# Patient Record
Sex: Male | Born: 1955 | Race: White | Hispanic: No | Marital: Single | Smoking: Former smoker
Health system: Southern US, Community
[De-identification: ages and names within clinical notes are randomized; demographics above are authoritative.]

## PROBLEM LIST (undated history)

## (undated) DIAGNOSIS — E785 Hyperlipidemia, unspecified: Secondary | ICD-10-CM

## (undated) DIAGNOSIS — I2510 Atherosclerotic heart disease of native coronary artery without angina pectoris: Secondary | ICD-10-CM

## (undated) DIAGNOSIS — I471 Supraventricular tachycardia, unspecified: Secondary | ICD-10-CM

## (undated) DIAGNOSIS — I10 Essential (primary) hypertension: Secondary | ICD-10-CM

## (undated) DIAGNOSIS — I456 Pre-excitation syndrome: Secondary | ICD-10-CM

## (undated) DIAGNOSIS — I4719 Other supraventricular tachycardia: Secondary | ICD-10-CM

## (undated) DIAGNOSIS — J9611 Chronic respiratory failure with hypoxia: Secondary | ICD-10-CM

## (undated) DIAGNOSIS — Z9981 Dependence on supplemental oxygen: Secondary | ICD-10-CM

## (undated) DIAGNOSIS — J84112 Idiopathic pulmonary fibrosis: Secondary | ICD-10-CM

## (undated) DIAGNOSIS — E039 Hypothyroidism, unspecified: Secondary | ICD-10-CM

## (undated) DIAGNOSIS — I4891 Unspecified atrial fibrillation: Secondary | ICD-10-CM

## (undated) DIAGNOSIS — G4730 Sleep apnea, unspecified: Secondary | ICD-10-CM

## (undated) DIAGNOSIS — I4892 Unspecified atrial flutter: Secondary | ICD-10-CM

## (undated) DIAGNOSIS — J439 Emphysema, unspecified: Secondary | ICD-10-CM

## (undated) DIAGNOSIS — N183 Chronic kidney disease, stage 3 unspecified: Secondary | ICD-10-CM

## (undated) DIAGNOSIS — J939 Pneumothorax, unspecified: Secondary | ICD-10-CM

## (undated) DIAGNOSIS — J449 Chronic obstructive pulmonary disease, unspecified: Secondary | ICD-10-CM

## (undated) DIAGNOSIS — E119 Type 2 diabetes mellitus without complications: Secondary | ICD-10-CM

## (undated) DIAGNOSIS — I5031 Acute diastolic (congestive) heart failure: Secondary | ICD-10-CM

## (undated) DIAGNOSIS — J8410 Pulmonary fibrosis, unspecified: Secondary | ICD-10-CM

## (undated) DIAGNOSIS — I429 Cardiomyopathy, unspecified: Secondary | ICD-10-CM

## (undated) HISTORY — DX: Hyperlipidemia, unspecified: E78.5

## (undated) HISTORY — DX: Essential (primary) hypertension: I10

## (undated) HISTORY — PX: SHOULDER ARTHROSCOPY W/ ROTATOR CUFF REPAIR: SHX2400

## (undated) HISTORY — DX: Chronic obstructive pulmonary disease, unspecified: J44.9

## (undated) HISTORY — PX: BILATERAL VATS ABLATION: SHX1224

## (undated) HISTORY — PX: CORONARY ANGIOPLASTY WITH STENT PLACEMENT: SHX49

## (undated) HISTORY — DX: Pulmonary fibrosis, unspecified: J84.10

## (undated) HISTORY — PX: LUNG LOBECTOMY: SHX167

## (undated) HISTORY — DX: Pneumothorax, unspecified: J93.9

## (undated) HISTORY — DX: Atherosclerotic heart disease of native coronary artery without angina pectoris: I25.10

---

## 2000-02-21 ENCOUNTER — Encounter: Admission: RE | Admit: 2000-02-21 | Discharge: 2000-05-21 | Payer: Self-pay | Admitting: Internal Medicine

## 2000-07-19 ENCOUNTER — Encounter: Admission: RE | Admit: 2000-07-19 | Discharge: 2000-07-19 | Payer: Self-pay | Admitting: Oral Surgery

## 2000-07-19 ENCOUNTER — Encounter: Payer: Self-pay | Admitting: Oral Surgery

## 2000-07-23 ENCOUNTER — Ambulatory Visit (HOSPITAL_BASED_OUTPATIENT_CLINIC_OR_DEPARTMENT_OTHER): Admission: RE | Admit: 2000-07-23 | Discharge: 2000-07-23 | Payer: Self-pay | Admitting: Oral Surgery

## 2000-09-03 ENCOUNTER — Encounter (INDEPENDENT_AMBULATORY_CARE_PROVIDER_SITE_OTHER): Payer: Self-pay | Admitting: *Deleted

## 2000-09-03 ENCOUNTER — Ambulatory Visit (HOSPITAL_BASED_OUTPATIENT_CLINIC_OR_DEPARTMENT_OTHER): Admission: RE | Admit: 2000-09-03 | Discharge: 2000-09-03 | Payer: Self-pay | Admitting: Oral Surgery

## 2000-11-02 ENCOUNTER — Emergency Department (HOSPITAL_COMMUNITY): Admission: EM | Admit: 2000-11-02 | Discharge: 2000-11-02 | Payer: Self-pay | Admitting: Emergency Medicine

## 2000-11-02 ENCOUNTER — Encounter: Payer: Self-pay | Admitting: Emergency Medicine

## 2003-03-04 ENCOUNTER — Inpatient Hospital Stay (HOSPITAL_COMMUNITY): Admission: EM | Admit: 2003-03-04 | Discharge: 2003-03-06 | Payer: Self-pay | Admitting: Emergency Medicine

## 2003-03-04 ENCOUNTER — Encounter: Payer: Self-pay | Admitting: Cardiology

## 2003-03-04 ENCOUNTER — Encounter: Payer: Self-pay | Admitting: Cardiovascular Disease

## 2004-06-03 ENCOUNTER — Ambulatory Visit (HOSPITAL_COMMUNITY): Admission: RE | Admit: 2004-06-03 | Discharge: 2004-06-03 | Payer: Self-pay | Admitting: Orthopedic Surgery

## 2007-05-03 HISTORY — PX: CARDIAC CATHETERIZATION: SHX172

## 2007-05-08 ENCOUNTER — Inpatient Hospital Stay (HOSPITAL_COMMUNITY): Admission: RE | Admit: 2007-05-08 | Discharge: 2007-05-09 | Payer: Self-pay | Admitting: Cardiovascular Disease

## 2007-05-27 ENCOUNTER — Ambulatory Visit: Payer: Self-pay | Admitting: Critical Care Medicine

## 2007-05-28 ENCOUNTER — Encounter: Payer: Self-pay | Admitting: Critical Care Medicine

## 2007-05-28 LAB — CONVERTED CEMR LAB: Anti Nuclear Antibody(ANA): NEGATIVE

## 2007-06-14 ENCOUNTER — Ambulatory Visit: Payer: Self-pay | Admitting: Critical Care Medicine

## 2007-11-01 ENCOUNTER — Encounter: Payer: Self-pay | Admitting: Critical Care Medicine

## 2008-08-11 ENCOUNTER — Ambulatory Visit (HOSPITAL_BASED_OUTPATIENT_CLINIC_OR_DEPARTMENT_OTHER): Admission: RE | Admit: 2008-08-11 | Discharge: 2008-08-11 | Payer: Self-pay | Admitting: Orthopedic Surgery

## 2009-01-22 ENCOUNTER — Emergency Department (HOSPITAL_COMMUNITY): Admission: EM | Admit: 2009-01-22 | Discharge: 2009-01-22 | Payer: Self-pay | Admitting: Family Medicine

## 2010-01-07 ENCOUNTER — Encounter: Admission: RE | Admit: 2010-01-07 | Discharge: 2010-01-07 | Payer: Self-pay | Admitting: Cardiovascular Disease

## 2010-01-13 ENCOUNTER — Observation Stay (HOSPITAL_COMMUNITY): Admission: RE | Admit: 2010-01-13 | Discharge: 2010-01-14 | Payer: Self-pay | Admitting: Cardiovascular Disease

## 2010-01-21 DIAGNOSIS — J939 Pneumothorax, unspecified: Secondary | ICD-10-CM

## 2010-01-21 HISTORY — DX: Pneumothorax, unspecified: J93.9

## 2010-02-11 ENCOUNTER — Inpatient Hospital Stay (HOSPITAL_COMMUNITY): Admission: EM | Admit: 2010-02-11 | Discharge: 2010-02-14 | Payer: Self-pay | Admitting: Emergency Medicine

## 2010-02-11 ENCOUNTER — Ambulatory Visit: Payer: Self-pay | Admitting: Critical Care Medicine

## 2010-02-11 ENCOUNTER — Ambulatory Visit: Payer: Self-pay | Admitting: Thoracic Surgery

## 2010-02-22 ENCOUNTER — Ambulatory Visit: Payer: Self-pay | Admitting: Thoracic Surgery

## 2010-02-22 ENCOUNTER — Encounter: Admission: RE | Admit: 2010-02-22 | Discharge: 2010-02-22 | Payer: Self-pay | Admitting: Thoracic Surgery

## 2010-02-22 DIAGNOSIS — J479 Bronchiectasis, uncomplicated: Secondary | ICD-10-CM

## 2010-02-22 DIAGNOSIS — I10 Essential (primary) hypertension: Secondary | ICD-10-CM

## 2010-02-22 DIAGNOSIS — J8410 Pulmonary fibrosis, unspecified: Secondary | ICD-10-CM | POA: Insufficient documentation

## 2010-02-22 DIAGNOSIS — E785 Hyperlipidemia, unspecified: Secondary | ICD-10-CM

## 2010-02-23 ENCOUNTER — Ambulatory Visit: Payer: Self-pay | Admitting: Internal Medicine

## 2010-02-23 DIAGNOSIS — E119 Type 2 diabetes mellitus without complications: Secondary | ICD-10-CM

## 2010-02-23 DIAGNOSIS — J449 Chronic obstructive pulmonary disease, unspecified: Secondary | ICD-10-CM

## 2010-03-06 ENCOUNTER — Inpatient Hospital Stay (HOSPITAL_COMMUNITY): Admission: EM | Admit: 2010-03-06 | Discharge: 2010-03-08 | Payer: Self-pay | Admitting: Emergency Medicine

## 2010-03-07 ENCOUNTER — Encounter (INDEPENDENT_AMBULATORY_CARE_PROVIDER_SITE_OTHER): Payer: Self-pay | Admitting: Internal Medicine

## 2010-03-13 ENCOUNTER — Ambulatory Visit: Payer: Self-pay | Admitting: Thoracic Surgery (Cardiothoracic Vascular Surgery)

## 2010-03-13 ENCOUNTER — Inpatient Hospital Stay (HOSPITAL_COMMUNITY): Admission: EM | Admit: 2010-03-13 | Discharge: 2010-03-22 | Payer: Self-pay | Admitting: Emergency Medicine

## 2010-03-15 ENCOUNTER — Encounter: Payer: Self-pay | Admitting: Thoracic Surgery

## 2010-03-25 ENCOUNTER — Ambulatory Visit: Payer: Self-pay | Admitting: Thoracic Surgery

## 2010-03-25 ENCOUNTER — Encounter: Payer: Self-pay | Admitting: Internal Medicine

## 2010-03-25 ENCOUNTER — Encounter: Admission: RE | Admit: 2010-03-25 | Discharge: 2010-03-25 | Payer: Self-pay | Admitting: Thoracic Surgery

## 2010-04-07 ENCOUNTER — Ambulatory Visit: Payer: Self-pay | Admitting: Internal Medicine

## 2010-04-15 ENCOUNTER — Encounter: Admission: RE | Admit: 2010-04-15 | Discharge: 2010-04-15 | Payer: Self-pay | Admitting: Thoracic Surgery

## 2010-04-15 ENCOUNTER — Ambulatory Visit: Payer: Self-pay | Admitting: Thoracic Surgery

## 2010-06-01 ENCOUNTER — Ambulatory Visit: Payer: Self-pay | Admitting: Thoracic Surgery

## 2010-06-01 ENCOUNTER — Encounter: Admission: RE | Admit: 2010-06-01 | Discharge: 2010-06-01 | Payer: Self-pay | Admitting: Thoracic Surgery

## 2010-06-01 ENCOUNTER — Encounter: Payer: Self-pay | Admitting: Internal Medicine

## 2010-06-08 ENCOUNTER — Ambulatory Visit: Payer: Self-pay | Admitting: Internal Medicine

## 2010-10-23 HISTORY — PX: KNEE SURGERY: SHX244

## 2010-11-13 ENCOUNTER — Encounter: Payer: Self-pay | Admitting: Thoracic Surgery

## 2010-11-24 NOTE — Letter (Signed)
Summary: [facility]  [facility]   Imported By: Lester Sandwich 06/15/2010 10:15:16  _____________________________________________________________________  External Attachment:    Type:   Image     Comment:   External Document

## 2010-11-24 NOTE — Letter (Signed)
Summary: [facility]  [facility]   Imported By: Lester Boyd 04/18/2010 10:57:31  _____________________________________________________________________  External Attachment:    Type:   Image     Comment:   External Document

## 2010-11-24 NOTE — Assessment & Plan Note (Signed)
Summary: Pulmonary/ f/u ov with walking sats = 86% p 3 laps   Visit Type:  Follow-up  CC:  Patient here for follow-up...no complaints today.Jerry Montgomery  History of Present Illness: 55 yowm quit smoking Nov 2009 with some am cough resolved and new R PTX 01/2010   April 07, 2010 Followup with PFT's. s/p admit  [facility] with PNA and recurrent  pneumothorax.  He states that his breathing has been doing fine since then but not back to baseline assoc with coughing fits and frequent need for albuterol assoc with mild hoarseness and sensation of congested throat with noisy wheezing while sleeping per wife on chronic ACE per [name].   rec  stop ramapril start losartan 100mg  one daily in place of ramipril use mucinex if needed for cough use albuterol if needed for short of breath  June 08, 2010 main issue now is energy, not using any albuterol.  cough resolved off ace.  Pt denies any significant sore throat, dysphagia, itching, sneezing,  nasal congestion or excess secretions,  fever, chills, sweats, unintended wt loss, pleuritic or exertional cp, hempoptysis, change in activity tolerance  orthopnea pnd or leg swelling.  Pt also denies any obvious fluctuation in symptoms with weather or environmental change or other alleviating or aggravating factors.        Current Medications (verified): 1)  Lantus 100 Unit/ml Soln (Insulin Glargine) .... 40 Units Subcutaneously At Bedtime 2)  Spiriva Handihaler 18 Mcg Caps (Tiotropium Bromide Monohydrate) .... Once Daily 3)  Aspir-Low 81 Mg Tbec (Aspirin) .... Once Daily 4)  Glyburide 5 Mg Tabs (Glyburide) .... Two Times A Day 5)  Isosorbide Mononitrate Cr 60 Mg Xr24h-Tab (Isosorbide Mononitrate) .... Once Daily 6)  Lipitor 40 Mg Tabs (Atorvastatin Calcium) .... Once Daily 7)  Metoprolol Succinate 200 Mg Xr24h-Tab (Metoprolol Succinate) .... Once Daily 8)  Niaspan 1000 Mg Cr-Tabs (Niacin (Antihyperlipidemic)) .... Once Daily 9)  Plavix 75 Mg Tabs (Clopidogrel Bisulfate) ....  Once Daily 10)  Metformin Hcl 500 Mg Tabs (Metformin Hcl) .... Two Times A Day 11)  Nitrostat 0.4 Mg Subl (Nitroglycerin) .... As Needed 12)  Proventil Hfa 108 (90 Base) Mcg/act Aers (Albuterol Sulfate) .... 2 Puffs Every 4 Hours 13)  Cozaar 100 Mg  Tabs (Losartan Potassium) .... One Tablet By Mouth Daily  Allergies (verified): 1)  ! Lortab  Past History:  Past Medical History: DIABETES MELLITUS, TYPE II (ICD-250.00) COPD (ICD-496)     - PFT's April 07, 2010 FEV1 2.56 (68%) ratio 72 no better B2, DLC0 45 corrects to 80%, flat insp loop     - desat p 3 laps June 08, 2010   HYPERTENSION (ICD-401.9)      - Try off ace April 07, 2010 due to pseudowheeze >  HYPERLIPIDEMIA (ICD-272.4) PULMONARY FIBROSIS (ICD-515) CAD (ICD-414.00)......................................Jerry KitchenNicholaus Bloom    - LHC 02/14/10 mild lv dysfunction > mutilvessel coronary artery disease with new stent cx R PTX  02/11/10    - CT chest 423/11 Upper lobe predominant subpleural honeycombing  with patch bilateral gg densities and rul bulla    - Mar 16 2010   Right video-assisted thoracoscopic surgery,   minithoracotomy, resection of apical bullae, pleurectomy, and   pleurodesis  >  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      Vital Signs:  Patient profile:   55 year old male Height:      74 inches (187.96 cm) Weight:      252 pounds (114.55 kg) BMI:     32.47 O2 Sat:  92 % on Room air Temp:     98.1 degrees F (36.72 degrees C) oral Pulse rate:   72 / minute BP sitting:   100 / 62  (left arm) Cuff size:   large  Vitals Entered By: Michel Bickers CMA (June 08, 2010 9:32 AM)  O2 Sat at Rest %:  92 O2 Flow:  Room air  Serial Vital Signs/Assessments:  Comments: 9:57 AM Ambulatory Pulse Oximetry  Resting; HR_47___    02 Sat___90% on room air__  Lap1 (185 feet)   HR_97____   02 Sat__90% on room air___ Lap2 (185 feet)   HR__85___   02 Sat__89% on room air___    Lap3 (185 feet)   HR__103___   02 Sat__86%  on room air___  _x__Test Completed without Difficulty  patient did not feel short of breath, but sats dropped to 86% on room sir after walking 3 laps.[name] placed on 2 liters of oxygen and his sats recovered to 93% and pulse was 55. ___Test Stopped due to:  By: Michel Bickers CMA   CC: Patient here for follow-up...no complaints today. Comments Medications reviewed. Daytime phone verified. Michel Bickers [facility]  June 08, 2010 9:33 AM   Physical Exam  Additional Exam:  wt 259 Feb 23, 2010 > 248 April 07, 2010 > 252 June 08, 2010   amb wm no longer  pseudowheeze  HEENT mild turbinate edema.  Oropharynx no thrush or excess pnd or cobblestoning.  No JVD or cervical adenopathy. Mild accessory muscle hypertrophy. Trachea midline, nl thryroid. Chest was hyperinflated by percussion with diminished breath sounds and moderate increased exp time without wheeze. Hoover sign positive at mid inspiration. Regular rate and rhythm without murmur gallop or rub or increase P2 or edema.  Abd: no hsm, nl excursion. Ext warm without cyanosis or clubbing.     CXR  Procedure date:  06/01/2010  Findings:      Stable chronic interstitial lung disease. No active process   Impression & Recommendations:  Problem # 1:  COPD (ICD-496) Barely detectable copd changes on pfts doubt needs any maint pulmonary rx - try off spiriva, the reverse of a therapeutic trial  From this point onward his prognosis will likely be determined by whether he smokes or not,  not what meds he takes or doctors he sees, so pulmonary f/u can be as needed increase symptoms  Problem # 2:  HYPERTENSION (ICD-401.9)  over treated on 100 of cozaar, try one half  His updated medication list for this problem includes:    Metoprolol Succinate 200 Mg Xr24h-tab (Metoprolol succinate) ..... Once daily    Cozaar 100 Mg Tabs (Losartan potassium) ..... One tablet by mouth daily  Many of his symptoms improved off ACE.   ACE inhibitors are  problematic in  pts with airway complaints because  even experienced pulmonologists can't always distinguish ace effects from copd/asthma.  By themselves they don't actually cause a problem, much like oxygen can't by itself start a fire, but they certainly serve as a powerful catalyst or enhancer for any "fire"  or inflammatory process in the upper airway, be it caused by an ET  tube or more commonly reflux (especially in the obese or pts with known GERD or who are on biphoshonates).  In his case I would avoid ACE indefinitely based on how much better he's doing off them and how much his copd symptoms overlap with ace effects  Orders: Est. Patient Level IV (95621)  Problem # 3:  PULMONARY FIBROSIS (ICD-515) Desats p 3 laps but not symptoms, no need for rx unless becomes symptomatic- can't rule out early form of IPF here but more than likely this was smoking related PF which should not progress and for which there is no early opportunity for management other than to reinforce smoking cessation  Medications Added to Medication List This Visit: 1)  Proventil Hfa 108 (90 Base) Mcg/act Aers (Albuterol sulfate) .... 2 puffs every 4 hours as needed  Complete Medication List: 1)  Lantus 100 Unit/ml Soln (Insulin glargine) .... 40 units subcutaneously at bedtime 2)  Spiriva Handihaler 18 Mcg Caps (Tiotropium bromide monohydrate) .... Once daily 3)  Aspir-low 81 Mg Tbec (Aspirin) .... Once daily 4)  Glyburide 5 Mg Tabs (Glyburide) .... Two times a day 5)  Isosorbide Mononitrate Cr 60 Mg Xr24h-tab (Isosorbide mononitrate) .... Once daily 6)  Lipitor 40 Mg Tabs (Atorvastatin calcium) .... Once daily 7)  Metoprolol Succinate 200 Mg Xr24h-tab (Metoprolol succinate) .... Once daily 8)  Niaspan 1000 Mg Cr-tabs (Niacin (antihyperlipidemic)) .... Once daily 9)  Plavix 75 Mg Tabs (Clopidogrel bisulfate) .... Once daily 10)  Metformin Hcl 500 Mg Tabs (Metformin hcl) .... Two times a day 11)  Cozaar 100 Mg Tabs  (Losartan potassium) .... One tablet by mouth daily 12)  Nitrostat 0.4 Mg Subl (Nitroglycerin) .... As needed 13)  Proventil Hfa 108 (90 Base) Mcg/act Aers (Albuterol sulfate) .... 2 puffs every 4 hours as needed  Other Orders: Est. Patient Level III (04540) Pulse Oximetry, Ambulatory (98119)  Patient Instructions: 1)  I think of spiriva in this setting like purchasing high octane fuel for an older car with lots of miles on it. It may help the perfomance enough to warrant the purchase, but it won't change the longevity of the car or make it any easier parking it. It should improve peak performance - try off spiriva to see if it makes a difference  2)   losartan 100mg  one half daily 3)  use  mucinex if needed for cough 4)  use albuterol if needed for short of breath 5)  Return here if needed for any worsening short of breath

## 2010-11-24 NOTE — Miscellaneous (Signed)
Summary: Orders Update pft charges  Clinical Lists Changes  Orders: Added new Service order of Carbon Monoxide diffusing w/capacity (94720) - Signed Added new Service order of Lung Volumes (94240) - Signed Added new Service order of Spirometry (Pre & Post) (94060) - Signed 

## 2010-11-24 NOTE — Assessment & Plan Note (Signed)
Summary: Pulmonary/ ext post op f/u ov - try off ace    CC:  Followup with PFT's.  Pt states that since last seen he was admitted to [facility] with PNA and pneumothorax.  He states that his breathing has been doing fine since then.  No complaints today.Marland Kitchen  History of Present Illness: 59 yowm quit smoking Nov 2009 with some am cough resolved and new R PTX 01/2010   April 07, 2010 Followup with PFT's. s/p admit  [facility] with PNA and recurrent  pneumothorax.  He states that his breathing has been doing fine since then but not back to baseline assoc with coughing fits and frequent need for albuterol assoc with mild hoarseness and sensation of congested throat with noisy wheezing while sleeping per wife on chronic ACE per [name].  Pt denies any significant sore throat, dysphagia, itching, sneezing,  nasal congestion or excess secretions,  fever, chills, sweats, unintended wt loss, pleuritic or exertional cp, hempoptysis, change in poor activity tolerance  orthopnea pnd or leg swelling. Pt also denies any obvious fluctuation in symptoms with weather or environmental change or other alleviating or aggravating factors.       Current Medications (verified): 1)  Lantus 100 Unit/ml Soln (Insulin Glargine) .... 40 Units Subcutaneously At Bedtime 2)  Spiriva Handihaler 18 Mcg Caps (Tiotropium Bromide Monohydrate) .... Once Daily 3)  Aspir-Low 81 Mg Tbec (Aspirin) .... Once Daily 4)  Fish Oil 1000 Mg Caps (Omega-3 Fatty Acids) .... Once Daily 5)  Glyburide 5 Mg Tabs (Glyburide) .... Two Times A Day 6)  Isosorbide Mononitrate Cr 60 Mg Xr24h-Tab (Isosorbide Mononitrate) .... Once Daily 7)  Lipitor 40 Mg Tabs (Atorvastatin Calcium) .... Once Daily 8)  Metoprolol Succinate 200 Mg Xr24h-Tab (Metoprolol Succinate) .... Once Daily 9)  Niaspan 1000 Mg Cr-Tabs (Niacin (Antihyperlipidemic)) .... Once Daily 10)  Plavix 75 Mg Tabs (Clopidogrel Bisulfate) .... Once Daily 11)  Ramipril 10 Mg Caps (Ramipril) .... Once Daily 12)   Metformin Hcl 500 Mg Tabs (Metformin Hcl) .... Two Times A Day 13)  Nitrostat 0.4 Mg Subl (Nitroglycerin) .... As Needed 14)  Proventil Hfa 108 (90 Base) Mcg/act Aers (Albuterol Sulfate) .... 2 Puffs Every 4 Hours  Allergies (verified): 1)  ! Lortab  Past History:  Past Medical History: DIABETES MELLITUS, TYPE II (ICD-250.00) COPD (ICD-496)     - PFT's April 07, 2010 FEV1 2.56 (68%) ratio 72 no better B2, DLC0 45 corrects to 80%, flat insp loop  HYPERTENSION (ICD-401.9)      - Try off ace April 07, 2010 due to pseudowheeze  HYPERLIPIDEMIA (ICD-272.4) PULMONARY FIBROSIS (ICD-515) CAD (ICD-414.00)......................................Marland KitchenNicholaus Bloom    - LHC 02/14/10 mild lv dysfunction > mutilvessel coronary artery disease with new stent cx R PTX  02/11/10    - CT chest 423/11 Upper lobe predominant subpleural honeycombing  with patch bilateral gg densities and rul bulla    - Mar 16 2010   Right video-assisted thoracoscopic surgery,   minithoracotomy, resection of apical bullae, pleurectomy, and   pleurodesis.      Past Surgical History: 4 stents right elbow surgery 2 rotator cuff repair Mar 16 2010   Right video-assisted thoracoscopic surgery,   minithoracotomy, resection of apical bullae, pleurectomy, and   pleurodesis.      Vital Signs:  Patient profile:   55 year old male Weight:      248 pounds O2 Sat:      94 % on Room air Temp:     97.4 degrees F oral Pulse  rate:   46 / minute BP sitting:   110 / 68  (left arm)  Vitals Entered By: Vernie Murders (April 07, 2010 11:45 AM)  O2 Flow:  Room air  Physical Exam  Additional Exam:  wt 259 Feb 23, 2010 > 248 April 07, 2010 hoarse amb wm with classic pseudowheeze resolves with purse lip maneuver  HEENT mild turbinate edema.  Oropharynx no thrush or excess pnd or cobblestoning.  No JVD or cervical adenopathy. Mild accessory muscle hypertrophy. Trachea midline, nl thryroid. Chest was hyperinflated by percussion with diminished breath  sounds and moderate increased exp time without wheeze. Hoover sign positive at mid inspiration. Regular rate and rhythm without murmur gallop or rub or increase P2 or edema.  Abd: no hsm, nl excursion. Ext warm without cyanosis or clubbing.     Impression & Recommendations:  Problem # 1:  COPD (ICD-496) Barely detectable copd changes on pfts are markedly disproportionate to symptoms and need for multiple pulmonary meds    DDX of  difficult airways managment all start with A and  include Adherence, Ace Inhibitors, Acid Reflux, Active Sinus Disease, Alpha 1 Antitripsin deficiency, Anxiety masquerading as Airways dz,  ABPA,  allergy(esp in young), Aspiration (esp in elderly), Adverse effects of DPI,  Active smokers, plus one B  = Beta blocker use..   Ace inhibitors or Acid reflux leading suspects here based on insp portion of FV loop and pseudowheeze on exam  First step is try off ace, then regroup ( may not even need spiriva)  Problem # 2:  HYPERTENSION (ICD-401.9)  The following medications were removed from the medication list:    Furosemide 20 Mg Tabs (Furosemide) ..... Once daily    Ramipril 10 Mg Caps (Ramipril) ..... Once daily His updated medication list for this problem includes:    Metoprolol Succinate 200 Mg Xr24h-tab (Metoprolol succinate) ..... Once daily    Cozaar 100 Mg Tabs (Losartan potassium) ..... One tablet by mouth daily   ACE inhibitors are problematic in  pts with airway complaints because  even experienced pulmonologists can't always distinguish ace effects from copd/asthma.  By themselves they don't actually cause a problem, much like oxygen can't by itself start a fire, but they certainly serve as a powerful catalyst or enhancer for any "fire"  or inflammatory process in the upper airway, be it caused by an ET  tube or more commonly reflux (especially in the obese or pts with known GERD or who are on biphoshonates).  In the era of ARB near equivalency until we have a  better handle on the reversibility of the airway problem, it just makes sense to avoid ace entirely in the short run and then decide later, having established a level of airway control using a reasonable limited regimen, whether to add back ace but even then being very careful to observe the pt for worsening airway control and number of meds used/ needed to control symptoms.    Orders: Est. Patient Level IV (16109)  Medications Added to Medication List This Visit: 1)  Proventil Hfa 108 (90 Base) Mcg/act Aers (Albuterol sulfate) .... 2 puffs every 4 hours 2)  Cozaar 100 Mg Tabs (Losartan potassium) .... One tablet by mouth daily  Patient Instructions: 1)  I think of spiriva in this setting like purchasing high octane fuel for an older car with lots of miles on it. It may help the perfomance enough to warrant the purchase, but it won't change the longevity of the car  or make it any easier parking it. It should improve peak performance 2)  stop ramapril 3)  start losartan 100mg  one daily in place of ramipril 4)  use mucinex if needed for cough 5)  use albuterol if needed for short of breath Prescriptions: COZAAR 100 MG  TABS (LOSARTAN POTASSIUM) One tablet by mouth daily  #34 x 11   Entered and Authorized by:   Nyoka Cowden MD   Signed by:   Nyoka Cowden MD on 04/07/2010   Method used:   Electronically to        [facility] [phone]* (retail)       [address]       [place], [place]  [address]       Ph: [phone]       Fax: [phone]   RxID:   [id number]

## 2010-11-24 NOTE — Assessment & Plan Note (Signed)
Summary: Pulmonary/ ext post hosp f/u     CC:  HFU.  Pt states breathing is doing "fine" since discharge.  Denies wheezing and chest tightness and SOB.  States he does have a dry cough..  History of Present Illness: 57 yowm quit smoking Nov 2009 with some am cough resolved and new R PTX 01/2010  Feb 23, 2010 cc  "fine" since discharge.  Denies wheezing, chest tightness and SOB.  States he does have a dry cough. R PTX treated and released by [facility] after May 31st f/u  no limitations, no cough. Pt denies any significant sore throat, dysphagia, itching, sneezing,  nasal congestion or excess secretions,  fever, chills, sweats, unintended wt loss, pleuritic or exertional cp, hempoptysis, change in activity tolerance  orthopnea pnd or leg swelling Pt also denies any obvious fluctuation in symptoms with weather or environmental change or other alleviating or aggravating factors.        Preventive Screening-Counseling & Management  Alcohol-Tobacco     Smoking Status: quit  Current Medications (verified): 1)  Lantus 100 Unit/ml Soln (Insulin Glargine) .... 40 Units Subcutaneously At Bedtime 2)  Spiriva Handihaler 18 Mcg Caps (Tiotropium Bromide Monohydrate) .... Once Daily 3)  Actos 30 Mg Tabs (Pioglitazone Hcl) .... Once Daily 4)  Aspir-Low 81 Mg Tbec (Aspirin) .... Once Daily 5)  Fish Oil 1000 Mg Caps (Omega-3 Fatty Acids) .... Once Daily 6)  Furosemide 20 Mg Tabs (Furosemide) .... Once Daily 7)  Glyburide 5 Mg Tabs (Glyburide) .... Two Times A Day 8)  Isosorbide Mononitrate Cr 60 Mg Xr24h-Tab (Isosorbide Mononitrate) .... Once Daily 9)  Lipitor 40 Mg Tabs (Atorvastatin Calcium) .... Once Daily 10)  Metoprolol Succinate 200 Mg Xr24h-Tab (Metoprolol Succinate) .... Once Daily 11)  Niaspan 1000 Mg Cr-Tabs (Niacin (Antihyperlipidemic)) .... Once Daily 12)  Nitrostat 0.4 Mg Subl (Nitroglycerin) .... As Needed 13)  Plavix 75 Mg Tabs (Clopidogrel Bisulfate) .... Once Daily 14)  Ramipril 10 Mg Caps  (Ramipril) .... Once Daily 15)  Tramadol Hcl 50 Mg Tabs (Tramadol Hcl) .... Every 6 Hours As Needed 16)  Metformin Hcl 500 Mg Tabs (Metformin Hcl) .... Two Times A Day  Allergies (verified): No Known Drug Allergies  Past History:  Past Medical History: DIABETES MELLITUS, TYPE II (ICD-250.00) COPD (ICD-496)  HYPERTENSION (ICD-401.9) HYPERLIPIDEMIA (ICD-272.4) PULMONARY FIBROSIS (ICD-515) CAD (ICD-414.00)......................................Marland KitchenNicholaus Bloom    - LHC 02/14/10 mild lv dysfunction > mutilvessel coronary artery disease with new stent cx R PTX  02/11/10    - CT chest 423/11 Upper lobe predominant subpleural honeycombing  with patch bilateral gg densities and rul bulla  Past Surgical History: 4 stents right elbow surgery 2 rotator cuff repair  Family History: breast cancer - mother father deceased from lung ca DM - mother  Social History: Patient states former smoker.  Quit 2009.  1 1/2 ppd x 35 yrs alcohol socially [employer] Married 1 daughter living, 1 son deceasedSmoking Status:  quit  Vital Signs:  Patient profile:   55 year old male Height:      74 inches Weight:      259 pounds BMI:     33.37 O2 Sat:      93 % on Room air Temp:     97.5 degrees F oral Pulse rate:   56 / minute BP sitting:   94 / 60  (left arm) Cuff size:   large  Vitals Entered By: Gweneth Dimitri RN (Feb 23, 2010 11:10 AM)  O2 Flow:  Room air  CC: HFU.  Pt states breathing is doing "fine" since discharge.  Denies wheezing, chest tightness and SOB.  States he does have a dry cough. Comments Medications reviewed with patient Daytime contact number verified with patient. Gweneth Dimitri RN  Feb 23, 2010 11:11 AM    Physical Exam  Additional Exam:  wt 259 Feb 23, 2010  HEENT mild turbinate edema.  Oropharynx no thrush or excess pnd or cobblestoning.  No JVD or cervical adenopathy. Mild accessory muscle hypertrophy. Trachea midline, nl thryroid. Chest was hyperinflated by percussion with  diminished breath sounds and moderate increased exp time without wheeze. Hoover sign positive at mid inspiration. Regular rate and rhythm without murmur gallop or rub or increase P2 or edema.  Abd: no hsm, nl excursion. Ext warm without cyanosis or clubbing.     Impression & Recommendations:  Problem # 1:  COPD (ICD-496) Rx is based on symptoms and possible need to control exac and tendency to recurrent ptx; spiriva adequate for this  I spent extra time with the patient today explaining optimal dpi  technique.  This improved from 75 -90%   Problem # 2:  PULMONARY FIBROSIS (ICD-515) No evidence bronchiectasis by the only ct scan he has on record in e chart  DDx for pulmonary fibrosis with honeycombing includes idiopathic pulmonary fibrosis, pulmonary fibrosis associated with rheumatologic disease, adverse effect from  drugs such as chemotherapy or amiodarone exposure, nonspecific interstitial pneumonia which is typically steroid responsive, and chronic hypersensitivity pneumonitis.   Needs f/u pft's and careful serial f/u  Discussed in detail all the  indications, usual  risks and alternatives  relative to the benefits with patient who agrees to proceed with serial f/u  Medications Added to Medication List This Visit: 1)  Lantus 100 Unit/ml Soln (Insulin glargine) .... 40 units subcutaneously at bedtime 2)  Spiriva Handihaler 18 Mcg Caps (Tiotropium bromide monohydrate) .... Once daily 3)  Actos 30 Mg Tabs (Pioglitazone hcl) .... Once daily 4)  Aspir-low 81 Mg Tbec (Aspirin) .... Once daily 5)  Fish Oil 1000 Mg Caps (Omega-3 fatty acids) .... Once daily 6)  Furosemide 20 Mg Tabs (Furosemide) .... Once daily 7)  Glyburide 5 Mg Tabs (Glyburide) .... Two times a day 8)  Isosorbide Mononitrate Cr 60 Mg Xr24h-tab (Isosorbide mononitrate) .... Once daily 9)  Lipitor 40 Mg Tabs (Atorvastatin calcium) .... Once daily 10)  Metoprolol Succinate 200 Mg Xr24h-tab (Metoprolol succinate) .... Once  daily 11)  Niaspan 1000 Mg Cr-tabs (Niacin (antihyperlipidemic)) .... Once daily 12)  Plavix 75 Mg Tabs (Clopidogrel bisulfate) .... Once daily 13)  Ramipril 10 Mg Caps (Ramipril) .... Once daily 14)  Metformin Hcl 500 Mg Tabs (Metformin hcl) .... Two times a day 15)  Tramadol Hcl 50 Mg Tabs (Tramadol hcl) .... Every 6 hours as needed 16)  Nitrostat 0.4 Mg Subl (Nitroglycerin) .... As needed  Other Orders: Est. Patient Level IV (16109) HFA Instruction [phone])  Patient Instructions: 1)  Please schedule a follow-up appointment in 6 weeks, sooner if needed with PFT's  Prescriptions: SPIRIVA HANDIHALER 18 MCG CAPS (TIOTROPIUM BROMIDE MONOHYDRATE) once daily  #90 x 3   Entered and Authorized by:   Nyoka Cowden MD   Signed by:   Nyoka Cowden MD on 02/23/2010   Method used:   Print then Give to Patient   RxID:   [id number]

## 2010-11-28 ENCOUNTER — Encounter: Payer: Self-pay | Admitting: Critical Care Medicine

## 2011-01-09 LAB — PROTIME-INR
INR: 1.08 (ref 0.00–1.49)
Prothrombin Time: 13.9 seconds (ref 11.6–15.2)

## 2011-01-09 LAB — BASIC METABOLIC PANEL WITH GFR
BUN: 10 mg/dL (ref 6–23)
BUN: 15 mg/dL (ref 6–23)
CO2: 25 meq/L (ref 19–32)
CO2: 25 meq/L (ref 19–32)
Calcium: 7.9 mg/dL — ABNORMAL LOW (ref 8.4–10.5)
Calcium: 8.6 mg/dL (ref 8.4–10.5)
Chloride: 103 meq/L (ref 96–112)
Chloride: 105 meq/L (ref 96–112)
Creatinine, Ser: 1.04 mg/dL (ref 0.4–1.5)
Creatinine, Ser: 1.12 mg/dL (ref 0.4–1.5)
GFR calc non Af Amer: >60 mL/min
GFR calc non Af Amer: >60 mL/min
Glucose, Bld: 107 mg/dL — ABNORMAL HIGH (ref 70–99)
Glucose, Bld: 161 mg/dL — ABNORMAL HIGH (ref 70–99)
Potassium: 3.3 meq/L — ABNORMAL LOW (ref 3.5–5.1)
Potassium: 4.3 meq/L (ref 3.5–5.1)
Sodium: 133 meq/L — ABNORMAL LOW (ref 135–145)
Sodium: 140 meq/L (ref 135–145)

## 2011-01-09 LAB — GLUCOSE, CAPILLARY
Glucose-Capillary: 106 mg/dL — ABNORMAL HIGH (ref 70–99)
Glucose-Capillary: 107 mg/dL — ABNORMAL HIGH (ref 70–99)
Glucose-Capillary: 112 mg/dL — ABNORMAL HIGH (ref 70–99)
Glucose-Capillary: 113 mg/dL — ABNORMAL HIGH (ref 70–99)
Glucose-Capillary: 148 mg/dL — ABNORMAL HIGH (ref 70–99)
Glucose-Capillary: 148 mg/dL — ABNORMAL HIGH (ref 70–99)
Glucose-Capillary: 150 mg/dL — ABNORMAL HIGH (ref 70–99)
Glucose-Capillary: 158 mg/dL — ABNORMAL HIGH (ref 70–99)
Glucose-Capillary: 168 mg/dL — ABNORMAL HIGH (ref 70–99)
Glucose-Capillary: 170 mg/dL — ABNORMAL HIGH (ref 70–99)
Glucose-Capillary: 175 mg/dL — ABNORMAL HIGH (ref 70–99)
Glucose-Capillary: 184 mg/dL — ABNORMAL HIGH (ref 70–99)
Glucose-Capillary: 186 mg/dL — ABNORMAL HIGH (ref 70–99)
Glucose-Capillary: 187 mg/dL — ABNORMAL HIGH (ref 70–99)
Glucose-Capillary: 190 mg/dL — ABNORMAL HIGH (ref 70–99)
Glucose-Capillary: 192 mg/dL — ABNORMAL HIGH (ref 70–99)
Glucose-Capillary: 196 mg/dL — ABNORMAL HIGH (ref 70–99)
Glucose-Capillary: 197 mg/dL — ABNORMAL HIGH (ref 70–99)
Glucose-Capillary: 198 mg/dL — ABNORMAL HIGH (ref 70–99)
Glucose-Capillary: 199 mg/dL — ABNORMAL HIGH (ref 70–99)
Glucose-Capillary: 200 mg/dL — ABNORMAL HIGH (ref 70–99)
Glucose-Capillary: 204 mg/dL — ABNORMAL HIGH (ref 70–99)
Glucose-Capillary: 221 mg/dL — ABNORMAL HIGH (ref 70–99)
Glucose-Capillary: 229 mg/dL — ABNORMAL HIGH (ref 70–99)
Glucose-Capillary: 244 mg/dL — ABNORMAL HIGH (ref 70–99)
Glucose-Capillary: 258 mg/dL — ABNORMAL HIGH (ref 70–99)
Glucose-Capillary: 307 mg/dL — ABNORMAL HIGH (ref 70–99)

## 2011-01-09 LAB — CBC
HCT: 30.4 % — ABNORMAL LOW (ref 39.0–52.0)
HCT: 36.8 % — ABNORMAL LOW (ref 39.0–52.0)
HCT: 40 % (ref 39.0–52.0)
HCT: 40 % (ref 39.0–52.0)
HCT: 40.3 % (ref 39.0–52.0)
Hemoglobin: 10.4 g/dL — ABNORMAL LOW (ref 13.0–17.0)
Hemoglobin: 10.8 g/dL — ABNORMAL LOW (ref 13.0–17.0)
Hemoglobin: 11.7 g/dL — ABNORMAL LOW (ref 13.0–17.0)
Hemoglobin: 12.7 g/dL — ABNORMAL LOW (ref 13.0–17.0)
Hemoglobin: 13.9 g/dL (ref 13.0–17.0)
Hemoglobin: 14.1 g/dL (ref 13.0–17.0)
MCHC: 34.3 g/dL (ref 30.0–36.0)
MCHC: 34.5 g/dL (ref 30.0–36.0)
MCHC: 34.5 g/dL (ref 30.0–36.0)
MCHC: 34.6 g/dL (ref 30.0–36.0)
MCHC: 34.8 g/dL (ref 30.0–36.0)
MCHC: 35 g/dL (ref 30.0–36.0)
MCV: 92.7 fL (ref 78.0–100.0)
MCV: 92.8 fL (ref 78.0–100.0)
MCV: 93.1 fL (ref 78.0–100.0)
MCV: 93.3 fL (ref 78.0–100.0)
MCV: 93.8 fL (ref 78.0–100.0)
MCV: 94 fL (ref 78.0–100.0)
Platelets: 138 K/uL — ABNORMAL LOW (ref 150–400)
Platelets: 139 10*3/uL — ABNORMAL LOW (ref 150–400)
Platelets: 139 10*3/uL — ABNORMAL LOW (ref 150–400)
Platelets: 139 K/uL — ABNORMAL LOW (ref 150–400)
Platelets: 146 10*3/uL — ABNORMAL LOW (ref 150–400)
Platelets: 210 10*3/uL (ref 150–400)
RBC: 3.26 MIL/uL — ABNORMAL LOW (ref 4.22–5.81)
RBC: 3.36 MIL/uL — ABNORMAL LOW (ref 4.22–5.81)
RBC: 3.97 MIL/uL — ABNORMAL LOW (ref 4.22–5.81)
RBC: 4.26 MIL/uL (ref 4.22–5.81)
RBC: 4.3 MIL/uL (ref 4.22–5.81)
RBC: 4.35 MIL/uL (ref 4.22–5.81)
RBC: 4.37 MIL/uL (ref 4.22–5.81)
RDW: 12.6 % (ref 11.5–15.5)
RDW: 12.7 % (ref 11.5–15.5)
RDW: 12.9 % (ref 11.5–15.5)
RDW: 13.2 % (ref 11.5–15.5)
WBC: 4 10*3/uL (ref 4.0–10.5)
WBC: 5.7 10*3/uL (ref 4.0–10.5)
WBC: 5.9 10*3/uL (ref 4.0–10.5)
WBC: 5.9 10*3/uL (ref 4.0–10.5)
WBC: 6.2 K/uL (ref 4.0–10.5)
WBC: 7.4 10*3/uL (ref 4.0–10.5)
WBC: 8.3 10*3/uL (ref 4.0–10.5)
WBC: 9.4 K/uL (ref 4.0–10.5)

## 2011-01-09 LAB — CK TOTAL AND CKMB (NOT AT ARMC)
CK, MB: 5.7 ng/mL — ABNORMAL HIGH (ref 0.3–4.0)
CK, MB: 6.7 ng/mL (ref 0.3–4.0)
Total CK: 569 U/L — ABNORMAL HIGH (ref 7–232)

## 2011-01-09 LAB — TISSUE CULTURE

## 2011-01-09 LAB — COMPREHENSIVE METABOLIC PANEL
ALT: 22 U/L (ref 0–53)
ALT: 30 U/L (ref 0–53)
AST: 25 U/L (ref 0–37)
Albumin: 3.4 g/dL — ABNORMAL LOW (ref 3.5–5.2)
Alkaline Phosphatase: 37 U/L — ABNORMAL LOW (ref 39–117)
Alkaline Phosphatase: 42 U/L (ref 39–117)
CO2: 26 mEq/L (ref 19–32)
CO2: 27 mEq/L (ref 19–32)
Chloride: 104 mEq/L (ref 96–112)
Chloride: 108 mEq/L (ref 96–112)
Creatinine, Ser: 1.25 mg/dL (ref 0.4–1.5)
GFR calc Af Amer: >60 mL/min (ref >60)
GFR calc non Af Amer: 52 mL/min — ABNORMAL LOW (ref >60)
GFR calc non Af Amer: 60 mL/min — ABNORMAL LOW (ref >60)
Glucose, Bld: 178 mg/dL — ABNORMAL HIGH (ref 70–99)
Potassium: 3.8 mEq/L (ref 3.5–5.1)
Potassium: 4.1 mEq/L (ref 3.5–5.1)
Sodium: 133 mEq/L — ABNORMAL LOW (ref 135–145)
Sodium: 143 mEq/L (ref 135–145)
Total Bilirubin: 0.9 mg/dL (ref 0.3–1.2)
Total Bilirubin: 1.2 mg/dL (ref 0.3–1.2)

## 2011-01-09 LAB — CULTURE, BLOOD (ROUTINE X 2): Culture: NO GROWTH

## 2011-01-09 LAB — DIFFERENTIAL
Basophils Absolute: 0 10*3/uL (ref 0.0–0.1)
Basophils Absolute: 0 10*3/uL (ref 0.0–0.1)
Basophils Relative: 0 % (ref 0–1)
Eosinophils Absolute: 0.2 10*3/uL (ref 0.0–0.7)
Eosinophils Relative: 2 % (ref 0–5)
Eosinophils Relative: 3 % (ref 0–5)
Lymphocytes Relative: 24 % (ref 12–46)
Lymphocytes Relative: 25 % (ref 12–46)
Lymphs Abs: 1.4 10*3/uL (ref 0.7–4.0)
Lymphs Abs: 1.4 10*3/uL (ref 0.7–4.0)
Monocytes Absolute: 0.8 10*3/uL (ref 0.1–1.0)
Monocytes Relative: 13 % — ABNORMAL HIGH (ref 3–12)
Monocytes Relative: 14 % — ABNORMAL HIGH (ref 3–12)
Neutro Abs: 3.4 10*3/uL (ref 1.7–7.7)
Neutrophils Relative %: 59 % (ref 43–77)
Neutrophils Relative %: 69 % (ref 43–77)

## 2011-01-09 LAB — CULTURE, RESPIRATORY W GRAM STAIN

## 2011-01-09 LAB — BASIC METABOLIC PANEL
BUN: 14 mg/dL (ref 6–23)
BUN: 14 mg/dL (ref 6–23)
BUN: 16 mg/dL (ref 6–23)
BUN: 20 mg/dL (ref 6–23)
BUN: 26 mg/dL — ABNORMAL HIGH (ref 6–23)
BUN: 9 mg/dL (ref 6–23)
CO2: 25 mEq/L (ref 19–32)
CO2: 25 mEq/L (ref 19–32)
Calcium: 7.8 mg/dL — ABNORMAL LOW (ref 8.4–10.5)
Calcium: 8.2 mg/dL — ABNORMAL LOW (ref 8.4–10.5)
Calcium: 8.9 mg/dL (ref 8.4–10.5)
Calcium: 9.3 mg/dL (ref 8.4–10.5)
Chloride: 103 mEq/L (ref 96–112)
Chloride: 107 mEq/L (ref 96–112)
Chloride: 108 mEq/L (ref 96–112)
Creatinine, Ser: 1.04 mg/dL (ref 0.4–1.5)
Creatinine, Ser: 1.26 mg/dL (ref 0.4–1.5)
Creatinine, Ser: 1.47 mg/dL (ref 0.4–1.5)
GFR calc Af Amer: >60 mL/min (ref >60)
GFR calc Af Amer: >60 mL/min (ref >60)
GFR calc non Af Amer: 44 mL/min — ABNORMAL LOW (ref >60)
GFR calc non Af Amer: 59 mL/min — ABNORMAL LOW (ref >60)
GFR calc non Af Amer: >60 mL/min (ref >60)
GFR calc non Af Amer: >60 mL/min (ref >60)
GFR calc non Af Amer: >60 mL/min (ref >60)
Glucose, Bld: 118 mg/dL — ABNORMAL HIGH (ref 70–99)
Glucose, Bld: 193 mg/dL — ABNORMAL HIGH (ref 70–99)
Glucose, Bld: 218 mg/dL — ABNORMAL HIGH (ref 70–99)
Potassium: 3.9 mEq/L (ref 3.5–5.1)
Potassium: 4.1 mEq/L (ref 3.5–5.1)
Potassium: 4.2 mEq/L (ref 3.5–5.1)
Sodium: 137 mEq/L (ref 135–145)
Sodium: 137 mEq/L (ref 135–145)

## 2011-01-09 LAB — TSH: TSH: 4.964 u[IU]/mL — ABNORMAL HIGH (ref 0.350–4.500)

## 2011-01-09 LAB — POCT I-STAT 3, ART BLOOD GAS (G3+)
Acid-base deficit: 1 mmol/L (ref 0.0–2.0)
Bicarbonate: 23.6 meq/L (ref 20.0–24.0)
O2 Saturation: 87 %
Patient temperature: 100.2
TCO2: 25 mmol/L (ref 0–100)
pCO2 arterial: 41.8 mmHg (ref 35.0–45.0)
pH, Arterial: 7.364 (ref 7.350–7.450)
pO2, Arterial: 58 mmHg — ABNORMAL LOW (ref 80.0–100.0)

## 2011-01-09 LAB — CARDIAC PANEL(CRET KIN+CKTOT+MB+TROPI)
Relative Index: 0.9 (ref 0.0–2.5)
Total CK: 634 U/L — ABNORMAL HIGH (ref 7–232)
Total CK: 675 U/L — ABNORMAL HIGH (ref 7–232)
Troponin I: 0.03 ng/mL (ref 0.00–0.06)

## 2011-01-09 LAB — POCT CARDIAC MARKERS
CKMB, poc: 3.4 ng/mL (ref 1.0–8.0)
Troponin i, poc: <0.05 ng/mL (ref 0.00–0.09)

## 2011-01-09 LAB — D-DIMER, QUANTITATIVE: D-Dimer, Quant: 0.26 ug/mL-FEU (ref 0.00–0.48)

## 2011-01-09 LAB — LIPID PANEL
Total CHOL/HDL Ratio: 2.4 RATIO
VLDL: 15 mg/dL (ref 0–40)

## 2011-01-09 LAB — TYPE AND SCREEN
ABO/RH(D): O POS
Antibody Screen: NEGATIVE

## 2011-01-09 LAB — MRSA PCR SCREENING: MRSA by PCR: NEGATIVE

## 2011-01-09 LAB — MAGNESIUM
Magnesium: 1.7 mg/dL (ref 1.5–2.5)
Magnesium: 1.8 mg/dL (ref 1.5–2.5)

## 2011-01-09 LAB — TROPONIN I
Troponin I: 0.04 ng/mL (ref 0.00–0.06)
Troponin I: 0.04 ng/mL (ref 0.00–0.06)

## 2011-01-09 LAB — BRAIN NATRIURETIC PEPTIDE: Pro B Natriuretic peptide (BNP): 354 pg/mL — ABNORMAL HIGH (ref 0.0–100.0)

## 2011-01-09 LAB — ABO/RH: ABO/RH(D): O POS

## 2011-01-10 LAB — URINALYSIS, ROUTINE W REFLEX MICROSCOPIC
Glucose, UA: NEGATIVE mg/dL
Hgb urine dipstick: NEGATIVE
Specific Gravity, Urine: 1.011 (ref 1.005–1.030)
Urobilinogen, UA: 0.2 mg/dL (ref 0.0–1.0)
pH: 5.5 (ref 5.0–8.0)

## 2011-01-10 LAB — RAPID URINE DRUG SCREEN, HOSP PERFORMED
Amphetamines: NOT DETECTED
Barbiturates: NOT DETECTED
Cocaine: NOT DETECTED
Opiates: NOT DETECTED

## 2011-01-10 LAB — GLUCOSE, CAPILLARY
Glucose-Capillary: 123 mg/dL — ABNORMAL HIGH (ref 70–99)
Glucose-Capillary: 123 mg/dL — ABNORMAL HIGH (ref 70–99)
Glucose-Capillary: 126 mg/dL — ABNORMAL HIGH (ref 70–99)
Glucose-Capillary: 159 mg/dL — ABNORMAL HIGH (ref 70–99)
Glucose-Capillary: 57 mg/dL — ABNORMAL LOW (ref 70–99)
Glucose-Capillary: 71 mg/dL (ref 70–99)

## 2011-01-10 LAB — CBC
HCT: 38.7 % — ABNORMAL LOW (ref 39.0–52.0)
MCHC: 35 g/dL (ref 30.0–36.0)
MCHC: 35.6 g/dL (ref 30.0–36.0)
MCV: 92.4 fL (ref 78.0–100.0)
MCV: 93.4 fL (ref 78.0–100.0)
MCV: 93.6 fL (ref 78.0–100.0)
Platelets: 136 10*3/uL — ABNORMAL LOW (ref 150–400)
Platelets: 142 10*3/uL — ABNORMAL LOW (ref 150–400)
RBC: 4.08 MIL/uL — ABNORMAL LOW (ref 4.22–5.81)
RDW: 13.1 % (ref 11.5–15.5)
WBC: 6.4 10*3/uL (ref 4.0–10.5)

## 2011-01-10 LAB — BASIC METABOLIC PANEL
BUN: 20 mg/dL (ref 6–23)
BUN: 28 mg/dL — ABNORMAL HIGH (ref 6–23)
CO2: 24 mEq/L (ref 19–32)
Calcium: 8.9 mg/dL (ref 8.4–10.5)
Calcium: 9 mg/dL (ref 8.4–10.5)
Chloride: 104 mEq/L (ref 96–112)
Chloride: 110 mEq/L (ref 96–112)
Chloride: 110 mEq/L (ref 96–112)
Creatinine, Ser: 1.13 mg/dL (ref 0.4–1.5)
Creatinine, Ser: 1.13 mg/dL (ref 0.4–1.5)
GFR calc Af Amer: >60 mL/min (ref >60)
Glucose, Bld: 96 mg/dL (ref 70–99)
Potassium: 4.4 mEq/L (ref 3.5–5.1)
Sodium: 139 mEq/L (ref 135–145)

## 2011-01-10 LAB — COMPREHENSIVE METABOLIC PANEL
Albumin: 3.4 g/dL — ABNORMAL LOW (ref 3.5–5.2)
BUN: 17 mg/dL (ref 6–23)
Calcium: 9 mg/dL (ref 8.4–10.5)
Creatinine, Ser: 1.27 mg/dL (ref 0.4–1.5)
Potassium: 3.9 mEq/L (ref 3.5–5.1)
Total Protein: 6.3 g/dL (ref 6.0–8.3)

## 2011-01-10 LAB — DIFFERENTIAL
Basophils Absolute: 0 10*3/uL (ref 0.0–0.1)
Basophils Relative: 0 % (ref 0–1)
Eosinophils Absolute: 0.1 10*3/uL (ref 0.0–0.7)
Neutro Abs: 3.6 10*3/uL (ref 1.7–7.7)
Neutrophils Relative %: 65 % (ref 43–77)

## 2011-01-10 LAB — PROTIME-INR
INR: 0.97 (ref 0.00–1.49)
Prothrombin Time: 12.8 seconds (ref 11.6–15.2)

## 2011-01-13 LAB — GLUCOSE, CAPILLARY
Glucose-Capillary: 100 mg/dL — ABNORMAL HIGH (ref 70–99)
Glucose-Capillary: 169 mg/dL — ABNORMAL HIGH (ref 70–99)
Glucose-Capillary: 219 mg/dL — ABNORMAL HIGH (ref 70–99)
Glucose-Capillary: 87 mg/dL (ref 70–99)

## 2011-01-13 LAB — BASIC METABOLIC PANEL
CO2: 26 mEq/L (ref 19–32)
Calcium: 8.8 mg/dL (ref 8.4–10.5)
Chloride: 109 mEq/L (ref 96–112)
Creatinine, Ser: 1.15 mg/dL (ref 0.4–1.5)
GFR calc Af Amer: >60 mL/min (ref >60)
Sodium: 139 mEq/L (ref 135–145)

## 2011-01-13 LAB — CBC
Hemoglobin: 14.1 g/dL (ref 13.0–17.0)
MCHC: 34.3 g/dL (ref 30.0–36.0)
RBC: 4.38 MIL/uL (ref 4.22–5.81)
WBC: 7.5 10*3/uL (ref 4.0–10.5)

## 2011-03-07 NOTE — Op Note (Signed)
NAMETYREE, FLUHARTY               ACCOUNT NO.:  [id number]   MEDICAL RECORD NO.:  [id number]          PATIENT TYPE:  AMB   LOCATION:  DSC                          FACILITY:  [facility]   PHYSICIAN:  Leonides Grills, M.D.     DATE OF BIRTH:  [birth year]   DATE OF PROCEDURE:  08/11/2008  DATE OF DISCHARGE:                               OPERATIVE REPORT   PREOPERATIVE DIAGNOSES:  1. Right anterior ankle impingement.  2. Right sinus tarsi impingement.  3. Sinus syndrome.   POSTOPERATIVE DIAGNOSES:  1. Right anterior ankle impingement.  2. Right sinus tarsi impingement.  3. Sinus syndrome.   OPERATION:  1. Right ankle arthroscopy with extensive debridement.  2. Right subtalar arthroscopy with debridement.   ANESTHESIA:  General.   SURGEON:  Leonides Grills, MD   ASSISTANT:  Richardean Canal, PA-C   ESTIMATED BLOOD LOSS:  Minimal.   TOURNIQUET:  None.   COMPLICATION:  None.   DISPOSITION:  Stable to PR.   INDICATIONS:  This is a 55 year old male who has had longstanding  anterolateral ankle pain as well as sinus tarsi pain and impingement  that was interfering with his life, he cannot if he wants to do.  He was  consented for the above procedure.  All risks with infection never  vessel injury, sinus formation, synovial cyst formation, persistent  pain, worsening pain, prolonged recovery, stiffness, and arthritis were  all explained.  Questions were encouraged and answered.   OPERATION:  The patient was brought to the operating room, placed in  supine position.  After adequate general anesthesia was administered as  well as Ancef 1 g IV piggyback.  A bump was placed in the right  ipsilateral hip, which was made out of silicone material.  All bony  prominence were well padded.  The right lower extremity was then prepped  and draped in a sterile manner.  No tourniquet was used.  Anatomical  landmarks include anterior tibialis tendon, peroneus tertius tendon,  superficial peroneal nerve,  lateral malleolus, and anterior process  calcaneus were mapped out.  Spinal needle was then placed into the  subtalar joint, 20 mL normal saline was instilled into subtalar joint.  Nick-and-spread technique was then utilized to create the anterior  portal of the subtalar arthroscopy.  Blunt tip trocar with cannula  followed by camera was then placed in the subtalar joint and under  direct visualization the medial portal was created just anterior to the  tip of the lateral malleolus.  Nick-and-spread technique was done to  perform this portal.  There was a large amount of synovitis in the  anterolateral aspect of the subtalar joint, extended into lateral  gutter.  Once this was debrided back, we found the loose body actually  within the joint, the anterolateral corner.  This was also carefully  dissected out, freed up, and then removed with a grabber.  Remaining  part of the subtalar joint appeared to be without any obvious pathology  and range of motion of the subtalar joint did not show any impingement.  Pictures were obtained throughout the procedure.  We  removed the camera.  We then placed a spinal needle just medial to the anterior tibialis and  into the ankle joint, 20 mL normal saline was instilled in the ankle  joint.  Nick-and-spread technique was then utilized to create the  anteromedial portal.  Blunt tip trocar with cannula followed by camera  was then placed into the ankle under direct visualization.  The  anterolateral portal was created lateral to peroneus tertius tendon and  the superficial peroneal nerve.  There was a tremendous amount of  synovitis anterolaterally and there was a prominence tib-fib ligament,  was actually causing a ridge to form in the anterolateral portion of the  talus, then with a shaver as well as radiofrequency bevel, the synovitis  as well as accessory tib-fib ligament was removed.  Ankle was ranged.  There was no impinging areas.  This was not a  full-thickness cartilage  lesion and we did not perform a debridement drilling of this cartilage  ridge.  Pictures were obtained throughout the procedure.  We then placed  camera anterolaterally, visualizing anteromedially.  Again, there was a  large amount of synovitis anteromedially and this was also debrided with  a shaver as well as radiofrequency bevel.  Ankle was ranged, and there  was no impingement especially in the anteromedial gutter as well.  Pictures obtained throughout the procedure.  Camera was removed.  Wounds  were closed with 4-0 nylon stitch.  Sterile dressing was applied.  Cam  Walker boot was applied.  The patient was stable to PR.      Leonides Grills, M.D.  Electronically Signed     PB/MEDQ  D:  08/11/2008  T:  08/12/2008  Job:  [id number]

## 2011-03-07 NOTE — Cardiovascular Report (Signed)
NAMEADVAY, Montgomery NO.:  [id number]   MEDICAL RECORD NO.:  [id number]          PATIENT TYPE:  OIB   LOCATION:  2807                         FACILITY:  [facility]   PHYSICIAN:  Nicki Guadalajara, M.D.     DATE OF BIRTH:  04-08-1956   DATE OF PROCEDURE:  05/08/2007  DATE OF DISCHARGE:                            CARDIAC CATHETERIZATION   PERCUTANEOUS CORONARY INTERVENTION NOTE:   INDICATIONS:  Jerry Montgomery is a 55 year old gentleman with known  coronary artery disease.  He is status post prior coronary intervention  with stenting of his LAD and circumflex May 2004.  He had recently  undergone a stress test, which showed progressive ischemia in the  inferolateral territory suggestive of circumflex disease.  Cardiac  catheterization on May 03, 2007, had shown a patent stent in the LAD  with a 40% proximal stenosis, diffuse 60% LAD stenosis before and after  a diagonal vessel.  He had progressive stenosis of his mid circumflex  with 80-90% narrowing eccentrically in a very dominant left circumflex  system.  There also were 80% and 60% stenoses and a very diminutive,  small, nondominant right coronary artery.  The patient's medications  were adjusted, he has been on Plavix.  He is now brought to [facility] for elective percutaneous coronary intervention of his mid  circumflex vessel.   PROCEDURE:  After premedication with Versed 2 mg intravenously, the  patient was prepped and draped in the usual fashion.  His right femoral  artery was punctured anteriorly and a 6-French sheath was inserted.  An  FL4 Voda was used for the coronary intervention.  Angiomas bolus and  infusion was administered.  ACT was documented to be therapeutic.  The  patient received an additional 150 mg of Plavix.  He also received  several doses of 200 mcg intracoronary nitroglycerin.  A marker wire was  advanced down the circumflex vessel.  Predilatation was done with a 3.0  x 12-mm  Maverick balloon.  A 3.5 x 13-mm drug-eluting Cypher stent was  then successfully deployed and dilated x2 up to 14 atmospheres.  Poststent dilatation was done utilizing a 3.75 x 12-mm Quantum balloon.  Scout angiography confirmed an excellent angiographic result.  There was  brisk TIMI-3 flow.  There was no evidence for dissection.   HEMODYNAMIC DATA:  Central aortic pressure was 148/84.   ANGIOGRAPHIC DATA:  Please refer to the patient's diagnostic cardiac  catheterization report for his diagnostic procedure.  At the start of  the percutaneous coronary intervention, the left circumflex was a  dominant vessel which arose from the left main.  It gave rise to an  immediate bifurcating vessel, which had a ramus intermediate-like  distribution.  I believe the patient has a stent in the proximal  circumflex, which was very hard to visualize, but this was widely  patent.  There was an 80-90% eccentric stenosis in the mid AV groove  circumflex after a sharp bend in the vessel.  There was diffuse 20-30%  distal circumflex narrowing.  Following successful PTCA/stenting with  ultimate insertion of a 3.5 x  13-mm Cypher stent postdilated to 3.75 mm,  the 85-90% mid circumflex stenosis was reduced to 0%.  There was brisk  TIMI-3 flow.  There was no evidence for dissection.   IMPRESSION:  Successful percutaneous coronary intervention of the mid  left circumflex coronary artery with 85% eccentric stenosis being  reduced to 0% with insertion of a 3.5 x 13-mm Cypher stent postdilated  to 3.75 mm, done with bivalirudin/Plavix/IC nitroglycerin.           ______________________________  Nicki Guadalajara, M.D.     TK/MEDQ  D:  05/08/2007  T:  05/09/2007  Job:  [id number]   cc:   Cardiac Catheterization Lab

## 2011-03-07 NOTE — Discharge Summary (Signed)
Jerry Montgomery, MCCURDY NO.:  [id number]   MEDICAL RECORD NO.:  [id number]          PATIENT TYPE:  OIB   LOCATION:  6525                         FACILITY:  [facility]   PHYSICIAN:  Nicki Guadalajara, M.D.     DATE OF BIRTH:  [birth year]   DATE OF ADMISSION:  05/08/2007  DATE OF DISCHARGE:  05/09/2007                               DISCHARGE SUMMARY   [name] is a 55 year old male patient of Dr. [name] who came into the  hospital for percutaneous intervention.  He had undergone a diagnostic  cardiac catheterization at [facility] and found to have  progressive disease.  Thus, he electively came in for a PCI.  This was  performed by Dr. [name] on May 08, 2007.  He had a lesion of 85% in his  left circumflex.  He underwent 3.5 x 13.0 CYPHER stenting.  The  following day, he was seen by Dr. [name].  He was without any complaints.  His groin was stable.  His blood pressure is 140-150/70.  His hemoglobin  was 15.8, hematocrit 45.8.  His platelets were 175.  His WBCs were 8.9.  His TSH was 7.214.  His T3 was 128, his FT4 was 111.  He did have a  chest x-ray that showed some interstitial lung disease.  He had an  opacity in his right mid lung zone, may represent a superimposition of  shadows, but was not seen in the prior study.  CT of chest would be  helpful in further evaluation as nodule or mass cannot be excluded.  He  is also a smoker, thus, he will be set up for an outpatient CT scan of  his lungs.   DISCHARGE MEDICATIONS:  Are:  1. Aspirin 325 mg a day.  2. Plavix 75 mg a day.  3. Protonix 40 mg a day.  4. Lipitor 40 mg a day.  5. Toprol-XL 200 mg a day.  6. Ramipril 10 mg a day.  7. Niaspan 500 mg at bedtime.  8. Glucovance 2.5/500 two tabs twice daily.  9. Imdur 30 mg a day.   Our office will call for an appointment with a CT scan.  He will also  need a monitor because he had some wide complex tachycardia; however, at  this time his potassium was low.  He  should do no strenuous activity.  No pushing or pulling for a week.  He states he is going to quit  smoking.   DISCHARGE DIAGNOSES:  1. Coronary artery disease with prior history and with progressive      disease, undergoing stenting of his circumflex with a CYPHER stent      this admission.  2. Hypertension.  3. Non-insulin-dependent diabetes mellitus.  4. Gastroesophageal reflux disease.  5. Wide complex tachycardia.  6. Elevated TSH, will need to be followed.      Lezlie Octave, N.P.    ______________________________  Nicki Guadalajara, M.D.    BB/MEDQ  D:  05/09/2007  T:  05/09/2007  Job:  [id number]

## 2011-03-07 NOTE — Assessment & Plan Note (Signed)
OFFICE VISIT   Jerry Montgomery, Jerry Montgomery  DOB:  1956/03/20                                        April 15, 2010  CHART #:  [id number]   The patient returned today and his blood pressure was 117/71, pulse 57,  respirations 18, and sats were 94%.  He is off oxygen.  His incisions  are well healed.  He is still having a moderate amount of chest pain,  but it is gradually improving.  We will release him to return to work in  3 weeks, but he will let us know if he has a lot of pain on returning to  work.  I told him he could do whatever he wanted to do.  I did give him  a refill for #50 Percocets.  I will see him back again in 6 weeks.   Ines Bloomer, M.D.  Electronically Signed   DPB/MEDQ  D:  04/15/2010  T:  04/16/2010  Job:  [id number]

## 2011-03-07 NOTE — Assessment & Plan Note (Signed)
OFFICE VISIT   SEYMOUR, PAVLAK  DOB:  May 07, 1956                                        Feb 22, 2010  CHART #:  [id number]   HISTORY:  The patient is a 55 year old male who was recently  hospitalized for a spontaneous right-sided pneumothorax.  The patient  has significant pulmonary disease including COPD, emphysema with chronic  interstitial changes, and right upper lobe bullae.  He additionally has  significant coronary artery disease, having previously undergone  multiple percutaneous interventions, with the most recent in March 2011.  He also has insulin-dependent diabetes, hyperlipidemia, hypertension,  and the pulmonary disease as described above.  He is seen in the office  today in routine follow up.  Currently, he feels well.  He denies  shortness of breath.  He is having no fevers, chills, or other  constitutional symptoms.  He is having only minor discomfort.   DIAGNOSTIC TESTS:  Chest x-ray was obtained on today's date.  It reveals  no recurrence of pneumothorax.  There are some chronic changes that  persist.   PHYSICAL EXAMINATION:  Vital Signs:  Blood pressure 107/69, pulse 66 and  regular, respirations 18 and unlabored, oxygen saturation is 92% on room  air.  General Appearance:  A well-developed white male in no acute  distress.  Pulmonary:  Fair air exchange throughout.  Cardiac:  Regular  rate and rhythm.  The chest tube incision site is healing well without  evidence of infection.  The chest tube suture was removed.  It appears  well approximated.   ASSESSMENT:  The patient is doing well with no early recurrence of his  pneumothorax.  He is interested in resuming working and Dr. Edwyna Shell feels  as though he should not fly until at least Mar 08, 2010.  He is  scheduled to see the pulmonologist tomorrow and he will need chronic  long-term medical management of his significant pulmonary disease.  We  will see him again in  approximately 3-4  weeks with a repeat chest x-ray at that time and of  course p.r.n. should he have any new surgical issues.   Rowe Clack, P.A.-C.   Sherryll Burger  D:  02/22/2010  T:  02/23/2010  Job:  [id number]   cc:   Casimiro Needle B. Sherene Sires, MD, [facility]  Nicki Guadalajara, M.D.

## 2011-03-07 NOTE — Assessment & Plan Note (Signed)
HEALTHCARE                             PULMONARY OFFICE NOTE   NAME:PHIPPSKwane, Rohl                      MRN:          [id number]  DATE:05/27/2007                            DOB:          21-Jun-1956    CHIEF COMPLAINT:  Evaluate abnormal CT scan.   HISTORY OF PRESENT ILLNESS:  A 55 year old male who was being evaluated  for cardiac stent placement.  A preoperative chest x-ray showed  abnormalities and a CT scan was obtained again demonstrating pulmonary  fibrotic changes and bronchiectasis.  He is referred for further  evaluation.  Patient has smoked for 30 years two and a half packs a day,  recently trying to cut back.  He is down to four cigarettes a day  currently.  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  He has had  some dust exposure in this regard but no sandblasting.  He is short of  breath with activity if he goes on an extreme run, but he is not short  of breath going up an incline or walking on level ground.  He is not  short of breath at rest.  He has no cough or mucous production at this  time.  He was coughing tan material, but he cut back on his smoking down  to four cigarettes a day, the cough was reduced.  He has history of  coronary artery disease with two stents placed in 2004 and another stent  placed in the same artery here recently.  He has history of irregular  heartbeats, denies any acid reflux or indigestion.  Denies any  difficulty swallowing, sore throat, tooth or dental problems.  He has  some slight headaches.  There has been no nasal congestion, sneezing,  itching or ear aches.   PAST MEDICAL HISTORY:  1. History of hypertension.  2. Heart dysrhythmias.  3. History of diabetes.  4. History of angioplasties as noted.  5. He had pneumonia twice, once in elementary school, another time      eight years ago.  6. History of type 2  diabetes.   PAST SURGICAL HISTORY:  No other operative history.   ALLERGIES:  NONE.   CURRENT MEDICATIONS:  1. Ramipril 10 mg daily.  2. Metoprolol 200 mg daily.  3. Glyburide 2.5/500 two b.i.d.  4. Lipitor 40 mg daily.  5. Protonix 40 mg daily.  6. Plavix 75 mg daily.  7. Isosorbide 60 mg daily.  8. Niaspan 500 mg daily.   SOCIAL HISTORY:  As noted above.   FAMILY HISTORY:  Mother had breast cancer, father had lung cancer,  otherwise noncontributory.   REVIEW OF SYSTEMS:  Otherwise noncontributory.   PHYSICAL EXAMINATION:  VITAL SIGNS:  Temperature 97.9, blood pressure  100/68, pulse 63, saturation 94% on room air.  GENERAL APPEARANCE:  This is a middle-aged male in no acute distress.  HEENT:  No jugular venous distension, no lymphadenopathy.  Oropharynx  clear.  NECK:  Supple.  CHEST:  Distant breath sounds, no elements of  wheezing, rhonchi or  rales.  CARDIOVASCULAR:  Regular rate and rhythm without S3, normal S1 and S2.  ABDOMEN:  Soft, nontender.  EXTREMITIES:  No edema, clubbing or venous disease.  SKIN:  Clear.  NEUROLOGIC:  Intact.   LABORATORY DATA:  CT scan of the chest was obtained and reviewed and  showed bilateral peripheral interstitial fibrotic changes, scattered  ground glass opacities, partial calcified mediastinal lymphadenopathy,  bi-apical emphysematous change.  These findings are more compatible with  environmental exposure.   IMPRESSION:  Environmental exposure with pulmonary fibrotic changes  likely due to inhalational lung disease with associated smoking induced  damage with bronchiectasis.   RECOMMENDATIONS:  Obtain full set of pulmonary function studies, wear a  mask for further and all dust exposure.  Discontinue further cigarettes.  Obtain Chantix to help in this regard.  Obtain a RAST assay, angiotensin  converting enzyme assay, Alpha I antitrypsin, ANA, rheumatoid factor,  sed rate and IgE levels and will return this patient for  follow-up in  two months.     Charlcie Cradle Delford Field, MD, [facility]  Electronically Signed    PEW/MedQ  DD: 05/27/2007  DT: 05/27/2007  Job #: [id number]

## 2011-03-07 NOTE — Assessment & Plan Note (Signed)
OFFICE VISIT   Jerry Montgomery, Jerry Montgomery  DOB:  Oct 23, 1956                                        June 01, 2010  CHART #:  [id number]   HISTORY OF PRESENT ILLNESS:  The patient is status post right VATS for  resection of apical bullae, pleurectomy, and pleurodesis done by Dr.  Edwyna Shell, Mar 15, 2010.  He was seen in the office, April 15, 2010, was  progressing well at that time.  He presents back today for a 6-week  followup visit.  The patient feels he is progressing well.  He does  still have some shortness of breath with ambulating inclines.  Otherwise, he is stable and back at work.  His pain has resolved.  He  denies any hemoptysis or wheezing.   PHYSICAL EXAMINATION:  Vital Signs:  Blood pressure 121/74, pulse 78,  respirations 16, and O2 sats 93% on room air.  Respiratory:  Clear to  auscultation bilaterally.  Cardiac:  Regular rate and rhythm.  Incisions:  All incisions are healing well.   STUDIES:  The patient had a PA and lateral chest x-ray obtained today  showing stable chronic interstitial lung disease with no active process.  There is no pneumothorax noted.  Overall, stable chest x-ray.   IMPRESSION AND PLAN:  The patient was seen and evaluated by Dr. Edwyna Shell.  The patient is progressing well following surgery.  He is to continue  ambulating 3-4 times per day and continue using his incentive  spirometer.  At this point, the patient is noted to be stable  postoperatively and can be released from the office and plan to continue  following up with Dr. Sherene Sires.  The patient is instructed if he has any  surgical issues, he is to contact us.  The patient is in agreement.   Ines Bloomer, M.D.  Electronically Signed   KMD/MEDQ  D:  06/01/2010  T:  06/02/2010  Job:  [id number]   cc:   Charlaine Dalton. Sherene Sires, MD, FCCP

## 2011-03-07 NOTE — Assessment & Plan Note (Signed)
[facility]                             PULMONARY OFFICE NOTE   NAME:Jerry Montgomery, Jerry Montgomery                      MRN:          [id number]  DATE:06/14/2007                            DOB:          September 07, 1956    Mr. Fant returns in follow up. This is a 55 year old with minimal  pulmonary fibrosis, bronchiectatic changes. He has now successfully quit  smoking on Chantix. He is not having active dyspnea, cough, or chest  discomfort. He is on no inhaled medications. His maintenance medicines  are the same as the last visit and are corrected as reviewed.   PHYSICAL EXAMINATION:  VITAL SIGNS:  Temperature 97, blood pressure  130/90, pulse 59, saturation 94% on room air.  CHEST:  Diminished breath sounds with prolonged expiratory phase. No  wheeze or rhonchi noted.  CARDIAC:  Regular rate and rhythm without S3, normal S1, S2.  ABDOMEN: Soft and nontender.  EXTREMITIES:  No edema or clubbing.   Pulmonary function test showed peripheral airflow obstruction with an  [id number], 47% predicted, improves by 22% following bronchodilator  therapy. Total lung capacity is at 75% predicted. Diffusion capacity is  at 56% predicted.   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   PLAN:  Pursue smoking cessation only and no systemic steroids or biopsy  are indicated. The patient will return for close follow up in three  months.     Charlcie Cradle Delford Field, MD, [facility]  Electronically Signed    PEW/MedQ  DD: 06/14/2007  DT: 06/16/2007  Job #: [id number]   cc:   Nicki Guadalajara, M.D.

## 2011-03-07 NOTE — Letter (Signed)
March 25, 2010   Charlaine Dalton. Sherene Sires, MD, FCCP  [address]  [place], [place] [address]   Re:  Jerry, Montgomery               DOB:  1956/09/09   Dear Kathlene November,   I saw the patient back today after his right VATS and resection of  apical blebs.  He had a lot of scarring in his apex and did well  overall.  We sent him home on some oxygen and he is using that at night.  His sats were 93% today.  His blood pressure was 122/44, pulse 70,  respirations 20, sats were 93%.  I will plan to see him back again in 3  weeks with a chest x-ray.  I told him to use his oxygen at night and  with exertion, and he will be seeing you on April 06, 2010.  I appreciate  the opportunity of seeing the patient.   Sincerely,   Ines Bloomer, M.D.  Electronically Signed   DPB/MEDQ  D:  03/25/2010  T:  03/26/2010  Job:  [id number]

## 2011-03-10 NOTE — Discharge Summary (Signed)
NAME:  Jerry Montgomery, Jerry Montgomery                         ACCOUNT NO.:  [id number]   MEDICAL RECORD NO.:  [id number]                   PATIENT TYPE:  INP   LOCATION:  6527                                 FACILITY:  [facility]   PHYSICIAN:  Nicki Guadalajara, M.D.                  DATE OF BIRTH:   DATE OF ADMISSION:  03/04/2003  DATE OF DISCHARGE:  03/06/2003                                 DISCHARGE SUMMARY   DISCHARGE DIAGNOSES:  1. Unstable angina pectoris, resolved.  2. Coronary artery disease status post cath with intervention.  3. Hypertension.  4. Non-insulin-dependent diabetes mellitus.  5. Long tobacco use.   HISTORY OF PRESENT ILLNESS:  This is a 55 year old Caucasian gentleman who  developed sudden onset of midsternal chest pain radiating to the left  anterior chest with mild shortness of breath while he was walking around at  [employer] the morning of admission.  He became somewhat diaphoretic, had  nausea without vomiting and got into his car, went back to work, but the  pain started radiating to the left arm and he was forced to go and see his  primary physician.  EKG was done and it showed inferolateral changes and EMS  was called to transfer the patient to the [facility].  He was seen  in the emergency department where he was given a few baby aspirin upon  arrival to [facility] and at that time he was pain free.  But the  pain lasted approximately one hour prior to taking the patient to the  hospital.  Patient reported that he had a couple of previous episodes  reminding him of the same, last one was approximately a year ago that  occurred at rest with same symptoms and signs as above and also lasted  approximately an hour.   PAST MEDICAL HISTORY:  1. Significant  for non-insulin-dependent diabetes mellitus with which he     was diagnosed approximately four years ago.  2. Hyperlipidemia.  3. He is status post removal of the right mandible interbody cyst in 2001.   ALLERGIES:  No known drug allergies.   FAMILY HISTORY:  Significant  for lung cancer, breast cancer.   SOCIAL HISTORY:  He is single.  He has a daughter.  Smokes one pack of  cigarettes a day, occasionally uses alcohol.   HOSPITAL COURSE:  He was admitted to the telemetry unit, started on IV  nitroglycerin and heparin and scheduled for a cardiac cath next morning.   HOSPITAL PROCEDURES:  Cardiac cath performed by Dr. Alanda Amass on Mar 05, 2003.  It showed multi-vessel coronary artery disease with 80% RCA stenosis,  circumflex proximal lesion high grade and 60-70% lesion in the mid portion,  obtuse marginal has proximal stenotic lesion, LAD had a mid-portion lesion  in the mid to distal portion stenosis.  The patient underwent PTCA and  stenting with Cypher  stent of proximal circumflex lesion with reduction of  the stenosis from 85-0% and also LAD PTCA and Cypher stenting with a  reduction of the lesion from 95% to less than 10%.  He tolerated this  procedure well.   Was given bolus and __________ drip, given Plavix and heparin.   The next morning the patient was stable.  Vital signs revealed a blood  pressure of 100/58, pulse was bradycardic 48.  The patient was asymptomatic.   He was deemed to be stable for discharge home.  Groin did not have any signs  of hematoma or ecchymosis with intact distal pulses.   DISCHARGE MEDICATIONS:  1. Aspirin coated 81 mg daily.  2. Lopressor 50 mg b.i.d.  3. Altace 2.5 mg daily.  4. Protonix 40 mg daily.  5. Glucovance resume as previous with administer dose.  6. Plavix 75 mg daily.  7. Lipitor 80 mg daily.  8. Wellbutrin 150 mg one pill for seven days and then b.i.d.   DISCHARGE INSTRUCTIONS:  1. No driving.  2. No lifting greater than 5 pounds.  3. No strenuous activity for three days.  4. Stay on a low-fat, low-cholesterol diet.  5. Patient is allowed to shower.  6. Patient was instructed to call with problems to our office, phone  number     and both the address provided.  He will have a followup appointment with     Dr. [name] on June 3 at 12:15.   HOSPITAL LABORATORY:  BUN post cath was 10, creatinine 1.0, potassium 4.1,  sodium 141, glucose 81.  Hemoglobin 15.6, hematocrit 45.3, white blood cell  count 10.2 and platelets are 183.   The patient was referred to [facility] by his primary physician,  Dr. Karilyn Cota, who requested Dr. Clarene Duke cardiology consult.  Dr. Clarene Duke was  out of town at that time and the patient was seen by Dr. [name].  At the time  of his discharge it was thought that he would follow up with Dr. Clarene Duke but  patient requested [facility] physician to follow him for  his cardiology needs.      Raymon Mutton, P.A.                    Nicki Guadalajara, M.D.    MK/MEDQ  D:  03/06/2003  T:  03/07/2003  Job:  [id number]   cc:   Nicki Guadalajara, M.D.  [phone]  [place], [place] [address]  Fax: [phone]   Wilson Singer, M.D.  [address]  [place]  [place] [address]  Fax: [phone]

## 2011-03-10 NOTE — Op Note (Signed)
[place]  Patient:    Jerry Montgomery, Jerry Montgomery                      MRN: [id number] Proc. Date: 09/03/00 Adm. Date:  [id number] Attending:  Retia Passe                           Operative Report  PREOPERATIVE DIAGNOSES:  A 4.0 cm intrabony cyst right posterior mandible, right mandibular torus, impacted teeth #16, 17, 32, and decayed tooth #31.  POSTOPERATIVE DIAGNOSES:  A 4.0 cm intrabony cyst right posterior mandible, right mandibular torus, impacted teeth #16, 17, 32, and decayed tooth #31.  SURGERY PERFORMED:  Removal of 4.0 cm intrabony cyst right mandible, removal of the above teeth, reduction of the right mandibular lingual torus.  SURGEON:  Vania Rea. Warren Danes, D.D.S.  FIRST ASSISTANT:  Gaynell Face.  ANESTHESIA:  General via nasoendotracheal intubation.  ESTIMATED BLOOD LOSS:  Less than 50 cc.  FLUID REPLACEMENT:  Approximately 1000 cc Crystalloid solutions.  COMPLICATIONS:  None apparent.  INDICATIONS:  Jerry Montgomery is a 55 year old gentleman, who was referred to my office by his family dentist for evaluation and removal of the large intrabony cyst of the right mandible.  The cyst was associated with deeply embedded third molar tooth #32 and decayed tooth #31.  The cyst was multilocular appearing in nature and extended approximately 1 cm below the right inferior alveolar neurovascular bundle.  Due to the position of the cyst, it was felt that the ______ could most safely be removed with preservation of the neurovascular bundle under general anesthesia in an operating room setting.  DETAILS OF PROCEDURE:  On September 03, 2000, Jerry Montgomery was taken to [facility], where he was placed on the operating room table in a supine position.  Following successful nasoendotracheal intubation and general anesthesia, the patients face, neck and oral cavity were prepped and draped in the usual sterile operating room fashion.   The hypopharynx was suctioned free of fluids and secretions and a moistened 2 inch vaginal pack was placed as a throat pack.  Attention was then directed intraorally, where approximately 12 cc of 0.5% Xylocaine containing 1:200,000 epinephrine were infiltrated in the right and left inferior alveolar neurovascular regions and the left posterior/superior alveolar neurovascular region.  Attention was then directed towards the left maxillary arch, where a 1.5 curvilinear incision was made overlying tooth #16.  A full-thickness mucoperiosteal flap was elevated laterally exposing cortical bone, which was reduced using rongeurs and cutting forceps.  The embedded tooth was then subluxated from the alveolus using a 301 elevator and a 191 dental elevator.  The tooth was removed from the alveolus using rongeur forceps.  The bony margins were then smoothed and contoured with a small osseous file and the dental follicular tissue was curetted using a double-ended Molt curet.  The area was then thoroughly irrigated with sterile saline irrigating solutions and suctioned.  The mucoperiosteal margins were then approximated and sutured in an interrupted fashion using 4-0 chromic suture material.  In a similar fashion, third molar tooth #17 was removed as well.  Attention was then directed towards the right mandibular arch, where a 4.5 linear incision was then made along the attached gingival tissues of the alveolar ridge around tooth #31, distally approximately 1.5 cm and anteriorly approximately 3.0 cm.  A full-thickness mucoperiosteal flap was elevated laterally and lingually using a #9 Molt  periosteal elevator.  The right mandibular torus was identified and reduced using a Stryker rotary osteotome and a #8 round bur.  The cortical bone was then reduced around tooth #31 and a large cystic cavity was entered, which contained dark brown colored fluid. Tooth #31 was then subluxated from the alveolus and  removed using a dental 23 forcep.   Embedded tooth #32 was then identified and suctioned in its long and vertical axes using a 107 fissure bur and the Stryker rotary osteotome.  The tooth was subluxated and removed from the alveolus using 11A elevator and mosquito hemostats.  The cystic cavity was then encountered and found to be filled with a cheesy, brownish colored fluid.  The cyst was gently curetted and teased from the lingual and lateral margins of the mandible and the inferior alveolar neurovascular bundle was encountered.  The cyst was gently dissected away from the neurovascular bundle and the neurovascular bundle remained intact.  The cyst was submitted for histologic examination.  Bony margins were then smoothed and contoured with a Stryker rotary osteotome and a #8 round bur.  The area was then thoroughly irrigated with sterile saline irrigating solutions and suctioned.  Mucoperiosteal margins were approximated and sutured in an interrupted fashion using 4-0 chromic suture material on an RB-1 needle.  The oral cavity was then thoroughly irrigated and suctioned. The throat pack was then removed and the hypopharynx suctioned free of fluids and secretions.  Jerry Montgomery was then allowed to awaken from anesthesia and taken to the recovery room, where he tolerated the procedure well without apparent complication. DD:  09/03/00 TD:  09/03/00 Job: [id number]

## 2011-03-10 NOTE — Op Note (Signed)
Jerry Montgomery. [facility]  Patient:    Jerry Montgomery, Jerry Montgomery                      MRN: [id number] Proc. Date: 11/02/00 Adm. Date:  [id number] Attending:  Sandi Raveling                           Operative Report  HISTORY:  I had the pleasure to see Jerry Montgomery today in the [facility] Emergency Room ______ .  Jerry Montgomery is a very pleasant 55 year old male, who sustained a laceration to the left small finger about the P1 region dorsally. He was noted to have an extensor tendon laceration by Tomi Bamberger, P.A. and I was actually consulted in regards to this.  The patient denies any significant injury besides this.  He was previously given a lidocaine block by Tomi Bamberger.  I reviewed his past medical history, etc.  ALLERGIES:  None.  MEDICINES:  Oral hypoglycemics.  PAST MEDICAL HISTORY:  Non-insulin-dependent diabetes.  PAST SURGICAL HISTORY:  Rotator cuff surgery by Dr. Despina Hick, as well as skin grafting to the left elbow as a child secondary to an injury.  SOCIAL HISTORY:  Patient is employed at [employer].  This was an on-the-job injury.  PHYSICAL EXAMINATION:  GENERAL:  A very pleasant white male, oriented, in no acute distress.  VITAL SIGNS:  Stable.  Patient has a transverse laceration over the proximal aspect of the P1 region dorsally left small finger.  He has slight extension lag and gives away somewhat with resisted extension.  The remaining fingers are normal.  There is no vascular abnormality noted.  There is no signs and symptoms of subacute compartment syndrome, etc.  I have discussed this with the patient at length. X-rays were reviewed today, which show no fracture, dislocation, or obvious ______  lesion in the soft tissues.  IMPRESSION:  Extensor tendon laceration left small finger with open wound.  PLAN:  I verbally consented him for I&D and repair of structures as necessary.  PROCEDURE NOTE:  The patient was taken to the procedure  area.  He was then given a Betadine scrub, followed by Betadine painting and draping by myself. This was a 10 minute scrub.  Following this, patient had copious irrigation applied and I&D accomplished.  This was a debridement of particulate matter, etc.  The laceration went down to the bone.  It did knick the periosteum. There was dirt particle removed.  Copious irrigation was applied to the wound. Once a clean field was noted, the patient then had retractors placed, mobilization of soft tissues accomplished and the patient then underwent cautery about bleeding points (dorsal veins).  Following this, patient underwent a repair of the extensor tendon with 4-0 Mersilene.  This was done in modified Kessler-Tajima and figure-of-eight fashion.  This was a six strand repair and the patient tolerated this well without difficulty.  There was no gapping and he had excellent range of motion and stability following this.  Wound was then copiously lavaged with saline.  Hemostasis was adequate and the wound was then closed with interrupted Prolene horizontal mattress in nature to slightly evert the skin.  He tolerated this well without difficulty.  All sponge, needle and instrument counts were reported as correct.  There were no immediate complications.  He was discharged home on Keflex, as well as Vicodin to be taken as directed and will return to see me  in 10 days in the office.  I have discussed with him to notify me should any fevers, chills or other problems develop.  If there are any red streaks, etc.  he will notify me.  All questions were encouraged and answered today.  His tetanus is up to date.  It has been a pleasure to participate in his medical care and I look forward to participating in his recovery. DD:  11/02/00 TD:  11/03/00 Job: [id number]

## 2011-03-10 NOTE — Cardiovascular Report (Signed)
NAME:  Jerry Montgomery, Jerry Montgomery                         ACCOUNT NO.:  [id number]   MEDICAL RECORD NO.:  [id number]                   PATIENT TYPE:  INP   LOCATION:  6527                                 FACILITY:  [facility]   PHYSICIAN:  Richard A. Alanda Amass, M.D.          DATE OF BIRTH:  07-Dec-1955   DATE OF PROCEDURE:  03/05/2003  DATE OF DISCHARGE:  03/06/2003                              CARDIAC CATHETERIZATION   PROCEDURE:  Retrograde central aortic catheterization, selective coronary  angiography, __________  intracoronary nitroglycerin, LV angiogram RAO/LAO  projection, subselective LIMA, __________  mid stream PA projection, weight-  adjusted heparin, Plavix 600 mg p.o., continued aspirin, Aggrastat bolus  plus infusion, percutaneous transluminal coronary angioplasty and subsequent  stent with drug-eluting stent, Cypher, proximal circumflex stenosis,  subsequent IVUS interrogation, percutaneous transluminal coronary  angioplasty and subsequent stent high-grade proximal left anterior  descending stenosis in the setting of unstable angina with small non-Q  myocardial infarction, minimal enzyme elevation, inferior T-wave changes,  new-onset angina with adult-onset diabetes mellitus, cigarette abuse.   INDICATIONS FOR PROCEDURE:  The patient is a 55 year old divorced father of  1 with 1 grandchild (1 child drowned at age 72).  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  Cholesterol  status is unknown.  Remote cyst removal from the right mandible in 2001,  uncomplicated, no tumor.  He was admitted by Dr. [name] on Mar 04, 2003, with  new-onset exertional dyspnea and chest discomfort with radiation to the left  arm compatible with ischemia and inferolateral T-wave changes, and minimal  enzyme elevation with troponin 0.13, CPK 207, MB 5.6.  He was started on  heparin, nitroglycerin,  beta-blockers, aspirin.  Fasting lipids pending.   DESCRIPTION OF PROCEDURE:  He was brought to the second floor CP lab in the  postabsorptive state after premedication with 5 mg of Valium p.o.  The right  groin was prepped, draped in the usual manner.  Then 1% Xylocaine was used  for local anesthesia and a CRFA was injected with a single anterior puncture  using an 18 thin-wall needle.  A short 6-French __________  side-arm sheath  was inserted.  Catheterization was begun in room #6.  The left coronary  angiography was done with a 6-French 4-cm taper, preformed Cordis left  coronary catheter.  After several initial injections, there was equipment  failure in this room.  The patient was given 2 mg of Versed for sedation.  The catheters were removed and the patient was transferred under sterile  conditions to room #2 for completion of the procedure.  The right coronary  angiography was done with a 6-French 4-cm tapered preformed Cordis coronary  catheter, and subselective LIMA and RIMA were done with the right coronary  catheter.  LV angiogram was done in the RAO and LAO  projections with 25 mL,  14 mL per second, 20 mL, 12 mL per second, respectively, with pullback  pressure showing no gradient across the aortic valve.  Abdominal aortic  angiogram was done in the mid stream PA projection with 25 mL, 20 mL per  second.  This revealed normal single renal arteries bilaterally.  No  significant infrarenal atherosclerotic disease, aneurysm, or stenosis, and  patent proximal, and common and external iliacs, with patent hypogastric  bilaterally and patent SFA profunda bilaterally.  The patient tolerated the  diagnostic procedure well.   PRESSURES:  LV:  110/zero; LVEDP:  18/20 mmHg.   CA:  110/70 mmHg.   No gradient across the aortic valve on catheter pullback.   LV ANGIOGRAM:  Demonstrated mild hypokinesis of the mid anterolateral wall  and mild hypokinesis of the distal inferior and  posterior apical segment.  EF of approximately 45% and no mitral regurgitation present.   FLUOROSCOPY:  Showed 2+ LAD, circumflex calcification.   CORONARIES:  The main left coronary artery was short but normal.   The LAD had segmental 50% irregularity and narrowing in the proximal third.  At the junction of the proximal mid third, there was tandem 95% and 70%  eccentric high-grade LAD lesions covering both 2 moderate-sized septal  perforator branches.   There was a moderately large diagonal with diffuse irregularities arising  from the mid LAD but no high-grade focal stenosis.  The LAD beyond the  diagonal in the mid portion had 50% to 60% irregularity and narrowing.  There was diffuse irregularity compatible with atherosclerotic disease of  the remainder of the LAD but no high-grade focal stenosis.   The circumflex artery gave off a first marginal branch with approximately  50% narrowing proximally but good flow of moderate size.   There was a small second marginal branch followed by an eccentric 85%  stenosis, moderately segmental.  There was another 60% to 70% concentric  stenosis in the mid circumflex.  This was a dominant vessel and beyond this  were PDA and PLA branches that had irregularity but no high-grade stenosis,  and were large.   The right coronary was nondominant.  It gave off a moderate-sized  bifurcating RV branch and then a second small branch with an 80% mid lesion.   IMPRESSION:  This 55 year old gentleman has history as outlined above.  He  has diffuse coronary disease angiographically, and mild LV dysfunction with  segmental wall motion abnormalities.  I believe his culprit lesion is  probably the circumflex, but he has high-grade LAD disease as well.   In view of his young age of 77, the fact that he has not been on aggressive  medical therapy, and is a cigarette smoker, I think he is a potential candidate for culprit lesion PCI and aggressive medical  therapy at this  time.  If this is not effective, he may eventually require a CABG.   After full discussion with the patient, he was agreeable to stent PCI in  this setting.   The patient was given weight-adjusted heparin of 5500 units.  ACTs were  monitored.  He was started on Aggrastat bolus plus infusion.  He was given  300 mg of Plavix p.o. x2.  The left coronary was intubated with a JL4 6-  [place] Guidant catheter and Asahi right 0.014-inch guide wire was used to  traverse the circumflex system.  The high-grade proximal circumflex lesion  was dilated at low pressures with a 2.5/15 ACS CrossSail balloon  at 4-42.  There was elastic recoil and persistent stenosis, so this was subsequently  stented with a DES 3.0/18-mm Cypher Cordis stent which was deployed at 12-  43, post dilated at 14-40.   Following this, the balloon was exchanged for an Atlantis SR Pro IVUS  catheter.  IVUS interrogation was then performed.  This demonstrated  excellent expansion and apposition of the circumflex stent.  There was  approximately 60% to 70% narrowing of the mid circumflex beyond this  compatible with the angiographic lesion, which had predominantly eccentric  soft plaque.  It was near the area of vessel dilatation.  I felt that we  should not stent this area since there was good flow, and there was soft  plaque, and there was a possibility of stabilization and/or regression with  vigorous medical therapy.  If the patient had further problems, this could  be approached as necessary.   The guide wire was then removed.  The stented area had a cross-sectional  area of 6 mm sq and was 3.0 x 2.8 mm.  The mid circumflex area that was no  stented had a cross-sectional area of 2.9-3.0 mm sq.   The guide wire was then advanced across the LAD lesion, was free in the  distal LAD.  The proximal tandem LAD lesions across the SP branches were  dilated with the same 2.5/15 CrossSail balloon at 6-44.  There was   suboptimal result and significant residual narrowing, but no dissection.  So, using exchange technique, this area was also stented.  This was done  with a 2.5/18 Cypher stent that was deployed at 16-55 and post dilated at 18-  46.   There was excellent angiographic expansion with stenosis reduction of 95%  plus 70% to zero percent or less than 10%, and good TIMI-3 flow.  There was  residual narrowing in the proximal and mid LAD but this was not approached.  The first septal perforator branch was subtotaled after stent deployment.  The second septal perforator branch was intact.  The patient had no chest  pain or EKG changes.  I did not want to IVUS this because there was good  angiographic result, and the patient did have significant irregularities of  the proximal LAD, and I did not want to disrupt any plaque.  The dilatation  system was removed.  Final ACT was 236 seconds.  The patient was brought to the holding area for observation, eventual sheath removal, and pressure  hemostasis.  We plan to continue him on Aggrastat (2b3a small molecule  inhibitor) for 18 hours.  Continue aspirin and Plavix.  Institute aggressive  therapy with beta-blockers, statin, and ACE inhibitor, close medical  followup, discontinuation of smoking, and exercise program.   Continue with medical therapy of his diabetes.   CATHETERIZATION DIAGNOSES:  1. Arteriosclerotic heart disease, unstable angina.  Small non-ST-elevation     myocardial infarction.  2. Successful culprit lesion percutaneous transluminal coronary angioplasty     and stent, mid circumflex stenosis with intravascular ultrasound     interrogation, Cypher drug-eluting stent, 3.0/18.  3. Successful percutaneous transluminal coronary angioplasty and subsequent     stent, tandem high-grade proximal left anterior descending lesions     (Cypher 2.5/18 drug-eluting stent).  Mild left ventricular dysfunction,     ejection fraction of 45% with segmental  wall motion abnormalities.  4. History of hypertension.  Normal renal arteries.  5. Adult-onset diabetes mellitus.  6.     Cigarette abuse, 2 packs a  day.  7. Probable hyperlipidemia.  8. Prior mandibular cyst removal.  No recurrence.                                               Richard A. Alanda Amass, M.D.    RAW/MEDQ  D:  03/05/2003  T:  03/06/2003  Job:  [id number]   cc:   CP Lab   Wilson Singer, M.D.  [address], [address]  [place]  [place] [address]  Fax: [phone]   Nicki Guadalajara, M.D.  [phone] [address], [address]  [place], [place] [address]  Fax: [phone]

## 2011-06-09 ENCOUNTER — Emergency Department (HOSPITAL_COMMUNITY)
Admission: EM | Admit: 2011-06-09 | Discharge: 2011-06-09 | Disposition: A | Discharge status: Home / Self Care | Payer: Worker's Compensation | Attending: Emergency Medicine | Admitting: Emergency Medicine

## 2011-06-09 ENCOUNTER — Emergency Department (HOSPITAL_COMMUNITY): Payer: Worker's Compensation

## 2011-06-09 DIAGNOSIS — Z79899 Other long term (current) drug therapy: Secondary | ICD-10-CM | POA: Insufficient documentation

## 2011-06-09 DIAGNOSIS — E119 Type 2 diabetes mellitus without complications: Secondary | ICD-10-CM | POA: Insufficient documentation

## 2011-06-09 DIAGNOSIS — I2510 Atherosclerotic heart disease of native coronary artery without angina pectoris: Secondary | ICD-10-CM | POA: Insufficient documentation

## 2011-06-09 DIAGNOSIS — M25569 Pain in unspecified knee: Secondary | ICD-10-CM | POA: Insufficient documentation

## 2011-06-09 DIAGNOSIS — M25469 Effusion, unspecified knee: Secondary | ICD-10-CM | POA: Insufficient documentation

## 2011-06-09 DIAGNOSIS — W11XXXA Fall on and from ladder, initial encounter: Secondary | ICD-10-CM | POA: Insufficient documentation

## 2011-06-09 DIAGNOSIS — IMO0002 Reserved for concepts with insufficient information to code with codable children: Secondary | ICD-10-CM | POA: Insufficient documentation

## 2011-06-16 ENCOUNTER — Ambulatory Visit (HOSPITAL_COMMUNITY): Payer: Worker's Compensation

## 2011-06-16 ENCOUNTER — Inpatient Hospital Stay (HOSPITAL_COMMUNITY)
Admission: RE | Admit: 2011-06-16 | Discharge: 2011-06-18 | DRG: 502 | Disposition: A | Discharge status: Home Health | Payer: Worker's Compensation | Source: Ambulatory Visit | Attending: Orthopedic Surgery | Admitting: Orthopedic Surgery

## 2011-06-16 DIAGNOSIS — E119 Type 2 diabetes mellitus without complications: Secondary | ICD-10-CM | POA: Diagnosis present

## 2011-06-16 DIAGNOSIS — Y9289 Other specified places as the place of occurrence of the external cause: Secondary | ICD-10-CM

## 2011-06-16 DIAGNOSIS — Z9861 Coronary angioplasty status: Secondary | ICD-10-CM

## 2011-06-16 DIAGNOSIS — I2510 Atherosclerotic heart disease of native coronary artery without angina pectoris: Secondary | ICD-10-CM | POA: Diagnosis present

## 2011-06-16 DIAGNOSIS — W11XXXA Fall on and from ladder, initial encounter: Secondary | ICD-10-CM | POA: Diagnosis present

## 2011-06-16 DIAGNOSIS — J438 Other emphysema: Secondary | ICD-10-CM | POA: Diagnosis present

## 2011-06-16 DIAGNOSIS — G4733 Obstructive sleep apnea (adult) (pediatric): Secondary | ICD-10-CM | POA: Diagnosis present

## 2011-06-16 DIAGNOSIS — G43909 Migraine, unspecified, not intractable, without status migrainosus: Secondary | ICD-10-CM | POA: Diagnosis present

## 2011-06-16 DIAGNOSIS — S838X9A Sprain of other specified parts of unspecified knee, initial encounter: Principal | ICD-10-CM | POA: Diagnosis present

## 2011-06-16 DIAGNOSIS — I10 Essential (primary) hypertension: Secondary | ICD-10-CM | POA: Diagnosis present

## 2011-06-16 LAB — URINALYSIS, ROUTINE W REFLEX MICROSCOPIC
Nitrite: NEGATIVE
Specific Gravity, Urine: 1.024 (ref 1.005–1.030)
pH: 5.5 (ref 5.0–8.0)

## 2011-06-16 LAB — CBC
Hemoglobin: 12.2 g/dL — ABNORMAL LOW (ref 13.0–17.0)
MCH: 31.6 pg (ref 26.0–34.0)
Platelets: 181 10*3/uL (ref 150–400)
RBC: 3.86 MIL/uL — ABNORMAL LOW (ref 4.22–5.81)
WBC: 8.3 10*3/uL (ref 4.0–10.5)

## 2011-06-16 LAB — DIFFERENTIAL
Basophils Absolute: 0 10*3/uL (ref 0.0–0.1)
Basophils Relative: 0 % (ref 0–1)
Eosinophils Absolute: 0.2 10*3/uL (ref 0.0–0.7)
Monocytes Relative: 14 % — ABNORMAL HIGH (ref 3–12)
Neutro Abs: 5.7 10*3/uL (ref 1.7–7.7)
Neutrophils Relative %: 68 % (ref 43–77)

## 2011-06-16 LAB — URINE MICROSCOPIC-ADD ON

## 2011-06-16 LAB — COMPREHENSIVE METABOLIC PANEL
AST: 21 U/L (ref 0–37)
Albumin: 3.2 g/dL — ABNORMAL LOW (ref 3.5–5.2)
CO2: 26 mEq/L (ref 19–32)
Calcium: 9.7 mg/dL (ref 8.4–10.5)
Creatinine, Ser: 1.48 mg/dL — ABNORMAL HIGH (ref 0.50–1.35)
GFR calc non Af Amer: 49 mL/min — ABNORMAL LOW (ref >60)
Potassium: 4.3 mEq/L (ref 3.5–5.1)
Total Protein: 7.3 g/dL (ref 6.0–8.3)

## 2011-06-16 LAB — GLUCOSE, CAPILLARY
Glucose-Capillary: 96 mg/dL (ref 70–99)
Glucose-Capillary: 101 mg/dL — ABNORMAL HIGH (ref 70–99)

## 2011-06-16 LAB — SURGICAL PCR SCREEN: Staphylococcus aureus: NEGATIVE

## 2011-06-17 LAB — GLUCOSE, CAPILLARY
Glucose-Capillary: 156 mg/dL — ABNORMAL HIGH (ref 70–99)
Glucose-Capillary: 200 mg/dL — ABNORMAL HIGH (ref 70–99)
Glucose-Capillary: 211 mg/dL — ABNORMAL HIGH (ref 70–99)

## 2011-06-17 LAB — PROTIME-INR
INR: 1.19 (ref 0.00–1.49)
Prothrombin Time: 15.4 seconds — ABNORMAL HIGH (ref 11.6–15.2)

## 2011-06-17 NOTE — Op Note (Signed)
  Jerry Montgomery, Jerry Montgomery               ACCOUNT NO.:  [id number]  MEDICAL RECORD NO.:  [id number]  LOCATION:  1537                         FACILITY:  [facility]  PHYSICIAN:  Georges Lynch. Phuong Moffatt, M.D.DATE OF BIRTH:  [birth year]  DATE OF PROCEDURE:  06/16/2011 DATE OF DISCHARGE:                              OPERATIVE REPORT   PREOPERATIVE DIAGNOSIS:  Complete rupture of the quadriceps tendon of the right knee secondary to a fall at work.  POSTOPERATIVE DIAGNOSIS:  Complete rupture of the quadriceps tendon of the right knee secondary to a fall at work.  OPERATION: 1. Open arthrotomy, right knee. 2. Open repair of the quadriceps tendon rupture, right knee. 3. Application of a knee immobilizer, right leg.  SURGEON:  Georges Lynch. Darrelyn Hillock, MD  ASSISTANT:  Nurse.  PROCEDURE IN DETAIL:  Under general anesthesia, routine orthopedic prepping and draping of the right lower extremity carried out.  He had 2 g of IV Ancef.  The appropriate time-out was carried out in the operating room prior to surgery, also I marked the appropriate leg in the holding area.  Following that, the leg was exsanguinated with an Esmarch and tourniquet was elevated to 325 mmHg.  The patient was in the supine position.  Incision was made over the anterior aspect of the right knee, bleeders identified, and cauterized.  Note, he had marked irregularity of the skin which is a chronic situation over the distal tibial tuberosity region.  Once I opened the knee, I did an open arthrotomy, quadriceps was completely ruptured except for small portions attached to it by the vastus lateralis.  At this time, I inspected the knee joint, thoroughly irrigated out the knee.  There were no major issues in the joint per se.  Following that, 2 drill holes were made in the patella from inferior to superior, suture passer was passed through, and a #5 Ethibond sutures used to suture the proximal quadriceps tendon to the patella.  Once this was done,  I then utilized [employer] and #1 Ethibond sutures to repair tendon to tendon that was still attached to the superior part of the patella.  We then kept the knee in extension and tied the suture down over the distal patellar pole region.  We had a good repair medial and laterally and made sure we repaired the defects medially and laterally.  Note, I created a nice soft tissue all below with the fascia going into the joint and I used that to close over the repair site and over the distal knot that we tied over the infrapatellar pole region.  The wound was thoroughly irrigated and closed.  Remaining part of the wound was closed in layers in usual fashion.  Skin with metal staples.  Sterile Neosporin dressing was applied.  Note, I placed him in a PAS hose and a knee immobilizer with his knee in extension. Postop, he will be maintained on the Coumadin protocol.          ______________________________ Georges Lynch. Darrelyn Hillock, M.D.     RAG/MEDQ  D:  06/16/2011  T:  06/16/2011  Job:  [id number]  Electronically Signed by Ranee Gosselin M.D. on 06/17/2011 08:33:20 AM

## 2011-06-17 NOTE — H&P (Signed)
NAMEDONNIS, PHANEUF               ACCOUNT NO.:  [id number]  MEDICAL RECORD NO.:  [id number]  LOCATION:  1537                         FACILITY:  [facility]  PHYSICIAN:  Georges Lynch. Seven Marengo, M.D.DATE OF BIRTH:  [birth year]  DATE OF ADMISSION:  06/16/2011 DATE OF DISCHARGE:                             HISTORY & PHYSICAL   Note, I have a written one in the chart that I did, but this is a dictated one.  Mr. Loman presented to the office with a history that he fell at work when going down a ladder and injured his right knee.  Basically, he came in with that history.  He has no other injuries noted.  The injury occurred as I mentioned at work.  Prior to me seeing him, he was evaluated in the emergency room at [facility], given a splint, referred to our office.  He had previous x-rays of his knee as well.  PAST HISTORY: 1. History of chronic obstructive lung disease. 2. Coronary artery disease. 3. Diabetes mellitus. 4. Emphysema. 5. High blood pressure. 6. Hypercholesterolemia. 7. Migraine headaches.  SOCIAL HISTORY:  Alcohol, occasional.  Tobacco, he did smoke, but he does not now.  MEDICATIONS: 1. Metformin. 2. Furosemide. 3. Isosorbide. 4. Losartan. 5. Lipitor. 6. Niaspan. 7. Plavix 75 mg a day. 8. Glyburide 5 mg a day. 9. Metoprolol 200 mg a day. 10.Aspirin.  Note, he will bring the appropriate doses and medicines to the hospital.  FAMILY HISTORY:  Mother and father had cancer.  There is a history of heart disease in the family and hypertension.  PAST SURGERIES:  Heart stents, lung surgery on the right, rotator cuff tendon repair on left.  ALLERGIES TO MEDICINES:  None.  PHYSICAL EXAMINATION:  MENTAL STATUS:  Alert and oriented. GAIT:  He came in a wheelchair with a knee immobilizer. VITAL SIGNS:  His blood pressure is 90/50.  His pulse was basically 82 and regular.  Respirations were normal. HEAD, EYES, EARS, NOSE, AND THROAT:  There are no lesions noted in  the mouth. NECK:  Negative. LUNGS:  Clear. HEART:  Normal sinus rhythm with no murmur. ABDOMEN:  There are no masses, tumors, or tenderness. BACK:  Negative. EXTREMITIES:  Upper extremities normal.  Hips normal.  Left knee and left lower extremity normal.  Right lower extremity had obvious pain, swelling of the right knee consistent with a quadriceps tendon injury. His calves were fine.  No phlebitis.  Circulation was intact. NEUROLOGIC:  Intact.  X-RAYS:  Plain x-rays were negative of his knee.  I did an MRI of his knee, had a complete rupture of the right quadriceps tendon.  IMPRESSION: 1. Complete rupture right quadriceps tendon. 2. Diabetes mellitus. 3. Hypertension. 4. Coronary artery disease. 5. Emphysema. 6. Migraine headaches.  PLAN:  I plan to admit him to the hospital, do an open repair of the right quadriceps tendon.  I did discuss all the possible complications of surgery with him and I also explained that we would put him on Coumadin, heparin protocol postop.          ______________________________ Georges Lynch. Darrelyn Hillock, M.D.     RAG/MEDQ  D:  06/16/2011  T:  06/17/2011  Job:  [id number]  Electronically Signed by Ranee Gosselin M.D. on 06/17/2011 08:33:24 AM

## 2011-06-18 LAB — PROTIME-INR: INR: 1.52 — ABNORMAL HIGH (ref 0.00–1.49)

## 2011-06-20 LAB — GLUCOSE, CAPILLARY: Glucose-Capillary: 249 mg/dL — ABNORMAL HIGH (ref 70–99)

## 2011-06-20 NOTE — Discharge Summary (Signed)
Jerry Montgomery, Jerry Montgomery               ACCOUNT NO.:  [id number]  MEDICAL RECORD NO.:  [id number]  LOCATION:  1537                         FACILITY:  [facility]  PHYSICIAN:  Georges Lynch. Mickeal Daws, M.D.DATE OF BIRTH:  1956/04/19  DATE OF ADMISSION:  06/16/2011 DATE OF DISCHARGE:                              DISCHARGE SUMMARY   He was taken to surgery by me on June 16, 2011.  He had a complete rupture of his right quadriceps tendon.  I did an open repair and open arthrotomy.  Postop day #1, he was doing well.  No specific problems. Preoperative pain which we were expecting, he was maintained on a Coumadin heparin protocol postop.  On August 25, he was seen by my associate and was doing fine.  He was up ambulating.  I saw him on June 18, 2011, to change his dressing, the wound looked good.  Vital signs are stable.  He is afebrile and he is ready to go home, he said. He has had no particular problems postop.  His INR on June 18, 2011, was 1.52; PT was 18.6.  LABS:  The pertinent laboratory studies in the hospital; his creatinine was slightly elevated at 1.48, his BUN was 30, and the upper limits were normal at 23.  He will follow up with his private medical doctor on that.  His sodium 136, potassium 4.3, his chloride 99, glucose 95.  BUN as I said was 30, creatinine 1.48, calcium 9.7, SGOT 21, SGPT 19, alkaline phosphatase 56, bilirubin is 1.2.  The PTT was 38.  The INR was 1.17.  The PT was 15.1.  His urinalysis showed trace leukocyte esterase, otherwise was normal.  White count was normal at 8,300, the hemoglobin 12.2, hematocrit 35.2, platelet count 181,000 with a normal differential, except for slightly elevated monocytes at 14, upper limits were normal with 12.  The remaining studies were fine.  DISCHARGE CONDITION:  Improved.  DISCHARGE DIET:  He will remain on his preop diabetic diet.  DISCHARGE MEDICATIONS:  Discharge medications were all on the discharge manager.  I also wrote  medication for muscle spasms, 500 mg of Robaxin t.i.d. for spasms.  He will be on Coumadin 5 mg a day.  He will also be on Percocet 10/650 one every 4 hours p.r.n. for pain.  DISCHARGE INSTRUCTIONS: 1. Will ambulate, partial weightbearing with his crutches. 2. See me in the office on Thursday or Friday, or prior to that if     there is any problems. 3. We will have a home health agency to manage his therapy and his     INRs at home. 4. He will not take his Plavix and aspirin, since he is on Coumadin. 5. He will monitor his blood sugars at home like he did preop.  DISCHARGE DIAGNOSES: 1. Complete rupture of his quadriceps tendon, right knee. 2. Emphysema. 3. Hypertension. 4. Migraine headaches. 5. Diabetes mellitus. 6. Coronary artery disease. 7. Chronic obstructive lung disease.          ______________________________ Georges Lynch. Darrelyn Hillock, M.D.     RAG/MEDQ  D:  06/18/2011  T:  06/18/2011  Job:  [id number]  Electronically Signed by Ranee Gosselin M.D. on  06/20/2011 08:47:17 AM

## 2011-07-24 LAB — POCT I-STAT, CHEM 8
Calcium, Ion: 1.16
HCT: 42
TCO2: 26

## 2011-07-24 LAB — GLUCOSE, CAPILLARY
Glucose-Capillary: 238 — ABNORMAL HIGH
Glucose-Capillary: 38 — CL

## 2011-08-07 LAB — CBC
HCT: 45.8
Hemoglobin: 15.8
RDW: 12.7

## 2011-08-07 LAB — TSH: TSH: 7.214 — ABNORMAL HIGH

## 2011-08-07 LAB — CARDIAC PANEL(CRET KIN+CKTOT+MB+TROPI)
CK, MB: 3
CK, MB: 3.6
Relative Index: INVALID
Total CK: 110
Total CK: 70
Troponin I: 0.04

## 2011-08-07 LAB — BASIC METABOLIC PANEL
BUN: 8
GFR calc non Af Amer: >60
Glucose, Bld: 125 — ABNORMAL HIGH
Potassium: 3.4 — ABNORMAL LOW

## 2011-08-07 LAB — T4, FREE: Free T4: 1.11

## 2011-08-07 LAB — T3: T3, Total: 128.8

## 2012-03-04 ENCOUNTER — Encounter: Payer: Self-pay | Admitting: Internal Medicine

## 2012-03-05 ENCOUNTER — Encounter: Payer: Self-pay | Admitting: Internal Medicine

## 2012-03-05 ENCOUNTER — Ambulatory Visit (INDEPENDENT_AMBULATORY_CARE_PROVIDER_SITE_OTHER)
Admission: RE | Admit: 2012-03-05 | Discharge: 2012-03-05 | Disposition: A | Discharge status: Home / Self Care | Payer: 59 | Source: Ambulatory Visit | Attending: Internal Medicine | Admitting: Internal Medicine

## 2012-03-05 ENCOUNTER — Ambulatory Visit (INDEPENDENT_AMBULATORY_CARE_PROVIDER_SITE_OTHER): Payer: 59 | Admitting: Internal Medicine

## 2012-03-05 VITALS — BP 120/92 | HR 64 | Temp 97.9°F | Ht 73.0 in | Wt 257.8 lb

## 2012-03-05 DIAGNOSIS — J449 Chronic obstructive pulmonary disease, unspecified: Secondary | ICD-10-CM

## 2012-03-05 DIAGNOSIS — J189 Pneumonia, unspecified organism: Secondary | ICD-10-CM

## 2012-03-05 DIAGNOSIS — J8410 Pulmonary fibrosis, unspecified: Secondary | ICD-10-CM

## 2012-03-05 MED ORDER — AMOXICILLIN-POT CLAVULANATE 875-125 MG PO TABS: 1.0000 | ORAL_TABLET | Freq: Two times a day (BID) | ORAL | Status: DC

## 2012-03-05 MED ORDER — TRAMADOL HCL 50 MG PO TABS: ORAL_TABLET | ORAL | Status: DC

## 2012-03-05 NOTE — Progress Notes (Signed)
Subjective:     Patient ID: Jerry Montgomery, male   DOB: 1956/09/27   MRN: [id number]  HPI  Primary = Jerry Montgomery   56 yowm quit smoking Nov 2009 with some am cough resolved and new R PTX 01/2010   April 07, 2010 Followup with PFT's. s/p admit [facility] with PNA and recurrent pneumothorax. He states that his breathing has been doing fine since then but not back to baseline assoc with coughing fits and frequent need for albuterol assoc with mild hoarseness and sensation of congested throat with noisy wheezing while sleeping per wife on chronic ACE per [name].  rec stop ramapril  start losartan 100mg  one daily in place of ramipril  use mucinex if needed for cough  use albuterol if needed for short of breath   June 08, 2010 main issue now is energy, not using any albuterol. cough resolved off ace1 rec stay off cig   July 2012 no limitations//no cough  Aug 2012 fell off ladder, tore R quad and very debilitated since   03/05/2012 Jerry Montgomery/ acute ov with acute  new sob x 2 weeks assoc with new cough > yellow / brown and gen ant chest discomfort worse with coughing.  No unusual exposures, no dental work or active sinus complains or epistaxis.  Some better on spiriva, using min otc's  Sleeping ok without nocturnal  or early am exacerbation  of respiratory  c/o's or need for noct saba. Also denies any obvious fluctuation of symptoms with weather or environmental changes or other aggravating or alleviating factors except as outlined above   ROS  At present neg for  any significant sore throat, dysphagia, dental problems, itching, sneezing,  nasal congestion or excess/ purulent secretions, ear ache,   fever, chills, sweats, unintended wt loss, pleuritic or exertional cp, hemoptysis, palpitations, orthopnea pnd or leg swelling.  Also denies presyncope, palpitations, heartburn, abdominal pain, anorexia, nausea, vomiting, diarrhea  or change in bowel or urinary habits, change in stools or urine, dysuria,hematuria,   rash, arthralgias, visual complaints, headache, numbness weakness or ataxia or problems with walking or coordination. No noted change in mood/affect or memory.                       Past Medical History:  DIABETES MELLITUS, TYPE II (ICD-250.00)  COPD (ICD-496)  - PFT's April 07, 2010 FEV1 2.56 (68%) ratio 72 no better B2, DLC0 45 corrects to 80%, flat insp loop  - desat p 3 laps June 08, 2010  HYPERTENSION (ICD-401.9)  - Try off ace April 07, 2010 due to pseudowheeze >  HYPERLIPIDEMIA (ICD-272.4)  PULMONARY FIBROSIS (ICD-515)  CAD (ICD-414.00)......................................Marland KitchenNicholaus Montgomery  - LHC 02/14/10 mild lv dysfunction > mutilvessel coronary artery disease with new stent cx  R PTX 02/11/10  - CT chest 423/11 Upper lobe predominant subpleural honeycombing with patch bilateral gg densities and rul bulla  - Mar 16 2010 Right video-assisted thoracoscopic surgery,  minithoracotomy, resection of apical bullae, pleurectomy, and  pleurodesis > 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   room sir after walking 3 laps.Jerry Montgomery placed on 2 liters of oxygen and his sats recovered to 93% and pulse was 55.  .   Review of Systems     Objective:   Physical Exam wt 259 Feb 23, 2010 > 248 April 07, 2010 > 252 June 08, 2010 > 03/05/2012  257  amb wm no longer pseudowheeze at all HEENT mild turbinate edema. Oropharynx no thrush or excess pnd  or cobblestoning. No JVD or cervical adenopathy. Mild accessory muscle hypertrophy. Trachea midline, nl thryroid. Chest was hyperinflated by percussion with diminished breath sounds and moderate increased exp time without wheeze. Hoover sign positive at mid inspiration. Regular rate and rhythm without murmur gallop or rub or increase P2 or edema. Abd: no hsm, nl excursion. Ext warm without cyanosis or clubbing  CXR  03/05/2012 : Patchy right upper lobe opacity, possibly reflecting pneumonia.  Additional patchy bilateral lower lobe  opacities, possibly chronic.      Assessment:          Plan:

## 2012-03-05 NOTE — Patient Instructions (Signed)
Augmentin 875 one twice daily with large glass of water and eat yogurt for lunch   For pain or cough take Tramadol 50 mg 1-2 every 4 hours  Please remember to go to the x-ray department downstairs for your tests - we will call you with the results when they are available.     Please schedule a follow up office visit in 6 weeks, call sooner if needed pfts

## 2012-03-06 ENCOUNTER — Telehealth: Payer: Self-pay | Admitting: Internal Medicine

## 2012-03-06 MED ORDER — AMOXICILLIN-POT CLAVULANATE 875-125 MG PO TABS: 1.0000 | ORAL_TABLET | Freq: Two times a day (BID) | ORAL | Status: AC

## 2012-03-06 MED ORDER — TRAMADOL HCL 50 MG PO TABS: ORAL_TABLET | ORAL | Status: AC

## 2012-03-06 NOTE — Progress Notes (Signed)
Quick Note:  Spoke with pt and notified of results per Dr. Wert. Pt verbalized understanding and denied any questions.  ______ 

## 2012-03-06 NOTE — Telephone Encounter (Signed)
Called [facility], cancelled the augmentin and tramadol.  Called spoke with pt's wife, apologized for the rx's being sent to the wrong pharmacy but informed her that they were cancelled and sent to the correct pharmacy.  Pt's wife was very understanding.  Nothing further needed.

## 2012-03-07 DIAGNOSIS — J189 Pneumonia, unspecified organism: Secondary | ICD-10-CM | POA: Insufficient documentation

## 2012-03-07 NOTE — Assessment & Plan Note (Signed)
Pt with underlying copd or mild ILD now with RUL as dz c/w pneumonia rx with augmentin x10 days then f/u with cxr by the 6th post hosp f/u ov

## 2012-03-07 NOTE — Assessment & Plan Note (Signed)
-   CT chest 02/12/10 1.Examination of the fourth chronic interstitial lung disease.  This consist mainly of upper lobe predominant subpleural  honeycombing  Patchy bilateral ground-glass densities and peribronchovascular  consolidation and nodularity. There are also calcified mediastinal  and hilar lymph nodes. 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 Differential considerations and occluded silicosis prior  granulomatous infection or hypersensitivity pneumonitis.  2. 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

## 2012-03-20 ENCOUNTER — Ambulatory Visit (INDEPENDENT_AMBULATORY_CARE_PROVIDER_SITE_OTHER)
Admission: RE | Admit: 2012-03-20 | Discharge: 2012-03-20 | Disposition: A | Discharge status: Home / Self Care | Payer: 59 | Source: Ambulatory Visit | Attending: Internal Medicine | Admitting: Internal Medicine

## 2012-03-20 ENCOUNTER — Ambulatory Visit (INDEPENDENT_AMBULATORY_CARE_PROVIDER_SITE_OTHER): Payer: 59 | Admitting: Internal Medicine

## 2012-03-20 ENCOUNTER — Other Ambulatory Visit (INDEPENDENT_AMBULATORY_CARE_PROVIDER_SITE_OTHER): Payer: 59

## 2012-03-20 ENCOUNTER — Encounter: Payer: Self-pay | Admitting: Internal Medicine

## 2012-03-20 VITALS — BP 108/74 | HR 74 | Temp 98.2°F | Ht 74.0 in | Wt 258.0 lb

## 2012-03-20 DIAGNOSIS — R05 Cough: Secondary | ICD-10-CM

## 2012-03-20 DIAGNOSIS — J189 Pneumonia, unspecified organism: Secondary | ICD-10-CM

## 2012-03-20 DIAGNOSIS — J8410 Pulmonary fibrosis, unspecified: Secondary | ICD-10-CM

## 2012-03-20 LAB — BASIC METABOLIC PANEL
CO2: 28 mEq/L (ref 19–32)
Glucose, Bld: 155 mg/dL — ABNORMAL HIGH (ref 70–99)
Potassium: 4.5 mEq/L (ref 3.5–5.1)
Sodium: 140 mEq/L (ref 135–145)

## 2012-03-20 LAB — CBC WITH DIFFERENTIAL/PLATELET
Eosinophils Relative: 3.2 % (ref 0.0–5.0)
Lymphocytes Relative: 25.2 % (ref 12.0–46.0)
Monocytes Relative: 11.8 % (ref 3.0–12.0)
Neutrophils Relative %: 59.5 % (ref 43.0–77.0)
Platelets: 164 10*3/uL (ref 150.0–400.0)
WBC: 6.1 10*3/uL (ref 4.5–10.5)

## 2012-03-20 LAB — SEDIMENTATION RATE: Sed Rate: 7 mm/hr (ref 0–22)

## 2012-03-20 MED ORDER — PREDNISONE (PAK) 10 MG PO TABS: ORAL_TABLET | ORAL | Status: AC

## 2012-03-20 NOTE — Patient Instructions (Signed)
For mucinex dm 1-2 every 12 hours and supplement with enough tramadol to knock out the cough and pain  Prednisone 10 mg take  4 each am x 2 days,   2 each am x 2 days,  1 each am x2days and stop   Try prilosec 20mg   Take 30-60 min before first meal of the day and Pepcid 20 mg one bedtime until cough is completely gone for at least a week without the need for cough suppression  I think of reflux for chronic cough like I do oxygen for fire (doesn't cause the fire but once you get the oxygen suppressed it usually goes away regardless of the exact cause).  GERD (REFLUX)  is an extremely common cause of respiratory symptoms, many times with no significant heartburn at all.    It can be treated with medication, but also with lifestyle changes including avoidance of late meals, excessive alcohol, smoking cessation, and avoid fatty foods, chocolate, peppermint, colas, red wine, and acidic juices such as orange juice.  NO MINT OR MENTHOL PRODUCTS SO NO COUGH DROPS  USE SUGARLESS CANDY INSTEAD (jolley ranchers or Stover's)  NO OIL BASED VITAMINS - use powdered substitutes.    Please remember to go to the lab  downstairs for your tests - we will call you with the results when they are available.

## 2012-03-20 NOTE — Progress Notes (Signed)
Subjective:     Patient ID: Jerry Montgomery, male   DOB: 12/29/1955   MRN: [id number]   Primary = Jerry Montgomery   Brief patient profile:  28 yowm quit smoking Nov 2009 with some am cough resolved p quit and new R PTX 01/2010   April 07, 2010 Followup with PFT's. s/p admit [facility] with PNA and recurrent pneumothorax. cc   breathing has been doing fine since then but not back to baseline assoc with coughing fits and frequent need for albuterol assoc with mild hoarseness and sensation of congested throat with noisy wheezing while sleeping per wife on chronic ACE per Jerry Montgomery.  rec stop ramapril  start losartan 100mg  one daily in place of ramipril  use mucinex if needed for cough  use albuterol if needed for short of breath   June 08, 2010 main issue now is energy, not using any albuterol. cough resolved off ace1 rec stay off cig   July 2012 no limitations//no cough  Aug 2012 fell off ladder, tore R quad and very debilitated since   03/05/2012 Jerry Montgomery/ acute ov with acute  new sob x 2 weeks assoc with new cough > yellow / brown and gen ant chest discomfort worse with coughing.  No unusual exposures, no dental work or active sinus complains or epistaxis.  Some better on spiriva, using min otc's, no fever, non soaking  rec Augmentin 875 one twice daily with large glass of water and eat yogurt for lunch  For pain or cough take Tramadol 50 mg 1-2 every 4 hours Please schedule a follow up office visit in 6 weeks>  pfts   03/20/2012 f/u ov/Jerry Montgomery cc no better, not using more than 2-4 tramadol per day, mucus less brown, slt yellow now, generalized chest discomfort when coughing only.  No overt sinus or reflux symptoms, no daytime saba or variability   Sleeping ok without nocturnal  or early am exacerbation  of respiratory  c/o's or need for noct saba. Also denies any obvious fluctuation of symptoms with weather or environmental changes or other aggravating or alleviating factors except as outlined above   ROS   At present neg for  any significant sore throat, dysphagia, dental problems, itching, sneezing,  nasal congestion or excess/ purulent secretions, ear ache,   fever, chills, sweats, unintended wt loss, pleuritic or exertional cp, hemoptysis, palpitations, orthopnea pnd or leg swelling.  Also denies presyncope, palpitations, heartburn, abdominal pain, anorexia, nausea, vomiting, diarrhea  or change in bowel or urinary habits, change in stools or urine, dysuria,hematuria,  rash, arthralgias, visual complaints, headache, numbness weakness or ataxia or problems with walking or coordination. No noted change in mood/affect or memory.      Past Medical History:  DIABETES MELLITUS, TYPE II (ICD-250.00)  COPD (ICD-496)  - PFT's April 07, 2010 FEV1 2.56 (68%) ratio 72 no better B2, DLC0 45 corrects to 80%, flat insp loop  - desat p 3 laps June 08, 2010  HYPERTENSION (ICD-401.9)  - Try off ace April 07, 2010 due to pseudowheeze >  HYPERLIPIDEMIA (ICD-272.4)  PULMONARY FIBROSIS (ICD-515)  CAD (ICD-414.00)......................................Marland KitchenNicholaus Montgomery  - LHC 02/14/10 mild lv dysfunction > mutilvessel coronary artery disease with new stent cx  R PTX 02/11/10  - CT chest 423/11 Upper lobe predominant subpleural honeycombing with patch bilateral gg densities and rul bulla  - Mar 16 2010 Right video-assisted thoracoscopic surgery,  minithoracotomy, resection of apical bullae, pleurectomy, and  pleurodesis > 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   room sir  after walking 3 laps.  placed on 2 liters of oxygen and his sats recovered to 93% and pulse was 55.          Objective:   Physical Exam wt 259 Feb 23, 2010 > 248 April 07, 2010 > 252 June 08, 2010 > 03/05/2012  257 > 258 03/20/2012  amb wm nad  HEENT mild turbinate edema. Oropharynx no thrush or excess pnd or cobblestoning. No JVD or cervical adenopathy. Mild accessory muscle hypertrophy. Trachea midline, nl thryroid. Chest was hyperinflated by  percussion with diminished breath sounds and moderate increased exp time without wheeze. Hoover sign positive at mid inspiration. Regular rate and rhythm without murmur gallop or rub or increase P2 or edema. Abd: no hsm, nl excursion. Ext warm without cyanosis or clubbing  CXR  03/20/2012 :   Right perihilar and bibasilar air space opacities, some of which appear chronic. Overall appearance is unchanged from 03/05/2012. CT chest without contrast may be helpful in further evaluation, as clinically indicated.   Lab 03/20/12 nl cbc, nl esr     Assessment:          Plan:

## 2012-03-21 NOTE — Progress Notes (Signed)
Quick Note:  Spoke with pt and notified of results per Dr. Wert. Pt verbalized understanding and denied any questions.,  ______ 

## 2012-03-22 ENCOUNTER — Telehealth: Payer: Self-pay | Admitting: *Deleted

## 2012-03-22 DIAGNOSIS — R05 Cough: Secondary | ICD-10-CM | POA: Insufficient documentation

## 2012-03-22 NOTE — Assessment & Plan Note (Signed)
Of the three most common causes of chronic cough, only one (GERD)  can actually cause the other two (asthma and post nasal drip syndrome)  and perpetuate the cylce of cough inducing airway trauma, inflammation, heightened sensitivity to reflux which is prompted by the cough itself via a cyclical mechanism.    This may partially respond to steroids and look like asthma and post nasal drainage but never erradicated completely unless the cough and the secondary reflux are eliminated, preferably both at the same time.  While not intuitively obvious, many patients with chronic low grade reflux do not cough until there is a secondary insult that disturbs the protective epithelial barrier and exposes sensitive nerve endings.  This can be viral or direct physical injury such as with an endotracheal tube.   The point is that once this occurs, it is difficult to eliminate using anything but a maximally effective acid suppression regimen at least in the short run, accompanied by an appropriate diet to address non acid GERD.  

## 2012-03-22 NOTE — Telephone Encounter (Signed)
Spoke with pt's spouse and she states that the pt is already scheduled for PFT and rov with MW for July which was 1st available. Nothing further needed.

## 2012-03-22 NOTE — Assessment & Plan Note (Addendum)
DDx for pulmonary fibrosis  includes idiopathic pulmonary fibrosis, pulmonary fibrosis associated with rheumatologic diseases (which have a relatively benign course in most cases) , adverse effect from  drugs such as chemotherapy or amiodarone exposure, nonspecific interstitial pneumonia which is typically steroid responsive, and chronic hypersensitivity pneumonitis. Sarcoidosis suggested by radiology very unlikely especially in view of wedge bx in 2011 neg for granulomas.   In active  smokers Langerhan's Cell  Histiocyctosis (eosinophilic granuomatosis),  DIP,  and Respiratory Bronchiolitis ILD also need to be considered,    Not clear what fits best here,  Will need pft's and HRCT next.

## 2012-03-22 NOTE — Telephone Encounter (Signed)
Message copied by Christen Butter on Fri Mar 22, 2012 11:14 AM ------      Message from: Nyoka Cowden      Created: Fri Mar 22, 2012  7:22 AM       Needs ov with pft's next available

## 2012-04-24 ENCOUNTER — Encounter: Payer: Self-pay | Admitting: Internal Medicine

## 2012-04-24 ENCOUNTER — Ambulatory Visit (INDEPENDENT_AMBULATORY_CARE_PROVIDER_SITE_OTHER)
Admission: RE | Admit: 2012-04-24 | Discharge: 2012-04-24 | Disposition: A | Discharge status: Home / Self Care | Payer: 59 | Source: Ambulatory Visit | Attending: Internal Medicine | Admitting: Internal Medicine

## 2012-04-24 ENCOUNTER — Ambulatory Visit (INDEPENDENT_AMBULATORY_CARE_PROVIDER_SITE_OTHER): Payer: 59 | Admitting: Internal Medicine

## 2012-04-24 VITALS — BP 90/60 | HR 51 | Temp 97.8°F | Ht 74.0 in | Wt 254.0 lb

## 2012-04-24 DIAGNOSIS — J449 Chronic obstructive pulmonary disease, unspecified: Secondary | ICD-10-CM

## 2012-04-24 DIAGNOSIS — J8410 Pulmonary fibrosis, unspecified: Secondary | ICD-10-CM

## 2012-04-24 DIAGNOSIS — J4489 Other specified chronic obstructive pulmonary disease: Secondary | ICD-10-CM

## 2012-04-24 DIAGNOSIS — R059 Cough, unspecified: Secondary | ICD-10-CM

## 2012-04-24 DIAGNOSIS — R05 Cough: Secondary | ICD-10-CM

## 2012-04-24 DIAGNOSIS — J189 Pneumonia, unspecified organism: Secondary | ICD-10-CM

## 2012-04-24 LAB — PULMONARY FUNCTION TEST

## 2012-04-24 NOTE — Assessment & Plan Note (Signed)
Clinically and radiographically resolved with minimal residual scarring in bases and minimal pleuritis likely from pleural scarring as well s active effusion or other inflammatory process based on nl esr last ov

## 2012-04-24 NOTE — Progress Notes (Signed)
PFT done today. 

## 2012-04-24 NOTE — Progress Notes (Signed)
Quick Note:  Spoke with pt and notified of results per Dr. Wert. Pt verbalized understanding and denied any questions.  ______ 

## 2012-04-24 NOTE — Assessment & Plan Note (Signed)
No evidence of any progressive fibrosing ILD at this point

## 2012-04-24 NOTE — Progress Notes (Signed)
Subjective:     Patient ID: Jerry Montgomery, male   DOB: 13-Feb-1956   MRN: [id number]   Primary = [name]   Brief patient profile:  77 yowm quit smoking Nov 2009 with some am cough resolved p quit and new R PTX 01/2010   April 07, 2010 Followup with PFT's. s/p admit [facility] with PNA and recurrent pneumothorax. cc   breathing has been doing fine since then but not back to baseline assoc with coughing fits and frequent need for albuterol assoc with mild hoarseness and sensation of congested throat with noisy wheezing while sleeping per wife on chronic ACE per [name].  rec stop ramapril  start losartan 100mg  one daily in place of ramipril  use mucinex if needed for cough  use albuterol if needed for short of breath   June 08, 2010 main issue now is energy, not using any albuterol. cough resolved off ace1 rec stay off cig   July 2012 no limitations//no cough  Aug 2012 fell off ladder, tore R quad and very debilitated since   03/05/2012 Jerry Montgomery/ acute ov with acute  new sob x 2 weeks assoc with new cough > yellow / brown and gen ant chest discomfort worse with coughing.  No unusual exposures, no dental work or active sinus complains or epistaxis.  Some better on spiriva, using min otc's, no fever, non soaking  rec Augmentin 875 one twice daily with large glass of water and eat yogurt for lunch  For pain or cough take Tramadol 50 mg 1-2 every 4 hours Please schedule a follow up office visit in 6 weeks>  pfts   03/20/2012 f/u ov/Jerry Montgomery cc no better, not using more than 2-4 tramadol per day, mucus less brown, slt yellow now, generalized chest discomfort when coughing only.  No overt sinus or reflux symptoms, no daytime saba or variability rec For mucinex dm 1-2 every 12 hours and supplement with enough tramadol to knock out the cough and pain Prednisone 10 mg take  4 each am x 2 days,   2 each am x 2 days,  1 each am x2days and stop  Try prilosec 20mg   Take 30-60 min before first meal of the day and  Pepcid 20 mg one bedtime until cough is completely gone for at least a week without the need for cough suppression GERD diet.    04/24/2012 f/u ov/Jerry Montgomery cc pain much better, not limited by breathing.  No unusual cough, purulent sputum or sinus/hb symptoms on present rx. No longer needing more than 2 tramadol in 24 h for cp.   Sleeping ok without nocturnal  or early am exacerbation  of respiratory  c/o's or need for noct saba. Also denies any obvious fluctuation of symptoms with weather or environmental changes or other aggravating or alleviating factors except as outlined above   ROS  At present neg for  any significant sore throat, dysphagia, dental problems, itching, sneezing,  nasal congestion or excess/ purulent secretions, ear ache,   fever, chills, sweats, unintended wt loss, pleuritic or exertional cp, hemoptysis, palpitations, orthopnea pnd or leg swelling.  Also denies presyncope, palpitations, heartburn, abdominal pain, anorexia, nausea, vomiting, diarrhea  or change in bowel or urinary habits, change in stools or urine, dysuria,hematuria,  rash, arthralgias, visual complaints, headache, numbness weakness or ataxia or problems with walking or coordination. No noted change in mood/affect or memory.      Past Medical History:  DIABETES MELLITUS, TYPE II (ICD-250.00)  COPD (ICD-496)  - PFT's  April 07, 2010 FEV1 2.56 (68%) ratio 72 no better B2, DLC0 45 corrects to 80%, flat insp loop  - desat p 3 laps June 08, 2010  HYPERTENSION (ICD-401.9)  - Try off ace April 07, 2010 due to pseudowheeze >  HYPERLIPIDEMIA (ICD-272.4)  PULMONARY FIBROSIS (ICD-515)  CAD (ICD-414.00)......................................Marland KitchenNicholaus Bloom  - LHC 02/14/10 mild lv dysfunction > mutilvessel coronary artery disease with new stent cx  R PTX 02/11/10  - CT chest 423/11 Upper lobe predominant subpleural honeycombing with patch bilateral gg densities and rul bulla  - Mar 16 2010 Right video-assisted thoracoscopic surgery,    minithoracotomy, resection of apical bullae, pleurectomy, and  pleurodesis > 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   room sir after walking 3 laps.  placed on 2 liters of oxygen and his sats recovered to 93% and pulse was 55.          Objective:   Physical Exam wt 259 Feb 23, 2010 > 248 April 07, 2010 >   258 03/20/2012 > 04/24/2012 254 amb wm nad  HEENT mild turbinate edema. Oropharynx no thrush or excess pnd or cobblestoning. No JVD or cervical adenopathy. Mild accessory muscle hypertrophy. Trachea midline, nl thryroid. Chest was hyperinflated by percussion with diminished breath sounds and moderate increased exp time without wheeze. Hoover sign positive at mid inspiration. Regular rate and rhythm without murmur gallop or rub or increase P2 or edema. Abd: no hsm, nl excursion. Ext warm without cyanosis or clubbing     Lab 03/20/12 nl cbc, nl esr  CXR  04/24/2012 :  No evidence of acute cardiopulmonary disease.  Chronic perihilar/lower lobe opacities.       Assessment:          Plan:

## 2012-04-24 NOTE — Assessment & Plan Note (Signed)
-   PFT's 04/24/2012  2.79 (76%)  Ratio 68  And DLC0 54% and corrects 80%  GOLD II (barely) but no limiting symptoms and no tendency to aecopd so no rx needed at this point  I reviewed the Flethcher curve with patient that basically indicates  if you quit smoking when your best day FEV1 is still well preserved (whihc his certainly is)  it is highly unlikely you will progress to severe disease and informed the patient there was no medication on the market that has proven to change the curve or the likelihood of progression.  Therefore stopping smoking and maintaining abstinence is the most important aspect of care, not choice of inhalers or for that matter, doctors.

## 2012-04-24 NOTE — Patient Instructions (Addendum)
Please remember to go to the  x-ray department downstairs for your tests - we will call you with the results when they are available.   If you are satisfied with your treatment plan let your doctor know     If in any way you are not 100% satisfied,  please tell us.  If 100% better, tell your friends!

## 2012-05-08 ENCOUNTER — Encounter: Payer: Self-pay | Admitting: Internal Medicine

## 2012-11-21 ENCOUNTER — Other Ambulatory Visit (HOSPITAL_COMMUNITY): Payer: Self-pay | Admitting: Cardiovascular Disease

## 2012-11-21 DIAGNOSIS — I2510 Atherosclerotic heart disease of native coronary artery without angina pectoris: Secondary | ICD-10-CM

## 2012-11-29 ENCOUNTER — Ambulatory Visit (HOSPITAL_COMMUNITY)
Admission: RE | Admit: 2012-11-29 | Discharge: 2012-11-29 | Disposition: A | Discharge status: Home / Self Care | Payer: 59 | Source: Ambulatory Visit | Attending: Cardiovascular Disease | Admitting: Cardiovascular Disease

## 2012-11-29 ENCOUNTER — Other Ambulatory Visit (HOSPITAL_COMMUNITY): Payer: Self-pay | Admitting: Cardiovascular Disease

## 2012-11-29 DIAGNOSIS — I10 Essential (primary) hypertension: Secondary | ICD-10-CM | POA: Insufficient documentation

## 2012-11-29 DIAGNOSIS — E119 Type 2 diabetes mellitus without complications: Secondary | ICD-10-CM | POA: Insufficient documentation

## 2012-11-29 DIAGNOSIS — R0989 Other specified symptoms and signs involving the circulatory and respiratory systems: Secondary | ICD-10-CM | POA: Insufficient documentation

## 2012-11-29 DIAGNOSIS — I2510 Atherosclerotic heart disease of native coronary artery without angina pectoris: Secondary | ICD-10-CM

## 2012-11-29 DIAGNOSIS — R0609 Other forms of dyspnea: Secondary | ICD-10-CM | POA: Insufficient documentation

## 2012-11-29 MED ORDER — TECHNETIUM TC 99M SESTAMIBI GENERIC - CARDIOLITE
30.3000 | Freq: Once | INTRAVENOUS | Status: AC | PRN
  Administered 2012-11-29: 30.3 via INTRAVENOUS

## 2012-11-29 MED ORDER — TECHNETIUM TC 99M SESTAMIBI GENERIC - CARDIOLITE
11.0000 | Freq: Once | INTRAVENOUS | Status: AC | PRN
  Administered 2012-11-29: 11 via INTRAVENOUS

## 2012-11-29 MED ORDER — REGADENOSON 0.4 MG/5ML IV SOLN
0.4000 mg | Freq: Once | INTRAVENOUS | Status: AC
  Administered 2012-11-29: 0.4 mg via INTRAVENOUS

## 2012-11-29 NOTE — Procedures (Addendum)
[facility] [address] [place] [address] [place] [address] [phone]  Cardiology Nuclear Med Study  AISON MALVEAUX is a 56 y.o. male     MRN : [id number]     DOB: 11/22/1955  Procedure Date: 11/29/2012  Nuclear Med Background Indication for Stress Test:  Stent Patency History:  Stent Placement 3/11 Cardiac Risk Factors: History of Smoking, Hypertension, IDDM Type 2, Lipids and Overweight  Symptoms:  DOE   Nuclear Pre-Procedure Caffeine/Decaff Intake:  7:00pm NPO After: 5:00am   IV Site: R Antecubital  IV 0.9% NS with Angio Cath:  22g  Chest Size (in):  46 IV Started by: Koren Shiver, CNMT  Height: 6\' 2"  (1.88 m)  Cup Size: n/a  BMI:  Body mass index is 33.38 kg/(m^2). Weight:  260 lb (117.935 kg)   Tech Comments:  n/a    Nuclear Med Study 1 or 2 day study: 1 day  Stress Test Type:  Lexiscan  Order Authorizing Provider:  Nicki Guadalajara, MD   Resting Radionuclide: Technetium 39m Sestamibi  Resting Radionuclide Dose: 11.0 mCi   Stress Radionuclide:  Technetium 29m Sestamibi  Stress Radionuclide Dose: 30.3 mCi           Stress Protocol Rest HR: 74 Stress HR:96  Rest BP: 131/93 Stress BP: 156/80  Exercise Time (min): n/a METS: n/a          Dose of Adenosine (mg):  n/a Dose of Lexiscan: 0.4 mg  Dose of Atropine (mg): n/a Dose of Dobutamine: n/a mcg/kg/min (at max HR)  Stress Test Technologist: Ernestene Mention, CCT Nuclear Technologist: Gonzella Lex, CNMT   Rest Procedure:  Myocardial perfusion imaging was performed at rest 45 minutes following the intravenous administration of Technetium 72m Sestamibi. Stress Procedure:  The patient received IV Lexiscan 0.4 mg over 15-seconds.  Technetium 61m Sestamibi injected at 30-seconds.  There were no significant changes with Lexiscan.  Quantitative spect images were obtained after a 45 minute delay.  Transient Ischemic Dilatation (Normal <1.22):  0.98 Lung/Heart Ratio (Normal <0.45):   0.33 QGS EDV:  n/a ml QGS ESV:  n/a ml LV Ejection Fraction: Study not gated  Signed by      Rest ECG: NSR with incomplete RBBB and nondiagnoatic T changes.  Stress ECG: No significant ST segment change suggestive of ischemia; occasional PVC.  QPS Raw Data Images:  Normal; no motion artifact; normal heart/lung ratio. Stress Images:  Normal homogeneous uptake in all areas of the myocardium. Rest Images:  Normal homogeneous uptake in all areas of the myocardium. Subtraction (SDS):  Normal  Impression Exercise Capacity:  Lexiscan with no exercise. BP Response:  Normal blood pressure response. Clinical Symptoms:  No significant symptoms noted. ECG Impression:  No significant ST segment change suggestive of ischemia; occasional PVC. Comparison with Prior Nuclear Study: No significant change from previous study  Overall Impression:  Normal stress nuclear study. Low risk stress nuclear study.  LV Wall Motion:  Not gated secondary to ectopy.   Lennette Bihari, MD  11/29/2012 12:27 PM

## 2013-01-20 ENCOUNTER — Encounter (HOSPITAL_COMMUNITY): Payer: Self-pay | Admitting: Pharmacy Technician

## 2013-01-20 ENCOUNTER — Encounter (HOSPITAL_COMMUNITY)
Admission: RE | Admit: 2013-01-20 | Discharge: 2013-01-20 | Disposition: A | Discharge status: Home / Self Care | Payer: Worker's Compensation | Source: Ambulatory Visit | Attending: Orthopedic Surgery | Admitting: Orthopedic Surgery

## 2013-01-20 ENCOUNTER — Encounter (HOSPITAL_COMMUNITY): Payer: Self-pay

## 2013-01-20 HISTORY — DX: Sleep apnea, unspecified: G47.30

## 2013-01-20 LAB — CBC
HCT: 42.3 % (ref 39.0–52.0)
MCHC: 35.9 g/dL (ref 30.0–36.0)
Platelets: 151 10*3/uL (ref 150–400)
RDW: 12.6 % (ref 11.5–15.5)

## 2013-01-20 LAB — BASIC METABOLIC PANEL
BUN: 20 mg/dL (ref 6–23)
GFR calc Af Amer: 84 mL/min — ABNORMAL LOW (ref >90)
GFR calc non Af Amer: 72 mL/min — ABNORMAL LOW (ref >90)
Potassium: 4.5 mEq/L (ref 3.5–5.1)

## 2013-01-20 LAB — SURGICAL PCR SCREEN
MRSA, PCR: NEGATIVE
Staphylococcus aureus: NEGATIVE

## 2013-01-20 NOTE — Progress Notes (Signed)
Stress Test 11/29/12  EPIC  Office visit 05/28/12 on chart with cardiologist- Dr Bishop Limbo  EKG 05/28/12 on chart  CXR 7/13 EPIC

## 2013-01-20 NOTE — Progress Notes (Signed)
Baseline EKG of Stress Test done 11/29/12 on chart.   

## 2013-01-20 NOTE — Patient Instructions (Signed)
Jerry Montgomery  01/20/2013   Your procedure is scheduled on:  01/22/13   Report to [facility] at    0730  AM.  Call this number if you have problems the morning of surgery: [phone]   Remember:   Do not eat food or drink liquids after midnight.   Take these medicines the morning of surgery with A SIP OF WATER:    Do not wear jewelry,   Do not wear lotions, powders, or perfumes.   . Men may shave face and neck.  Do not bring valuables to the hospital.  Contacts, dentures or bridgework may not be worn into surgery.      Patients discharged the day of surgery will not be allowed to drive  home.  Name and phone number of your driver:   SEE [facility]G INSTRUCTION SHEET    Please read over the following fact sheets that you were given: MRSA Information, coughing and deep breathing exercises, leg exercises               Failure to comply with these instructions may result in cancellation of your surgery.                Patient Signature ____________________________              Nurse Signature _____________________________

## 2013-01-22 ENCOUNTER — Ambulatory Visit (HOSPITAL_COMMUNITY): Payer: Worker's Compensation | Admitting: Anesthesiology

## 2013-01-22 ENCOUNTER — Encounter (HOSPITAL_COMMUNITY)
Admission: RE | Disposition: A | Discharge status: Home / Self Care | Payer: Self-pay | Source: Ambulatory Visit | Attending: Orthopedic Surgery

## 2013-01-22 ENCOUNTER — Encounter (HOSPITAL_COMMUNITY): Payer: Self-pay | Admitting: *Deleted

## 2013-01-22 ENCOUNTER — Ambulatory Visit (HOSPITAL_COMMUNITY)
Admission: RE | Admit: 2013-01-22 | Discharge: 2013-01-22 | Disposition: A | Discharge status: Home / Self Care | Payer: Worker's Compensation | Source: Ambulatory Visit | Attending: Orthopedic Surgery | Admitting: Orthopedic Surgery

## 2013-01-22 ENCOUNTER — Encounter (HOSPITAL_COMMUNITY): Payer: Self-pay | Admitting: Anesthesiology

## 2013-01-22 DIAGNOSIS — I499 Cardiac arrhythmia, unspecified: Secondary | ICD-10-CM | POA: Insufficient documentation

## 2013-01-22 DIAGNOSIS — M949 Disorder of cartilage, unspecified: Secondary | ICD-10-CM | POA: Diagnosis present

## 2013-01-22 DIAGNOSIS — I2510 Atherosclerotic heart disease of native coronary artery without angina pectoris: Secondary | ICD-10-CM | POA: Insufficient documentation

## 2013-01-22 DIAGNOSIS — J449 Chronic obstructive pulmonary disease, unspecified: Secondary | ICD-10-CM | POA: Insufficient documentation

## 2013-01-22 DIAGNOSIS — M238X9 Other internal derangements of unspecified knee: Secondary | ICD-10-CM | POA: Insufficient documentation

## 2013-01-22 DIAGNOSIS — Z87828 Personal history of other (healed) physical injury and trauma: Secondary | ICD-10-CM | POA: Insufficient documentation

## 2013-01-22 DIAGNOSIS — I10 Essential (primary) hypertension: Secondary | ICD-10-CM | POA: Insufficient documentation

## 2013-01-22 DIAGNOSIS — Z79899 Other long term (current) drug therapy: Secondary | ICD-10-CM | POA: Insufficient documentation

## 2013-01-22 DIAGNOSIS — J4489 Other specified chronic obstructive pulmonary disease: Secondary | ICD-10-CM | POA: Insufficient documentation

## 2013-01-22 DIAGNOSIS — Z794 Long term (current) use of insulin: Secondary | ICD-10-CM | POA: Insufficient documentation

## 2013-01-22 DIAGNOSIS — M2350 Chronic instability of knee, unspecified knee: Secondary | ICD-10-CM | POA: Insufficient documentation

## 2013-01-22 DIAGNOSIS — E119 Type 2 diabetes mellitus without complications: Secondary | ICD-10-CM | POA: Insufficient documentation

## 2013-01-22 DIAGNOSIS — J8410 Pulmonary fibrosis, unspecified: Secondary | ICD-10-CM | POA: Insufficient documentation

## 2013-01-22 DIAGNOSIS — Z87891 Personal history of nicotine dependence: Secondary | ICD-10-CM | POA: Insufficient documentation

## 2013-01-22 DIAGNOSIS — M6580 Other synovitis and tenosynovitis, unspecified site: Secondary | ICD-10-CM | POA: Insufficient documentation

## 2013-01-22 HISTORY — PX: KNEE ARTHROSCOPY: SHX127

## 2013-01-22 LAB — GLUCOSE, CAPILLARY: Glucose-Capillary: 157 mg/dL — ABNORMAL HIGH (ref 70–99)

## 2013-01-22 SURGERY — ARTHROSCOPY, KNEE
Anesthesia: General | Site: Knee | Laterality: Right | Wound class: Clean

## 2013-01-22 MED ORDER — OXYCODONE HCL 5 MG PO TABS
5.0000 mg | ORAL_TABLET | ORAL | Status: DC | PRN
  Administered 2013-01-22: 5 mg via ORAL
  Filled 2013-01-22: qty 1

## 2013-01-22 MED ORDER — CHLORHEXIDINE GLUCONATE 4 % EX LIQD: 60.0000 mL | Freq: Once | CUTANEOUS | Status: DC

## 2013-01-22 MED ORDER — CEFAZOLIN SODIUM-DEXTROSE 2-3 GM-% IV SOLR
INTRAVENOUS | Status: AC
  Filled 2013-01-22: qty 50

## 2013-01-22 MED ORDER — CEFAZOLIN SODIUM 1-5 GM-% IV SOLN
INTRAVENOUS | Status: AC
  Filled 2013-01-22: qty 50

## 2013-01-22 MED ORDER — LACTATED RINGERS IV SOLN
INTRAVENOUS | Status: DC
  Administered 2013-01-22: 09:00:00 via INTRAVENOUS

## 2013-01-22 MED ORDER — ONDANSETRON HCL 4 MG/2ML IJ SOLN
INTRAMUSCULAR | Status: DC | PRN
  Administered 2013-01-22: 4 mg via INTRAVENOUS

## 2013-01-22 MED ORDER — ACETAMINOPHEN 10 MG/ML IV SOLN
INTRAVENOUS | Status: DC | PRN
  Administered 2013-01-22: 1000 mg via INTRAVENOUS

## 2013-01-22 MED ORDER — OXYCODONE-ACETAMINOPHEN 5-325 MG PO TABS
1.0000 | ORAL_TABLET | ORAL | Status: DC | PRN
  Administered 2013-01-22: 1 via ORAL
  Filled 2013-01-22: qty 1

## 2013-01-22 MED ORDER — BACITRACIN ZINC 500 UNIT/GM EX OINT
TOPICAL_OINTMENT | CUTANEOUS | Status: AC
  Filled 2013-01-22: qty 15

## 2013-01-22 MED ORDER — ACETAMINOPHEN 10 MG/ML IV SOLN
INTRAVENOUS | Status: AC
  Filled 2013-01-22: qty 100

## 2013-01-22 MED ORDER — EPHEDRINE SULFATE 50 MG/ML IJ SOLN
INTRAMUSCULAR | Status: DC | PRN
  Administered 2013-01-22 (×3): 10 mg via INTRAVENOUS

## 2013-01-22 MED ORDER — FENTANYL CITRATE 0.05 MG/ML IJ SOLN: 25.0000 ug | INTRAMUSCULAR | Status: DC | PRN

## 2013-01-22 MED ORDER — LACTATED RINGERS IV SOLN
INTRAVENOUS | Status: DC
  Administered 2013-01-22: 11:00:00 via INTRAVENOUS

## 2013-01-22 MED ORDER — BACITRACIN ZINC 500 UNIT/GM EX OINT
TOPICAL_OINTMENT | CUTANEOUS | Status: DC | PRN
  Administered 2013-01-22: 1 via TOPICAL

## 2013-01-22 MED ORDER — OXYCODONE-ACETAMINOPHEN 10-325 MG PO TABS: 1.0000 | ORAL_TABLET | ORAL | Status: DC | PRN

## 2013-01-22 MED ORDER — PROPOFOL 10 MG/ML IV BOLUS
INTRAVENOUS | Status: DC | PRN
  Administered 2013-01-22: 120 mg via INTRAVENOUS

## 2013-01-22 MED ORDER — LACTATED RINGERS IV SOLN: INTRAVENOUS | Status: DC

## 2013-01-22 MED ORDER — FENTANYL CITRATE 0.05 MG/ML IJ SOLN
INTRAMUSCULAR | Status: DC | PRN
  Administered 2013-01-22: 100 ug via INTRAVENOUS
  Administered 2013-01-22: 25 ug via INTRAVENOUS

## 2013-01-22 MED ORDER — CEFAZOLIN SODIUM 10 G IJ SOLR
3.0000 g | INTRAMUSCULAR | Status: AC
  Administered 2013-01-22: 2 g via INTRAVENOUS

## 2013-01-22 MED ORDER — LACTATED RINGERS IR SOLN
Status: DC | PRN
  Administered 2013-01-22: 6000 mL

## 2013-01-22 MED ORDER — BUPIVACAINE-EPINEPHRINE PF 0.25-1:200000 % IJ SOLN
INTRAMUSCULAR | Status: AC
  Filled 2013-01-22: qty 30

## 2013-01-22 MED ORDER — BUPIVACAINE-EPINEPHRINE 0.25% -1:200000 IJ SOLN
INTRAMUSCULAR | Status: DC | PRN
  Administered 2013-01-22: 30 mL

## 2013-01-22 SURGICAL SUPPLY — 22 items
BANDAGE ELASTIC 4 VELCRO ST LF (GAUZE/BANDAGES/DRESSINGS) ×2 IMPLANT
BLADE GREAT WHITE 4.2 (BLADE) ×2 IMPLANT
BNDG COHESIVE 6X5 TAN STRL LF (GAUZE/BANDAGES/DRESSINGS) ×2 IMPLANT
CLOTH BEACON ORANGE TIMEOUT ST (SAFETY) ×2 IMPLANT
DRAPE LG THREE QUARTER DISP (DRAPES) ×2 IMPLANT
DRSG PAD ABDOMINAL 8X10 ST (GAUZE/BANDAGES/DRESSINGS) ×8 IMPLANT
DURAPREP 26ML APPLICATOR (WOUND CARE) ×2 IMPLANT
GLOVE BIOGEL PI IND STRL 8 (GLOVE) ×1 IMPLANT
GLOVE BIOGEL PI INDICATOR 8 (GLOVE) ×1
GLOVE ECLIPSE 8.0 STRL XLNG CF (GLOVE) ×2 IMPLANT
GOWN STRL REIN XL XLG (GOWN DISPOSABLE) ×4 IMPLANT
MANIFOLD NEPTUNE II (INSTRUMENTS) ×2 IMPLANT
PACK ARTHROSCOPY WL (CUSTOM PROCEDURE TRAY) ×2 IMPLANT
PACK ICE MAXI GEL EZY WRAP (MISCELLANEOUS) ×2 IMPLANT
PAD MASON LEG HOLDER (PIN) ×2 IMPLANT
SET ARTHROSCOPY TUBING (MISCELLANEOUS) ×1
SET ARTHROSCOPY TUBING LN (MISCELLANEOUS) ×1 IMPLANT
SUT ETHILON 3 0 PS 1 (SUTURE) ×2 IMPLANT
TOWEL OR 17X26 10 PK STRL BLUE (TOWEL DISPOSABLE) ×2 IMPLANT
TUBING CONNECTING 10 (TUBING) ×2 IMPLANT
WAND 90 DEG TURBOVAC W/CORD (SURGICAL WAND) ×2 IMPLANT
WRAP KNEE MAXI GEL POST OP (GAUZE/BANDAGES/DRESSINGS) ×6 IMPLANT

## 2013-01-22 NOTE — Transfer of Care (Signed)
Immediate Anesthesia Transfer of Care Note  Patient: Jerry Montgomery  Procedure(s) Performed: Procedure(s): RIGHT ARTHROSCOPY KNEE WITH DEBRIDEMENT, abrasion chondroplasty of lateral tibial plateau, debridement of partial tear of ACL, menisectomy (Right)  Patient Location: PACU  Anesthesia Type:General  Level of Consciousness: awake, alert , oriented and patient cooperative  Airway & Oxygen Therapy: Patient Spontanous Breathing and Patient connected to face mask oxygen  Post-op Assessment: Report given to PACU RN and Post -op Vital signs reviewed and stable  Post vital signs: Reviewed and stable  Complications: No apparent anesthesia complications

## 2013-01-22 NOTE — H&P (Signed)
Jerry Montgomery is an 57 y.o. male.   Chief Complaint: right knee pain HPI: Ruffus is a 57 year old male who is a year and a half out from his quad repair. He had that quadriceps repair June 16, 2011, he is about a year and a half out. He saw Dr. Dannielle Huh for a second opinion. Dr. Darrelyn Hillock and Dr. Sherlean Foot are in agreement that despite a normal MRI in regards to meniscal tears, the only thing to do would be to do a diagnostic arthroscopy of his knee to see what else is causing the pain and popping in his knee.   Past Medical History  Diagnosis Date  . Diabetes mellitus   . Hypertension   . Hyperlipidemia   . Postinflammatory pulmonary fibrosis   . CAD (coronary artery disease)   . Pneumothorax on right 4/11  . Shortness of breath     with exertion   . Sleep apnea     does not use CPAP     Past Surgical History  Procedure Laterality Date  . Coronary stent placement    . Elbow surgery    . Rotator cuff repair    . Bilateral vats ablation      rt  . Cardiac catheterization    . Right knee surgery       2012     Family History  Problem Relation Age of Onset  . Breast cancer Mother   . Lung cancer Father   . Diabetes type II Mother    Social History:  reports that he quit smoking about 5 years ago. His smoking use included Cigarettes. He has a 52.5 pack-year smoking history. He has never used smokeless tobacco. He reports that  drinks alcohol. He reports that he does not use illicit drugs.  Allergies: No Active Allergies  Medications Prior to Admission  Medication Sig Dispense Refill  . aspirin 81 MG tablet Take 81 mg by mouth daily.      Marland Kitchen atorvastatin (LIPITOR) 40 MG tablet Take 40 mg by mouth every morning.       . clopidogrel (PLAVIX) 75 MG tablet Take 75 mg by mouth every morning.       . furosemide (LASIX) 20 MG tablet Take 20 mg by mouth daily as needed (fluid).       Marland Kitchen glyBURIDE (DIABETA) 5 MG tablet Take 5 mg by mouth 2 (two) times daily with a meal.      .  insulin aspart (NOVOLOG) 100 UNIT/ML injection Inject 20 Units into the skin 2 (two) times daily before a meal. Patient takes Novolog at 11am and  1900pm      . insulin glargine (LANTUS) 100 UNIT/ML injection Inject 60 Units into the skin daily. Patient takes in the am      . isosorbide mononitrate (IMDUR) 60 MG 24 hr tablet Take 60 mg by mouth every morning.       Marland Kitchen losartan (COZAAR) 100 MG tablet Take 100 mg by mouth every morning.      . metFORMIN (GLUCOPHAGE) 850 MG tablet Take 850 mg by mouth daily with breakfast.      . metoprolol (TOPROL-XL) 200 MG 24 hr tablet Take 200 mg by mouth every morning.      . niacin (NIASPAN) 1000 MG CR tablet Take 1,000 mg by mouth at bedtime.      . nitroGLYCERIN (NITROSTAT) 0.4 MG SL tablet Place 0.4 mg under the tongue every 5 (five) minutes as needed for chest  pain.       . zolpidem (AMBIEN) 10 MG tablet Take 10 mg by mouth at bedtime as needed for sleep.         Results for orders placed during the hospital encounter of 01/20/13 (from the past 48 hour(s))  SURGICAL PCR SCREEN     Status: None   Collection Time    01/20/13  1:31 PM      Result Value Range   MRSA, PCR NEGATIVE  NEGATIVE   Staphylococcus aureus NEGATIVE  NEGATIVE   Comment:            The Xpert SA Assay (FDA     approved for NASAL specimens     in patients over 79 years of age),     is one component of     a comprehensive surveillance     program.  Test performance has     been validated by [employer] for patients greater     than or equal to 43 year old.     It is not intended     to diagnose infection nor to     guide or monitor treatment.  CBC     Status: None   Collection Time    01/20/13  2:20 PM      Result Value Range   WBC 6.3  4.0 - 10.5 K/uL   RBC 4.70  4.22 - 5.81 MIL/uL   Hemoglobin 15.2  13.0 - 17.0 g/dL   HCT 96.0  45.4 - 09.8 %   MCV 90.0  78.0 - 100.0 fL   MCH 32.3  26.0 - 34.0 pg   MCHC 35.9  30.0 - 36.0 g/dL   RDW 11.9  14.7 - 82.9 %   Platelets  151  150 - 400 K/uL  BASIC METABOLIC PANEL     Status: Abnormal   Collection Time    01/20/13  2:20 PM      Result Value Range   Sodium 133 (*) 135 - 145 mEq/L   Potassium 4.5  3.5 - 5.1 mEq/L   Chloride 98  96 - 112 mEq/L   CO2 21  19 - 32 mEq/L   Glucose, Bld 375 (*) 70 - 99 mg/dL   BUN 20  6 - 23 mg/dL   Creatinine, Ser 5.62  0.50 - 1.35 mg/dL   Calcium 9.3  8.4 - 13.0 mg/dL   GFR calc non Af Amer 72 (*) >90 mL/min   GFR calc Af Amer 84 (*) >90 mL/min   Comment:            The eGFR has been calculated     using the CKD EPI equation.     This calculation has not been     validated in all clinical     situations.     eGFR's persistently     <90 mL/min signify     possible Chronic Kidney Disease.    Review of Systems  Constitutional: Negative.   HENT: Negative.  Negative for neck pain.   Eyes: Negative.   Respiratory: Negative.   Cardiovascular: Negative.   Gastrointestinal: Negative.   Genitourinary: Negative.   Musculoskeletal: Positive for joint pain. Negative for myalgias, back pain and falls.       Right knee pain  Skin: Negative.   Neurological: Negative.   Endo/Heme/Allergies: Negative.   Psychiatric/Behavioral: Negative.     Blood pressure 112/74, pulse 81, temperature 97 F (36.1 C), resp. rate  20, SpO2 91.00%. Physical Exam  Constitutional: He is oriented to person, place, and time. He appears well-developed and well-nourished. No distress.  HENT:  Head: Normocephalic and atraumatic.  Right Ear: External ear normal.  Left Ear: External ear normal.  Nose: Nose normal.  Mouth/Throat: Oropharynx is clear and moist.  Eyes: Conjunctivae and EOM are normal.  Neck: Normal range of motion. Neck supple. No tracheal deviation present. No thyromegaly present.  Cardiovascular: Normal rate, regular rhythm, normal heart sounds and intact distal pulses.   No murmur heard. Respiratory: Effort normal and breath sounds normal. No respiratory distress. He has no  wheezes.  GI: Soft. Bowel sounds are normal. He exhibits no distension and no mass. There is no tenderness.  Musculoskeletal:       Right hip: Normal.       Left hip: Normal.       Right knee: He exhibits decreased range of motion. He exhibits no swelling, no effusion and no erythema. No tenderness found.       Left knee: Normal.       Right lower leg: He exhibits no tenderness and no swelling.       Left lower leg: He exhibits no tenderness and no swelling.       Legs: Lymphadenopathy:    He has no cervical adenopathy.  Neurological: He is alert and oriented to person, place, and time. He has normal strength and normal reflexes. No sensory deficit.  Skin: No rash noted. He is not diaphoretic. No erythema.  Psychiatric: He has a normal mood and affect. His behavior is normal.     Assessment/Plan Right knee pain The patient is scheduled for a right knee diagnostic arthroscopy with Dr. Darrelyn Hillock on Wednesday January 22, 2013. The procedure, risks, potential complications and rehab course are discussed in detail and the patient elects to proceed.The goals of this procedure are decreased pain and increased function.      Wende Longstreth LAUREN 01/22/2013, 7:52 AM

## 2013-01-22 NOTE — H&P (View-Only) (Signed)
Baseline EKG of Stress Test done 11/29/12 on chart.

## 2013-01-22 NOTE — Interval H&P Note (Signed)
History and Physical Interval Note:  01/22/2013 9:22 AM  [name]  has presented today for surgery, with the diagnosis of RIGHT KNEE MEDIAL MENISCAL TEAR  The various methods of treatment have been discussed with the patient and family. After consideration of risks, benefits and other options for treatment, the patient has consented to  Procedure(s): RIGHT ARTHROSCOPY KNEE WITH DEBRIDEMENT (Right) as a surgical intervention .  The patient's history has been reviewed, patient examined, no change in status, stable for surgery.  I have reviewed the patient's chart and labs.  Questions were answered to the patient's satisfaction.     Lalitha Ilyas A

## 2013-01-22 NOTE — Brief Op Note (Signed)
01/22/2013  10:33 AM  PATIENT:  Jerry Montgomery  57 y.o. male  PRE-OPERATIVE DIAGNOSIS:  RIGHT KNEE.Partial Tear of ACL and Chondral Defect ,Lateral Tibial Plateau and Chronic Synovitis.  POST-OPERATIVE DIAGNOSIS:  Same as Pre-Op  PROCEDURE:  Procedure(s): RIGHT ARTHROSCOPY KNEE WITH DEBRIDEMENT, abrasion chondroplasty of lateral tibial plateau, debridement of partial tear of ACL, menisectomy (Right) and Synovectomy of Medial Joint.  SURGEON:  Surgeon(s) and Role:    * Jacki Cones, MD - Primary  :   ASSISTANTS: OR Tech   ANESTHESIA:   general  EBL:  Total I/O In: 900 [I.V.:900] Out: -   BLOOD ADMINISTERED:none  DRAINS: none   LOCAL MEDICATIONS USED:  MARCAINE 30cc of 0.25% with Epinephrine.     SPECIMEN:  No Specimen  DISPOSITION OF SPECIMEN:  N/A  COUNTS:  YES  TOURNIQUET:  * No tourniquets in log *  DICTATION: .Other Dictation: Dictation Number [id number]  PLAN OF CARE: Discharge to home after PACU  PATIENT DISPOSITION:  PACU - hemodynamically stable.   Delay start of Pharmacological VTE agent (>24hrs) due to surgical blood loss or risk of bleeding: yes

## 2013-01-22 NOTE — Anesthesia Preprocedure Evaluation (Addendum)
Anesthesia Evaluation  Patient identified by MRN, date of birth, ID band Patient awake    Reviewed: Allergy & Precautions, H&P , NPO status , Patient's Chart, lab work & pertinent test results, reviewed documented beta blocker date and time   Airway Mallampati: III TM Distance: >3 FB Neck ROM: full    Dental no notable dental hx. (+) Teeth Intact and Dental Advisory Given   Pulmonary shortness of breath and with exertion, sleep apnea , pneumonia -, resolved, COPDformer smoker,  Pulmonary fibrosis.  Pneumothorax 4/11.  Bronchiectasis.  52 pack years smoking breath sounds clear to auscultation  Pulmonary exam normal       Cardiovascular hypertension, Pt. on home beta blockers + CAD and + Cardiac Stents + dysrhythmias Rhythm:regular Rate:Normal  Patient describes some kind of "inverted" heart rhythm for which he takes toprol   Neuro/Psych negative neurological ROS  negative psych ROS   GI/Hepatic negative GI ROS, Neg liver ROS,   Endo/Other  diabetes, Well Controlled, Type 2, Insulin Dependent and Oral Hypoglycemic Agents  Renal/GU negative Renal ROS  negative genitourinary   Musculoskeletal   Abdominal   Peds  Hematology negative hematology ROS (+)   Anesthesia Other Findings   Reproductive/Obstetrics negative OB ROS                          Anesthesia Physical Anesthesia Plan  ASA: III  Anesthesia Plan: General   Post-op Pain Management:    Induction: Intravenous  Airway Management Planned: LMA  Additional Equipment:   Intra-op Plan:   Post-operative Plan:   Informed Consent: I have reviewed the patients History and Physical, chart, labs and discussed the procedure including the risks, benefits and alternatives for the proposed anesthesia with the patient or authorized representative who has indicated his/her understanding and acceptance.   Dental Advisory Given  Plan Discussed  with: CRNA and Surgeon  Anesthesia Plan Comments:         Anesthesia Quick Evaluation

## 2013-01-22 NOTE — Anesthesia Postprocedure Evaluation (Signed)
  Anesthesia Post-op Note  Patient: Jerry Montgomery  Procedure(s) Performed: Procedure(s) (LRB): RIGHT ARTHROSCOPY KNEE WITH DEBRIDEMENT, abrasion chondroplasty of lateral tibial plateau, debridement of partial tear of ACL, menisectomy (Right)  Patient Location: PACU  Anesthesia Type: General  Level of Consciousness: awake and alert   Airway and Oxygen Therapy: Patient Spontanous Breathing  Post-op Pain: mild  Post-op Assessment: Post-op Vital signs reviewed, Patient's Cardiovascular Status Stable, Respiratory Function Stable, Patent Airway and No signs of Nausea or vomiting  Last Vitals:  Filed Vitals:   01/22/13 1100  BP: 100/58  Pulse: 64  Temp:   Resp: 12    Post-op Vital Signs: stable   Complications: No apparent anesthesia complications

## 2013-01-23 ENCOUNTER — Encounter (HOSPITAL_COMMUNITY): Payer: Self-pay | Admitting: Orthopedic Surgery

## 2013-01-23 NOTE — Op Note (Signed)
NAMEWARREN, LINDAHL               ACCOUNT NO.:  [id number]  MEDICAL RECORD NO.:  [id number]  LOCATION:  [facility]                         FACILITY:  [facility]  PHYSICIAN:  Georges Lynch. Darwyn Ponzo, M.D.DATE OF BIRTH:  [birth year]  DATE OF PROCEDURE:  01/22/2013 DATE OF DISCHARGE:  01/22/2013                              OPERATIVE REPORT   SURGEON:  Windy Fast A. Darrelyn Hillock, M.D.  OPERATIVE ASSISTANT:  OR Tech.  PREOPERATIVE DIAGNOSES: 1. Previous reconstruction of a complete disruption of his quadriceps     tendon on the right. 2. Questionable partial tear of the ACL old. 3. Chondral defect, lateral tibial plateau. 4. Rule out medial meniscal tear.  POSTOPERATIVE DIAGNOSES: 1. Previous reconstruction of a complete disruption of his quadriceps     tendon on the right. 2. Questionable partial tear of the ACL old. 3. Chondral defect, lateral tibial plateau. 4. Chronic synovitis in the medial joint.  PROCEDURE:  Under general anesthesia, routine orthopedic prep and draping in the right lower extremity was carried out.  The appropriate time-out was carried out prior to making the incision.  I also marked the appropriate right leg in the holding area.  At this time, the patient had 3 g of IV Ancef.  The right leg was placed in a knee holder and then the prep and draping was carried out.  A small punctate incision made in suprapatellar pouch.  Inflow cannula was inserted. Knee was distended with saline.  Another small punctate incision made in the anterolateral joint and a complete diagnostic arthroscopy was carried out.  He had a lot of scar tissue into the knee.  I went in the lateral joint.  He had a large defect with a piece of articular cartilage in the lateral tibial plateau literally partially detached from the plateau in a nonweightbearing surface.  I then did a abrasion chondroplasty of this defect.  Note, I went and examined the ACL and PCL.  The ACL with a partial tear medially with a large  strip which is loose of the ACL.  I did a partial debridement of the ACL.  The main part of the ACL was intact.  The medial joint and medial meniscus was intact.  He has some mild early arthritic changes.  He had severe chronic synovitis.  I introduced the ArthroCare and did a synovectomy. The suprapatellar pouch has had a classical tissue scar type tissue that we see after repair.  I thoroughly irrigated out the knee, reinspected the knee.  Lateral meniscus was definitely intact as well the medial meniscus with the main issue is as I mentioned above.  All the fluid was removed.  I closed all 3 punctate incisions with 3-0 nylon suture.  I injected 30 mL of 0.25% Marcaine and epinephrine in a knee joint and a sterile Neosporin bundle dressing was applied.  The patient left the operative room in satisfactory condition.          ______________________________ Georges Lynch Darrelyn Hillock, M.D.     RAG/MEDQ  D:  01/22/2013  T:  01/23/2013  Job:  [id number]

## 2013-01-24 ENCOUNTER — Telehealth: Payer: Self-pay | Admitting: Family Medicine

## 2013-01-24 MED ORDER — ZOLPIDEM TARTRATE 10 MG PO TABS: 10.0000 mg | ORAL_TABLET | Freq: Every evening | ORAL | Status: DC | PRN

## 2013-01-24 NOTE — Telephone Encounter (Signed)
?  ok to refill °

## 2013-01-24 NOTE — Telephone Encounter (Signed)
Ok to refill #30 with 2 refills.  

## 2013-01-24 NOTE — Telephone Encounter (Signed)
rx refilled.

## 2013-02-07 ENCOUNTER — Other Ambulatory Visit: Payer: Self-pay | Admitting: Family Medicine

## 2013-02-19 ENCOUNTER — Other Ambulatory Visit (INDEPENDENT_AMBULATORY_CARE_PROVIDER_SITE_OTHER): Payer: 59

## 2013-02-19 DIAGNOSIS — Z0000 Encounter for general adult medical examination without abnormal findings: Secondary | ICD-10-CM

## 2013-02-19 DIAGNOSIS — E119 Type 2 diabetes mellitus without complications: Secondary | ICD-10-CM

## 2013-02-19 DIAGNOSIS — E785 Hyperlipidemia, unspecified: Secondary | ICD-10-CM

## 2013-02-19 DIAGNOSIS — I10 Essential (primary) hypertension: Secondary | ICD-10-CM

## 2013-02-19 LAB — COMPLETE METABOLIC PANEL WITH GFR
ALT: 24 U/L (ref 0–53)
AST: 20 U/L (ref 0–37)
Albumin: 4.3 g/dL (ref 3.5–5.2)
Alkaline Phosphatase: 52 U/L (ref 39–117)
BUN: 28 mg/dL — ABNORMAL HIGH (ref 6–23)
Creat: 1.5 mg/dL — ABNORMAL HIGH (ref 0.50–1.35)
Potassium: 4.7 mEq/L (ref 3.5–5.3)

## 2013-02-19 LAB — LIPID PANEL
Cholesterol: 122 mg/dL (ref 0–200)
LDL Cholesterol: 52 mg/dL (ref 0–99)
Total CHOL/HDL Ratio: 3.7 Ratio
VLDL: 37 mg/dL (ref 0–40)

## 2013-02-22 ENCOUNTER — Other Ambulatory Visit: Payer: Self-pay | Admitting: Family Medicine

## 2013-02-26 ENCOUNTER — Ambulatory Visit (INDEPENDENT_AMBULATORY_CARE_PROVIDER_SITE_OTHER): Payer: 59 | Admitting: Family Medicine

## 2013-02-26 ENCOUNTER — Encounter: Payer: Self-pay | Admitting: Family Medicine

## 2013-02-26 VITALS — BP 110/70 | HR 68 | Temp 98.0°F | Resp 20 | Wt 267.0 lb

## 2013-02-26 DIAGNOSIS — E119 Type 2 diabetes mellitus without complications: Secondary | ICD-10-CM

## 2013-02-26 DIAGNOSIS — I10 Essential (primary) hypertension: Secondary | ICD-10-CM

## 2013-02-26 DIAGNOSIS — E785 Hyperlipidemia, unspecified: Secondary | ICD-10-CM

## 2013-02-26 MED ORDER — INSULIN ASPART 100 UNIT/ML ~~LOC~~ SOLN: SUBCUTANEOUS | Status: DC

## 2013-02-26 NOTE — Progress Notes (Signed)
Subjective:    Patient ID: Jerry Montgomery, male    DOB: 1955-11-01, 57 y.o.   MRN: [id number]  HPI His sister for followup of his diabetes mellitus type 2. He is currently taking Lantus 60 units once a day. He is also taking NovoLog 20 units with supper. He is still taking glyburide 5 mg twice a day, and metformin 850 mg by mouth twice a day. Unfortunately his hemoglobin A1c has risen from 7.2-8.6.  The patient has become more inactive recently. He has lost his job. There's been more dietary indiscretion. Do to financial stress he has not been checking his sugars because of the cost of the test strips. Therefore his A1c has risen.  I also reviewed his fasting lipid panel.  Triglycerides are slightly elevated at 186, HDL is low at 33, LDL is excellent at 52.  He has a history of coronary artery disease. His goal LDL is less than 70. He is still taking his aspirin and Plavix on a daily basis. He denies any chest pain shortness of breath or dyspnea on exertion. His blood pressure is current well controlled on his current medications at 110/70. Past Medical History  Diagnosis Date  . Diabetes mellitus   . Hypertension   . Hyperlipidemia   . Postinflammatory pulmonary fibrosis   . CAD (coronary artery disease)   . Pneumothorax on right 4/11  . Shortness of breath     with exertion   . Sleep apnea     does not use CPAP    Current Outpatient Prescriptions on File Prior to Visit  Medication Sig Dispense Refill  . aspirin 81 MG tablet Take 81 mg by mouth daily.      Marland Kitchen atorvastatin (LIPITOR) 40 MG tablet Take 40 mg by mouth every morning.       . clopidogrel (PLAVIX) 75 MG tablet Take 75 mg by mouth every morning.       . furosemide (LASIX) 20 MG tablet Take 20 mg by mouth daily as needed (fluid).       Marland Kitchen glyBURIDE (DIABETA) 5 MG tablet Take 5 mg by mouth 2 (two) times daily with a meal.      . isosorbide mononitrate (IMDUR) 60 MG 24 hr tablet Take 60 mg by mouth every morning.       Marland Kitchen losartan  (COZAAR) 100 MG tablet Take 100 mg by mouth every morning.      . metFORMIN (GLUCOPHAGE) 850 MG tablet Take 850 mg by mouth 2 (two) times daily with a meal.       . metoprolol (TOPROL-XL) 200 MG 24 hr tablet Take 200 mg by mouth every morning.      . niacin (NIASPAN) 1000 MG CR tablet Take 1,000 mg by mouth at bedtime.      . nitroGLYCERIN (NITROSTAT) 0.4 MG SL tablet Place 0.4 mg under the tongue every 5 (five) minutes as needed for chest pain.       Marland Kitchen oxyCODONE-acetaminophen (PERCOCET) 10-325 MG per tablet Take 1 tablet by mouth every 4 (four) hours as needed for pain.  40 tablet  0  . zolpidem (AMBIEN) 10 MG tablet Take 1 tablet (10 mg total) by mouth at bedtime as needed for sleep.  30 tablet  2   No current facility-administered medications on file prior to visit.   No Known Allergies History   Social History  . Marital Status: Divorced    Spouse Name: N/A    Number of Children: 1  .  Years of Education: N/A   Occupational History  .  [employer]   Social History Main Topics  . Smoking status: Former Smoker -- 1.50 packs/day for 35 years    Types: Cigarettes    Quit date: 10/24/2007  . Smokeless tobacco: Never Used  . Alcohol Use: Yes     Comment: social  . Drug Use: No  . Sexually Active: Not on file   Other Topics Concern  . Not on file   Social History Narrative  . No narrative on file      Review of Systems  All other systems reviewed and are negative.       Objective:   Physical Exam  Constitutional: He is oriented to person, place, and time. He appears well-developed and well-nourished.  Cardiovascular: Normal rate, regular rhythm, normal heart sounds and intact distal pulses.   No murmur heard. Pulmonary/Chest: Effort normal and breath sounds normal. No respiratory distress. He has no wheezes. He has no rales. He exhibits no tenderness.  Abdominal: Soft. Bowel sounds are normal. He exhibits no distension and no mass. There is no tenderness. There  is no rebound and no guarding.  Neurological: He is alert and oriented to person, place, and time. He has normal reflexes. He displays normal reflexes. No cranial nerve deficit. He exhibits normal muscle tone. Coordination normal.  Skin: Skin is warm. No rash noted. No erythema. No pallor.          Assessment & Plan:  1. Type II or unspecified type diabetes mellitus without mention of complication, not stated as uncontrolled Add NovoLog 10 units with breakfast and lunch. Otherwise continue present doses of her medication. Asked him to start checking his fasting blood sugar and two-hour postprandial sugars. And record these and bring them for my review in one week psychiatric medication further.  2. Other and unspecified hyperlipidemia Recommended low carbohydrate low saturated fat diet to address his elevated triglycerides. Also recommended increasing aerobic exercise to address his low HDL. We'll continue Lipitor 40 mg by mouth daily.  3. Unspecified essential hypertension Blood pressure is controlled. Continue current medications.

## 2013-04-21 ENCOUNTER — Other Ambulatory Visit: Payer: Self-pay | Admitting: Family Medicine

## 2013-05-06 ENCOUNTER — Other Ambulatory Visit: Payer: Self-pay | Admitting: Family Medicine

## 2013-06-05 ENCOUNTER — Ambulatory Visit (INDEPENDENT_AMBULATORY_CARE_PROVIDER_SITE_OTHER): Payer: 59 | Admitting: Family Medicine

## 2013-06-05 ENCOUNTER — Encounter: Payer: Self-pay | Admitting: Family Medicine

## 2013-06-05 ENCOUNTER — Other Ambulatory Visit: Payer: Self-pay | Admitting: Family Medicine

## 2013-06-05 ENCOUNTER — Ambulatory Visit
Admission: RE | Admit: 2013-06-05 | Discharge: 2013-06-05 | Disposition: A | Discharge status: Home / Self Care | Payer: 59 | Source: Ambulatory Visit | Attending: Family Medicine | Admitting: Family Medicine

## 2013-06-05 VITALS — BP 130/70 | HR 76 | Temp 97.6°F | Resp 24 | Wt 265.0 lb

## 2013-06-05 DIAGNOSIS — J449 Chronic obstructive pulmonary disease, unspecified: Secondary | ICD-10-CM | POA: Insufficient documentation

## 2013-06-05 DIAGNOSIS — R0989 Other specified symptoms and signs involving the circulatory and respiratory systems: Secondary | ICD-10-CM

## 2013-06-05 DIAGNOSIS — J948 Other specified pleural conditions: Secondary | ICD-10-CM

## 2013-06-05 DIAGNOSIS — R0689 Other abnormalities of breathing: Secondary | ICD-10-CM

## 2013-06-05 DIAGNOSIS — R0600 Dyspnea, unspecified: Secondary | ICD-10-CM

## 2013-06-05 DIAGNOSIS — J441 Chronic obstructive pulmonary disease with (acute) exacerbation: Secondary | ICD-10-CM

## 2013-06-05 MED ORDER — AZITHROMYCIN 250 MG PO TABS: ORAL_TABLET | ORAL | Status: DC

## 2013-06-05 MED ORDER — ALBUTEROL SULFATE HFA 108 (90 BASE) MCG/ACT IN AERS: 2.0000 | INHALATION_SPRAY | Freq: Four times a day (QID) | RESPIRATORY_TRACT | Status: DC | PRN

## 2013-06-05 MED ORDER — PREDNISONE 20 MG PO TABS: 40.0000 mg | ORAL_TABLET | Freq: Every day | ORAL | Status: DC

## 2013-06-05 MED ORDER — ALBUTEROL SULFATE (2.5 MG/3ML) 0.083% IN NEBU
2.5000 mg | INHALATION_SOLUTION | Freq: Once | RESPIRATORY_TRACT | Status: AC
  Administered 2013-06-05: 2.5 mg via RESPIRATORY_TRACT

## 2013-06-05 MED ORDER — "SYRINGE/NEEDLE (DISP) 25G X 5/8"" 1 ML MISC": Status: DC

## 2013-06-05 MED ORDER — METHYLPREDNISOLONE ACETATE 80 MG/ML IJ SUSP
80.0000 mg | Freq: Once | INTRAMUSCULAR | Status: AC
  Administered 2013-06-05: 80 mg via INTRAMUSCULAR

## 2013-06-05 MED ORDER — "PEN NEEDLES 5/16"" 31G X 8 MM MISC": Status: DC

## 2013-06-05 MED ORDER — IPRATROPIUM BROMIDE 0.02 % IN SOLN
0.5000 mg | Freq: Once | RESPIRATORY_TRACT | Status: AC
  Administered 2013-06-05: 0.5 mg via RESPIRATORY_TRACT

## 2013-06-05 NOTE — Progress Notes (Signed)
Subjective:    Patient ID: Jerry Montgomery, male    DOB: 1956-06-05, 57 y.o.   MRN: [id number]  HPI Patient reports to 3 days increasing shortness of breath and dyspnea on exertion. He is also having a cough productive of yellow and brown mucus. He is having right-sided pleurisy around his ribs in his mid axillary line. He denies any hemoptysis. He denies any fever or chills. He has been wheezing. He is getting short of breath with minimal exertion walking only 10 or 15 feet. His weight is actually down 2 pounds from his last office visit. He has no evidence of fluid overload on his exam today. He has trace pitting edema at both ankles. He denies orthopnea or paroxysmal nocturnal dyspnea. He denies angina. He denies chest pain. He is having some low back pain. He has a significant past medical history of coronary artery disease, COPD, and pulmonary fibrosis. His pulse oximetry was 90% on room air at arrival. Past Medical History  Diagnosis Date  . Diabetes mellitus   . Hypertension   . Hyperlipidemia   . Postinflammatory pulmonary fibrosis   . CAD (coronary artery disease)   . Pneumothorax on right 4/11  . Shortness of breath     with exertion   . Sleep apnea     does not use CPAP   . COPD (chronic obstructive pulmonary disease)    Past Surgical History  Procedure Laterality Date  . Coronary stent placement    . Elbow surgery    . Rotator cuff repair    . Bilateral vats ablation      rt  . Cardiac catheterization    . Right knee surgery       2012   . Knee arthroscopy Right 01/22/2013    Procedure: RIGHT ARTHROSCOPY KNEE WITH DEBRIDEMENT, abrasion chondroplasty of lateral tibial plateau, debridement of partial tear of ACL, menisectomy;  Surgeon: Jacki Cones, MD;  Location: [facility];  Service: Orthopedics;  Laterality: Right;   Current Outpatient Prescriptions on File Prior to Visit  Medication Sig Dispense Refill  . aspirin 81 MG tablet Take 81 mg by mouth daily.      Marland Kitchen  atorvastatin (LIPITOR) 40 MG tablet Take 1 tablet by mouth  every night at bedtime  90 tablet  3  . clopidogrel (PLAVIX) 75 MG tablet Take 1 tablet by mouth  every day  90 tablet  3  . furosemide (LASIX) 20 MG tablet Take 20 mg by mouth daily as needed (fluid).       Marland Kitchen glyBURIDE (DIABETA) 5 MG tablet Take 1 tablet by mouth two  times daily  180 tablet  3  . insulin aspart (NOVOLOG) 100 UNIT/ML injection 10 units with breakfast and lunch, 20 units With supper  3 vial  5  . isosorbide mononitrate (IMDUR) 60 MG 24 hr tablet Take 1 and 1/2 tablets by  mouth every day  135 tablet  3  . losartan (COZAAR) 100 MG tablet Take 1 tablet by mouth  every day  90 tablet  3  . metFORMIN (GLUCOPHAGE) 850 MG tablet Take 1 tablet by mouth two  times daily  180 tablet  3  . metoprolol (TOPROL-XL) 200 MG 24 hr tablet Take 1/2 tablet by mouth  every day  45 tablet  3  . niacin (NIASPAN) 1000 MG CR tablet Take 1 tablet by mouth  every day  90 tablet  3  . nitroGLYCERIN (NITROSTAT) 0.4 MG SL tablet Place 0.4 mg  under the tongue every 5 (five) minutes as needed for chest pain.       Marland Kitchen oxyCODONE-acetaminophen (PERCOCET) 10-325 MG per tablet Take 1 tablet by mouth every 4 (four) hours as needed for pain.  40 tablet  0  . zolpidem (AMBIEN) 10 MG tablet TAKE 1 TABLET BY MOUTH AS NEEDED FOR SLEEP  30 tablet  1   No current facility-administered medications on file prior to visit.   No Known Allergies History   Social History  . Marital Status: Divorced    Spouse Name: N/A    Number of Children: 1  . Years of Education: N/A   Occupational History  .  [employer]   Social History Main Topics  . Smoking status: Former Smoker -- 1.50 packs/day for 35 years    Types: Cigarettes    Quit date: 10/24/2007  . Smokeless tobacco: Never Used  . Alcohol Use: Yes     Comment: social  . Drug Use: No  . Sexual Activity: Not on file   Other Topics Concern  . Not on file   Social History Narrative  . No narrative on  file      Review of Systems  All other systems reviewed and are negative.       Objective:   Physical Exam  Vitals reviewed. Constitutional: He appears well-developed and well-nourished.  Neck: Neck supple. No JVD present. No thyromegaly present.  Cardiovascular: Normal rate, regular rhythm and normal heart sounds.   No murmur heard. Pulmonary/Chest: No accessory muscle usage. Not tachypneic. No respiratory distress. He has decreased breath sounds. He has no wheezes. He has no rhonchi. He has no rales.  Abdominal: Soft. Bowel sounds are normal. He exhibits no distension. There is no tenderness. There is no rebound and no guarding.  Musculoskeletal: He exhibits edema.  Lymphadenopathy:    He has no cervical adenopathy.   he has markedly decreased breath sounds in all 4 lung quadrants. There is no audible wheezing. He also has trace bipedal edema.         Assessment & Plan:  1. Dyspnea and respiratory abnormality Patient was given a Combivent neb times one in the office. His pulse oximetry improved to 92% on room air. His lungs sounded clearer with improved breath sounds throughout. - albuterol (PROVENTIL) (2.5 MG/3ML) 0.083% nebulizer solution 2.5 mg; Take 3 mL (2.5 mg total) by nebulization once. - ipratropium (ATROVENT) nebulizer solution 0.5 mg; Take 2.5 mL (0.5 mg total) by nebulization once. - DG Chest 2 View; Future  2. COPD exacerbation I will treat the patient empirically as a COPD exacerbation. I gave the patient 80 mg of Depo-Medrol today. He is to begin a Z-Pak and start albuterol 2 puffs every 6 hours inhaled as needed.  I will see the patient back tomorrow. He is to begin the prednisone 40 mg by mouth daily for 7 days starting tomorrow. Send the patient for a chest x-ray immediately.  Obviously if there are abnormalities on the chest x-ray the plan will change. - methylPREDNISolone acetate (DEPO-MEDROL) injection 80 mg; Inject 1 mL (80 mg total) into the muscle  once. - azithromycin (ZITHROMAX) 250 MG tablet; 2 tabs poqday 1, 1 tab poqday 2-5  Dispense: 6 tablet; Refill: 0 - predniSONE (DELTASONE) 20 MG tablet; Take 2 tablets (40 mg total) by mouth daily.  Dispense: 14 tablet; Refill: 0 - albuterol (PROVENTIL HFA;VENTOLIN HFA) 108 (90 BASE) MCG/ACT inhaler; Inhale 2 puffs into the lungs every 6 (six) hours as  needed for wheezing.  Dispense: 1 Inhaler; Refill: 0

## 2013-06-05 NOTE — Telephone Encounter (Signed)
Rx Refilled  

## 2013-06-06 ENCOUNTER — Encounter: Payer: Self-pay | Admitting: Family Medicine

## 2013-06-06 ENCOUNTER — Other Ambulatory Visit: Payer: Self-pay | Admitting: Family Medicine

## 2013-06-06 ENCOUNTER — Ambulatory Visit (INDEPENDENT_AMBULATORY_CARE_PROVIDER_SITE_OTHER): Payer: 59 | Admitting: Family Medicine

## 2013-06-06 VITALS — BP 140/90 | HR 94 | Temp 97.5°F | Resp 24 | Wt 265.0 lb

## 2013-06-06 DIAGNOSIS — R0602 Shortness of breath: Secondary | ICD-10-CM

## 2013-06-06 LAB — CBC WITH DIFFERENTIAL/PLATELET
Eosinophils Absolute: 0 10*3/uL (ref 0.0–0.7)
Eosinophils Relative: 0 % (ref 0–5)
HCT: 41.4 % (ref 39.0–52.0)
Lymphocytes Relative: 11 % — ABNORMAL LOW (ref 12–46)
Lymphs Abs: 1 10*3/uL (ref 0.7–4.0)
MCH: 31.7 pg (ref 26.0–34.0)
MCV: 91.2 fL (ref 78.0–100.0)
Monocytes Absolute: 1 10*3/uL (ref 0.1–1.0)
RBC: 4.54 MIL/uL (ref 4.22–5.81)
WBC: 9.5 10*3/uL (ref 4.0–10.5)

## 2013-06-06 LAB — BASIC METABOLIC PANEL
BUN: 23 mg/dL (ref 6–23)
CO2: 24 mEq/L (ref 19–32)
Chloride: 101 mEq/L (ref 96–112)
Creat: 1.33 mg/dL (ref 0.50–1.35)
Glucose, Bld: 542 mg/dL (ref 70–99)

## 2013-06-06 MED ORDER — GLUCOSE BLOOD VI STRP: ORAL_STRIP | Status: DC

## 2013-06-06 MED ORDER — ACCU-CHEK MULTICLIX LANCETS MISC: Status: DC

## 2013-06-06 MED ORDER — METHYLPREDNISOLONE ACETATE 80 MG/ML IJ SUSP
80.0000 mg | Freq: Once | INTRAMUSCULAR | Status: AC
  Administered 2013-06-06: 80 mg via INTRAMUSCULAR

## 2013-06-06 NOTE — Progress Notes (Signed)
Subjective:    Patient ID: Jerry Montgomery, male    DOB: 1956/10/21, 57 y.o.   MRN: [id number]  HPI 06/05/13 Patient reports to 3 days increasing shortness of breath and dyspnea on exertion. He is also having a cough productive of yellow and brown mucus. He is having right-sided pleurisy around his ribs in his mid axillary line. He denies any hemoptysis. He denies any fever or chills. He has been wheezing. He is getting short of breath with minimal exertion walking only 10 or 15 feet. His weight is actually down 2 pounds from his last office visit. He has no evidence of fluid overload on his exam today. He has trace pitting edema at both ankles. He denies orthopnea or paroxysmal nocturnal dyspnea. He denies angina. He denies chest pain. He is having some low back pain. He has a significant past medical history of coronary artery disease, COPD, and pulmonary fibrosis. His pulse oximetry was 90% on room air at arrival.  At that time, my plan was: 1. Dyspnea and respiratory abnormality Patient was given a Combivent neb times one in the office. His pulse oximetry improved to 92% on room air. His lungs sounded clearer with improved breath sounds throughout. - albuterol (PROVENTIL) (2.5 MG/3ML) 0.083% nebulizer solution 2.5 mg; Take 3 mL (2.5 mg total) by nebulization once. - ipratropium (ATROVENT) nebulizer solution 0.5 mg; Take 2.5 mL (0.5 mg total) by nebulization once. - DG Chest 2 View; Future  2. COPD exacerbation I will treat the patient empirically as a COPD exacerbation. I gave the patient 80 mg of Depo-Medrol today. He is to begin a Z-Pak and start albuterol 2 puffs every 6 hours inhaled as needed.  I will see the patient back tomorrow. He is to begin the prednisone 40 mg by mouth daily for 7 days starting tomorrow. Send the patient for a chest x-ray immediately.  Obviously if there are abnormalities on the chest x-ray the plan will change. - methylPREDNISolone acetate (DEPO-MEDROL) injection 80 mg;  Inject 1 mL (80 mg total) into the muscle once. - azithromycin (ZITHROMAX) 250 MG tablet; 2 tabs poqday 1, 1 tab poqday 2-5  Dispense: 6 tablet; Refill: 0 - predniSONE (DELTASONE) 20 MG tablet; Take 2 tablets (40 mg total) by mouth daily.  Dispense: 14 tablet; Refill: 0 - albuterol (PROVENTIL HFA;VENTOLIN HFA) 108 (90 BASE) MCG/ACT inhaler; Inhale 2 puffs into the lungs every 6 (six) hours as needed for wheezing.  Dispense: 1 Inhaler; Refill: 0  06/06/13 Patient is here today for a recheck.  The results of his chest x-ray are listed below: 1. New pleural opacity in the right mid and lower hemithorax  laterally. Cannot exclude pleural tumor. Recommend CT of the  chest with IV contrast media.  2. Chronic interstitial lung disease.  3. Stable cardiomegaly.  Patient has a CAT scan of his lung scheduled for August 20. However I do not feel that the pleural opacity in his right lung is the cause of his shortness of breath. Felt like he was likely having a COPD exacerbation versus shortness of breath due to his chronic pulmonary fibrosis.  Patient has received one dose of Depo-Medrol. He reports no change in his shortness of breath. His symptoms are not worse but they're not better. His pulse oximetry today is 90% on room air and unchanged. He continues to have shortness of breath with exertion. He denies any chest pain. He continues to have right-sided pleurisy. Past Medical History  Diagnosis Date  . Diabetes  mellitus   . Hypertension   . Hyperlipidemia   . Postinflammatory pulmonary fibrosis   . CAD (coronary artery disease)   . Pneumothorax on right 4/11  . Shortness of breath     with exertion   . Sleep apnea     does not use CPAP   . COPD (chronic obstructive pulmonary disease)    Past Surgical History  Procedure Laterality Date  . Coronary stent placement    . Elbow surgery    . Rotator cuff repair    . Bilateral vats ablation      rt  . Cardiac catheterization    . Right knee  surgery       2012   . Knee arthroscopy Right 01/22/2013    Procedure: RIGHT ARTHROSCOPY KNEE WITH DEBRIDEMENT, abrasion chondroplasty of lateral tibial plateau, debridement of partial tear of ACL, menisectomy;  Surgeon: Jacki Cones, MD;  Location: [facility];  Service: Orthopedics;  Laterality: Right;   Current Outpatient Prescriptions on File Prior to Visit  Medication Sig Dispense Refill  . albuterol (PROVENTIL HFA;VENTOLIN HFA) 108 (90 BASE) MCG/ACT inhaler Inhale 2 puffs into the lungs every 6 (six) hours as needed for wheezing.  1 Inhaler  0  . aspirin 81 MG tablet Take 81 mg by mouth daily.      Marland Kitchen atorvastatin (LIPITOR) 40 MG tablet Take 1 tablet by mouth  every night at bedtime  90 tablet  3  . azithromycin (ZITHROMAX) 250 MG tablet 2 tabs poqday 1, 1 tab poqday 2-5  6 tablet  0  . clopidogrel (PLAVIX) 75 MG tablet Take 1 tablet by mouth  every day  90 tablet  3  . furosemide (LASIX) 20 MG tablet Take 20 mg by mouth daily as needed (fluid).       Marland Kitchen glyBURIDE (DIABETA) 5 MG tablet Take 1 tablet by mouth two  times daily  180 tablet  3  . insulin aspart (NOVOLOG) 100 UNIT/ML injection 10 units with breakfast and lunch, 20 units With supper  3 vial  5  . Insulin Glargine (LANTUS SOLOSTAR) 100 UNIT/ML SOPN       . Insulin Pen Needle (PEN NEEDLES 31GX5/16") 31G X 8 MM MISC As directed  100 each  5  . isosorbide mononitrate (IMDUR) 60 MG 24 hr tablet Take 1 and 1/2 tablets by  mouth every day  135 tablet  3  . losartan (COZAAR) 100 MG tablet Take 1 tablet by mouth  every day  90 tablet  3  . metFORMIN (GLUCOPHAGE) 850 MG tablet Take 1 tablet by mouth two  times daily  180 tablet  3  . metoprolol (TOPROL-XL) 200 MG 24 hr tablet Take 1/2 tablet by mouth  every day  45 tablet  3  . niacin (NIASPAN) 1000 MG CR tablet Take 1 tablet by mouth  every day  90 tablet  3  . nitroGLYCERIN (NITROSTAT) 0.4 MG SL tablet Place 0.4 mg under the tongue every 5 (five) minutes as needed for chest pain.       Marland Kitchen  oxyCODONE-acetaminophen (PERCOCET) 10-325 MG per tablet Take 1 tablet by mouth every 4 (four) hours as needed for pain.  40 tablet  0  . predniSONE (DELTASONE) 20 MG tablet Take 2 tablets (40 mg total) by mouth daily.  14 tablet  0  . SYRINGE/NEEDLE, DISP, 1 ML (B-D SYRINGE/NEEDLE 1CC/25GX5/8) 25G X 5/8" 1 ML MISC As directed  100 each  5  . zolpidem (AMBIEN) 10  MG tablet TAKE 1 TABLET BY MOUTH AS NEEDED FOR SLEEP  30 tablet  1   No current facility-administered medications on file prior to visit.   No Known Allergies History   Social History  . Marital Status: Divorced    Spouse Name: N/A    Number of Children: 1  . Years of Education: N/A   Occupational History  .  [employer]   Social History Main Topics  . Smoking status: Former Smoker -- 1.50 packs/day for 35 years    Types: Cigarettes    Quit date: 10/24/2007  . Smokeless tobacco: Never Used  . Alcohol Use: Yes     Comment: social  . Drug Use: No  . Sexual Activity: Not on file   Other Topics Concern  . Not on file   Social History Narrative  . No narrative on file      Review of Systems  All other systems reviewed and are negative.       Objective:   Physical Exam  Vitals reviewed. Constitutional: He appears well-developed and well-nourished.  Neck: Neck supple. No JVD present. No thyromegaly present.  Cardiovascular: Normal rate, regular rhythm and normal heart sounds.   No murmur heard. Pulmonary/Chest: No accessory muscle usage. Not tachypneic. No respiratory distress. He has decreased breath sounds. He has no wheezes. He has no rhonchi. He has no rales.  Abdominal: Soft. Bowel sounds are normal. He exhibits no distension. There is no tenderness. There is no rebound and no guarding.  Musculoskeletal: He exhibits edema.  Lymphadenopathy:    He has no cervical adenopathy.   patient's breath sounds have improved slightly. There is no crackles or rales are appreciated on his exam.          Assessment & Plan:  1. SOB (shortness of breath) I still feel that the patient is either suffering from COPD versus pulmonary fibrosis. Continue steroids.  I gave the patient 80 mg IM of Depo-Medrol again today as his lung exam seems to have improved from yesterday after one dose of Depo-Medrol.  Begin prednisone taper pack starting tomorrow. Continue Z-Pak.  Use albuterol 2 puffs inhaled every 6 hours when necessary shortness of breath or wheezing. Go to the emergency room if worse. Recheck here on Monday.  Obtain a CBC to rule out anemia. - CBC with Differential - Basic Metabolic Panel - methylPREDNISolone acetate (DEPO-MEDROL) injection 80 mg; Inject 1 mL (80 mg total) into the muscle once.

## 2013-06-09 ENCOUNTER — Ambulatory Visit (INDEPENDENT_AMBULATORY_CARE_PROVIDER_SITE_OTHER): Payer: 59 | Admitting: Family Medicine

## 2013-06-09 ENCOUNTER — Encounter: Payer: Self-pay | Admitting: Family Medicine

## 2013-06-09 VITALS — BP 130/84 | HR 74 | Temp 97.6°F | Resp 24 | Wt 262.0 lb

## 2013-06-09 DIAGNOSIS — J948 Other specified pleural conditions: Secondary | ICD-10-CM

## 2013-06-09 DIAGNOSIS — R0600 Dyspnea, unspecified: Secondary | ICD-10-CM

## 2013-06-09 DIAGNOSIS — R0609 Other forms of dyspnea: Secondary | ICD-10-CM

## 2013-06-09 DIAGNOSIS — R222 Localized swelling, mass and lump, trunk: Secondary | ICD-10-CM

## 2013-06-09 DIAGNOSIS — J441 Chronic obstructive pulmonary disease with (acute) exacerbation: Secondary | ICD-10-CM

## 2013-06-09 MED ORDER — ZOLPIDEM TARTRATE 10 MG PO TABS: ORAL_TABLET | ORAL | Status: DC

## 2013-06-09 NOTE — Progress Notes (Signed)
Subjective:    Patient ID: Jerry Montgomery, male    DOB: December 11, 1955, 57 y.o.   MRN: [id number]  HPI 06/05/13 Patient reports to 3 days increasing shortness of breath and dyspnea on exertion. He is also having a cough productive of yellow and brown mucus. He is having right-sided pleurisy around his ribs in his mid axillary line. He denies any hemoptysis. He denies any fever or chills. He has been wheezing. He is getting short of breath with minimal exertion walking only 10 or 15 feet. His weight is actually down 2 pounds from his last office visit. He has no evidence of fluid overload on his exam today. He has trace pitting edema at both ankles. He denies orthopnea or paroxysmal nocturnal dyspnea. He denies angina. He denies chest pain. He is having some low back pain. He has a significant past medical history of coronary artery disease, COPD, and pulmonary fibrosis. His pulse oximetry was 90% on room air at arrival.  At that time, my plan was: 1. Dyspnea and respiratory abnormality Patient was given a Combivent neb times one in the office. His pulse oximetry improved to 92% on room air. His lungs sounded clearer with improved breath sounds throughout. - albuterol (PROVENTIL) (2.5 MG/3ML) 0.083% nebulizer solution 2.5 mg; Take 3 mL (2.5 mg total) by nebulization once. - ipratropium (ATROVENT) nebulizer solution 0.5 mg; Take 2.5 mL (0.5 mg total) by nebulization once. - DG Chest 2 View; Future  2. COPD exacerbation I will treat the patient empirically as a COPD exacerbation. I gave the patient 80 mg of Depo-Medrol today. He is to begin a Z-Pak and start albuterol 2 puffs every 6 hours inhaled as needed.  I will see the patient back tomorrow. He is to begin the prednisone 40 mg by mouth daily for 7 days starting tomorrow. Send the patient for a chest x-ray immediately.  Obviously if there are abnormalities on the chest x-ray the plan will change. - methylPREDNISolone acetate (DEPO-MEDROL) injection 80 mg;  Inject 1 mL (80 mg total) into the muscle once. - azithromycin (ZITHROMAX) 250 MG tablet; 2 tabs poqday 1, 1 tab poqday 2-5  Dispense: 6 tablet; Refill: 0 - predniSONE (DELTASONE) 20 MG tablet; Take 2 tablets (40 mg total) by mouth daily.  Dispense: 14 tablet; Refill: 0 - albuterol (PROVENTIL HFA;VENTOLIN HFA) 108 (90 BASE) MCG/ACT inhaler; Inhale 2 puffs into the lungs every 6 (six) hours as needed for wheezing.  Dispense: 1 Inhaler; Refill: 0  06/06/13 Patient is here today for a recheck.  The results of his chest x-ray are listed below: 1. New pleural opacity in the right mid and lower hemithorax  laterally. Cannot exclude pleural tumor. Recommend CT of the  chest with IV contrast media.  2. Chronic interstitial lung disease.  3. Stable cardiomegaly.  Patient has a CAT scan of his lung scheduled for August 20. However I do not feel that the pleural opacity in his right lung is the cause of his shortness of breath. Felt like he was likely having a COPD exacerbation versus shortness of breath due to his chronic pulmonary fibrosis.  Patient has received one dose of Depo-Medrol. He reports no change in his shortness of breath. His symptoms are not worse but they're not better. His pulse oximetry today is 90% on room air and unchanged. He continues to have shortness of breath with exertion. He denies any chest pain. He continues to have right-sided pleurisy.  At that time, my plan was: 1. SOB (  shortness of breath) I still feel that the patient is either suffering from COPD versus pulmonary fibrosis. Continue steroids.  I gave the patient 80 mg IM of Depo-Medrol again today as his lung exam seems to have improved from yesterday after one dose of Depo-Medrol.  Begin prednisone taper pack starting tomorrow. Continue Z-Pak.  Use albuterol 2 puffs inhaled every 6 hours when necessary shortness of breath or wheezing. Go to the emergency room if worse. Recheck here on Monday.  Obtain a CBC to rule out  anemia. - CBC with Differential - Basic Metabolic Panel - methylPREDNISolone acetate (DEPO-MEDROL) injection 80 mg; Inject 1 mL (80 mg total) into the muscle once.  06/09/13 He is here today for recheck.  Since starting the steroids, the patient's blood sugar has been ranging from 200-500. I initiated the patient on 4 times a day sliding scale NovoLog. The patient is to give himself 2 units of NovoLog for every 50 mg/dL increment in his blood sugar above 150.  This has generally kept his blood sugar in the range of 200-300.  He is also holding his metformin prior to the CT scan he has scheduled for Wednesday. This is to evaluate the right pleural opacity seen on his checks x-ray. Patient states he is approximately 50% better. He is now satting 94-96% on room air. With ambulation he drops to 90%. He is definitely doing better although he is not at his baseline. He has 4 more days of prednisone remaining. He has one more day on his Z-Pak.   Past Medical History  Diagnosis Date  . Diabetes mellitus   . Hypertension   . Hyperlipidemia   . Postinflammatory pulmonary fibrosis   . CAD (coronary artery disease)   . Pneumothorax on right 4/11  . Shortness of breath     with exertion   . Sleep apnea     does not use CPAP   . COPD (chronic obstructive pulmonary disease)    Past Surgical History  Procedure Laterality Date  . Coronary stent placement    . Elbow surgery    . Rotator cuff repair    . Bilateral vats ablation      rt  . Cardiac catheterization    . Right knee surgery       2012   . Knee arthroscopy Right 01/22/2013    Procedure: RIGHT ARTHROSCOPY KNEE WITH DEBRIDEMENT, abrasion chondroplasty of lateral tibial plateau, debridement of partial tear of ACL, menisectomy;  Surgeon: Jacki Cones, MD;  Location: [facility];  Service: Orthopedics;  Laterality: Right;   Current Outpatient Prescriptions on File Prior to Visit  Medication Sig Dispense Refill  . albuterol (PROVENTIL HFA;VENTOLIN  HFA) 108 (90 BASE) MCG/ACT inhaler Inhale 2 puffs into the lungs every 6 (six) hours as needed for wheezing.  1 Inhaler  0  . aspirin 81 MG tablet Take 81 mg by mouth daily.      Marland Kitchen atorvastatin (LIPITOR) 40 MG tablet Take 1 tablet by mouth  every night at bedtime  90 tablet  3  . azithromycin (ZITHROMAX) 250 MG tablet 2 tabs poqday 1, 1 tab poqday 2-5  6 tablet  0  . clopidogrel (PLAVIX) 75 MG tablet Take 1 tablet by mouth  every day  90 tablet  3  . furosemide (LASIX) 20 MG tablet Take 20 mg by mouth daily as needed (fluid).       Marland Kitchen glucose blood test strip Use as instructed  100 each  12  .  glyBURIDE (DIABETA) 5 MG tablet Take 1 tablet by mouth two  times daily  180 tablet  3  . insulin aspart (NOVOLOG) 100 UNIT/ML injection 10 units with breakfast and lunch, 20 units With supper  3 vial  5  . Insulin Glargine (LANTUS SOLOSTAR) 100 UNIT/ML SOPN       . Insulin Pen Needle (PEN NEEDLES 31GX5/16") 31G X 8 MM MISC As directed  100 each  5  . isosorbide mononitrate (IMDUR) 60 MG 24 hr tablet Take 1 and 1/2 tablets by  mouth every day  135 tablet  3  . Lancets (ACCU-CHEK MULTICLIX) lancets Use as instructed  100 each  12  . losartan (COZAAR) 100 MG tablet Take 1 tablet by mouth  every day  90 tablet  3  . metFORMIN (GLUCOPHAGE) 850 MG tablet Take 1 tablet by mouth two  times daily  180 tablet  3  . metoprolol (TOPROL-XL) 200 MG 24 hr tablet Take 1/2 tablet by mouth  every day  45 tablet  3  . niacin (NIASPAN) 1000 MG CR tablet Take 1 tablet by mouth  every day  90 tablet  3  . nitroGLYCERIN (NITROSTAT) 0.4 MG SL tablet Place 0.4 mg under the tongue every 5 (five) minutes as needed for chest pain.       Marland Kitchen oxyCODONE-acetaminophen (PERCOCET) 10-325 MG per tablet Take 1 tablet by mouth every 4 (four) hours as needed for pain.  40 tablet  0  . predniSONE (DELTASONE) 20 MG tablet Take 2 tablets (40 mg total) by mouth daily.  14 tablet  0  . SYRINGE/NEEDLE, DISP, 1 ML (B-D SYRINGE/NEEDLE 1CC/25GX5/8) 25G X  5/8" 1 ML MISC As directed  100 each  5  . zolpidem (AMBIEN) 10 MG tablet TAKE 1 TABLET BY MOUTH AS NEEDED FOR SLEEP  30 tablet  1   No current facility-administered medications on file prior to visit.   No Known Allergies History   Social History  . Marital Status: Divorced    Spouse Name: N/A    Number of Children: 1  . Years of Education: N/A   Occupational History  .  [employer]   Social History Main Topics  . Smoking status: Former Smoker -- 1.50 packs/day for 35 years    Types: Cigarettes    Quit date: 10/24/2007  . Smokeless tobacco: Never Used  . Alcohol Use: Yes     Comment: social  . Drug Use: No  . Sexual Activity: Not on file   Other Topics Concern  . Not on file   Social History Narrative  . No narrative on file      Review of Systems  All other systems reviewed and are negative.       Objective:   Physical Exam  Vitals reviewed. Constitutional: He appears well-developed and well-nourished.  Neck: Neck supple. No JVD present. No thyromegaly present.  Cardiovascular: Normal rate, regular rhythm and normal heart sounds.   No murmur heard. Pulmonary/Chest: No accessory muscle usage. Not tachypneic. No respiratory distress. He has decreased breath sounds. He has no wheezes. He has no rhonchi. He has no rales.  Abdominal: Soft. Bowel sounds are normal. He exhibits no distension. There is no tenderness. There is no rebound and no guarding.  Musculoskeletal: He exhibits edema.  Lymphadenopathy:    He has no cervical adenopathy.   patient's breath sounds have improved. There is no crackles or rales are appreciated on his exam.  Resting SpO2 94-96%, 90% on  ambulation.       Assessment & Plan:  1. Pleural mass  2. COPD exacerbation  3. Dyspnea and respiratory abnormality  Slowly improving. Complete prednisone 40 mg by mouth daily for a total seven-day pack. Use albuterol 2 puffs every 6 hours as needed. Finish Z-Pak. I will wait the results  of the CT scan on Wednesday. If his breathing continues to improve he does not need to follow up at this clinic COPD exacerbation. I will await the results of the CAT scan prior to determining a plan of care for the opacity seen in his right pleura.  Continue sliding scale NovoLog until he is off prednisone and has resumed his metformin.

## 2013-06-11 ENCOUNTER — Ambulatory Visit
Admission: RE | Admit: 2013-06-11 | Discharge: 2013-06-11 | Disposition: A | Discharge status: Home / Self Care | Payer: 59 | Source: Ambulatory Visit | Attending: Family Medicine | Admitting: Family Medicine

## 2013-06-11 DIAGNOSIS — J948 Other specified pleural conditions: Secondary | ICD-10-CM

## 2013-06-11 MED ORDER — IOHEXOL 300 MG/ML  SOLN
100.0000 mL | Freq: Once | INTRAMUSCULAR | Status: AC | PRN
  Administered 2013-06-11: 100 mL via INTRAVENOUS

## 2013-06-16 ENCOUNTER — Encounter: Payer: Self-pay | Admitting: Internal Medicine

## 2013-06-16 ENCOUNTER — Ambulatory Visit (INDEPENDENT_AMBULATORY_CARE_PROVIDER_SITE_OTHER): Payer: 59 | Admitting: Internal Medicine

## 2013-06-16 VITALS — BP 140/82 | HR 60 | Temp 97.1°F | Ht 73.0 in | Wt 265.6 lb

## 2013-06-16 DIAGNOSIS — J4489 Other specified chronic obstructive pulmonary disease: Secondary | ICD-10-CM

## 2013-06-16 DIAGNOSIS — R0789 Other chest pain: Secondary | ICD-10-CM

## 2013-06-16 DIAGNOSIS — R071 Chest pain on breathing: Secondary | ICD-10-CM

## 2013-06-16 DIAGNOSIS — J8410 Pulmonary fibrosis, unspecified: Secondary | ICD-10-CM

## 2013-06-16 DIAGNOSIS — J449 Chronic obstructive pulmonary disease, unspecified: Secondary | ICD-10-CM

## 2013-06-16 MED ORDER — PANTOPRAZOLE SODIUM 40 MG PO TBEC: 40.0000 mg | DELAYED_RELEASE_TABLET | Freq: Every day | ORAL | Status: DC

## 2013-06-16 MED ORDER — TRAMADOL HCL 50 MG PO TABS: ORAL_TABLET | ORAL | Status: DC

## 2013-06-16 MED ORDER — FAMOTIDINE 20 MG PO TABS: ORAL_TABLET | ORAL | Status: DC

## 2013-06-16 NOTE — Progress Notes (Signed)
Subjective:     Patient ID: Jerry Montgomery, male   DOB: August 12, 1956   MRN: [id number]   Primary = Jerry Montgomery   Brief patient profile:  59 yowm quit smoking Nov 2009 with some am cough resolved p quit and new R PTX 01/2010     HPI April 07, 2010 Followup with PFT's. s/p admit [facility] with PNA and recurrent pneumothorax. cc   breathing has been doing fine since then but not back to baseline assoc with coughing fits and frequent need for albuterol assoc with mild hoarseness and sensation of congested throat with noisy wheezing while sleeping per wife on chronic ACE per [name].  rec stop ramapril  start losartan 100mg  one daily in place of ramipril  use mucinex if needed for cough  use albuterol if needed for short of breath   June 08, 2010 main issue now is energy, not using any albuterol. cough resolved off ace1 rec stay off cig   July 2012 no limitations//no cough  Aug 2012 fell off ladder, tore R quad and very debilitated since   03/05/2012 Jerry Montgomery/ acute ov with acute  new sob x 2 weeks assoc with new cough > yellow / brown and gen ant chest discomfort worse with coughing.  No unusual exposures, no dental work or active sinus complains or epistaxis.  Some better on spiriva, using min otc's, no fever, non soaking  rec Augmentin 875 one twice daily with large glass of water and eat yogurt for lunch  For pain or cough take Tramadol 50 mg 1-2 every 4 hours Please schedule a follow up office visit in 6 weeks>  pfts   03/20/2012 f/u ov/Jerry Montgomery cc no better, not using more than 2-4 tramadol per day, mucus less brown, slt yellow now, generalized chest discomfort when coughing only.  No overt sinus or reflux symptoms, no daytime saba or variability rec For mucinex dm 1-2 every 12 hours and supplement with enough tramadol to knock out the cough and pain Prednisone 10 mg take  4 each am x 2 days,   2 each am x 2 days,  1 each am x2days and stop  Try prilosec 20mg   Take 30-60 min before first meal of the  day and Pepcid 20 mg one bedtime until cough is completely gone for at least a week without the need for cough suppression GERD diet.    04/24/2012 f/u ov/Jerry Montgomery cc pain much better, not limited by breathing.  No unusual cough, purulent sputum or sinus/hb symptoms on present rx. No longer needing more than 2 tramadol in 24 h for cp. rec No change rx   06/16/2013 f/u ov/Jerry Montgomery re PF Chief Complaint  Patient presents with  . Follow-up    Pt c/o increased SOB for the past month.  He also c/o right side pain progressively worse for the past year. He has occ prod cough with minimal yellow sputum. Pt reports had recent cxr and ct chest ordered by Dr Tanya Nones.    doe indolent progressive x one month to point limited walking x 50 ft,  rx prednisone x 3 d, ? Some better p prednisone Pain is under R breast over laterally size of hand and previously taking tramdol but ran out and worse since then, dates back to sev months post prev VATS in same location. Breathing ? Transiently better p saba vs helps to just sit and rest.  No obvious daytime variabilty or assoc   chest tightness, subjective wheeze overt sinus or hb symptoms.  No unusual exp hx or h/o childhood pna/ asthma or knowledge of premature birth.   Sleeping ok without nocturnal  or early am exacerbation  of respiratory  c/o's or need for noct saba. Also denies any obvious fluctuation of symptoms with weather or environmental changes or other aggravating or alleviating factors except as outlined above   Current Medications, Allergies, Complete Past Medical History, Past Surgical History, Family History, and Social History were reviewed in [employer] record.  ROS  The following are not active complaints unless bolded sore throat, dysphagia, dental problems, itching, sneezing,  nasal congestion or excess/ purulent secretions, ear ache,   fever, chills, sweats, unintended wt loss, pleuritic or exertional cp, hemoptysis,  orthopnea pnd  or leg swelling, presyncope, palpitations, heartburn, abdominal pain, anorexia, nausea, vomiting, diarrhea  or change in bowel or urinary habits, change in stools or urine, dysuria,hematuria,  rash, arthralgias, visual complaints, headache, numbness weakness or ataxia or problems with walking or coordination,  change in mood/affect or memory.        Past Medical History:  DIABETES MELLITUS, TYPE II (ICD-250.00)  COPD (ICD-496)  - PFT's April 07, 2010 FEV1 2.56 (68%) ratio 72 no better B2, DLC0 45 corrects to 80%, flat insp loop  - desat p 3 laps June 08, 2010  HYPERTENSION (ICD-401.9)  - Try off ace April 07, 2010 due to pseudowheeze >  HYPERLIPIDEMIA (ICD-272.4)  PULMONARY FIBROSIS (ICD-515)  CAD (ICD-414.00)......................................Marland KitchenNicholaus Bloom  - LHC 02/14/10 mild lv dysfunction > mutilvessel coronary artery disease with new stent cx  R PTX 02/11/10  - CT chest 423/11 Upper lobe predominant subpleural honeycombing with patch bilateral gg densities and rul bulla  - Mar 16 2010 Right video-assisted thoracoscopic surgery,  minithoracotomy, resection of apical bullae, pleurectomy, and  pleurodesis > 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             Objective:   Physical Exam wt 259 Feb 23, 2010 > 248 April 07, 2010 >   258 03/20/2012 > 04/24/2012 254 > 265 06/16/2013  amb wm nad  HEENT mild turbinate edema. Oropharynx no thrush or excess pnd or cobblestoning. No JVD or cervical adenopathy. Mild accessory muscle hypertrophy. Trachea midline, nl thryroid. Chest was hyperinflated by percussion with diminished breath sounds and moderate increased exp time without wheeze. Hoover sign positive at mid inspiration. Regular rate and rhythm without murmur gallop or rub or increase P2 or edema. Abd: no hsm, nl excursion. Ext warm without cyanosis or clubbing     Lab 03/20/12 nl cbc, nl esr  Ct chest 06/11/13 1. Chronic interstitial lung disease. Findings include traction   bronchiectasis, ground-glass attenuation, interstitial reticulation  and micro honeycombing. Findings are concerning for usual  interstitial pneumonitis.  2. Similar appearance of calcified and noncalcified mediastinal  and hilar lymph nodes which likely reflect prior granulomatous  disease.        Assessment:

## 2013-06-16 NOTE — Patient Instructions (Addendum)
Pantoprazole (protonix) 40 mg   Take 30-60 min before first meal of the day and Pepcid 20 mg one bedtime until return to office - this is the best way to tell whether stomach acid is contributing to your problem.    Zostrix cream apply 4 x daily until pain is better and then taper to just at bedtime  (chest wall pain is most neuralgia)   In meantime, if pain bad, take tramadol 50 mg one every 4 hours if needed   GERD (REFLUX)  is an extremely common cause of respiratory symptoms, many times with no significant heartburn at all.    It can be treated with medication, but also with lifestyle changes including avoidance of late meals, excessive alcohol, smoking cessation, and avoid fatty foods, chocolate, peppermint, colas, red wine, and acidic juices such as orange juice.  NO MINT OR MENTHOL PRODUCTS SO NO COUGH DROPS  USE SUGARLESS CANDY INSTEAD (jolley ranchers or Stover's)  NO OIL BASED VITAMINS - use powdered substitutes.    Please schedule a follow up office visit in 4 weeks, sooner if needed with pfts

## 2013-06-17 ENCOUNTER — Telehealth: Payer: Self-pay | Admitting: Family Medicine

## 2013-06-17 DIAGNOSIS — R0789 Other chest pain: Secondary | ICD-10-CM | POA: Insufficient documentation

## 2013-06-17 DIAGNOSIS — J8410 Pulmonary fibrosis, unspecified: Secondary | ICD-10-CM

## 2013-06-17 NOTE — Assessment & Plan Note (Signed)
Strongly suspect post vats chest wall pain, neuralgia like > try zostrix and prn tramadol

## 2013-06-17 NOTE — Assessment & Plan Note (Addendum)
Barely met criteria for copd previously sp smoking cessation in 2009  > rec repeat pfts now and just use saba prn in meantime

## 2013-06-17 NOTE — Telephone Encounter (Signed)
Not happy with Dr Sherene Sires.  Not happy with his care.  Does not feel Dr Sherene Sires is listening to them or answering any of their concerns.  Patient still very short of breath and feel nothing is being done.  Dr Sherene Sires seemed to think all fine and see you again in a month.  They left visit feeling very disappointed.

## 2013-06-17 NOTE — Assessment & Plan Note (Signed)
Followed in Pulmonary clinic/ [facility]/ [place]     - CT chest 02/12/10 1.Examination of the fourth chronic interstitial lung disease.  This consist mainly of upper lobe predominant subpleural  honeycombing  Patchy bilateral ground-glass densities and peribronchovascular  consolidation and nodularity. There are also calcified mediastinal  and hilar lymph nodes. 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 Differential considerations and occluded silicosis prior  granulomatous infection or hypersensitivity pneumonitis.  2. 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 Chronic interstitial lung disease. Findings include traction  bronchiectasis, ground-glass attenuation, interstitial reticulation  and micro honeycombing. Findings are concerning for usual  interstitial pneumonitis.  2. Similar appearance of calcified and noncalcified mediastinal  and hilar lymph nodes which likely reflect prior granulomatous  disease. - 06/16/2013  Walked RA x 3 laps @ 185 ft each stopped due to  No desat , end of study   Not convince there is a progressing fibrotic illness here based on previous open lung bx and no progression x one year rec repeat pfts

## 2013-06-19 ENCOUNTER — Other Ambulatory Visit: Payer: Self-pay | Admitting: Family Medicine

## 2013-06-19 DIAGNOSIS — J8489 Other specified interstitial pulmonary diseases: Secondary | ICD-10-CM

## 2013-06-19 NOTE — Telephone Encounter (Signed)
.  Patient's dtr aware

## 2013-06-19 NOTE — Telephone Encounter (Signed)
Dr. Sherene Sires has suggested a second opinion at [facility] or [place].  Can we please arrange.

## 2013-07-08 ENCOUNTER — Other Ambulatory Visit: Payer: Self-pay | Admitting: Family Medicine

## 2013-07-08 DIAGNOSIS — J439 Emphysema, unspecified: Secondary | ICD-10-CM | POA: Insufficient documentation

## 2013-07-08 DIAGNOSIS — G4733 Obstructive sleep apnea (adult) (pediatric): Secondary | ICD-10-CM | POA: Insufficient documentation

## 2013-07-08 DIAGNOSIS — J849 Interstitial pulmonary disease, unspecified: Secondary | ICD-10-CM | POA: Insufficient documentation

## 2013-07-08 DIAGNOSIS — R0600 Dyspnea, unspecified: Secondary | ICD-10-CM | POA: Insufficient documentation

## 2013-07-08 NOTE — Telephone Encounter (Signed)
Meds refilled.

## 2013-07-21 ENCOUNTER — Ambulatory Visit: Payer: 59 | Admitting: Internal Medicine

## 2013-08-06 ENCOUNTER — Encounter: Payer: Self-pay | Admitting: *Deleted

## 2013-08-06 DIAGNOSIS — Z23 Encounter for immunization: Secondary | ICD-10-CM | POA: Insufficient documentation

## 2013-08-06 DIAGNOSIS — R0902 Hypoxemia: Secondary | ICD-10-CM | POA: Insufficient documentation

## 2013-08-08 ENCOUNTER — Encounter: Payer: Self-pay | Admitting: Cardiovascular Disease

## 2013-08-08 ENCOUNTER — Ambulatory Visit (INDEPENDENT_AMBULATORY_CARE_PROVIDER_SITE_OTHER): Payer: 59 | Admitting: Cardiovascular Disease

## 2013-08-08 VITALS — BP 120/86 | HR 61 | Ht 73.0 in | Wt 265.7 lb

## 2013-08-08 DIAGNOSIS — I2510 Atherosclerotic heart disease of native coronary artery without angina pectoris: Secondary | ICD-10-CM

## 2013-08-08 DIAGNOSIS — E785 Hyperlipidemia, unspecified: Secondary | ICD-10-CM

## 2013-08-08 DIAGNOSIS — E119 Type 2 diabetes mellitus without complications: Secondary | ICD-10-CM

## 2013-08-08 DIAGNOSIS — J449 Chronic obstructive pulmonary disease, unspecified: Secondary | ICD-10-CM

## 2013-08-08 DIAGNOSIS — J4489 Other specified chronic obstructive pulmonary disease: Secondary | ICD-10-CM

## 2013-08-08 DIAGNOSIS — I10 Essential (primary) hypertension: Secondary | ICD-10-CM

## 2013-08-08 NOTE — Patient Instructions (Signed)
Your physician recommends that you schedule a follow-up appointment in: 1 YEAR. No changes were made today. 

## 2013-08-10 ENCOUNTER — Encounter: Payer: Self-pay | Admitting: Cardiovascular Disease

## 2013-08-10 NOTE — Progress Notes (Signed)
Patient ID: Jerry Montgomery, male   DOB: 02-05-1956, 57 y.o.   MRN: [id number]     HPI: Jerry Montgomery is a 57 y.o. Jerry Montgomery presents for one year cardiology evaluation. In 2012 he ruptured his quadriceps tendon and required surgery by Dr. Albin Felling for he tolerated that well from a cardiovascular standpoint.  Mr. Sessler has history of known coronary artery disease in May 2004 underwent stenting of his proximal LAD and proximal circumflex coronary arteries. In July 2008 he developed a new 85% stenosis of the mid circumflex vessel at which time a 3.5x13 mm Cypher stent was inserted. His 2 previously placed stents remain patent. At his last catheterization in March 2011 he was found to have 95% stenosis between the tube previously placed stents in the circumflex and a new 3.0x20 mm post and was inserted to cover the mid area and also cover the proximal region. His LAD stent was patent with 40% proximal and mid stenosis. Obtuse marginal 1 vessel at 60-70% narrowing the distal circumflex had 30% narrowing and a nondominant right coronary artery had 80% stenosis. In 2012, he ruptured his quadriceps tendon and underwent surgery by Dr. Darrelyn Hillock and tolerated this well from a cardiac standpoint. He does have a remote history of tobacco use. Has a history of hyperlipidemia, hypertension, type 2 diabetes mellitus, as well as emphysematous blebs. In May 2001 he underwent resection of apical bullae pleurectomy and pleurocentesis. He had been followed by Dr. Sherene Sires for pulmonary care and now some care of Dr. Harlon Ditty at [facility]. Apparently he was started on oxygen. He denies any anginal symptoms. Is unaware of tachycardia dysrhythmias. He presents for one-year cardiology evaluation.  Past Medical History  Diagnosis Date  . Diabetes mellitus   . Hypertension   . Hyperlipidemia   . Postinflammatory pulmonary fibrosis   . CAD (coronary artery disease)   . Pneumothorax on right 4/11  . Shortness of breath    with exertion   . Sleep apnea     does not use CPAP   . COPD (chronic obstructive pulmonary disease)     Past Surgical History  Procedure Laterality Date  . Coronary stent placement  03/05/2003    PCI & Stent  LAD & mid CX  . Elbow surgery    . Rotator cuff repair    . Bilateral vats ablation      rt  . Cardiac catheterization  05/03/2007    patent stents  . Right knee surgery       2012   . Knee arthroscopy Right 01/22/2013    Procedure: RIGHT ARTHROSCOPY KNEE WITH DEBRIDEMENT, abrasion chondroplasty of lateral tibial plateau, debridement of partial tear of ACL, menisectomy;  Surgeon: Jacki Cones, MD;  Location: [facility];  Service: Orthopedics;  Laterality: Right;  . Coronary angioplasty with stent placement  01/13/2010    PCI & stent to CX    Allergies  Allergen Reactions  . Altace [Ramipril]     Cough     Current Outpatient Prescriptions  Medication Sig Dispense Refill  . ADVAIR HFA 115-21 MCG/ACT inhaler Inhale 2 puffs into the lungs 2 (two) times daily.      Marland Kitchen albuterol (PROVENTIL HFA;VENTOLIN HFA) 108 (90 BASE) MCG/ACT inhaler Inhale 2 puffs into the lungs every 6 (six) hours as needed for wheezing.  1 Inhaler  0  . aspirin 81 MG tablet Take 81 mg by mouth daily.      Marland Kitchen atorvastatin (LIPITOR) 40 MG tablet Take 1  tablet by mouth  every night at bedtime  90 tablet  3  . clopidogrel (PLAVIX) 75 MG tablet Take 1 tablet by mouth  every day  90 tablet  3  . furosemide (LASIX) 20 MG tablet Take 20 mg by mouth daily as needed (fluid).       Marland Kitchen glucose blood test strip Use as instructed  100 each  12  . glyBURIDE (DIABETA) 5 MG tablet Take 1 tablet by mouth two  times daily  180 tablet  3  . insulin aspart (NOVOLOG) 100 UNIT/ML injection 10 units with breakfast and lunch, 20 units With supper  3 vial  5  . Insulin Glargine (LANTUS SOLOSTAR) 100 UNIT/ML SOPN       . Insulin Pen Needle (PEN NEEDLES 31GX5/16") 31G X 8 MM MISC As directed  100 each  5  . isosorbide mononitrate  (IMDUR) 60 MG 24 hr tablet 1 tablet daily      . Lancets (ACCU-CHEK MULTICLIX) lancets Use as instructed  100 each  12  . LANTUS SOLOSTAR 100 UNIT/ML SOPN Inject subcutaneously 65  units every night at  bedtime  2 pen  1  . lidocaine (LIDODERM) 5 % Place 1 patch onto the skin daily.      Marland Kitchen losartan (COZAAR) 100 MG tablet Take 1 tablet by mouth  every day  90 tablet  3  . metFORMIN (GLUCOPHAGE) 850 MG tablet Take 1 tablet by mouth two  times daily  180 tablet  3  . metoprolol (TOPROL-XL) 200 MG 24 hr tablet Take 1/2 tablet by mouth  every day  45 tablet  3  . niacin (NIASPAN) 1000 MG CR tablet Take 1 tablet by mouth  every day  90 tablet  3  . nitroGLYCERIN (NITROSTAT) 0.4 MG SL tablet Place 0.4 mg under the tongue every 5 (five) minutes as needed for chest pain.       Marland Kitchen SPIRIVA HANDIHALER 18 MCG inhalation capsule Place 1 capsule into inhaler and inhale daily.      . SYRINGE/NEEDLE, DISP, 1 ML (B-D SYRINGE/NEEDLE 1CC/25GX5/8) 25G X 5/8" 1 ML MISC As directed  100 each  5  . traMADol (ULTRAM) 50 MG tablet 1-2 every 4 hours as needed for cough or pain  40 tablet  0  . zolpidem (AMBIEN) 10 MG tablet TAKE 1 TABLET BY MOUTH AS NEEDED FOR SLEEP  30 tablet  1   No current facility-administered medications for this visit.    History   Social History  . Marital Status: Divorced    Spouse Name: N/A    Number of Children: 1  . Years of Education: N/A   Occupational History  .  [employer]   Social History Main Topics  . Smoking status: Former Smoker -- 1.50 packs/day for 35 years    Types: Cigarettes    Quit date: 10/24/2007  . Smokeless tobacco: Never Used  . Alcohol Use: Yes     Comment: social  . Drug Use: No  . Sexual Activity: Not on file   Other Topics Concern  . Not on file   Social History Narrative  . No narrative on file    Family History  Problem Relation Age of Onset  . Breast cancer Mother   . Lung cancer Father   . Diabetes type II Mother    Socially he has  one child. There is a remote tobacco history quit in 2009. He is not routinely exercise.   ROS is negative  for fevers, chills or night sweats.  He does have mild shortness of breath. He denies palpitations. He denies present presyncope. He now is on supplemental oxygen therapy. There is no anginal symptoms. He denies bleeding. He denies change in bowel or bladder habits. There is no claudication. He denies tremors. He denies rash. He denies paresthesias. He does have GERD he is diabetic. There is hyperlipidemic history for which she is on Niaspan as well as atorvastatin for combination therapy.  His history of hypertension and states this has been well-controlled. A chest CT in August 2014 date demonstrated bronchiectasis, groundglass attenuation, interstitial reticulation and micro honeycombing. Other comprehensive 12 point system review is negative.  PE BP 120/86  Pulse 61  Ht 6\' 1"  (1.854 m)  Wt 265 lb 11.2 oz (120.521 kg)  BMI 35.06 kg/m2  General: Alert, oriented, no distress.  Skin: normal turgor, no rashes HEENT: Normocephalic, atraumatic. Pupils round and reactive; sclera anicteric;no lid lag.  Nose without nasal septal hypertrophy Mouth/Parynx benign; Mallinpatti scale 3 Neck: No JVD, no carotid briuts Lungs: Slightly decreased breath sounds.; no wheezing or rales Heart: RRR, s1 s2 normal 1/6 systolic murmur Abdomen: soft, nontender; no hepatosplenomehaly, BS+; abdominal aorta nontender and not dilated by palpation. Pulses 2+ Extremities: Bilateral knee scars. no clubbing cyanosis or edema, Homan's sign negative  Neurologic: grossly nonfocal Psychologic: normal affect and mood.  ECG: Sinus rhythm with left axis deviation. Incomplete right bundle branch block. Inferior posterior infarction changes.  LABS:  BMET    Component Value Date/Time   NA 135 06/06/2013 1030   K 4.7 06/06/2013 1030   CL 101 06/06/2013 1030   CO2 24 06/06/2013 1030   GLUCOSE 542* 06/06/2013 1030   BUN 23  06/06/2013 1030   CREATININE 1.33 06/06/2013 1030   CREATININE 1.09 01/20/2013 1420   CALCIUM 9.9 06/06/2013 1030   GFRNONAA 72* 01/20/2013 1420   GFRAA 84* 01/20/2013 1420     Hepatic Function Panel     Component Value Date/Time   PROT 6.8 02/19/2013 0950   ALBUMIN 4.3 02/19/2013 0950   AST 20 02/19/2013 0950   ALT 24 02/19/2013 0950   ALKPHOS 52 02/19/2013 0950   BILITOT 1.0 02/19/2013 0950     CBC    Component Value Date/Time   WBC 9.5 06/06/2013 1030   RBC 4.54 06/06/2013 1030   HGB 14.4 06/06/2013 1030   HCT 41.4 06/06/2013 1030   PLT 174 06/06/2013 1030   MCV 91.2 06/06/2013 1030   MCH 31.7 06/06/2013 1030   MCHC 34.8 06/06/2013 1030   RDW 13.5 06/06/2013 1030   LYMPHSABS 1.0 06/06/2013 1030   MONOABS 1.0 06/06/2013 1030   EOSABS 0.0 06/06/2013 1030   BASOSABS 0.0 06/06/2013 1030     BNP    Component Value Date/Time   PROBNP 286.0* 03/21/2010 0350    Lipid Panel     Component Value Date/Time   CHOL 122 02/19/2013 0950   TRIG 186* 02/19/2013 0950   HDL 33* 02/19/2013 0950   CHOLHDL 3.7 02/19/2013 0950   VLDL 37 02/19/2013 0950   LDLCALC 52 02/19/2013 0950     RADIOLOGY: No results found.    ASSESSMENT AND PLAN:  Mr. Schippers is a 57 year old gentleman with established coronary artery disease and is status post intervention to his LAD and circumflex vessel initially 10 years ago. He's had subsequent interventions in 2008 and most recently in 2011 as noted above. He did undergo a nuclear perfusion study in February 2014 which suggested  normal perfusion without significant scar or ischemia. The study was not gated secondary to ectopy. Presently, he is not having anginal symptoms. He now is seeing a pulmonologist at [facility] and has been started on oxygen supplementation for his brochiectasis. 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 Presently, his blood pressure is well controlled. His LDL cholesterol is 52 on current therapy although  triglycerides are elevated and HDL is low suggestive of atherogenic dyslipidemia pattern. I will see him in one year for followup evaluation or sooner if problems arise.     Lennette Bihari, MD, [facility]  08/10/2013 9:53 PM

## 2013-08-12 ENCOUNTER — Encounter: Payer: Self-pay | Admitting: Cardiovascular Disease

## 2013-08-12 DIAGNOSIS — Z0271 Encounter for disability determination: Secondary | ICD-10-CM

## 2013-08-21 ENCOUNTER — Telehealth: Payer: Self-pay | Admitting: Family Medicine

## 2013-08-21 MED ORDER — ZOLPIDEM TARTRATE 10 MG PO TABS: ORAL_TABLET | ORAL | Status: DC

## 2013-08-21 NOTE — Telephone Encounter (Signed)
Needs refill on Ambien  

## 2013-08-21 NOTE — Telephone Encounter (Signed)
?   OK to Refill  

## 2013-08-21 NOTE — Telephone Encounter (Signed)
ok 

## 2013-08-21 NOTE — Telephone Encounter (Signed)
rx was printed and faxed to pharmacy 

## 2013-08-22 ENCOUNTER — Other Ambulatory Visit: Payer: Self-pay | Admitting: Family Medicine

## 2013-08-22 MED ORDER — ZOLPIDEM TARTRATE 10 MG PO TABS: ORAL_TABLET | ORAL | Status: DC

## 2013-08-22 NOTE — Telephone Encounter (Signed)
Patients sleep med Ambien sent to wrong pharmacy.  Correct pharmacy called.

## 2013-08-28 ENCOUNTER — Other Ambulatory Visit: Payer: Self-pay

## 2013-10-17 ENCOUNTER — Other Ambulatory Visit: Payer: Self-pay | Admitting: Family Medicine

## 2013-10-17 ENCOUNTER — Encounter: Payer: Self-pay | Admitting: Family Medicine

## 2013-10-17 NOTE — Telephone Encounter (Signed)
Medication refilled per protocol.Patient needs to be seen before any further refills Letter sent

## 2013-11-03 ENCOUNTER — Encounter: Payer: Self-pay | Admitting: Family Medicine

## 2013-11-03 ENCOUNTER — Ambulatory Visit (INDEPENDENT_AMBULATORY_CARE_PROVIDER_SITE_OTHER): Payer: 59 | Admitting: Family Medicine

## 2013-11-03 VITALS — BP 124/80 | HR 60 | Temp 97.2°F | Resp 18 | Wt 272.0 lb

## 2013-11-03 DIAGNOSIS — Z79899 Other long term (current) drug therapy: Secondary | ICD-10-CM

## 2013-11-03 DIAGNOSIS — E039 Hypothyroidism, unspecified: Secondary | ICD-10-CM

## 2013-11-03 DIAGNOSIS — E785 Hyperlipidemia, unspecified: Secondary | ICD-10-CM

## 2013-11-03 DIAGNOSIS — E119 Type 2 diabetes mellitus without complications: Secondary | ICD-10-CM

## 2013-11-03 DIAGNOSIS — I2510 Atherosclerotic heart disease of native coronary artery without angina pectoris: Secondary | ICD-10-CM

## 2013-11-03 DIAGNOSIS — I10 Essential (primary) hypertension: Secondary | ICD-10-CM

## 2013-11-03 DIAGNOSIS — Z125 Encounter for screening for malignant neoplasm of prostate: Secondary | ICD-10-CM

## 2013-11-03 LAB — CBC WITH DIFFERENTIAL/PLATELET
BASOS PCT: 1 % (ref 0–1)
Basophils Absolute: 0 10*3/uL (ref 0.0–0.1)
EOS PCT: 3 % (ref 0–5)
Eosinophils Absolute: 0.2 10*3/uL (ref 0.0–0.7)
HEMATOCRIT: 42.2 % (ref 39.0–52.0)
Hemoglobin: 14.3 g/dL (ref 13.0–17.0)
Lymphocytes Relative: 25 % (ref 12–46)
Lymphs Abs: 1.6 10*3/uL (ref 0.7–4.0)
MCH: 30.8 pg (ref 26.0–34.0)
MCHC: 33.9 g/dL (ref 30.0–36.0)
MCV: 90.9 fL (ref 78.0–100.0)
MONO ABS: 0.7 10*3/uL (ref 0.1–1.0)
MONOS PCT: 10 % (ref 3–12)
NEUTROS ABS: 3.9 10*3/uL (ref 1.7–7.7)
Neutrophils Relative %: 61 % (ref 43–77)
Platelets: 174 10*3/uL (ref 150–400)
RBC: 4.64 MIL/uL (ref 4.22–5.81)
RDW: 14.7 % (ref 11.5–15.5)
WBC: 6.4 10*3/uL (ref 4.0–10.5)

## 2013-11-03 LAB — LIPID PANEL
CHOLESTEROL: 127 mg/dL (ref 0–200)
HDL: 37 mg/dL — AB (ref >39)
LDL CALC: 59 mg/dL (ref 0–99)
Total CHOL/HDL Ratio: 3.4 Ratio
Triglycerides: 153 mg/dL — ABNORMAL HIGH (ref <150)
VLDL: 31 mg/dL (ref 0–40)

## 2013-11-03 LAB — TSH: TSH: 8.069 u[IU]/mL — ABNORMAL HIGH (ref 0.350–4.500)

## 2013-11-03 LAB — HEMOGLOBIN A1C
Hgb A1c MFr Bld: 8.4 % — ABNORMAL HIGH (ref <5.7)
Mean Plasma Glucose: 194 mg/dL — ABNORMAL HIGH (ref <117)

## 2013-11-03 LAB — PSA: PSA: 0.18 ng/mL (ref <=4.00)

## 2013-11-03 NOTE — Progress Notes (Signed)
Subjective:    Patient ID: Jerry Montgomery, male    DOB: 1955-12-28, 58 y.o.   MRN: [id number]  HPI Patient has recently seen pulmonology at [facility]. For his pulmonary fibrosis that had started him on Esbriet.  He will need liver function test checked monthly on medication for that for 6 months and then every 3 months thereafter. At the present time he has not been taking the medication long enough to see if there is any improvement in his condition. He is here today to discuss his diabetes, hypertension, and hyperlipidemia. He had fasting lab work drawn this morning although the results are back for me to review. He is checking his sugars approximately once every other day. Typically his sugars range between 120 and 200. He denies any hypoglycemia. He denies any polyuria, polydipsia, blurred vision. He denies any chest pain. His shortness of breath and dyspnea on exertion are stable and chronic. He would be a good candidate for Prevnar 13. Past Medical History  Diagnosis Date  . Diabetes mellitus   . Hypertension   . Hyperlipidemia   . Postinflammatory pulmonary fibrosis   . CAD (coronary artery disease)   . Pneumothorax on right 4/11  . Shortness of breath     with exertion   . Sleep apnea     does not use CPAP   . COPD (chronic obstructive pulmonary disease)    Current Outpatient Prescriptions on File Prior to Visit  Medication Sig Dispense Refill  . ADVAIR HFA 115-21 MCG/ACT inhaler Inhale 2 puffs into the lungs 2 (two) times daily.      Marland Kitchen albuterol (PROVENTIL HFA;VENTOLIN HFA) 108 (90 BASE) MCG/ACT inhaler Inhale 2 puffs into the lungs every 6 (six) hours as needed for wheezing.  1 Inhaler  0  . aspirin 81 MG tablet Take 81 mg by mouth daily.      Marland Kitchen atorvastatin (LIPITOR) 40 MG tablet Take 1 tablet by mouth  every night at bedtime  90 tablet  3  . clopidogrel (PLAVIX) 75 MG tablet Take 1 tablet by mouth  every day  90 tablet  3  . glucose blood test strip Use as instructed  100 each   12  . glyBURIDE (DIABETA) 5 MG tablet Take 1 tablet by mouth two  times daily  180 tablet  3  . insulin aspart (NOVOLOG) 100 UNIT/ML injection 10 units with breakfast and lunch, 20 units With supper  3 vial  5  . Insulin Pen Needle (PEN NEEDLES 31GX5/16") 31G X 8 MM MISC As directed  100 each  5  . isosorbide mononitrate (IMDUR) 60 MG 24 hr tablet 1 tablet daily      . Lancets (ACCU-CHEK MULTICLIX) lancets Use as instructed  100 each  12  . LANTUS SOLOSTAR 100 UNIT/ML SOPN Inject subcutaneously 65   units every night at   bedtime  60 pen  0  . losartan (COZAAR) 100 MG tablet Take 1 tablet by mouth  every day  90 tablet  3  . metFORMIN (GLUCOPHAGE) 850 MG tablet Take 1 tablet by mouth two  times daily  180 tablet  3  . niacin (NIASPAN) 1000 MG CR tablet Take 1 tablet by mouth  every day  90 tablet  3  . nitroGLYCERIN (NITROSTAT) 0.4 MG SL tablet Place 0.4 mg under the tongue every 5 (five) minutes as needed for chest pain.       Marland Kitchen SPIRIVA HANDIHALER 18 MCG inhalation capsule Place 1 capsule into  inhaler and inhale daily.      . SYRINGE/NEEDLE, DISP, 1 ML (B-D SYRINGE/NEEDLE 1CC/25GX5/8) 25G X 5/8" 1 ML MISC As directed  100 each  5  . zolpidem (AMBIEN) 10 MG tablet TAKE 1 TABLET BY MOUTH AS NEEDED FOR SLEEP  30 tablet  2  . furosemide (LASIX) 20 MG tablet Take 20 mg by mouth daily as needed (fluid).       Marland Kitchen. lidocaine (LIDODERM) 5 % Place 1 patch onto the skin daily.      . traMADol (ULTRAM) 50 MG tablet 1-2 every 4 hours as needed for cough or pain  40 tablet  0   No current facility-administered medications on file prior to visit.   Past Surgical History  Procedure Laterality Date  . Coronary stent placement  03/05/2003    PCI & Stent  LAD & mid CX  . Elbow surgery    . Rotator cuff repair    . Bilateral vats ablation      rt  . Cardiac catheterization  05/03/2007    patent stents  . Right knee surgery       2012   . Knee arthroscopy Right 01/22/2013    Procedure: RIGHT ARTHROSCOPY KNEE  WITH DEBRIDEMENT, abrasion chondroplasty of lateral tibial plateau, debridement of partial tear of ACL, menisectomy;  Surgeon: Jacki Conesonald A Gioffre, MD;  Location: [facility];  Service: Orthopedics;  Laterality: Right;  . Coronary angioplasty with stent placement  01/13/2010    PCI & stent to CX   Allergies  Allergen Reactions  . Altace [Ramipril]     Cough    History   Social History  . Marital Status: Divorced    Spouse Name: N/A    Number of Children: 1  . Years of Education: N/A   Occupational History  .  [employer]   Social History Main Topics  . Smoking status: Former Smoker -- 1.50 packs/day for 35 years    Types: Cigarettes    Quit date: 10/24/2007  . Smokeless tobacco: Never Used  . Alcohol Use: Yes     Comment: social  . Drug Use: No  . Sexual Activity: Not on file   Other Topics Concern  . Not on file   Social History Narrative  . No narrative on file    Review of Systems  All other systems reviewed and are negative.       Objective:   Physical Exam  Vitals reviewed. Neck: Neck supple. No JVD present. No thyromegaly present.  Cardiovascular: Normal rate, regular rhythm and normal heart sounds.  Exam reveals no gallop and no friction rub.   No murmur heard. Pulmonary/Chest: Effort normal and breath sounds normal. No respiratory distress. He has no wheezes. He has no rales. He exhibits no tenderness.  Abdominal: Soft. Bowel sounds are normal. He exhibits no distension and no mass. There is no tenderness. There is no rebound and no guarding.  Musculoskeletal: He exhibits no edema.  Lymphadenopathy:    He has no cervical adenopathy.   breath sounds are diminished bilaterally        Assessment & Plan:  HLD (hyperlipidemia) - Plan: COMPLETE METABOLIC PANEL WITH GFR, Lipid Panel, CBC with Differential, Hemoglobin A1c, PSA, TSH, Vitamin D, 25-hydroxy  HTN (hypertension) - Plan: COMPLETE METABOLIC PANEL WITH GFR, Lipid Panel, CBC with Differential,  Hemoglobin A1c, PSA, TSH, Vitamin D, 25-hydroxy  CAD (coronary artery disease) - Plan: COMPLETE METABOLIC PANEL WITH GFR, Lipid Panel, CBC with Differential, Hemoglobin A1c, PSA,  TSH, Vitamin D, 25-hydroxy  Encounter for long-term (current) use of other medications - Plan: COMPLETE METABOLIC PANEL WITH GFR, Lipid Panel, CBC with Differential, Hemoglobin A1c, PSA, TSH, Vitamin D, 25-hydroxy  Screening PSA (prostate specific antigen) - Plan: COMPLETE METABOLIC PANEL WITH GFR, Lipid Panel, CBC with Differential, Hemoglobin A1c, PSA, TSH, Vitamin D, 25-hydroxy  DM (diabetes mellitus) - Plan: COMPLETE METABOLIC PANEL WITH GFR, Lipid Panel, CBC with Differential, Hemoglobin A1c, PSA, TSH, Vitamin D, 25-hydroxy   I will check the patient's hemoglobin A1c. His goal hemoglobin A1c is less than 7.0. His blood pressure is currently well controlled. Continue current medications at the present dosages. I will also check a fasting lipid panel. His goal LDL is less than 100.  I recommended Prevnar 13. The patient will check on the price and then return to vaccination if he so desires. Recheck a CMP monthly while on esbriet.

## 2013-11-04 LAB — VITAMIN D 25 HYDROXY (VIT D DEFICIENCY, FRACTURES): VIT D 25 HYDROXY: 48 ng/mL (ref 30–89)

## 2013-11-08 MED ORDER — LEVOTHYROXINE SODIUM 25 MCG PO TABS: 25.0000 ug | ORAL_TABLET | Freq: Every day | ORAL | Status: DC

## 2013-11-08 NOTE — Addendum Note (Signed)
Addended by: Elvina MattesSIMMONS, Olga Bourbeau T on: 11/08/2013 08:48 AM   Modules accepted: Orders

## 2013-11-10 ENCOUNTER — Ambulatory Visit (INDEPENDENT_AMBULATORY_CARE_PROVIDER_SITE_OTHER): Payer: 59 | Admitting: *Deleted

## 2013-11-10 ENCOUNTER — Other Ambulatory Visit: Payer: Self-pay | Admitting: *Deleted

## 2013-11-10 DIAGNOSIS — Z23 Encounter for immunization: Secondary | ICD-10-CM

## 2013-11-10 MED ORDER — "SYRINGE/NEEDLE (DISP) 25G X 5/8"" 1 ML MISC": Status: DC

## 2013-11-10 NOTE — Telephone Encounter (Signed)
Meds refilled.

## 2013-12-19 ENCOUNTER — Other Ambulatory Visit: Payer: Self-pay | Admitting: Family Medicine

## 2014-02-08 ENCOUNTER — Other Ambulatory Visit: Payer: Self-pay | Admitting: Family Medicine

## 2014-02-17 ENCOUNTER — Other Ambulatory Visit: Payer: Self-pay | Admitting: Family Medicine

## 2014-02-17 ENCOUNTER — Other Ambulatory Visit: Payer: 59

## 2014-02-17 ENCOUNTER — Encounter: Payer: Self-pay | Admitting: Family Medicine

## 2014-02-17 ENCOUNTER — Ambulatory Visit (INDEPENDENT_AMBULATORY_CARE_PROVIDER_SITE_OTHER): Payer: 59 | Admitting: Family Medicine

## 2014-02-17 VITALS — BP 100/60 | HR 76 | Temp 97.0°F | Resp 22 | Ht 73.0 in | Wt 269.0 lb

## 2014-02-17 DIAGNOSIS — J8410 Pulmonary fibrosis, unspecified: Secondary | ICD-10-CM

## 2014-02-17 DIAGNOSIS — Z125 Encounter for screening for malignant neoplasm of prostate: Secondary | ICD-10-CM

## 2014-02-17 DIAGNOSIS — E039 Hypothyroidism, unspecified: Secondary | ICD-10-CM

## 2014-02-17 DIAGNOSIS — E119 Type 2 diabetes mellitus without complications: Secondary | ICD-10-CM

## 2014-02-17 DIAGNOSIS — E785 Hyperlipidemia, unspecified: Secondary | ICD-10-CM

## 2014-02-17 DIAGNOSIS — I10 Essential (primary) hypertension: Secondary | ICD-10-CM

## 2014-02-17 DIAGNOSIS — Z79899 Other long term (current) drug therapy: Secondary | ICD-10-CM

## 2014-02-17 DIAGNOSIS — I2510 Atherosclerotic heart disease of native coronary artery without angina pectoris: Secondary | ICD-10-CM

## 2014-02-17 LAB — COMPLETE METABOLIC PANEL WITH GFR
ALBUMIN: 4.2 g/dL (ref 3.5–5.2)
ALT: 17 U/L (ref 0–53)
AST: 17 U/L (ref 0–37)
Alkaline Phosphatase: 65 U/L (ref 39–117)
BILIRUBIN TOTAL: 0.8 mg/dL (ref 0.2–1.2)
BUN: 24 mg/dL — AB (ref 6–23)
CO2: 21 mEq/L (ref 19–32)
Calcium: 9.1 mg/dL (ref 8.4–10.5)
Chloride: 106 mEq/L (ref 96–112)
Creat: 1.62 mg/dL — ABNORMAL HIGH (ref 0.50–1.35)
GFR, EST AFRICAN AMERICAN: 53 mL/min — AB
GFR, EST NON AFRICAN AMERICAN: 45 mL/min — AB
GLUCOSE: 109 mg/dL — AB (ref 70–99)
POTASSIUM: 4.1 meq/L (ref 3.5–5.3)
Sodium: 142 mEq/L (ref 135–145)
Total Protein: 6.5 g/dL (ref 6.0–8.3)

## 2014-02-17 LAB — TSH: TSH: 8.335 u[IU]/mL — ABNORMAL HIGH (ref 0.350–4.500)

## 2014-02-17 MED ORDER — ZOLPIDEM TARTRATE 10 MG PO TABS: ORAL_TABLET | ORAL | Status: DC

## 2014-02-17 NOTE — Patient Instructions (Signed)
Continue novolog with meals but ADD a correction to it based on your blood sugar before meals. <150 -0 untis 151-200-2units 201-250-4 units 251-300-6 units 301-350-8 units 351-400- 10 units THIS IS ADDED TO YOUR REGULAR DOSE OF NOVOLOG

## 2014-02-17 NOTE — Telephone Encounter (Signed)
Rx Refilled - ok'd by [facility]

## 2014-02-17 NOTE — Progress Notes (Signed)
Subjective:    Patient ID: Jerry NewtonRobert S Bogden, male    DOB: 07/15/1956, 58 y.o.   MRN: [id number]  HPI  At the patient's last visit, his TSH was found to be elevated. This patient on levothyroxine 25 mcg by mouth daily for mild subclinical hypothyroidism. This was done primarily to his fatigue and lack of energy. He is currently battling COPD along with pulmonary fibrosis. He is now oxygen dependent. Hoping that by adding the medication and hopefully help alleviate some of his fatigue. He states that he does feel better since starting the medication. His TSH was drawn this morning. I do not have the results back as of yet. He is currently taking 60 units of Lantus once a day, and 10 units of NovoLog with breakfast and 20 units of NovoLog and Lantus at. He never had the correction factor I explained after his last lab work.  He will be losing his insurance in September. Past Medical History  Diagnosis Date  . Diabetes mellitus   . Hypertension   . Hyperlipidemia   . Postinflammatory pulmonary fibrosis   . CAD (coronary artery disease)   . Pneumothorax on right 4/11  . Shortness of breath     with exertion   . Sleep apnea     does not use CPAP   . COPD (chronic obstructive pulmonary disease)    Current Outpatient Prescriptions on File Prior to Visit  Medication Sig Dispense Refill  . ADVAIR HFA 115-21 MCG/ACT inhaler Inhale 2 puffs into the lungs 2 (two) times daily.      Marland Kitchen. albuterol (PROVENTIL HFA;VENTOLIN HFA) 108 (90 BASE) MCG/ACT inhaler Inhale 2 puffs into the lungs every 6 (six) hours as needed for wheezing.  1 Inhaler  0  . aspirin 81 MG tablet Take 81 mg by mouth daily.      Marland Kitchen. atorvastatin (LIPITOR) 40 MG tablet Take 1 tablet by mouth  every night at bedtime  90 tablet  3  . clopidogrel (PLAVIX) 75 MG tablet Take 1 tablet by mouth  every day  90 tablet  3  . furosemide (LASIX) 20 MG tablet Take 20 mg by mouth daily as needed (fluid).       Marland Kitchen. glucose blood test strip Use as instructed   100 each  12  . glyBURIDE (DIABETA) 5 MG tablet Take 1 tablet by mouth two  times daily  180 tablet  3  . insulin aspart (NOVOLOG) 100 UNIT/ML injection 10 units with breakfast and lunch, 20 units With supper  3 vial  5  . Insulin Pen Needle (PEN NEEDLES 31GX5/16") 31G X 8 MM MISC As directed  100 each  5  . isosorbide mononitrate (IMDUR) 60 MG 24 hr tablet Take 1 and 1/2 tablets by  mouth every day  135 tablet  1  . Lancets (ACCU-CHEK MULTICLIX) lancets Use as instructed  100 each  12  . LANTUS SOLOSTAR 100 UNIT/ML Solostar Pen Inject subcutaneously 65  units every night at  bedtime  60 mL  3  . levothyroxine (LEVOTHROID) 25 MCG tablet Take 1 tablet (25 mcg total) by mouth daily before breakfast.  30 tablet  2  . lidocaine (LIDODERM) 5 % Place 1 patch onto the skin daily.      Marland Kitchen. losartan (COZAAR) 100 MG tablet Take 1 tablet by mouth  every day  90 tablet  3  . metFORMIN (GLUCOPHAGE) 850 MG tablet Take 1 tablet by mouth two  times daily  180 tablet  3  . metoprolol (TOPROL-XL) 200 MG 24 hr tablet Take one-half tablet by  mouth every day  45 tablet  1  . niacin (NIASPAN) 1000 MG CR tablet Take 1 tablet by mouth  every day  90 tablet  3  . nitroGLYCERIN (NITROSTAT) 0.4 MG SL tablet Place 0.4 mg under the tongue every 5 (five) minutes as needed for chest pain.       Marland Kitchen. SPIRIVA HANDIHALER 18 MCG inhalation capsule Place 1 capsule into inhaler and inhale daily.      . SYRINGE/NEEDLE, DISP, 1 ML (B-D SYRINGE/NEEDLE 1CC/25GX5/8) 25G X 5/8" 1 ML MISC As directed  100 each  5   No current facility-administered medications on file prior to visit.   Allergies  Allergen Reactions  . Altace [Ramipril]     Cough    History   Social History  . Marital Status: Divorced    Spouse Name: N/A    Number of Children: 1  . Years of Education: N/A   Occupational History  .  [employer]   Social History Main Topics  . Smoking status: Former Smoker -- 1.50 packs/day for 35 years    Types: Cigarettes     Quit date: 10/24/2007  . Smokeless tobacco: Never Used  . Alcohol Use: Yes     Comment: social  . Drug Use: No  . Sexual Activity: Not on file   Other Topics Concern  . Not on file   Social History Narrative  . No narrative on file    Review of Systems  All other systems reviewed and are negative.      Objective:   Physical Exam  Neck: Neck supple. No thyromegaly present.  Cardiovascular: Normal rate, regular rhythm and normal heart sounds.   Pulmonary/Chest: Effort normal. He has decreased breath sounds. He has wheezes.  Abdominal: Soft. Bowel sounds are normal.  Musculoskeletal: He exhibits no edema.          Assessment & Plan:  Postinflammatory pulmonary fibrosis  Type II or unspecified type diabetes mellitus without mention of complication, not stated as uncontrolled  Unspecified hypothyroidism   Patient states his fasting blood sugars around 130 and is to have postprandial sugars in the 160s. The sounds well controlled. I recommended he continue his Lantus and NovoLog exactly as he is doing them at the present time. However I also printed out a correction factor that I want him to use based on his pre-meal blood sugar. He will take 2 additional units of NovoLog for every 50 points higher than 150 on his pre-meal blood sugar..  I'll await the results of his TSH and titrate his levothyroxine to achieve a TSH and a therapeutic window. Have also recommended that he contacts us a security office. The patient is currently on disability and unable to work due to his breathing conditions. Hopefully she will qualify for some kind of government assistance such as Medicare since he is disabled especially since he will be losing his insurance in September.

## 2014-02-18 ENCOUNTER — Other Ambulatory Visit: Payer: Self-pay | Admitting: Family Medicine

## 2014-02-20 ENCOUNTER — Telehealth: Payer: Self-pay | Admitting: Family Medicine

## 2014-02-20 DIAGNOSIS — Z79899 Other long term (current) drug therapy: Secondary | ICD-10-CM

## 2014-02-20 DIAGNOSIS — E039 Hypothyroidism, unspecified: Secondary | ICD-10-CM

## 2014-02-20 MED ORDER — LEVOTHYROXINE SODIUM 50 MCG PO TABS
50.0000 ug | ORAL_TABLET | Freq: Every day | ORAL | Status: DC
Start: 2014-02-20 — End: 2014-03-11

## 2014-02-20 NOTE — Telephone Encounter (Signed)
Message copied by Donne AnonPLUMMER, Tahni Porchia M on Fri Feb 20, 2014 12:32 PM ------      Message from: Lynnea FerrierPICKARD, WARREN      Created: Thu Feb 19, 2014  7:19 AM       Thyroid is still low, increase levothyroxine to 50 mcg poqday and recheck tsh in 2 months (no ov needed). ------

## 2014-02-20 NOTE — Telephone Encounter (Signed)
Pt not available.  Spoke with wife.  Told need increase Levothyroxine to 50 mcg daily (can take two of the 25 mcg he already has)  New Rx to pharmacy.  Need to repeat TSH in 2 months.  Only lab visit for that.

## 2014-03-11 ENCOUNTER — Other Ambulatory Visit: Payer: Self-pay | Admitting: Family Medicine

## 2014-03-11 DIAGNOSIS — E039 Hypothyroidism, unspecified: Secondary | ICD-10-CM

## 2014-03-11 MED ORDER — LEVOTHYROXINE SODIUM 50 MCG PO TABS: 50.0000 ug | ORAL_TABLET | Freq: Every day | ORAL | Status: DC

## 2014-03-11 MED ORDER — ZOLPIDEM TARTRATE 10 MG PO TABS: ORAL_TABLET | ORAL | Status: DC

## 2014-03-11 NOTE — Telephone Encounter (Signed)
Rx Refilled and Ambien faxed

## 2014-03-13 ENCOUNTER — Other Ambulatory Visit: Payer: Self-pay | Admitting: Family Medicine

## 2014-03-13 DIAGNOSIS — E039 Hypothyroidism, unspecified: Secondary | ICD-10-CM

## 2014-03-13 MED ORDER — LEVOTHYROXINE SODIUM 50 MCG PO TABS: 50.0000 ug | ORAL_TABLET | Freq: Every day | ORAL | Status: DC

## 2014-03-13 NOTE — Telephone Encounter (Signed)
Rx Refilled  

## 2014-03-15 ENCOUNTER — Other Ambulatory Visit: Payer: Self-pay | Admitting: Family Medicine

## 2014-03-17 ENCOUNTER — Telehealth: Payer: Self-pay | Admitting: Family Medicine

## 2014-03-17 NOTE — Telephone Encounter (Signed)
[facility] calling to clarify script sent to them on ambien for this patient would like to clarify the frequency  Phone number is [phone] ref num [id number]

## 2014-03-19 NOTE — Telephone Encounter (Signed)
Call returned to [facility].   Stated that due to Ambien prescription being controlled substance, they would only be able to refill x1 additional time.   Gave verbal order for #90 with 1 refill.

## 2014-04-13 ENCOUNTER — Other Ambulatory Visit: Payer: 59

## 2014-04-13 DIAGNOSIS — E039 Hypothyroidism, unspecified: Secondary | ICD-10-CM

## 2014-04-13 DIAGNOSIS — Z79899 Other long term (current) drug therapy: Secondary | ICD-10-CM

## 2014-04-13 LAB — TSH: TSH: 5.326 u[IU]/mL — ABNORMAL HIGH (ref 0.350–4.500)

## 2014-04-14 ENCOUNTER — Other Ambulatory Visit: Payer: Self-pay | Admitting: Family Medicine

## 2014-04-15 ENCOUNTER — Other Ambulatory Visit: Payer: Self-pay | Admitting: Family Medicine

## 2014-04-15 MED ORDER — LEVOTHYROXINE SODIUM 75 MCG PO TABS: 75.0000 ug | ORAL_TABLET | Freq: Every day | ORAL | Status: DC

## 2014-04-17 ENCOUNTER — Encounter: Payer: Self-pay | Admitting: Family Medicine

## 2014-04-17 ENCOUNTER — Ambulatory Visit (INDEPENDENT_AMBULATORY_CARE_PROVIDER_SITE_OTHER): Payer: 59 | Admitting: Family Medicine

## 2014-04-17 VITALS — BP 120/70 | HR 60 | Temp 96.8°F | Resp 22 | Ht 73.0 in | Wt 267.0 lb

## 2014-04-17 DIAGNOSIS — F418 Other specified anxiety disorders: Secondary | ICD-10-CM

## 2014-04-17 DIAGNOSIS — E039 Hypothyroidism, unspecified: Secondary | ICD-10-CM

## 2014-04-17 DIAGNOSIS — F411 Generalized anxiety disorder: Secondary | ICD-10-CM

## 2014-04-17 MED ORDER — ALPRAZOLAM 0.5 MG PO TABS: 0.5000 mg | ORAL_TABLET | Freq: Three times a day (TID) | ORAL | Status: DC | PRN

## 2014-04-17 NOTE — Progress Notes (Signed)
Subjective:    Patient ID: Jerry NewtonRobert S Montgomery, male    DOB: 06/10/1956, 58 y.o.   MRN: [id number]  HPI Patient history of hypothyroidism. He is currently on 50 mcg of levothyroxine daily. His TSH has fallen from 8.067 to 5.325.  Patient does better since starting to take levothyroxine. He has less fatigue and more energy. He denies any palpitations.  However he is having situational anxiety. Since I last saw the patient, his wife has moved back in, his daughter has moved back in, as the patient describes "its a full house."  This frequently leads and very anxious and frustrated. He quickly loses his temper.  He occasionally feels anxious and panicked. He would like to take something on an as-needed basis just to help calm down anxiety. He also occasionally has anxiety stemming from dyspnea related to his pulmonary fibrosis. Past Medical History  Diagnosis Date  . Diabetes mellitus   . Hypertension   . Hyperlipidemia   . Postinflammatory pulmonary fibrosis   . CAD (coronary artery disease)   . Pneumothorax on right 4/11  . Shortness of breath     with exertion   . Sleep apnea     does not use CPAP   . COPD (chronic obstructive pulmonary disease)    Past Surgical History  Procedure Laterality Date  . Coronary stent placement  03/05/2003    PCI & Stent  LAD & mid CX  . Elbow surgery    . Rotator cuff repair    . Bilateral vats ablation      rt  . Cardiac catheterization  05/03/2007    patent stents  . Right knee surgery       2012   . Knee arthroscopy Right 01/22/2013    Procedure: RIGHT ARTHROSCOPY KNEE WITH DEBRIDEMENT, abrasion chondroplasty of lateral tibial plateau, debridement of partial tear of ACL, menisectomy;  Surgeon: Jacki Conesonald A Gioffre, MD;  Location: [facility];  Service: Orthopedics;  Laterality: Right;  . Coronary angioplasty with stent placement  01/13/2010    PCI & stent to CX   Allergies  Allergen Reactions  . Altace [Ramipril]     Cough    History   Social History  .  Marital Status: Divorced    Spouse Name: N/A    Number of Children: 1  . Years of Education: N/A   Occupational History  .  [employer]   Social History Main Topics  . Smoking status: Former Smoker -- 1.50 packs/day for 35 years    Types: Cigarettes    Quit date: 10/24/2007  . Smokeless tobacco: Never Used  . Alcohol Use: Yes     Comment: social  . Drug Use: No  . Sexual Activity: Not on file   Other Topics Concern  . Not on file   Social History Narrative  . No narrative on file      Review of Systems  All other systems reviewed and are negative.      Objective:   Physical Exam  Vitals reviewed. Neck: No thyromegaly present.  Cardiovascular: Normal rate, regular rhythm and normal heart sounds.   No murmur heard. Pulmonary/Chest: Effort normal. He has decreased breath sounds. He has rhonchi.  Musculoskeletal: He exhibits no edema.          Assessment & Plan:  1. Situational anxiety I will give the patient Xanax 0.5 mg by mouth every 8 hours when necessary anxiety. All given 30 tablets. I expect the patient to use this sparingly.  30 tablet should last several months. - ALPRAZolam (XANAX) 0.5 MG tablet; Take 1 tablet (0.5 mg total) by mouth 3 (three) times daily as needed for anxiety.  Dispense: 30 tablet; Refill: 0  2. Unspecified hypothyroidism Increase levothyroxine to 75 mcg by mouth daily and recheck a TSH in 6 weeks.

## 2014-04-24 ENCOUNTER — Other Ambulatory Visit: Payer: Self-pay | Admitting: Family Medicine

## 2014-05-05 DIAGNOSIS — R001 Bradycardia, unspecified: Secondary | ICD-10-CM | POA: Insufficient documentation

## 2014-05-11 ENCOUNTER — Ambulatory Visit (INDEPENDENT_AMBULATORY_CARE_PROVIDER_SITE_OTHER): Payer: 59 | Admitting: Cardiovascular Disease

## 2014-05-11 ENCOUNTER — Encounter: Payer: Self-pay | Admitting: Cardiovascular Disease

## 2014-05-11 VITALS — BP 140/86 | HR 70 | Ht 73.0 in | Wt 267.2 lb

## 2014-05-11 DIAGNOSIS — R001 Bradycardia, unspecified: Secondary | ICD-10-CM

## 2014-05-11 DIAGNOSIS — I4949 Other premature depolarization: Secondary | ICD-10-CM

## 2014-05-11 DIAGNOSIS — E785 Hyperlipidemia, unspecified: Secondary | ICD-10-CM

## 2014-05-11 DIAGNOSIS — I2510 Atherosclerotic heart disease of native coronary artery without angina pectoris: Secondary | ICD-10-CM

## 2014-05-11 DIAGNOSIS — I493 Ventricular premature depolarization: Secondary | ICD-10-CM

## 2014-05-11 DIAGNOSIS — R002 Palpitations: Secondary | ICD-10-CM

## 2014-05-11 DIAGNOSIS — I498 Other specified cardiac arrhythmias: Secondary | ICD-10-CM

## 2014-05-11 DIAGNOSIS — I10 Essential (primary) hypertension: Secondary | ICD-10-CM

## 2014-05-11 DIAGNOSIS — J8410 Pulmonary fibrosis, unspecified: Secondary | ICD-10-CM

## 2014-05-11 DIAGNOSIS — E119 Type 2 diabetes mellitus without complications: Secondary | ICD-10-CM

## 2014-05-11 NOTE — Patient Instructions (Signed)
Your physician recommends that you schedule a follow-up appointment in: September  Your physician has recommended that you wear a holter monitor. Holter monitors are medical devices that record the heart's electrical activity. Doctors most often use these monitors to diagnose arrhythmias. Arrhythmias are problems with the speed or rhythm of the heartbeat. The monitor is a small, portable device. You can wear one while you do your normal daily activities. This is usually used to diagnose what is causing palpitations/syncope (passing out).  Your physician has requested that you have an echocardiogram. Echocardiography is a painless test that uses sound waves to create images of your heart. It provides your doctor with information about the size and shape of your heart and how well your heart's chambers and valves are working. This procedure takes approximately one hour. There are no restrictions for this procedure.

## 2014-05-11 NOTE — Progress Notes (Signed)
Patient ID: Jerry NewtonRobert S Montgomery, male   DOB: 01/13/1956, 58 y.o.   MRN: [id number]     HPI: Jerry NewtonRobert S Baysinger is a 58 y.o. Jerry Nasutimalewho presents for 9 month cardiology evaluation.  [name] has history of known coronary artery disease in May 2004 underwent stenting of his proximal LAD and proximal circumflex coronary arteries. In July 2008 he developed a new 85% stenosis of the mid circumflex vessel at which time a 3.5x13 mm Cypher stent was inserted. His 2 previously placed stents remain patent. At his last catheterization in March 2011 he was found to have 95% stenosis between the tube previously placed stents in the circumflex and a new 3.0x20 mm post and was inserted to cover the mid area and also cover the proximal region. His LAD stent was patent with 40% proximal and mid stenosis obtuse marginal 1 vessel at 60-70% narrowing the distal circumflex had 30% narrowing and a nondominant right coronary artery had 80% stenosis. In 2012, he ruptured his quadriceps tendon and underwent surgery by Dr. Darrelyn HillockGioffre and tolerated this well from a cardiac standpoint. He does have a remote history of tobacco use. Has a history of hyperlipidemia, hypertension, type 2 diabetes mellitus, as well as emphysematous blebs. In May 2001 he underwent resection of apical bullae pleurectomy and pleurocentesis. He had been followed by Dr. Sherene SiresWert for pulmonary care and now some care of Dr. Harlon DittyNaiman at [facility]. Apparently he was started on oxygen. He denies any anginal symptoms. Is unaware of tachycardia dysrhythmias.  He tells me, now.  He's been diagnosed with pulmonary fibrosis.  He has been on home oxygen since December 2014.  He denies anginal symptoms.  He is unaware of wheezing.  He is unaware of palpitations.  He does note shortness of breath with activity.  He also was recently felt to have slow pulse.  He is unaware of palpitations, but at times does note a palpitation sensation in his neck.  Past Medical History    Diagnosis Date  . Diabetes mellitus   . Hypertension   . Hyperlipidemia   . Postinflammatory pulmonary fibrosis   . CAD (coronary artery disease)   . Pneumothorax on right 4/11  . Shortness of breath     with exertion   . Sleep apnea     does not use CPAP   . COPD (chronic obstructive pulmonary disease)     Past Surgical History  Procedure Laterality Date  . Coronary stent placement  03/05/2003    PCI & Stent  LAD & mid CX  . Elbow surgery    . Rotator cuff repair    . Bilateral vats ablation      rt  . Cardiac catheterization  05/03/2007    patent stents  . Right knee surgery       2012   . Knee arthroscopy Right 01/22/2013    Procedure: RIGHT ARTHROSCOPY KNEE WITH DEBRIDEMENT, abrasion chondroplasty of lateral tibial plateau, debridement of partial tear of ACL, menisectomy;  Surgeon: Jacki Conesonald A Gioffre, MD;  Location: [facility];  Service: Orthopedics;  Laterality: Right;  . Coronary angioplasty with stent placement  01/13/2010    PCI & stent to CX    Allergies  Allergen Reactions  . Altace [Ramipril]     Cough     Current Outpatient Prescriptions  Medication Sig Dispense Refill  . ADVAIR HFA 115-21 MCG/ACT inhaler Inhale 2 puffs into the lungs 2 (two) times daily.      Marland Kitchen. albuterol (PROVENTIL HFA;VENTOLIN  HFA) 108 (90 BASE) MCG/ACT inhaler Inhale 2 puffs into the lungs every 6 (six) hours as needed for wheezing.  1 Inhaler  0  . ALPRAZolam (XANAX) 0.5 MG tablet Take 1 tablet (0.5 mg total) by mouth 3 (three) times daily as needed for anxiety.  30 tablet  0  . aspirin 81 MG tablet Take 81 mg by mouth daily.      Marland Kitchen atorvastatin (LIPITOR) 40 MG tablet Take 1 tablet by mouth  every night at bedtime  90 tablet  3  . B-D ULTRAFINE III SHORT PEN 31G X 8 MM MISC Use as directed  240 each  3  . clopidogrel (PLAVIX) 75 MG tablet Take 1 tablet by mouth  every day  90 tablet  3  . furosemide (LASIX) 20 MG tablet Take 20 mg by mouth daily as needed (fluid).       Marland Kitchen glucose blood  (ACCU-CHEK AVIVA PLUS) test strip Check bs 4-5 times per day [id number]  100 each  11  . glyBURIDE (DIABETA) 5 MG tablet Take 1 tablet by mouth  twice a day  180 tablet  3  . isosorbide mononitrate (IMDUR) 60 MG 24 hr tablet Take 1 and 1/2 tablets by  mouth every day  135 tablet  1  . Lancets (ACCU-CHEK MULTICLIX) lancets Use as instructed  100 each  12  . LANTUS SOLOSTAR 100 UNIT/ML Solostar Pen Inject subcutaneously 65  units every night at  bedtime  60 mL  3  . levothyroxine (SYNTHROID, LEVOTHROID) 75 MCG tablet Take 1 tablet by mouth  daily  90 tablet  2  . lidocaine (LIDODERM) 5 % Place 1 patch onto the skin daily.      Marland Kitchen losartan (COZAAR) 100 MG tablet Take 1 tablet by mouth  every day  90 tablet  3  . metFORMIN (GLUCOPHAGE) 850 MG tablet Take 1 tablet by mouth  twice a day  180 tablet  3  . metoprolol (TOPROL-XL) 200 MG 24 hr tablet Take one-half tablet by  mouth every day  45 tablet  1  . niacin (NIASPAN) 1000 MG CR tablet Take 1 tablet by mouth  every day  90 tablet  3  . nitroGLYCERIN (NITROSTAT) 0.4 MG SL tablet Place 0.4 mg under the tongue every 5 (five) minutes as needed for chest pain.       Marland Kitchen NOVOLOG 100 UNIT/ML injection 10 UNITS WITH BREAKFAST AND LUNCH, 20 UNITS WITH SUPPER  30 mL  4  . SPIRIVA HANDIHALER 18 MCG inhalation capsule Place 1 capsule into inhaler and inhale daily.      . SYRINGE/NEEDLE, DISP, 1 ML (B-D SYRINGE/NEEDLE 1CC/25GX5/8) 25G X 5/8" 1 ML MISC As directed  100 each  5  . zolpidem (AMBIEN) 10 MG tablet TAKE 1 TABLET BY MOUTH AS NEEDED FOR SLEEP  90 tablet  2   No current facility-administered medications for this visit.    History   Social History  . Marital Status: Divorced    Spouse Name: N/A    Number of Children: 1  . Years of Education: N/A   Occupational History  .  [employer]   Social History Main Topics  . Smoking status: Former Smoker -- 1.50 packs/day for 35 years    Types: Cigarettes    Quit date: 10/24/2007  . Smokeless  tobacco: Never Used  . Alcohol Use: Yes     Comment: social  . Drug Use: No  . Sexual Activity: Not on file  Other Topics Concern  . Not on file   Social History Narrative  . No narrative on file    Family History  Problem Relation Age of Onset  . Breast cancer Mother   . Lung cancer Father   . Diabetes type II Mother    Socially he has one child. There is a remote tobacco history quit in 2009. He is not routinely exercise.  ROS General: Negative; No fevers, chills, or night sweats;  HEENT: Negative; No changes in vision or hearing, sinus congestion, difficulty swallowing Pulmonary: Positive for recent diagnosis of pulmonary fibrosis. No cough, wheezing, shortness of breath, hemoptysis Cardiovascular: See history of present illness No chest pain, presyncope, syncope,  GI: Negative; No nausea, vomiting, diarrhea, or abdominal pain GU: Negative; No dysuria, hematuria, or difficulty voiding Musculoskeletal: Negative; no myalgias, joint pain, or weakness Hematologic/Oncology: Negative; no easy bruising, bleeding Endocrine: Positive for hyperlipidemia , hypothyroidism, and type 2 diabetes mellitus. Neuro: Negative; no changes in balance, headaches Skin: Negative; No rashes or skin lesions Psychiatric: Negative; No behavioral problems, depression Sleep: Negative; No snoring, daytime sleepiness, hypersomnolence, bruxism, restless legs, hypnogognic hallucinations, no cataplexy Other comprehensive 14 point system review is negative.   PE BP 140/86  Pulse 70  Ht 6\' 1"  (1.854 m)  Wt 267 lb 3.2 oz (121.201 kg)  BMI 35.26 kg/m2  General: Alert, oriented, no distress.  Skin: normal turgor, no rashes HEENT: Normocephalic, atraumatic. Pupils round and reactive; sclera anicteric;no lid lag.  Nose without nasal septal hypertrophy Mouth/Parynx benign; Mallinpatti scale 3 Neck: No JVD, no carotid bruits with normal carotid upstroke Lungs: Slightly decreased breath sounds.; no wheezing  or rales Heart: RRR, s1 s2 normal 1/6 systolic murmur; no diastolic murmur, rubs, thrills or heaves Abdomen: soft, nontender; no hepatosplenomehaly, BS+; abdominal aorta nontender and not dilated by palpation. Pulses 2+ Extremities: Bilateral knee scars. no clubbing cyanosis or edema, Homan's sign negative  Neurologic: grossly nonfocal Psychologic: normal affect and mood.  ECG (independently read by me) sinus rhythm at 70 beats per minute with frequent PVCs with evidence for VA conduction.  Old inferior infarct.  Left axis deviation.  ECG: Sinus rhythm with left axis deviation. Incomplete right bundle branch block. Inferior posterior infarction changes.  LABS:  BMET    Component Value Date/Time   NA 142 02/17/2014 0819   K 4.1 02/17/2014 0819   CL 106 02/17/2014 0819   CO2 21 02/17/2014 0819   GLUCOSE 109* 02/17/2014 0819   BUN 24* 02/17/2014 0819   CREATININE 1.62* 02/17/2014 0819   CREATININE 1.09 01/20/2013 1420   CALCIUM 9.1 02/17/2014 0819   GFRNONAA 45* 02/17/2014 0819   GFRNONAA 72* 01/20/2013 1420   GFRAA 53* 02/17/2014 0819   GFRAA 84* 01/20/2013 1420     Hepatic Function Panel     Component Value Date/Time   PROT 6.5 02/17/2014 0819   ALBUMIN 4.2 02/17/2014 0819   AST 17 02/17/2014 0819   ALT 17 02/17/2014 0819   ALKPHOS 65 02/17/2014 0819   BILITOT 0.8 02/17/2014 0819     CBC    Component Value Date/Time   WBC 6.4 11/03/2013 0950   RBC 4.64 11/03/2013 0950   HGB 14.3 11/03/2013 0950   HCT 42.2 11/03/2013 0950   PLT 174 11/03/2013 0950   MCV 90.9 11/03/2013 0950   MCH 30.8 11/03/2013 0950   MCHC 33.9 11/03/2013 0950   RDW 14.7 11/03/2013 0950   LYMPHSABS 1.6 11/03/2013 0950   MONOABS 0.7 11/03/2013 0950   EOSABS  0.2 11/03/2013 0950   BASOSABS 0.0 11/03/2013 0950     BNP    Component Value Date/Time   PROBNP 286.0* 03/21/2010 0350    Lipid Panel     Component Value Date/Time   CHOL 127 11/03/2013 0950   TRIG 153* 11/03/2013 0950   HDL 37* 11/03/2013 0950   CHOLHDL 3.4  11/03/2013 0950   VLDL 31 11/03/2013 0950   LDLCALC 59 11/03/2013 0950     RADIOLOGY: No results found.    ASSESSMENT AND PLAN:  Mr. Pavel is a 58 year old gentleman with established coronary artery disease and is status post intervention to his LAD and circumflex vessel initially 10 years ago. He's had subsequent interventions in 2008 and most recently in 2011 as noted above. A nuclear perfusion study in February 2014  suggested normal perfusion without significant scar or ischemia. The study was not gated secondary to ectopy.  A chest CT in October 2014 suggested bronchiectasis, groundglass attenuation, interstitial reticulation, and honeycombing.  He is now felt to have pulmonary fibrosis and is followed at [facility] and is on supplemental oxygen.  On physical exam today.  He does have occasional PVCs.  This may explain that his sensation of fluttering in his neck.  May also explain his apical impulse perhaps being slow, if his PVCs were nonconducted.  I scheduled him to undergo a 2-D echo Doppler study.  He will also undergo a 48-hour Holter monitor.  He currently is on Toprol 100 mg daily, now, we'll not make any change to his regimen until the Holter results.  He's on combination therapy with Niaspan 1000 mg in addition to atorvastatin 40 mg for his hyperlipidemia.  He's tolerating dual antiplatelet therapy with aspirin and Plavix.  He does take Lasix on an as-needed basis for edema.  He denies any chest pain on current dose of Imdur 90 mg in addition to his beta blocker therapy.  He also is on levothyroxine for hypothyroidism in addition to his insulin.  Presently, there is no wheezing, and he continues to take Spiriva.  I will see him in the office in followup in September and further recommendations will be made at that time.    Lennette Bihari, MD, [facility]  05/11/2014 6:18 PM

## 2014-05-14 ENCOUNTER — Encounter (INDEPENDENT_AMBULATORY_CARE_PROVIDER_SITE_OTHER): Payer: 59

## 2014-05-14 ENCOUNTER — Encounter: Payer: Self-pay | Admitting: *Deleted

## 2014-05-14 DIAGNOSIS — I493 Ventricular premature depolarization: Secondary | ICD-10-CM

## 2014-05-14 DIAGNOSIS — R002 Palpitations: Secondary | ICD-10-CM

## 2014-05-14 DIAGNOSIS — R001 Bradycardia, unspecified: Secondary | ICD-10-CM

## 2014-05-14 DIAGNOSIS — I4949 Other premature depolarization: Secondary | ICD-10-CM

## 2014-05-14 DIAGNOSIS — I498 Other specified cardiac arrhythmias: Secondary | ICD-10-CM

## 2014-05-14 NOTE — Progress Notes (Signed)
Patient ID: Jerry NewtonRobert S Montgomery, male   DOB: 12/08/1955, 58 y.o.   MRN: [id number] [facility] 48 hour holter monitor applied to patient.

## 2014-05-18 ENCOUNTER — Ambulatory Visit (HOSPITAL_COMMUNITY): Payer: 59

## 2014-06-08 ENCOUNTER — Other Ambulatory Visit (HOSPITAL_COMMUNITY): Payer: Self-pay | Admitting: Cardiovascular Disease

## 2014-06-08 DIAGNOSIS — I493 Ventricular premature depolarization: Secondary | ICD-10-CM

## 2014-06-10 ENCOUNTER — Ambulatory Visit (HOSPITAL_COMMUNITY)
Admission: RE | Admit: 2014-06-10 | Discharge: 2014-06-10 | Disposition: A | Discharge status: Home / Self Care | Payer: 59 | Source: Ambulatory Visit | Attending: Cardiology | Admitting: Cardiology

## 2014-06-10 DIAGNOSIS — I2510 Atherosclerotic heart disease of native coronary artery without angina pectoris: Secondary | ICD-10-CM | POA: Diagnosis not present

## 2014-06-10 DIAGNOSIS — I493 Ventricular premature depolarization: Secondary | ICD-10-CM

## 2014-06-10 DIAGNOSIS — I2789 Other specified pulmonary heart diseases: Secondary | ICD-10-CM | POA: Insufficient documentation

## 2014-06-10 DIAGNOSIS — R9431 Abnormal electrocardiogram [ECG] [EKG]: Secondary | ICD-10-CM | POA: Diagnosis present

## 2014-06-10 DIAGNOSIS — I517 Cardiomegaly: Secondary | ICD-10-CM

## 2014-06-10 DIAGNOSIS — R0789 Other chest pain: Secondary | ICD-10-CM

## 2014-06-10 NOTE — Progress Notes (Signed)
2D Echocardiogram Complete.  06/10/2014   Shawniece Oyola, RDCS  

## 2014-06-17 ENCOUNTER — Telehealth: Payer: Self-pay | Admitting: Cardiovascular Disease

## 2014-06-17 NOTE — Telephone Encounter (Signed)
RN spoke to Dr [name],  Dr [name] reviewed ECHO. Dr Royann Shivers reviewed ECHO and stated EF 55-60% . Per Dr [name], information given to patient-- area corresponds with recent PCI, EF 55-60% Patient verbalized understanding.

## 2014-06-17 NOTE — Telephone Encounter (Signed)
Patient needs Echo results.

## 2014-07-02 ENCOUNTER — Other Ambulatory Visit: Payer: 59

## 2014-07-02 DIAGNOSIS — I10 Essential (primary) hypertension: Secondary | ICD-10-CM

## 2014-07-02 DIAGNOSIS — I498 Other specified cardiac arrhythmias: Secondary | ICD-10-CM

## 2014-07-02 DIAGNOSIS — Z79899 Other long term (current) drug therapy: Secondary | ICD-10-CM

## 2014-07-02 DIAGNOSIS — E785 Hyperlipidemia, unspecified: Secondary | ICD-10-CM

## 2014-07-02 DIAGNOSIS — E119 Type 2 diabetes mellitus without complications: Secondary | ICD-10-CM

## 2014-07-02 LAB — HEMOGLOBIN A1C
Hgb A1c MFr Bld: 8.4 % — ABNORMAL HIGH (ref <5.7)
Mean Plasma Glucose: 194 mg/dL — ABNORMAL HIGH (ref <117)

## 2014-07-02 LAB — CBC WITH DIFFERENTIAL/PLATELET
BASOS ABS: 0 10*3/uL (ref 0.0–0.1)
Basophils Relative: 0 % (ref 0–1)
EOS PCT: 3 % (ref 0–5)
Eosinophils Absolute: 0.2 10*3/uL (ref 0.0–0.7)
HCT: 41.6 % (ref 39.0–52.0)
Hemoglobin: 13.9 g/dL (ref 13.0–17.0)
LYMPHS ABS: 1.5 10*3/uL (ref 0.7–4.0)
Lymphocytes Relative: 22 % (ref 12–46)
MCH: 29.4 pg (ref 26.0–34.0)
MCHC: 33.4 g/dL (ref 30.0–36.0)
MCV: 88.1 fL (ref 78.0–100.0)
Monocytes Absolute: 0.9 10*3/uL (ref 0.1–1.0)
Monocytes Relative: 13 % — ABNORMAL HIGH (ref 3–12)
Neutro Abs: 4.2 10*3/uL (ref 1.7–7.7)
Neutrophils Relative %: 62 % (ref 43–77)
Platelets: 198 10*3/uL (ref 150–400)
RBC: 4.72 MIL/uL (ref 4.22–5.81)
RDW: 14.4 % (ref 11.5–15.5)
WBC: 6.8 10*3/uL (ref 4.0–10.5)

## 2014-07-02 LAB — LIPID PANEL
CHOLESTEROL: 113 mg/dL (ref 0–200)
HDL: 28 mg/dL — AB (ref >39)
LDL CALC: 55 mg/dL (ref 0–99)
Total CHOL/HDL Ratio: 4 Ratio
Triglycerides: 152 mg/dL — ABNORMAL HIGH (ref <150)
VLDL: 30 mg/dL (ref 0–40)

## 2014-07-02 LAB — TSH: TSH: 5.541 u[IU]/mL — ABNORMAL HIGH (ref 0.350–4.500)

## 2014-07-03 ENCOUNTER — Other Ambulatory Visit: Payer: Self-pay | Admitting: Family Medicine

## 2014-07-09 ENCOUNTER — Encounter: Payer: Self-pay | Admitting: Family Medicine

## 2014-07-09 ENCOUNTER — Ambulatory Visit (INDEPENDENT_AMBULATORY_CARE_PROVIDER_SITE_OTHER): Payer: 59 | Admitting: Family Medicine

## 2014-07-09 VITALS — BP 110/68 | HR 64 | Temp 97.6°F | Resp 20 | Ht 73.0 in | Wt 266.0 lb

## 2014-07-09 DIAGNOSIS — I10 Essential (primary) hypertension: Secondary | ICD-10-CM

## 2014-07-09 DIAGNOSIS — E785 Hyperlipidemia, unspecified: Secondary | ICD-10-CM

## 2014-07-09 DIAGNOSIS — E039 Hypothyroidism, unspecified: Secondary | ICD-10-CM

## 2014-07-09 DIAGNOSIS — E119 Type 2 diabetes mellitus without complications: Secondary | ICD-10-CM

## 2014-07-09 MED ORDER — LINAGLIPTIN 5 MG PO TABS: 5.0000 mg | ORAL_TABLET | Freq: Every day | ORAL | Status: DC

## 2014-07-09 MED ORDER — LEVOTHYROXINE SODIUM 100 MCG PO TABS: ORAL_TABLET | ORAL | Status: DC

## 2014-07-09 MED ORDER — INSULIN ASPART 100 UNIT/ML ~~LOC~~ SOLN: SUBCUTANEOUS | Status: DC

## 2014-07-09 NOTE — Progress Notes (Signed)
Subjective:    Patient ID: Jerry Montgomery, male    DOB: March 23, 1956, 58 y.o.   MRN: [id number]  HPI Patient has a complicated past medical history including coronary artery disease, COPD, pulmonary fibrosis, and bronchiectasis. He also has insulin-dependent type 2 diabetes mellitus as well as hypothyroidism.  His most recent lab work is listed below: Lab on 07/02/2014  Component Date Value Ref Range Status  . Cholesterol 07/02/2014 113  0 - 200 mg/dL Final   Comment: ATP III Classification:                                < 200        mg/dL        Desirable                               200 - 239     mg/dL        Borderline High                               >= 240        mg/dL        High                             . Triglycerides 07/02/2014 152* <150 mg/dL Final  . HDL 07/02/2014 28* >39 mg/dL Final  . Total CHOL/HDL Ratio 07/02/2014 4.0   Final  . VLDL 07/02/2014 30  0 - 40 mg/dL Final  . LDL Cholesterol 07/02/2014 55  0 - 99 mg/dL Final   Comment:                            Total Cholesterol/HDL Ratio:CHD Risk                                                 Coronary Heart Disease Risk Table                                                                 Men       Women                                   1/2 Average Risk              3.4        3.3                                       Average Risk              5.0        4.4  2X Average Risk              9.6        7.1                                    3X Average Risk             23.4       11.0                          Use the calculated Patient Ratio above and the CHD Risk table                           to determine the patient's CHD Risk.                          ATP III Classification (LDL):                                < 100        mg/dL         Optimal                               100 - 129     mg/dL         Near or Above Optimal                               130 - 159     mg/dL          Borderline High                               160 - 189     mg/dL         High                                > 190        mg/dL         Very High                             . Hemoglobin A1C 07/02/2014 8.4* <5.7 % Final   Comment:                                                                                                 According to the ADA Clinical Practice Recommendations for 2011, when                          HbA1c is used  as a screening test:                                                       >=6.5%   Diagnostic of Diabetes Mellitus                                     (if abnormal result is confirmed)                                                     5.7-6.4%   Increased risk of developing Diabetes Mellitus                                                     References:Diagnosis and Classification of Diabetes Mellitus,Diabetes                          [id number],[id number],68(LEXNT 1):S62-S69 and Standards of Medical Care in                                  Diabetes - 2011,Diabetes [id number],49 (Suppl 1):S11-S61.                             . Mean Plasma Glucose 07/02/2014 194* <117 mg/dL Final  . WBC 07/02/2014 6.8  4.0 - 10.5 K/uL Final  . RBC 07/02/2014 4.72  4.22 - 5.81 MIL/uL Final  . Hemoglobin 07/02/2014 13.9  13.0 - 17.0 g/dL Final  . HCT 07/02/2014 41.6  39.0 - 52.0 % Final  . MCV 07/02/2014 88.1  78.0 - 100.0 fL Final  . MCH 07/02/2014 29.4  26.0 - 34.0 pg Final  . MCHC 07/02/2014 33.4  30.0 - 36.0 g/dL Final  . RDW 07/02/2014 14.4  11.5 - 15.5 % Final  . Platelets 07/02/2014 198  150 - 400 K/uL Final  . Neutrophils Relative % 07/02/2014 62  43 - 77 % Final  . Neutro Abs 07/02/2014 4.2  1.7 - 7.7 K/uL Final  . Lymphocytes Relative 07/02/2014 22  12 - 46 % Final  . Lymphs Abs 07/02/2014 1.5  0.7 - 4.0 K/uL Final  . Monocytes Relative 07/02/2014 13* 3 - 12 % Final  . Monocytes Absolute 07/02/2014 0.9  0.1 - 1.0 K/uL Final  . Eosinophils Relative 07/02/2014 3  0 - 5 %  Final  . Eosinophils Absolute 07/02/2014 0.2  0.0 - 0.7 K/uL Final  . Basophils Relative 07/02/2014 0  0 - 1 % Final  . Basophils Absolute 07/02/2014 0.0  0.0 - 0.1 K/uL Final  . Smear Review 07/02/2014 Criteria for review not met   Final  . TSH 07/02/2014 5.541* 0.350 - 4.500 uIU/mL Final    He is currently on 75 mcg by mouth daily levothyroxin. His TSH is still subtherapeutic. He does complain of some  fatigue. He is compliant in taking his medication. His hemoglobin A1c is elevated at 8.4. He is currently taking 65 units of Lantus every day and then NovoLog with meals. However he frequently only eats one meal a day. He states his fasting blood sugars can vary widely.  This morning he was having a hypoglycemic episode in the 40s which he rapidly treated with soda. Unfortunately it makes it difficult to up titrate his insulin further based on his hypoglycemic episodes. Furthermore he would be a poor candidate for Actos given his history of coronary artery disease. His LDL is excellent. However his HDL is low at 28. Temperature he is unable to exercise due to his pulmonary fibrosis. He is already on Niaspan. Past Medical History  Diagnosis Date  . Diabetes mellitus   . Hypertension   . Hyperlipidemia   . Postinflammatory pulmonary fibrosis   . CAD (coronary artery disease)   . Pneumothorax on right 4/11  . Shortness of breath     with exertion   . Sleep apnea     does not use CPAP   . COPD (chronic obstructive pulmonary disease)    Past Surgical History  Procedure Laterality Date  . Coronary stent placement  03/05/2003    PCI & Stent  LAD & mid CX  . Elbow surgery    . Rotator cuff repair    . Bilateral vats ablation      rt  . Cardiac catheterization  05/03/2007    patent stents  . Right knee surgery       2012   . Knee arthroscopy Right 01/22/2013    Procedure: RIGHT ARTHROSCOPY KNEE WITH DEBRIDEMENT, abrasion chondroplasty of lateral tibial plateau, debridement of partial tear of  ACL, menisectomy;  Surgeon: Tobi Bastos, MD;  Location: [facility];  Service: Orthopedics;  Laterality: Right;  . Coronary angioplasty with stent placement  01/13/2010    PCI & stent to CX   Current Outpatient Prescriptions on File Prior to Visit  Medication Sig Dispense Refill  . ADVAIR HFA 115-21 MCG/ACT inhaler Inhale 2 puffs into the lungs 2 (two) times daily.      Marland Kitchen albuterol (PROVENTIL HFA;VENTOLIN HFA) 108 (90 BASE) MCG/ACT inhaler Inhale 2 puffs into the lungs every 6 (six) hours as needed for wheezing.  1 Inhaler  0  . ALPRAZolam (XANAX) 0.5 MG tablet Take 1 tablet (0.5 mg total) by mouth 3 (three) times daily as needed for anxiety.  30 tablet  0  . aspirin 81 MG tablet Take 81 mg by mouth daily.      Marland Kitchen atorvastatin (LIPITOR) 40 MG tablet Take 1 tablet by mouth  every night at bedtime  90 tablet  3  . B-D ULTRAFINE III SHORT PEN 31G X 8 MM MISC Use as directed  240 each  3  . clopidogrel (PLAVIX) 75 MG tablet Take 1 tablet by mouth  every day  90 tablet  3  . furosemide (LASIX) 20 MG tablet Take 20 mg by mouth daily as needed (fluid).       Marland Kitchen glucose blood (ACCU-CHEK AVIVA PLUS) test strip Check bs 4-5 times per day DX-250.00  100 each  11  . glyBURIDE (DIABETA) 5 MG tablet Take 1 tablet by mouth  twice a day  180 tablet  3  . isosorbide mononitrate (IMDUR) 60 MG 24 hr tablet Take 1 and 1/2 tablets by  mouth every day  135 tablet  1  . Lancets (ACCU-CHEK MULTICLIX) lancets Use  as instructed  100 each  12  . LANTUS SOLOSTAR 100 UNIT/ML Solostar Pen Inject subcutaneously 65  units every night at  bedtime  60 mL  3  . lidocaine (LIDODERM) 5 % Place 1 patch onto the skin daily.      Marland Kitchen losartan (COZAAR) 100 MG tablet Take 1 tablet by mouth  every day  90 tablet  3  . metFORMIN (GLUCOPHAGE) 850 MG tablet Take 1 tablet by mouth  twice a day  180 tablet  3  . metoprolol (TOPROL-XL) 200 MG 24 hr tablet Take one-half tablet by  mouth every day  45 tablet  1  . niacin (NIASPAN) 1000 MG CR tablet  Take 1 tablet by mouth  every day  90 tablet  3  . nitroGLYCERIN (NITROSTAT) 0.4 MG SL tablet Place 0.4 mg under the tongue every 5 (five) minutes as needed for chest pain.       Marland Kitchen SPIRIVA HANDIHALER 18 MCG inhalation capsule Place 1 capsule into inhaler and inhale daily.      . SYRINGE/NEEDLE, DISP, 1 ML (B-D SYRINGE/NEEDLE 1CC/25GX5/8) 25G X 5/8" 1 ML MISC As directed  100 each  5  . zolpidem (AMBIEN) 10 MG tablet TAKE 1 TABLET BY MOUTH AS NEEDED FOR SLEEP  90 tablet  2   No current facility-administered medications on file prior to visit.   Allergies  Allergen Reactions  . Altace [Ramipril]     Cough    History   Social History  . Marital Status: Divorced    Spouse Name: N/A    Number of Children: 1  . Years of Education: N/A   Occupational History  .  [employer]   Social History Main Topics  . Smoking status: Former Smoker -- 1.50 packs/day for 35 years    Types: Cigarettes    Quit date: 10/24/2007  . Smokeless tobacco: Never Used  . Alcohol Use: Yes     Comment: social  . Drug Use: No  . Sexual Activity: Not on file   Other Topics Concern  . Not on file   Social History Narrative  . No narrative on file      Review of Systems  All other systems reviewed and are negative.      Objective:   Physical Exam  Vitals reviewed. Constitutional: He appears well-developed and well-nourished.  HENT:  Mouth/Throat: Oropharynx is clear and moist.  Eyes: Conjunctivae are normal. No scleral icterus.  Neck: Neck supple. No JVD present. No thyromegaly present.  Cardiovascular: Normal rate, regular rhythm and normal heart sounds.   No murmur heard. Pulmonary/Chest: Effort normal. No respiratory distress. He has decreased breath sounds. He has no wheezes. He has no rales. He exhibits no tenderness.  Abdominal: Soft. Bowel sounds are normal. He exhibits no distension and no mass. There is no tenderness. There is no rebound and no guarding.  Musculoskeletal: He  exhibits no edema.  Lymphadenopathy:    He has no cervical adenopathy.   Patient is oxygen dependent.       Assessment & Plan:  Unspecified hypothyroidism  Type II or unspecified type diabetes mellitus without mention of complication, not stated as uncontrolled  HLD (hyperlipidemia)  Essential hypertension   Patient's blood pressure is well controlled. On magnetic changes in his blood pressure medication. His LDL cholesterol is acceptable even given his history of coronary artery disease. Unfortunately I have been unable to raise his HDL cholesterol even with a combination of Niaspan and Lipitor. Have encouraged  patient to exercise much as his breathing would permit. Patient's TSH reveals that his levothyroxin is therapeutic. I will increase levothyroxine to 100 mcg by mouth daily. Regarding his diabetes, added tradjenta 5 mg poqday to try to decrease his postprandial sugars without triggering quite as much hypoglycemia as his insulin. I gave the patient 4 months of samples.  Recheck hemoglobin A1c as well as TSH in 3 months.

## 2014-07-13 ENCOUNTER — Encounter: Payer: Self-pay | Admitting: Cardiovascular Disease

## 2014-07-13 ENCOUNTER — Ambulatory Visit (INDEPENDENT_AMBULATORY_CARE_PROVIDER_SITE_OTHER): Payer: 59 | Admitting: Cardiovascular Disease

## 2014-07-13 VITALS — BP 107/67 | HR 69 | Ht 73.0 in | Wt 265.8 lb

## 2014-07-13 DIAGNOSIS — I2510 Atherosclerotic heart disease of native coronary artery without angina pectoris: Secondary | ICD-10-CM

## 2014-07-13 DIAGNOSIS — I10 Essential (primary) hypertension: Secondary | ICD-10-CM

## 2014-07-13 DIAGNOSIS — E119 Type 2 diabetes mellitus without complications: Secondary | ICD-10-CM

## 2014-07-13 DIAGNOSIS — J8410 Pulmonary fibrosis, unspecified: Secondary | ICD-10-CM

## 2014-07-13 DIAGNOSIS — E785 Hyperlipidemia, unspecified: Secondary | ICD-10-CM

## 2014-07-13 DIAGNOSIS — J449 Chronic obstructive pulmonary disease, unspecified: Secondary | ICD-10-CM

## 2014-07-13 NOTE — Patient Instructions (Signed)
Your physician wants you to follow-up in:May 2016. You will receive a reminder letter in the mail two months in advance. If you don't receive a letter, please call our office to schedule the follow-up appointment. No changes were made today in your therapy.

## 2014-07-13 NOTE — Progress Notes (Signed)
Patient ID: Jerry Montgomery, male   DOB: Oct 17, 1956, 58 y.o.   MRN: [id number]     HPI: Jerry Montgomery is a 58 y.o. Jerry Montgomery presents for 2 month cardiology evaluation.  Jerry Montgomery has history of known coronary artery disease in May 2004 underwent stenting of his proximal LAD and proximal circumflex coronary arteries. In July 2008 he developed a new 85% stenosis of the mid circumflex vessel at which time a 3.5x13 mm Cypher stent was inserted. His 2 previously placed stents remain patent. At his last catheterization in March 2011 he was found to have 95% stenosis between the tube previously placed stents in the circumflex and a new 3.0x20 mm post and was inserted to cover the mid area and also cover the proximal region. His LAD stent was patent with 40% proximal and mid stenosis obtuse marginal 1 vessel at 60-70% narrowing the distal circumflex had 30% narrowing and a nondominant right coronary artery had 80% stenosis. In 2012, he ruptured his quadriceps tendon and underwent surgery by Dr. Darrelyn Hillock and tolerated this well from a cardiac standpoint. He does have a remote history of tobacco use. Has a history of hyperlipidemia, hypertension, type 2 diabetes mellitus, as well as emphysematous blebs. In May 2001 he underwent resection of apical bullae pleurectomy and pleurocentesis. He had been followed by Dr. Sherene Sires for pulmonary care and now some care of Dr. Harlon Ditty at [facility]. Apparently he was started on oxygen. He denies any anginal symptoms. Is unaware of tachycardia dysrhythmias.  He has been diagnosed with pulmonary fibrosis.  He has been on home oxygen since December 2014.  He denies anginal symptoms.  He is unaware of wheezing.  He is unaware of palpitations.  He does note shortness of breath with activity.  He denies any claudication.  He denies any presyncope or syncope.  Laboratory from one week ago showed a cholesterol of 113, triglycerides 152, HDL 28, LDL 55 on his atorvastatin 40 mg.   Hemoglobin A1c was increased at 8.4.  His TSH was slightly increased at 5.5 and his Synthroid dose was just increased from 75 mcg to 100 mcg daily.  An echo Doppler study on 06/10/2014 showed an ejection fraction of 55-60%.  There was grade 2 diastolic dysfunction.  He had moderate left atrial dilatation.  There was moderate pulmonary hypertension with a PA pressure 48 mm.  He presents now for evaluation.  Past Medical History  Diagnosis Date  . Diabetes mellitus   . Hypertension   . Hyperlipidemia   . Postinflammatory pulmonary fibrosis   . CAD (coronary artery disease)   . Pneumothorax on right 4/11  . Shortness of breath     with exertion   . Sleep apnea     does not use CPAP   . COPD (chronic obstructive pulmonary disease)     Past Surgical History  Procedure Laterality Date  . Coronary stent placement  03/05/2003    PCI & Stent  LAD & mid CX  . Elbow surgery    . Rotator cuff repair    . Bilateral vats ablation      rt  . Cardiac catheterization  05/03/2007    patent stents  . Right knee surgery       2012   . Knee arthroscopy Right 01/22/2013    Procedure: RIGHT ARTHROSCOPY KNEE WITH DEBRIDEMENT, abrasion chondroplasty of lateral tibial plateau, debridement of partial tear of ACL, menisectomy;  Surgeon: Jacki Cones, MD;  Location: [facility];  Service: Orthopedics;  Laterality: Right;  . Coronary angioplasty with stent placement  01/13/2010    PCI & stent to CX    Allergies  Allergen Reactions  . Altace [Ramipril]     Cough     Current Outpatient Prescriptions  Medication Sig Dispense Refill  . ADVAIR HFA 115-21 MCG/ACT inhaler Inhale 2 puffs into the lungs 2 (two) times daily.      Marland Kitchen albuterol (PROVENTIL HFA;VENTOLIN HFA) 108 (90 BASE) MCG/ACT inhaler Inhale 2 puffs into the lungs every 6 (six) hours as needed for wheezing.  1 Inhaler  0  . ALPRAZolam (XANAX) 0.5 MG tablet Take 1 tablet (0.5 mg total) by mouth 3 (three) times daily as needed for anxiety.  30 tablet   0  . aspirin 81 MG tablet Take 81 mg by mouth daily.      Marland Kitchen atorvastatin (LIPITOR) 40 MG tablet Take 1 tablet by mouth  every night at bedtime  90 tablet  3  . B-D ULTRAFINE III SHORT PEN 31G X 8 MM MISC Use as directed  240 each  3  . clopidogrel (PLAVIX) 75 MG tablet Take 1 tablet by mouth  every day  90 tablet  3  . furosemide (LASIX) 20 MG tablet Take 20 mg by mouth daily as needed (fluid).       Marland Kitchen glucose blood (ACCU-CHEK AVIVA PLUS) test strip Check bs 4-5 times per day DX-250.00  100 each  11  . glyBURIDE (DIABETA) 5 MG tablet Take 1 tablet by mouth  twice a day  180 tablet  3  . insulin aspart (NOVOLOG) 100 UNIT/ML injection 10 UNITS WITH BREAKFAST AND LUNCH, 20 UNITS WITH SUPPER  100 mL  3  . insulin aspart (NOVOLOG) 100 UNIT/ML injection 10 UNITS WITH BREAKFAST AND LUNCH, 20 UNITS WITH SUPPER  30 mL  4  . isosorbide mononitrate (IMDUR) 60 MG 24 hr tablet Take 1 and 1/2 tablets by  mouth every day  135 tablet  1  . Lancets (ACCU-CHEK MULTICLIX) lancets Use as instructed  100 each  12  . LANTUS SOLOSTAR 100 UNIT/ML Solostar Pen Inject subcutaneously 65  units every night at  bedtime  60 mL  3  . levothyroxine (SYNTHROID, LEVOTHROID) 100 MCG tablet Take 1 tablet by mouth  daily  90 tablet  2  . lidocaine (LIDODERM) 5 % Place 1 patch onto the skin daily.      Marland Kitchen linagliptin (TRADJENTA) 5 MG TABS tablet Take 1 tablet (5 mg total) by mouth daily.  90 tablet  3  . losartan (COZAAR) 100 MG tablet Take 1 tablet by mouth  every day  90 tablet  3  . metFORMIN (GLUCOPHAGE) 850 MG tablet Take 1 tablet by mouth  twice a day  180 tablet  3  . metoprolol (TOPROL-XL) 200 MG 24 hr tablet Take one-half tablet by  mouth every day  45 tablet  1  . niacin (NIASPAN) 1000 MG CR tablet Take 1 tablet by mouth  every day  90 tablet  3  . nitroGLYCERIN (NITROSTAT) 0.4 MG SL tablet Place 0.4 mg under the tongue every 5 (five) minutes as needed for chest pain.       Marland Kitchen SPIRIVA HANDIHALER 18 MCG inhalation capsule  Place 1 capsule into inhaler and inhale daily.      . SYRINGE/NEEDLE, DISP, 1 ML (B-D SYRINGE/NEEDLE 1CC/25GX5/8) 25G X 5/8" 1 ML MISC As directed  100 each  5  . zolpidem (AMBIEN) 10 MG tablet  TAKE 1 TABLET BY MOUTH AS NEEDED FOR SLEEP  90 tablet  2   No current facility-administered medications for this visit.    History   Social History  . Marital Status: Divorced    Spouse Name: N/A    Number of Children: 1  . Years of Education: N/A   Occupational History  .  [employer]   Social History Main Topics  . Smoking status: Former Smoker -- 1.50 packs/day for 35 years    Types: Cigarettes    Quit date: 10/24/2007  . Smokeless tobacco: Never Used  . Alcohol Use: Yes     Comment: social  . Drug Use: No  . Sexual Activity: Not on file   Other Topics Concern  . Not on file   Social History Narrative  . No narrative on file    Family History  Problem Relation Age of Onset  . Breast cancer Mother   . Lung cancer Father   . Diabetes type II Mother    Socially he has one child. There is a remote tobacco history quit in 2009. He is not routinely exercise.  ROS General: Negative; No fevers, chills, or night sweats;  HEENT: Negative; No changes in vision or hearing, sinus congestion, difficulty swallowing Pulmonary: Positive for recent diagnosis of pulmonary fibrosis.  Supplemental oxygen.  Mild shortness of breath with activity.  No cough, wheezing, hemoptysis Cardiovascular: See history of present illness No chest pain, presyncope, syncope,  GI: Negative; No nausea, vomiting, diarrhea, or abdominal pain GU: Negative; No dysuria, hematuria, or difficulty voiding Musculoskeletal: Negative; no myalgias, joint pain, or weakness Hematologic/Oncology: Negative; no easy bruising, bleeding Endocrine: Positive for hyperlipidemia , hypothyroidism, and type 2 diabetes mellitus. Neuro: Negative; no changes in balance, headaches Skin: Negative; No rashes or skin  lesions Psychiatric: Negative; No behavioral problems, depression Sleep: Negative; No snoring, daytime sleepiness, hypersomnolence, bruxism, restless legs, hypnogognic hallucinations, no cataplexy Other comprehensive 14 point system review is negative.   PE BP 107/67  Pulse 69  Ht  (1.854 m)  Wt 265 lb 12.8 oz (120.566 kg)  BMI 35.08 kg/m2  General: Alert, oriented, no distress.  Skin: normal turgor, no rashes HEENT: Normocephalic, atraumatic. Pupils round and reactive; sclera anicteric;no lid lag.  Nose without nasal septal hypertrophy Mouth/Parynx benign; Mallinpatti scale 3 Neck: No JVD, no carotid bruits with normal carotid upstroke Lungs: Slightly decreased breath sounds.; no wheezing or rales Heart: RRR, s1 s2 normal 1/6 systolic murmur; no diastolic murmur, rubs, thrills or heaves Abdomen: soft, nontender; no hepatosplenomehaly, BS+; abdominal aorta nontender and not dilated by palpation. Pulses 2+ Extremities: Bilateral knee scars. no clubbing cyanosis or edema, Homan's sign negative  Neurologic: grossly nonfocal Psychologic: normal affect and mood.  ECG (independently read by me): Sinus rhythm at 69 beats per minute.  Right bundle branch block with repolarization changes. Two  Isolated, unifocal PVC's with a PR interval 108 ms.   05/11/2014 ECG (independently read by me) sinus rhythm at 70 beats per minute with frequent PVCs with evidence for VA conduction.  Old inferior infarct.  Left axis deviation.  Prior ECG: Sinus rhythm with left axis deviation. Incomplete right bundle branch block. Inferior posterior infarction changes.  LABS:  BMET    Component Value Date/Time   NA 142 02/17/2014 0819   K 4.1 02/17/2014 0819   CL 106 02/17/2014 0819   CO2 21 02/17/2014 0819   GLUCOSE 109* 02/17/2014 0819   BUN 24* 02/17/2014 0819   CREATININE 1.62* 02/17/2014  0819   CREATININE 1.09 01/20/2013 1420   CALCIUM 9.1 02/17/2014 0819   GFRNONAA 45* 02/17/2014 0819   GFRNONAA 72*  01/20/2013 1420   GFRAA 53* 02/17/2014 0819   GFRAA 84* 01/20/2013 1420     Hepatic Function Panel     Component Value Date/Time   PROT 6.5 02/17/2014 0819   ALBUMIN 4.2 02/17/2014 0819   AST 17 02/17/2014 0819   ALT 17 02/17/2014 0819   ALKPHOS 65 02/17/2014 0819   BILITOT 0.8 02/17/2014 0819     CBC    Component Value Date/Time   WBC 6.8 07/02/2014 1257   RBC 4.72 07/02/2014 1257   HGB 13.9 07/02/2014 1257   HCT 41.6 07/02/2014 1257   PLT 198 07/02/2014 1257   MCV 88.1 07/02/2014 1257   MCH 29.4 07/02/2014 1257   MCHC 33.4 07/02/2014 1257   RDW 14.4 07/02/2014 1257   LYMPHSABS 1.5 07/02/2014 1257   MONOABS 0.9 07/02/2014 1257   EOSABS 0.2 07/02/2014 1257   BASOSABS 0.0 07/02/2014 1257     BNP    Component Value Date/Time   PROBNP 286.0* 03/21/2010 0350    Lipid Panel     Component Value Date/Time   CHOL 113 07/02/2014 1257   TRIG 152* 07/02/2014 1257   HDL 28* 07/02/2014 1257   CHOLHDL 4.0 07/02/2014 1257   VLDL 30 07/02/2014 1257   LDLCALC 55 07/02/2014 1257     RADIOLOGY: No results found.    ASSESSMENT AND PLAN:  Mr. Blaney is a 58 year old gentleman with established coronary artery disease and is status post intervention to his LAD and circumflex vessel initially 10 years ago. He's had subsequent interventions in 2008 and most recently in 2011 as noted above. A nuclear perfusion study in February 2014  suggested normal perfusion without significant scar or ischemia. The study was not gated secondary to ectopy.  A chest CT in October 2014 suggested bronchiectasis, groundglass attenuation, interstitial reticulation, and honeycombing.  He is now felt to have pulmonary fibrosis and is followed at [facility] and is on supplemental oxygen.  A recent 2-D echo Doppler study showed normal systolic function with ejection fraction of 55-60%.  There was grade 2 diastolic dysfunction and evidence for moderate pulmonary hypertension with estimated PA pressure 48 mm.  His ventricular rate is  in the 60s on his current dose of Toprol 100 mg daily.  His blood pressure today is controlled.  He is on combination with atorvastatin 40 mg and Niaspan 1000 mg for hyperlipidemia.  He is tolerating dual antiplatelet therapy with Plavix and aspirin without bleeding.  Presently, there is no wheezing on Spiriva.  His primary physician is managing his diabetes and with his HbA1c of 8.4 medication adjustment may be necessary.  I will see him in May 2016 for followup evaluation or sooner if problems arise.   Lennette Bihari, MD, [facility]  07/13/2014 1:57 PM

## 2014-07-14 ENCOUNTER — Other Ambulatory Visit: Payer: Self-pay | Admitting: Family Medicine

## 2014-07-14 NOTE — Telephone Encounter (Signed)
Refill appropriate and filled per protocol. 

## 2014-11-09 ENCOUNTER — Other Ambulatory Visit: Payer: Self-pay | Admitting: Family Medicine

## 2014-11-09 NOTE — Telephone Encounter (Signed)
Medication called to pharmacy. 

## 2014-11-09 NOTE — Telephone Encounter (Signed)
Ok to refill??  Last office visit 07/09/2014.  Last refill 03/11/2014, #2 refills.

## 2014-11-09 NOTE — Telephone Encounter (Signed)
ok 

## 2015-01-01 ENCOUNTER — Other Ambulatory Visit: Payer: 59

## 2015-01-01 DIAGNOSIS — E039 Hypothyroidism, unspecified: Secondary | ICD-10-CM

## 2015-01-01 DIAGNOSIS — I10 Essential (primary) hypertension: Secondary | ICD-10-CM

## 2015-01-01 DIAGNOSIS — E785 Hyperlipidemia, unspecified: Secondary | ICD-10-CM

## 2015-01-01 DIAGNOSIS — E119 Type 2 diabetes mellitus without complications: Secondary | ICD-10-CM

## 2015-01-01 LAB — CBC WITH DIFFERENTIAL/PLATELET
BASOS PCT: 0 % (ref 0–1)
Basophils Absolute: 0 10*3/uL (ref 0.0–0.1)
EOS PCT: 1 % (ref 0–5)
Eosinophils Absolute: 0.1 10*3/uL (ref 0.0–0.7)
HCT: 42.6 % (ref 39.0–52.0)
Hemoglobin: 14.5 g/dL (ref 13.0–17.0)
Lymphocytes Relative: 22 % (ref 12–46)
Lymphs Abs: 1.5 10*3/uL (ref 0.7–4.0)
MCH: 29.4 pg (ref 26.0–34.0)
MCHC: 34 g/dL (ref 30.0–36.0)
MCV: 86.4 fL (ref 78.0–100.0)
MONO ABS: 0.7 10*3/uL (ref 0.1–1.0)
MPV: 9.2 fL (ref 8.6–12.4)
Monocytes Relative: 10 % (ref 3–12)
Neutro Abs: 4.6 10*3/uL (ref 1.7–7.7)
Neutrophils Relative %: 67 % (ref 43–77)
PLATELETS: 175 10*3/uL (ref 150–400)
RBC: 4.93 MIL/uL (ref 4.22–5.81)
RDW: 15.4 % (ref 11.5–15.5)
WBC: 6.8 10*3/uL (ref 4.0–10.5)

## 2015-01-01 LAB — COMPLETE METABOLIC PANEL WITH GFR
ALK PHOS: 55 U/L (ref 39–117)
ALT: 24 U/L (ref 0–53)
AST: 21 U/L (ref 0–37)
Albumin: 4 g/dL (ref 3.5–5.2)
BILIRUBIN TOTAL: 0.6 mg/dL (ref 0.2–1.2)
BUN: 16 mg/dL (ref 6–23)
CO2: 21 mEq/L (ref 19–32)
Calcium: 9.2 mg/dL (ref 8.4–10.5)
Chloride: 108 mEq/L (ref 96–112)
Creat: 1.26 mg/dL (ref 0.50–1.35)
GFR, EST AFRICAN AMERICAN: 71 mL/min
GFR, EST NON AFRICAN AMERICAN: 61 mL/min
Glucose, Bld: 51 mg/dL — ABNORMAL LOW (ref 70–99)
Potassium: 4 mEq/L (ref 3.5–5.3)
SODIUM: 142 meq/L (ref 135–145)
TOTAL PROTEIN: 6.6 g/dL (ref 6.0–8.3)

## 2015-01-01 LAB — LIPID PANEL
Cholesterol: 124 mg/dL (ref 0–200)
HDL: 38 mg/dL — AB (ref >=40)
LDL CALC: 64 mg/dL (ref 0–99)
TRIGLYCERIDES: 109 mg/dL (ref <150)
Total CHOL/HDL Ratio: 3.3 Ratio
VLDL: 22 mg/dL (ref 0–40)

## 2015-01-01 LAB — HEMOGLOBIN A1C
Hgb A1c MFr Bld: 7.6 % — ABNORMAL HIGH (ref <5.7)
Mean Plasma Glucose: 171 mg/dL — ABNORMAL HIGH (ref <117)

## 2015-01-01 LAB — TSH: TSH: 6.896 u[IU]/mL — AB (ref 0.350–4.500)

## 2015-01-12 ENCOUNTER — Encounter: Payer: Self-pay | Admitting: Family Medicine

## 2015-01-12 ENCOUNTER — Ambulatory Visit (INDEPENDENT_AMBULATORY_CARE_PROVIDER_SITE_OTHER): Payer: 59 | Admitting: Family Medicine

## 2015-01-12 VITALS — BP 120/80 | HR 60 | Temp 97.4°F | Resp 18 | Wt 263.0 lb

## 2015-01-12 DIAGNOSIS — E119 Type 2 diabetes mellitus without complications: Secondary | ICD-10-CM | POA: Diagnosis not present

## 2015-01-12 DIAGNOSIS — I10 Essential (primary) hypertension: Secondary | ICD-10-CM

## 2015-01-12 DIAGNOSIS — E038 Other specified hypothyroidism: Secondary | ICD-10-CM

## 2015-01-12 DIAGNOSIS — Z794 Long term (current) use of insulin: Secondary | ICD-10-CM

## 2015-01-12 DIAGNOSIS — IMO0001 Reserved for inherently not codable concepts without codable children: Secondary | ICD-10-CM

## 2015-01-12 DIAGNOSIS — E785 Hyperlipidemia, unspecified: Secondary | ICD-10-CM

## 2015-01-12 MED ORDER — INSULIN ASPART 100 UNIT/ML ~~LOC~~ SOLN: SUBCUTANEOUS | Status: DC

## 2015-01-12 MED ORDER — LEVOTHYROXINE SODIUM 125 MCG PO TABS: 125.0000 ug | ORAL_TABLET | Freq: Every day | ORAL | Status: DC

## 2015-01-12 MED ORDER — ZOLPIDEM TARTRATE 10 MG PO TABS: 10.0000 mg | ORAL_TABLET | Freq: Every evening | ORAL | Status: DC | PRN

## 2015-01-12 NOTE — Progress Notes (Signed)
Subjective:    Patient ID: Jerry Montgomery, male    DOB: [birth year], 59 y.o.   MRN: [id number]  HPI He is here today for follow-up of his medical conditions. Patient has insulin-dependent diabetes mellitus. He is currently on a combination of glyburide, metformin, tradjenta, Lantus 65 units daily, and NovoLog 10 units with breakfast and lunch and 20 units with dinner. Patient's hemoglobin A1c has fallen from 8.4-7.6.  Test his hemoglobin A1c has been in years. Patient has given up drinking sodas. I'm very prior the patient for doing this. He is having occasional episodes of hypoglycemia. I believe the patient is not guarding any benefit from continuing to take glyburide. Patient's most recent lab work as listed below. His HDL cholesterol has risen from 28-38 on Niaspan. I am very happy with this. He denies any myalgias or right upper quadrant pain. He is not able to exercise at the present time due to knee pain and due to his dyspnea. Therefore believe this is the best we will get his HDL cholesterol but I'm satisfied with this number. Unfortunately his TSH is still subtherapeutic at 6.8. He is currently on levothyroxine 100 g by mouth daily. His blood pressures well controlled today 120/80. He denies any chest pain. Lab on 01/01/2015  Component Date Value Ref Range Status  . WBC 01/01/2015 6.8  4.0 - 10.5 K/uL Final  . RBC 01/01/2015 4.93  4.22 - 5.81 MIL/uL Final  . Hemoglobin 01/01/2015 14.5  13.0 - 17.0 g/dL Final  . HCT 01/01/2015 42.6  39.0 - 52.0 % Final  . MCV 01/01/2015 86.4  78.0 - 100.0 fL Final  . MCH 01/01/2015 29.4  26.0 - 34.0 pg Final  . MCHC 01/01/2015 34.0  30.0 - 36.0 g/dL Final  . RDW 01/01/2015 15.4  11.5 - 15.5 % Final  . Platelets 01/01/2015 175  150 - 400 K/uL Final  . MPV 01/01/2015 9.2  8.6 - 12.4 fL Final  . Neutrophils Relative % 01/01/2015 67  43 - 77 % Final  . Neutro Abs 01/01/2015 4.6  1.7 - 7.7 K/uL Final  . Lymphocytes Relative 01/01/2015 22  12 - 46 % Final    . Lymphs Abs 01/01/2015 1.5  0.7 - 4.0 K/uL Final  . Monocytes Relative 01/01/2015 10  3 - 12 % Final  . Monocytes Absolute 01/01/2015 0.7  0.1 - 1.0 K/uL Final  . Eosinophils Relative 01/01/2015 1  0 - 5 % Final  . Eosinophils Absolute 01/01/2015 0.1  0.0 - 0.7 K/uL Final  . Basophils Relative 01/01/2015 0  0 - 1 % Final  . Basophils Absolute 01/01/2015 0.0  0.0 - 0.1 K/uL Final  . Smear Review 01/01/2015 Criteria for review not met   Final  . Sodium 01/01/2015 142  135 - 145 mEq/L Final  . Potassium 01/01/2015 4.0  3.5 - 5.3 mEq/L Final  . Chloride 01/01/2015 108  96 - 112 mEq/L Final  . CO2 01/01/2015 21  19 - 32 mEq/L Final  . Glucose, Bld 01/01/2015 51* 70 - 99 mg/dL Final  . BUN 01/01/2015 16  6 - 23 mg/dL Final  . Creat 01/01/2015 1.26  0.50 - 1.35 mg/dL Final  . Total Bilirubin 01/01/2015 0.6  0.2 - 1.2 mg/dL Final  . Alkaline Phosphatase 01/01/2015 55  39 - 117 U/L Final  . AST 01/01/2015 21  0 - 37 U/L Final  . ALT 01/01/2015 24  0 - 53 U/L Final  . Total Protein 01/01/2015  6.6  6.0 - 8.3 g/dL Final  . Albumin 01/01/2015 4.0  3.5 - 5.2 g/dL Final  . Calcium 01/01/2015 9.2  8.4 - 10.5 mg/dL Final  . GFR, Est African American 01/01/2015 71   Final  . GFR, Est Non African American 01/01/2015 61   Final   Comment:   The estimated GFR is a calculation valid for adults (>=40 years old) that uses the CKD-EPI algorithm to adjust for age and sex. It is   not to be used for children, pregnant women, hospitalized patients,    patients on dialysis, or with rapidly changing kidney function. According to the NKDEP, eGFR >89 is normal, 60-89 shows mild impairment, 30-59 shows moderate impairment, 15-29 shows severe impairment and <15 is ESRD.     Marland Kitchen Cholesterol 01/01/2015 124  0 - 200 mg/dL Final   Comment: ATP III Classification:       < 200        mg/dL        Desirable      200 - 239     mg/dL        Borderline High      >= 240        mg/dL        High     . Triglycerides  01/01/2015 109  <150 mg/dL Final  . HDL 01/01/2015 38* >=40 mg/dL Final   ** Please note change in reference range(s). **  . Total CHOL/HDL Ratio 01/01/2015 3.3   Final  . VLDL 01/01/2015 22  0 - 40 mg/dL Final  . LDL Cholesterol 01/01/2015 64  0 - 99 mg/dL Final   Comment:   Total Cholesterol/HDL Ratio:CHD Risk                        Coronary Heart Disease Risk Table                                        Men       Women          1/2 Average Risk              3.4        3.3              Average Risk              5.0        4.4           2X Average Risk              9.6        7.1           3X Average Risk             23.4       11.0 Use the calculated Patient Ratio above and the CHD Risk table  to determine the patient's CHD Risk. ATP III Classification (LDL):       < 100        mg/dL         Optimal      100 - 129     mg/dL         Near or Above Optimal      130 - 159     mg/dL         Borderline  High      160 - 189     mg/dL         High       > 190        mg/dL         Very High     . Hgb A1c MFr Bld 01/01/2015 7.6* <5.7 % Final   Comment:                                                                        According to the ADA Clinical Practice Recommendations for 2011, when HbA1c is used as a screening test:     >=6.5%   Diagnostic of Diabetes Mellitus            (if abnormal result is confirmed)   5.7-6.4%   Increased risk of developing Diabetes Mellitus   References:Diagnosis and Classification of Diabetes Mellitus,Diabetes JTTS,1779,39(QZESP 1):S62-S69 and Standards of Medical Care in         Diabetes - 2011,Diabetes QZRA,0762,26 (Suppl 1):S11-S61.     . Mean Plasma Glucose 01/01/2015 171* <117 mg/dL Final  . TSH 01/01/2015 6.896* 0.350 - 4.500 uIU/mL Final   Past Medical History  Diagnosis Date  . Diabetes mellitus   . Hypertension   . Hyperlipidemia   . Postinflammatory pulmonary fibrosis   . CAD (coronary artery disease)   . Pneumothorax on right 4/11    . Shortness of breath     with exertion   . Sleep apnea     does not use CPAP   . COPD (chronic obstructive pulmonary disease)    Past Surgical History  Procedure Laterality Date  . Coronary stent placement  03/05/2003    PCI & Stent  LAD & mid CX  . Elbow surgery    . Rotator cuff repair    . Bilateral vats ablation      rt  . Cardiac catheterization  05/03/2007    patent stents  . Right knee surgery       2012   . Knee arthroscopy Right 01/22/2013    Procedure: RIGHT ARTHROSCOPY KNEE WITH DEBRIDEMENT, abrasion chondroplasty of lateral tibial plateau, debridement of partial tear of ACL, menisectomy;  Surgeon: Tobi Bastos, MD;  Location: [facility];  Service: Orthopedics;  Laterality: Right;  . Coronary angioplasty with stent placement  01/13/2010    PCI & stent to CX   Current Outpatient Prescriptions on File Prior to Visit  Medication Sig Dispense Refill  . ACCU-CHEK SOFTCLIX LANCETS lancets USE AS INSTRUCTED 100 each 1  . ADVAIR HFA 115-21 MCG/ACT inhaler Inhale 2 puffs into the lungs 2 (two) times daily.    Marland Kitchen albuterol (PROVENTIL HFA;VENTOLIN HFA) 108 (90 BASE) MCG/ACT inhaler Inhale 2 puffs into the lungs every 6 (six) hours as needed for wheezing. 1 Inhaler 0  . ALPRAZolam (XANAX) 0.5 MG tablet Take 1 tablet (0.5 mg total) by mouth 3 (three) times daily as needed for anxiety. 30 tablet 0  . aspirin 81 MG tablet Take 81 mg by mouth daily.    Marland Kitchen atorvastatin (LIPITOR) 40 MG tablet Take 1 tablet by mouth  every night at bedtime 90 tablet 3  . B-D ULTRAFINE III SHORT PEN 31G X 8  MM MISC Use as directed 240 each 3  . clopidogrel (PLAVIX) 75 MG tablet Take 1 tablet by mouth  every day 90 tablet 3  . furosemide (LASIX) 20 MG tablet Take 20 mg by mouth daily as needed (fluid).     Marland Kitchen glucose blood (ACCU-CHEK AVIVA PLUS) test strip Check bs 4-5 times per day DX-250.00 100 each 11  . glyBURIDE (DIABETA) 5 MG tablet Take 1 tablet by mouth  twice a day 180 tablet 3  . insulin aspart  (NOVOLOG) 100 UNIT/ML injection 10 UNITS WITH BREAKFAST AND LUNCH, 20 UNITS WITH SUPPER 100 mL 3  . isosorbide mononitrate (IMDUR) 60 MG 24 hr tablet Take 1 and 1/2 tablets by  mouth every day 135 tablet 1  . LANTUS SOLOSTAR 100 UNIT/ML Solostar Pen Inject subcutaneously 65  units every night at  bedtime 60 mL 3  . levothyroxine (SYNTHROID, LEVOTHROID) 100 MCG tablet Take 1 tablet by mouth  daily 90 tablet 2  . lidocaine (LIDODERM) 5 % Place 1 patch onto the skin daily.    Marland Kitchen linagliptin (TRADJENTA) 5 MG TABS tablet Take 1 tablet (5 mg total) by mouth daily. 90 tablet 3  . losartan (COZAAR) 100 MG tablet Take 1 tablet by mouth  every day 90 tablet 3  . metFORMIN (GLUCOPHAGE) 850 MG tablet Take 1 tablet by mouth  twice a day 180 tablet 3  . metoprolol (TOPROL-XL) 200 MG 24 hr tablet Take one-half tablet by  mouth every day 45 tablet 1  . niacin (NIASPAN) 1000 MG CR tablet Take 1 tablet by mouth  every day 90 tablet 3  . nitroGLYCERIN (NITROSTAT) 0.4 MG SL tablet Place 0.4 mg under the tongue every 5 (five) minutes as needed for chest pain.     Marland Kitchen SPIRIVA HANDIHALER 18 MCG inhalation capsule Place 1 capsule into inhaler and inhale daily.    . SYRINGE/NEEDLE, DISP, 1 ML (B-D SYRINGE/NEEDLE 1CC/25GX5/8) 25G X 5/8" 1 ML MISC As directed 100 each 5   No current facility-administered medications on file prior to visit.   Allergies  Allergen Reactions  . Altace [Ramipril]     Cough    History   Social History  . Marital Status: Divorced    Spouse Name: N/A  . Number of Children: 1  . Years of Education: N/A   Occupational History  .  [employer]   Social History Main Topics  . Smoking status: Former Smoker -- 1.50 packs/day for 35 years    Types: Cigarettes    Quit date: 10/24/2007  . Smokeless tobacco: Never Used  . Alcohol Use: Yes     Comment: social  . Drug Use: No  . Sexual Activity: Not on file   Other Topics Concern  . Not on file   Social History Narrative      Review of Systems  All other systems reviewed and are negative.      Objective:   Physical Exam  Constitutional: He appears well-developed and well-nourished.  Neck: Neck supple. No JVD present. No thyromegaly present.  Cardiovascular: Normal rate, regular rhythm and normal heart sounds.  Exam reveals no gallop and no friction rub.   No murmur heard. Pulmonary/Chest: Effort normal. No respiratory distress. He has no wheezes. He has no rales. He exhibits no tenderness.  Abdominal: Soft. Bowel sounds are normal. He exhibits no distension and no mass. There is no tenderness. There is no rebound and no guarding.  Musculoskeletal: He exhibits no edema.  Lymphadenopathy:  He has no cervical adenopathy.  Vitals reviewed.         Assessment & Plan:  Other specified hypothyroidism - Plan: levothyroxine (SYNTHROID, LEVOTHROID) 125 MCG tablet  Essential hypertension  HLD (hyperlipidemia)  IDDM (insulin dependent diabetes mellitus)  Overall the patient is doing very well. 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 He is scheduled to see his pulmonologist at [facility] next month. His blood pressures well controlled. His cholesterol is the best it has been quite some time. His diabetes is much better than previous. I recommended the patient discontinue glyburide and try to continue to reduce the carbs in his diet. I've also recommended he try the exercises much as physically possible with his pulmonary problem. I recommended increasing levothyroxine to 125 g a day and recheck a TSH in 2 months. Otherwise his lab work is excellent.

## 2015-01-15 ENCOUNTER — Telehealth: Payer: Self-pay | Admitting: *Deleted

## 2015-01-15 MED ORDER — INSULIN LISPRO 100 UNIT/ML (KWIKPEN): PEN_INJECTOR | SUBCUTANEOUS | Status: DC

## 2015-01-15 NOTE — Telephone Encounter (Signed)
Prescription sent to pharmacy. .   Call placed to patient and patient made aware.  

## 2015-01-15 NOTE — Telephone Encounter (Signed)
Ok to switch to humalog 

## 2015-01-15 NOTE — Telephone Encounter (Signed)
Received request from pharmacy for PA on Novolog.   Humalog preferred.   MD please advise.

## 2015-02-02 ENCOUNTER — Emergency Department (HOSPITAL_COMMUNITY): Payer: Medicare Other

## 2015-02-02 ENCOUNTER — Other Ambulatory Visit (HOSPITAL_COMMUNITY): Payer: Self-pay

## 2015-02-02 ENCOUNTER — Ambulatory Visit (INDEPENDENT_AMBULATORY_CARE_PROVIDER_SITE_OTHER): Payer: Medicare Other | Admitting: Family Medicine

## 2015-02-02 ENCOUNTER — Encounter (HOSPITAL_COMMUNITY): Payer: Self-pay | Admitting: Emergency Medicine

## 2015-02-02 ENCOUNTER — Inpatient Hospital Stay (HOSPITAL_COMMUNITY)
Admission: EM | Admit: 2015-02-02 | Discharge: 2015-02-05 | DRG: 291 | Disposition: A | Discharge status: Home / Self Care | Payer: Medicare Other | Attending: Internal Medicine | Admitting: Internal Medicine

## 2015-02-02 ENCOUNTER — Encounter: Payer: Self-pay | Admitting: Family Medicine

## 2015-02-02 VITALS — BP 110/64 | HR 80 | Temp 97.5°F | Resp 24 | Ht 73.0 in | Wt 267.0 lb

## 2015-02-02 DIAGNOSIS — Z7902 Long term (current) use of antithrombotics/antiplatelets: Secondary | ICD-10-CM

## 2015-02-02 DIAGNOSIS — N179 Acute kidney failure, unspecified: Secondary | ICD-10-CM | POA: Diagnosis present

## 2015-02-02 DIAGNOSIS — Z8679 Personal history of other diseases of the circulatory system: Secondary | ICD-10-CM | POA: Diagnosis present

## 2015-02-02 DIAGNOSIS — J9621 Acute and chronic respiratory failure with hypoxia: Secondary | ICD-10-CM | POA: Diagnosis present

## 2015-02-02 DIAGNOSIS — Z9981 Dependence on supplemental oxygen: Secondary | ICD-10-CM

## 2015-02-02 DIAGNOSIS — R7989 Other specified abnormal findings of blood chemistry: Secondary | ICD-10-CM | POA: Diagnosis not present

## 2015-02-02 DIAGNOSIS — I509 Heart failure, unspecified: Secondary | ICD-10-CM

## 2015-02-02 DIAGNOSIS — G4733 Obstructive sleep apnea (adult) (pediatric): Secondary | ICD-10-CM | POA: Diagnosis present

## 2015-02-02 DIAGNOSIS — Z7982 Long term (current) use of aspirin: Secondary | ICD-10-CM | POA: Diagnosis not present

## 2015-02-02 DIAGNOSIS — IMO0001 Reserved for inherently not codable concepts without codable children: Secondary | ICD-10-CM

## 2015-02-02 DIAGNOSIS — J449 Chronic obstructive pulmonary disease, unspecified: Secondary | ICD-10-CM | POA: Diagnosis present

## 2015-02-02 DIAGNOSIS — I5031 Acute diastolic (congestive) heart failure: Secondary | ICD-10-CM | POA: Diagnosis present

## 2015-02-02 DIAGNOSIS — Z6835 Body mass index (BMI) 35.0-35.9, adult: Secondary | ICD-10-CM

## 2015-02-02 DIAGNOSIS — Z794 Long term (current) use of insulin: Secondary | ICD-10-CM | POA: Diagnosis not present

## 2015-02-02 DIAGNOSIS — I248 Other forms of acute ischemic heart disease: Secondary | ICD-10-CM | POA: Diagnosis present

## 2015-02-02 DIAGNOSIS — Z7901 Long term (current) use of anticoagulants: Secondary | ICD-10-CM

## 2015-02-02 DIAGNOSIS — Z955 Presence of coronary angioplasty implant and graft: Secondary | ICD-10-CM | POA: Diagnosis not present

## 2015-02-02 DIAGNOSIS — Z884 Allergy status to anesthetic agent status: Secondary | ICD-10-CM | POA: Diagnosis not present

## 2015-02-02 DIAGNOSIS — J84112 Idiopathic pulmonary fibrosis: Secondary | ICD-10-CM | POA: Diagnosis present

## 2015-02-02 DIAGNOSIS — E1165 Type 2 diabetes mellitus with hyperglycemia: Secondary | ICD-10-CM | POA: Diagnosis present

## 2015-02-02 DIAGNOSIS — I10 Essential (primary) hypertension: Secondary | ICD-10-CM | POA: Diagnosis present

## 2015-02-02 DIAGNOSIS — E785 Hyperlipidemia, unspecified: Secondary | ICD-10-CM | POA: Diagnosis present

## 2015-02-02 DIAGNOSIS — I4891 Unspecified atrial fibrillation: Secondary | ICD-10-CM | POA: Insufficient documentation

## 2015-02-02 DIAGNOSIS — Z87891 Personal history of nicotine dependence: Secondary | ICD-10-CM

## 2015-02-02 DIAGNOSIS — J9600 Acute respiratory failure, unspecified whether with hypoxia or hypercapnia: Secondary | ICD-10-CM

## 2015-02-02 DIAGNOSIS — E119 Type 2 diabetes mellitus without complications: Secondary | ICD-10-CM | POA: Diagnosis not present

## 2015-02-02 DIAGNOSIS — R0602 Shortness of breath: Secondary | ICD-10-CM | POA: Diagnosis present

## 2015-02-02 DIAGNOSIS — E039 Hypothyroidism, unspecified: Secondary | ICD-10-CM | POA: Diagnosis present

## 2015-02-02 DIAGNOSIS — I471 Supraventricular tachycardia: Secondary | ICD-10-CM | POA: Diagnosis present

## 2015-02-02 DIAGNOSIS — E876 Hypokalemia: Secondary | ICD-10-CM | POA: Diagnosis present

## 2015-02-02 DIAGNOSIS — J810 Acute pulmonary edema: Secondary | ICD-10-CM | POA: Diagnosis not present

## 2015-02-02 DIAGNOSIS — J9601 Acute respiratory failure with hypoxia: Secondary | ICD-10-CM | POA: Diagnosis not present

## 2015-02-02 DIAGNOSIS — I472 Ventricular tachycardia: Secondary | ICD-10-CM | POA: Diagnosis present

## 2015-02-02 DIAGNOSIS — I2510 Atherosclerotic heart disease of native coronary artery without angina pectoris: Secondary | ICD-10-CM | POA: Diagnosis present

## 2015-02-02 HISTORY — DX: Supraventricular tachycardia: I47.1

## 2015-02-02 HISTORY — DX: Acute diastolic (congestive) heart failure: I50.31

## 2015-02-02 HISTORY — DX: Other supraventricular tachycardia: I47.19

## 2015-02-02 LAB — GLUCOSE, CAPILLARY: GLUCOSE-CAPILLARY: 195 mg/dL — AB (ref 70–99)

## 2015-02-02 LAB — CBC WITH DIFFERENTIAL/PLATELET
Basophils Absolute: 0 10*3/uL (ref 0.0–0.1)
Basophils Relative: 0 % (ref 0–1)
Eosinophils Absolute: 0.1 10*3/uL (ref 0.0–0.7)
Eosinophils Relative: 1 % (ref 0–5)
HCT: 41.5 % (ref 39.0–52.0)
Hemoglobin: 13.6 g/dL (ref 13.0–17.0)
Lymphocytes Relative: 17 % (ref 12–46)
Lymphs Abs: 1.3 10*3/uL (ref 0.7–4.0)
MCH: 29.4 pg (ref 26.0–34.0)
MCHC: 32.8 g/dL (ref 30.0–36.0)
MCV: 89.6 fL (ref 78.0–100.0)
Monocytes Absolute: 0.8 10*3/uL (ref 0.1–1.0)
Monocytes Relative: 11 % (ref 3–12)
Neutro Abs: 5.7 10*3/uL (ref 1.7–7.7)
Neutrophils Relative %: 71 % (ref 43–77)
Platelets: 190 10*3/uL (ref 150–400)
RBC: 4.63 MIL/uL (ref 4.22–5.81)
RDW: 14 % (ref 11.5–15.5)
WBC: 8 10*3/uL (ref 4.0–10.5)

## 2015-02-02 LAB — BASIC METABOLIC PANEL
Anion gap: 11 (ref 5–15)
BUN: 25 mg/dL — ABNORMAL HIGH (ref 6–23)
CO2: 25 mmol/L (ref 19–32)
Calcium: 9.1 mg/dL (ref 8.4–10.5)
Chloride: 106 mmol/L (ref 96–112)
Creatinine, Ser: 1.58 mg/dL — ABNORMAL HIGH (ref 0.50–1.35)
GFR calc Af Amer: 53 mL/min — ABNORMAL LOW (ref >90)
GFR calc non Af Amer: 46 mL/min — ABNORMAL LOW (ref >90)
Glucose, Bld: 218 mg/dL — ABNORMAL HIGH (ref 70–99)
Potassium: 4.5 mmol/L (ref 3.5–5.1)
Sodium: 142 mmol/L (ref 135–145)

## 2015-02-02 LAB — TSH: TSH: 1.933 u[IU]/mL (ref 0.350–4.500)

## 2015-02-02 LAB — MAGNESIUM: Magnesium: 1.6 mg/dL (ref 1.5–2.5)

## 2015-02-02 LAB — TROPONIN I: Troponin I: 0.05 ng/mL — ABNORMAL HIGH (ref <0.031)

## 2015-02-02 LAB — MRSA PCR SCREENING: MRSA by PCR: NEGATIVE

## 2015-02-02 MED ORDER — INSULIN ASPART 100 UNIT/ML ~~LOC~~ SOLN
0.0000 [IU] | SUBCUTANEOUS | Status: DC
  Administered 2015-02-02 – 2015-02-03 (×3): 2 [IU] via SUBCUTANEOUS
  Administered 2015-02-03: 7 [IU] via SUBCUTANEOUS
  Administered 2015-02-03 (×2): 3 [IU] via SUBCUTANEOUS
  Administered 2015-02-03: 2 [IU] via SUBCUTANEOUS

## 2015-02-02 MED ORDER — LEVOTHYROXINE SODIUM 125 MCG PO TABS
125.0000 ug | ORAL_TABLET | Freq: Every day | ORAL | Status: DC
  Administered 2015-02-03 – 2015-02-05 (×3): 125 ug via ORAL
  Filled 2015-02-02 (×5): qty 1

## 2015-02-02 MED ORDER — DILTIAZEM LOAD VIA INFUSION
10.0000 mg | Freq: Once | INTRAVENOUS | Status: AC
  Administered 2015-02-02: 10 mg via INTRAVENOUS
  Filled 2015-02-02: qty 10

## 2015-02-02 MED ORDER — TIOTROPIUM BROMIDE MONOHYDRATE 18 MCG IN CAPS
18.0000 ug | ORAL_CAPSULE | Freq: Every day | RESPIRATORY_TRACT | Status: DC
  Administered 2015-02-03 – 2015-02-05 (×3): 18 ug via RESPIRATORY_TRACT
  Filled 2015-02-02: qty 5

## 2015-02-02 MED ORDER — LEVALBUTEROL HCL 0.63 MG/3ML IN NEBU
0.6300 mg | INHALATION_SOLUTION | Freq: Once | RESPIRATORY_TRACT | Status: DC
  Administered 2015-02-02: 0.63 mg via RESPIRATORY_TRACT
  Filled 2015-02-02: qty 3

## 2015-02-02 MED ORDER — CHLORHEXIDINE GLUCONATE 0.12 % MT SOLN
15.0000 mL | Freq: Two times a day (BID) | OROMUCOSAL | Status: DC
  Administered 2015-02-02 – 2015-02-03 (×2): 15 mL via OROMUCOSAL
  Filled 2015-02-02 (×4): qty 15

## 2015-02-02 MED ORDER — ACETAMINOPHEN 650 MG RE SUPP: 650.0000 mg | Freq: Four times a day (QID) | RECTAL | Status: DC | PRN

## 2015-02-02 MED ORDER — SODIUM CHLORIDE 0.9 % IV SOLN
INTRAVENOUS | Status: DC
Start: 2015-02-02 — End: 2015-02-05

## 2015-02-02 MED ORDER — MOMETASONE FURO-FORMOTEROL FUM 100-5 MCG/ACT IN AERO
2.0000 | INHALATION_SPRAY | Freq: Two times a day (BID) | RESPIRATORY_TRACT | Status: DC
  Administered 2015-02-02 – 2015-02-05 (×6): 2 via RESPIRATORY_TRACT
  Filled 2015-02-02: qty 8.8

## 2015-02-02 MED ORDER — METOPROLOL SUCCINATE ER 100 MG PO TB24
200.0000 mg | ORAL_TABLET | Freq: Every day | ORAL | Status: DC
  Administered 2015-02-03 – 2015-02-05 (×3): 200 mg via ORAL
  Filled 2015-02-02 (×3): qty 2

## 2015-02-02 MED ORDER — ATORVASTATIN CALCIUM 40 MG PO TABS
40.0000 mg | ORAL_TABLET | Freq: Every day | ORAL | Status: DC
  Administered 2015-02-02 – 2015-02-05 (×4): 40 mg via ORAL
  Filled 2015-02-02 (×4): qty 1

## 2015-02-02 MED ORDER — FUROSEMIDE 10 MG/ML IJ SOLN
60.0000 mg | Freq: Once | INTRAMUSCULAR | Status: DC
  Filled 2015-02-02: qty 6

## 2015-02-02 MED ORDER — NITROGLYCERIN IN D5W 200-5 MCG/ML-% IV SOLN
0.0000 ug/min | Freq: Once | INTRAVENOUS | Status: AC
  Administered 2015-02-02: 16.667 ug/min via INTRAVENOUS
  Filled 2015-02-02: qty 250

## 2015-02-02 MED ORDER — ACETAMINOPHEN 325 MG PO TABS: 650.0000 mg | ORAL_TABLET | Freq: Four times a day (QID) | ORAL | Status: DC | PRN

## 2015-02-02 MED ORDER — ALPRAZOLAM 0.5 MG PO TABS
0.5000 mg | ORAL_TABLET | Freq: Three times a day (TID) | ORAL | Status: DC | PRN
Start: 2015-02-02 — End: 2015-02-05
  Administered 2015-02-03 – 2015-02-04 (×2): 0.5 mg via ORAL
  Filled 2015-02-02 (×2): qty 1

## 2015-02-02 MED ORDER — NITROGLYCERIN IN D5W 200-5 MCG/ML-% IV SOLN
0.0000 ug/min | Freq: Once | INTRAVENOUS | Status: AC
Start: 2015-02-02 — End: 2015-02-03
  Administered 2015-02-03: 10 ug/min via INTRAVENOUS

## 2015-02-02 MED ORDER — CETYLPYRIDINIUM CHLORIDE 0.05 % MT LIQD
7.0000 mL | Freq: Two times a day (BID) | OROMUCOSAL | Status: DC
  Administered 2015-02-03: 7 mL via OROMUCOSAL

## 2015-02-02 MED ORDER — ZOLPIDEM TARTRATE 5 MG PO TABS
10.0000 mg | ORAL_TABLET | Freq: Every evening | ORAL | Status: DC | PRN
  Administered 2015-02-02 – 2015-02-04 (×3): 10 mg via ORAL
  Filled 2015-02-02 (×3): qty 2

## 2015-02-02 MED ORDER — CLOPIDOGREL BISULFATE 75 MG PO TABS
75.0000 mg | ORAL_TABLET | Freq: Every day | ORAL | Status: DC
  Administered 2015-02-03 – 2015-02-05 (×3): 75 mg via ORAL
  Filled 2015-02-02 (×4): qty 1

## 2015-02-02 MED ORDER — IPRATROPIUM BROMIDE 0.02 % IN SOLN
0.5000 mg | Freq: Once | RESPIRATORY_TRACT | Status: DC
  Administered 2015-02-02: 0.5 mg via RESPIRATORY_TRACT
  Filled 2015-02-02: qty 2.5

## 2015-02-02 MED ORDER — ONDANSETRON HCL 4 MG/2ML IJ SOLN: 4.0000 mg | Freq: Four times a day (QID) | INTRAMUSCULAR | Status: DC | PRN

## 2015-02-02 MED ORDER — ADENOSINE 6 MG/2ML IV SOLN
INTRAVENOUS | Status: AC
  Administered 2015-02-02: 6 mg
  Filled 2015-02-02: qty 2

## 2015-02-02 MED ORDER — ONDANSETRON HCL 4 MG PO TABS
4.0000 mg | ORAL_TABLET | Freq: Four times a day (QID) | ORAL | Status: DC | PRN
Start: 2015-02-02 — End: 2015-02-05

## 2015-02-02 MED ORDER — ISOSORBIDE MONONITRATE ER 60 MG PO TB24
60.0000 mg | ORAL_TABLET | Freq: Every day | ORAL | Status: DC
  Filled 2015-02-02: qty 1

## 2015-02-02 MED ORDER — DILTIAZEM HCL 100 MG IV SOLR
5.0000 mg/h | INTRAVENOUS | Status: DC
  Administered 2015-02-02: 15 mg/h via INTRAVENOUS
  Administered 2015-02-02: 5 mg/h via INTRAVENOUS
  Filled 2015-02-02 (×3): qty 100

## 2015-02-02 MED ORDER — METOPROLOL TARTRATE 1 MG/ML IV SOLN: 5.0000 mg | Freq: Once | INTRAVENOUS | Status: DC

## 2015-02-02 MED ORDER — ADENOSINE 6 MG/2ML IV SOLN
INTRAVENOUS | Status: AC
  Administered 2015-02-02: 6 mg
  Filled 2015-02-02: qty 6

## 2015-02-02 MED ORDER — TIOTROPIUM BROMIDE MONOHYDRATE 18 MCG IN CAPS: 18.0000 ug | ORAL_CAPSULE | Freq: Every day | RESPIRATORY_TRACT | Status: DC

## 2015-02-02 MED ORDER — LEVALBUTEROL HCL 0.63 MG/3ML IN NEBU: 0.6300 mg | INHALATION_SOLUTION | RESPIRATORY_TRACT | Status: DC | PRN

## 2015-02-02 MED ORDER — HEPARIN SODIUM (PORCINE) 5000 UNIT/ML IJ SOLN
5000.0000 [IU] | Freq: Three times a day (TID) | INTRAMUSCULAR | Status: DC
  Administered 2015-02-02 – 2015-02-05 (×9): 5000 [IU] via SUBCUTANEOUS
  Filled 2015-02-02 (×11): qty 1

## 2015-02-02 MED ORDER — ALBUTEROL SULFATE (2.5 MG/3ML) 0.083% IN NEBU: 2.5000 mg | INHALATION_SOLUTION | RESPIRATORY_TRACT | Status: DC | PRN

## 2015-02-02 MED ORDER — DILTIAZEM HCL 100 MG IV SOLR: 5.0000 mg/h | Freq: Once | INTRAVENOUS | Status: DC

## 2015-02-02 MED ORDER — FUROSEMIDE 10 MG/ML IJ SOLN
80.0000 mg | Freq: Once | INTRAMUSCULAR | Status: AC
  Administered 2015-02-02: 80 mg via INTRAVENOUS
  Filled 2015-02-02: qty 8

## 2015-02-02 MED ORDER — ASPIRIN 81 MG PO TABS
81.0000 mg | ORAL_TABLET | Freq: Every day | ORAL | Status: DC
  Filled 2015-02-02 (×2): qty 1

## 2015-02-02 MED ORDER — AMIODARONE HCL IN DEXTROSE 360-4.14 MG/200ML-% IV SOLN
INTRAVENOUS | Status: AC
  Administered 2015-02-02: 200 mL
  Filled 2015-02-02: qty 200

## 2015-02-02 NOTE — ED Notes (Addendum)
Pt sats at 82% good wave form, MD notified. Pt placed on non-rebreather. Respiratory, Patrick at bedside as well. MD advising to give 10 mg bolus of cardizem stat.

## 2015-02-02 NOTE — ED Notes (Signed)
Respiratory asked to come take a look at the pt. Pt now on 6 L O2 nasal cannula, mouth breathing, not getting much benefit. MD notified.

## 2015-02-02 NOTE — ED Notes (Addendum)
Pt back in SVT at 180. VSS otherwise. Pt encouraged to use valsalva maneuver. Hr down to 92.

## 2015-02-02 NOTE — ED Notes (Signed)
Pt converted back to SVT at this time, Kohut MD at bedside. Remain on amio drip.

## 2015-02-02 NOTE — ED Notes (Signed)
Spoke with pharmacy about getting cardizem drip. Pyxis' here in ED not allowing me to get the drip out. Will send STAT.

## 2015-02-02 NOTE — ED Notes (Addendum)
Pt continuing to have increased periods of elevated HR and V-Tach.  Pulses stable during tachycardia.

## 2015-02-02 NOTE — ED Notes (Signed)
Flow called about "admit to inpatient" order and placement

## 2015-02-02 NOTE — Progress Notes (Signed)
eLink Physician-Brief Progress Note Patient Name: Jerry NewtonRobert S Montgomery DOB: 11/23/1955 MRN: [id number]   Date of Service  02/02/2015  HPI/Events of Note  Called d/t multiple issues: 1. Blood glucose = 212 and 2. Patient needs Nitroglycerin IV infusion reorder. Patient is also on Imdur.   eICU Interventions  Will order: 1. Novolog sensitive scale insulin and Q 4 hour blood glucose checks. 2. Nitroglycerin IV infusion reordered.  3. D/C Imdur for now.      Intervention Category Intermediate Interventions: Hyperglycemia - evaluation and treatment Minor Interventions: Routine modifications to care plan (e.g. PRN medications for pain, fever)  Jerry Montgomery 02/02/2015, 8:17 PM

## 2015-02-02 NOTE — ED Notes (Signed)
Cards at bedside assessing pt

## 2015-02-02 NOTE — ED Notes (Signed)
Pt denies chest pain, noticed increasing sob (more than normal) yesterday along with dull pain around left shoulder blade. Pt sob while resting.

## 2015-02-02 NOTE — ED Notes (Addendum)
Critical care at bedside. Recommending venturi mask at 50%

## 2015-02-02 NOTE — ED Notes (Signed)
Amiodarone started, bolus of 150 mg to be given over 10 minutes. Then drip at 33 ml/hr.

## 2015-02-02 NOTE — ED Notes (Signed)
RN on floor to have additional RN call this RN back for further report on pt

## 2015-02-02 NOTE — ED Notes (Addendum)
Pt has suddenly gone into Vtach at rate of 180. Pt remains alert and oriented x4. Denies pain. Pulses present and strong. Diaphoretic, appears more sob. BP stable. md kohut at bedside.

## 2015-02-02 NOTE — ED Notes (Addendum)
Pt rhythm did not convert, rate remains at 198 at this time. Pt alert and oriented. No changes from baseline on arrival.

## 2015-02-02 NOTE — ED Notes (Signed)
Respiratory therapy at bedside and critical care to apply bipap

## 2015-02-02 NOTE — ED Notes (Signed)
Per EMS- pt was being evaluated at PCP today for sob, ECG was completed and per PCP it was different that previous ECG's; upon arrival to scene pt was in irregular rhythm at rate of 84 and spontaneously went into SVT at 180's. 2 rounds of adenosine, first round 6 mg, second 12 mg. Pt converted back to NSR at 80's. Pt 94% on 4L, normally wears 4 L at home due to hx of pulmonary fibrosis. Pt alert and oriented x4.

## 2015-02-02 NOTE — ED Notes (Signed)
Per Cardiology, d/c amio drip and begin cardizem.

## 2015-02-02 NOTE — ED Notes (Signed)
Pushing adenosine at this time 12 mg.

## 2015-02-02 NOTE — ED Notes (Addendum)
Report attempted x 1

## 2015-02-02 NOTE — Progress Notes (Signed)
Subjective:    Patient ID: Jerry Montgomery, male    DOB: 04/06/1956, 59 y.o.   MRN: [id number]  HPI She is a 59 year old white male with a history of coronary artery disease, severe pulmonary fibrosis, hypertension, hyperlipidemia, and diabetes mellitus type 2 who presents with the sudden onset of severe shortness of breath. His daughter states that his pulse oximetry at home has been as low as 60% in the mornings before he starts his oxygen. He is normally on 3 L of oxygen at home. Today on 3 L he is 88% on room air. He does complain of some pain underneath his left shoulder blade. He denies any chest pain. He denies any chest pressure. However he can only walk approximately 30-40 feet without stopping to rest. This is a sudden change for this patient. EKG today shows an abnormal rhythm which is irregularly irregular. I do not see any definite P waves. The patient appears to be in atrial fibrillation. Furthermore there is ST segment depressions in lead 1 and aVL concerning for lateral ischemia. There are chronic Q waves in the inferior leads which is old. Past Medical History  Diagnosis Date  . Diabetes mellitus   . Hypertension   . Hyperlipidemia   . Postinflammatory pulmonary fibrosis   . CAD (coronary artery disease)   . Pneumothorax on right 4/11  . Shortness of breath     with exertion   . Sleep apnea     does not use CPAP   . COPD (chronic obstructive pulmonary disease)    Past Surgical History  Procedure Laterality Date  . Coronary stent placement  03/05/2003    PCI & Stent  LAD & mid CX  . Elbow surgery    . Rotator cuff repair    . Bilateral vats ablation      rt  . Cardiac catheterization  05/03/2007    patent stents  . Right knee surgery       2012   . Knee arthroscopy Right 01/22/2013    Procedure: RIGHT ARTHROSCOPY KNEE WITH DEBRIDEMENT, abrasion chondroplasty of lateral tibial plateau, debridement of partial tear of ACL, menisectomy;  Surgeon: Jacki Conesonald A Gioffre, MD;   Location: [facility];  Service: Orthopedics;  Laterality: Right;  . Coronary angioplasty with stent placement  01/13/2010    PCI & stent to CX   Current Outpatient Prescriptions on File Prior to Visit  Medication Sig Dispense Refill  . ACCU-CHEK SOFTCLIX LANCETS lancets USE AS INSTRUCTED 100 each 1  . ADVAIR HFA 115-21 MCG/ACT inhaler Inhale 2 puffs into the lungs 2 (two) times daily.    Marland Kitchen. albuterol (PROVENTIL HFA;VENTOLIN HFA) 108 (90 BASE) MCG/ACT inhaler Inhale 2 puffs into the lungs every 6 (six) hours as needed for wheezing. 1 Inhaler 0  . ALPRAZolam (XANAX) 0.5 MG tablet Take 1 tablet (0.5 mg total) by mouth 3 (three) times daily as needed for anxiety. 30 tablet 0  . aspirin 81 MG tablet Take 81 mg by mouth daily.    Marland Kitchen. atorvastatin (LIPITOR) 40 MG tablet Take 1 tablet by mouth  every night at bedtime 90 tablet 3  . B-D ULTRAFINE III SHORT PEN 31G X 8 MM MISC Use as directed 240 each 3  . clopidogrel (PLAVIX) 75 MG tablet Take 1 tablet by mouth  every day 90 tablet 3  . fluticasone-salmeterol (ADVAIR HFA) 115-21 MCG/ACT inhaler Inhale 2 puffs into the lungs.    . furosemide (LASIX) 20 MG tablet Take 20 mg by  mouth daily as needed (fluid).     Marland Kitchen glucose blood (ACCU-CHEK AVIVA PLUS) test strip Check bs 4-5 times per day DX-250.00 100 each 11  . glyBURIDE (DIABETA) 5 MG tablet Take 1 tablet by mouth  twice a day 180 tablet 3  . insulin lispro (HUMALOG) 100 UNIT/ML KiwkPen Inject 10units into the skin with breakfast and lunch, and inject 20units into the skin with dinner. 30 mL 11  . isosorbide mononitrate (IMDUR) 60 MG 24 hr tablet Take 1 and 1/2 tablets by  mouth every day 135 tablet 1  . LANTUS SOLOSTAR 100 UNIT/ML Solostar Pen Inject subcutaneously 65  units every night at  bedtime 60 mL 3  . levothyroxine (SYNTHROID, LEVOTHROID) 100 MCG tablet Take 1 tablet by mouth  daily 90 tablet 2  . levothyroxine (SYNTHROID, LEVOTHROID) 125 MCG tablet Take 1 tablet (125 mcg total) by mouth daily. 90  tablet 3  . lidocaine (LIDODERM) 5 % Place 1 patch onto the skin daily.    Marland Kitchen linagliptin (TRADJENTA) 5 MG TABS tablet Take 1 tablet (5 mg total) by mouth daily. 90 tablet 3  . losartan (COZAAR) 100 MG tablet Take 1 tablet by mouth  every day 90 tablet 3  . metFORMIN (GLUCOPHAGE) 850 MG tablet Take 1 tablet by mouth  twice a day 180 tablet 3  . metoprolol (TOPROL-XL) 200 MG 24 hr tablet Take one-half tablet by  mouth every day 45 tablet 1  . niacin (NIASPAN) 1000 MG CR tablet Take 1 tablet by mouth  every day 90 tablet 3  . nitroGLYCERIN (NITROSTAT) 0.4 MG SL tablet Place 0.4 mg under the tongue every 5 (five) minutes as needed for chest pain.     Marland Kitchen SPIRIVA HANDIHALER 18 MCG inhalation capsule Place 1 capsule into inhaler and inhale daily.    . SYRINGE/NEEDLE, DISP, 1 ML (B-D SYRINGE/NEEDLE 1CC/25GX5/8) 25G X 5/8" 1 ML MISC As directed 100 each 5  . tiotropium (SPIRIVA) 18 MCG inhalation capsule Place 1 capsule into inhaler and inhale.    . zolpidem (AMBIEN) 10 MG tablet Take 1 tablet (10 mg total) by mouth at bedtime as needed. 30 tablet 2   No current facility-administered medications on file prior to visit.   Allergies  Allergen Reactions  . Altace [Ramipril]     Cough    History   Social History  . Marital Status: Divorced    Spouse Name: N/A  . Number of Children: 1  . Years of Education: N/A   Occupational History  .  [employer]   Social History Main Topics  . Smoking status: Former Smoker -- 1.50 packs/day for 35 years    Types: Cigarettes    Quit date: 10/24/2007  . Smokeless tobacco: Never Used  . Alcohol Use: Yes     Comment: social  . Drug Use: No  . Sexual Activity: Not on file   Other Topics Concern  . Not on file   Social History Narrative      Review of Systems  All other systems reviewed and are negative.      Objective:   Physical Exam  Constitutional: He appears well-developed and well-nourished.  Neck: No JVD present.  Cardiovascular:  Normal rate.  An irregularly irregular rhythm present.  Pulmonary/Chest: Accessory muscle usage present. Tachypnea noted. He has decreased breath sounds. He has no wheezes. He has no rhonchi.  Abdominal: Soft. Bowel sounds are normal.  Musculoskeletal: He exhibits no edema.  Vitals reviewed.  Assessment & Plan:  SOB (shortness of breath) - Plan: EKG 12-Lead  Patient has accessory muscle use. He is hypoxic even on his baseline 3 L for his pulmonary fibrosis. Certainly his shortness of breath to be multifactorial and related to the pulmonary fibrosis.   However, given his numerous risk factors including coronary artery disease, hypertension, hyperlipidemia as well as his EKG changes concerning for new onset atrial fibrillation with lateral ischemia, I believe the patient needs EMS transport to the emergency room for evaluation for acute coronary syndrome. I explained this to the patient. He'll be transported to the emergency room immediately for further evaluation.

## 2015-02-02 NOTE — ED Notes (Signed)
Pushing 12 mg of adenosine for a second time.

## 2015-02-02 NOTE — ED Provider Notes (Signed)
CSN: [id number]     Arrival date & time 02/02/15  1240 History   First MD Initiated Contact with Patient 02/02/15 1309     Chief Complaint  Patient presents with  . Irregular Heart Beat     (Consider location/radiation/quality/duration/timing/severity/associated sxs/prior Treatment) HPI    59 year old male referred from PCPs office for further evaluation of shortness of breath and dysrhythmia. He has a past history known coronary artery disease, severe pulmonary fibrosis on 4 L of oxygen chronically, type 2 diabetes, hyperlipidemia and hypertension. She has baseline dyspnea, but over the past day this has been steadily worsened. Markedly decreased exercise tolerance. Family noted pulse oximetry in the 60%. Patient has been having some pain since yesterday in his left axillary region/left shoulder blade. No appreciable exacerbating relieving factors with regards to this. In the office he apparently seemed to be in atrial fibrillation which he does not have a diagnosed history of. He was referred to the emergency room for further evaluation. Denies any history of DVT or pulmonary embolism. No unusual leg pain or swelling.   Past Medical History  Diagnosis Date  . Diabetes mellitus   . Hypertension   . Hyperlipidemia   . Postinflammatory pulmonary fibrosis   . CAD (coronary artery disease)   . Pneumothorax on right 4/11  . Shortness of breath     with exertion   . Sleep apnea     does not use CPAP   . COPD (chronic obstructive pulmonary disease)    Past Surgical History  Procedure Laterality Date  . Coronary stent placement  03/05/2003    PCI & Stent  LAD & mid CX  . Elbow surgery    . Rotator cuff repair    . Bilateral vats ablation      rt  . Cardiac catheterization  05/03/2007    patent stents  . Right knee surgery       2012   . Knee arthroscopy Right 01/22/2013    Procedure: RIGHT ARTHROSCOPY KNEE WITH DEBRIDEMENT, abrasion chondroplasty of lateral tibial plateau, debridement  of partial tear of ACL, menisectomy;  Surgeon: Jacki Conesonald A Gioffre, MD;  Location: [facility];  Service: Orthopedics;  Laterality: Right;  . Coronary angioplasty with stent placement  01/13/2010    PCI & stent to CX   Family History  Problem Relation Age of Onset  . Breast cancer Mother   . Lung cancer Father   . Diabetes type II Mother    History  Substance Use Topics  . Smoking status: Former Smoker -- 1.50 packs/day for 35 years    Types: Cigarettes    Quit date: 10/24/2007  . Smokeless tobacco: Never Used  . Alcohol Use: Yes     Comment: social    Review of Systems  All systems reviewed and negative, other than as noted in HPI.   Allergies  Altace  Home Medications   Prior to Admission medications   Medication Sig Start Date End Date Taking? Authorizing Provider  ACCU-CHEK SOFTCLIX LANCETS lancets USE AS INSTRUCTED 07/14/14   Donita BrooksWarren T Pickard, MD  [facility]/ACT inhaler Inhale 2 puffs into the lungs 2 (two) times daily. 08/03/13   Historical Provider, MD  albuterol (PROVENTIL HFA;VENTOLIN HFA) 108 (90 BASE) MCG/ACT inhaler Inhale 2 puffs into the lungs every 6 (six) hours as needed for wheezing. 06/05/13   Donita BrooksWarren T Pickard, MD  ALPRAZolam Prudy Feeler(XANAX) 0.5 MG tablet Take 1 tablet (0.5 mg total) by mouth 3 (three) times daily as needed  for anxiety. 04/17/14   Donita Brooks, MD  aspirin 81 MG tablet Take 81 mg by mouth daily.    Historical Provider, MD  atorvastatin (LIPITOR) 40 MG tablet Take 1 tablet by mouth  every night at bedtime 02/18/14   Donita Brooks, MD  B-D ULTRAFINE III SHORT PEN 31G X 8 MM MISC Use as directed    Donita Brooks, MD  clopidogrel (PLAVIX) 75 MG tablet Take 1 tablet by mouth  every day 02/18/14   Donita Brooks, MD  fluticasone-salmeterol (ADVAIR HFA) [phone] MCG/ACT inhaler Inhale 2 puffs into the lungs. 07/29/14   Historical Provider, MD  furosemide (LASIX) 20 MG tablet Take 20 mg by mouth daily as needed (fluid).  02/23/12   Historical Provider,  MD  glucose blood (ACCU-CHEK AVIVA PLUS) test strip Check bs 4-5 times per day DX-250.00    Donita Brooks, MD  glyBURIDE (DIABETA) 5 MG tablet Take 1 tablet by mouth  twice a day 02/18/14   Donita Brooks, MD  insulin lispro (HUMALOG) 100 UNIT/ML KiwkPen Inject 10units into the skin with breakfast and lunch, and inject 20units into the skin with dinner. 01/15/15   Donita Brooks, MD  isosorbide mononitrate (IMDUR) 60 MG 24 hr tablet Take 1 and 1/2 tablets by  mouth every day 07/03/14   Donita Brooks, MD  LANTUS SOLOSTAR 100 UNIT/ML Solostar Pen Inject subcutaneously 65  units every night at  bedtime 12/19/13   Donita Brooks, MD  levothyroxine (SYNTHROID, LEVOTHROID) 100 MCG tablet Take 1 tablet by mouth  daily 07/09/14   Donita Brooks, MD  levothyroxine (SYNTHROID, LEVOTHROID) 125 MCG tablet Take 1 tablet (125 mcg total) by mouth daily. 01/12/15   Donita Brooks, MD  linagliptin (TRADJENTA) 5 MG TABS tablet Take 1 tablet (5 mg total) by mouth daily. 07/09/14   Donita Brooks, MD  losartan (COZAAR) 100 MG tablet Take 1 tablet by mouth  every day 02/18/14   Donita Brooks, MD  metFORMIN (GLUCOPHAGE) 850 MG tablet Take 1 tablet by mouth  twice a day 04/14/14   Donita Brooks, MD  metoprolol (TOPROL-XL) 200 MG 24 hr tablet Take one-half tablet by  mouth every day 07/03/14   Donita Brooks, MD  niacin (NIASPAN) 1000 MG CR tablet Take 1 tablet by mouth  every day 05/06/13   Donita Brooks, MD  nitroGLYCERIN (NITROSTAT) 0.4 MG SL tablet Place 0.4 mg under the tongue every 5 (five) minutes as needed for chest pain.     Historical Provider, MD  SPIRIVA HANDIHALER 18 MCG inhalation capsule Place 1 capsule into inhaler and inhale daily. 08/03/13   Historical Provider, MD  SYRINGE/NEEDLE, DISP, 1 ML (B-D SYRINGE/NEEDLE 1CC/25GX5/8) 25G X 5/8" 1 ML MISC As directed 11/10/13   Donita Brooks, MD  tiotropium (SPIRIVA) 18 MCG inhalation capsule Place 1 capsule into inhaler and inhale. 07/29/14    Historical Provider, MD  zolpidem (AMBIEN) 10 MG tablet Take 1 tablet (10 mg total) by mouth at bedtime as needed. 01/12/15   Donita Brooks, MD   BP 107/92 mmHg  Pulse 74  Temp(Src) 98.8 F (37.1 C) (Oral)  Resp 32  SpO2 94% Physical Exam  Constitutional: He appears well-developed and well-nourished. No distress.  HENT:  Head: Normocephalic and atraumatic.  Eyes: Conjunctivae are normal. Right eye exhibits no discharge. Left eye exhibits no discharge.  Neck: Neck supple.  Cardiovascular: Regular rhythm and normal heart sounds.  Exam  reveals no gallop and no friction rub.   No murmur heard. Tachy. Irregularly irregular  Pulmonary/Chest: He is in respiratory distress. He has wheezes.  Tachypnea. B/l crackles  Abdominal: Soft. He exhibits no distension. There is no tenderness.  Musculoskeletal: He exhibits edema. He exhibits no tenderness.  Mild pitting LE edema  Neurological: He is alert.  Skin: Skin is warm and dry.  Psychiatric: He has a normal mood and affect. His behavior is normal. Thought content normal.  Nursing note and vitals reviewed.   ED Course  Procedures (including critical care time)  CRITICAL CARE Performed by: Raeford Razor   Total critical care time: 35 minutes  Critical care time was exclusive of separately billable procedures and treating other patients. Critical care was necessary to treat or prevent imminent or life-threatening deterioration. Critical care was time spent personally by me on the following activities: development of treatment plan with patient and/or surrogate as well as nursing, discussions with consultants, evaluation of patient's response to treatment, examination of patient, obtaining history from patient or surrogate, ordering and performing treatments and interventions, ordering and review of laboratory studies, ordering and review of radiographic studies, pulse oximetry and re-evaluation of patient's condition.   Labs Review Labs  Reviewed  TROPONIN I - Abnormal; Notable for the following:    Troponin I 0.05 (*)    All other components within normal limits  BASIC METABOLIC PANEL - Abnormal; Notable for the following:    Glucose, Bld 218 (*)    BUN 25 (*)    Creatinine, Ser 1.58 (*)    GFR calc non Af Amer 46 (*)    GFR calc Af Amer 53 (*)    All other components within normal limits  CBC WITH DIFFERENTIAL/PLATELET  MAGNESIUM    Imaging Review No results found.   EKG Interpretation None         MDM   Final diagnoses:  Atrial fibrillation with RVR  Acute heart failure, unspecified heart failure type  Acute respiratory failure with hypoxia    59yM with dyspnea and L sided CP. Seen in PCP's office earlier and referred to ED for further evaluation of tachydysrhythmia. Called to bedside shortly after he arrived. Pt with regular, narrow complex tachycardia with rate of ~185. Given adenosine x2. Converted to what appeared to be sinus rhythm with a lot of ectopy. Shortly later developed wide complex tachycardia with rate in 180s. VTach versus SVT with abberancy. Pt with no new complaints during this though. Mentation fine. Not hypotensive. Amiodarone started. Cardiology consulted.   Cards evaluated patient. Suspects SVT with abberancy. Particularly with underlying lung disease, recommending stopping amiodarone and starting cardizem.   Respiratory status continuing to decline. Will start on BiPAP. CXR w/edema. Likely acute HF precipitated sustained increased rates.  BP remains significantly elevated but HR improving. Nitroglycerin gtt. Lasix.      Raeford Razor, MD 02/02/15 [phone]

## 2015-02-02 NOTE — ED Notes (Signed)
Daughter at bedside. Pt alert and oriented x4. VSS stable after two rounds of adenosine, hr at 96, bp at 107/92. sats at 98% on 4L, what he usually wears at home.

## 2015-02-02 NOTE — H&P (Signed)
PULMONARY / CRITICAL CARE MEDICINE   Name: Jerry Montgomery MRN: [id number] DOB: [birth year]    ADMISSION DATE:  02/02/2015  REFERRING MD :  Dr. Juleen China   CHIEF COMPLAINT:  Hypoxic Respiratory Failure   INITIAL PRESENTATION:  59 y/o M, smoker, with PMH of morbid obesity, DM, CAD s/p stent, HTN, HLD, hypothyroidism. Atrial tachycardia, OSA, chronic respiratory failure in on the basis of severe pulmonary fibrosis and COPD. Admitted on 4/12 w/ 4 day h/o increased dyspnea and fatigue. On presentation found to be hypoxic w/ O2 sats in 60s on 4 liters and HR in 170-180s. PCCM asked to admit  STUDIES:    SIGNIFICANT EVENTS:    HISTORY OF PRESENT ILLNESS:   59 y/o M, smoker, with PMH of morbid obesity, DM, CAD s/p stent, HTN, HLD, hypothyroidism. Atrial tachycardia, OSA, severe pulmonary fibrosis and COPD who presented to [facility] ER on 4/12 with a 4 day history of shortness of breath.  Reports was in usual state of health until the am of 4/7. He notes that he awoke that AM short of breath and more fatigued than baseline. He denied sick exposure, URI symptoms such as nasal gtt, sore throat, HA, denied cough worse than baseline, chest pain, wheeze, fever or chills. Over the next few days he just sat around the house trying not to exert himself and hoping that his dyspnea and fatigue would pass. He denied significant LE edema, palpitations or orthopnea. After no improvement in these symptoms his daughter finally convinced him to go to his PCPs. On eval his O2 sats were in 60s and HR in 170-180s so he was sent to the ER. On evaluation he was noted to be in intermittent wide complex then narrow complex AF. Cardiology was consulted and PCCM asked to admit due to his high FIO2 needs.   PAST MEDICAL HISTORY :   has a past medical history of Diabetes mellitus; Hypertension; Hyperlipidemia; Postinflammatory pulmonary fibrosis; CAD (coronary artery disease); Pneumothorax on right (4/11); Sleep apnea; COPD  (chronic obstructive pulmonary disease); and Atrial tachycardia.  has past surgical history that includes Coronary stent placement (03/05/2003); Elbow surgery; Rotator cuff repair; Bilateral VATS ablation; Cardiac catheterization (05/03/2007); right knee surgery ; Knee arthroscopy (Right, 01/22/2013); and Coronary angioplasty with stent (01/13/2010).   Prior to Admission medications   Medication Sig Start Date End Date Taking? Authorizing Provider  ACCU-CHEK SOFTCLIX LANCETS lancets USE AS INSTRUCTED 07/14/14  Yes Donita Brooks, MD  ADVAIR [facility] 115-21 MCG/ACT inhaler Inhale 2 puffs into the lungs 2 (two) times daily. 08/03/13  Yes Historical Provider, MD  albuterol (PROVENTIL HFA;VENTOLIN HFA) 108 (90 BASE) MCG/ACT inhaler Inhale 2 puffs into the lungs every 6 (six) hours as needed for wheezing. 06/05/13  Yes Donita Brooks, MD  ALPRAZolam Prudy Feeler) 0.5 MG tablet Take 1 tablet (0.5 mg total) by mouth 3 (three) times daily as needed for anxiety. 04/17/14  Yes Donita Brooks, MD  aspirin 81 MG tablet Take 81 mg by mouth daily.   Yes Historical Provider, MD  atorvastatin (LIPITOR) 40 MG tablet Take 1 tablet by mouth  every night at bedtime 02/18/14  Yes Donita Brooks, MD  B-D ULTRAFINE III SHORT PEN 31G X 8 MM MISC Use as directed   Yes Donita Brooks, MD  clopidogrel (PLAVIX) 75 MG tablet Take 1 tablet by mouth  every day 02/18/14  Yes Donita Brooks, MD  furosemide (LASIX) 20 MG tablet Take 20 mg by mouth daily as needed (fluid).  02/23/12  Yes Historical Provider, MD  glucose blood (ACCU-CHEK AVIVA PLUS) test strip Check bs 4-5 times per day DX-250.00   Yes Donita BrooksWarren T Pickard, MD  insulin lispro (HUMALOG) 100 UNIT/ML KiwkPen Inject 10units into the skin with breakfast and lunch, and inject 20units into the skin with dinner. 01/15/15  Yes Donita BrooksWarren T Pickard, MD  isosorbide mononitrate (IMDUR) 60 MG 24 hr tablet Take 1 and 1/2 tablets by  mouth every day 07/03/14  Yes Donita BrooksWarren T Pickard, MD  LANTUS SOLOSTAR  100 UNIT/ML Solostar Pen Inject subcutaneously 65  units every night at  bedtime 12/19/13  Yes Donita BrooksWarren T Pickard, MD  levothyroxine (SYNTHROID, LEVOTHROID) 125 MCG tablet Take 1 tablet (125 mcg total) by mouth daily. 01/12/15  Yes Donita BrooksWarren T Pickard, MD  linagliptin (TRADJENTA) 5 MG TABS tablet Take 1 tablet (5 mg total) by mouth daily. 07/09/14  Yes Donita BrooksWarren T Pickard, MD  losartan (COZAAR) 100 MG tablet Take 1 tablet by mouth  every day 02/18/14  Yes Donita BrooksWarren T Pickard, MD  metFORMIN (GLUCOPHAGE) 850 MG tablet Take 1 tablet by mouth  twice a day 04/14/14  Yes Donita BrooksWarren T Pickard, MD  metoprolol (TOPROL-XL) 200 MG 24 hr tablet Take one-half tablet by  mouth every day 07/03/14  Yes Donita BrooksWarren T Pickard, MD  nitroGLYCERIN (NITROSTAT) 0.4 MG SL tablet Place 0.4 mg under the tongue every 5 (five) minutes as needed for chest pain.    Yes Historical Provider, MD  SYRINGE/NEEDLE, DISP, 1 ML (B-D SYRINGE/NEEDLE 1CC/25GX5/8) 25G X 5/8" 1 ML MISC As directed 11/10/13  Yes Donita BrooksWarren T Pickard, MD  tiotropium (SPIRIVA) 18 MCG inhalation capsule Place 1 capsule into inhaler and inhale daily.  07/29/14  Yes Historical Provider, MD  zolpidem (AMBIEN) 10 MG tablet Take 1 tablet (10 mg total) by mouth at bedtime as needed. 01/12/15  Yes Donita BrooksWarren T Pickard, MD  glyBURIDE (DIABETA) 5 MG tablet Take 1 tablet by mouth  twice a day Patient not taking: Reported on 02/02/2015 02/18/14   Donita BrooksWarren T Pickard, MD  levothyroxine (SYNTHROID, LEVOTHROID) 100 MCG tablet Take 1 tablet by mouth  daily Patient not taking: Reported on 02/02/2015 07/09/14   Donita BrooksWarren T Pickard, MD  niacin (NIASPAN) 1000 MG CR tablet Take 1 tablet by mouth  every day Patient not taking: Reported on 02/02/2015 05/06/13   Donita BrooksWarren T Pickard, MD   Allergies  Allergen Reactions  . Altace [Ramipril]     Cough     FAMILY HISTORY:  indicated that his mother is deceased. He indicated that his father is deceased. He indicated that his maternal grandmother is deceased. He indicated that his  maternal grandfather is deceased. He indicated that his paternal grandmother is deceased. He indicated that his paternal grandfather is deceased.    SOCIAL HISTORY:  reports that he quit smoking about 7 years ago. His smoking use included Cigarettes. He has a 52.5 pack-year smoking history. He has never used smokeless tobacco. He reports that he drinks alcohol. He reports that he does not use illicit drugs.  REVIEW OF SYSTEMS:   Gen: Denies fever, chills, weight change, + fatigue, night sweats HEENT: Denies blurred vision, double vision, hearing loss, tinnitus, sinus congestion, rhinorrhea, sore throat, neck stiffness, dysphagia PULM: + shortness of breath, cough, sputum production, hemoptysis, wheezing CV: Denies chest pain, edema, orthopnea, paroxysmal nocturnal dyspnea, palpitations GI: Denies abdominal pain, nausea, vomiting, diarrhea, hematochezia, melena, constipation, change in bowel habits GU: Denies dysuria, hematuria, polyuria, oliguria, urethral discharge Endocrine: Denies hot or cold  intolerance, polyuria, polyphagia or appetite change Derm: Denies rash, dry skin, scaling or peeling skin change Heme: Denies easy bruising, bleeding, bleeding gums Neuro: Denies headache, numbness, weakness, slurred speech, loss of memory or consciousness  SUBJECTIVE:  Marked dyspnea   VITAL SIGNS: Temp:  [97.5 F (36.4 C)-98.8 F (37.1 C)] 98.8 F (37.1 C) (04/12 1250) Pulse Rate:  [74-188] 176 (04/12 1430) Resp:  [24-33] 31 (04/12 1430) BP: (107-164)/(64-123) 144/103 mmHg (04/12 1430) SpO2:  [91 %-97 %] 92 % (04/12 1430) Weight:  [267 lb (121.11 kg)] 267 lb (121.11 kg) (04/12 1102)   HEMODYNAMICS:     VENTILATOR SETTINGS:     INTAKE / OUTPUT: No intake or output data in the 24 hours ending 02/02/15 1523  PHYSICAL EXAMINATION: General:  Well nourished 59 year old white male. Currently w/ marked respiratory distress on High FIO2 Neuro:  Awake, oriented, no focal def  HEENT:  [place]/AT,  no JVD, MMM Cardiovascular:  Tachy irreg  Lungs:  + accessory muscle use. Exp wheeze and posterior rales  Abdomen:  Soft, non-tender + bowel sounds  Musculoskeletal:  Intact  Skin:  LE edema   LABS:  CBC  Recent Labs Lab 02/02/15 1250  WBC 8.0  HGB 13.6  HCT 41.5  PLT 190   Coag's No results for input(s): APTT, INR in the last 168 hours.   BMET  Recent Labs Lab 02/02/15 1250  NA 142  K 4.5  CL 106  CO2 25  BUN 25*  CREATININE 1.58*  GLUCOSE 218*   Electrolytes  Recent Labs Lab 02/02/15 1250  CALCIUM 9.1  MG 1.6   Sepsis Markers No results for input(s): LATICACIDVEN, PROCALCITON, O2SATVEN in the last 168 hours.   ABG No results for input(s): PHART, PCO2ART, PO2ART in the last 168 hours.   Liver Enzymes No results for input(s): AST, ALT, ALKPHOS, BILITOT, ALBUMIN in the last 168 hours.   Cardiac Enzymes  Recent Labs Lab 02/02/15 1250  TROPONINI 0.05*   Glucose No results for input(s): GLUCAP in the last 168 hours.  Imaging No results found.   ASSESSMENT / PLAN:  PULMONARY OETT A: Acute on chronic Hypoxic Respiratory Failure: suspect element of edema exacerbated by AF and decompensated HF superimposed on ILD  Pulmonary Fibrosis f/b [facility]. Failed Pifidone   COPD: not currently in exacerbation  P:   Admit to ICU BIPAP PRN; O2 as needed Rate control Lasix  Scheduled BDs  CARDIOVASCULAR CVL A:  SVT/Atrial Tachycardia with Aberrant Conduction   Hx Atrial Tachycardia  Grade 2 diastolic dysfunction  CAD P:  CCB gtt per cards Avoid amio given ILD Consider ECHO-->defer to cards.  Admit to ICU Avoid hypoxia  He is on ASA; will need to decide on long term anticoagulation.   RENAL A:   Acute Kidney Injury P:   Maximize Cardiac output w/ rate control Renal dose meds F/u chemistry   GASTROINTESTINAL A:   Morbid Obesity  P:   PPI NPO for now   HEMATOLOGIC A:  No acute P:  Trend CBC  INFECTIOUS A:  No acute issue  P:    Trend CBC  ENDOCRINE A:   Diabetes Mellitus  Hypothyroidism   P:   ssi  Ck TSH   NEUROLOGIC A:  No acute  P:   Supportive care    FAMILY  - Updates: daughter updated at bedside.    Canary Brim, NP-C [facility] Pgr: [phone] or [phone]  Patient seen and examined, history obtained,  SOB x4 days that has been worsening.  Was in PCPs office and was noted to be desaturating in the 70s with increased work of breathing.  Brought to the ED for evaluation, noted to be in a-fib with RVR.  Chest exam with diffuse crackles.  Has history of IPF.  Patient was placed on BiPAP for respiratory support as his work of breathing was worsening as I was interviewing him.  Will place on BiPAP.  BD as ordered.  Cardizem with titration ordered.  No need for abx.  Will admit to the ICU.  Cardiology consulted.  The patient is critically ill with multiple organ systems failure and requires high complexity decision making for assessment and support, frequent evaluation and titration of therapies, application of advanced monitoring technologies and extensive interpretation of multiple databases.   Critical Care Time devoted to patient care services described in this note is  45  Minutes. This time reflects time of care of this signee Dr Koren Bound. This critical care time does not reflect procedure time, or teaching time or supervisory time of PA/NP/Med student/Med Resident etc but could involve care discussion time.  Alyson Reedy, M.D. [facility] Medicine. Pager: [phone]. After hours pager: [phone].  02/02/2015, 3:23 PM

## 2015-02-02 NOTE — Consult Note (Signed)
ELECTROPHYSIOLOGY CONSULT NOTE    Patient ID: Jerry NewtonRobert S Montgomery MRN: [id number], DOB/AGE: 62/02/1956 59 y.o.  Admit date: 02/02/2015 Date of Consult: 02/02/2015  Primary Physician: [name],[name], MD Primary Cardiologist: Tresa EndoKelly  Reason for Consultation: atrial tachycardia  HPI:  Jerry NewtonRobert S Montgomery is a 59 y.o. male with a past medical history significant for CAD (s/p LAD and prox circ stenting), hypertension, hyperlipidemia, type II diabetes, and pulmonary fibrosis (on home O2).  He was seen in his PCP's office earlier today with increased shortness of breath and frequent atrial ectopy concerning for AF on EKG.  EMS was called for [facility] and he has had recurrent tachycardia terminated by adenosine.  He has been placed on IV amiodarone.  EP has been asked to evaluate for treatment options.    Last cath was 2011 at which time he underwent circ stent placement.  Last echo 05/2014 demonstrated EF 55-60%, grade 2 diastolic dysfunction, LA 45, PA pressure 48.  Lab work is pending.   He reports significantly increased shortness of breath for the last 5 days with increased home O2 requirements. He has also had increased fatigue. He denies chest pain, palpitations, recent fevers, chills, nausea, vomiting diarrhea. He states that he has occasionally had tachycardia identified on home O2 monitor but these episodes are usually self-terminating and asymptomatic.   Past Medical History  Diagnosis Date  . Diabetes mellitus   . Hypertension   . Hyperlipidemia   . Postinflammatory pulmonary fibrosis   . CAD (coronary artery disease)   . Pneumothorax on right 4/11  . Sleep apnea     does not use CPAP   . COPD (chronic obstructive pulmonary disease)   . Atrial tachycardia      Surgical History:  Past Surgical History  Procedure Laterality Date  . Coronary stent placement  03/05/2003    PCI & Stent  LAD & mid CX  . Elbow surgery    . Rotator cuff repair    . Bilateral vats ablation     rt  . Cardiac catheterization  05/03/2007    patent stents  . Right knee surgery       2012   . Knee arthroscopy Right 01/22/2013    Procedure: RIGHT ARTHROSCOPY KNEE WITH DEBRIDEMENT, abrasion chondroplasty of lateral tibial plateau, debridement of partial tear of ACL, menisectomy;  Surgeon: Jacki Conesonald A Gioffre, MD;  Location: [facility];  Service: Orthopedics;  Laterality: Right;  . Coronary angioplasty with stent placement  01/13/2010    PCI & stent to CX     Current facility-administered medications:  .  diltiazem (CARDIZEM) 1 mg/mL load via infusion 10 mg, 10 mg, Intravenous, Once **AND** diltiazem (CARDIZEM) 100 mg in dextrose 5 % 100 mL (1 mg/mL) infusion, 5-15 mg/hr, Intravenous, Continuous, Hillis RangeJames Craven Crean, MD, Last Rate: 5 mL/hr at 02/02/15 1448, 5 mg/hr at 02/02/15 1448 .  metoprolol (LOPRESSOR) injection 5 mg, 5 mg, Intravenous, Once, Raeford RazorStephen Kohut, MD, Stopped at 02/02/15 1319  Current outpatient prescriptions:  .  ACCU-CHEK SOFTCLIX LANCETS lancets, USE AS INSTRUCTED, Disp: 100 each, Rfl: 1 .  ADVAIR HFA 115-21 MCG/ACT inhaler, Inhale 2 puffs into the lungs 2 (two) times daily., Disp: , Rfl:  .  albuterol (PROVENTIL HFA;VENTOLIN HFA) 108 (90 BASE) MCG/ACT inhaler, Inhale 2 puffs into the lungs every 6 (six) hours as needed for wheezing., Disp: 1 Inhaler, Rfl: 0 .  ALPRAZolam (XANAX) 0.5 MG tablet, Take 1 tablet (0.5 mg total) by mouth 3 (three) times daily as needed  for anxiety., Disp: 30 tablet, Rfl: 0 .  aspirin 81 MG tablet, Take 81 mg by mouth daily., Disp: , Rfl:  .  atorvastatin (LIPITOR) 40 MG tablet, Take 1 tablet by mouth  every night at bedtime, Disp: 90 tablet, Rfl: 3 .  B-D ULTRAFINE III SHORT PEN 31G X 8 MM MISC, Use as directed, Disp: 240 each, Rfl: 3 .  clopidogrel (PLAVIX) 75 MG tablet, Take 1 tablet by mouth  every day, Disp: 90 tablet, Rfl: 3 .  furosemide (LASIX) 20 MG tablet, Take 20 mg by mouth daily as needed (fluid). , Disp: , Rfl:  .  glucose blood (ACCU-CHEK AVIVA  PLUS) test strip, Check bs 4-5 times per day DX-250.00, Disp: 100 each, Rfl: 11 .  insulin lispro (HUMALOG) 100 UNIT/ML KiwkPen, Inject 10units into the skin with breakfast and lunch, and inject 20units into the skin with dinner., Disp: 30 mL, Rfl: 11 .  isosorbide mononitrate (IMDUR) 60 MG 24 hr tablet, Take 1 and 1/2 tablets by  mouth every day, Disp: 135 tablet, Rfl: 1 .  LANTUS SOLOSTAR 100 UNIT/ML Solostar Pen, Inject subcutaneously 65  units every night at  bedtime, Disp: 60 mL, Rfl: 3 .  levothyroxine (SYNTHROID, LEVOTHROID) 125 MCG tablet, Take 1 tablet (125 mcg total) by mouth daily., Disp: 90 tablet, Rfl: 3 .  linagliptin (TRADJENTA) 5 MG TABS tablet, Take 1 tablet (5 mg total) by mouth daily., Disp: 90 tablet, Rfl: 3 .  losartan (COZAAR) 100 MG tablet, Take 1 tablet by mouth  every day, Disp: 90 tablet, Rfl: 3 .  metFORMIN (GLUCOPHAGE) 850 MG tablet, Take 1 tablet by mouth  twice a day, Disp: 180 tablet, Rfl: 3 .  metoprolol (TOPROL-XL) 200 MG 24 hr tablet, Take one-half tablet by  mouth every day, Disp: 45 tablet, Rfl: 1 .  nitroGLYCERIN (NITROSTAT) 0.4 MG SL tablet, Place 0.4 mg under the tongue every 5 (five) minutes as needed for chest pain. , Disp: , Rfl:  .  SYRINGE/NEEDLE, DISP, 1 ML (B-D SYRINGE/NEEDLE 1CC/25GX5/8) 25G X 5/8" 1 ML MISC, As directed, Disp: 100 each, Rfl: 5 .  tiotropium (SPIRIVA) 18 MCG inhalation capsule, Place 1 capsule into inhaler and inhale daily. , Disp: , Rfl:  .  zolpidem (AMBIEN) 10 MG tablet, Take 1 tablet (10 mg total) by mouth at bedtime as needed., Disp: 30 tablet, Rfl: 2 .  glyBURIDE (DIABETA) 5 MG tablet, Take 1 tablet by mouth  twice a day (Patient not taking: Reported on 02/02/2015), Disp: 180 tablet, Rfl: 3 .  levothyroxine (SYNTHROID, LEVOTHROID) 100 MCG tablet, Take 1 tablet by mouth  daily (Patient not taking: Reported on 02/02/2015), Disp: 90 tablet, Rfl: 2 .  niacin (NIASPAN) 1000 MG CR tablet, Take 1 tablet by mouth  every day (Patient not  taking: Reported on 02/02/2015), Disp: 90 tablet, Rfl: 3   Allergies:  Allergies  Allergen Reactions  . Altace [Ramipril]     Cough     History   Social History  . Marital Status: Divorced    Spouse Name: N/A  . Number of Children: 1  . Years of Education: N/A   Occupational History  .  [employer]   Social History Main Topics  . Smoking status: Former Smoker -- 1.50 packs/day for 35 years    Types: Cigarettes    Quit date: 10/24/2007  . Smokeless tobacco: Never Used  . Alcohol Use: Yes     Comment: social  . Drug Use: No  .  Sexual Activity: Not on file   Other Topics Concern  . Not on file   Social History Narrative     Family History  Problem Relation Age of Onset  . Breast cancer Mother   . Lung cancer Father   . Diabetes type II Mother      Review of Systems: All other systems reviewed and are otherwise negative except as noted above.  Physical Exam: Filed Vitals:   02/02/15 1357 02/02/15 1400 02/02/15 1415 02/02/15 1430  BP: 155/90 150/111 158/103 144/103  Pulse: 90 176 97 176  Temp:      TempSrc:      Resp: 33 SpO2: 91% 92% 92% 92%    GEN- The patient is chronically ill in extremis appearing, alert and oriented x 3 today.   HEENT: normocephalic, atraumatic; sclera clear, conjunctiva pink; hearing intact; oropharynx clear; neck supple, no JVP Lymph- no cervical lymphadenopathy Lungs- Increased work of breathing with use of accessory muscles, tachypneic Heart- Regular rate and rhythm, no murmurs, rubs or gallops  GI- soft, non-tender, non-distended, bowel sounds present  Extremities- no clubbing, cyanosis, or edema; DP/PT/radial pulses 1+ bilaterally, right foot slightly cool to touch but with intact pulses, healing wound on anterior shin MS- no significant deformity or atrophy Psych- euthymic mood, full affect Neuro- strength and sensation are intact  Labs:   Lab Results  Component Value Date   WBC 8.0 02/02/2015   HGB 13.6  02/02/2015   HCT 41.5 02/02/2015   MCV 89.6 02/02/2015   PLT 190 02/02/2015     Recent Labs Lab 02/02/15 1250  NA 142  K 4.5  CL 106  CO2 25  BUN 25*  CREATININE 1.58*  CALCIUM 9.1  GLUCOSE 218*    EKG: sinus tach with PAC's, rate 105, IRBBB, prior inferior infarct  TELEMETRY: sinus rhythm with intermittent long-RP tachycardia with occasional aberrancy    Assessment/Plan: 1.  Atrial tach In the setting of acute respiratory failure with underlying pulmonary fibrosis. Would avoid amiodarone with underlying lung disease.  Start IV Cardizem and titrate as needed. He is a poor candidate for EP procedures with significant lung disease.  Tachycardia will likely improve with management of acute respiratory distress.    2.  Pulmonary fibrosis/acute respiratory distress Pt has significant respiratory distress and has had increased shortness of breath for the last 4 days. His pulmonary fibrosis is followed at [facility]. He would like to establish with pulmonary in [place].  Would recommend CCM evaluation for respiratory status.  He is acutely quite ill and prognosis appears poor.  3. CAD No recent ischemic symptoms Continue ASA/Plavix/BB  4.  Elevated troponin Likely due to sustained and recurrent SVT.  I would not favor ischemic workup presently given acute respiratory failure from PF.  Signed, Gypsy Balsam, NP 02/02/2015 3:04 PM   I have seen, examined the patient, and reviewed the above assessment and plan.  Changes to above are made where necessary.  Pt is clinically very ill.  Not a candidate for EP procedures.  Presents with long RP tachycardia which likely represents atrial tachycardia.  At times, aberrancy causes widening of QRS.  I do not see any documented afib or VT.  Would favor IV cardizem over amiodarone in the setting of his lung disease.  General Cardiology to follow.  Electrophysiology team to see as needed while here. Please call with questions.   Co Sign: Hillis Range, MD 02/02/2015 5:00 PM

## 2015-02-02 NOTE — ED Notes (Signed)
Pt now in a fib at a rate of 90. ECG captured and shown to MD. Pt alert and oriented, denies pain.

## 2015-02-02 NOTE — ED Notes (Signed)
Per critical care give 80 mg of lasix instead of 60.

## 2015-02-02 NOTE — ED Notes (Addendum)
Pt hr now at 185, no chest pain, MD Kohut notified. Recommending pushing 12 mg of adenosine. Pt hooked up to zoll for monitoring. NS infusion going at 999 ml/hr.

## 2015-02-03 ENCOUNTER — Inpatient Hospital Stay (HOSPITAL_COMMUNITY): Payer: Medicare Other

## 2015-02-03 DIAGNOSIS — I509 Heart failure, unspecified: Secondary | ICD-10-CM

## 2015-02-03 DIAGNOSIS — J84112 Idiopathic pulmonary fibrosis: Secondary | ICD-10-CM

## 2015-02-03 DIAGNOSIS — I5031 Acute diastolic (congestive) heart failure: Secondary | ICD-10-CM | POA: Diagnosis present

## 2015-02-03 DIAGNOSIS — E876 Hypokalemia: Secondary | ICD-10-CM | POA: Diagnosis present

## 2015-02-03 LAB — COMPREHENSIVE METABOLIC PANEL
ALT: 22 U/L (ref 0–53)
AST: 19 U/L (ref 0–37)
Albumin: 3.3 g/dL — ABNORMAL LOW (ref 3.5–5.2)
Alkaline Phosphatase: 52 U/L (ref 39–117)
Anion gap: 13 (ref 5–15)
BUN: 21 mg/dL (ref 6–23)
CO2: 23 mmol/L (ref 19–32)
Calcium: 8.8 mg/dL (ref 8.4–10.5)
Chloride: 104 mmol/L (ref 96–112)
Creatinine, Ser: 1.48 mg/dL — ABNORMAL HIGH (ref 0.50–1.35)
GFR calc Af Amer: 57 mL/min — ABNORMAL LOW (ref >90)
GFR calc non Af Amer: 49 mL/min — ABNORMAL LOW (ref >90)
GLUCOSE: 190 mg/dL — AB (ref 70–99)
POTASSIUM: 3.6 mmol/L (ref 3.5–5.1)
Sodium: 140 mmol/L (ref 135–145)
TOTAL PROTEIN: 6.4 g/dL (ref 6.0–8.3)
Total Bilirubin: 1.4 mg/dL — ABNORMAL HIGH (ref 0.3–1.2)

## 2015-02-03 LAB — GLUCOSE, CAPILLARY
GLUCOSE-CAPILLARY: 188 mg/dL — AB (ref 70–99)
GLUCOSE-CAPILLARY: 188 mg/dL — AB (ref 70–99)
GLUCOSE-CAPILLARY: 323 mg/dL — AB (ref 70–99)
Glucose-Capillary: 175 mg/dL — ABNORMAL HIGH (ref 70–99)
Glucose-Capillary: 231 mg/dL — ABNORMAL HIGH (ref 70–99)
Glucose-Capillary: 231 mg/dL — ABNORMAL HIGH (ref 70–99)

## 2015-02-03 MED ORDER — ASPIRIN EC 81 MG PO TBEC
81.0000 mg | DELAYED_RELEASE_TABLET | Freq: Every day | ORAL | Status: DC
  Administered 2015-02-03 – 2015-02-05 (×3): 81 mg via ORAL
  Filled 2015-02-03 (×3): qty 1

## 2015-02-03 MED ORDER — DILTIAZEM HCL 30 MG PO TABS
30.0000 mg | ORAL_TABLET | Freq: Four times a day (QID) | ORAL | Status: DC
  Administered 2015-02-03 – 2015-02-05 (×10): 30 mg via ORAL
  Filled 2015-02-03 (×12): qty 1

## 2015-02-03 MED ORDER — INSULIN ASPART 100 UNIT/ML ~~LOC~~ SOLN
0.0000 [IU] | Freq: Three times a day (TID) | SUBCUTANEOUS | Status: DC
  Administered 2015-02-04: 5 [IU] via SUBCUTANEOUS
  Administered 2015-02-04: 2 [IU] via SUBCUTANEOUS
  Administered 2015-02-04 – 2015-02-05 (×3): 3 [IU] via SUBCUTANEOUS
  Administered 2015-02-05: 2 [IU] via SUBCUTANEOUS
  Administered 2015-02-05: 5 [IU] via SUBCUTANEOUS

## 2015-02-03 MED ORDER — POTASSIUM CHLORIDE CRYS ER 20 MEQ PO TBCR
40.0000 meq | EXTENDED_RELEASE_TABLET | Freq: Once | ORAL | Status: AC
  Administered 2015-02-03: 40 meq via ORAL
  Filled 2015-02-03: qty 2

## 2015-02-03 MED ORDER — FUROSEMIDE 10 MG/ML IJ SOLN
60.0000 mg | Freq: Once | INTRAMUSCULAR | Status: AC
  Administered 2015-02-03: 60 mg via INTRAVENOUS
  Filled 2015-02-03: qty 6

## 2015-02-03 NOTE — Progress Notes (Signed)
     SUBJECTIVE: No complaints this am.   Tele: sinus with PACs  BP 113/67 mmHg  Pulse 63  Temp(Src) 97.9 F (36.6 C) (Oral)  Resp 23  Ht 6\' 1"  (1.854 m)  Wt 265 lb 3.4 oz (120.3 kg)  BMI 35.00 kg/m2  SpO2 92%  Intake/Output Summary (Last 24 hours) at 02/03/15 0640 Last data filed at 02/03/15 0600  Gross per 24 hour  Intake 397.42 ml  Output   3825 ml  Net -3427.58 ml    PHYSICAL EXAM General: Well developed, well nourished, in no acute distress. Alert and oriented x 3.  Psych:  Good affect, responds appropriately Neck: No JVD. No masses noted.  Lungs: Clear bilaterally with no wheezes or rhonci noted.  Heart: RRR with no murmurs noted. Abdomen: Bowel sounds are present. Soft, non-tender.  Extremities: No lower extremity edema.   LABS: Basic Metabolic Panel:  Recent Labs  16/07/9603/12/16 1250 02/03/15 0250  NA 142 140  K 4.5 3.6  CL 106 104  CO2 25 23  GLUCOSE 218* 190*  BUN 25* 21  CREATININE 1.58* 1.48*  CALCIUM 9.1 8.8  MG 1.6  --    CBC:  Recent Labs  02/02/15 1250  WBC 8.0  NEUTROABS 5.7  HGB 13.6  HCT 41.5  MCV 89.6  PLT 190   Cardiac Enzymes:  Recent Labs  02/02/15 1250  TROPONINI 0.05*   Current Meds: . antiseptic oral rinse  7 mL Mouth Rinse q12n4p  . aspirin  81 mg Oral Daily  . atorvastatin  40 mg Oral q1800  . chlorhexidine  15 mL Mouth Rinse BID  . clopidogrel  75 mg Oral Daily  . heparin  5,000 Units Subcutaneous 3 times per day  . insulin aspart  0-9 Units Subcutaneous 6 times per day  . levothyroxine  125 mcg Oral QAC breakfast  . metoprolol  200 mg Oral Daily  . mometasone-formoterol  2 puff Inhalation BID  . tiotropium  18 mcg Inhalation Daily    ASSESSMENT AND PLAN:  1. Atrial tachycardia: He was seen by EP yesterday. He has a long RP tachycardia which likely represents atrial tachycardia. At times, aberrancy causes widening of QRS.No documented afib or VT. Not felt to be a candidate for EP procedures currently. He  is now on IV Cardizem. Would change to po Cardizem later today if rhythm is stable. Tachycardia will likely improve with management of acute respiratory distress.   2. Pulmonary fibrosis/acute respiratory distress: Per CCM.   3. CAD/Elevated troponin: Troponin elevation is likely due to demand ischemia with recurrent and sustained SVT. Continue ASA/Plavix/beta blocker. No ischemic evaluation is planned.   Should be ok to transfer to telemetry unit today from cardiac standpoint.    Vic Esco  4/13/20166:40 AM

## 2015-02-03 NOTE — Progress Notes (Signed)
PULMONARY / CRITICAL CARE MEDICINE   Name: Jerry NewtonRobert S Montgomery MRN: [id number] DOB: 12/19/1955    ADMISSION DATE:  02/02/2015  REFERRING MD :  Dr. Juleen ChinaKohut   CHIEF COMPLAINT:  Hypoxic Respiratory Failure   INITIAL PRESENTATION:  59 y/o M, smoker, with PMH of morbid obesity, DM, CAD s/p stent, HTN, HLD, hypothyroidism. Atrial tachycardia, OSA, chronic respiratory failure in on the basis of severe pulmonary fibrosis and COPD. Admitted on 4/12 w/ 4 day h/o increased dyspnea and fatigue. On presentation found to be hypoxic w/ O2 sats in 60s on 4 liters and HR in 170-180s. PCCM asked to admit  STUDIES:    SIGNIFICANT EVENTS:   SUBJECTIVE:  Markedly improved dyspnea, no acute distress  VITAL SIGNS: Temp:  [97.5 F (36.4 C)-98.8 F (37.1 C)] 98.2 F (36.8 C) (04/13 0800) Pulse Rate:  [44-188] 65 (04/13 0900) Resp:  [14-36] 24 (04/13 0900) BP: (99-164)/(59-123) 127/65 mmHg (04/13 0900) SpO2:  [88 %-100 %] 92 % (04/13 0900) FiO2 (%):  [40 %-50 %] 40 % (04/12 1800) Weight:  [120.3 kg (265 lb 3.4 oz)-121.11 kg (267 lb)] 120.3 kg (265 lb 3.4 oz) (04/12 1800)   HEMODYNAMICS:     VENTILATOR SETTINGS: Vent Mode:  [-]  FiO2 (%):  [40 %-50 %] 40 %   INTAKE / OUTPUT:  Intake/Output Summary (Last 24 hours) at 02/03/15 0950 Last data filed at 02/03/15 0900  Gross per 24 hour  Intake 430.42 ml  Output   3825 ml  Net -3394.58 ml    PHYSICAL EXAMINATION: Gen: comfortable in bed HENT: OP clear,  neck supple Eyes: EOMi, sclera anicteric PULM: Coarse crackles throughout CV: Irreg irreg, no mgr, no JVD, no edema in legs GI: BS+, soft, nontender Derm: no cyanosis or rash Psyche: normal mood and affect Neuro: A&Ox4, maew   LABS:  CBC  Recent Labs Lab 02/02/15 1250  WBC 8.0  HGB 13.6  HCT 41.5  PLT 190   Coag's No results for input(s): APTT, INR in the last 168 hours.   BMET  Recent Labs Lab 02/02/15 1250 02/03/15 0250  NA 142 140  K 4.5 3.6  CL 106 104  CO2 25 23   BUN 25* 21  CREATININE 1.58* 1.48*  GLUCOSE 218* 190*   Electrolytes  Recent Labs Lab 02/02/15 1250 02/03/15 0250  CALCIUM 9.1 8.8  MG 1.6  --    Sepsis Markers No results for input(s): LATICACIDVEN, PROCALCITON, O2SATVEN in the last 168 hours.   ABG No results for input(s): PHART, PCO2ART, PO2ART in the last 168 hours.   Liver Enzymes  Recent Labs Lab 02/03/15 0250  AST 19  ALT 22  ALKPHOS 52  BILITOT 1.4*  ALBUMIN 3.3*     Cardiac Enzymes  Recent Labs Lab 02/02/15 1250  TROPONINI 0.05*   Glucose  Recent Labs Lab 02/02/15 2028 02/02/15 2344 02/03/15 0433 02/03/15 0722  GLUCAP 195* 188* 188* 175*    Imaging CXR personally reviewed 4/13 > chronic fibrotic change, likely superimposed edema  ASSESSMENT / PLAN:  PULMONARY OETT > NCA A: Acute on chronic Hypoxic Respiratory Failure: suspect element of edema exacerbated by AF and decompensated HF superimposed on ILD > improved, still with higher than baseline O2 requirements [facility]. Failed Pirfenidone> [facility] COPD: not currently in exacerbation  P:   Continue diuresis > lasix x1 Wean O2 for O2 saturation > 90% D/C BIPAP PRN xopenx, Scheduled Dulera Will make appointment for him  to f/u with Dr. [name] for his IPF May be candidate for clinical trial for IPF  CARDIOVASCULAR CVL A:  SVT/Atrial Tachycardia with Aberrant Conduction   > rate controlled 4/13 Hx Atrial Tachycardia  Grade 2 diastolic dysfunction  CAD P:  Convert dilt to po today Avoid amio given ILD Will defer decision on echo to cardiology He is on ASA; will need to decide on long term anticoagulation (OK from my standpoint) Transfer to tele bed  RENAL A:   Acute Kidney Injury > improved post diuresis Hypokalemia P:   Monitor BMET and UOP Replace electrolytes as needed Jonathon Bellows 4/13)  GASTROINTESTINAL A:   Morbid Obesity  P:   PPI NPO for now   HEMATOLOGIC A:   No acute P:  Trend CBC  INFECTIOUS A:  No acute issue  P:   Monitor for fever  ENDOCRINE A:   Diabetes Mellitus  Hypothyroidism   > TSH WNL P:   ssi   NEUROLOGIC A:  No acute  P:   Supportive care    FAMILY  - Updates: daughter updated at bedside 4/13   Heber Rockford, MD [place] P[facility] Pager: [phone] Cell: [phone] If no response, call [phone]  02/03/2015, 9:50 AM

## 2015-02-03 NOTE — Care Management Note (Addendum)
    Page 1 of 1   02/05/2015     2:34:26 PM CARE MANAGEMENT NOTE 02/05/2015  Patient:  Jerry Montgomery,Jerry Montgomery   Account Number:  [id number]  Date Initiated:  02/03/2015  Documentation initiated by:  Junius CreamerWELL,DEBBIE  Subjective/Objective Assessment:   adm w at fib     Action/Plan:   lives w fam, pcp dr Broadus Johnwarren pickard   Anticipated DC Date:  02/05/2015   Anticipated DC Plan:  HOME/SELF CARE      DC Planning Services  CM consult      Choice offered to / List presented to:             Status of service:  In process, will continue to follow Medicare Important Message given?  YES (If response is "NO", the following Medicare IM given date fields will be blank) Date Medicare IM given:  02/05/2015 Medicare IM given by:  James Senn Date Additional Medicare IM given:   Additional Medicare IM given by:    Discharge Disposition:    Per UR Regulation:  Reviewed for med. necessity/level of care/duration of stay  If discussed at Long Length of Stay Meetings, dates discussed:    Comments:  02/05/15 Sidney AceJulie Harmonii Karle, RN, BSN [phone] Pt adm on 02/02/15 with respiratory failure, SVT.  PTA, pt resides at home with ex wife.  PTA, pt indpendent, lives with ex-wife.  He is on chronic home oxygen with [facility].  Pt denies any needs for home--refuses HHPT.  Will follow progress.

## 2015-02-04 ENCOUNTER — Encounter (HOSPITAL_COMMUNITY): Payer: Self-pay | Admitting: Physician Assistant

## 2015-02-04 DIAGNOSIS — I4891 Unspecified atrial fibrillation: Secondary | ICD-10-CM | POA: Insufficient documentation

## 2015-02-04 DIAGNOSIS — J449 Chronic obstructive pulmonary disease, unspecified: Secondary | ICD-10-CM

## 2015-02-04 DIAGNOSIS — I5031 Acute diastolic (congestive) heart failure: Principal | ICD-10-CM

## 2015-02-04 DIAGNOSIS — N179 Acute kidney failure, unspecified: Secondary | ICD-10-CM

## 2015-02-04 LAB — BASIC METABOLIC PANEL
ANION GAP: 10 (ref 5–15)
BUN: 22 mg/dL (ref 6–23)
CALCIUM: 8.9 mg/dL (ref 8.4–10.5)
CHLORIDE: 103 mmol/L (ref 96–112)
CO2: 28 mmol/L (ref 19–32)
Creatinine, Ser: 1.54 mg/dL — ABNORMAL HIGH (ref 0.50–1.35)
GFR calc Af Amer: 54 mL/min — ABNORMAL LOW (ref >90)
GFR calc non Af Amer: 47 mL/min — ABNORMAL LOW (ref >90)
GLUCOSE: 170 mg/dL — AB (ref 70–99)
Potassium: 4 mmol/L (ref 3.5–5.1)
Sodium: 141 mmol/L (ref 135–145)

## 2015-02-04 LAB — GLUCOSE, CAPILLARY
GLUCOSE-CAPILLARY: 170 mg/dL — AB (ref 70–99)
GLUCOSE-CAPILLARY: 315 mg/dL — AB (ref 70–99)
Glucose-Capillary: 297 mg/dL — ABNORMAL HIGH (ref 70–99)
Glucose-Capillary: 232 mg/dL — ABNORMAL HIGH (ref 70–99)

## 2015-02-04 MED ORDER — INSULIN GLARGINE 100 UNIT/ML ~~LOC~~ SOLN
15.0000 [IU] | Freq: Every day | SUBCUTANEOUS | Status: DC
  Administered 2015-02-04: 15 [IU] via SUBCUTANEOUS
  Filled 2015-02-04 (×2): qty 0.15

## 2015-02-04 MED ORDER — INSULIN ASPART 100 UNIT/ML ~~LOC~~ SOLN
4.0000 [IU] | Freq: Three times a day (TID) | SUBCUTANEOUS | Status: DC
  Administered 2015-02-04 – 2015-02-05 (×3): 4 [IU] via SUBCUTANEOUS

## 2015-02-04 MED ORDER — FUROSEMIDE 20 MG PO TABS
20.0000 mg | ORAL_TABLET | Freq: Every day | ORAL | Status: DC
  Administered 2015-02-04 – 2015-02-05 (×2): 20 mg via ORAL
  Filled 2015-02-04 (×2): qty 1

## 2015-02-04 NOTE — Clinical Documentation Improvement (Signed)
"  Acute Heart Failure" documented in current medical record.  Patient has been treated with IV Lasix.  If know or able to determine, please document the TYPE of Acute Heart Failure in the progress notes, current hospital problem list and discharge summary:   - Systolic   - Diastolic  - Combined  - Unable to Clinically Determine  Thank You, Jerral Ralphathy R Angelene Rome ,RN Clinical Documentation Specialist:  [phone] [facility]- Health Information Management

## 2015-02-04 NOTE — Progress Notes (Signed)
Inpatient Diabetes Program Recommendations  AACE/ADA: New Consensus Statement on Inpatient Glycemic Control (2013)  Target Ranges:  Prepandial:   less than 140 mg/dL      Peak postprandial:   less than 180 mg/dL (1-2 hours)      Critically ill patients:  140 - 180 mg/dL   Reason for Visit:  Hyperglycemia  Results for Jerry Montgomery, Jerry Montgomery (MRN [id number]) as of 02/04/2015 15:17  Ref. Range 02/03/2015 11:15 02/03/2015 17:11 02/03/2015 20:00 02/04/2015 06:17 02/04/2015 11:44  Glucose-Capillary Latest Ref Range: 70-99 mg/dL 409231 (H) 811323 (H) 914231 (H) 170 (H) 297 (H)  Results for Jerry Montgomery, Jerry Montgomery (MRN [id number]) as of 02/04/2015 15:17  Ref. Range 01/01/2015 09:35  Hemoglobin A1C Latest Ref Range: <5.7 % 7.6 (H)    Inpatient Diabetes Program Recommendations Insulin - Basal: Add Lantus 15 units QHS Insulin - Meal Coverage: Add Novolog 4 units tidwc for meal coverage insulin if pt eats > 50% meal HgbA1C: 7.6%  Note: Will continue to follow. Thank you. Ailene Ardshonda Gurjot Brisco, RD, LDN, CDE Inpatient Diabetes Coordinator [phone]

## 2015-02-04 NOTE — Progress Notes (Signed)
[facility] PROGRESS NOTE  Charlotta NewtonRobert S Florendo EAV:[id number] DOB: 01/17/1956 DOA: 02/02/2015 PCP: [name],[name], MD  Assessment/Plan: 1. Hypoxic respiratory failure secondary to ILD and acutely decompensated diastolic CHF 1. Pulmonary and Cardiology following 2. Cont to wean O2 as tolerated. Currently on 3LNC (was 5L overnight) 3. On PRN xopenex and Dulera 4. Ultimately, pt to follow up with Dr. [name] for IPF and consideration for possible clinical trial 5. Pt to resume home lasix at 20mg  daily. Net neg 4.66L since admit 6. Wt today: 113.9kg<<<120.3kg on admit 2. SVT/Atrial Tach 1. Currently rate controlled 2. Cardiology following 3. On cardizem PO 3. AKI 1. Cr remaining stable 2. Cont to monitor renal function 4. Morbid obesity 1. Advise wt loss 5. DM 1. Glucose poorly controlled, in the 300's 2. Will add lantus 15 units and premeal aspart at 4 units 3. Cont SSI coverage 6. DVT prophylaxis 1. Heparin subQ   Code Status: Full Family Communication: Pt in room, daughter at bedside (indicate person spoken with, relationship, and if by phone, the number) Disposition Plan: Pending   Consultants:  Cardiology  Critical Care  Procedures:    Antibiotics:  none (indicate start date, and stop date if known)  HPI/Subjective: Feels better today. Still mildly sob  Objective: Filed Vitals:   02/04/15 0818 02/04/15 1100 02/04/15 1301 02/04/15 1608  BP: 121/77   121/72  Pulse: 68   49  Temp:    98.9 F (37.2 C)  TempSrc: Oral   Oral  Resp: 20     Height:      Weight:      SpO2: 91% 93% 92% 91%    Intake/Output Summary (Last 24 hours) at 02/04/15 1639 Last data filed at 02/04/15 1610  Gross per 24 hour  Intake   1200 ml  Output   1950 ml  Net   -750 ml   Filed Weights   02/02/15 1800 02/03/15 1202 02/04/15 0621  Weight: 120.3 kg (265 lb 3.4 oz) 115.35 kg (254 lb 4.8 oz) 113.944 kg (251 lb 3.2 oz)    Exam:   General:  Awake, eating lunch, in  nad  Cardiovascular: regular, s1, s2  Respiratory: slightly increased resp effort, decreased BS B  Abdomen: obese, nondistended  Musculoskeletal: perfused, no clubbing   Data Reviewed: Basic Metabolic Panel:  Recent Labs Lab 02/02/15 1250 02/03/15 0250 02/04/15 0509  NA 142 140 141  K 4.5 3.6 4.0  CL 106 104 103  CO2 25 23 28   GLUCOSE 218* 190* 170*  BUN 25* 21 22  CREATININE 1.58* 1.48* 1.54*  CALCIUM 9.1 8.8 8.9  MG 1.6  --   --    Liver Function Tests:  Recent Labs Lab 02/03/15 0250  AST 19  ALT 22  ALKPHOS 52  BILITOT 1.4*  PROT 6.4  ALBUMIN 3.3*   No results for input(s): LIPASE, AMYLASE in the last 168 hours. No results for input(s): AMMONIA in the last 168 hours. CBC:  Recent Labs Lab 02/02/15 1250  WBC 8.0  NEUTROABS 5.7  HGB 13.6  HCT 41.5  MCV 89.6  PLT 190   Cardiac Enzymes:  Recent Labs Lab 02/02/15 1250  TROPONINI 0.05*   BNP (last 3 results) No results for input(s): BNP in the last 8760 hours.  ProBNP (last 3 results) No results for input(s): PROBNP in the last 8760 hours.  CBG:  Recent Labs Lab 02/03/15 1711 02/03/15 2000 02/04/15 0617 02/04/15 1144 02/04/15 1625  GLUCAP 323* 231* 170* 297* 315*  Recent Results (from the past 240 hour(s))  MRSA PCR Screening     Status: None   Collection Time: 02/02/15  6:00 PM  Result Value Ref Range Status   MRSA by PCR NEGATIVE NEGATIVE Final    Comment:        The GeneXpert MRSA Assay (FDA approved for NASAL specimens only), is one component of a comprehensive MRSA colonization surveillance program. It is not intended to diagnose MRSA infection nor to guide or monitor treatment for MRSA infections.      Studies: Dg Chest Port 1 View  02/03/2015   CLINICAL DATA:  59 year old male with acute respiratory failure. Initial encounter.  EXAM: PORTABLE CHEST - 1 VIEW  COMPARISON:  02/02/2015 and earlier.  FINDINGS: Portable AP semi upright view at 0556 hours. Stable lung  volumes and ventilation since yesterday with widespread combined interstitial and airspace opacity. Stable cardiomegaly and mediastinal contours. No pneumothorax.  IMPRESSION: Stable ventilation since yesterday. Top differential considerations remain pulmonary edema and bilateral pneumonia.   Electronically Signed   By: Odessa Fleming M.D.   On: 02/03/2015 08:02    Scheduled Meds: . aspirin EC  81 mg Oral Daily  . atorvastatin  40 mg Oral q1800  . clopidogrel  75 mg Oral Daily  . diltiazem  30 mg Oral 4 times per day  . furosemide  20 mg Oral Daily  . heparin  5,000 Units Subcutaneous 3 times per day  . insulin aspart  0-9 Units Subcutaneous TID WC & HS  . levothyroxine  125 mcg Oral QAC breakfast  . metoprolol  200 mg Oral Daily  . mometasone-formoterol  2 puff Inhalation BID  . tiotropium  18 mcg Inhalation Daily   Continuous Infusions: . sodium chloride      Principal Problem:   Acute respiratory failure with hypoxia Active Problems:   COPD II   SVT (supraventricular tachycardia)   Acute diastolic heart failure   Atrial tachycardia   Hypokalemia   IPF (idiopathic pulmonary fibrosis)   Atrial fibrillation with RVR   Acute renal failure syndrome   Acute pulmonary edema    Arly Salminen K  [facility] Pager [phone]. If 7PM-7AM, please contact night-coverage at [email or web address], password [facility] 02/04/2015, 4:39 PM  LOS: 2 days

## 2015-02-04 NOTE — Evaluation (Signed)
Physical Therapy Evaluation Patient Details Name: Jerry NewtonRobert S Montgomery MRN: [id number] DOB: 03/31/1956 Today's Date: 02/04/2015   History of Present Illness  Patient is a 59 y/o male with PMH of morbid obesity, DM, CAD s/p stent, HTN, HLD, hypothyroidism, atrial tachycardia, OSA, chronic respiratory failure on the basis of severe pulmonary fibrosis and COPD presents with SOB and fatigue. Found to be hypoxic w/ O2 sats in 60s on 4 liters and HR in 170-180s. Admitted with a-fib with RVR, acute pulmonary edema and hypoxemic respiratory failure     Clinical Impression  Patient presents with dyspnea on exertion with drop in Sa02 with activity, generalized weakness and impaired cardiovascular endurance impacting mobility. Education provided on energy conservation techniques and pursed lip breathing to improve ventilation and oxygen saturation. Pt refusing HHPT however would benefit to improve endurance, balance and mobility so pt can maximize independence and function. Pt lives alone but has very supportive daughter.     Follow Up Recommendations Home health PT;Supervision/Assistance - 24 hour    Equipment Recommendations  None recommended by PT    Recommendations for Other Services OT consult     Precautions / Restrictions Precautions Precautions: Fall Precaution Comments: monitor 02 Restrictions Weight Bearing Restrictions: No      Mobility  Bed Mobility Overal bed mobility: Modified Independent                Transfers Overall transfer level: Needs assistance Equipment used: None Transfers: Sit to/from Stand Sit to Stand: Min guard         General transfer comment: Min guard for safety. Unsteady upon standing.  Ambulation/Gait Ambulation/Gait assistance: Min guard Ambulation Distance (Feet): 100 Feet Assistive device: None Gait Pattern/deviations: Step-through pattern;Decreased stride length;Drifts right/left   Gait velocity interpretation: Below normal speed for  age/gender General Gait Details: Pt with slow, unsteady gait. Dyspnea 3/4 on scale. Sa02 decreased to 80% on 3L 02 [place]. CUes for pursed lip breathing.   Stairs            Wheelchair Mobility    Modified Rankin (Stroke Patients Only)       Balance Overall balance assessment: Needs assistance Sitting-balance support: Feet supported;No upper extremity supported Sitting balance-Leahy Scale: Good     Standing balance support: During functional activity Standing balance-Leahy Scale: Fair                               Pertinent Vitals/Pain Pain Assessment: No/denies pain    Home Living Family/patient expects to be discharged to:: Private residence Living Arrangements: Alone Available Help at Discharge: Family;Available PRN/intermittently (Daughter lives across the street.) Type of Home: House Home Access: Stairs to enter Entrance Stairs-Rails: Right Entrance Stairs-Number of Steps: 4 Home Layout: One level Home Equipment: Environmental consultantWalker - 2 wheels;Cane - single point;Crutches;Wheelchair - manual      Prior Function Level of Independence: Independent         Comments: Wears 02 PRN.     Hand Dominance        Extremity/Trunk Assessment   Upper Extremity Assessment: Defer to OT evaluation           Lower Extremity Assessment: Generalized weakness         Communication   Communication: No difficulties  Cognition Arousal/Alertness: Awake/alert Behavior During Therapy: WFL for tasks assessed/performed Overall Cognitive Status: Within Functional Limits for tasks assessed  General Comments      Exercises        Assessment/Plan    PT Assessment Patient needs continued PT services  PT Diagnosis Difficulty walking;Generalized weakness   PT Problem List Decreased strength;Cardiopulmonary status limiting activity;Decreased balance;Decreased activity tolerance;Decreased mobility  PT Treatment Interventions Balance  training;Gait training;Stair training;Patient/family education;Functional mobility training;Therapeutic activities;Therapeutic exercise   PT Goals (Current goals can be found in the Care Plan section) Acute Rehab PT Goals Patient Stated Goal: "to find out what caused all this" PT Goal Formulation: With patient Time For Goal Achievement: 02/18/15 Potential to Achieve Goals: Fair    Frequency Min 3X/week   Barriers to discharge Decreased caregiver support Pt lives alone    Co-evaluation               End of Session Equipment Utilized During Treatment: Gait belt;Oxygen Activity Tolerance: Other (comment) (decrease in Sa02) Patient left: in bed;with call bell/phone within reach;with bed alarm set Nurse Communication: Mobility status         Time: 1610-9604 PT Time Calculation (min) (ACUTE ONLY): 26 min   Charges:   PT Evaluation $Initial PT Evaluation Tier I: 1 Procedure PT Treatments $Gait Training: 8-22 mins   PT G CodesAlvie Heidelberg A 02-21-2015, 4:57 PM Alvie Heidelberg, PT, DPT [phone]

## 2015-02-04 NOTE — Progress Notes (Signed)
Patient Name: Jerry NewtonRobert S Desjardin Date of Encounter: 02/04/2015  Principal Problem:   Acute respiratory failure with hypoxia Active Problems:   Acute diastolic heart failure   COPD II   SVT (supraventricular tachycardia)   Atrial tachycardia   Hypokalemia   IPF (idiopathic pulmonary fibrosis)    Primary Cardiologist: Dr. Tresa EndoKelly  Patient Profile: 59 yo male w/ hx of CAD (s/p LAD and prox circ stenting), HTN, HLD, DM, COPD, and pulmonary fibrosis on 3L O2, presented to ED 04/12 with worsening SOB and atrial tachycardia, initially admitted to ICU given hypoxic respiratory failure.  SUBJECTIVE: Reports shortness of breath seems to be at baseline (currently on 3L, which is home O2 requirement). Denies chest pain, palpitations.   OBJECTIVE Filed Vitals:   02/04/15 0226 02/04/15 0621 02/04/15 0818 02/04/15 1100  BP: 113/70 123/82 121/77   Pulse: 69 65 68   Temp: 98 F (36.7 C) 97.4 F (36.3 C)    TempSrc: Oral Oral Oral   Resp: 22 20 20    Height:      Weight:  251 lb 3.2 oz (113.944 kg)    SpO2: 90% 91% 91% 93%    Intake/Output Summary (Last 24 hours) at 02/04/15 1142 Last data filed at 02/04/15 0915  Gross per 24 hour  Intake   1200 ml  Output   2000 ml  Net   -800 ml   Filed Weights   02/02/15 1800 02/03/15 1202 02/04/15 0621  Weight: 265 lb 3.4 oz (120.3 kg) 254 lb 4.8 oz (115.35 kg) 251 lb 3.2 oz (113.944 kg)    PHYSICAL EXAM General: Well developed, pleasant male in no acute distress. Head: Normocephalic, atraumatic.  Neck: Supple without bruits, JVD not elevated. Lungs: Resp regular, breathing mildly labored with prolonged conversation, no respiratory distress, rales in bases bilaterally. Heart: RRR, S1, S2, no S3, S4, or murmur; no rub. Abdomen: Soft, non-tender, non-distended, BS + x 4.  Extremities: No clubbing, cyanosis, no edema.  Neuro: Alert and oriented X 3. Moves all extremities spontaneously. Psych: Normal affect.  LABS: CBC:  Recent Labs  02/02/15 1250  WBC 8.0  NEUTROABS 5.7  HGB 13.6  HCT 41.5  MCV 89.6  PLT 190   INR:No results for input(s): INR in the last 72 hours. Basic Metabolic Panel:  Recent Labs  81/19/1403/10/08 1250 02/03/15 0250 02/04/15 0509  NA 142 140 141  K 4.5 3.6 4.0  CL 106 104 103  CO2 25 23 28   GLUCOSE 218* 190* 170*  BUN 25* 21 22  CREATININE 1.58* 1.48* 1.54*  CALCIUM 9.1 8.8 8.9  MG 1.6  --   --    Liver Function Tests:  Recent Labs  02/03/15 0250  AST 19  ALT 22  ALKPHOS 52  BILITOT 1.4*  PROT 6.4  ALBUMIN 3.3*   Cardiac Enzymes:  Recent Labs  02/02/15 1250  TROPONINI 0.05*   Thyroid Function Tests:  Recent Labs  02/02/15 1725  TSH 1.933   TELE:        Radiology/Studies:  Dg Chest Port 1 View 02/03/2015   CLINICAL DATA:  59 year old male with acute respiratory failure. Initial encounter.  EXAM: PORTABLE CHEST - 1 VIEW  COMPARISON:  02/02/2015 and earlier.  FINDINGS: Portable AP semi upright view at 0556 hours. Stable lung volumes and ventilation since yesterday with widespread combined interstitial and airspace opacity. Stable cardiomegaly and mediastinal contours. No pneumothorax.  IMPRESSION: Stable ventilation since yesterday. Top differential considerations remain pulmonary edema and bilateral pneumonia.  Electronically Signed   By: Odessa Fleming M.D.   On: 02/03/2015 08:02   Dg Chest Portable 1 View 02/02/2015   CLINICAL DATA:  59 year old male with irregular heart rate and shortness of Breath for several days. Initial encounter.  EXAM: PORTABLE CHEST - 1 VIEW  COMPARISON:  06/05/2013.  FINDINGS: Portable AP view at 1420 hours. Diffuse increased pulmonary opacity, mixed interstitial and airspace. Resuscitation pads project over the left chest. Mildly lower lung volumes. Stable cardiomegaly and mediastinal contours. No pneumothorax. No large pleural effusion.  IMPRESSION: Diffuse increased bilateral pulmonary opacity, top considerations are bilateral pneumonia and acute  pulmonary edema.   Electronically Signed   By: Odessa Fleming M.D.   On: 02/02/2015 15:41    Current Medications:  . aspirin EC  81 mg Oral Daily  . atorvastatin  40 mg Oral q1800  . clopidogrel  75 mg Oral Daily  . diltiazem  30 mg Oral 4 times per day  . heparin  5,000 Units Subcutaneous 3 times per day  . insulin aspart  0-9 Units Subcutaneous TID WC & HS  . levothyroxine  125 mcg Oral QAC breakfast  . metoprolol  200 mg Oral Daily  . mometasone-formoterol  2 puff Inhalation BID  . tiotropium  18 mcg Inhalation Daily   . sodium chloride      ASSESSMENT AND PLAN: Principal problem   Acute respiratory failure with hypoxia - oxygen saturation stable over past 24 hours 90-95% - continue O2 supplementation, maintain sat >90% - wants to establish care with pulmonology in [place], currently seen at [facility]  Active Problems:   Atrial tachycardia - seen by EP 04/12, not felt to be good candidate for EP procedures given significant lung disease  - HR stable over past 24 hours - continue medical management with PO cardizem, heart rate is acceptable    Acute diastolic heart failure - last echo 06/10/14 EF 55-60%, grade 2 diastolic dysfunction, PA pressure 48 mm Hg - weight today 251 (265 on admission 04/12), I/O net negative 4.7 L since admission - volume status improved on exam (no edema, JVD not elevated) - resume home lasix 20 mg      COPD / IPF (idiopathic pulmonary fibrosis) - continue dulera inhaler, spiriva inhaler - per IM/CCM    Hypokalemia - potassium 4.0 today (up from 3.6 yesterday s/p admin 40 mEq potassium) - replete as needed per IM    CAD - continue ASA, plavix, atorvastatin, lopressor - troponin 0.05 04/12, likely secondary to demand ischemia in the setting of atrial tach/SVT    DM - continue SSI - per IM    Hypothyroidism - continue levothyroxine  - per IM  Signed, Theodore Demark , PA-C 11:42 AM 02/04/2015  Attending Note:   The patient was seen and  examined.  Agree with assessment and plan as noted above.  Changes made to the above note as needed.  Pt is doing well on current meds. Continue same meds  Overall doing well from a cardiac standpoint   Vesta Mixer, Montez Hageman., MD, [facility] 02/04/2015, 12:13 PM [address]  [address] Office [phone] Pager [phone]

## 2015-02-04 NOTE — Progress Notes (Signed)
PULMONARY / CRITICAL CARE MEDICINE   Name: Jerry Montgomery MRN: [id number] DOB: 1955/12/18    ADMISSION DATE:  02/02/2015  REFERRING MD :  Dr. Juleen China   CHIEF COMPLAINT:  Hypoxic Respiratory Failure   INITIAL PRESENTATION:  59 y/o M, smoker, with PMH of morbid obesity, DM, CAD s/p stent, HTN, HLD, hypothyroidism. Atrial tachycardia, OSA, chronic respiratory failure on the basis of severe pulmonary fibrosis and COPD. Admitted on 4/12 w/ 4 day h/o increased dyspnea and fatigue. On presentation found to be hypoxic w/ O2 sats in 60s on 4 liters and HR in 170-180s.  PCCM asked to admit  STUDIES:    SIGNIFICANT EVENTS: 4/12  Admit with SVT, hypoxia  SUBJECTIVE: Pt reports breathing is improved, at 3L O2 (baseline).  No acute events.     VITAL SIGNS: Temp:  [97.4 F (36.3 C)-98.2 F (36.8 C)] 97.4 F (36.3 C) (04/14 0621) Pulse Rate:  [63-106] 68 (04/14 0818) Resp:  [20-31] 20 (04/14 0818) BP: (108-137)/(50-98) 121/77 mmHg (04/14 0818) SpO2:  [90 %-94 %] 91 % (04/14 0818) Weight:  [251 lb 3.2 oz (113.944 kg)-254 lb 4.8 oz (115.35 kg)] 251 lb 3.2 oz (113.944 kg) (04/14 9147)   INTAKE / OUTPUT:  Intake/Output Summary (Last 24 hours) at 02/04/15 0831 Last data filed at 02/04/15 0636  Gross per 24 hour  Intake    855 ml  Output   2175 ml  Net  -1320 ml    PHYSICAL EXAMINATION: Gen: obese male in NAD  HENT: OP clear,  neck supple Eyes: EOMi, sclera anicteric PULM: tachypnea, no distress, lungs bilaterally with crackles CV: Irreg irreg, no mgr, no JVD, no edema in legs GI: BS+, soft, nontender Derm: no cyanosis or rash Psyche: normal mood and affect Neuro: A&Ox4, maew   LABS:  CBC  Recent Labs Lab 02/02/15 1250  WBC 8.0  HGB 13.6  HCT 41.5  PLT 190   BMET  Recent Labs Lab 02/02/15 1250 02/03/15 0250 02/04/15 0509  NA 142 140 141  K 4.5 3.6 4.0  CL 106 104 103  CO2 BUN 25* 21 22  CREATININE 1.58* 1.48* 1.54*  GLUCOSE 218* 190* 170*    Glucose  Recent Labs Lab 02/03/15 0433 02/03/15 0722 02/03/15 1115 02/03/15 1711 02/03/15 2000 02/04/15 0617  GLUCAP 188* 175* 231* 323* 231* 170*    Imaging 4/13 CXR >> chronic fibrotic change, likely superimposed edema   ASSESSMENT / PLAN:  PULMONARY A: Acute on chronic Hypoxic Respiratory Failure: suspect element of edema exacerbated by AF and decompensated HF superimposed on ILD > improved, still with higher than baseline O2 requirements Pulmonary Fibrosis f/b [facility]. Failed Pirfenidone> he wants to come back to [employer] COPD: not currently in exacerbation  P:   Wean O2 for O2 saturation > 90% PRN xopenx, Scheduled Dulera F/u arranged with Dr. [name] for his IPF May be candidate for clinical trial for IPF PT evaluation  CARDIOVASCULAR CVL A:  SVT/Atrial Tachycardia with Aberrant Conduction - rate controlled 4/13 Hx Atrial Tachycardia  Grade 2 diastolic dysfunction  CAD P:  PO Diltiazem Avoid amio given ILD Will defer decision on ECHO to Cardiology He is on ASA; will need to decide on long term anticoagulation (OK from my standpoint) Transfer to tele bed  RENAL A:   Acute Kidney Injury > improved post diuresis Hypokalemia P:   Monitor BMET and UOP Replace electrolytes as needed  FAMILY  - Updates: patient updated at bedside.  Needs decision  regarding anti-coagulation & PT eval.  Arranged Pulm follow up.   Canary BrimBrandi Ollis, NP-C [facility] Pgr: [phone] or [phone]  59 year old male with IPF presenting to the hospital with a-fib with RVR,a cute pulmonary edema and hypoxemic respiratory failure.  I reviewed the CXR myself and evidence of pulmonary edema persists but sig improvement.  Hypoxemic respiratory failure: due to IPF and pulmonary edema.  Diureses ordered.  Hold off steroids for now.  Rate control as ordered.  Bronchodilators.  Acute pulmonary edema: due to a-fib with RVR.  Rate controlled with current  medications.  Diureses as ordered.  A-fib with RVR: Rate control via PO dilt.  ASA.  Will discuss anti-coagulation.  Acute renal failure: deteriorating with diureses but needs to be diuresed more for pulmonary edema.  F/U arrange f/u with Dr. [name] in the office as above.  PCCM will sign off, please call back if needed.  Patient seen and examined, agree with above note.  I dictated the care and orders written for this patient under my direction.  Alyson ReedyWesam G Yacoub, MD [phone]  02/04/2015, 8:31 AM

## 2015-02-05 ENCOUNTER — Inpatient Hospital Stay (HOSPITAL_COMMUNITY): Payer: Medicare Other

## 2015-02-05 DIAGNOSIS — E119 Type 2 diabetes mellitus without complications: Secondary | ICD-10-CM

## 2015-02-05 DIAGNOSIS — IMO0001 Reserved for inherently not codable concepts without codable children: Secondary | ICD-10-CM

## 2015-02-05 DIAGNOSIS — Z794 Long term (current) use of insulin: Secondary | ICD-10-CM

## 2015-02-05 LAB — GLUCOSE, CAPILLARY
Glucose-Capillary: 168 mg/dL — ABNORMAL HIGH (ref 70–99)
Glucose-Capillary: 275 mg/dL — ABNORMAL HIGH (ref 70–99)
Glucose-Capillary: 212 mg/dL — ABNORMAL HIGH (ref 70–99)

## 2015-02-05 LAB — BASIC METABOLIC PANEL
Anion gap: 11 (ref 5–15)
BUN: 19 mg/dL (ref 6–23)
CALCIUM: 8.9 mg/dL (ref 8.4–10.5)
CO2: 24 mmol/L (ref 19–32)
CREATININE: 1.33 mg/dL (ref 0.50–1.35)
Chloride: 103 mmol/L (ref 96–112)
GFR calc Af Amer: 65 mL/min — ABNORMAL LOW (ref >90)
GFR calc non Af Amer: 56 mL/min — ABNORMAL LOW (ref >90)
Glucose, Bld: 187 mg/dL — ABNORMAL HIGH (ref 70–99)
Potassium: 3.8 mmol/L (ref 3.5–5.1)
Sodium: 138 mmol/L (ref 135–145)

## 2015-02-05 LAB — CBC
HEMATOCRIT: 42.3 % (ref 39.0–52.0)
HEMOGLOBIN: 14.2 g/dL (ref 13.0–17.0)
MCH: 29.6 pg (ref 26.0–34.0)
MCHC: 33.6 g/dL (ref 30.0–36.0)
MCV: 88.3 fL (ref 78.0–100.0)
PLATELETS: 201 10*3/uL (ref 150–400)
RBC: 4.79 MIL/uL (ref 4.22–5.81)
RDW: 13.6 % (ref 11.5–15.5)
WBC: 7.6 10*3/uL (ref 4.0–10.5)

## 2015-02-05 LAB — MAGNESIUM: Magnesium: 1.5 mg/dL (ref 1.5–2.5)

## 2015-02-05 MED ORDER — DILTIAZEM HCL 30 MG PO TABS: 30.0000 mg | ORAL_TABLET | Freq: Four times a day (QID) | ORAL | Status: DC

## 2015-02-05 MED ORDER — INSULIN ASPART 100 UNIT/ML ~~LOC~~ SOLN
6.0000 [IU] | Freq: Three times a day (TID) | SUBCUTANEOUS | Status: DC
  Administered 2015-02-05: 6 [IU] via SUBCUTANEOUS

## 2015-02-05 MED ORDER — POTASSIUM CHLORIDE CRYS ER 20 MEQ PO TBCR
20.0000 meq | EXTENDED_RELEASE_TABLET | Freq: Every day | ORAL | Status: DC
  Administered 2015-02-05: 20 meq via ORAL
  Filled 2015-02-05: qty 1

## 2015-02-05 MED ORDER — INSULIN GLARGINE 100 UNIT/ML ~~LOC~~ SOLN
20.0000 [IU] | Freq: Every day | SUBCUTANEOUS | Status: DC
  Filled 2015-02-05: qty 0.2

## 2015-02-05 MED ORDER — MAGNESIUM SULFATE 2 GM/50ML IV SOLN
2.0000 g | Freq: Once | INTRAVENOUS | Status: AC
  Administered 2015-02-05: 2 g via INTRAVENOUS
  Filled 2015-02-05: qty 50

## 2015-02-05 MED ORDER — METOPROLOL SUCCINATE ER 200 MG PO TB24: 200.0000 mg | ORAL_TABLET | Freq: Every day | ORAL | Status: DC

## 2015-02-05 NOTE — Discharge Summary (Signed)
Physician Discharge Summary  Jerry Montgomery:[id number] DOB: [birth year] DOA: 02/02/2015  PCP: [name], MD  Admit date: 02/02/2015 Discharge date: 02/05/2015  Time spent: 20 minutes  Recommendations for Outpatient Follow-up:  1. Follow up with PCP in 1-2 weeks 2. Follow up with Cardiology as scheduled  Discharge Diagnoses:  Principal Problem:   Acute respiratory failure with hypoxia Active Problems:   COPD II   SVT (supraventricular tachycardia)   Acute diastolic heart failure   Atrial tachycardia   Hypokalemia   IPF (idiopathic pulmonary fibrosis)   Atrial fibrillation with RVR   Acute renal failure syndrome   Acute pulmonary edema   Acute heart failure   Acute respiratory failure   Diabetes mellitus type 2 without retinopathy   Discharge Condition: Improved  Diet recommendation: Heart healthy, diabetic  Filed Weights   02/02/15 1800 02/03/15 1202 02/04/15 0621  Weight: 120.3 kg (265 lb 3.4 oz) 115.35 kg (254 lb 4.8 oz) 113.944 kg (251 lb 3.2 oz)    History of present illness:  Please see admit h and p from 4/12 for details. Briefly, pt presented with hypoxic respiratory failure in the setting of pulmonary fibrosis. Pt was admitted for further work up.  Hospital Course:  1. Hypoxic respiratory failure secondary to ILD and acutely decompensated diastolic CHF 1. Pulmonary and Cardiology following 2. Patient's O2 weaned to his baseline 3LNC 3. Continued on PRN xopenex and Dulera 4. Ultimately, pt is to follow up with Dr. [name] for IPF and consideration for possible clinical trial 5. Pt to resume home lasix at  daily. Net neg 4.9L since admit 6. Wt 120.3kg on admit 2. SVT/Atrial Tach 1. Presently rate controlled but noted to be tachycardic overnight 2. Additional potassium ordered per Cardiology 3. Magnesium is low at 1.5. Given 2gm magnesium sulfate 4. Cardiology following 5. On cardizem PO 6. Discussed case with Dr. [name] of EP who recommends  to continue current med regimen with no further invasive procedures. Pt is cleared per Cardiology with close outpatient follow up 3. AKI 1. Cr remainied stable 2. Cont to monitor renal function 4. Morbid obesity 1. Advise wt loss 5. DM 1. Glucose poorly controlled, in the 200's 2. Appreciate input by Diabetic Coordinator 3. Increased lantus to 20 units and premeal aspart at 6 units while as inpatient 4. Cont SSI coverage 5. Recent a1c of 7.6, improved from 8.4, thus pt instructed to resume home DM regimen on discharge 6. DVT prophylaxis 1. Heparin subQ  Consultations:  Cardiology  Pulmonary  EP  Discharge Exam: Filed Vitals:   02/05/15 0936 02/05/15 1029 02/05/15 1251 02/05/15 1430  BP: 108/70   118/71  Pulse: 52  72   Temp:      TempSrc:      Resp: 14     Height:      Weight:      SpO2: 94% 92%      General: awake, in nad Cardiovascular: regular, s1, s2 Respiratory: normal resp effort, no wheezing  Discharge Instructions     Medication List    STOP taking these medications        glyBURIDE 5 MG tablet  Commonly known as:  DIABETA     losartan 100 MG tablet  Commonly known as:  COZAAR      TAKE these medications        ACCU-CHEK SOFTCLIX LANCETS lancets  USE AS INSTRUCTED     ADVAIR HFA 115-21 MCG/ACT inhaler  Generic drug:  fluticasone-salmeterol  Inhale 2  puffs into the lungs 2 (two) times daily.     albuterol 108 (90 BASE) MCG/ACT inhaler  Commonly known as:  PROVENTIL HFA;VENTOLIN HFA  Inhale 2 puffs into the lungs every 6 (six) hours as needed for wheezing.     ALPRAZolam 0.5 MG tablet  Commonly known as:  XANAX  Take 1 tablet (0.5 mg total) by mouth 3 (three) times daily as needed for anxiety.     aspirin 81 MG tablet  Take 81 mg by mouth daily.     atorvastatin 40 MG tablet  Commonly known as:  LIPITOR  Take 1 tablet by mouth  every night at bedtime     B-D ULTRAFINE III SHORT PEN 31G X 8 MM Misc  Generic drug:  Insulin Pen Needle   Use as directed     clopidogrel 75 MG tablet  Commonly known as:  PLAVIX  Take 1 tablet by mouth  every day     diltiazem 30 MG tablet  Commonly known as:  CARDIZEM  Take 1 tablet (30 mg total) by mouth every 6 (six) hours.     furosemide 20 MG tablet  Commonly known as:  LASIX  Take 20 mg by mouth daily as needed (fluid).     glucose blood test strip  Commonly known as:  ACCU-CHEK AVIVA PLUS  Check bs 4-5 times per day DX-250.00     insulin lispro 100 UNIT/ML KiwkPen  Commonly known as:  HUMALOG  Inject 10units into the skin with breakfast and lunch, and inject 20units into the skin with dinner.     isosorbide mononitrate 60 MG 24 hr tablet  Commonly known as:  IMDUR  Take 1 and 1/2 tablets by  mouth every day     LANTUS SOLOSTAR 100 UNIT/ML Solostar Pen  Generic drug:  Insulin Glargine  Inject subcutaneously 65  units every night at  bedtime     levothyroxine 125 MCG tablet  Commonly known as:  SYNTHROID, LEVOTHROID  Take 1 tablet (125 mcg total) by mouth daily.     linagliptin 5 MG Tabs tablet  Commonly known as:  TRADJENTA  Take 1 tablet (5 mg total) by mouth daily.     metFORMIN 850 MG tablet  Commonly known as:  GLUCOPHAGE  Take 1 tablet by mouth  twice a day     metoprolol 200 MG 24 hr tablet  Commonly known as:  TOPROL-XL  Take 1 tablet (200 mg total) by mouth daily. Take with or immediately following a meal.     niacin 1000 MG CR tablet  Commonly known as:  NIASPAN  Take 1 tablet by mouth  every day     nitroGLYCERIN 0.4 MG SL tablet  Commonly known as:  NITROSTAT  Place 0.4 mg under the tongue every 5 (five) minutes as needed for chest pain.     SYRINGE/NEEDLE (DISP) 1 ML 25G X 5/8" 1 ML Misc  Commonly known as:  B-D SYRINGE/NEEDLE 1CC/25GX5/8  As directed     tiotropium 18 MCG inhalation capsule  Commonly known as:  SPIRIVA  Place 1 capsule into inhaler and inhale daily.     zolpidem 10 MG tablet  Commonly known as:  AMBIEN  Take 1 tablet  (10 mg total) by mouth at bedtime as needed.       Allergies  Allergen Reactions  . Altace [Ramipril]     Cough    Follow-up Information    Follow up with [facility], MD On 03/09/2015.   Specialty:  Pulmonary Disease   Why:  Appt at 3:00 PM    Contact information:   [address] [place] [address] [phone]       Follow up with [facility] TOM, MD. Schedule an appointment as soon as possible for a visit in 1 week.   Specialty:  Family Medicine   Contact information:   [address] [place] [address] [phone]       Schedule an appointment as soon as possible for a visit with Lewayne Bunting, MD.   Specialty:  Cardiology   Why:  Hospital follow up   Contact information:   [address] [place] [address] [phone]        The results of significant diagnostics from this hospitalization (including imaging, microbiology, ancillary and laboratory) are listed below for reference.    Significant Diagnostic Studies: Dg Chest Port 1 View  02/05/2015   CLINICAL DATA:  Acute respiratory failure/ shortness of breath  EXAM: PORTABLE CHEST - 1 VIEW  COMPARISON:  February 03, 2015  FINDINGS: Interstitial and alveolar opacity bilaterally remain. Heart is mildly enlarged with pulmonary venous hypertension. No pneumothorax. No adenopathy.  IMPRESSION: Suspect congestive heart failure, although superimposed pneumonia cannot be excluded. Both entities may exist concurrently. No appreciable change compared to study obtained 2 days prior.   Electronically Signed   By: Bretta Bang III M.D.   On: 02/05/2015 08:19   Dg Chest Port 1 View  02/03/2015   CLINICAL DATA:  59 year old male with acute respiratory failure. Initial encounter.  EXAM: PORTABLE CHEST - 1 VIEW  COMPARISON:  02/02/2015 and earlier.  FINDINGS: Portable AP semi upright view at 0556 hours. Stable lung volumes and ventilation since yesterday with widespread combined interstitial  and airspace opacity. Stable cardiomegaly and mediastinal contours. No pneumothorax.  IMPRESSION: Stable ventilation since yesterday. Top differential considerations remain pulmonary edema and bilateral pneumonia.   Electronically Signed   By: Odessa Fleming M.D.   On: 02/03/2015 08:02   Dg Chest Portable 1 View  02/02/2015   CLINICAL DATA:  59 year old male with irregular heart rate and shortness of Breath for several days. Initial encounter.  EXAM: PORTABLE CHEST - 1 VIEW  COMPARISON:  06/05/2013.  FINDINGS: Portable AP view at 1420 hours. Diffuse increased pulmonary opacity, mixed interstitial and airspace. Resuscitation pads project over the left chest. Mildly lower lung volumes. Stable cardiomegaly and mediastinal contours. No pneumothorax. No large pleural effusion.  IMPRESSION: Diffuse increased bilateral pulmonary opacity, top considerations are bilateral pneumonia and acute pulmonary edema.   Electronically Signed   By: Odessa Fleming M.D.   On: 02/02/2015 15:41    Microbiology: Recent Results (from the past 240 hour(s))  MRSA PCR Screening     Status: None   Collection Time: 02/02/15  6:00 PM  Result Value Ref Range Status   MRSA by PCR NEGATIVE NEGATIVE Final    Comment:        The GeneXpert MRSA Assay (FDA approved for NASAL specimens only), is one component of a comprehensive MRSA colonization surveillance program. It is not intended to diagnose MRSA infection nor to guide or monitor treatment for MRSA infections.      Labs: Basic Metabolic Panel:  Recent Labs Lab 02/02/15 1250 02/03/15 0250 02/04/15 0509 02/05/15 0353  NA 142 140 141 138  K 4.5 3.6 4.0 3.8  CL 106 104 103 103  CO2 GLUCOSE 218* 190* 170* 187*  BUN 25*  21 22 19   CREATININE 1.58* 1.48* 1.54* 1.33  CALCIUM 9.1 8.8 8.9 8.9  MG 1.6  --   --  1.5   Liver Function Tests:  Recent Labs Lab 02/03/15 0250  AST 19  ALT 22  ALKPHOS 52  BILITOT 1.4*  PROT 6.4  ALBUMIN 3.3*   No results for  input(s): LIPASE, AMYLASE in the last 168 hours. No results for input(s): AMMONIA in the last 168 hours. CBC:  Recent Labs Lab 02/02/15 1250 02/05/15 0353  WBC 8.0 7.6  NEUTROABS 5.7  --   HGB 13.6 14.2  HCT 41.5 42.3  MCV 89.6 88.3  PLT 190 201   Cardiac Enzymes:  Recent Labs Lab 02/02/15 1250  TROPONINI 0.05*   BNP: BNP (last 3 results) No results for input(s): BNP in the last 8760 hours.  ProBNP (last 3 results) No results for input(s): PROBNP in the last 8760 hours.  CBG:  Recent Labs Lab 02/04/15 1625 02/04/15 2159 02/05/15 0629 02/05/15 1101 02/05/15 1655  GLUCAP 315* 232* 168* 275* 212*    Signed:  Georjean Toya K  [facility] 02/05/2015, 6:02 PM

## 2015-02-05 NOTE — Progress Notes (Signed)
OT Cancellation Note  Patient Details Name: Jerry NewtonRobert S Kratochvil MRN: [id number] DOB: 07/25/1956   Cancelled Treatment:    Reason Eval/Treat Not Completed: OT screened, no needs identified, will sign off. In to speak with pt who states he does not foresee any issues with BADLs, eval not completed, we will sign off.  Evette GeorgesLeonard, [name] Eva [phone] 02/05/2015, 1:37 PM

## 2015-02-05 NOTE — Progress Notes (Signed)
Follow up note for tachycardia  Subjective: No complaints, feels well  Objective: Vital signs in last 24 hours: Temp:  [97.2 F (36.2 C)-98.9 F (37.2 C)] 97.2 F (36.2 C) (04/15 0628) Pulse Rate:  [49-88] 52 (04/15 0936) Resp:  [14-20] 14 (04/15 0936) BP: (108-129)/(70-91) 108/70 mmHg (04/15 0936) SpO2:  [91 %-98 %] 92 % (04/15 1029) Weight change:  Last BM Date: 02/03/15 Intake/Output from previous day: -595 04/14 0701 - 04/15 0700 In: 1320 [P.O.:1320] Out: 1675 [Urine:1675] Intake/Output this shift: Total I/O In: 480 [P.O.:480] Out: 350 [Urine:350]  PE: General:Pleasant affect, NAD Skin:Warm and dry, brisk capillary refill HEENT:normocephalic, sclera clear, mucus membranes moist Neck:supple, no JVD, no bruits  Heart:S1S2 RRR without murmur, gallup, rub or click Lungs:clear without rales, rhonchi, or wheezes WJX:BJYNAbd:soft, non tender, + BS, do not palpate liver spleen or masses Ext:no lower ext edema, 2+ pedal pulses, 2+ radial pulses Neuro:alert and oriented, MAE, follows commands, + facial symmetry   Lab Results:  Recent Labs  02/02/15 1250 02/05/15 0353  WBC 8.0 7.6  HGB 13.6 14.2  HCT 41.5 42.3  PLT 190 201   BMET  Recent Labs  02/04/15 0509 02/05/15 0353  NA 141 138  K 4.0 3.8  CL 103 103  CO2 28 24  GLUCOSE 170* 187*  BUN 22 19  CREATININE 1.54* 1.33  CALCIUM 8.9 8.9    Recent Labs  02/02/15 1250  TROPONINI 0.05*    Lab Results  Component Value Date   CHOL 124 01/01/2015   HDL 38* 01/01/2015   LDLCALC 64 01/01/2015   TRIG 109 01/01/2015   CHOLHDL 3.3 01/01/2015   Lab Results  Component Value Date   HGBA1C 7.6* 01/01/2015     Lab Results  Component Value Date   TSH 1.933 02/02/2015    Hepatic Function Panel  Recent Labs  02/03/15 0250  PROT 6.4  ALBUMIN 3.3*  AST 19  ALT 22  ALKPHOS 52  BILITOT 1.4*   No results for input(s): CHOL in the last 72 hours. No results for input(s): PROTIME in the last 72  hours.     Studies/Results: Dg Chest Port 1 View  02/05/2015   CLINICAL DATA:  Acute respiratory failure/ shortness of breath  EXAM: PORTABLE CHEST - 1 VIEW  COMPARISON:  February 03, 2015  FINDINGS: Interstitial and alveolar opacity bilaterally remain. Heart is mildly enlarged with pulmonary venous hypertension. No pneumothorax. No adenopathy.  IMPRESSION: Suspect congestive heart failure, although superimposed pneumonia cannot be excluded. Both entities may exist concurrently. No appreciable change compared to study obtained 2 days prior.   Electronically Signed   By: Bretta BangWilliam  Woodruff III M.D.   On: 02/05/2015 08:19   ECHO from 05/2014  Left ventricle: The cavity size was normal. Wall thickness was normal. Systolic function was normal. The estimated ejection fraction was in the range of 55% to 60%. Hypokinesis of the basalinferolateral and inferior myocardium. Features are consistent with a pseudonormal left ventricular filling pattern, with concomitant abnormal relaxation and increased filling pressure (grade 2 diastolic dysfunction). - Left atrium: The atrium was moderately dilated. - Pulmonary arteries: Systolic pressure was moderately increased. PA peak pressure: 48 mm Hg (S). Impressions: - Compared to 2011, there are new wall motion abnormalities in the left circumflex coronary artery territory and signs of worsening diastolic function with elevated mean LA pressure and moderate pulmonary hypertension.  Medications: I have reviewed the patient's current medications. Scheduled Meds: . aspirin EC  81  mg Oral Daily  . atorvastatin  40 mg Oral q1800  . clopidogrel  75 mg Oral Daily  . diltiazem  30 mg Oral 4 times per day  . furosemide  20 mg Oral Daily  . heparin  5,000 Units Subcutaneous 3 times per day  . insulin aspart  0-9 Units Subcutaneous TID WC & HS  . insulin aspart  4 Units Subcutaneous TID WC  . insulin glargine  15 Units Subcutaneous QHS  .  levothyroxine  125 mcg Oral QAC breakfast  . metoprolol  200 mg Oral Daily  . mometasone-formoterol  2 puff Inhalation BID  . potassium chloride  20 mEq Oral Daily  . tiotropium  18 mcg Inhalation Daily   Continuous Infusions: . sodium chloride     PRN Meds:.acetaminophen **OR** acetaminophen, ALPRAZolam, levalbuterol, ondansetron **OR** ondansetron (ZOFRAN) IV, zolpidem  Assessment/Plan: 59 y.o. male with a past medical history significant for CAD (s/p LAD and prox circ stenting), hypertension, hyperlipidemia, type II diabetes, and pulmonary fibrosis (on home O2). He was seen in his PCP's office 02/02/15 with increased shortness of breath and frequent atrial ectopy concerning for AF on EKG and admitted.  Pt seen by Dr. Johney Frame on the 12th.  DX of Atrial tach with and without aberrancy.  Now with NSVT (non sustained ventricular tachycardia) as well.    1. Atrial tach In the setting of acute respiratory failure with underlying pulmonary fibrosis. Would avoid amiodarone with underlying lung disease.  IV Cardizem has been changed to po. He is a poor candidate for EP procedures with significant lung disease. Tachycardia improved.  Around midnight  he had further atrial tach rate 155 with aberrancy and without. Also runs of NSVT were noted yesterday.  Pt was not aware of tachycardia, even though he was awakened he has no recollection due to [place].    He believes his SOB is at baseline. He is on his normal 3 L [place] O2  --meds currently Toprol XL 200 mg daily and cardizem 30 mg QID-- at times SB at 52 also with SR with PACs.  Now at baseline did the tachycardiac cause some of the respiratory failure?  Dr. [name] to see.   No need for anticoagulation, no atrial fib noted.  2. Pulmonary fibrosis/acute respiratory distress with acute on chronic hypoxic Respiratory failure, with CHF.   Pt has significant respiratory distress and has had increased shortness of breath for the last 4 days. His pulmonary  fibrosis is followed at [facility]. He would like to establish with pulmonary in [place]. -5074 since admit. Wt decreased from 265 to 251.    3. CAD No recent ischemic symptoms Continue ASA/Plavix/BB     LOS: 3 days   Time spent with pt. : 15 minutes. [facility] R  Nurse Practitioner Certified Pager [phone] or after 5pm and on weekends call [phone] 02/05/2015, 12:27 PM   EP Attending  Patient seen and examined. Agree with above. He has had SVT for many years. He did not feel his SVT last night. He was in SVT when he presented to hospital but I think the SVT is a secondary problem because last night he told me he was sleeping in SVT and did not know he was out of rhythm. For now, continue his beta blocker and calcium channel blocker.   Jerry Montgomery.D.

## 2015-02-05 NOTE — Progress Notes (Signed)
Physical Therapy Treatment/ DIscharge Patient Details Name: Jerry Montgomery MRN: [id number] DOB: 17-Dec-1955 Today's Date: 02/05/2015    History of Present Illness Patient is a 59 y/o male with PMH of morbid obesity, DM, CAD s/p stent, HTN, HLD, hypothyroidism, atrial tachycardia, OSA, chronic respiratory failure on the basis of severe pulmonary fibrosis and COPD presents with SOB and fatigue. Found to be hypoxic w/ O2 sats in 60s on 4 liters and HR in 170-180s. Admitted with a-fib with RVR, acute pulmonary edema and hypoxemic respiratory failure     PT Comments    Pt very pleasant and moving well. Pt on 3L O2 at home as well as currently unable to achieve pulse ox reading throughout session today but pt reports no SOB, able to ambulate long hall distance and perform mobility at baseline level. Pt with all goals met without further need for education or intervention at this time. Will sign off with pt aware and agreeable.   Follow Up Recommendations  No PT follow up     Equipment Recommendations  None recommended by PT    Recommendations for Other Services       Precautions / Restrictions Precautions Precautions: Fall Restrictions Weight Bearing Restrictions: No    Mobility  Bed Mobility Overal bed mobility: Modified Independent                Transfers Overall transfer level: Modified independent                  Ambulation/Gait Ambulation/Gait assistance: Modified independent (Device/Increase time) Ambulation Distance (Feet): 400 Feet Assistive device: None Gait Pattern/deviations: WFL(Within Functional Limits)   Gait velocity interpretation: at or above normal speed for age/gender     Stairs Stairs: Yes Stairs assistance: Modified independent (Device/Increase time) Stair Management: One rail Right;Step to pattern;Forwards Number of Stairs: 5    Wheelchair Mobility    Modified Rankin (Stroke Patients Only)       Balance Overall balance  assessment: No apparent balance deficits (not formally assessed);History of Falls                             High Level Balance Comments: pt does not have apparent balance deficits but does have a history of falls due to right knee buckling related to previous quadricep injury on that side    Cognition Arousal/Alertness: Awake/alert Behavior During Therapy: WFL for tasks assessed/performed Overall Cognitive Status: Within Functional Limits for tasks assessed                      Exercises      General Comments        Pertinent Vitals/Pain Pain Assessment: No/denies pain  HR 72    Home Living                      Prior Function            PT Goals (current goals can now be found in the care plan section) Progress towards PT goals: Goals met/education completed, patient discharged from PT    Frequency       PT Plan Discharge plan needs to be updated    Co-evaluation             End of Session   Activity Tolerance: Patient tolerated treatment well Patient left: in bed;with call bell/phone within reach (pt denied OOB to chair)     Time:  4370-0525 PT Time Calculation (min) (ACUTE ONLY): 16 min  Charges:  $Gait Training: 8-22 mins                    G Codes:      Melford Aase 09-Feb-2015, 12:53 PM Elwyn Reach, [place]

## 2015-02-05 NOTE — Progress Notes (Signed)
Around 1 am, this NP just happened to be on nursing unit. Pt's tele monitor showed SVT in the 155 range. NP to bedside. Pt was sleeping. Upon awakening, pt is alert, oriented and denies CP, palpitations, SOB or dizziness.  Asked pt to bear down x 2. EKG x 2 completed-1st one with SVT and 30 sec later, 2nd one with NSR.   Upon exiting room and going to desk, tele showed SVT again. However, HR comes down to normal after 30 secs or so. Was going to give metoprolol IV, but before dose, HR back to normal. Pt on Toprol and Cardizem po and has been seen by Cardio and EP cardio.  Pt with hx of atrial tachycardia and had some SVT earlier in hospitalization. Formerly, on Cardizem gtt and this was changed to po.  Given return to NSR in seconds with non sustained SVT and tachycardia, will continue to watch for now. Pt is completely asymptomatic.  Jimmye NormanKaren Kirby-Graham, NP [facility]

## 2015-02-05 NOTE — Progress Notes (Signed)
Pt. HR up in the 150s-60s, sustaining 30s-471min. Pt. Resting in bed and asymptomatic. VSS. EKG completed. Donnamarie PoagK. Kirby, NP at pts. Bedside. No new orders received at this time. RN will continue to monitor pt. For changes in condition. Yuliana Vandrunen, Cheryll DessertKaren Cherrell

## 2015-02-05 NOTE — Progress Notes (Signed)
Inpatient Diabetes Program Recommendations  AACE/ADA: New Consensus Statement on Inpatient Glycemic Control (2013)  Target Ranges:  Prepandial:   less than 140 mg/dL      Peak postprandial:   less than 180 mg/dL (1-2 hours)      Critically ill patients:  140 - 180 mg/dL   Reason for Visit: Hyperglycemia  Results for Jerry NewtonHIPPS, [place] S (MRN [id number]) as of 02/05/2015 13:01  Ref. Range 02/04/2015 11:44 02/04/2015 16:25 02/04/2015 21:59 02/05/2015 06:29 02/05/2015 11:01  Glucose-Capillary Latest Ref Range: 70-99 mg/dL 914297 (H) 782315 (H) 956232 (H) 168 (H) 275 (H)   Blood sugars slightly improved. Needs insulin titration.  Recommendations: Increase Lantus to 20 units QHS Increase meal coverage insulin to Novolog 6 units tidwc.  Will continue to follow. Thank you. Ailene Ardshonda Tran Arzuaga, RD, LDN, CDE Inpatient Diabetes Coordinator [phone]

## 2015-02-05 NOTE — Progress Notes (Signed)
[facility] PROGRESS NOTE  Jerry NewtonRobert S Montgomery AVW:[id number] DOB: 02/26/1956 DOA: 02/02/2015 PCP: [name],Jerry TOM, MD  Assessment/Plan: 1. Hypoxic respiratory failure secondary to ILD and acutely decompensated diastolic CHF 1. Pulmonary and Cardiology following 2. Cont to wean O2 as tolerated. Remains on 3LNC 3. Continued on PRN xopenex and Dulera 4. Ultimately, pt to follow up with Dr. [name] for IPF and consideration for possible clinical trial 5. Pt to resume home lasix at 20mg  daily. Net neg 4.9L since admit 6. Wt 120.3kg on admit 2. SVT/Atrial Tach 1. Presently rate controlled but noted to be tachycardic overnight 2. Additional potassium ordered per Cardiology 3. Magnesium is low at 1.5. Will give 2gm magnesium sulfate 4. Cardiology following 5. On cardizem PO 3. AKI 1. Cr remaining stable 2. Cont to monitor renal function 4. Morbid obesity 1. Advise wt loss 5. DM 1. Glucose poorly controlled, in the 200's 2. Appreciate input by Diabetic Coordinator 3. Increase lantus to 20 units and premeal aspart at 6 units 4. Cont SSI coverage 6. DVT prophylaxis 1. Heparin subQ   Code Status: Full Family Communication: Pt in room, daughter at bedside Disposition Plan: Pending   Consultants:  Cardiology  Critical Care  Procedures:    Antibiotics:  none  HPI/Subjective: Eager to go home. Denies sob  Objective: Filed Vitals:   02/05/15 0936 02/05/15 1029 02/05/15 1251 02/05/15 1430  BP: 108/70   118/71  Pulse: 52  72   Temp:      TempSrc:      Resp: 14     Height:      Weight:      SpO2: 94% 92%      Intake/Output Summary (Last 24 hours) at 02/05/15 1438 Last data filed at 02/05/15 1208  Gross per 24 hour  Intake   1200 ml  Output   1725 ml  Net   -525 ml   Filed Weights   02/02/15 1800 02/03/15 1202 02/04/15 0621  Weight: 120.3 kg (265 lb 3.4 oz) 115.35 kg (254 lb 4.8 oz) 113.944 kg (251 lb 3.2 oz)    Exam:   General:  Awake, laying in  bed, in nad  Cardiovascular: regular, s1, s2  Respiratory: slightly increased resp effort, decreased BS B  Abdomen: obese, nondistended, pos BS  Musculoskeletal: perfused, no clubbing   Data Reviewed: Basic Metabolic Panel:  Recent Labs Lab 02/02/15 1250 02/03/15 0250 02/04/15 0509 02/05/15 0353  NA 142 140 141 138  K 4.5 3.6 4.0 3.8  CL 106 104 103 103  CO2 25 23 28 24   GLUCOSE 218* 190* 170* 187*  BUN 25* 21 22 19   CREATININE 1.58* 1.48* 1.54* 1.33  CALCIUM 9.1 8.8 8.9 8.9  MG 1.6  --   --  1.5   Liver Function Tests:  Recent Labs Lab 02/03/15 0250  AST 19  ALT 22  ALKPHOS 52  BILITOT 1.4*  PROT 6.4  ALBUMIN 3.3*   No results for input(s): LIPASE, AMYLASE in the last 168 hours. No results for input(s): AMMONIA in the last 168 hours. CBC:  Recent Labs Lab 02/02/15 1250 02/05/15 0353  WBC 8.0 7.6  NEUTROABS 5.7  --   HGB 13.6 14.2  HCT 41.5 42.3  MCV 89.6 88.3  PLT 190 201   Cardiac Enzymes:  Recent Labs Lab 02/02/15 1250  TROPONINI 0.05*   BNP (last 3 results) No results for input(s): BNP in the last 8760 hours.  ProBNP (last 3 results) No results for input(s): PROBNP in  the last 8760 hours.  CBG:  Recent Labs Lab 02/04/15 1144 02/04/15 1625 02/04/15 2159 02/05/15 0629 02/05/15 1101  GLUCAP 297* 315* 232* 168* 275*    Recent Results (from the past 240 hour(s))  MRSA PCR Screening     Status: None   Collection Time: 02/02/15  6:00 PM  Result Value Ref Range Status   MRSA by PCR NEGATIVE NEGATIVE Final    Comment:        The GeneXpert MRSA Assay (FDA approved for NASAL specimens only), is one component of a comprehensive MRSA colonization surveillance program. It is not intended to diagnose MRSA infection nor to guide or monitor treatment for MRSA infections.      Studies: Dg Chest Port 1 View  02/05/2015   CLINICAL DATA:  Acute respiratory failure/ shortness of breath  EXAM: PORTABLE CHEST - 1 VIEW  COMPARISON:   February 03, 2015  FINDINGS: Interstitial and alveolar opacity bilaterally remain. Heart is mildly enlarged with pulmonary venous hypertension. No pneumothorax. No adenopathy.  IMPRESSION: Suspect congestive heart failure, although superimposed pneumonia cannot be excluded. Both entities may exist concurrently. No appreciable change compared to study obtained 2 days prior.   Electronically Signed   By: Bretta Bang III M.D.   On: 02/05/2015 08:19    Scheduled Meds: . aspirin EC  81 mg Oral Daily  . atorvastatin  40 mg Oral q1800  . clopidogrel  75 mg Oral Daily  . diltiazem  30 mg Oral 4 times per day  . furosemide  20 mg Oral Daily  . heparin  5,000 Units Subcutaneous 3 times per day  . insulin aspart  0-9 Units Subcutaneous TID WC & HS  . insulin aspart  6 Units Subcutaneous TID WC  . insulin glargine  20 Units Subcutaneous QHS  . levothyroxine  125 mcg Oral QAC breakfast  . magnesium sulfate 1 - 4 g bolus IVPB  2 g Intravenous Once  . metoprolol  200 mg Oral Daily  . mometasone-formoterol  2 puff Inhalation BID  . potassium chloride  20 mEq Oral Daily  . tiotropium  18 mcg Inhalation Daily   Continuous Infusions: . sodium chloride      Principal Problem:   Acute respiratory failure with hypoxia Active Problems:   COPD II   SVT (supraventricular tachycardia)   Acute diastolic heart failure   Atrial tachycardia   Hypokalemia   IPF (idiopathic pulmonary fibrosis)   Atrial fibrillation with RVR   Acute renal failure syndrome   Acute pulmonary edema   Acute heart failure    Jerry Montgomery K  [facility] Pager [phone]. If 7PM-7AM, please contact night-coverage at [email or web address], password [facility] 02/05/2015, 2:38 PM  LOS: 3 days

## 2015-02-05 NOTE — Progress Notes (Signed)
Patient Name: Charlotta NewtonRobert S Lasater Date of Encounter: 02/05/2015  Principal Problem:   Acute respiratory failure with hypoxia Active Problems:   Acute diastolic heart failure   COPD II   SVT (supraventricular tachycardia)   Atrial tachycardia   Hypokalemia   IPF (idiopathic pulmonary fibrosis)   Atrial fibrillation with RVR   Acute renal failure syndrome   Acute pulmonary edema   Acute heart failure    Primary Cardiologist: Dr. Tresa EndoKelly  Patient Profile: 59 yo male w/ hx of CAD (s/p LAD and prox circ stenting), HTN, HLD, DM, COPD, and pulmonary fibrosis on 3L O2, presented to ED 04/12 with worsening SOB and atrial tachycardia, initially admitted to ICU given hypoxic respiratory failure.  SUBJECTIVE: Overnight, was noted to have SVT to 155 on telemetry. Patient was asked to bear down x 2 and ECG was obtained x 2. Patient reports he takes ambien at night and does not remember this event. This morning, he denies chest pain, palpitations. He reports he feels like his shortness of breath is at baseline, and he is on his home 3L O2 requirement.  OBJECTIVE Filed Vitals:   02/04/15 2157 02/05/15 0002 02/05/15 0201 02/05/15 0628  BP: 129/91 112/76 128/78 113/76  Pulse: 49 82 88 62  Temp: 98 F (36.7 C)  98.6 F (37 C) 97.2 F (36.2 C)  TempSrc: Oral  Oral Oral  Resp: 18  20 18   Height:      Weight:      SpO2: 93% 94% 98% 92%    Intake/Output Summary (Last 24 hours) at 02/05/15 0851 Last data filed at 02/05/15 0844  Gross per 24 hour  Intake   1800 ml  Output   1675 ml  Net    125 ml   Filed Weights   02/02/15 1800 02/03/15 1202 02/04/15 0621  Weight: 265 lb 3.4 oz (120.3 kg) 254 lb 4.8 oz (115.35 kg) 251 lb 3.2 oz (113.944 kg)    PHYSICAL EXAM General: Well developed, well nourished, male in no acute distress. Head: Normocephalic, atraumatic.  Neck: Supple without bruits, JVD not elevated. Lungs: Resp regular, unlabored, rales in bases bilaterally. Heart: RRR, S1, S2,  no S3, S4, or murmur; no rub. Abdomen: Soft, non-tender, non-distended, BS + x 4.  Extremities: No clubbing, cyanosis, no edema.  Neuro: Alert and oriented X 3. Moves all extremities spontaneously. Psych: Normal affect.  LABS: CBC: Recent Labs  02/02/15 1250 02/05/15 0353  WBC 8.0 7.6  NEUTROABS 5.7  --   HGB 13.6 14.2  HCT 41.5 42.3  MCV 89.6 88.3  PLT 190 201   Basic Metabolic Panel: Recent Labs  02/02/15 1250  02/04/15 0509 02/05/15 0353  NA 142  < > 141 138  K 4.5  < > 4.0 3.8  CL 106  < > 103 103  CO2 25  < > 28 24  GLUCOSE 218*  < > 170* 187*  BUN 25*  < > 22 19  CREATININE 1.58*  < > 1.54* 1.33  CALCIUM 9.1  < > 8.9 8.9  MG 1.6  --   --   --   < > = values in this interval not displayed.  Liver Function Tests: Recent Labs  02/03/15 0250  AST 19  ALT 22  ALKPHOS 52  BILITOT 1.4*  PROT 6.4  ALBUMIN 3.3*   Cardiac Enzymes: Recent Labs  02/02/15 1250  TROPONINI 0.05*   Thyroid Function Tests: Recent Labs  02/02/15 1725  TSH 1.933  TELE:  SVT, 150s, possible v tach vs atrial tach/SVT with aberrant conduction       ECG:  04/15 at 00:03 SR, 67, occasional PVC 04/15 at 00:04 SVT, 157  Radiology/Studies:  Dg Chest Port 1 View 02/05/2015   CLINICAL DATA:  Acute respiratory failure/ shortness of breath  EXAM: PORTABLE CHEST - 1 VIEW  COMPARISON:  February 03, 2015  FINDINGS: Interstitial and alveolar opacity bilaterally remain. Heart is mildly enlarged with pulmonary venous hypertension. No pneumothorax. No adenopathy.  IMPRESSION: Suspect congestive heart failure, although superimposed pneumonia cannot be excluded. Both entities may exist concurrently. No appreciable change compared to study obtained 2 days prior.   Electronically Signed   By: Bretta Bang III M.D.   On: 02/05/2015 08:19    Current Medications:  . aspirin EC  81 mg Oral Daily  . atorvastatin  40 mg Oral q1800  . clopidogrel  75 mg Oral Daily  . diltiazem  30 mg Oral 4 times  per day  . furosemide  20 mg Oral Daily  . heparin  5,000 Units Subcutaneous 3 times per day  . insulin aspart  0-9 Units Subcutaneous TID WC & HS  . insulin aspart  4 Units Subcutaneous TID WC  . insulin glargine  15 Units Subcutaneous QHS  . levothyroxine  125 mcg Oral QAC breakfast  . metoprolol  200 mg Oral Daily  . mometasone-formoterol  2 puff Inhalation BID  . tiotropium  18 mcg Inhalation Daily   . sodium chloride      ASSESSMENT AND PLAN:  Principal Problem:   Acute respiratory failure with hypoxia - O2 saturation stable over past 24 hours 91-98% - continue O2 supplementation, maintain O2 >90% - wants to establish care with pulmonology in [place], currently seen at [facility]  Active Problems:   Atrial tachycardia   - seen by EP 04/12, not felt to be good candidate for EP procedures given chronic lung disease - baseline rate 65-70, tachycardic to 150s overnight, asymptomatic - telemetry demonstrates SVT, possible v tach vs atrial tach/SVT with aberrant conduction  - consult EP to evaluate telemetry - continue PO cardizem    Acute diastolic heart failure - last echo 06/10/14 EF 55-60%, garde 2 diastolic dysfunction, PA pressure 48 mm HG - weight 251 on 04/14 (down from 265 on admission 04/12), I/O net negative 4.6 L since admission - resume home lasix 20 mg PO daily    COPD / IPF (idiopathic pulmonary fibrosis) - continue dulera, spiriva inhalers - per IM/CCM      Hypokalemia - potassium 3.8 today (down from 4.0 yesterday) - replete with 20 mEq potassium chloride PO    CAD - continue ASA, statin, plavix - troponin 0.05 04/12, likely secondary to demand ischemia in the setting of atrial tach/SVT    DM - glucose management per IM      Hypothyroidism - continue levothyroxine - per IM    Acute renal failure syndrome - creatinine 1.33 today (down from 1.54 yesterday), BUN stable - per IM      Acute pulmonary edema - resume home lasix 20 mg PO daily      Signed, Theodore Demark , PA-C 8:51 AM 02/05/2015  Attending Note:   The patient was seen and examined.  Agree with assessment and plan as noted above.  Changes made to the above note as needed.   1. Pulmonary fibrosis:  Has baseline dyspnea - worse since last week.  2. Wide complex tachycardia:  ? VT vs  SVT with aberrancy. Will have EP review the strips.  He is toprol XL 200 a day and  cardizem  30 QID  is bradycardic at times.  We will probably not be able to increase his Dilt much more. EF is normal    He has underlying pulmonary fibrosis.  He probably is not a candidate for amiodarone.       Vesta Mixer, Montez Hageman., MD, [facility] 02/05/2015, 9:49 AM [address],  [address] Office [phone] Pager [phone]

## 2015-02-11 ENCOUNTER — Encounter: Payer: Self-pay | Admitting: Family Medicine

## 2015-02-11 ENCOUNTER — Ambulatory Visit (INDEPENDENT_AMBULATORY_CARE_PROVIDER_SITE_OTHER): Payer: Medicare Other | Admitting: Family Medicine

## 2015-02-11 VITALS — BP 122/78 | HR 78 | Temp 98.5°F | Resp 22 | Ht 73.0 in | Wt 255.0 lb

## 2015-02-11 DIAGNOSIS — J8410 Pulmonary fibrosis, unspecified: Secondary | ICD-10-CM

## 2015-02-11 DIAGNOSIS — Z09 Encounter for follow-up examination after completed treatment for conditions other than malignant neoplasm: Secondary | ICD-10-CM

## 2015-02-11 MED ORDER — FUROSEMIDE 20 MG PO TABS: 20.0000 mg | ORAL_TABLET | Freq: Every day | ORAL | Status: DC | PRN

## 2015-02-11 NOTE — Progress Notes (Signed)
Subjective:    Patient ID: Jerry NewtonRobert S Montgomery, male    DOB: 03/12/1956, 59 y.o.   MRN: [id number]  HPI See my last office visit. I will refer the patient to the emergency room due to shortness of breath, hypoxemia, and cardiac arrhythmia which I felt was A. fib with RVR. I have copied relevant portions of his discharge summary and included them below for my reference:  Admit date: 02/02/2015 Discharge date: 02/05/2015  Time spent: 20 minutes  Recommendations for Outpatient Follow-up:  1. Follow up with PCP in 1-2 weeks 2. Follow up with Cardiology as scheduled  Discharge Diagnoses:  Principal Problem:  Acute respiratory failure with hypoxia Active Problems:  COPD II  SVT (supraventricular tachycardia)  Acute diastolic heart failure  Atrial tachycardia  Hypokalemia  IPF (idiopathic pulmonary fibrosis)  Atrial fibrillation with RVR  Acute renal failure syndrome  Acute pulmonary edema  Acute heart failure  Acute respiratory failure  Diabetes mellitus type 2 without retinopathy   Discharge Condition: Improved  Diet recommendation: Heart healthy, diabetic  Filed Weights   02/02/15 1800 02/03/15 1202 02/04/15 0621  Weight: 120.3 kg (265 lb 3.4 oz) 115.35 kg (254 lb 4.8 oz) 113.944 kg (251 lb 3.2 oz)    History of present illness:  Please see admit h and p from 4/12 for details. Briefly, pt presented with hypoxic respiratory failure in the setting of pulmonary fibrosis. Pt was admitted for further work up.  Hospital Course:  1. Hypoxic respiratory failure secondary to ILD and acutely decompensated diastolic CHF 1. Pulmonary and Cardiology following 2. Patient's O2 weaned to his baseline 3LNC 3. Continued on PRN xopenex and Dulera 4. Ultimately, pt is to follow up with Dr. [name] for IPF and consideration for possible clinical trial 5. Pt to resume home lasix at 20mg  daily. Net neg 4.9L since admit 6. Wt 120.3kg on admit 2. SVT/Atrial  Tach 1. Presently rate controlled but noted to be tachycardic overnight 2. Additional potassium ordered per Cardiology 3. Magnesium is low at 1.5. Given 2gm magnesium sulfate 4. Cardiology following 5. On cardizem PO 6. Discussed case with Dr. Ladona Ridgelaylor of EP who recommends to continue current med regimen with no further invasive procedures. Pt is cleared per Cardiology with close outpatient follow up 3. AKI 1. Cr remainied stable 2. Cont to monitor renal function 4. Morbid obesity 1. Advise wt loss 5. DM 1. Glucose poorly controlled, in the 200's 2. Appreciate input by Diabetic Coordinator 3. Increased lantus to 20 units and premeal aspart at 6 units while as inpatient 4. Cont SSI coverage 5. Recent a1c of 7.6, improved from 8.4, thus pt instructed to resume home DM regimen on discharge 6. DVT prophylaxis 1. Heparin subQ  Consultations:  Cardiology  Pulmonary  EP  Discharge Exam: Filed Vitals:   02/05/15 0936 02/05/15 1029 02/05/15 1251 02/05/15 1430  BP: 108/70   118/71  Pulse: 52  72   Temp:      TempSrc:      Resp: 14     Height:      Weight:      SpO2: 94% 92%      General: awake, in nad Cardiovascular: regular, s1, s2 Respiratory: normal resp effort, no wheezing  Discharge Instructions     Medication List    STOP taking these medications       glyBURIDE 5 MG tablet  Commonly known as: DIABETA     losartan 100 MG tablet  Commonly known as: COZAAR  TAKE these medications       ACCU-CHEK SOFTCLIX LANCETS lancets  USE AS INSTRUCTED     ADVAIR HFA 115-21 MCG/ACT inhaler  Generic drug: fluticasone-salmeterol  Inhale 2 puffs into the lungs 2 (two) times daily.     albuterol 108 (90 BASE) MCG/ACT inhaler  Commonly known as: PROVENTIL HFA;VENTOLIN HFA  Inhale 2 puffs into the lungs every 6 (six) hours as needed for wheezing.     ALPRAZolam 0.5 MG tablet   Commonly known as: XANAX  Take 1 tablet (0.5 mg total) by mouth 3 (three) times daily as needed for anxiety.     aspirin 81 MG tablet  Take 81 mg by mouth daily.     atorvastatin 40 MG tablet  Commonly known as: LIPITOR  Take 1 tablet by mouth every night at bedtime     B-D ULTRAFINE III SHORT PEN 31G X 8 MM Misc  Generic drug: Insulin Pen Needle  Use as directed     clopidogrel 75 MG tablet  Commonly known as: PLAVIX  Take 1 tablet by mouth every day     diltiazem 30 MG tablet  Commonly known as: CARDIZEM  Take 1 tablet (30 mg total) by mouth every 6 (six) hours.     furosemide 20 MG tablet  Commonly known as: LASIX  Take 20 mg by mouth daily as needed (fluid).     glucose blood test strip  Commonly known as: ACCU-CHEK AVIVA PLUS  Check bs 4-5 times per day DX-250.00     insulin lispro 100 UNIT/ML KiwkPen  Commonly known as: HUMALOG  Inject 10units into the skin with breakfast and lunch, and inject 20units into the skin with dinner.     isosorbide mononitrate 60 MG 24 hr tablet  Commonly known as: IMDUR  Take 1 and 1/2 tablets by mouth every day     LANTUS SOLOSTAR 100 UNIT/ML Solostar Pen  Generic drug: Insulin Glargine  Inject subcutaneously 65 units every night at bedtime     levothyroxine 125 MCG tablet  Commonly known as: SYNTHROID, LEVOTHROID  Take 1 tablet (125 mcg total) by mouth daily.     linagliptin 5 MG Tabs tablet  Commonly known as: TRADJENTA  Take 1 tablet (5 mg total) by mouth daily.     metFORMIN 850 MG tablet  Commonly known as: GLUCOPHAGE  Take 1 tablet by mouth twice a day     metoprolol 200 MG 24 hr tablet  Commonly known as: TOPROL-XL  Take 1 tablet (200 mg total) by mouth daily. Take with or immediately following a meal.     niacin 1000 MG CR tablet  Commonly known as: NIASPAN  Take 1 tablet by mouth every day     nitroGLYCERIN 0.4  MG SL tablet  Commonly known as: NITROSTAT  Place 0.4 mg under the tongue every 5 (five) minutes as needed for chest pain.     SYRINGE/NEEDLE (DISP) 1 ML 25G X 5/8" 1 ML Misc  Commonly known as: B-D SYRINGE/NEEDLE 1CC/25GX5/8  As directed     tiotropium 18 MCG inhalation capsule  Commonly known as: SPIRIVA  Place 1 capsule into inhaler and inhale daily.     zolpidem 10 MG tablet  Commonly known as: AMBIEN  Take 1 tablet (10 mg total) by mouth at bedtime as needed.           Since discharge from the hospital the patient is doing relatively well. He is back to his baseline. His discharge weight from  the hospital was 251 pounds. However this was with no clothes on. Here today wearing shoes and in street clothes [place] 255 pounds. There is no pitting edema on his exam today. Patient has fine crackles in both lungs but this is chronic and due to his pulmonary fibrosis. I hear no evidence of pulmonary edema and there is no sign of JVD today on examination. Today he is in regular sinus rhythm with well-controlled heart rate at 78 bpm. He does not appear to be fluid overloaded. Past Medical History  Diagnosis Date  . Diabetes mellitus   . Hypertension   . Hyperlipidemia   . Postinflammatory pulmonary fibrosis   . CAD (coronary artery disease)   . Pneumothorax on right 4/11  . Sleep apnea     does not use CPAP   . COPD (chronic obstructive pulmonary disease)   . Atrial tachycardia   . Acute diastolic heart failure    Past Surgical History  Procedure Laterality Date  . Coronary stent placement  03/05/2003    PCI & Stent  LAD & mid CX  . Elbow surgery    . Rotator cuff repair    . Bilateral vats ablation      rt  . Cardiac catheterization  05/03/2007    patent stents  . Right knee surgery       2012   . Knee arthroscopy Right 01/22/2013    Procedure: RIGHT ARTHROSCOPY KNEE WITH DEBRIDEMENT, abrasion chondroplasty of lateral tibial plateau, debridement of partial  tear of ACL, menisectomy;  Surgeon: Jacki Cones, MD;  Location: [facility];  Service: Orthopedics;  Laterality: Right;  . Coronary angioplasty with stent placement  01/13/2010    PCI & stent to CX   Current Outpatient Prescriptions on File Prior to Visit  Medication Sig Dispense Refill  . ACCU-CHEK SOFTCLIX LANCETS lancets USE AS INSTRUCTED 100 each 1  . ADVAIR HFA 115-21 MCG/ACT inhaler Inhale 2 puffs into the lungs 2 (two) times daily.    Marland Kitchen albuterol (PROVENTIL HFA;VENTOLIN HFA) 108 (90 BASE) MCG/ACT inhaler Inhale 2 puffs into the lungs every 6 (six) hours as needed for wheezing. 1 Inhaler 0  . ALPRAZolam (XANAX) 0.5 MG tablet Take 1 tablet (0.5 mg total) by mouth 3 (three) times daily as needed for anxiety. 30 tablet 0  . aspirin 81 MG tablet Take 81 mg by mouth daily.    Marland Kitchen atorvastatin (LIPITOR) 40 MG tablet Take 1 tablet by mouth  every night at bedtime 90 tablet 3  . B-D ULTRAFINE III SHORT PEN 31G X 8 MM MISC Use as directed 240 each 3  . clopidogrel (PLAVIX) 75 MG tablet Take 1 tablet by mouth  every day 90 tablet 3  . diltiazem (CARDIZEM) 30 MG tablet Take 1 tablet (30 mg total) by mouth every 6 (six) hours. 60 tablet 0  . glucose blood (ACCU-CHEK AVIVA PLUS) test strip Check bs 4-5 times per day DX-250.00 100 each 11  . insulin lispro (HUMALOG) 100 UNIT/ML KiwkPen Inject 10units into the skin with breakfast and lunch, and inject 20units into the skin with dinner. 30 mL 11  . isosorbide mononitrate (IMDUR) 60 MG 24 hr tablet Take 1 and 1/2 tablets by  mouth every day 135 tablet 1  . LANTUS SOLOSTAR 100 UNIT/ML Solostar Pen Inject subcutaneously 65  units every night at  bedtime (Patient taking differently: Inject subcutaneously 60  units every night at  bedtime) 60 mL 3  . levothyroxine (SYNTHROID, LEVOTHROID) 125 MCG tablet  Take 1 tablet (125 mcg total) by mouth daily. 90 tablet 3  . linagliptin (TRADJENTA) 5 MG TABS tablet Take 1 tablet (5 mg total) by mouth daily. 90 tablet 3  .  metFORMIN (GLUCOPHAGE) 850 MG tablet Take 1 tablet by mouth  twice a day 180 tablet 3  . metoprolol succinate (TOPROL-XL) 200 MG 24 hr tablet Take 1 tablet (200 mg total) by mouth daily. Take with or immediately following a meal. 30 tablet 0  . niacin (NIASPAN) 1000 MG CR tablet Take 1 tablet by mouth  every day 90 tablet 3  . nitroGLYCERIN (NITROSTAT) 0.4 MG SL tablet Place 0.4 mg under the tongue every 5 (five) minutes as needed for chest pain.     Marland Kitchen SYRINGE/NEEDLE, DISP, 1 ML (B-D SYRINGE/NEEDLE 1CC/25GX5/8) 25G X 5/8" 1 ML MISC As directed 100 each 5  . tiotropium (SPIRIVA) 18 MCG inhalation capsule Place 1 capsule into inhaler and inhale daily.     Marland Kitchen zolpidem (AMBIEN) 10 MG tablet Take 1 tablet (10 mg total) by mouth at bedtime as needed. 30 tablet 2   No current facility-administered medications on file prior to visit.   Allergies  Allergen Reactions  . Altace [Ramipril]     Cough    History   Social History  . Marital Status: Divorced    Spouse Name: N/A  . Number of Children: 1  . Years of Education: N/A   Occupational History  .  [employer]   Social History Main Topics  . Smoking status: Former Smoker -- 1.50 packs/day for 35 years    Types: Cigarettes    Quit date: 10/24/2007  . Smokeless tobacco: Never Used  . Alcohol Use: Yes     Comment: social  . Drug Use: No  . Sexual Activity: Not on file   Other Topics Concern  . Not on file   Social History Narrative      Review of Systems  All other systems reviewed and are negative.      Objective:   Physical Exam  Constitutional: He appears well-developed and well-nourished. No distress.  Neck: No JVD present.  Cardiovascular: Normal rate, regular rhythm and normal heart sounds.   Pulmonary/Chest: Effort normal. He has rales.  Abdominal: Soft. Bowel sounds are normal. He exhibits no distension. There is no tenderness. There is no rebound and no guarding.  Musculoskeletal: He exhibits no edema.   Skin: He is not diaphoretic.  Vitals reviewed.         Assessment & Plan:  Hospital discharge follow-up - Plan: COMPLETE METABOLIC PANEL WITH GFR, CBC with Differential/Platelet, Magnesium  I explained to this patient that I felt his hypoxia at his last office visit was multifactorial. I believe all of his issues still related to his pulmonary fibrosis. I believe this caused such a strain on his heart that his heart went into atrial tachycardia versus A. fib with RVR. This ultimately led to heart failure and pulmonary edema causing his worsening hypoxia. To prevent this in the future, I want the patient to weigh himself every day. If he sees his weight go up more than 2 pounds I want him to take 20 mg of Lasix per day and call my office. We will try to maintain a weight within a 2 pound variance of 255 pounds. The patient is also ordered an EKG home monitor to monitor his heart rhythm. I asked the patient to notify my office immediately if his heart rate is consistently greater than  100 bpm. Currently is in normal sinus rhythm with heart rate controlled with metoprolol and Cardizem. We may need to administer Cardizem on a when necessary basis however if he develops future episodes of atrial tachycardia. I also want him to follow up as soon as possible with cardiology as an outpatient. I will try to facilitate a referral as well to the pulmonologist to discuss possible clinical trials to treat his pulmonary fibrosis.  I will also check a CMP as well as a magnesium level to monitor his electrolytes as his hypokalemia and hypomagnesemia may have also contributed to his atrial tachycardia.

## 2015-02-12 ENCOUNTER — Encounter: Payer: Self-pay | Admitting: Family Medicine

## 2015-02-12 LAB — COMPLETE METABOLIC PANEL WITH GFR
ALT: 25 U/L (ref 0–53)
AST: 19 U/L (ref 0–37)
Albumin: 4.1 g/dL (ref 3.5–5.2)
Alkaline Phosphatase: 65 U/L (ref 39–117)
BUN: 32 mg/dL — AB (ref 6–23)
CALCIUM: 9.6 mg/dL (ref 8.4–10.5)
CHLORIDE: 103 meq/L (ref 96–112)
CO2: 27 mEq/L (ref 19–32)
CREATININE: 1.57 mg/dL — AB (ref 0.50–1.35)
GFR, Est African American: 54 mL/min — ABNORMAL LOW
GFR, Est Non African American: 47 mL/min — ABNORMAL LOW
Glucose, Bld: 258 mg/dL — ABNORMAL HIGH (ref 70–99)
Potassium: 4.7 mEq/L (ref 3.5–5.3)
Sodium: 141 mEq/L (ref 135–145)
Total Bilirubin: 0.7 mg/dL (ref 0.2–1.2)
Total Protein: 6.9 g/dL (ref 6.0–8.3)

## 2015-02-12 LAB — CBC WITH DIFFERENTIAL/PLATELET
Basophils Absolute: 0 10*3/uL (ref 0.0–0.1)
Basophils Relative: 0 % (ref 0–1)
EOS ABS: 0.1 10*3/uL (ref 0.0–0.7)
EOS PCT: 2 % (ref 0–5)
HCT: 42.9 % (ref 39.0–52.0)
Hemoglobin: 14.3 g/dL (ref 13.0–17.0)
LYMPHS PCT: 19 % (ref 12–46)
Lymphs Abs: 1.3 10*3/uL (ref 0.7–4.0)
MCH: 29.5 pg (ref 26.0–34.0)
MCHC: 33.3 g/dL (ref 30.0–36.0)
MCV: 88.6 fL (ref 78.0–100.0)
MPV: 9.5 fL (ref 8.6–12.4)
Monocytes Absolute: 0.6 10*3/uL (ref 0.1–1.0)
Monocytes Relative: 8 % (ref 3–12)
Neutro Abs: 4.9 10*3/uL (ref 1.7–7.7)
Neutrophils Relative %: 71 % (ref 43–77)
PLATELETS: 220 10*3/uL (ref 150–400)
RBC: 4.84 MIL/uL (ref 4.22–5.81)
RDW: 14.6 % (ref 11.5–15.5)
WBC: 6.9 10*3/uL (ref 4.0–10.5)

## 2015-02-12 LAB — MAGNESIUM: MAGNESIUM: 1.5 mg/dL (ref 1.5–2.5)

## 2015-02-13 ENCOUNTER — Other Ambulatory Visit: Payer: Self-pay | Admitting: Family Medicine

## 2015-02-15 NOTE — Telephone Encounter (Signed)
Refills given on 01/12/2015 #2 refills.   Refill denied.   Pharmacy made aware.

## 2015-03-09 ENCOUNTER — Ambulatory Visit (INDEPENDENT_AMBULATORY_CARE_PROVIDER_SITE_OTHER): Payer: Medicare Other | Admitting: Internal Medicine

## 2015-03-09 ENCOUNTER — Other Ambulatory Visit (INDEPENDENT_AMBULATORY_CARE_PROVIDER_SITE_OTHER): Payer: Medicare Other

## 2015-03-09 ENCOUNTER — Ambulatory Visit (INDEPENDENT_AMBULATORY_CARE_PROVIDER_SITE_OTHER)
Admission: RE | Admit: 2015-03-09 | Discharge: 2015-03-09 | Disposition: A | Discharge status: Home / Self Care | Payer: Medicare Other | Source: Ambulatory Visit | Attending: Internal Medicine | Admitting: Internal Medicine

## 2015-03-09 ENCOUNTER — Encounter: Payer: Self-pay | Admitting: Internal Medicine

## 2015-03-09 VITALS — BP 116/64 | HR 59 | Ht 73.0 in | Wt 259.0 lb

## 2015-03-09 DIAGNOSIS — J9611 Chronic respiratory failure with hypoxia: Secondary | ICD-10-CM | POA: Insufficient documentation

## 2015-03-09 DIAGNOSIS — J849 Interstitial pulmonary disease, unspecified: Secondary | ICD-10-CM | POA: Diagnosis not present

## 2015-03-09 DIAGNOSIS — N189 Chronic kidney disease, unspecified: Secondary | ICD-10-CM | POA: Diagnosis not present

## 2015-03-09 DIAGNOSIS — N183 Chronic kidney disease, stage 3 unspecified: Secondary | ICD-10-CM | POA: Insufficient documentation

## 2015-03-09 DIAGNOSIS — J438 Other emphysema: Secondary | ICD-10-CM | POA: Diagnosis not present

## 2015-03-09 LAB — BASIC METABOLIC PANEL
BUN: 30 mg/dL — ABNORMAL HIGH (ref 6–23)
CHLORIDE: 102 meq/L (ref 96–112)
CO2: 29 meq/L (ref 19–32)
Calcium: 9.6 mg/dL (ref 8.4–10.5)
Creatinine, Ser: 1.36 mg/dL (ref 0.40–1.50)
GFR: 56.57 mL/min — AB (ref >60.00)
Glucose, Bld: 194 mg/dL — ABNORMAL HIGH (ref 70–99)
Potassium: 4.4 mEq/L (ref 3.5–5.1)
SODIUM: 137 meq/L (ref 135–145)

## 2015-03-09 LAB — RHEUMATOID FACTOR

## 2015-03-09 LAB — SEDIMENTATION RATE: Sed Rate: 16 mm/hr (ref 0–22)

## 2015-03-09 NOTE — Progress Notes (Signed)
Subjective:    Patient ID: Jerry Montgomery, male    DOB: [birth year], 59 y.o.   MRN: [id number]  HPI  OV 03/09/2015  Chief Complaint  Patient presents with  . Follow-up    Pt here for HFU. Pt stated after seeing MW he has been seeing a pulmonologist at [facility].      59 year old male former smoker established patient of the practice with recent admission now referred to see me for interstitial lung disease. History is gathered from him and review of the old chart and outside record from [facility] [ Access to [facility] chart was limited]   quit smoking 2009   he was last seen in 2014 by Dr. Legrand Como wert my colleague. Sometime in  Late 2013 early 2014 he had suffered pneumothorax unspecified site hent status post VATS pleurodesis .  Then in August 2014it appeared CT of the chest showed  Interstitial lung disease ofspecific pattern and associated emphysema. He subsequently followed up at [facility]. I could not for some reason gain access to the chart but he did have a CT scan of the chest that in December 2014 that showed similar findings as here according to the report. ANA autoimmune panel was negative. It appears no other autoimmune panel was checked. Unclear pulmonary function test. Unclear physician opinion. But according to the patient in early 2015 was given a three-month trial of Esbriet. 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 Does not recollect a surgical lung biopsy. He took this for 3 months but due to side effects of fatigue stopped it. He says since then he's been on 3 L oxygen and is felt baseline.  Other issues: he was recommended pulmonary rehabilitation but never did it because of bad knee. Pulmonary transplantation was not considered an option because of his obesity  Most recently admitted a month ago between 02/02/2015 and  02/05/2015 her discharge summary shows a diagnosis of acute respiratory failure  his pulmonary infiltrates. He was never intubated but required high levels of oxygen. Suspicion was pulmonary edema associated with decompensated interstitial lung disease as reason for acute on chronic hypoxic respiratory failure. He was on amio gtt per chart on 02/02/15 but looks like this was stopped under advice of EP. He ws diuresed. Does not look like was given prednisone and discharged on baseline 3L o2  Of note, cxr from April 2016 compared to 2014 is markedly wose (personarlly reviewed film) but is unclear how much of the April 2016 is acute worsening v chronic proigression since 2014.  He tells me that he is feeling baseline and is currently at 3 L.    other pertinent lab review from the hospital  -  02/11/2015: Creatinine is 1.5 mg percent and might be his baseline the hemoglobin was 14.2 g percent.    has a past medical history of Diabetes mellitus; Hypertension; Hyperlipidemia; Postinflammatory pulmonary fibrosis; CAD (coronary artery disease); Pneumothorax on right (4/11); Sleep apnea; COPD (chronic obstructive pulmonary disease); Atrial tachycardia; and Acute diastolic heart failure.   reports that he quit smoking about 7 years ago. His smoking use included Cigarettes. He has a 52.5 pack-year smoking history. He has never used smokeless tobacco.  Past Surgical History  Procedure Laterality Date  . Coronary stent placement  03/05/2003    PCI & Stent  LAD & mid CX  . Elbow surgery    . Rotator cuff repair    . Bilateral vats ablation  rt  . Cardiac catheterization  05/03/2007    patent stents  . Right knee surgery       2012   . Knee arthroscopy Right 01/22/2013    Procedure: RIGHT ARTHROSCOPY KNEE WITH DEBRIDEMENT, abrasion chondroplasty of lateral tibial plateau, debridement of partial tear of ACL, menisectomy;  Surgeon: Tobi Bastos, MD;  Location: [facility];  Service: Orthopedics;  Laterality: Right;  . Coronary angioplasty with stent placement  01/13/2010    PCI & stent  to CX    Allergies  Allergen Reactions  . Altace [Ramipril]     Cough     Immunization History  Administered Date(s) Administered  . Influenza Whole 07/23/2012  . Influenza-Unspecified 07/16/2014  . Pneumococcal Conjugate-13 11/10/2013    Family History  Problem Relation Age of Onset  . Breast cancer Mother   . Lung cancer Father   . Diabetes type II Mother      Current outpatient prescriptions:  .  ACCU-CHEK SOFTCLIX LANCETS lancets, USE AS INSTRUCTED, Disp: 100 each, Rfl: 1 .  ADVAIR HFA 115-21 MCG/ACT inhaler, Inhale 2 puffs into the lungs 2 (two) times daily., Disp: , Rfl:  .  albuterol (PROVENTIL HFA;VENTOLIN HFA) 108 (90 BASE) MCG/ACT inhaler, Inhale 2 puffs into the lungs every 6 (six) hours as needed for wheezing., Disp: 1 Inhaler, Rfl: 0 .  ALPRAZolam (XANAX) 0.5 MG tablet, Take 1 tablet (0.5 mg total) by mouth 3 (three) times daily as needed for anxiety., Disp: 30 tablet, Rfl: 0 .  aspirin 81 MG tablet, Take 81 mg by mouth daily., Disp: , Rfl:  .  atorvastatin (LIPITOR) 40 MG tablet, Take 1 tablet by mouth  every night at bedtime, Disp: 90 tablet, Rfl: 3 .  B-D ULTRAFINE III SHORT PEN 31G X 8 MM MISC, Use as directed, Disp: 240 each, Rfl: 3 .  clopidogrel (PLAVIX) 75 MG tablet, Take 1 tablet by mouth  every day, Disp: 90 tablet, Rfl: 3 .  diltiazem (CARDIZEM) 30 MG tablet, Take 1 tablet (30 mg total) by mouth every 6 (six) hours., Disp: 60 tablet, Rfl: 0 .  furosemide (LASIX) 20 MG tablet, Take 1 tablet (20 mg total) by mouth daily as needed for fluid (fluid)., Disp: 30 tablet, Rfl: 2 .  glucose blood (ACCU-CHEK AVIVA PLUS) test strip, Check bs 4-5 times per day DX-250.00, Disp: 100 each, Rfl: 11 .  insulin lispro (HUMALOG) 100 UNIT/ML KiwkPen, Inject 10units into the skin with breakfast and lunch, and inject 20units into the skin with dinner., Disp: 30 mL, Rfl: 11 .  isosorbide mononitrate (IMDUR) 60 MG 24 hr tablet, Take 1 and 1/2 tablets by  mouth every day, Disp:  135 tablet, Rfl: 1 .  LANTUS SOLOSTAR 100 UNIT/ML Solostar Pen, Inject subcutaneously 65  units every night at  bedtime (Patient taking differently: Inject subcutaneously 60  units every night at  bedtime), Disp: 60 mL, Rfl: 3 .  levothyroxine (SYNTHROID, LEVOTHROID) 125 MCG tablet, Take 1 tablet (125 mcg total) by mouth daily., Disp: 90 tablet, Rfl: 3 .  linagliptin (TRADJENTA) 5 MG TABS tablet, Take 1 tablet (5 mg total) by mouth daily., Disp: 90 tablet, Rfl: 3 .  metFORMIN (GLUCOPHAGE) 850 MG tablet, Take 1 tablet by mouth  twice a day, Disp: 180 tablet, Rfl: 3 .  niacin (NIASPAN) 1000 MG CR tablet, Take 1 tablet by mouth  every day, Disp: 90 tablet, Rfl: 3 .  nitroGLYCERIN (NITROSTAT) 0.4 MG SL tablet, Place 0.4 mg under the tongue every  5 (five) minutes as needed for chest pain. , Disp: , Rfl:  .  SYRINGE/NEEDLE, DISP, 1 ML (B-D SYRINGE/NEEDLE 1CC/25GX5/8) 25G X 5/8" 1 ML MISC, As directed, Disp: 100 each, Rfl: 5 .  tiotropium (SPIRIVA) 18 MCG inhalation capsule, Place 1 capsule into inhaler and inhale daily. , Disp: , Rfl:  .  zolpidem (AMBIEN) 10 MG tablet, Take 1 tablet (10 mg total) by mouth at bedtime as needed., Disp: 30 tablet, Rfl: 2 .  metoprolol succinate (TOPROL-XL) 200 MG 24 hr tablet, Take 1 tablet (200 mg total) by mouth daily. Take with or immediately following a meal. (Patient not taking: Reported on 03/09/2015), Disp: 30 tablet, Rfl: 0    Review of Systems  Constitutional: Negative for fever and unexpected weight change.  HENT: Negative for congestion, dental problem, ear pain, nosebleeds, postnasal drip, rhinorrhea, sinus pressure, sneezing, sore throat and trouble swallowing.   Eyes: Negative for redness and itching.  Respiratory: Positive for cough and shortness of breath. Negative for chest tightness and wheezing.   Cardiovascular: Negative for palpitations and leg swelling.  Gastrointestinal: Negative for nausea and vomiting.  Genitourinary: Negative for dysuria.    Musculoskeletal: Negative for joint swelling.  Skin: Negative for rash.  Neurological: Negative for headaches.  Hematological: Does not bruise/bleed easily.  Psychiatric/Behavioral: Negative for dysphoric mood. The patient is not nervous/anxious.        Objective:   Physical Exam  Constitutional: He is oriented to person, place, and time. He appears well-developed and well-nourished. No distress.  Body mass index is 34.18 kg/(m^2).   HENT:  Head: Normocephalic and atraumatic.  Right Ear: External ear normal.  Left Ear: External ear normal.  Mouth/Throat: Oropharynx is clear and moist. No oropharyngeal exudate.  Eyes: Conjunctivae and EOM are normal. Pupils are equal, round, and reactive to light. Right eye exhibits no discharge. Left eye exhibits no discharge. No scleral icterus.  Neck: Normal range of motion. Neck supple. No JVD present. No tracheal deviation present. No thyromegaly present.  Cardiovascular: Normal rate, regular rhythm and intact distal pulses.  Exam reveals no gallop and no friction rub.   No murmur heard. Pulmonary/Chest: Effort normal and breath sounds normal. No respiratory distress. He has no wheezes. He has no rales. He exhibits no tenderness.  ? Crackles - cannot even hear it  Abdominal: Soft. Bowel sounds are normal. He exhibits no distension and no mass. There is no tenderness. There is no rebound and no guarding.  Musculoskeletal: Normal range of motion. He exhibits no edema or tenderness.  Lymphadenopathy:    He has no cervical adenopathy.  Neurological: He is alert and oriented to person, place, and time. He has normal reflexes. No cranial nerve deficit. Coordination normal.  Skin: Skin is warm and dry. No rash noted. He is not diaphoretic. No erythema. No pallor.  Psychiatric: He has a normal mood and affect. His behavior is normal. Judgment and thought content normal.  Nursing note and vitals reviewed.   Filed Vitals:   03/09/15 1454  BP: 116/64   Pulse: 59  Height: _0  (1.854 m)  Weight: 259 lb (117.482 kg)  SpO2: 98%          Assessment & Plan:     ICD-9-CM ICD-10-CM   1. Chronic respiratory failure with hypoxia 518.83 J96.11 Ambulatory Referral for DME   799.02    2. ILD (interstitial lung disease) 515 J84.9 Ambulatory Referral for DME     DG Chest 2 View  Angiotensin converting enzyme     Mpo/pr-3 (anca) antibodies     ANA     Anti-DNA antibody, double-stranded     Sedimentation rate     Rheumatoid factor     Cyclic citrul peptide antibody, IgG     Anti-scleroderma antibody     Sjogrens syndrome-A extractable nuclear antibody     Sjogrens syndrome-B extractable nuclear antibody     Hypersensitivity pnuemonitis profile     CK total and CKMB (cardiac)     Aldolase     ANCA screen with reflex titer     Basic Metabolic Panel (BMET)     Pulmonary Function Test     ANCA screen with reflex titer  3. Chronic renal insufficiency, unspecified stage 585.9 N18.9   4. Other emphysema 492.8 J43.8    #ILD  in 2014 he had emphysema associated with interstitial lung disease of unspecified variety. It was definitely not UIP pattern or even possibly UIP pattern .  Though he got empiric treatment for IPF now his chest x-ray is significantly worse. What is unclear to me is that if it was just acute worsening with a hospitalization or if it is reflective of chronic progressive interstitial lung disease compared to 2014. In the [place] I could not see any reports of interim radiologic films. I also noted that the autoimmune workup was very limited. I could not access to see if he had a detailed interstitial lung disease questionnaire performed in the past at [place] is to reset and to reestablish the nature of his interstitial lung disease.  Off note, he is interested in a portable oxygen system that works better for him  #Emphysema -  Appears stable - continue MDI  #Chronic renal inuff  in  addition reevaluate his renal function  PLAN  Do full PFT Do CXR 2 view and based on this will call about timing of HRCT chest Do Serum: ESR, ACE, ANA, DS-DNA, RF, anti-CCP, ssA, ssB, scl-70, ANCA screen, MPO, PR-3, Total CK,  RNP, Aldolase,  Hypersensitivity Pneumonitis Panel Do Bmet Meet with [facility] to discuss innogen system for o2 Continue mdi  Followup - timing based on results and further advice - till then cntinue your meds and o2 as before - ACCP ILD questions at followp     Dr. Brand Males, M.D., [facility].C.P Pulmonary and Critical Care Medicine Staff Physician [facility] Pager: [phone], If no answer or between  15:00h - 7:00h: call [phone]  03/09/2015 5:22 PM

## 2015-03-09 NOTE — Patient Instructions (Addendum)
ICD-9-CM ICD-10-CM   1. Chronic respiratory failure with hypoxia 518.83 J96.11    799.02    2. ILD (interstitial lung disease) 515 J84.9   3. Chronic renal insufficiency, unspecified stage 585.9 N18.9   4. Other emphysema 492.8 J43.8    Do full PFT Do CXR 2 view and based on this will call about timing of HRCT chest Do Serum: ESR, ACE, ANA, DS-DNA, RF, anti-CCP, ssA, ssB, scl-70, ANCA screen, MPO, PR-3, Total CK,  RNP, Aldolase,  Hypersensitivity Pneumonitis Panel Do Bmet Meet with [facility] to discuss innogen system for o2  Followup - timing based on results and further advice - till then cntinue your meds and o2 as before - ACCP ILD questions at followp

## 2015-03-10 ENCOUNTER — Other Ambulatory Visit: Payer: Self-pay | Admitting: Internal Medicine

## 2015-03-10 DIAGNOSIS — J849 Interstitial pulmonary disease, unspecified: Secondary | ICD-10-CM

## 2015-03-10 LAB — CK TOTAL AND CKMB (NOT AT ARMC)
CK, MB: 4.4 ng/mL (ref 0.0–5.0)
RELATIVE INDEX: 3.2 (ref 0.0–4.0)
Total CK: 138 U/L (ref 7–232)

## 2015-03-10 LAB — ANCA SCREEN W REFLEX TITER
Atypical p-ANCA Screen: NEGATIVE
c-ANCA Screen: POSITIVE — AB
p-ANCA Screen: NEGATIVE

## 2015-03-10 LAB — SJOGRENS SYNDROME-B EXTRACTABLE NUCLEAR ANTIBODY: SSB (La) (ENA) Antibody, IgG: <1

## 2015-03-10 LAB — ANTI-SCLERODERMA ANTIBODY: Scleroderma (Scl-70) (ENA) Antibody, IgG: <1

## 2015-03-10 LAB — ANCA TITERS

## 2015-03-10 LAB — MPO/PR-3 (ANCA) ANTIBODIES: Myeloperoxidase Abs: <1

## 2015-03-10 LAB — ANA: Anti Nuclear Antibody(ANA): NEGATIVE

## 2015-03-10 LAB — ANGIOTENSIN CONVERTING ENZYME: Angiotensin-Converting Enzyme: 41 U/L (ref 8–52)

## 2015-03-10 LAB — CYCLIC CITRUL PEPTIDE ANTIBODY, IGG

## 2015-03-10 LAB — SJOGRENS SYNDROME-A EXTRACTABLE NUCLEAR ANTIBODY: SSA (RO) (ENA) ANTIBODY, IGG: NEGATIVE

## 2015-03-10 LAB — ANTI-DNA ANTIBODY, DOUBLE-STRANDED: DS DNA AB: 1 [IU]/mL

## 2015-03-11 ENCOUNTER — Encounter: Payer: Self-pay | Admitting: *Deleted

## 2015-03-11 ENCOUNTER — Encounter: Payer: Self-pay | Admitting: Internal Medicine

## 2015-03-11 ENCOUNTER — Ambulatory Visit (INDEPENDENT_AMBULATORY_CARE_PROVIDER_SITE_OTHER): Payer: Medicare Other | Admitting: Internal Medicine

## 2015-03-11 VITALS — BP 146/82 | HR 86 | Ht 73.0 in | Wt 263.0 lb

## 2015-03-11 DIAGNOSIS — J8410 Pulmonary fibrosis, unspecified: Secondary | ICD-10-CM

## 2015-03-11 DIAGNOSIS — I456 Pre-excitation syndrome: Secondary | ICD-10-CM

## 2015-03-11 DIAGNOSIS — I471 Supraventricular tachycardia: Secondary | ICD-10-CM

## 2015-03-11 MED ORDER — NITROGLYCERIN 0.4 MG SL SUBL: 0.4000 mg | SUBLINGUAL_TABLET | SUBLINGUAL | Status: DC | PRN

## 2015-03-11 MED ORDER — METOPROLOL SUCCINATE ER 200 MG PO TB24: 200.0000 mg | ORAL_TABLET | Freq: Every day | ORAL | Status: DC

## 2015-03-11 MED ORDER — DILTIAZEM HCL 30 MG PO TABS: 30.0000 mg | ORAL_TABLET | Freq: Four times a day (QID) | ORAL | Status: DC

## 2015-03-11 NOTE — Assessment & Plan Note (Signed)
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 He has ventricular pre-excitation on his ECG although the pattern is a bit atypical. With the development of LBBB in tachycardia, his SVT slows. I have discussed the treatment options with the patient and his daughter and the risks/benefits/goals/expectations of catheter ablation has been discussed and he wishes to proceed.

## 2015-03-11 NOTE — Assessment & Plan Note (Signed)
Note results of his CT scan. He will continue oxygen and bronchodilators.

## 2015-03-11 NOTE — Progress Notes (Signed)
HPI Jerry Montgomery is referred today by Dr. Tanya NonesPickard for evaluation of SVT. He is a pleasant 59 yo man with multiple medical problems including COPD, pulmonary fibrosis, HTN, obesity and SVT. He has very little in the way of palpitations. The patient has had documented SVT at rates of up to 200/min. He gets sob and feels weak with his episodes. He has received IV adenosine on multiple occaisions. The patient has an atypical delta wave on his baseline ECG which was not picked up by the computer. In tachycardia, his SVT rate goes down when he has LBBB tachycardia, consistent with AVRT using a left lateral AP. His 12 lead ECG actually shows a RBBB pattern with the delta wave masquerading as an R wave.  Allergies  Allergen Reactions  . Altace [Ramipril]     Cough      Current Outpatient Prescriptions  Medication Sig Dispense Refill  . ACCU-CHEK SOFTCLIX LANCETS lancets USE AS INSTRUCTED 100 each 1  . ADVAIR HFA 115-21 MCG/ACT inhaler Inhale 2 puffs into the lungs 2 (two) times daily.    Marland Kitchen. albuterol (PROVENTIL HFA;VENTOLIN HFA) 108 (90 BASE) MCG/ACT inhaler Inhale 2 puffs into the lungs every 6 (six) hours as needed for wheezing. 1 Inhaler 0  . ALPRAZolam (XANAX) 0.5 MG tablet Take 1 tablet (0.5 mg total) by mouth 3 (three) times daily as needed for anxiety. 30 tablet 0  . aspirin 81 MG tablet Take 81 mg by mouth daily.    Marland Kitchen. atorvastatin (LIPITOR) 40 MG tablet Take 1 tablet by mouth  every night at bedtime 90 tablet 3  . B-D ULTRAFINE III SHORT PEN 31G X 8 MM MISC Use as directed 240 each 3  . clopidogrel (PLAVIX) 75 MG tablet Take 1 tablet by mouth  every day 90 tablet 3  . diltiazem (CARDIZEM) 30 MG tablet Take 1 tablet (30 mg total) by mouth every 6 (six) hours. 360 tablet 3  . furosemide (LASIX) 20 MG tablet Take 1 tablet (20 mg total) by mouth daily as needed for fluid (fluid). 30 tablet 2  . glucose blood (ACCU-CHEK AVIVA PLUS) test strip Check bs 4-5 times per day DX-250.00 100 each 11    . insulin lispro (HUMALOG) 100 UNIT/ML KiwkPen Inject 10units into the skin with breakfast and lunch, and inject 20units into the skin with dinner. 30 mL 11  . isosorbide mononitrate (IMDUR) 60 MG 24 hr tablet Take 1 and 1/2 tablets by  mouth every day 135 tablet 1  . LANTUS SOLOSTAR 100 UNIT/ML Solostar Pen Inject subcutaneously 65  units every night at  bedtime (Patient taking differently: Inject subcutaneously 60  units every night at  bedtime) 60 mL 3  . levothyroxine (SYNTHROID, LEVOTHROID) 125 MCG tablet Take 1 tablet (125 mcg total) by mouth daily. 90 tablet 3  . linagliptin (TRADJENTA) 5 MG TABS tablet Take 1 tablet (5 mg total) by mouth daily. 90 tablet 3  . metFORMIN (GLUCOPHAGE) 850 MG tablet Take 1 tablet by mouth  twice a day 180 tablet 3  . metoprolol (TOPROL-XL) 200 MG 24 hr tablet Take 1 tablet (200 mg total) by mouth daily. Take with or immediately following a meal. 90 tablet 3  . niacin (NIASPAN) 1000 MG CR tablet Take 1 tablet by mouth  every day 90 tablet 3  . nitroGLYCERIN (NITROSTAT) 0.4 MG SL tablet Place 1 tablet (0.4 mg total) under the tongue every 5 (five) minutes as needed for chest pain. 25 tablet 1  .  SYRINGE/NEEDLE, DISP, 1 ML (B-D SYRINGE/NEEDLE 1CC/25GX5/8) 25G X 5/8" 1 ML MISC As directed 100 each 5  . tiotropium (SPIRIVA) 18 MCG inhalation capsule Place 1 capsule into inhaler and inhale daily.     Marland Kitchen. zolpidem (AMBIEN) 10 MG tablet Take 10 mg by mouth at bedtime as needed for sleep.     No current facility-administered medications for this visit.     Past Medical History  Diagnosis Date  . Diabetes mellitus   . Hypertension   . Hyperlipidemia   . Postinflammatory pulmonary fibrosis   . CAD (coronary artery disease)   . Pneumothorax on right 4/11  . Sleep apnea     does not use CPAP   . COPD (chronic obstructive pulmonary disease)   . Atrial tachycardia   . Acute diastolic heart failure     ROS:   All systems reviewed and negative except as noted in  the HPI.   Past Surgical History  Procedure Laterality Date  . Coronary stent placement  03/05/2003    PCI & Stent  LAD & mid CX  . Elbow surgery    . Rotator cuff repair    . Bilateral vats ablation      rt  . Cardiac catheterization  05/03/2007    patent stents  . Right knee surgery       2012   . Knee arthroscopy Right 01/22/2013    Procedure: RIGHT ARTHROSCOPY KNEE WITH DEBRIDEMENT, abrasion chondroplasty of lateral tibial plateau, debridement of partial tear of ACL, menisectomy;  Surgeon: Jacki Conesonald A Gioffre, MD;  Location: [facility];  Service: Orthopedics;  Laterality: Right;  . Coronary angioplasty with stent placement  01/13/2010    PCI & stent to CX     Family History  Problem Relation Age of Onset  . Breast cancer Mother   . Lung cancer Father   . Diabetes type II Mother      History   Social History  . Marital Status: Divorced    Spouse Name: N/A  . Number of Children: 1  . Years of Education: N/A   Occupational History  .  [employer]   Social History Main Topics  . Smoking status: Former Smoker -- 1.50 packs/day for 35 years    Types: Cigarettes    Quit date: 10/24/2007  . Smokeless tobacco: Never Used  . Alcohol Use: 0.0 oz/week    0 Standard drinks or equivalent per week     Comment: social  . Drug Use: No  . Sexual Activity: Not on file   Other Topics Concern  . Not on file   Social History Narrative     BP 146/82 mmHg  Pulse 86  Ht 6\' 1"  (1.854 m)  Wt 263 lb (119.296 kg)  BMI 34.71 kg/m2  Physical Exam:  stable appearing middle aged man, wearing oxygen, NAD HEENT: Unremarkable Neck:  7 cm JVD, no thyromegally Lymphatics:  No adenopathy Back:  No CVA tenderness Lungs:  Clear with no wheezes or rhonchi. He has scattered rales. HEART:  Regular rate rhythm, no murmurs, no rubs, no clicks Abd:  soft, obese, positive bowel sounds, no organomegally, no rebound, no guarding Ext:  2 plus pulses, no edema, no cyanosis, no clubbing Skin:  No  rashes no nodules Neuro:  CN II through XII intact, motor grossly intact  EKG - nsr with WPW  Assess/Plan:

## 2015-03-11 NOTE — Assessment & Plan Note (Signed)
He has WPW syndrome. Will schedule cathter ablation.

## 2015-03-11 NOTE — Patient Instructions (Addendum)
Medication Instructions:  Your physician recommends that you continue on your current medications as directed. Please refer to the Current Medication list given to you today. Please take 1/2 of your Metoprolol for 3 days prior to procedure.  Starting on 6/19, take only 100 mg of Metoprolol.  Labwork: None ordered  Testing/Procedures: Your physician has recommended that you have an ablation. Catheter ablation is a medical procedure used to treat some cardiac arrhythmias (irregular heartbeats). During catheter ablation, a long, thin, flexible tube is put into a blood vessel in your groin (upper thigh), or neck. This tube is called an ablation catheter. It is then guided to your heart through the blood vessel. Radio frequency waves destroy small areas of heart tissue where abnormal heartbeats may cause an arrhythmia to start. Please see the instruction sheet given to you today.  Follow-Up: Follow up will be scheduled at discharge post procedure.  Thank you for choosing [facility]!!       Cardiac Ablation Cardiac ablation is a procedure to disable a small amount of heart tissue in very specific places. The heart has many electrical connections. Sometimes these connections are abnormal and can cause the heart to beat very fast or irregularly. By disabling some of the problem areas, heart rhythm can be improved or made normal. Ablation is done for people who:   Have Wolff-Parkinson-White syndrome.   Have other fast heart rhythms (tachycardia).   Have taken medicines for an abnormal heart rhythm (arrhythmia) that resulted in:   No success.   Side effects.   May have a high-risk heartbeat that could result in death.  LET [facility] Center[facility]OUR HEALTH CARE PROVIDER KNOW ABOUT:   Any allergies you have or any previous reactions you have had to X-ray dye, food (such as seafood), medicine, or tape.   All medicines you are taking, including vitamins, herbs, eye drops, creams, and  over-the-counter medicines.   Previous problems you or members of your family have had with the use of anesthetics.   Any blood disorders you have.   Previous surgeries or procedures (such as a kidney transplant) you have had.   Medical conditions you have (such as kidney failure).  RISKS AND COMPLICATIONS Generally, cardiac ablation is a safe procedure. However, problems can occur and include:   Increased risk of cancer. Depending on how long it takes to do the ablation, the dose of radiation can be high.  Bruising and bleeding where a thin, flexible tube (catheter) was inserted during the procedure.   Bleeding into the chest, especially into the sac that surrounds the heart (serious).  Need for a permanent pacemaker if the normal electrical system is damaged.   The procedure may not be fully effective, and this may not be recognized for months. Repeat ablation procedures are sometimes required. BEFORE THE PROCEDURE   Follow any instructions from your health care provider regarding eating and drinking before the procedure.   Take your medicines as directed at regular times with water, unless instructed otherwise by your health care provider. If you are taking diabetes medicine, including insulin, ask how you are to take it and if there are any special instructions you should follow. It is common to adjust insulin dosing the day of the ablation.  PROCEDURE  An ablation is usually performed in a catheterization laboratory with the guidance of fluoroscopy. Fluoroscopy is a type of X-ray that helps your health care provider see images of your heart during the procedure.   An ablation is a minimally  invasive procedure. This means a small cut (incision) is made in either your neck or groin. Your health care provider will decide where to make the incision based on your medical history and physical exam.  An IV tube will be started before the procedure begins. You will be given an  anesthetic or medicine to help you relax (sedative).  The skin on your neck or groin will be numbed. A needle will be inserted into a large vein in your neck or groin and catheters will be threaded to your heart.  A special dye that shows up on fluoroscopy pictures may be injected through the catheter. The dye helps your health care provider see the area of the heart that needs treatment.  The catheter has electrodes on the tip. When the area of heart tissue that is causing the arrhythmia is found, the catheter tip will send an electrical current to the area and "scar" the tissue. Three types of energy can be used to ablate the heart tissue:   Heat (radiofrequency energy).   Laser energy.   Extreme cold (cryoablation).   When the area of the heart has been ablated, the catheter will be taken out. Pressure will be held on the insertion site. This will help the insertion site clot and keep it from bleeding. A bandage will be placed on the insertion site.  AFTER THE PROCEDURE   After the procedure, you will be taken to a recovery area where your vital signs (blood pressure, heart rate, and breathing) will be monitored. The insertion site will also be monitored for bleeding.   You will need to lie still for 4-6 hours. This is to ensure you do not bleed from the catheter insertion site.  Document Released: 02/25/2009 Document Revised: 02/23/2014 Document Reviewed: 03/03/2013 [facility] Patient Information 2015 [employer] [place]. This information is not intended to replace advice given to you by your health care provider. Make sure you discuss any questions you have with your health care provider.

## 2015-03-13 LAB — ALDOLASE: ALDOLASE: 6.3 U/L (ref <=8.1)

## 2015-03-15 ENCOUNTER — Other Ambulatory Visit: Payer: Self-pay | Admitting: Family Medicine

## 2015-03-15 DIAGNOSIS — F418 Other specified anxiety disorders: Secondary | ICD-10-CM

## 2015-03-15 LAB — HYPERSENSITIVITY PNUEMONITIS PROFILE

## 2015-03-15 MED ORDER — ALPRAZOLAM 0.5 MG PO TABS: 0.5000 mg | ORAL_TABLET | Freq: Three times a day (TID) | ORAL | Status: DC | PRN

## 2015-03-16 ENCOUNTER — Ambulatory Visit (HOSPITAL_COMMUNITY)
Admission: RE | Admit: 2015-03-16 | Discharge: 2015-03-16 | Disposition: A | Discharge status: Home / Self Care | Payer: Medicare Other | Source: Ambulatory Visit | Attending: Internal Medicine | Admitting: Internal Medicine

## 2015-03-16 DIAGNOSIS — J849 Interstitial pulmonary disease, unspecified: Secondary | ICD-10-CM | POA: Diagnosis present

## 2015-03-16 LAB — PULMONARY FUNCTION TEST
DL/VA % pred: 60 %
DL/VA: 2.92 ml/min/mmHg/L
DLCO UNC % PRED: 39 %
DLCO UNC: 15.02 ml/min/mmHg
FEF 25-75 POST: 2.03 L/s
FEF 25-75 Pre: 1.64 L/sec
FEF2575-%Change-Post: 23 %
FEF2575-%PRED-POST: 61 %
FEF2575-%Pred-Pre: 49 %
FEV1-%CHANGE-POST: 4 %
FEV1-%Pred-Post: 64 %
FEV1-%Pred-Pre: 61 %
FEV1-Post: 2.63 L
FEV1-Pre: 2.52 L
FEV1FVC-%Change-Post: 5 %
FEV1FVC-%Pred-Pre: 96 %
FEV6-%CHANGE-POST: 0 %
FEV6-%Pred-Post: 66 %
FEV6-%Pred-Pre: 66 %
FEV6-POST: 3.43 L
FEV6-Pre: 3.42 L
FEV6FVC-%Change-Post: 0 %
FEV6FVC-%Pred-Post: 103 %
FEV6FVC-%Pred-Pre: 102 %
FVC-%Change-Post: 0 %
FVC-%Pred-Post: 64 %
FVC-%Pred-Pre: 64 %
FVC-Post: 3.47 L
FVC-Pre: 3.48 L
POST FEV1/FVC RATIO: 76 %
POST FEV6/FVC RATIO: 99 %
Pre FEV1/FVC ratio: 72 %
Pre FEV6/FVC Ratio: 99 %
RV % pred: 90 %
RV: 2.26 L
TLC % PRED: 76 %
TLC: 5.99 L

## 2015-03-16 MED ORDER — ALBUTEROL SULFATE (2.5 MG/3ML) 0.083% IN NEBU
2.5000 mg | INHALATION_SOLUTION | Freq: Once | RESPIRATORY_TRACT | Status: AC
  Administered 2015-03-16: 2.5 mg via RESPIRATORY_TRACT

## 2015-03-18 ENCOUNTER — Ambulatory Visit (INDEPENDENT_AMBULATORY_CARE_PROVIDER_SITE_OTHER)
Admission: RE | Admit: 2015-03-18 | Discharge: 2015-03-18 | Disposition: A | Discharge status: Home / Self Care | Payer: Medicare Other | Source: Ambulatory Visit | Attending: Internal Medicine | Admitting: Internal Medicine

## 2015-03-18 ENCOUNTER — Telehealth: Payer: Self-pay | Admitting: Internal Medicine

## 2015-03-18 ENCOUNTER — Telehealth: Payer: Self-pay | Admitting: Family Medicine

## 2015-03-18 DIAGNOSIS — J849 Interstitial pulmonary disease, unspecified: Secondary | ICD-10-CM | POA: Diagnosis not present

## 2015-03-18 MED ORDER — ZOLPIDEM TARTRATE 10 MG PO TABS: 10.0000 mg | ORAL_TABLET | Freq: Every evening | ORAL | Status: DC | PRN

## 2015-03-18 MED ORDER — FUROSEMIDE 20 MG PO TABS: 20.0000 mg | ORAL_TABLET | Freq: Every day | ORAL | Status: DC | PRN

## 2015-03-18 NOTE — Telephone Encounter (Addendum)
[employer] ab panel 03/09/15 and HP panel - all negative. C-anca trace positive but mpo/pr3 negative  PFT fvc 3.5L/64%, Ratio 72, TLC 6L/76%, DLCO 15/39% - moderate restriction.  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 He is having some EP procedure 04/14/15 and prefers his right heart cath cmbined with EP procdure. Will send phone note to his cards dco  Dr. Kalman ShanMurali Adama Ivins, M.D., [facility].C.P Pulmonary and Critical Care Medicine Staff Physician [facility] [place] [facility] Pager: [phone], If no answer or between  15:00h - 7:00h: call [phone]  03/18/2015 1:16 PM     .

## 2015-03-18 NOTE — Telephone Encounter (Signed)
Patient requesting speak with you regarding his medication [phone]

## 2015-03-18 NOTE — Telephone Encounter (Signed)
ok 

## 2015-03-18 NOTE — Telephone Encounter (Signed)
Requesting a refill on Ambien and for 90 days - ? OK to Refill   needs furosemide refilled for 90 days.

## 2015-03-18 NOTE — Telephone Encounter (Signed)
Medication called/sent to requested pharmacy  

## 2015-03-24 ENCOUNTER — Telehealth: Payer: Self-pay | Admitting: Internal Medicine

## 2015-03-24 NOTE — Telephone Encounter (Signed)
Hi Tammy SoursGreg and Tom  This patient has idiopathich ILD and is in need of surgicallung bx. However, his o2 needs are out of proportin with his ILD and noted echo in 2014 PASPP 40s and CT may 2016 with enlarged pulmonary arteries. Can you guys please arrange for a right heart cath; ? Can be done with his EP procedure. Will help with dx and also bipsy risk assessment  Thanks  Dr. Kalman ShanMurali Teonna Coonan, M.D., [facility].C.C.P Pulmonary and Critical Care Medicine Staff Physician [facility] [place] [facility] Pager: [phone], If no answer or between  15:00h - 7:00h: call [phone]  03/24/2015 4:06 PM

## 2015-04-01 NOTE — Telephone Encounter (Signed)
Will obtain right heart cath when we do ablation. GT.

## 2015-04-13 MED ORDER — SODIUM CHLORIDE 0.9 % IV SOLN: INTRAVENOUS | Status: DC

## 2015-04-14 ENCOUNTER — Encounter (HOSPITAL_COMMUNITY)
Admission: RE | Disposition: A | Discharge status: Home / Self Care | Payer: Medicare Other | Source: Ambulatory Visit | Attending: Internal Medicine

## 2015-04-14 ENCOUNTER — Encounter (HOSPITAL_COMMUNITY): Payer: Self-pay | Admitting: Internal Medicine

## 2015-04-14 ENCOUNTER — Ambulatory Visit (HOSPITAL_COMMUNITY)
Admission: RE | Admit: 2015-04-14 | Discharge: 2015-04-15 | Disposition: A | Discharge status: Home / Self Care | Payer: Medicare Other | Source: Ambulatory Visit | Attending: Internal Medicine | Admitting: Internal Medicine

## 2015-04-14 DIAGNOSIS — Z955 Presence of coronary angioplasty implant and graft: Secondary | ICD-10-CM | POA: Insufficient documentation

## 2015-04-14 DIAGNOSIS — J8410 Pulmonary fibrosis, unspecified: Secondary | ICD-10-CM | POA: Insufficient documentation

## 2015-04-14 DIAGNOSIS — E119 Type 2 diabetes mellitus without complications: Secondary | ICD-10-CM | POA: Insufficient documentation

## 2015-04-14 DIAGNOSIS — I10 Essential (primary) hypertension: Secondary | ICD-10-CM | POA: Diagnosis not present

## 2015-04-14 DIAGNOSIS — E785 Hyperlipidemia, unspecified: Secondary | ICD-10-CM | POA: Diagnosis not present

## 2015-04-14 DIAGNOSIS — Z7902 Long term (current) use of antithrombotics/antiplatelets: Secondary | ICD-10-CM | POA: Diagnosis not present

## 2015-04-14 DIAGNOSIS — E669 Obesity, unspecified: Secondary | ICD-10-CM | POA: Diagnosis not present

## 2015-04-14 DIAGNOSIS — Z87891 Personal history of nicotine dependence: Secondary | ICD-10-CM | POA: Diagnosis not present

## 2015-04-14 DIAGNOSIS — J449 Chronic obstructive pulmonary disease, unspecified: Secondary | ICD-10-CM | POA: Diagnosis not present

## 2015-04-14 DIAGNOSIS — I456 Pre-excitation syndrome: Secondary | ICD-10-CM | POA: Diagnosis present

## 2015-04-14 DIAGNOSIS — Z7982 Long term (current) use of aspirin: Secondary | ICD-10-CM | POA: Insufficient documentation

## 2015-04-14 DIAGNOSIS — I471 Supraventricular tachycardia: Secondary | ICD-10-CM | POA: Insufficient documentation

## 2015-04-14 HISTORY — DX: Type 2 diabetes mellitus without complications: E11.9

## 2015-04-14 HISTORY — DX: Hypothyroidism, unspecified: E03.9

## 2015-04-14 HISTORY — PX: ELECTROPHYSIOLOGIC STUDY: SHX172A

## 2015-04-14 HISTORY — DX: Dependence on supplemental oxygen: Z99.81

## 2015-04-14 LAB — CBC
HEMATOCRIT: 38.4 % — AB (ref 39.0–52.0)
Hemoglobin: 13.2 g/dL (ref 13.0–17.0)
MCH: 29.3 pg (ref 26.0–34.0)
MCHC: 34.4 g/dL (ref 30.0–36.0)
MCV: 85.3 fL (ref 78.0–100.0)
Platelets: 155 10*3/uL (ref 150–400)
RBC: 4.5 MIL/uL (ref 4.22–5.81)
RDW: 13.4 % (ref 11.5–15.5)
WBC: 5.9 10*3/uL (ref 4.0–10.5)

## 2015-04-14 LAB — BASIC METABOLIC PANEL
Anion gap: 12 (ref 5–15)
BUN: 17 mg/dL (ref 6–20)
CHLORIDE: 104 mmol/L (ref 101–111)
CO2: 23 mmol/L (ref 22–32)
Calcium: 8.8 mg/dL — ABNORMAL LOW (ref 8.9–10.3)
Creatinine, Ser: 1.32 mg/dL — ABNORMAL HIGH (ref 0.61–1.24)
GFR calc Af Amer: >60 mL/min (ref >60)
GFR calc non Af Amer: 57 mL/min — ABNORMAL LOW (ref >60)
Glucose, Bld: 146 mg/dL — ABNORMAL HIGH (ref 65–99)
Potassium: 3.7 mmol/L (ref 3.5–5.1)
Sodium: 139 mmol/L (ref 135–145)

## 2015-04-14 LAB — GLUCOSE, CAPILLARY
GLUCOSE-CAPILLARY: 142 mg/dL — AB (ref 65–99)
GLUCOSE-CAPILLARY: 120 mg/dL — AB (ref 65–99)
Glucose-Capillary: 114 mg/dL — ABNORMAL HIGH (ref 65–99)
Glucose-Capillary: 226 mg/dL — ABNORMAL HIGH (ref 65–99)

## 2015-04-14 LAB — POCT ACTIVATED CLOTTING TIME
Activated Clotting Time: 190 s
Activated Clotting Time: 153 seconds

## 2015-04-14 SURGERY — A-FLUTTER/A-TACH/SVT ABLATION

## 2015-04-14 MED ORDER — LEVOTHYROXINE SODIUM 25 MCG PO TABS
125.0000 ug | ORAL_TABLET | Freq: Every day | ORAL | Status: DC
  Administered 2015-04-15: 125 ug via ORAL
  Filled 2015-04-14 (×2): qty 1

## 2015-04-14 MED ORDER — MIDAZOLAM HCL 5 MG/5ML IJ SOLN
INTRAMUSCULAR | Status: AC
  Filled 2015-04-14: qty 5

## 2015-04-14 MED ORDER — BUPIVACAINE HCL (PF) 0.25 % IJ SOLN
INTRAMUSCULAR | Status: DC | PRN
  Administered 2015-04-14: 60 mL

## 2015-04-14 MED ORDER — SODIUM CHLORIDE 0.9 % IV SOLN: 250.0000 mL | INTRAVENOUS | Status: DC | PRN

## 2015-04-14 MED ORDER — HEPARIN SODIUM (PORCINE) 1000 UNIT/ML IJ SOLN
INTRAMUSCULAR | Status: DC | PRN
  Administered 2015-04-14: 6000 [IU] via INTRAVENOUS

## 2015-04-14 MED ORDER — ALBUTEROL SULFATE (2.5 MG/3ML) 0.083% IN NEBU: 2.5000 mg | INHALATION_SOLUTION | Freq: Four times a day (QID) | RESPIRATORY_TRACT | Status: DC | PRN

## 2015-04-14 MED ORDER — INSULIN LISPRO 100 UNIT/ML (KWIKPEN): 10.0000 [IU] | PEN_INJECTOR | Freq: Three times a day (TID) | SUBCUTANEOUS | Status: DC

## 2015-04-14 MED ORDER — HEPARIN (PORCINE) IN NACL 2-0.9 UNIT/ML-% IJ SOLN
INTRAMUSCULAR | Status: AC
  Filled 2015-04-14: qty 500

## 2015-04-14 MED ORDER — ZOLPIDEM TARTRATE 5 MG PO TABS
10.0000 mg | ORAL_TABLET | Freq: Every evening | ORAL | Status: DC | PRN
Start: 2015-04-14 — End: 2015-04-15
  Administered 2015-04-14: 10 mg via ORAL
  Filled 2015-04-14: qty 2

## 2015-04-14 MED ORDER — INSULIN GLARGINE 100 UNIT/ML SOLOSTAR PEN: 60.0000 [IU] | PEN_INJECTOR | Freq: Every day | SUBCUTANEOUS | Status: DC

## 2015-04-14 MED ORDER — SODIUM CHLORIDE 0.9 % IJ SOLN: 3.0000 mL | INTRAMUSCULAR | Status: DC | PRN

## 2015-04-14 MED ORDER — HEPARIN SODIUM (PORCINE) 1000 UNIT/ML IJ SOLN
INTRAMUSCULAR | Status: AC
  Filled 2015-04-14: qty 1

## 2015-04-14 MED ORDER — FENTANYL CITRATE (PF) 100 MCG/2ML IJ SOLN
INTRAMUSCULAR | Status: DC | PRN
  Administered 2015-04-14 (×6): 12.5 ug via INTRAVENOUS
  Administered 2015-04-14: 25 ug via INTRAVENOUS

## 2015-04-14 MED ORDER — FENTANYL CITRATE (PF) 100 MCG/2ML IJ SOLN
INTRAMUSCULAR | Status: AC
  Filled 2015-04-14: qty 2

## 2015-04-14 MED ORDER — ASPIRIN EC 81 MG PO TBEC
81.0000 mg | DELAYED_RELEASE_TABLET | Freq: Every day | ORAL | Status: DC
  Administered 2015-04-14 – 2015-04-15 (×2): 81 mg via ORAL
  Filled 2015-04-14 (×3): qty 1

## 2015-04-14 MED ORDER — ONDANSETRON HCL 4 MG/2ML IJ SOLN: 4.0000 mg | Freq: Four times a day (QID) | INTRAMUSCULAR | Status: DC | PRN

## 2015-04-14 MED ORDER — BUPIVACAINE HCL (PF) 0.25 % IJ SOLN
INTRAMUSCULAR | Status: AC
  Filled 2015-04-14: qty 60

## 2015-04-14 MED ORDER — LINAGLIPTIN 5 MG PO TABS
5.0000 mg | ORAL_TABLET | Freq: Every day | ORAL | Status: DC
  Administered 2015-04-14 – 2015-04-15 (×2): 5 mg via ORAL
  Filled 2015-04-14 (×2): qty 1

## 2015-04-14 MED ORDER — SODIUM CHLORIDE 0.9 % IJ SOLN
3.0000 mL | Freq: Two times a day (BID) | INTRAMUSCULAR | Status: DC
  Administered 2015-04-14 – 2015-04-15 (×2): 3 mL via INTRAVENOUS

## 2015-04-14 MED ORDER — ACETAMINOPHEN 325 MG PO TABS: 650.0000 mg | ORAL_TABLET | ORAL | Status: DC | PRN

## 2015-04-14 MED ORDER — METFORMIN HCL 850 MG PO TABS
850.0000 mg | ORAL_TABLET | Freq: Every day | ORAL | Status: DC
  Administered 2015-04-15: 850 mg via ORAL
  Filled 2015-04-14 (×2): qty 1

## 2015-04-14 MED ORDER — CLOPIDOGREL BISULFATE 75 MG PO TABS
75.0000 mg | ORAL_TABLET | Freq: Every day | ORAL | Status: DC
  Administered 2015-04-14 – 2015-04-15 (×2): 75 mg via ORAL
  Filled 2015-04-14 (×2): qty 1

## 2015-04-14 MED ORDER — INSULIN ASPART 100 UNIT/ML ~~LOC~~ SOLN
10.0000 [IU] | Freq: Three times a day (TID) | SUBCUTANEOUS | Status: DC
  Administered 2015-04-14 – 2015-04-15 (×2): 10 [IU] via SUBCUTANEOUS

## 2015-04-14 MED ORDER — ALPRAZOLAM 0.5 MG PO TABS: 0.5000 mg | ORAL_TABLET | Freq: Three times a day (TID) | ORAL | Status: DC | PRN

## 2015-04-14 MED ORDER — ALBUTEROL SULFATE HFA 108 (90 BASE) MCG/ACT IN AERS: 2.0000 | INHALATION_SPRAY | Freq: Four times a day (QID) | RESPIRATORY_TRACT | Status: DC | PRN

## 2015-04-14 MED ORDER — ISOSORBIDE MONONITRATE ER 30 MG PO TB24
30.0000 mg | ORAL_TABLET | Freq: Every day | ORAL | Status: DC
  Administered 2015-04-14 – 2015-04-15 (×2): 30 mg via ORAL
  Filled 2015-04-14 (×2): qty 1

## 2015-04-14 MED ORDER — METOPROLOL SUCCINATE ER 50 MG PO TB24
50.0000 mg | ORAL_TABLET | Freq: Every day | ORAL | Status: DC
  Administered 2015-04-14 – 2015-04-15 (×2): 50 mg via ORAL
  Filled 2015-04-14 (×2): qty 1

## 2015-04-14 MED ORDER — FUROSEMIDE 20 MG PO TABS: 20.0000 mg | ORAL_TABLET | Freq: Every day | ORAL | Status: DC | PRN

## 2015-04-14 MED ORDER — MIDAZOLAM HCL 5 MG/5ML IJ SOLN
INTRAMUSCULAR | Status: DC | PRN
  Administered 2015-04-14 (×8): 1 mg via INTRAVENOUS

## 2015-04-14 MED ORDER — INSULIN GLARGINE 100 UNIT/ML ~~LOC~~ SOLN
60.0000 [IU] | Freq: Every day | SUBCUTANEOUS | Status: DC
  Administered 2015-04-14: 60 [IU] via SUBCUTANEOUS
  Filled 2015-04-14 (×2): qty 0.6

## 2015-04-14 SURGICAL SUPPLY — 14 items
BAG SNAP BAND KOVER 36X36 (MISCELLANEOUS) ×3 IMPLANT
CATH CELSIUS THERM D CV 7F (ABLATOR) ×3 IMPLANT
CATH JOSEPHSON QUAD-ALLRED 6FR (CATHETERS) ×6 IMPLANT
CATH POLARIS X 2.5/5/2.5 DECAP (CATHETERS) ×3 IMPLANT
GUIDEWIRE ANGLED .035X150CM (WIRE) ×3 IMPLANT
PACK EP LATEX FREE (CUSTOM PROCEDURE TRAY) ×2
PACK EP LF (CUSTOM PROCEDURE TRAY) ×1 IMPLANT
PAD DEFIB LIFELINK (PAD) ×3 IMPLANT
SHEATH PINNACLE 6F 10CM (SHEATH) ×6 IMPLANT
SHEATH PINNACLE 7F 10CM (SHEATH) ×3 IMPLANT
SHEATH PINNACLE 8F 10CM (SHEATH) ×6 IMPLANT
SHIELD RADPAD SCOOP 12X17 (MISCELLANEOUS) ×3 IMPLANT
TUBING ART PRESS 72  MALE/MALE (TUBING) ×3 IMPLANT
WIRE HI TORQ VERSACORE-J 145CM (WIRE) ×3 IMPLANT

## 2015-04-14 NOTE — Discharge Summary (Signed)
ELECTROPHYSIOLOGY PROCEDURE DISCHARGE SUMMARY    Patient ID: Jerry Montgomery,  MRN: [id number], DOB/AGE: Oct 03, 1956 59 y.o.  Admit date: 04/14/2015 Discharge date: 04/15/2015  Primary Care Physician: [name], MD Electrophysiologist: [name]  Primary Discharge Diagnosis:  WPW syndrom with symptomatic SVT, status post ablation this admission  Secondary Discharge Diagnosis:  1.  Pulmonary fibrosis, not on oxygen 2.  Diabetes 3.  Hypertension 4.  Hyperlipidemia 5.  COPD  Allergies  Allergen Reactions  . Altace [Ramipril]     Cough      Procedures This Admission: 1.  Electrophysiology study and radiofrequency catheter ablation on 04/14/15 by Dr [name].  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  There were no early apparent complications.   Brief HPI: Jerry Montgomery is a 59 y.o. male with a past medical history as outlined above. He has had recurrent SVT requiring adenosine for termination.  He was referred to EP for further evaluation.  Risks, benefits, and alternatives to ablation were discussed with the patient who wished to proceed.  Hospital Course:  The patient was admitted and underwent ablation of a left posterior accessory pathway on 04/14/15 by Dr [name].  He was monitored on telemetry overnight which demonstrated sinus rhythm.  His groin and neck incisions were without complication.  He was evaluated and considered stable for discharge to home.  He will follow up in the office in 4 weeks. Will wean his metoprolol.  Physical Exam: Filed Vitals:   04/14/15 1330 04/14/15 1332 04/14/15 1358 04/14/15 1945  BP: 121/70 121/70 122/75 110/71  Pulse: 60 65 60 72  Temp:   97.7 F (36.5 C) 97.9 F (36.6 C)  TempSrc:   Oral Oral  Resp: 10     Height:      Weight:      SpO2: 98%  95% 93%    GEN- The patient is well appearing, alert and oriented x 3 today.   HEENT:  normocephalic, atraumatic; sclera clear, conjunctiva pink; hearing intact; oropharynx clear; neck supple, no JVP Lymph- no cervical lymphadenopathy Lungs- Clear to ausculation bilaterally, normal work of breathing.  No wheezes, rales, rhonchi Heart- Regular rate and rhythm, no murmurs, rubs or gallops  GI- soft, non-tender, non-distended, bowel sounds present  Extremities- no clubbing, cyanosis, or edema; DP/PT/radial pulses 2+ bilaterally MS- no significant deformity or atrophy Skin- warm and dry, no rash or lesion Psych- euthymic mood, full affect Neuro- strength and sensation are intact    Labs:   Lab Results  Component Value Date   WBC 5.9 04/14/2015   HGB 13.2 04/14/2015   HCT 38.4* 04/14/2015   MCV 85.3 04/14/2015   PLT 155 04/14/2015     Recent Labs Lab 04/14/15 0723  NA 139  K 3.7  CL 104  CO2 23  BUN 17  CREATININE 1.32*  CALCIUM 8.8*  GLUCOSE 146*     Discharge Medications:    Medication List    TAKE these medications        ACCU-CHEK SOFTCLIX LANCETS lancets  USE AS INSTRUCTED     ADVAIR HFA 115-21 MCG/ACT inhaler  Generic drug:  fluticasone-salmeterol  Inhale 2 puffs into the lungs 2 (two) times daily.     albuterol 108 (90 BASE) MCG/ACT inhaler  Commonly known as:  PROVENTIL HFA;VENTOLIN HFA  Inhale 2 puffs into the lungs every 6 (six) hours as needed for wheezing.     ALPRAZolam  0.5 MG tablet  Commonly known as:  XANAX  Take 1 tablet (0.5 mg total) by mouth 3 (three) times daily as needed for anxiety.     aspirin 81 MG tablet  Take 81 mg by mouth daily.     atorvastatin 40 MG tablet  Commonly known as:  LIPITOR  Take 1 tablet by mouth  every night at bedtime     B-D ULTRAFINE III SHORT PEN 31G X 8 MM Misc  Generic drug:  Insulin Pen Needle  Use as directed     clopidogrel 75 MG tablet  Commonly known as:  PLAVIX  Take 1 tablet by mouth  every day     diltiazem 30 MG tablet  Commonly known as:  CARDIZEM  Take 1 tablet (30 mg  total) by mouth every 6 (six) hours.     furosemide 20 MG tablet  Commonly known as:  LASIX  Take 1 tablet (20 mg total) by mouth daily as needed for fluid (fluid).     glucose blood test strip  Commonly known as:  ACCU-CHEK AVIVA PLUS  Check bs 4-5 times per day DX-250.00     insulin lispro 100 UNIT/ML KiwkPen  Commonly known as:  HUMALOG  Inject 10units into the skin with breakfast and lunch, and inject 20units into the skin with dinner.     isosorbide mononitrate 60 MG 24 hr tablet  Commonly known as:  IMDUR  Take 1 and 1/2 tablets by  mouth every day     LANTUS SOLOSTAR 100 UNIT/ML Solostar Pen  Generic drug:  Insulin Glargine  Inject subcutaneously 65  units every night at  bedtime     levothyroxine 125 MCG tablet  Commonly known as:  SYNTHROID, LEVOTHROID  Take 1 tablet (125 mcg total) by mouth daily.     linagliptin 5 MG Tabs tablet  Commonly known as:  TRADJENTA  Take 1 tablet (5 mg total) by mouth daily.     metFORMIN 850 MG tablet  Commonly known as:  GLUCOPHAGE  Take 1 tablet by mouth  twice a day     metoprolol succinate 50 MG 24 hr tablet  Commonly known as:  TOPROL-XL  Take 1 tablet (50 mg total) by mouth daily. Take with or immediately following a meal.     nitroGLYCERIN 0.4 MG SL tablet  Commonly known as:  NITROSTAT  Place 1 tablet (0.4 mg total) under the tongue every 5 (five) minutes as needed for chest pain.     SYRINGE/NEEDLE (DISP) 1 ML 25G X 5/8" 1 ML Misc  Commonly known as:  B-D SYRINGE/NEEDLE 1CC/25GX5/8  As directed     tiotropium 18 MCG inhalation capsule  Commonly known as:  SPIRIVA  Place 1 capsule into inhaler and inhale daily.     zolpidem 10 MG tablet  Commonly known as:  AMBIEN  Take 1 tablet (10 mg total) by mouth at bedtime as needed for sleep.        Disposition:  Discharge Instructions    Diet - low sodium heart healthy    Complete by:  As directed      Increase activity slowly    Complete by:  As directed             Follow-up Information    Follow up with Lewayne Bunting, MD On 05/12/2015.   Specialty:  Cardiology   Why:  at 11AM   Contact information:   [address] [place] [address] [phone]  Duration of Discharge Encounter: Greater than 30 minutes including physician time.  Signed, Gypsy Balsam, NP 04/15/2015 8:07 AM  EP Attending  Patient seen and examined. Agree with above note with minimal modifications. Ok for discharge.  Leonia Reeves.D.

## 2015-04-14 NOTE — H&P (Signed)
HPI Jerry Montgomery is referred today by Dr. Tanya Nones for evaluation of SVT. He is a pleasant 59 yo man with multiple medical problems including COPD, pulmonary fibrosis, HTN, obesity and SVT. He has very little in the way of palpitations. The patient has had documented SVT at rates of up to 200/min. He gets sob and feels weak with his episodes. He has received IV adenosine on multiple occaisions. The patient has an atypical delta wave on his baseline ECG which was not picked up by the computer. In tachycardia, his SVT rate goes down when he has LBBB tachycardia, consistent with AVRT using a left lateral AP. His 12 lead ECG actually shows a RBBB pattern with the delta wave masquerading as an R wave.  Allergies  Allergen Reactions  . Altace [Ramipril]     Cough      Current Outpatient Prescriptions  Medication Sig Dispense Refill  . ACCU-CHEK SOFTCLIX LANCETS lancets USE AS INSTRUCTED 100 each 1  . ADVAIR HFA 115-21 MCG/ACT inhaler Inhale 2 puffs into the lungs 2 (two) times daily.    Marland Kitchen albuterol (PROVENTIL HFA;VENTOLIN HFA) 108 (90 BASE) MCG/ACT inhaler Inhale 2 puffs into the lungs every 6 (six) hours as needed for wheezing. 1 Inhaler 0  . ALPRAZolam (XANAX) 0.5 MG tablet Take 1 tablet (0.5 mg total) by mouth 3 (three) times daily as needed for anxiety. 30 tablet 0  . aspirin 81 MG tablet Take 81 mg by mouth daily.    Marland Kitchen atorvastatin (LIPITOR) 40 MG tablet Take 1 tablet by mouth every night at bedtime 90 tablet 3  . B-D ULTRAFINE III SHORT PEN 31G X 8 MM MISC Use as directed 240 each 3  . clopidogrel (PLAVIX) 75 MG tablet Take 1 tablet by mouth every day 90 tablet 3  . diltiazem (CARDIZEM) 30 MG tablet Take 1 tablet (30 mg total) by mouth every 6 (six) hours. 360 tablet 3  . furosemide (LASIX) 20 MG tablet Take 1 tablet (20 mg total) by mouth daily as needed for fluid (fluid). 30 tablet 2  . glucose blood (ACCU-CHEK AVIVA PLUS)  test strip Check bs 4-5 times per day DX-250.00 100 each 11  . insulin lispro (HUMALOG) 100 UNIT/ML KiwkPen Inject 10units into the skin with breakfast and lunch, and inject 20units into the skin with dinner. 30 mL 11  . isosorbide mononitrate (IMDUR) 60 MG 24 hr tablet Take 1 and 1/2 tablets by mouth every day 135 tablet 1  . LANTUS SOLOSTAR 100 UNIT/ML Solostar Pen Inject subcutaneously 65 units every night at bedtime (Patient taking differently: Inject subcutaneously 60 units every night at bedtime) 60 mL 3  . levothyroxine (SYNTHROID, LEVOTHROID) 125 MCG tablet Take 1 tablet (125 mcg total) by mouth daily. 90 tablet 3  . linagliptin (TRADJENTA) 5 MG TABS tablet Take 1 tablet (5 mg total) by mouth daily. 90 tablet 3  . metFORMIN (GLUCOPHAGE) 850 MG tablet Take 1 tablet by mouth twice a day 180 tablet 3  . metoprolol (TOPROL-XL) 200 MG 24 hr tablet Take 1 tablet (200 mg total) by mouth daily. Take with or immediately following a meal. 90 tablet 3  . niacin (NIASPAN) 1000 MG CR tablet Take 1 tablet by mouth every day 90 tablet 3  . nitroGLYCERIN (NITROSTAT) 0.4 MG SL tablet Place 1 tablet (0.4 mg total) under the tongue every 5 (five) minutes as needed for chest pain. 25 tablet 1  . SYRINGE/NEEDLE, DISP, 1 ML (B-D SYRINGE/NEEDLE 1CC/25GX5/8) 25G X 5/8" 1 ML  MISC As directed 100 each 5  . tiotropium (SPIRIVA) 18 MCG inhalation capsule Place 1 capsule into inhaler and inhale daily.     Marland Kitchen zolpidem (AMBIEN) 10 MG tablet Take 10 mg by mouth at bedtime as needed for sleep.     No current facility-administered medications for this visit.     Past Medical History  Diagnosis Date  . Diabetes mellitus   . Hypertension   . Hyperlipidemia   . Postinflammatory pulmonary fibrosis   . CAD (coronary artery disease)   . Pneumothorax on right 4/11  . Sleep apnea     does not use CPAP   . COPD (chronic  obstructive pulmonary disease)   . Atrial tachycardia   . Acute diastolic heart failure     ROS:  All systems reviewed and negative except as noted in the HPI.   Past Surgical History  Procedure Laterality Date  . Coronary stent placement  03/05/2003    PCI & Stent LAD & mid CX  . Elbow surgery    . Rotator cuff repair    . Bilateral vats ablation      rt  . Cardiac catheterization  05/03/2007    patent stents  . Right knee surgery       2012   . Knee arthroscopy Right 01/22/2013    Procedure: RIGHT ARTHROSCOPY KNEE WITH DEBRIDEMENT, abrasion chondroplasty of lateral tibial plateau, debridement of partial tear of ACL, menisectomy; Surgeon: Jacki Cones, MD; Location: [facility]; Service: Orthopedics; Laterality: Right;  . Coronary angioplasty with stent placement  01/13/2010    PCI & stent to CX     Family History  Problem Relation Age of Onset  . Breast cancer Mother   . Lung cancer Father   . Diabetes type II Mother      History   Social History  . Marital Status: Divorced    Spouse Name: N/A  . Number of Children: 1  . Years of Education: N/A   Occupational History  .  [employer]   Social History Main Topics  . Smoking status: Former Smoker -- 1.50 packs/day for 35 years    Types: Cigarettes    Quit date: 10/24/2007  . Smokeless tobacco: Never Used  . Alcohol Use: 0.0 oz/week    0 Standard drinks or equivalent per week     Comment: social  . Drug Use: No  . Sexual Activity: Not on file   Other Topics Concern  . Not on file   Social History Narrative     BP 146/82 mmHg  Pulse 86  Ht 6\' 1"  (1.854 m)  Wt 263 lb (119.296 kg)  BMI 34.71 kg/m2  Physical Exam:  stable appearing middle aged man, wearing oxygen, NAD HEENT: Unremarkable Neck: 7 cm JVD, no thyromegally Lymphatics: No  adenopathy Back: No CVA tenderness Lungs: Clear with no wheezes or rhonchi. He has scattered rales. HEART: Regular rate rhythm, no murmurs, no rubs, no clicks Abd: soft, obese, positive bowel sounds, no organomegally, no rebound, no guarding Ext: 2 plus pulses, no edema, no cyanosis, no clubbing Skin: No rashes no nodules Neuro: CN II through XII intact, motor grossly intact  EKG - nsr with WPW  Assess/Plan:            SVT (supraventricular tachycardia) - [name], MD at 03/11/2015 1:25 PM     Status: Written Related Problem: SVT (supraventricular tachycardia)   Expand All Collapse All   He has a long RP tachycardia  at rates of up to 200/min, stopping with IV adenosine. He has ventricular pre-excitation on his ECG although the pattern is a bit atypical. With the development of LBBB in tachycardia, his SVT slows. I have discussed the treatment options with the patient and his daughter and the risks/benefits/goals/expectations of catheter ablation has been discussed and he wishes to proceed.             PULMONARY FIBROSIS - [name], MD at 03/11/2015 1:26 PM     Status: Written Related Problem: PULMONARY FIBROSIS   Expand All Collapse All   Note results of his CT scan. He will continue oxygen and bronchodilators.             WPW syndrome - [name], MD at 03/11/2015 1:27 PM     Status: Written Related Problem: WPW syndrome   Expand All Collapse All   He has WPW syndrome. Will schedule cathter ablation       Since prior clinic visit, no change in the history, exam, assessment and plan. For catheter ablation today.   Jerry Montgomery, M.D.

## 2015-04-14 NOTE — Discharge Instructions (Signed)
No driving for 3 days. No lifting over 5 lbs for 1 week. No sexual activity for 1 week. You may return to work in 1 week.  Keep procedure site clean & dry. If you notice increased pain, swelling, bleeding or pus, call/return!  You may shower, but no soaking baths/hot tubs/pools for 1 week.  ° ° °

## 2015-04-14 NOTE — Progress Notes (Signed)
Procedure explained to patient and Rt femoral arterial and venous  access site assessed: level 0, palpable posterior tibial pulses. 8French arterial sheath and 6,6,8 French venous sheaths removed and manual pressure applied for 30 minutes. Pre, peri, & post procedural vitals: HR 66, RR 15, O2 Sat upper 95, BP 121/70, Pain 0. Distal pulses remained intact after sheath removal. Access site level 0 and dressed with 4X4 gauze and tegaderm. Post procedural instructions discussed and return demonstration from patient.

## 2015-04-14 NOTE — Progress Notes (Signed)
Pt. Denies complaint of pain, blood glucose via finger stick 120; awaiting ACT for sheath removal. IV Site in LH intact, no reddness, no edema to site IV fluid infusing at  10cc/hr.

## 2015-04-15 ENCOUNTER — Encounter (HOSPITAL_COMMUNITY): Payer: Self-pay | Admitting: General Practice

## 2015-04-15 DIAGNOSIS — I456 Pre-excitation syndrome: Secondary | ICD-10-CM

## 2015-04-15 DIAGNOSIS — J8410 Pulmonary fibrosis, unspecified: Secondary | ICD-10-CM | POA: Diagnosis not present

## 2015-04-15 DIAGNOSIS — I471 Supraventricular tachycardia: Secondary | ICD-10-CM | POA: Diagnosis not present

## 2015-04-15 DIAGNOSIS — E119 Type 2 diabetes mellitus without complications: Secondary | ICD-10-CM | POA: Diagnosis not present

## 2015-04-15 LAB — GLUCOSE, CAPILLARY: GLUCOSE-CAPILLARY: 172 mg/dL — AB (ref 65–99)

## 2015-04-15 MED ORDER — METOPROLOL SUCCINATE ER 50 MG PO TB24
50.0000 mg | ORAL_TABLET | Freq: Every day | ORAL | Status: DC
Start: 2015-04-15 — End: 2015-06-21

## 2015-04-15 MED FILL — Heparin Sodium (Porcine) 2 Unit/ML in Sodium Chloride 0.9%: INTRAMUSCULAR | Qty: 500 | Status: AC

## 2015-04-20 NOTE — Telephone Encounter (Signed)
Hi Greg  Were you able to get right heart cath?  If so, what are the nmbers?  Thanks  Dr. Kalman ShanMurali Unnamed Zeien, M.D., [facility].C.C.P Pulmonary and Critical Care Medicine Staff Physician [facility] [place] [facility] Pager: [phone], If no answer or between  15:00h - 7:00h: call [phone]  04/20/2015 3:43 PM

## 2015-04-27 NOTE — Telephone Encounter (Signed)
Murali,   I regret that I did not. I totally missed it. His CVP was low however, certainly less than 10. His PA systolic was 48 a year ago by echo. We can either repeat his echo or I can talk to him about having a right heart cath done when he comes back for followup in a few weeks. He had a fairly difficult to ablate left posterior WPW pathway which was successfully ablated.  Sharlot GowdaGregg

## 2015-04-29 ENCOUNTER — Telehealth: Payer: Self-pay | Admitting: Family Medicine

## 2015-04-29 MED ORDER — LOSARTAN POTASSIUM 100 MG PO TABS: 100.0000 mg | ORAL_TABLET | Freq: Every day | ORAL | Status: DC

## 2015-04-29 MED ORDER — LINAGLIPTIN 5 MG PO TABS: 5.0000 mg | ORAL_TABLET | Freq: Every day | ORAL | Status: DC

## 2015-04-29 MED ORDER — ATORVASTATIN CALCIUM 40 MG PO TABS: ORAL_TABLET | ORAL | Status: DC

## 2015-04-29 NOTE — Telephone Encounter (Signed)
Medication called/sent to requested pharmacy  

## 2015-04-30 NOTE — Telephone Encounter (Signed)
Tammy SoursGreg  As discussed 2 d ago: yes plese do right heart cath.   Verlan FriendsElise  - Plesae give Mr Marisa Sprinkleshipps fu in 2 months or so  Thanks  Dr. Kalman ShanMurali Sonya Gunnoe, M.D., [facility]C.C.P Pulmonary and Critical Care Medicine Staff Physician [facility] [place] [facility] Pager: [phone], If no answer or between  15:00h - 7:00h: call [phone]  04/30/2015 5:31 PM

## 2015-05-03 NOTE — Telephone Encounter (Signed)
ATC pt. Line busy. WCB 

## 2015-05-04 NOTE — Telephone Encounter (Signed)
lmtcb for pt.  

## 2015-05-07 NOTE — Telephone Encounter (Signed)
Called and spoke to pt. Appt made with MR on 9.6.16. Pt verbalized understanding and denied any further questions or concerns at this time.

## 2015-05-12 ENCOUNTER — Encounter: Payer: Self-pay | Admitting: Internal Medicine

## 2015-05-12 ENCOUNTER — Ambulatory Visit (INDEPENDENT_AMBULATORY_CARE_PROVIDER_SITE_OTHER): Payer: Medicare Other | Admitting: Internal Medicine

## 2015-05-12 ENCOUNTER — Encounter: Payer: Self-pay | Admitting: *Deleted

## 2015-05-12 VITALS — BP 128/82 | HR 53 | Ht 73.5 in | Wt 261.4 lb

## 2015-05-12 DIAGNOSIS — J8410 Pulmonary fibrosis, unspecified: Secondary | ICD-10-CM | POA: Diagnosis not present

## 2015-05-12 DIAGNOSIS — I471 Supraventricular tachycardia: Secondary | ICD-10-CM

## 2015-05-12 DIAGNOSIS — I272 Pulmonary hypertension, unspecified: Secondary | ICD-10-CM

## 2015-05-12 DIAGNOSIS — I270 Primary pulmonary hypertension: Secondary | ICD-10-CM | POA: Diagnosis not present

## 2015-05-12 DIAGNOSIS — I5031 Acute diastolic (congestive) heart failure: Secondary | ICD-10-CM | POA: Diagnosis not present

## 2015-05-12 DIAGNOSIS — J9611 Chronic respiratory failure with hypoxia: Secondary | ICD-10-CM

## 2015-05-12 LAB — BASIC METABOLIC PANEL
BUN: 24 mg/dL — AB (ref 6–23)
CALCIUM: 9.5 mg/dL (ref 8.4–10.5)
CHLORIDE: 104 meq/L (ref 96–112)
CO2: 26 mEq/L (ref 19–32)
Creatinine, Ser: 1.45 mg/dL (ref 0.40–1.50)
GFR: 52.51 mL/min — ABNORMAL LOW (ref >60.00)
Glucose, Bld: 158 mg/dL — ABNORMAL HIGH (ref 70–99)
Potassium: 4.4 mEq/L (ref 3.5–5.1)
SODIUM: 139 meq/L (ref 135–145)

## 2015-05-12 LAB — CBC
HCT: 42.2 % (ref 39.0–52.0)
HEMOGLOBIN: 14.3 g/dL (ref 13.0–17.0)
MCHC: 33.8 g/dL (ref 30.0–36.0)
MCV: 88.3 fl (ref 78.0–100.0)
PLATELETS: 202 10*3/uL (ref 150.0–400.0)
RBC: 4.78 Mil/uL (ref 4.22–5.81)
RDW: 14.5 % (ref 11.5–15.5)
WBC: 7.9 10*3/uL (ref 4.0–10.5)

## 2015-05-12 LAB — PROTIME-INR
INR: 1 ratio (ref 0.8–1.0)
Prothrombin Time: 10.7 s (ref 9.6–13.1)

## 2015-05-12 NOTE — Assessment & Plan Note (Signed)
The patient has a history of pulmonary hypertension by echo. It is unclear whether this represents associated left-sided elevated pressures or not. In addition would do not know his pulmonary or systemic vascular resistance. We have recommended that we proceed with right heart catheterization to better understand this.

## 2015-05-12 NOTE — Assessment & Plan Note (Signed)
He remains on supplemental oxygen. In addition, he is on broncho-dilators. He will follow-up with his primary pulmonologist.

## 2015-05-12 NOTE — Assessment & Plan Note (Signed)
The patient has had no recurrent SVT and has no evidence of accessory pathway conduction on his ECG today. He will undergo watchful waiting.

## 2015-05-12 NOTE — Patient Instructions (Addendum)
Medication Instructions:    Your physician recommends that you continue on your current medications as directed. Please refer to the Current Medication list given to you today.   Labwork: CBC AND BMET  PT /INR  Testing/Procedures:  Cardiac catheterization is used to diagnose and/or treat various heart conditions. Doctors may recommend this procedure for a number of different reasons. The most common reason is to evaluate chest pain. Chest pain can be a symptom of coronary artery disease (CAD), and cardiac catheterization can show whether plaque is narrowing or blocking your heart's arteries. This procedure is also used to evaluate the valves, as well as measure the blood flow and oxygen levels in different parts of your heart. For further information please visit [email or web address]. Please follow instruction sheet, as given.   Follow-Up:  POST HOSPITAL FOLLOW UP WILL BE WITH DR [name] AFTER PROCEDURE    Any Other Special Instructions Will Be Listed Below (If Applicable).

## 2015-05-12 NOTE — Progress Notes (Signed)
HPI [name] returns today for follow-up of Wolff-Parkinson-White syndrome and SVT. He is a very pleasant 59 year old man who also has a diagnosis of pulmonary fibrosis. He has had long-standing tachycardia palpitations and ventricular preexcitation for which she underwent electrophysiologic study and catheter ablation of a left posterior accessory pathway. Since his ablation, he has had no recurrent arrhythmias. He does have baseline chronic dyspnea secondary to his lung disease. By echo, he has had elevation of pulmonary pressures. I've discussed these issues with his pulmonologist Dr. Marchelle Montgomery, who is recommended right heart catheterization. This would be to better understand his pulmonary vascular resistance and left sided heart pressures. Allergies  Allergen Reactions  . Altace [Ramipril]     Cough      Current Outpatient Prescriptions  Medication Sig Dispense Refill  . ACCU-CHEK SOFTCLIX LANCETS lancets USE AS INSTRUCTED 100 each 1  . ADVAIR HFA 115-21 MCG/ACT inhaler Inhale 2 puffs into the lungs 2 (two) times daily.    Marland Kitchen. albuterol (PROVENTIL HFA;VENTOLIN HFA) 108 (90 BASE) MCG/ACT inhaler Inhale 2 puffs into the lungs every 6 (six) hours as needed for wheezing. 1 Inhaler 0  . ALPRAZolam (XANAX) 0.5 MG tablet Take 1 tablet (0.5 mg total) by mouth 3 (three) times daily as needed for anxiety. 30 tablet 0  . aspirin 81 MG tablet Take 81 mg by mouth daily.    Marland Kitchen. atorvastatin (LIPITOR) 40 MG tablet Take 1 tablet by mouth  every night at bedtime 90 tablet 3  . B-D ULTRAFINE III SHORT PEN 31G X 8 MM MISC Use as directed 240 each 3  . clopidogrel (PLAVIX) 75 MG tablet Take 1 tablet by mouth  every day 90 tablet 3  . diltiazem (CARDIZEM) 30 MG tablet Take 1 tablet (30 mg total) by mouth every 6 (six) hours. 360 tablet 3  . furosemide (LASIX) 20 MG tablet Take 1 tablet (20 mg total) by mouth daily as needed for fluid (fluid). 90 tablet 3  . glucose blood (ACCU-CHEK AVIVA PLUS) test  strip Check bs 4-5 times per day DX-250.00 100 each 11  . Insulin Glargine (LANTUS SOLOSTAR) 100 UNIT/ML Solostar Pen Inject 60 Units into the skin at bedtime.    . insulin lispro (HUMALOG) 100 UNIT/ML KiwkPen Inject 10units into the skin with breakfast and lunch, and inject 20units into the skin with dinner. 30 mL 11  . isosorbide mononitrate (IMDUR) 60 MG 24 hr tablet Take 1 and 1/2 tablets by  mouth every day 135 tablet 1  . levothyroxine (SYNTHROID, LEVOTHROID) 125 MCG tablet Take 1 tablet (125 mcg total) by mouth daily. 90 tablet 3  . linagliptin (TRADJENTA) 5 MG TABS tablet Take 1 tablet (5 mg total) by mouth daily. 90 tablet 3  . losartan (COZAAR) 100 MG tablet Take 1 tablet (100 mg total) by mouth daily. 90 tablet 3  . metFORMIN (GLUCOPHAGE) 850 MG tablet Take 1 tablet by mouth  twice a day 180 tablet 3  . metoprolol succinate (TOPROL-XL) 50 MG 24 hr tablet Take 1 tablet (50 mg total) by mouth daily. Take with or immediately following a meal. 30 tablet 1  . nitroGLYCERIN (NITROSTAT) 0.4 MG SL tablet Place 1 tablet (0.4 mg total) under the tongue every 5 (five) minutes as needed for chest pain. 25 tablet 1  . SYRINGE/NEEDLE, DISP, 1 ML (B-D SYRINGE/NEEDLE 1CC/25GX5/8) 25G X 5/8" 1 ML MISC As directed 100 each 5  . tiotropium (SPIRIVA) 18 MCG inhalation capsule Place 1 capsule into  inhaler and inhale daily.     . zolpidem (AMBIEN) 10 MG tablet Take 1 tablet (10 mg total) by mouth at bedtime as needed for sleep. 90 tablet 0   No current facility-administered medications for this visit.     Past Medical History  Diagnosis Date  . Hypertension   . Hyperlipidemia   . Postinflammatory pulmonary fibrosis   . CAD (coronary artery disease)   . Pneumothorax on right 4/11  . COPD (chronic obstructive pulmonary disease)   . Atrial tachycardia   . Acute diastolic heart failure   . On home oxygen therapy     "3L; 24/7" (04/15/2015)  . Sleep apnea     "won't use CPAP" (04/14/2105)  .  Hypothyroidism   . Type II diabetes mellitus     ROS:   All systems reviewed and negative except as noted in the HPI.   Past Surgical History  Procedure Laterality Date  . Coronary angioplasty with stent placement  03/05/2003; 01/13/2010    PCI & Stent  LAD & mid CX; stent to CX  (I've got 4 stents total" (04/15/2015)  . Shoulder arthroscopy w/ rotator cuff repair Bilateral   . Bilateral vats ablation Right   . Cardiac catheterization  05/03/2007    patent stents  . Knee surgery  2012    "reattached quad"  . Knee arthroscopy Right 01/22/2013    Procedure: RIGHT ARTHROSCOPY KNEE WITH DEBRIDEMENT, abrasion chondroplasty of lateral tibial plateau, debridement of partial tear of ACL, menisectomy;  Surgeon: Ronald A Gioffre, MD;  Location: [facility];  Service: Orthopedics;  Laterality: Right;  . Electrophysiologic study N/A 04/14/2015    Procedure: SVT Ablation;  Surgeon: Gregg W Taylor, MD;  Location: [facility] INVASIVE CV [facility];  Service: Cardiovascular;  Laterality: N/A;     Family History  Problem Relation Age of Onset  . Breast cancer Mother   . Lung cancer Father   . Diabetes type II Mother      History   Social History  . Marital Status: Divorced    Spouse Name: N/A  . Number of Children: 1  . Years of Education: N/A   Occupational History  .  [employer]   Social History Main Topics  . Smoking status: Former Smoker -- 1.50 packs/day for 35 years    Types: Cigarettes    Quit date: 10/24/2007  . Smokeless tobacco: Never Used  . Alcohol Use: 0.0 oz/week    0 Standard drinks or equivalent per week     Comment: 04/15/2015 "I've had a couple drinks in the last 8 months"  . Drug Use: No  . Sexual Activity: Not Currently   Other Topics Concern  . Not on file   Social History Narrative     BP 128/82 mmHg  Pulse 53  Ht 6' 1.5" (1.867 m)  Wt 261 lb 6.4 oz (118.57 kg)  BMI 34.02 kg/m2  Physical Exam:  stable appearing middle aged man, wearing oxygen, NAD HEENT:  Unremarkable Neck:  7 cm JVD, no thyromegally Back:  No CVA tenderness Lungs:  Clear with no wheezes or rhonchi. He has scattered rales. HEART:  Regular rate rhythm, no murmurs, no rubs, no clicks Abd:  soft, obese, positive bowel sounds, no organomegally, no rebound, no guarding Ext:  2 plus pulses, trace peripheral edema, no cyanosis, no clubbing Skin:  No rashes no nodules Neuro:  CN II through XII intact, motor grossly intact  EKG - nsr with no ventricular preexcitation  Assess/Plan: 

## 2015-05-18 ENCOUNTER — Ambulatory Visit (HOSPITAL_COMMUNITY)
Admission: RE | Admit: 2015-05-18 | Discharge: 2015-05-18 | Disposition: A | Discharge status: Home / Self Care | Payer: Medicare Other | Source: Ambulatory Visit | Attending: Interventional Cardiology | Admitting: Interventional Cardiology

## 2015-05-18 ENCOUNTER — Encounter (HOSPITAL_COMMUNITY)
Admission: RE | Disposition: A | Discharge status: Home / Self Care | Payer: Self-pay | Source: Ambulatory Visit | Attending: Interventional Cardiology

## 2015-05-18 ENCOUNTER — Encounter (HOSPITAL_COMMUNITY): Payer: Self-pay | Admitting: Interventional Cardiology

## 2015-05-18 DIAGNOSIS — E119 Type 2 diabetes mellitus without complications: Secondary | ICD-10-CM | POA: Diagnosis not present

## 2015-05-18 DIAGNOSIS — I456 Pre-excitation syndrome: Secondary | ICD-10-CM | POA: Diagnosis not present

## 2015-05-18 DIAGNOSIS — I2510 Atherosclerotic heart disease of native coronary artery without angina pectoris: Secondary | ICD-10-CM | POA: Insufficient documentation

## 2015-05-18 DIAGNOSIS — Z7902 Long term (current) use of antithrombotics/antiplatelets: Secondary | ICD-10-CM | POA: Diagnosis not present

## 2015-05-18 DIAGNOSIS — E785 Hyperlipidemia, unspecified: Secondary | ICD-10-CM | POA: Diagnosis not present

## 2015-05-18 DIAGNOSIS — I471 Supraventricular tachycardia: Secondary | ICD-10-CM | POA: Diagnosis not present

## 2015-05-18 DIAGNOSIS — Z955 Presence of coronary angioplasty implant and graft: Secondary | ICD-10-CM | POA: Diagnosis not present

## 2015-05-18 DIAGNOSIS — I5031 Acute diastolic (congestive) heart failure: Secondary | ICD-10-CM | POA: Insufficient documentation

## 2015-05-18 DIAGNOSIS — Z87891 Personal history of nicotine dependence: Secondary | ICD-10-CM | POA: Diagnosis not present

## 2015-05-18 DIAGNOSIS — J8410 Pulmonary fibrosis, unspecified: Secondary | ICD-10-CM | POA: Diagnosis present

## 2015-05-18 DIAGNOSIS — E669 Obesity, unspecified: Secondary | ICD-10-CM | POA: Insufficient documentation

## 2015-05-18 DIAGNOSIS — Z9981 Dependence on supplemental oxygen: Secondary | ICD-10-CM | POA: Insufficient documentation

## 2015-05-18 DIAGNOSIS — E039 Hypothyroidism, unspecified: Secondary | ICD-10-CM | POA: Insufficient documentation

## 2015-05-18 DIAGNOSIS — I10 Essential (primary) hypertension: Secondary | ICD-10-CM | POA: Insufficient documentation

## 2015-05-18 DIAGNOSIS — Z794 Long term (current) use of insulin: Secondary | ICD-10-CM | POA: Diagnosis not present

## 2015-05-18 DIAGNOSIS — Z6834 Body mass index (BMI) 34.0-34.9, adult: Secondary | ICD-10-CM | POA: Diagnosis not present

## 2015-05-18 DIAGNOSIS — J449 Chronic obstructive pulmonary disease, unspecified: Secondary | ICD-10-CM | POA: Insufficient documentation

## 2015-05-18 DIAGNOSIS — Z7982 Long term (current) use of aspirin: Secondary | ICD-10-CM | POA: Diagnosis not present

## 2015-05-18 DIAGNOSIS — Z7951 Long term (current) use of inhaled steroids: Secondary | ICD-10-CM | POA: Diagnosis not present

## 2015-05-18 HISTORY — PX: CARDIAC CATHETERIZATION: SHX172

## 2015-05-18 LAB — POCT I-STAT 3, VENOUS BLOOD GAS (G3P V)
ACID-BASE EXCESS: 2 mmol/L (ref 0.0–2.0)
Bicarbonate: 28.3 mEq/L — ABNORMAL HIGH (ref 20.0–24.0)
O2 Saturation: 69 %
PH VEN: 7.365 — AB (ref 7.250–7.300)
TCO2: 30 mmol/L (ref 0–100)
pCO2, Ven: 49.5 mmHg (ref 45.0–50.0)
pO2, Ven: 38 mmHg (ref 30.0–45.0)

## 2015-05-18 LAB — GLUCOSE, CAPILLARY
Glucose-Capillary: 106 mg/dL — ABNORMAL HIGH (ref 65–99)
Glucose-Capillary: 99 mg/dL (ref 65–99)

## 2015-05-18 SURGERY — RIGHT HEART CATH

## 2015-05-18 MED ORDER — FENTANYL CITRATE (PF) 100 MCG/2ML IJ SOLN
INTRAMUSCULAR | Status: AC
  Filled 2015-05-18: qty 2

## 2015-05-18 MED ORDER — SODIUM CHLORIDE 0.9 % IJ SOLN: 3.0000 mL | Freq: Two times a day (BID) | INTRAMUSCULAR | Status: DC

## 2015-05-18 MED ORDER — ACETAMINOPHEN 325 MG PO TABS: 650.0000 mg | ORAL_TABLET | ORAL | Status: DC | PRN

## 2015-05-18 MED ORDER — SODIUM CHLORIDE 0.9 % IV SOLN
INTRAVENOUS | Status: DC | PRN
  Administered 2015-05-18: 10 mL/h via INTRAVENOUS

## 2015-05-18 MED ORDER — ASPIRIN 81 MG PO CHEW
81.0000 mg | CHEWABLE_TABLET | ORAL | Status: AC
  Administered 2015-05-18: 81 mg via ORAL

## 2015-05-18 MED ORDER — MIDAZOLAM HCL 2 MG/2ML IJ SOLN
INTRAMUSCULAR | Status: DC | PRN
Start: 2015-05-18 — End: 2015-05-18
  Administered 2015-05-18: 1 mg via INTRAVENOUS

## 2015-05-18 MED ORDER — SODIUM CHLORIDE 0.9 % WEIGHT BASED INFUSION
3.0000 mL/kg/h | INTRAVENOUS | Status: AC
  Administered 2015-05-18: 3 mL/kg/h via INTRAVENOUS

## 2015-05-18 MED ORDER — SODIUM CHLORIDE 0.9 % IV SOLN: 250.0000 mL | INTRAVENOUS | Status: DC | PRN

## 2015-05-18 MED ORDER — FENTANYL CITRATE (PF) 100 MCG/2ML IJ SOLN
INTRAMUSCULAR | Status: DC | PRN
  Administered 2015-05-18: 50 ug via INTRAVENOUS

## 2015-05-18 MED ORDER — LIDOCAINE HCL (PF) 1 % IJ SOLN
INTRAMUSCULAR | Status: AC
  Filled 2015-05-18: qty 30

## 2015-05-18 MED ORDER — MIDAZOLAM HCL 2 MG/2ML IJ SOLN
INTRAMUSCULAR | Status: AC
  Filled 2015-05-18: qty 2

## 2015-05-18 MED ORDER — LIDOCAINE HCL (PF) 1 % IJ SOLN
INTRAMUSCULAR | Status: DC | PRN
  Administered 2015-05-18: 10:00:00

## 2015-05-18 MED ORDER — SODIUM CHLORIDE 0.9 % IJ SOLN: 3.0000 mL | INTRAMUSCULAR | Status: DC | PRN

## 2015-05-18 MED ORDER — SODIUM CHLORIDE 0.9 % IV SOLN: INTRAVENOUS | Status: AC

## 2015-05-18 MED ORDER — SODIUM CHLORIDE 0.9 % WEIGHT BASED INFUSION: 1.0000 mL/kg/h | INTRAVENOUS | Status: DC

## 2015-05-18 MED ORDER — HEPARIN (PORCINE) IN NACL 2-0.9 UNIT/ML-% IJ SOLN
INTRAMUSCULAR | Status: AC
  Filled 2015-05-18: qty 500

## 2015-05-18 MED ORDER — ASPIRIN 81 MG PO CHEW
CHEWABLE_TABLET | ORAL | Status: AC
  Administered 2015-05-18: 81 mg via ORAL
  Filled 2015-05-18: qty 1

## 2015-05-18 MED ORDER — ONDANSETRON HCL 4 MG/2ML IJ SOLN: 4.0000 mg | Freq: Four times a day (QID) | INTRAMUSCULAR | Status: DC | PRN

## 2015-05-18 SURGICAL SUPPLY — 9 items
CATH SWAN GANZ 7F STRAIGHT (CATHETERS) ×3 IMPLANT
PACK CARDIAC CATHETERIZATION (CUSTOM PROCEDURE TRAY) ×3 IMPLANT
PROTECTION STATION PRESSURIZED (MISCELLANEOUS) ×3
SHEATH PINNACLE 7F 10CM (SHEATH) ×3 IMPLANT
STATION PROTECTION PRESSURIZED (MISCELLANEOUS) ×1 IMPLANT
STOPCOCK MORSE 400PSI 3WAY (MISCELLANEOUS) ×6 IMPLANT
TRANSDUCER W/STOPCOCK (MISCELLANEOUS) ×3 IMPLANT
TUBING ART PRESS 72  MALE/FEM (TUBING) ×2
TUBING ART PRESS 72 MALE/FEM (TUBING) ×1 IMPLANT

## 2015-05-18 NOTE — Progress Notes (Signed)
Site area: rt groin fv sheath Site Prior to Removal:  Level 0 Pressure Applied For:  15 minutes Manual:   Yes   Patient Status During Pull:  stable Post Pull Site:  Level  0 Post Pull Instructions Given:  yes Post Pull Pulses Present: yes Dressing Applied:  tegaderm Bedrest begins @  1035

## 2015-05-18 NOTE — H&P (View-Only) (Signed)
HPI [name] returns today for follow-up of Wolff-Parkinson-White syndrome and SVT. He is a very pleasant 59 year old man who also has a diagnosis of pulmonary fibrosis. He has had long-standing tachycardia palpitations and ventricular preexcitation for which she underwent electrophysiologic study and catheter ablation of a left posterior accessory pathway. Since his ablation, he has had no recurrent arrhythmias. He does have baseline chronic dyspnea secondary to his lung disease. By echo, he has had elevation of pulmonary pressures. I've discussed these issues with his pulmonologist Dr. Marchelle Montgomery, who is recommended right heart catheterization. This would be to better understand his pulmonary vascular resistance and left sided heart pressures. Allergies  Allergen Reactions  . Altace [Ramipril]     Cough      Current Outpatient Prescriptions  Medication Sig Dispense Refill  . ACCU-CHEK SOFTCLIX LANCETS lancets USE AS INSTRUCTED 100 each 1  . ADVAIR HFA 115-21 MCG/ACT inhaler Inhale 2 puffs into the lungs 2 (two) times daily.    Marland Kitchen. albuterol (PROVENTIL HFA;VENTOLIN HFA) 108 (90 BASE) MCG/ACT inhaler Inhale 2 puffs into the lungs every 6 (six) hours as needed for wheezing. 1 Inhaler 0  . ALPRAZolam (XANAX) 0.5 MG tablet Take 1 tablet (0.5 mg total) by mouth 3 (three) times daily as needed for anxiety. 30 tablet 0  . aspirin 81 MG tablet Take 81 mg by mouth daily.    Marland Kitchen. atorvastatin (LIPITOR) 40 MG tablet Take 1 tablet by mouth  every night at bedtime 90 tablet 3  . B-D ULTRAFINE III SHORT PEN 31G X 8 MM MISC Use as directed 240 each 3  . clopidogrel (PLAVIX) 75 MG tablet Take 1 tablet by mouth  every day 90 tablet 3  . diltiazem (CARDIZEM) 30 MG tablet Take 1 tablet (30 mg total) by mouth every 6 (six) hours. 360 tablet 3  . furosemide (LASIX) 20 MG tablet Take 1 tablet (20 mg total) by mouth daily as needed for fluid (fluid). 90 tablet 3  . glucose blood (ACCU-CHEK AVIVA PLUS) test  strip Check bs 4-5 times per day DX-250.00 100 each 11  . Insulin Glargine (LANTUS SOLOSTAR) 100 UNIT/ML Solostar Pen Inject 60 Units into the skin at bedtime.    . insulin lispro (HUMALOG) 100 UNIT/ML KiwkPen Inject 10units into the skin with breakfast and lunch, and inject 20units into the skin with dinner. 30 mL 11  . isosorbide mononitrate (IMDUR) 60 MG 24 hr tablet Take 1 and 1/2 tablets by  mouth every day 135 tablet 1  . levothyroxine (SYNTHROID, LEVOTHROID) 125 MCG tablet Take 1 tablet (125 mcg total) by mouth daily. 90 tablet 3  . linagliptin (TRADJENTA) 5 MG TABS tablet Take 1 tablet (5 mg total) by mouth daily. 90 tablet 3  . losartan (COZAAR) 100 MG tablet Take 1 tablet (100 mg total) by mouth daily. 90 tablet 3  . metFORMIN (GLUCOPHAGE) 850 MG tablet Take 1 tablet by mouth  twice a day 180 tablet 3  . metoprolol succinate (TOPROL-XL) 50 MG 24 hr tablet Take 1 tablet (50 mg total) by mouth daily. Take with or immediately following a meal. 30 tablet 1  . nitroGLYCERIN (NITROSTAT) 0.4 MG SL tablet Place 1 tablet (0.4 mg total) under the tongue every 5 (five) minutes as needed for chest pain. 25 tablet 1  . SYRINGE/NEEDLE, DISP, 1 ML (B-D SYRINGE/NEEDLE 1CC/25GX5/8) 25G X 5/8" 1 ML MISC As directed 100 each 5  . tiotropium (SPIRIVA) 18 MCG inhalation capsule Place 1 capsule into  inhaler and inhale daily.     Marland Kitchen zolpidem (AMBIEN) 10 MG tablet Take 1 tablet (10 mg total) by mouth at bedtime as needed for sleep. 90 tablet 0   No current facility-administered medications for this visit.     Past Medical History  Diagnosis Date  . Hypertension   . Hyperlipidemia   . Postinflammatory pulmonary fibrosis   . CAD (coronary artery disease)   . Pneumothorax on right 4/11  . COPD (chronic obstructive pulmonary disease)   . Atrial tachycardia   . Acute diastolic heart failure   . On home oxygen therapy     "3L; 24/7" (04/15/2015)  . Sleep apnea     "won't use CPAP" (04/14/2105)  .  Hypothyroidism   . Type II diabetes mellitus     ROS:   All systems reviewed and negative except as noted in the HPI.   Past Surgical History  Procedure Laterality Date  . Coronary angioplasty with stent placement  03/05/2003; 01/13/2010    PCI & Stent  LAD & mid CX; stent to CX  (I've got 4 stents total" (04/15/2015)  . Shoulder arthroscopy w/ rotator cuff repair Bilateral   . Bilateral vats ablation Right   . Cardiac catheterization  05/03/2007    patent stents  . Knee surgery  2012    "reattached quad"  . Knee arthroscopy Right 01/22/2013    Procedure: RIGHT ARTHROSCOPY KNEE WITH DEBRIDEMENT, abrasion chondroplasty of lateral tibial plateau, debridement of partial tear of ACL, menisectomy;  Surgeon: Jacki Cones, MD;  Location: [facility];  Service: Orthopedics;  Laterality: Right;  . Electrophysiologic study N/A 04/14/2015    Procedure: SVT Ablation;  Surgeon: [name], MD;  Location: [facility] IN[facility] CV LAB;  Service: Cardiovascular;  Laterality: N/A;     Family History  Problem Relation Age of Onset  . Breast cancer Mother   . Lung cancer Father   . Diabetes type II Mother      History   Social History  . Marital Status: Divorced    Spouse Name: N/A  . Number of Children: 1  . Years of Education: N/A   Occupational History  .  [employer]   Social History Main Topics  . Smoking status: Former Smoker -- 1.50 packs/day for 35 years    Types: Cigarettes    Quit date: 10/24/2007  . Smokeless tobacco: Never Used  . Alcohol Use: 0.0 oz/week    0 Standard drinks or equivalent per week     Comment: 04/15/2015 "I've had a couple drinks in the last 8 months"  . Drug Use: No  . Sexual Activity: Not Currently   Other Topics Concern  . Not on file   Social History Narrative     BP 128/82 mmHg  Pulse 53  Ht 6' 1.5" (1.867 m)  Wt 261 lb 6.4 oz (118.57 kg)  BMI 34.02 kg/m2  Physical Exam:  stable appearing middle aged man, wearing oxygen, NAD HEENT:  Unremarkable Neck:  7 cm JVD, no thyromegally Back:  No CVA tenderness Lungs:  Clear with no wheezes or rhonchi. He has scattered rales. HEART:  Regular rate rhythm, no murmurs, no rubs, no clicks Abd:  soft, obese, positive bowel sounds, no organomegally, no rebound, no guarding Ext:  2 plus pulses, trace peripheral edema, no cyanosis, no clubbing Skin:  No rashes no nodules Neuro:  CN II through XII intact, motor grossly intact  EKG - nsr with no ventricular preexcitation  Assess/Plan:

## 2015-05-18 NOTE — Interval H&P Note (Signed)
Cath Lab Visit (complete for each Cath Lab visit)  Clinical Evaluation Leading to the Procedure:   ACS: No.  Non-ACS:    Anginal Classification: No Symptoms  Anti-ischemic medical therapy: No Therapy  Non-Invasive Test Results: No non-invasive testing performed  Prior CABG: No previous CABG      History and Physical Interval Note:  05/18/2015 9:23 AM  Jerry Montgomery  has presented today for surgery, with the diagnosis of hp  The various methods of treatment have been discussed with the patient and family. After consideration of risks, benefits and other options for treatment, the patient has consented to  Procedure(s): Right Heart Cath (N/A) as a surgical intervention .  The patient's history has been reviewed, patient examined, no change in status, stable for surgery.  I have reviewed the patient's chart and labs.  Questions were answered to the patient's satisfaction.     Lesleigh Noe

## 2015-05-18 NOTE — Discharge Instructions (Signed)
Venogram, Care After °Refer to this sheet in the next few weeks. These instructions provide you with information on caring for yourself after your procedure. Your health care provider may also give you more specific instructions. Your treatment has been planned according to current medical practices, but problems sometimes occur. Call your health care provider if you have any problems or questions after your procedure. °WHAT TO EXPECT AFTER THE PROCEDURE °After your procedure, it is typical to have the following sensations: °· Mild discomfort at the catheter insertion site. °HOME CARE INSTRUCTIONS  °· Take all medicines exactly as directed. °· Follow any prescribed diet. °· Follow instructions regarding both rest and physical activity. °· Drink more fluids for the first several days after the procedure in order to help flush dye from your kidneys. °SEEK MEDICAL CARE IF: °· You develop a rash. °· You have fever not controlled by medicine. °SEEK IMMEDIATE MEDICAL CARE IF: °· There is pain, drainage, bleeding, redness, swelling, warmth or a red streak at the site of the IV tube. °· The extremity where your IV tube was placed becomes discolored, numb, or cool. °· You have difficulty breathing or shortness of breath. °· You develop chest pain. °· You have excessive dizziness or fainting. °Document Released: 07/30/2013 Document Revised: 10/14/2013 Document Reviewed: 07/30/2013 °Exit[employer]® Patient Information ©2015 [employer]. This information is not intended to replace advice given to you by your health care provider. Make sure you discuss any questions you have with your health care provider. ° °

## 2015-06-21 ENCOUNTER — Other Ambulatory Visit: Payer: Self-pay | Admitting: Family Medicine

## 2015-06-21 ENCOUNTER — Telehealth: Payer: Self-pay | Admitting: Family Medicine

## 2015-06-21 DIAGNOSIS — F418 Other specified anxiety disorders: Secondary | ICD-10-CM

## 2015-06-21 MED ORDER — CLOPIDOGREL BISULFATE 75 MG PO TABS: ORAL_TABLET | ORAL | Status: DC

## 2015-06-21 MED ORDER — METOPROLOL SUCCINATE ER 200 MG PO TB24: 200.0000 mg | ORAL_TABLET | Freq: Every day | ORAL | Status: DC

## 2015-06-21 MED ORDER — ZOLPIDEM TARTRATE 10 MG PO TABS: 10.0000 mg | ORAL_TABLET | Freq: Every evening | ORAL | Status: DC | PRN

## 2015-06-21 MED ORDER — ALPRAZOLAM 0.5 MG PO TABS: 0.5000 mg | ORAL_TABLET | Freq: Three times a day (TID) | ORAL | Status: DC | PRN

## 2015-06-21 NOTE — Telephone Encounter (Signed)
Ok refill Zolpidem #90 to mail order?

## 2015-06-21 NOTE — Telephone Encounter (Signed)
Medication called/sent to requested pharmacy and pt aware 

## 2015-06-21 NOTE — Telephone Encounter (Signed)
ok 

## 2015-06-21 NOTE — Telephone Encounter (Signed)
Pt is in the doughnut hole on his RX's and would like to know if we could send in his requested rx's to [facility] as they are much chearper. Requesting a refill on Xanax, Ambien,Plavix and Metoprolol. ? OK to Refill

## 2015-06-21 NOTE — Telephone Encounter (Signed)
I am ok with all of those.

## 2015-06-22 ENCOUNTER — Other Ambulatory Visit: Payer: Self-pay

## 2015-06-22 MED ORDER — ATORVASTATIN CALCIUM 40 MG PO TABS: ORAL_TABLET | ORAL | Status: DC

## 2015-06-22 NOTE — Telephone Encounter (Signed)
Medication refilled per protocol. 

## 2015-06-22 NOTE — Telephone Encounter (Signed)
Medication Detail      Disp Refills Start End     metoprolol (TOPROL XL) 200 MG 24 hr tablet 90 tablet 3 06/21/2015     Sig - Route: Take 1 tablet (200 mg total) by mouth daily. - Oral    E-Prescribing Status: Receipt confirmed by pharmacy (06/21/2015 3:49 PM EDT)     Pharmacy    [facility] - [place], [place] - [address]

## 2015-06-29 ENCOUNTER — Ambulatory Visit (INDEPENDENT_AMBULATORY_CARE_PROVIDER_SITE_OTHER): Payer: Medicare Other | Admitting: Internal Medicine

## 2015-06-29 ENCOUNTER — Encounter: Payer: Self-pay | Admitting: Family Medicine

## 2015-06-29 ENCOUNTER — Other Ambulatory Visit: Payer: Self-pay | Admitting: Family Medicine

## 2015-06-29 ENCOUNTER — Encounter: Payer: Self-pay | Admitting: Internal Medicine

## 2015-06-29 VITALS — BP 128/82 | HR 77 | Ht 73.0 in | Wt 263.0 lb

## 2015-06-29 DIAGNOSIS — J849 Interstitial pulmonary disease, unspecified: Secondary | ICD-10-CM | POA: Diagnosis not present

## 2015-06-29 MED ORDER — METFORMIN HCL 850 MG PO TABS: ORAL_TABLET | ORAL | Status: DC

## 2015-06-29 NOTE — Progress Notes (Signed)
Subjective:    Patient ID: Jerry Montgomery, male    DOB: Mar 04, 1956, 59 y.o.   MRN: [id number]  HPI     OV 03/09/2015  Chief Complaint  Patient presents with  . Follow-up    Pt here for HFU. Pt stated after seeing MW he has been seeing a pulmonologist at [facility].      59 year old male former smoker established patient of the practice with recent admission now referred to see me for interstitial lung disease. History is gathered from him and review of the old chart and outside record from [facility] [ Access to [facility] chart was limited]   quit smoking 2009   he was last seen in 2014 by Dr. Casimiro Needle wert my colleague. Sometime in  Late 2013 early 2014 he had suffered pneumothorax unspecified site hent status post VATS pleurodesis .  Then in August 2014it appeared CT of the chest showed  Interstitial lung disease ofspecific pattern and associated emphysema. He subsequently followed up at [facility]. I could not for some reason gain access to the chart but he did have a CT scan of the chest that in December 2014 that showed similar findings as here according to the report. ANA autoimmune panel was negative. It appears no other autoimmune panel was checked. Unclear pulmonary function test. Unclear physician opinion. But according to the patient in early 2015 was given a three-month trial of Esbriet. 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 Does not recollect a surgical lung biopsy. He took this for 3 months but due to side effects of fatigue stopped it. He says since then he's been on 3 L oxygen and is felt baseline.  Other issues: he was recommended pulmonary rehabilitation but never did it because of bad knee. Pulmonary transplantation was not considered an option because of his obesity  Most recently admitted a month ago between 02/02/2015 and  02/05/2015 her discharge summary shows a diagnosis of acute respiratory  failure his pulmonary infiltrates. He was never intubated but required high levels of oxygen. Suspicion was pulmonary edema associated with decompensated interstitial lung disease as reason for acute on chronic hypoxic respiratory failure. He was on amio gtt per chart on 02/02/15 but looks like this was stopped under advice of EP. He ws diuresed. Does not look like was given prednisone and discharged on baseline 3L o2  Of note, cxr from April 2016 compared to 2014 is markedly wose (personarlly reviewed film) but is unclear how much of the April 2016 is acute worsening v chronic proigression since 2014.  He tells me that he is feeling baseline and is currently at 3 L.    other pertinent lab review from the hospital  -  02/11/2015: Creatinine is 1.5 mg percent and might be his baseline the hemoglobin was 14.2 g percent.    OV 06/29/2015  Chief Complaint  Patient presents with  . Follow-up    Pt here after cardiac cath, autoimmune panel, HRCT, and PFTs. Pt states his breathing is unchanged since last OV. Pt states he has a seldom cough. Pt denies CP/tightness.      Follow-up evaluation of interstitial lung disease not otherwise specified. Here to discuss test results.  Test results are documented below. Interstitial lung disease is of moderate severity. On the CT scan of the chest there is some progression compared to 2014. Findings are inconsistent or against UIP. Autoimmune panel is negative essentially. Hypersensitivity panel is negative. Right  heart cath ruled out pulmonary hypertension. He continues to 3 L of oxygen. Subjectively reports no change. In discussion of surgical lung biopsy reported that in 2011 he suffered spontaneous pneumothorax with chest tube treatment this was followed by recurrence a month later. Dr. Karle Plumber then operated on him with a VATS pleurodesis. He did have a right apical lung biopsy that only showed emphysema and nonspecific inflammation. He did not have classic  interstitial lung disease biopsies at that time probably because he did not have interstitial lung disease at that time. We discussed biopsy risks and he is okay having the biopsy and accepting the risk. He says his son died at age 55 approximately 18 years ago and since then he is more philosophical. He would like to know what his interstitial lung diseases and his prognosis is.   [employer] of chest physicians interstitial lung disease questionnaire:   - Symptoms: Occasional and not bothersome cough with mild white mucus but no cough at night. -Past medical history: Reports in the positive for heart diseas, diabetes, edema and arthralgia, thyroid disease . Negative for autoimmune disease symptoms  - Personal exposure history does admit to a past history of smoking recreational drugs. He started smoking tobacco at age 26 and smokes 40 cigarettes per day and quit when he was age 22.  - Family history of lung disease: Negative for emphysema, asthma, sarcoid, cystic fibrosis, pulmonary fibrosis and hypersensitivity pneumonitis  - Home environmental exposure: Does not live in old house. Does admit to exposure to birds not otherwise specified. Denies humidifier, sound, hot tub, Jacuzzi  - Occupational history worked as a Counsellor for 1 year. They're also worked in [employer]. for 13 years during which time he was exposed to dust, pain, polyester. 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 and was exposed to paint can find particles other than this he endorses positive history for working in the form, painting, Management consultant, Veterinary surgeon, Librarian, academic, welding, and her, [place] and [employer] to workup - Other exposure history: He is been exposed to birds, feathers, insecticide, mood, industry of cleaning solution, detergent isocyanate, paint, cement, pipes, ceramic tiles - Medication exposure history denies any chemotherapy, radiation, medications that increase the odds of  pulmonary fibrosis    LABS   Autpimmune ab panel 03/09/15 and HP panel - all negative. C-anca trace positive but mpo/pr3 negative  PFT fvc 3.5L/64%, Ratio 72, TLC 6L/76%, DLCO 15/39% - moderate restriction. (Worse to stable  compared to 2013 when FVC was 4.1 L/78% and TLC was 6 L DLCO was 16/54%]  CT chest 03/18/2015  repirted 03/24/2015 i sAGAINST UIP and is worse compared to 2014. Other findings on CT - enlarged PA artery - echo 2014 showed PASP 40s and co art calcification - stress lexiscan 2014 was normal.   Right heart catheterization done 05/18/2015  - Report reviewed personally cardiac output 5.84 L/m. Cardiac index 2.45 L/m per BSA, pulmonary arteries with mean pressure 18 mm and a wedge of 7. Reported as normal   Walk test 185 feet 3 laps on room air  - WE left him on room air for 20 minutes and then walked him 185 feet 3 laps. He desaturated only on the second lap to below 88%.    Review of Systems  Constitutional: Negative for fever and unexpected weight change.  HENT: Negative for congestion, dental problem, ear pain, nosebleeds, postnasal drip, rhinorrhea, sinus pressure, sneezing, sore throat and trouble swallowing.   Eyes: Negative for  redness and itching.  Respiratory: Positive for cough and shortness of breath. Negative for chest tightness and wheezing.   Cardiovascular: Negative for palpitations and leg swelling.  Gastrointestinal: Negative for nausea and vomiting.  Genitourinary: Negative for dysuria.  Musculoskeletal: Negative for joint swelling.  Skin: Negative for rash.  Neurological: Negative for headaches.  Hematological: Does not bruise/bleed easily.  Psychiatric/Behavioral: Negative for dysphoric mood. The patient is not nervous/anxious.        Objective:   Physical Exam  Filed Vitals:   06/29/15 1338  BP: 128/82  Pulse: 77  Height:  (1.854 m)  Weight: 263 lb (119.296 kg)  SpO2: 97%   Brief exam Obese Clear to auscultation bilaterally in  the anterior side Alert and oriented 3 Oxygen on      Assessment & Plan:     ICD-9-CM ICD-10-CM   1. ILD (interstitial lung disease) 515 J84.9    Etiology: Unclear cause of interstitial lung disease - Clinically does not appear to be idiopathic pulmonary fibrosis. Exposure history provides basis for occupational exposure nonspecific interstitial lung disease versus hypersensitivity pneumonitis due to exposure to birds, feathers and laboratory work Severity: Disease severity is moderate based on pulmonary function test and desaturation with exercise Progression: Some worse on CT scan compared to 2014 but Pulmonary function test compared to 2013 produces a mixed picture  Plan  - Refer Dr. Tyrone Sage or Dorris Fetch for evaluation of surgical lung biopsy on the left side  - Given prior pleurodesis on the right side I suspect that the surgeons will not do a surgical lung biopsy of the right side. Therefore he will have have a surgical lung biopsy on the left side. He meets indications for surgical lung biopsy because of undifferentiated interstitial lung disease and only clinical pretest probably ready for IPF being low-moderate. Explained risks of surgical lung biopsy such as bronchial pleural fistula, infection, flareup of interstitial lung disease, pain in both short-term and long-term. He understand risks which I believe overall her low-moderate but we will have the surgeons evaluate and he can make an informed decision with them. He is willing to undergo the procedure   Follow-up  - 3-4 weeks after surgical lung biopsy    > 50% of this > 25 min visit spent in face to face counseling or coordination of care    Dr. Kalman Shan, M.D., [facility].C.P Pulmonary and Critical Care Medicine Staff Physician [facility] [place] [facility] Pager: [phone], If no answer or between  15:00h - 7:00h: call [phone]  06/29/2015 2:07 PM

## 2015-06-29 NOTE — Patient Instructions (Signed)
ICD-9-CM ICD-10-CM   1. ILD (interstitial lung disease) 515 J84.9     Unclear cause of interstitial lung disease Clinically does not appear to be idiopathic pulmonary fibrosis Some worse on CT scan compared to 2014   Plan  - Refer Dr. Tyrone Sage or Dorris Fetch for evaluation of surgical lung biopsy on the left side - Walk test on room air today  Follow-up  - 3-4 weeks after surgical lung biopsy

## 2015-06-29 NOTE — Telephone Encounter (Signed)
Medication refill for one time only.  Patient needs to be seen.  Letter sent for patient to call and schedule 

## 2015-06-30 ENCOUNTER — Telehealth: Payer: Self-pay | Admitting: Internal Medicine

## 2015-06-30 NOTE — Telephone Encounter (Signed)
Elise  Need to know which thoracic surgeon he is going to see and when  Thanks  Dr. Kalman Shan, M.D., [facility].C.P Pulmonary and Critical Care Medicine Staff Physician [facility] [place] [facility] Pager: [phone], If no answer or between  15:00h - 7:00h: call [phone]  06/30/2015 8:23 AM

## 2015-07-01 ENCOUNTER — Other Ambulatory Visit: Payer: Self-pay | Admitting: *Deleted

## 2015-07-01 NOTE — Telephone Encounter (Signed)
Called and spoke to pt's wife.  She stated that pt has not scheduled appt with thoracic surgeon She stated she would return our call when appt was made.

## 2015-07-06 ENCOUNTER — Ambulatory Visit: Payer: Medicare Other

## 2015-07-06 ENCOUNTER — Encounter: Payer: Medicare Other | Admitting: Cardiothoracic Surgery

## 2015-07-06 DIAGNOSIS — E039 Hypothyroidism, unspecified: Secondary | ICD-10-CM

## 2015-07-06 DIAGNOSIS — E1165 Type 2 diabetes mellitus with hyperglycemia: Secondary | ICD-10-CM

## 2015-07-06 DIAGNOSIS — I10 Essential (primary) hypertension: Secondary | ICD-10-CM

## 2015-07-06 DIAGNOSIS — Z79899 Other long term (current) drug therapy: Secondary | ICD-10-CM

## 2015-07-06 DIAGNOSIS — IMO0002 Reserved for concepts with insufficient information to code with codable children: Secondary | ICD-10-CM

## 2015-07-06 DIAGNOSIS — E785 Hyperlipidemia, unspecified: Secondary | ICD-10-CM

## 2015-07-06 LAB — COMPLETE METABOLIC PANEL WITH GFR
ALK PHOS: 60 U/L (ref 40–115)
ALT: 25 U/L (ref 9–46)
AST: 21 U/L (ref 10–35)
Albumin: 4.1 g/dL (ref 3.6–5.1)
BUN: 28 mg/dL — ABNORMAL HIGH (ref 7–25)
CALCIUM: 9.3 mg/dL (ref 8.6–10.3)
CO2: 23 mmol/L (ref 20–31)
Chloride: 105 mmol/L (ref 98–110)
Creat: 1.38 mg/dL — ABNORMAL HIGH (ref 0.70–1.25)
GFR, EST AFRICAN AMERICAN: 63 mL/min (ref >=60)
GFR, EST NON AFRICAN AMERICAN: 55 mL/min — AB (ref >=60)
Glucose, Bld: 122 mg/dL — ABNORMAL HIGH (ref 70–99)
POTASSIUM: 4.2 mmol/L (ref 3.5–5.3)
Sodium: 143 mmol/L (ref 135–146)
Total Bilirubin: 0.6 mg/dL (ref 0.2–1.2)
Total Protein: 6.7 g/dL (ref 6.1–8.1)

## 2015-07-06 LAB — LIPID PANEL
CHOLESTEROL: 131 mg/dL (ref 125–200)
HDL: 33 mg/dL — AB (ref >=40)
LDL Cholesterol: 62 mg/dL (ref <130)
Total CHOL/HDL Ratio: 4 Ratio (ref <=5.0)
Triglycerides: 181 mg/dL — ABNORMAL HIGH (ref <150)
VLDL: 36 mg/dL — ABNORMAL HIGH (ref <30)

## 2015-07-06 LAB — TSH: TSH: 2.328 u[IU]/mL (ref 0.350–4.500)

## 2015-07-07 ENCOUNTER — Encounter: Payer: Medicare Other | Admitting: Thoracic Surgery (Cardiothoracic Vascular Surgery)

## 2015-07-07 LAB — HEMOGLOBIN A1C
Hgb A1c MFr Bld: 8.6 % — ABNORMAL HIGH (ref <5.7)
Mean Plasma Glucose: 200 mg/dL — ABNORMAL HIGH (ref <117)

## 2015-07-07 LAB — CBC WITH DIFFERENTIAL/PLATELET
BASOS ABS: 0.1 10*3/uL (ref 0.0–0.1)
Basophils Relative: 1 % (ref 0–1)
EOS PCT: 3 % (ref 0–5)
Eosinophils Absolute: 0.2 10*3/uL (ref 0.0–0.7)
HCT: 41.7 % (ref 39.0–52.0)
HEMOGLOBIN: 14.1 g/dL (ref 13.0–17.0)
LYMPHS ABS: 1.3 10*3/uL (ref 0.7–4.0)
LYMPHS PCT: 19 % (ref 12–46)
MCH: 30.2 pg (ref 26.0–34.0)
MCHC: 33.8 g/dL (ref 30.0–36.0)
MCV: 89.3 fL (ref 78.0–100.0)
MPV: 9.5 fL (ref 8.6–12.4)
Monocytes Absolute: 1 10*3/uL (ref 0.1–1.0)
Monocytes Relative: 15 % — ABNORMAL HIGH (ref 3–12)
NEUTROS ABS: 4.3 10*3/uL (ref 1.7–7.7)
Neutrophils Relative %: 62 % (ref 43–77)
Platelets: 172 10*3/uL (ref 150–400)
RBC: 4.67 MIL/uL (ref 4.22–5.81)
RDW: 14.5 % (ref 11.5–15.5)
WBC: 6.9 10*3/uL (ref 4.0–10.5)

## 2015-07-08 ENCOUNTER — Ambulatory Visit (INDEPENDENT_AMBULATORY_CARE_PROVIDER_SITE_OTHER): Payer: Medicare Other | Admitting: Family Medicine

## 2015-07-08 ENCOUNTER — Encounter: Payer: Self-pay | Admitting: Family Medicine

## 2015-07-08 VITALS — BP 132/74 | HR 80 | Temp 98.1°F | Resp 20 | Ht 73.0 in | Wt 265.0 lb

## 2015-07-08 DIAGNOSIS — E1165 Type 2 diabetes mellitus with hyperglycemia: Secondary | ICD-10-CM | POA: Diagnosis not present

## 2015-07-08 DIAGNOSIS — Z23 Encounter for immunization: Secondary | ICD-10-CM | POA: Diagnosis not present

## 2015-07-08 DIAGNOSIS — IMO0002 Reserved for concepts with insufficient information to code with codable children: Secondary | ICD-10-CM

## 2015-07-08 MED ORDER — ACCU-CHEK SOFTCLIX LANCETS MISC: Status: DC

## 2015-07-08 NOTE — Telephone Encounter (Signed)
Pt is to see Dr. Dorris Fetch on 9.19.16 at 1530.   Will forward to MR as FYI.

## 2015-07-08 NOTE — Addendum Note (Signed)
Addended by: Legrand Rams B on: 07/08/2015 02:27 PM   Modules accepted: Orders

## 2015-07-08 NOTE — Progress Notes (Signed)
Subjective:    Patient ID: Jerry Montgomery, male    DOB: Jul 13, 1956, 59 y.o.   MRN: [id number]  HPI Patient is here today for follow-up of his chronic medical problems. His most recent lab work as listed below: Office Visit on 07/06/2015  Component Date Value Ref Range Status  . Sodium 07/06/2015 143  135 - 146 mmol/L Final  . Potassium 07/06/2015 4.2  3.5 - 5.3 mmol/L Final  . Chloride 07/06/2015 105  98 - 110 mmol/L Final  . CO2 07/06/2015 23  20 - 31 mmol/L Final  . Glucose, Bld 07/06/2015 122* 70 - 99 mg/dL Final  . BUN 07/06/2015 28* 7 - 25 mg/dL Final  . Creat 07/06/2015 1.38* 0.70 - 1.25 mg/dL Final  . Total Bilirubin 07/06/2015 0.6  0.2 - 1.2 mg/dL Final  . Alkaline Phosphatase 07/06/2015 60  40 - 115 U/L Final  . AST 07/06/2015 21  10 - 35 U/L Final  . ALT 07/06/2015 25  9 - 46 U/L Final  . Total Protein 07/06/2015 6.7  6.1 - 8.1 g/dL Final  . Albumin 07/06/2015 4.1  3.6 - 5.1 g/dL Final  . Calcium 07/06/2015 9.3  8.6 - 10.3 mg/dL Final  . GFR, Est African American 07/06/2015 63  >=60 mL/min Final  . GFR, Est Non African American 07/06/2015 55* >=60 mL/min Final   Comment:   The estimated GFR is a calculation valid for adults (>=79 years old) that uses the CKD-EPI algorithm to adjust for age and sex. It is   not to be used for children, pregnant women, hospitalized patients,    patients on dialysis, or with rapidly changing kidney function. According to the NKDEP, eGFR >89 is normal, 60-89 shows mild impairment, 30-59 shows moderate impairment, 15-29 shows severe impairment and <15 is ESRD.     . TSH 07/06/2015 2.328  0.350 - 4.500 uIU/mL Final  . Cholesterol 07/06/2015 131  125 - 200 mg/dL Final  . Triglycerides 07/06/2015 181* <150 mg/dL Final  . HDL 07/06/2015 33* >=40 mg/dL Final  . Total CHOL/HDL Ratio 07/06/2015 4.0  <=5.0 Ratio Final  . VLDL 07/06/2015 36* <30 mg/dL Final  . LDL Cholesterol 07/06/2015 62  <130 mg/dL Final   Comment:   Total Cholesterol/HDL  Ratio:CHD Risk                        Coronary Heart Disease Risk Table                                        Men       Women          1/2 Average Risk              3.4        3.3              Average Risk              5.0        4.4           2X Average Risk              9.6        7.1           3X Average Risk             23.4  11.0 Use the calculated Patient Ratio above and the CHD Risk table  to determine the patient's CHD Risk.   . WBC 07/06/2015 6.9  4.0 - 10.5 K/uL Final  . RBC 07/06/2015 4.67  4.22 - 5.81 MIL/uL Final  . Hemoglobin 07/06/2015 14.1  13.0 - 17.0 g/dL Final  . HCT 07/06/2015 41.7  39.0 - 52.0 % Final  . MCV 07/06/2015 89.3  78.0 - 100.0 fL Final  . MCH 07/06/2015 30.2  26.0 - 34.0 pg Final  . MCHC 07/06/2015 33.8  30.0 - 36.0 g/dL Final  . RDW 07/06/2015 14.5  11.5 - 15.5 % Final  . Platelets 07/06/2015 172  150 - 400 K/uL Final  . MPV 07/06/2015 9.5  8.6 - 12.4 fL Final  . Neutrophils Relative % 07/06/2015 62  43 - 77 % Final  . Neutro Abs 07/06/2015 4.3  1.7 - 7.7 K/uL Final  . Lymphocytes Relative 07/06/2015 19  12 - 46 % Final  . Lymphs Abs 07/06/2015 1.3  0.7 - 4.0 K/uL Final  . Monocytes Relative 07/06/2015 15* 3 - 12 % Final  . Monocytes Absolute 07/06/2015 1.0  0.1 - 1.0 K/uL Final  . Eosinophils Relative 07/06/2015 3  0 - 5 % Final  . Eosinophils Absolute 07/06/2015 0.2  0.0 - 0.7 K/uL Final  . Basophils Relative 07/06/2015 1  0 - 1 % Final  . Basophils Absolute 07/06/2015 0.1  0.0 - 0.1 K/uL Final  . Smear Review 07/06/2015 SEE NOTE   Final   Comment:   RBC, Platelet and WBC Morphology unremarkable   . Hgb A1c MFr Bld 07/06/2015 8.6* <5.7 % Final   Comment:                                                                        According to the ADA Clinical Practice Recommendations for 2011, when HbA1c is used as a screening test:     >=6.5%   Diagnostic of Diabetes Mellitus            (if abnormal result is confirmed)   5.7-6.4%    Increased risk of developing Diabetes Mellitus   References:Diagnosis and Classification of Diabetes Mellitus,Diabetes HKVQ,2595,63(OVFIE 1):S62-S69 and Standards of Medical Care in         Diabetes - 2011,Diabetes PPIR,5188,41 (Suppl 1):S11-S61.     . Mean Plasma Glucose 07/06/2015 200* <117 mg/dL Final   Comment:   Footnotes:  (1) ** Please note change in unit of measure and reference range(s). **      The most significant finding is a his hemoglobin A1c of 8.6. He has severe end-stage pulmonary fibrosis. For that reason I have allowed the patient a slightly higher goal hemoglobin A1c between 7 and 8. However he admits that his sugars have been much higher recently. He is not checking his sugars in the morning. However his two-hour postprandial sugars are typically greater than 200 and sometimes even as high as 400. He is currently using 60 units of Lantus per day, 10 units of Humalog with breakfast, and 20 units of Humalog with lunch and with supper. His postprandial sugars are always greater than 200. Past Medical History  Diagnosis Date  . Hypertension   . Hyperlipidemia   .  Postinflammatory pulmonary fibrosis   . CAD (coronary artery disease)   . Pneumothorax on right 4/11  . COPD (chronic obstructive pulmonary disease)   . Atrial tachycardia   . Acute diastolic heart failure   . On home oxygen therapy     "3L; 24/7" (04/15/2015)  . Sleep apnea     "won't use CPAP" (04/14/2105)  . Hypothyroidism   . Type II diabetes mellitus    Past Surgical History  Procedure Laterality Date  . Coronary angioplasty with stent placement  03/05/2003; 01/13/2010    PCI & Stent  LAD & mid CX; stent to CX  (I've got 4 stents total" (04/15/2015)  . Shoulder arthroscopy w/ rotator cuff repair Bilateral   . Bilateral vats ablation Right   . Cardiac catheterization  05/03/2007    patent stents  . Knee surgery  2012    "reattached quad"  . Knee arthroscopy Right 01/22/2013    Procedure: RIGHT  ARTHROSCOPY KNEE WITH DEBRIDEMENT, abrasion chondroplasty of lateral tibial plateau, debridement of partial tear of ACL, menisectomy;  Surgeon: Tobi Bastos, MD;  Location: [facility];  Service: Orthopedics;  Laterality: Right;  . Electrophysiologic study N/A 04/14/2015    Procedure: SVT Ablation;  Surgeon: Evans Lance, MD;  Location: [facility];  Service: Cardiovascular;  Laterality: N/A;  . Cardiac catheterization N/A 05/18/2015    Procedure: Right Heart Cath;  Surgeon: Belva Crome, MD;  Location: [facility];  Service: Cardiovascular;  Laterality: N/A;   Current Outpatient Prescriptions on File Prior to Visit  Medication Sig Dispense Refill  . ADVAIR HFA 115-21 MCG/ACT inhaler Inhale 2 puffs into the lungs 2 (two) times daily.    Marland Kitchen albuterol (PROVENTIL HFA;VENTOLIN HFA) 108 (90 BASE) MCG/ACT inhaler Inhale 2 puffs into the lungs every 6 (six) hours as needed for wheezing. 1 Inhaler 0  . ALPRAZolam (XANAX) 0.5 MG tablet Take 1 tablet (0.5 mg total) by mouth 3 (three) times daily as needed for anxiety. 90 tablet 0  . aspirin 81 MG tablet Take 81 mg by mouth daily.    Marland Kitchen atorvastatin (LIPITOR) 40 MG tablet Take 1 tablet by mouth  every night at bedtime 90 tablet 1  . B-D ULTRAFINE III SHORT PEN 31G X 8 MM MISC Use as directed 240 each 3  . clopidogrel (PLAVIX) 75 MG tablet Take 1 tablet by mouth  every day 90 tablet 3  . diltiazem (CARDIZEM) 30 MG tablet Take 1 tablet (30 mg total) by mouth every 6 (six) hours. 360 tablet 3  . furosemide (LASIX) 20 MG tablet Take 1 tablet (20 mg total) by mouth daily as needed for fluid (fluid). 90 tablet 3  . glucose blood (ACCU-CHEK AVIVA PLUS) test strip Check bs 4-5 times per day DX-250.00 (Patient taking differently: 1 each by Other route See admin instructions. Check blood sugar 2 times daily) 100 each 11  . Insulin Glargine (LANTUS SOLOSTAR) 100 UNIT/ML Solostar Pen Inject 60 Units into the skin at bedtime.    . insulin lispro (HUMALOG) 100  UNIT/ML KiwkPen Inject 10units into the skin with breakfast and lunch, and inject 20units into the skin with dinner. 30 mL 11  . isosorbide mononitrate (IMDUR) 60 MG 24 hr tablet Take 1 and 1/2 tablets by  mouth every day (Patient taking differently: Take 60 mg by  mouth every day) 135 tablet 1  . levothyroxine (SYNTHROID, LEVOTHROID) 125 MCG tablet Take 1 tablet (125 mcg total) by mouth daily. 90 tablet  3  . linagliptin (TRADJENTA) 5 MG TABS tablet Take 1 tablet (5 mg total) by mouth daily. 90 tablet 3  . losartan (COZAAR) 100 MG tablet Take 1 tablet (100 mg total) by mouth daily. 90 tablet 3  . Menthol, Topical Analgesic, (BIOFREEZE EX) Apply 1 application topically as needed (for pain).    . metFORMIN (GLUCOPHAGE) 850 MG tablet Take 1 tablet by mouth  twice a day 180 tablet 0  . metoprolol (TOPROL XL) 200 MG 24 hr tablet Take 1 tablet (200 mg total) by mouth daily. 90 tablet 3  . nitroGLYCERIN (NITROSTAT) 0.4 MG SL tablet Place 1 tablet (0.4 mg total) under the tongue every 5 (five) minutes as needed for chest pain. 25 tablet 1  . SYRINGE/NEEDLE, DISP, 1 ML (B-D SYRINGE/NEEDLE 1CC/25GX5/8) 25G X 5/8" 1 ML MISC As directed 100 each 5  . tiotropium (SPIRIVA) 18 MCG inhalation capsule Place 18 mcg into inhaler and inhale daily.     Marland Kitchen zolpidem (AMBIEN) 10 MG tablet Take 1 tablet (10 mg total) by mouth at bedtime as needed for sleep. 90 tablet 1   No current facility-administered medications on file prior to visit.   Allergies  Allergen Reactions  . Niaspan [Niacin] Itching  . Altace [Ramipril] Other (See Comments)    Cough    Social History   Social History  . Marital Status: Divorced    Spouse Name: N/A  . Number of Children: 1  . Years of Education: N/A   Occupational History  .  [employer]   Social History Main Topics  . Smoking status: Former Smoker -- 1.50 packs/day for 35 years    Types: Cigarettes    Quit date: 10/24/2007  . Smokeless tobacco: Never Used  . Alcohol  Use: 0.0 oz/week    0 Standard drinks or equivalent per week     Comment: 04/15/2015 "I've had a couple drinks in the last 8 months"  . Drug Use: No  . Sexual Activity: Not Currently   Other Topics Concern  . Not on file   Social History Narrative      Review of Systems  All other systems reviewed and are negative.      Objective:   Physical Exam  Constitutional: He appears well-developed and well-nourished.  Cardiovascular: Normal rate, regular rhythm and normal heart sounds.   Pulmonary/Chest: No accessory muscle usage. No respiratory distress. He has decreased breath sounds.  Abdominal: Soft. Bowel sounds are normal.  Musculoskeletal: He exhibits no edema.  Vitals reviewed.         Assessment & Plan:  Type II diabetes mellitus, uncontrolled  Increase Humalog to 30 units at lunch and 30 units at supper. Check fasting blood sugars and two-hour postprandial sugars for the next week. Call me with the values in one week. I would increase Lantus until his fasting blood sugars are less than 130. I would increase Humalog until his two-hour postprandial sugars are less than 160. His cholesterol and thyroid test showed except will values. Received his flu shot today as well as Pneumovax 23

## 2015-07-12 ENCOUNTER — Encounter: Payer: Medicare Other | Admitting: Thoracic Surgery (Cardiothoracic Vascular Surgery)

## 2015-07-12 ENCOUNTER — Encounter: Payer: Self-pay | Admitting: Thoracic Surgery (Cardiothoracic Vascular Surgery)

## 2015-07-12 ENCOUNTER — Institutional Professional Consult (permissible substitution) (INDEPENDENT_AMBULATORY_CARE_PROVIDER_SITE_OTHER): Payer: Medicare Other | Admitting: Thoracic Surgery (Cardiothoracic Vascular Surgery)

## 2015-07-12 ENCOUNTER — Other Ambulatory Visit: Payer: Self-pay | Admitting: *Deleted

## 2015-07-12 VITALS — BP 130/86 | HR 62 | Resp 20 | Ht 73.0 in | Wt 265.0 lb

## 2015-07-12 DIAGNOSIS — J849 Interstitial pulmonary disease, unspecified: Secondary | ICD-10-CM

## 2015-07-12 NOTE — Patient Instructions (Signed)
Stop taking clopidogrel (plavix) after dose on Tuesday

## 2015-07-12 NOTE — Progress Notes (Signed)
PCP is PICKARD,[name], MD Referring Provider is Ramaswamy, Murali, MD  Chief Complaint  Patient presents with  . Interstitial Lung Disease    evaluation of surgical lung biopsy,. Chest CT 03/23/2015     HPI: Jerry Montgomery is sent for consultation regarding a lung biopsy.  He is a 59-year-old man with past history of tobacco abuse who presents with chief complaint of shortness of breath with exertion. His past medical history is significant for right spontaneous pneumothorax, status post right video-assisted thoracoscopy, pulmonary fibrosis, coronary artery disease, Wolff-Parkinson-White with atrial tachycardia, status post ablation, sleep apnea, type 2 diabetes, hypertension, hyperlipidemia, pneumonia, acute diastolic heart failure, COPD, and chronic kidney disease.  He had a recurrent right spontaneous pneumothorax back in 2011. I placed a chest tube in him at that time. Dr. Burney did a right VATS and blebectomy and pleurectomy during that admission.  Over the past several years he's been having issues with shortness of breath and fatigue. He was diagnosed with pulmonary fibrosis. Recently he has had progression on his chest x-ray and has evidence of interstitial lung disease. This is of indeterminate etiology by CT scan. He now needs a lung biopsy to define more clearly what the underlying process is, so that appropriate treatment can be instituted.  He has limited his physical activity. He does wear home oxygen at 3 L nasal cannula. He complains of fatigue. He's not had any significant weight loss. He has sleep apnea but does not use CPAP. He has a history of coronary disease but has not had any anginal pain recently. He also had an ablation for Wolff-Parkinson-White syndrome. A right heart catheterization at the time of his ablation showed normal pulmonary artery pressures and preserve cardiac index. He has held his Plavix for surgical procedures without issue previously.  Zubrod Score: At  the time of surgery this patient's most appropriate activity status/level should be described as: []    0    Normal activity, no symptoms []    1    Restricted in physical strenuous activity but ambulatory, able to do out light work [x]    2    Ambulatory and capable of self care, unable to do work activities, up and about >50 % of waking hours                              []    3    Only limited self care, in bed greater than 50% of waking hours []    4    Completely disabled, no self care, confined to bed or chair []    5    Moribund    Past Medical History  Diagnosis Date  . Hypertension   . Hyperlipidemia   . Postinflammatory pulmonary fibrosis   . CAD (coronary artery disease)   . Pneumothorax on right 4/11  . COPD (chronic obstructive pulmonary disease)   . Atrial tachycardia   . Acute diastolic heart failure   . On home oxygen therapy     "3L; 24/7" (04/15/2015)  . Sleep apnea     "won't use CPAP" (04/14/2105)  . Hypothyroidism   . Type II diabetes mellitus     Past Surgical History  Procedure Laterality Date  . Coronary angioplasty with stent placement  03/05/2003; 01/13/2010    PCI & Stent  LAD & mid CX; stent to CX  (I've got 4 stents total" (04/15/2015)  . Shoulder arthroscopy   w/ rotator cuff repair Bilateral   . Bilateral vats ablation Right   . Cardiac catheterization  05/03/2007    patent stents  . Knee surgery  2012    "reattached quad"  . Knee arthroscopy Right 01/22/2013    Procedure: RIGHT ARTHROSCOPY KNEE WITH DEBRIDEMENT, abrasion chondroplasty of lateral tibial plateau, debridement of partial tear of ACL, menisectomy;  Surgeon: Ronald A Gioffre, MD;  Location: [facility];  Service: Orthopedics;  Laterality: Right;  . Electrophysiologic study N/A 04/14/2015    Procedure: SVT Ablation;  Surgeon: Gregg W Taylor, MD;  Location: [facility];  Service: Cardiovascular;  Laterality: N/A;  . Cardiac catheterization N/A 05/18/2015    Procedure: Right Heart Cath;   Surgeon: Henry W Smith, MD;  Location: [facility];  Service: Cardiovascular;  Laterality: N/A;    Family History  Problem Relation Age of Onset  . Breast cancer Mother   . Lung cancer Father   . Diabetes type II Mother     Social History Social History  Substance Use Topics  . Smoking status: Former Smoker -- 1.50 packs/day for 35 years    Types: Cigarettes    Quit date: 10/24/2007  . Smokeless tobacco: Never Used  . Alcohol Use: 0.0 oz/week    0 Standard drinks or equivalent per week     Comment: 04/15/2015 "I've had a couple drinks in the last 8 months"    Current Outpatient Prescriptions  Medication Sig Dispense Refill  . ACCU-CHEK SOFTCLIX LANCETS lancets Check blood sugar twice daily 100 each 1  . ADVAIR HFA 115-21 MCG/ACT inhaler Inhale 2 puffs into the lungs 2 (two) times daily.    . albuterol (PROVENTIL HFA;VENTOLIN HFA) 108 (90 BASE) MCG/ACT inhaler Inhale 2 puffs into the lungs every 6 (six) hours as needed for wheezing. 1 Inhaler 0  . ALPRAZolam (XANAX) 0.5 MG tablet Take 1 tablet (0.5 mg total) by mouth 3 (three) times daily as needed for anxiety. 90 tablet 0  . aspirin 81 MG tablet Take 81 mg by mouth daily.    . atorvastatin (LIPITOR) 40 MG tablet Take 1 tablet by mouth  every night at bedtime 90 tablet 1  . B-D ULTRAFINE III SHORT PEN 31G X 8 MM MISC Use as directed 240 each 3  . clopidogrel (PLAVIX) 75 MG tablet Take 1 tablet by mouth  every day 90 tablet 3  . diltiazem (CARDIZEM) 30 MG tablet Take 1 tablet (30 mg total) by mouth every 6 (six) hours. 360 tablet 3  . furosemide (LASIX) 20 MG tablet Take 1 tablet (20 mg total) by mouth daily as needed for fluid (fluid). 90 tablet 3  . glucose blood (ACCU-CHEK AVIVA PLUS) test strip Check bs 4-5 times per day DX-250.00 (Patient taking differently: 1 each by Other route See admin instructions. Check blood sugar 2 times daily) 100 each 11  . Insulin Glargine (LANTUS SOLOSTAR) 100 UNIT/ML Solostar Pen Inject 60  Units into the skin at bedtime.    . insulin lispro (HUMALOG) 100 UNIT/ML KiwkPen Inject 10units into the skin with breakfast and lunch, and inject 20units into the skin with dinner. 30 mL 11  . isosorbide mononitrate (IMDUR) 60 MG 24 hr tablet Take 1 and 1/2 tablets by  mouth every day (Patient taking differently: Take 60 mg by  mouth every day) 135 tablet 1  . levothyroxine (SYNTHROID, LEVOTHROID) 125 MCG tablet Take 1 tablet (125 mcg total) by mouth daily. 90 tablet 3  . linagliptin (TRADJENTA)   5 MG TABS tablet Take 1 tablet (5 mg total) by mouth daily. 90 tablet 3  . losartan (COZAAR) 100 MG tablet Take 1 tablet (100 mg total) by mouth daily. 90 tablet 3  . Menthol, Topical Analgesic, (BIOFREEZE EX) Apply 1 application topically as needed (for pain).    . metFORMIN (GLUCOPHAGE) 850 MG tablet Take 1 tablet by mouth  twice a day 180 tablet 0  . metoprolol (TOPROL XL) 200 MG 24 hr tablet Take 1 tablet (200 mg total) by mouth daily. 90 tablet 3  . nitroGLYCERIN (NITROSTAT) 0.4 MG SL tablet Place 1 tablet (0.4 mg total) under the tongue every 5 (five) minutes as needed for chest pain. 25 tablet 1  . SYRINGE/NEEDLE, DISP, 1 ML (B-D SYRINGE/NEEDLE 1CC/25GX5/8) 25G X 5/8" 1 ML MISC As directed 100 each 5  . tiotropium (SPIRIVA) 18 MCG inhalation capsule Place 18 mcg into inhaler and inhale daily.     . zolpidem (AMBIEN) 10 MG tablet Take 1 tablet (10 mg total) by mouth at bedtime as needed for sleep. 90 tablet 1   No current facility-administered medications for this visit.    Allergies  Allergen Reactions  . Niaspan [Niacin] Itching  . Altace [Ramipril] Other (See Comments)    Cough     Review of Systems  Constitutional: Positive for activity change (limited by SOB and right knee giving out) and fatigue. Negative for fever, chills, appetite change and unexpected weight change.  HENT: Positive for hearing loss. Negative for congestion.   Respiratory: Positive for apnea and shortness of  breath.        Home O2. Right CW pain sporadic since surgery.  Cardiovascular: Negative for chest pain and leg swelling.  Gastrointestinal: Negative.   Genitourinary: Negative.   Musculoskeletal: Positive for arthralgias and gait problem (right knee "gives out").  Neurological: Positive for numbness. Negative for syncope and weakness.  Hematological: Negative for adenopathy. Bruises/bleeds easily.  All other systems reviewed and are negative.   BP 130/86 mmHg  Pulse 62  Resp 20  Ht 6' 1" (1.854 m)  Wt 265 lb (120.203 kg)  BMI 34.97 kg/m2  SpO2 98% Physical Exam  Constitutional: He is oriented to person, place, and time. He appears well-developed and well-nourished. No distress.  HENT:  Head: Normocephalic and atraumatic.  Mouth/Throat: No oropharyngeal exudate.  Eyes: Conjunctivae and EOM are normal. No scleral icterus.  Neck: Neck supple. No tracheal deviation present. No thyromegaly present.  Cardiovascular: Normal rate, regular rhythm, normal heart sounds and intact distal pulses.   No murmur heard. Pulmonary/Chest: No stridor.  Abdominal: Soft. There is no tenderness.  Musculoskeletal: Normal range of motion. He exhibits no edema.  Lymphadenopathy:    He has no cervical adenopathy.  Neurological: He is alert and oriented to person, place, and time. No cranial nerve deficit. He exhibits normal muscle tone.  Skin: Skin is warm and dry. No rash noted.  Psychiatric: He has a normal mood and affect.  Vitals reviewed.    Diagnostic Tests: CT CHEST WITHOUT CONTRAST  TECHNIQUE: Multidetector CT imaging of the chest was performed following the standard protocol without intravenous contrast. High resolution imaging of the lungs, as well as inspiratory and expiratory imaging, was performed.  COMPARISON: Chest CT 06/11/2013.  FINDINGS: Mediastinum/Lymph Nodes: Heart size is borderline enlarged. There is no significant pericardial fluid, thickening or  pericardial calcification. There is atherosclerosis of the thoracic aorta, the great vessels of the mediastinum and the coronary arteries, including calcified atherosclerotic plaque in   the left main, left anterior descending, left circumflex and right coronary arteries. Dilatation of the pulmonic trunk (4 cm in diameter). Multiple borderline enlarged and mildly enlarged mediastinal and bilateral hilar lymph nodes, largest of which measures up to 19 mm in short axis in the low right paratracheal nodal station. The majority of these lymph nodes are densely calcified. Esophagus is unremarkable in appearance. No axillary lymphadenopathy.  Lungs/Pleura: High-resolution images demonstrate worsening areas of predominantly peribronchovascular ground-glass attenuation scattered in a random distribution in the lungs bilaterally, most evident in the lateral segment of the right middle lobe, medial aspect of the basal segments of the right lower lobe, and in the posterior aspect of the left upper lobe. In the areas of greatest involvement there is some associated septal thickening, marked thickening of the peribronchovascular interstitium and focal architectural distortion. Minimal cylindrical bronchiectasis is identified, as well as some peripheral bronchiolectasis noted in the areas of greatest involvement. There is also a background of diffuse bronchial wall thickening with mild centrilobular and paraseptal emphysema. Chronic right-sided pleural thickening posteriorly is again noted. No pleural effusions. No definite suspicious appearing pulmonary nodules or masses are noted. Chronic bilateral apical pleuroparenchymal thickening, somewhat nodular in appearance, unchanged compared to the prior examination, most compatible with chronic post infectious/inflammatory scarring.  Upper Abdomen: Unremarkable.  Musculoskeletal/Soft Tissues: There are no aggressive appearing lytic or blastic  lesions noted in the visualized portions of the skeleton.  IMPRESSION: 1. Unusual appearance of the lung parenchyma, compatible with a progressively worsening interstitial lung disease. Given the predominant peribronchovascular pattern and the presence of calcified mediastinal and bilateral hilar lymphadenopathy, the possibility of interstitial lung changes related to underlying sarcoidosis should be considered. The asymmetric distribution of disease and lack of honeycombing suggests against usual interstitial pneumonia (UIP). Progressive disease would be unusual for an entity such as nonspecific interstitial pneumonia (NSIP). Overall, the imaging findings are nonspecific. Repeat high-resolution chest CT in 12 months would be useful to assess for further temporal changes in the appearance of the lung parenchyma if clinically appropriate. 2. Mild diffuse bronchial wall thickening with mild centrilobular and paraseptal emphysema. 3. Dilatation of the pulmonic trunk (4.0 cm in diameter), suggestive of pulmonary arterial hypertension. 4. Atherosclerosis, including left main and 3 vessel coronary artery disease. Please note that although the presence of coronary artery calcium documents the presence of coronary artery disease, the severity of this disease and any potential stenosis cannot be assessed on this non-gated CT examination. Assessment for potential risk factor modification, dietary therapy or pharmacologic therapy may be warranted, if clinically indicated.   Electronically Signed  By: Daniel Entrikin M.D.  On: 03/23/2015 17:14  Pulmonary function testing  FVC equals 3.48 (64%) FEV1 equals 2.52 (61%), 2.63 postbronchodilator DLCO 39%  I personally reviewed the CT chest, right heart catheterization, and we'll make function testing and concur with the findings as indicated above.  Impression: 59-year-old man with multiple medical problems including interstitial lung  disease/pulmonary fibrosis. He is dyspneic and requires home oxygen. CT findings are not characteristic for any specific interstitial lung disease and he needs a biopsy to determine the underlying process and guide therapy. He understands that this would be a diagnostic, not a therapeutic, procedure.  I discussed the ongoing study with bronchoscopy, transbronchial biopsy, and bronchoalveolar lavage with him. This would be done at the time of his lung biopsy. He is interested in today stating in the study and we'll look forward to hearing from Dr. Ramaswamy's team regarding that.  I described   the proposed operation to him. This will be bronchoscopy, left VATS, lung biopsy. He is familiar with the general nature of the video-assisted thoracoscopic surgery given his previous surgery on the opposite side. We discussed the expected hospital stay and overall recovery. I reviewed the indications, risks, benefits, and alternatives. He understands the risk include, but are not limited to death, MI, DVT, PE, bleeding, possible need for transfusion, infection, prolonged air leak, cardiac arrhythmias, as well as the possibility of other unforeseeable complications. He accepts those risks and wishes to proceed.  He says he has held his Plavix prior to invasive procedures on multiple occasions without any issues related to his stents. He will need to hold for 5 days prior to surgery  Plan: Bronchoscopy, left VATS, lung biopsy on Monday, 07/19/2015.  He will hold Plavix after his dose on Tuesday, 07/13/2015  Steven C Hendrickson, MD [facility] [phone]  

## 2015-07-15 ENCOUNTER — Other Ambulatory Visit: Payer: Self-pay | Admitting: *Deleted

## 2015-07-15 ENCOUNTER — Encounter (HOSPITAL_COMMUNITY): Payer: Self-pay

## 2015-07-15 ENCOUNTER — Encounter (HOSPITAL_COMMUNITY)
Admission: RE | Admit: 2015-07-15 | Discharge: 2015-07-15 | Disposition: A | Discharge status: Home / Self Care | Payer: Medicare Other | Source: Ambulatory Visit | Attending: Thoracic Surgery (Cardiothoracic Vascular Surgery) | Admitting: Thoracic Surgery (Cardiothoracic Vascular Surgery)

## 2015-07-15 ENCOUNTER — Encounter: Payer: Self-pay | Admitting: Thoracic Surgery (Cardiothoracic Vascular Surgery)

## 2015-07-15 VITALS — BP 112/55 | HR 58 | Temp 97.7°F | Resp 20 | Ht 73.0 in | Wt 262.6 lb

## 2015-07-15 DIAGNOSIS — I10 Essential (primary) hypertension: Secondary | ICD-10-CM | POA: Insufficient documentation

## 2015-07-15 DIAGNOSIS — Z87891 Personal history of nicotine dependence: Secondary | ICD-10-CM | POA: Diagnosis not present

## 2015-07-15 DIAGNOSIS — E119 Type 2 diabetes mellitus without complications: Secondary | ICD-10-CM | POA: Diagnosis not present

## 2015-07-15 DIAGNOSIS — Z9981 Dependence on supplemental oxygen: Secondary | ICD-10-CM | POA: Diagnosis not present

## 2015-07-15 DIAGNOSIS — E785 Hyperlipidemia, unspecified: Secondary | ICD-10-CM | POA: Diagnosis not present

## 2015-07-15 DIAGNOSIS — I5030 Unspecified diastolic (congestive) heart failure: Secondary | ICD-10-CM | POA: Diagnosis not present

## 2015-07-15 DIAGNOSIS — Z7982 Long term (current) use of aspirin: Secondary | ICD-10-CM | POA: Diagnosis not present

## 2015-07-15 DIAGNOSIS — N390 Urinary tract infection, site not specified: Secondary | ICD-10-CM

## 2015-07-15 DIAGNOSIS — Z01812 Encounter for preprocedural laboratory examination: Secondary | ICD-10-CM | POA: Diagnosis not present

## 2015-07-15 DIAGNOSIS — J849 Interstitial pulmonary disease, unspecified: Secondary | ICD-10-CM | POA: Insufficient documentation

## 2015-07-15 DIAGNOSIS — Z79899 Other long term (current) drug therapy: Secondary | ICD-10-CM | POA: Diagnosis not present

## 2015-07-15 DIAGNOSIS — Z794 Long term (current) use of insulin: Secondary | ICD-10-CM | POA: Diagnosis not present

## 2015-07-15 DIAGNOSIS — Z01818 Encounter for other preprocedural examination: Secondary | ICD-10-CM | POA: Diagnosis present

## 2015-07-15 DIAGNOSIS — E039 Hypothyroidism, unspecified: Secondary | ICD-10-CM | POA: Insufficient documentation

## 2015-07-15 DIAGNOSIS — Z0183 Encounter for blood typing: Secondary | ICD-10-CM | POA: Insufficient documentation

## 2015-07-15 DIAGNOSIS — Z955 Presence of coronary angioplasty implant and graft: Secondary | ICD-10-CM | POA: Insufficient documentation

## 2015-07-15 DIAGNOSIS — I2510 Atherosclerotic heart disease of native coronary artery without angina pectoris: Secondary | ICD-10-CM | POA: Insufficient documentation

## 2015-07-15 DIAGNOSIS — J449 Chronic obstructive pulmonary disease, unspecified: Secondary | ICD-10-CM | POA: Insufficient documentation

## 2015-07-15 HISTORY — DX: Unspecified atrial fibrillation: I48.91

## 2015-07-15 HISTORY — DX: Supraventricular tachycardia: I47.1

## 2015-07-15 HISTORY — DX: Supraventricular tachycardia, unspecified: I47.10

## 2015-07-15 HISTORY — DX: Pre-excitation syndrome: I45.6

## 2015-07-15 LAB — URINALYSIS, ROUTINE W REFLEX MICROSCOPIC
Bilirubin Urine: NEGATIVE
Glucose, UA: 250 mg/dL — AB
Hgb urine dipstick: NEGATIVE
KETONES UR: NEGATIVE mg/dL
NITRITE: POSITIVE — AB
PROTEIN: NEGATIVE mg/dL
Specific Gravity, Urine: 1.024 (ref 1.005–1.030)
UROBILINOGEN UA: 0.2 mg/dL (ref 0.0–1.0)
pH: 5.5 (ref 5.0–8.0)

## 2015-07-15 LAB — COMPREHENSIVE METABOLIC PANEL
ALK PHOS: 58 U/L (ref 38–126)
ALT: 28 U/L (ref 17–63)
AST: 30 U/L (ref 15–41)
Albumin: 3.7 g/dL (ref 3.5–5.0)
Anion gap: 11 (ref 5–15)
BUN: 23 mg/dL — AB (ref 6–20)
CALCIUM: 9.6 mg/dL (ref 8.9–10.3)
CHLORIDE: 107 mmol/L (ref 101–111)
CO2: 23 mmol/L (ref 22–32)
CREATININE: 1.41 mg/dL — AB (ref 0.61–1.24)
GFR calc non Af Amer: 52 mL/min — ABNORMAL LOW (ref >60)
GLUCOSE: 123 mg/dL — AB (ref 65–99)
Potassium: 4.5 mmol/L (ref 3.5–5.1)
SODIUM: 141 mmol/L (ref 135–145)
Total Bilirubin: 0.6 mg/dL (ref 0.3–1.2)
Total Protein: 7.2 g/dL (ref 6.5–8.1)

## 2015-07-15 LAB — BLOOD GAS, ARTERIAL
ACID-BASE DEFICIT: 0.4 mmol/L (ref 0.0–2.0)
Bicarbonate: 23.6 mEq/L (ref 20.0–24.0)
DRAWN BY: 206361
O2 Content: 3 L/min
O2 Saturation: 97.2 %
PATIENT TEMPERATURE: 98.6
PCO2 ART: 38 mmHg (ref 35.0–45.0)
PH ART: 7.41 (ref 7.350–7.450)
TCO2: 24.8 mmol/L (ref 0–100)
pO2, Arterial: 94.3 mmHg (ref 80.0–100.0)

## 2015-07-15 LAB — CBC
HCT: 40.9 % (ref 39.0–52.0)
HEMOGLOBIN: 13.9 g/dL (ref 13.0–17.0)
MCH: 30.1 pg (ref 26.0–34.0)
MCHC: 34 g/dL (ref 30.0–36.0)
MCV: 88.5 fL (ref 78.0–100.0)
Platelets: 184 10*3/uL (ref 150–400)
RBC: 4.62 MIL/uL (ref 4.22–5.81)
RDW: 12.8 % (ref 11.5–15.5)
WBC: 6.9 10*3/uL (ref 4.0–10.5)

## 2015-07-15 LAB — URINE MICROSCOPIC-ADD ON

## 2015-07-15 LAB — SURGICAL PCR SCREEN
MRSA, PCR: NEGATIVE
Staphylococcus aureus: NEGATIVE

## 2015-07-15 LAB — APTT: aPTT: 31 seconds (ref 24–37)

## 2015-07-15 LAB — TYPE AND SCREEN
ABO/RH(D): O POS
Antibody Screen: NEGATIVE

## 2015-07-15 LAB — PROTIME-INR
INR: 1.02 (ref 0.00–1.49)
Prothrombin Time: 13.6 seconds (ref 11.6–15.2)

## 2015-07-15 LAB — GLUCOSE, CAPILLARY: Glucose-Capillary: 105 mg/dL — ABNORMAL HIGH (ref 65–99)

## 2015-07-15 MED ORDER — CIPROFLOXACIN HCL 500 MG PO TABS: 500.0000 mg | ORAL_TABLET | Freq: Two times a day (BID) | ORAL | Status: DC

## 2015-07-15 NOTE — Progress Notes (Signed)
Anesthesia Note: Patient is a 59 year old male scheduled for video bronchoscopy, left VATS, lung biopsy on 07/19/15 by Dr. Dorris Fetch. DX: ILD.  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 C-anca trace positive but mpo/pr3 negative), right PTX '11 s/p VATS pleurodesis. BMI is consistent with obesity.   PCP is Dr. Lynnea Ferrier, last visit 07/08/15.  Primary cardiologist is Dr. [name], last visit 07/09/14 with more recent visits by Dr. Landry Dyke partners in 01/2015 during his hospitalization for acute diastolic CHF with acute respiratory distress and atrial tachycardia.  EP Cardiologist is Dr. [name], last visit 05/12/15.  Pulmonologist is Dr. [name], last visit 07/03/15. Dr. [name] notified Dr. [name] and Dr. [name] back in June of patient's need for lung biopsy and asked that they could arrange a RHC, which was done in July.   Meds include Advair, albuterol, Xanax, ASA 81 mg, Lipitor, Plavix (last dose 07/13/15 pre-op), Cardizem, Lasix, Lantus, Humalog, Imdur, levothyroxine, Tradjenta, losartan, metformin, Toprol XL, Nitro, Spiriva, Ambien.   05/18/15 RHC: Normal pulmonary artery pressures. Right Heart Pressures Pulmonary artery pressures were noted to be normal. Pulmonary artery saturation 69%, peripheral aortic saturation 95%  06/10/14 Echo: Study Conclusions - Left ventricle: The cavity size was normal. Wall thickness was normal. Systolic function was normal. The estimated ejection fraction was in the range of 55% to 60%. Hypokinesis of the basalinferolateral and inferior myocardium. Features are  consistent with a pseudonormal left ventricular filling pattern, with concomitant abnormal relaxation and increased filling pressure (grade 2 diastolic dysfunction). - Left atrium: The atrium was moderately dilated. - Pulmonary arteries: Systolic pressure was moderately increased. PA peak pressure: 48 mm Hg (S). Impressions: - Compared to 2011, there are new wall motion abnormalities in the left circumflex coronary artery territory and signs of worsening diastolic function with elevated mean LA pressure and moderate pulmonary hypertension.  11/29/12 Nuclear stress test: Overall Impression: Normal stress nuclear study. Low risk stress nuclear study. LV Wall Motion: Not gated secondary to ectopy.  01/13/10 LHC:  1. Mild LV dysfunction with mid to basilar inferior hypocontractility, inferoapical hypocontractililty, and low posterolateral hypocontractility. 2. Multivessel CAD with 40% proximal LAD, widely patent proximal LAD stent with 40% mid LAD narrowing, new 60-95% stenoses in the CX vessel occurring predominately between the proximal to mid placed stent but with some narrowing in the distal aspect of the proximal stent with 30% narrowing in the CX beyond the distal stent and 30% narrowing in the posterolateral branch. 3. Old, diffuse 80+% stenoses in 2 branches of a small non-dominant RCA. 4. Normal aortoiliac system. 5. Successful PCI of the CX with PTCA, 3.0 X 28 mm Promus DES.  07/15/15 EKG: SB at 59 bpm, occasional PVC, anterior infarct (age undetermined), inferior infarct (age undetermined). Anterolateral T wave abnormality has improved when compared to 05/12/15 tracing from [facility]. Changes are now more non-specific. He denied any chest pain or new SOB at his PAT visit.   04/2014 48 hour Holter monitor: Predominant sinus rhythm. Frequent PVCs, multiform, several ventricular couplets.  03/09/15 Pulmonary function testing FVC equals 3.48 (64%) FEV1 equals 2.52 (61%), 2.63  postbronchodilator DLCO 39%  According to 07/03/15 pulmonology notes:  Walk test 185 feet 3 laps on room air - We left him on room air for 20 minutes and then walked him 185 feet 3 laps. He desaturated only on the second lap to below 88%.  Preoperative labs noted. Cr 1.41. Glucose 123. CBC WNL. PT/PTT WNL. UA shows moderate leukocytes and positive nitrites, WBC too numerous to count. (UA report routed to Dr. Dorris Fetch who plans to start him on antibiotics.)  A1C 8.6 on 07/06/15.   He is for CXR on the day of surgery.  He has had recent cardiology, pulmonology and PCP evaluations. If no acute changes then I would anticipate that he could proceed as planned.  Velna Ochs [facility] Short Stay Center/Anesthesiology Phone [phone] 07/15/2015 12:37 PM

## 2015-07-15 NOTE — Progress Notes (Addendum)
Left message with Rowland Lathe, LPN regarding UA. During PAT visit patient denied CP/ Shob. PCP Dr. Tanya Nones, Cardiologist Dr. [name], Electrophysiologist Dr. [name]. Cardiac test as noted in Epic

## 2015-07-15 NOTE — Pre-Procedure Instructions (Signed)
Jerry Montgomery  07/15/2015     Your procedure is scheduled on September 26.  Report to [facility] Admitting at 5:30 A.M.  Call this number if you have problems the morning of surgery:  [phone]   Remember:  Do not eat food or drink liquids after midnight.  Take these medicines the morning of surgery with A SIP OF WATER Advair, Albuterol, Xanax, Cardizem, Isosorbide, Levothyroxine, Metoprolol, Spirivia,    Continue to hold Plavix as directed.   STOP/ Do not take Aspirin, Aleve, Naproxen, Advil, Ibuprofen, Motrin, Vitamins, Herbs, or Supplements starting today  How to Manage Your Diabetes Before Surgery   Why is it important to control my blood sugar before and after surgery?   Improving blood sugar levels before and after surgery helps healing and can limit problems.  A way of improving blood sugar control is eating a healthy diet by:  - Eating less sugar and carbohydrates  - Increasing activity/exercise  - Talk with your doctor about reaching your blood sugar goals  High blood sugars (greater than 180 mg/dL) can raise your risk of infections and slow down your recovery so you will need to focus on controlling your diabetes during the weeks before surgery.  Make sure that the doctor who takes care of your diabetes knows about your planned surgery including the date and location.  How do I manage my blood sugars before surgery?   Check your blood sugar at least 4 times a day, 2 days before surgery to make sure that they are not too high or low.   Check your blood sugar the morning of your surgery when you wake up and every 2 hours until you get to the Short-Stay unit.  If your blood sugar is less than 70 mg/dL, you will need to treat for low blood sugar by:  Treat a low blood sugar (less than 70 mg/dL) with 1/2 cup of clear juice (cranberry or apple), 4 glucose tablets, OR glucose gel.  Recheck blood sugar in 15 minutes after treatment (to make sure it  is greater than 70 mg/dL).  If blood sugar is not greater than 70 mg/dL on re-check, call [phone] for further instructions.   Report your blood sugar to the Short-Stay nurse when you get to Short-Stay.  References:  [facility] 2007 "How to Manage your Diabetes Before and After Surgery".  What do I do about my diabetes medications?   Do not take oral diabetes medicines (pills) the morning of surgery.  THE NIGHT BEFORE SURGERY, take 48 units of Lantus Insulin.    THE MORNING OF SURGERY, take Do Not Take Insulin    Do not take other diabetes injectables the day of surgery including Byetta, Victoza, Bydureon, and Trulicity.     Do not wear jewelry, make-up or nail polish.  Do not wear lotions, powders, or perfumes.  You may wear deodorant.  Do not shave 48 hours prior to surgery.  Men may shave face and neck.  Do not bring valuables to the hospital.  [facility] is not responsible for any belongings or valuables.  Contacts, dentures or bridgework may not be worn into surgery.  Leave your suitcase in the car.  After surgery it may be brought to your room.  For patients admitted to the hospital, discharge time will be determined by your treatment team.  Patients discharged the day of surgery will not be allowed to drive home.   [place] -  Preparing for Surgery  Before surgery, you can play an important role.  Because skin is not sterile, your skin needs to be as free of germs as possible.  You can reduce the number of germs on you skin by washing with CHG (chlorahexidine gluconate) soap before surgery.  CHG is an antiseptic cleaner which kills germs and bonds with the skin to continue killing germs even after washing.  Please DO NOT use if you have an allergy to CHG or antibacterial soaps.  If your skin becomes reddened/irritated stop using the CHG and inform your nurse when you arrive at Short Stay.  Do not shave (including legs and underarms)  for at least 48 hours prior to the first CHG shower.  You may shave your face.  Please follow these instructions carefully:   1.  Shower with CHG Soap the night before surgery and the morning of Surgery.  2.  If you choose to wash your hair, wash your hair first as usual with your normal shampoo.  3.  After you shampoo, rinse your hair and body thoroughly to remove the shampoo.  4.  Use CHG as you would any other liquid soap.  You can apply CHG directly to the skin and wash gently with scrungie or a clean washcloth.  5.  Apply the CHG Soap to your body ONLY FROM THE NECK DOWN.  Do not use on open wounds or open sores.  Avoid contact with your eyes, ears, mouth and genitals (private parts).  Wash genitals (private parts) with your normal soap.  6.  Wash thoroughly, paying special attention to the area where your surgery will be performed.  7.  Thoroughly rinse your body with warm water from the neck down.  8.  DO NOT shower/wash with your normal soap after using and rinsing off the CHG Soap.  9.  Pat yourself dry with a clean towel.            10.  Wear clean pajamas.            11.  Place clean sheets on your bed the night of your first shower and do not sleep with pets.  Day of Surgery  Do not apply any lotions the morning of surgery.  Please wear clean clothes to the hospital/surgery center.   Please read over the following fact sheets that you were given. Pain Booklet, Coughing and Deep Breathing, Blood Transfusion Information and Surgical Site Infection Prevention

## 2015-07-18 NOTE — Anesthesia Preprocedure Evaluation (Addendum)
Anesthesia Evaluation  Patient identified by MRN, date of birth, ID band Patient awake    Reviewed: Allergy & Precautions, H&P , Patient's Chart, lab work & pertinent test results, reviewed documented beta blocker date and time   Airway Mallampati: II  TM Distance: >3 FB Neck ROM: full    Dental no notable dental hx. (+) Teeth Intact, Dental Advisory Given   Pulmonary sleep apnea (does not use CPAP) and Oxygen sleep apnea , COPD,  COPD inhaler and oxygen dependent, former smoker,  pulm fibrosis   Pulmonary exam normal breath sounds clear to auscultation       Cardiovascular hypertension, Pt. on medications + CAD and + Cardiac Stents (Cx, LAD)  + dysrhythmias (WPW, s/p ablation) Atrial Fibrillation  Rhythm:regular Rate:Normal  '15 ECHO: EF 55-60%, inferior hypokinesis, mod pulm HTN   Neuro/Psych    GI/Hepatic   Endo/Other  diabetes, Insulin DependentHypothyroidism Morbid obesity  Renal/GU      Musculoskeletal   Abdominal   Peds  Hematology   Anesthesia Other Findings   Reproductive/Obstetrics                        Anesthesia Physical Anesthesia Plan  ASA: III  Anesthesia Plan: General   Post-op Pain Management:    Induction: Intravenous  Airway Management Planned: Oral ETT and Double Lumen EBT  Additional Equipment: Arterial line, CVP and Ultrasound Guidance Line Placement  Intra-op Plan:   Post-operative Plan: Extubation in OR  Informed Consent: I have reviewed the patients History and Physical, chart, labs and discussed the procedure including the risks, benefits and alternatives for the proposed anesthesia with the patient or authorized representative who has indicated his/her understanding and acceptance.   Dental Advisory Given and Dental advisory given  Plan Discussed with: CRNA, Surgeon and Anesthesiologist  Anesthesia Plan Comments: (Plan routine monitors, A line, CVP,  GETA with DLT)      Anesthesia Quick Evaluation

## 2015-07-19 ENCOUNTER — Encounter (HOSPITAL_COMMUNITY): Payer: Self-pay | Admitting: *Deleted

## 2015-07-19 ENCOUNTER — Inpatient Hospital Stay (HOSPITAL_COMMUNITY): Payer: Medicare Other

## 2015-07-19 ENCOUNTER — Inpatient Hospital Stay (HOSPITAL_COMMUNITY): Payer: Medicare Other | Admitting: Certified Registered Nurse Anesthetist

## 2015-07-19 ENCOUNTER — Encounter (HOSPITAL_COMMUNITY)
Admission: RE | Disposition: A | Discharge status: Home / Self Care | Payer: Self-pay | Source: Ambulatory Visit | Attending: Thoracic Surgery (Cardiothoracic Vascular Surgery)

## 2015-07-19 ENCOUNTER — Inpatient Hospital Stay (HOSPITAL_COMMUNITY)
Admission: RE | Admit: 2015-07-19 | Discharge: 2015-07-23 | DRG: 166 | Disposition: A | Discharge status: Home / Self Care | Payer: Medicare Other | Source: Ambulatory Visit | Attending: Thoracic Surgery (Cardiothoracic Vascular Surgery) | Admitting: Thoracic Surgery (Cardiothoracic Vascular Surgery)

## 2015-07-19 ENCOUNTER — Inpatient Hospital Stay (HOSPITAL_COMMUNITY): Payer: Medicare Other | Admitting: Vascular Surgery

## 2015-07-19 DIAGNOSIS — E785 Hyperlipidemia, unspecified: Secondary | ICD-10-CM | POA: Diagnosis present

## 2015-07-19 DIAGNOSIS — Z91048 Other nonmedicinal substance allergy status: Secondary | ICD-10-CM | POA: Diagnosis not present

## 2015-07-19 DIAGNOSIS — Z7982 Long term (current) use of aspirin: Secondary | ICD-10-CM | POA: Diagnosis not present

## 2015-07-19 DIAGNOSIS — J84112 Idiopathic pulmonary fibrosis: Principal | ICD-10-CM | POA: Diagnosis present

## 2015-07-19 DIAGNOSIS — Z79899 Other long term (current) drug therapy: Secondary | ICD-10-CM | POA: Diagnosis not present

## 2015-07-19 DIAGNOSIS — J8489 Other specified interstitial pulmonary diseases: Secondary | ICD-10-CM | POA: Diagnosis present

## 2015-07-19 DIAGNOSIS — J849 Interstitial pulmonary disease, unspecified: Secondary | ICD-10-CM | POA: Diagnosis not present

## 2015-07-19 DIAGNOSIS — E1122 Type 2 diabetes mellitus with diabetic chronic kidney disease: Secondary | ICD-10-CM | POA: Diagnosis present

## 2015-07-19 DIAGNOSIS — Z794 Long term (current) use of insulin: Secondary | ICD-10-CM | POA: Diagnosis not present

## 2015-07-19 DIAGNOSIS — I456 Pre-excitation syndrome: Secondary | ICD-10-CM | POA: Diagnosis present

## 2015-07-19 DIAGNOSIS — Z955 Presence of coronary angioplasty implant and graft: Secondary | ICD-10-CM

## 2015-07-19 DIAGNOSIS — J449 Chronic obstructive pulmonary disease, unspecified: Secondary | ICD-10-CM | POA: Diagnosis present

## 2015-07-19 DIAGNOSIS — I2510 Atherosclerotic heart disease of native coronary artery without angina pectoris: Secondary | ICD-10-CM | POA: Diagnosis present

## 2015-07-19 DIAGNOSIS — Z888 Allergy status to other drugs, medicaments and biological substances status: Secondary | ICD-10-CM

## 2015-07-19 DIAGNOSIS — G4733 Obstructive sleep apnea (adult) (pediatric): Secondary | ICD-10-CM | POA: Diagnosis present

## 2015-07-19 DIAGNOSIS — J90 Pleural effusion, not elsewhere classified: Secondary | ICD-10-CM | POA: Diagnosis present

## 2015-07-19 DIAGNOSIS — J441 Chronic obstructive pulmonary disease with (acute) exacerbation: Secondary | ICD-10-CM | POA: Diagnosis not present

## 2015-07-19 DIAGNOSIS — Z419 Encounter for procedure for purposes other than remedying health state, unspecified: Secondary | ICD-10-CM

## 2015-07-19 DIAGNOSIS — Z9981 Dependence on supplemental oxygen: Secondary | ICD-10-CM

## 2015-07-19 DIAGNOSIS — E039 Hypothyroidism, unspecified: Secondary | ICD-10-CM | POA: Diagnosis present

## 2015-07-19 DIAGNOSIS — J939 Pneumothorax, unspecified: Secondary | ICD-10-CM | POA: Diagnosis not present

## 2015-07-19 DIAGNOSIS — Z87891 Personal history of nicotine dependence: Secondary | ICD-10-CM

## 2015-07-19 DIAGNOSIS — Z7902 Long term (current) use of antithrombotics/antiplatelets: Secondary | ICD-10-CM

## 2015-07-19 DIAGNOSIS — J9621 Acute and chronic respiratory failure with hypoxia: Secondary | ICD-10-CM | POA: Diagnosis not present

## 2015-07-19 DIAGNOSIS — R0902 Hypoxemia: Secondary | ICD-10-CM | POA: Diagnosis not present

## 2015-07-19 DIAGNOSIS — I4891 Unspecified atrial fibrillation: Secondary | ICD-10-CM | POA: Diagnosis present

## 2015-07-19 HISTORY — PX: VIDEO ASSISTED THORACOSCOPY: SHX5073

## 2015-07-19 HISTORY — PX: VIDEO BRONCHOSCOPY: SHX5072

## 2015-07-19 LAB — GLUCOSE, CAPILLARY
GLUCOSE-CAPILLARY: 107 mg/dL — AB (ref 65–99)
GLUCOSE-CAPILLARY: 82 mg/dL (ref 65–99)
Glucose-Capillary: 130 mg/dL — ABNORMAL HIGH (ref 65–99)

## 2015-07-19 SURGERY — BRONCHOSCOPY, VIDEO-ASSISTED
Anesthesia: General | Site: Chest

## 2015-07-19 MED ORDER — VECURONIUM BROMIDE 10 MG IV SOLR
INTRAVENOUS | Status: DC | PRN
  Administered 2015-07-19: 3 mg via INTRAVENOUS
  Administered 2015-07-19: 2 mg via INTRAVENOUS

## 2015-07-19 MED ORDER — SENNOSIDES-DOCUSATE SODIUM 8.6-50 MG PO TABS
1.0000 | ORAL_TABLET | Freq: Every day | ORAL | Status: DC
  Administered 2015-07-19 – 2015-07-22 (×4): 1 via ORAL
  Filled 2015-07-19 (×5): qty 1

## 2015-07-19 MED ORDER — ALBUTEROL SULFATE (2.5 MG/3ML) 0.083% IN NEBU: 3.0000 mL | INHALATION_SOLUTION | Freq: Four times a day (QID) | RESPIRATORY_TRACT | Status: DC | PRN

## 2015-07-19 MED ORDER — EPHEDRINE SULFATE 50 MG/ML IJ SOLN
INTRAMUSCULAR | Status: DC | PRN
  Administered 2015-07-19 (×2): 5 mg via INTRAVENOUS

## 2015-07-19 MED ORDER — INSULIN ASPART 100 UNIT/ML ~~LOC~~ SOLN
0.0000 [IU] | Freq: Four times a day (QID) | SUBCUTANEOUS | Status: DC
  Administered 2015-07-19: 4 [IU] via SUBCUTANEOUS
  Administered 2015-07-20: 2 [IU] via SUBCUTANEOUS

## 2015-07-19 MED ORDER — SUGAMMADEX SODIUM 200 MG/2ML IV SOLN
INTRAVENOUS | Status: AC
  Filled 2015-07-19: qty 2

## 2015-07-19 MED ORDER — ONDANSETRON HCL 4 MG/2ML IJ SOLN
INTRAMUSCULAR | Status: AC
  Filled 2015-07-19: qty 2

## 2015-07-19 MED ORDER — NITROGLYCERIN 0.4 MG SL SUBL: 0.4000 mg | SUBLINGUAL_TABLET | SUBLINGUAL | Status: DC | PRN

## 2015-07-19 MED ORDER — ROCURONIUM BROMIDE 100 MG/10ML IV SOLN
INTRAVENOUS | Status: DC | PRN
  Administered 2015-07-19: 50 mg via INTRAVENOUS

## 2015-07-19 MED ORDER — ISOSORBIDE MONONITRATE ER 60 MG PO TB24: 90.0000 mg | ORAL_TABLET | Freq: Every day | ORAL | Status: DC

## 2015-07-19 MED ORDER — ATORVASTATIN CALCIUM 40 MG PO TABS
40.0000 mg | ORAL_TABLET | Freq: Every day | ORAL | Status: DC
Start: 2015-07-19 — End: 2015-07-23
  Administered 2015-07-20 – 2015-07-22 (×3): 40 mg via ORAL
  Filled 2015-07-19 (×5): qty 1

## 2015-07-19 MED ORDER — LEVOTHYROXINE SODIUM 125 MCG PO TABS
125.0000 ug | ORAL_TABLET | Freq: Every day | ORAL | Status: DC
  Administered 2015-07-20 – 2015-07-23 (×4): 125 ug via ORAL
  Filled 2015-07-19 (×5): qty 1

## 2015-07-19 MED ORDER — HYDROMORPHONE 0.3 MG/ML IV SOLN
INTRAVENOUS | Status: AC
  Filled 2015-07-19: qty 25

## 2015-07-19 MED ORDER — ALPRAZOLAM 0.5 MG PO TABS
0.5000 mg | ORAL_TABLET | Freq: Three times a day (TID) | ORAL | Status: DC | PRN
  Administered 2015-07-19 – 2015-07-21 (×3): 0.5 mg via ORAL
  Filled 2015-07-19 (×3): qty 1

## 2015-07-19 MED ORDER — LACTATED RINGERS IV SOLN
INTRAVENOUS | Status: DC | PRN
  Administered 2015-07-19: 07:00:00 via INTRAVENOUS

## 2015-07-19 MED ORDER — FENTANYL CITRATE (PF) 100 MCG/2ML IJ SOLN
INTRAMUSCULAR | Status: DC | PRN
  Administered 2015-07-19 (×4): 50 ug via INTRAVENOUS
  Administered 2015-07-19: 200 ug via INTRAVENOUS

## 2015-07-19 MED ORDER — MIDAZOLAM HCL 2 MG/2ML IJ SOLN: 0.5000 mg | Freq: Once | INTRAMUSCULAR | Status: DC | PRN

## 2015-07-19 MED ORDER — FENTANYL CITRATE (PF) 250 MCG/5ML IJ SOLN
INTRAMUSCULAR | Status: AC
  Filled 2015-07-19: qty 5

## 2015-07-19 MED ORDER — KCL IN DEXTROSE-NACL 20-5-0.45 MEQ/L-%-% IV SOLN
INTRAVENOUS | Status: DC
  Administered 2015-07-19: 100 mL/h via INTRAVENOUS
  Filled 2015-07-19 (×3): qty 1000

## 2015-07-19 MED ORDER — SODIUM CHLORIDE 0.9 % IJ SOLN: 9.0000 mL | INTRAMUSCULAR | Status: DC | PRN

## 2015-07-19 MED ORDER — ONDANSETRON HCL 4 MG/2ML IJ SOLN
INTRAMUSCULAR | Status: DC | PRN
  Administered 2015-07-19: 4 mg via INTRAVENOUS

## 2015-07-19 MED ORDER — DEXTROSE 5 % IV SOLN
1.5000 g | Freq: Two times a day (BID) | INTRAVENOUS | Status: AC
  Administered 2015-07-19 – 2015-07-20 (×2): 1.5 g via INTRAVENOUS
  Filled 2015-07-19 (×3): qty 1.5

## 2015-07-19 MED ORDER — LINAGLIPTIN 5 MG PO TABS
5.0000 mg | ORAL_TABLET | Freq: Every day | ORAL | Status: DC
  Administered 2015-07-20 – 2015-07-23 (×4): 5 mg via ORAL
  Filled 2015-07-19 (×4): qty 1

## 2015-07-19 MED ORDER — ENOXAPARIN SODIUM 40 MG/0.4ML ~~LOC~~ SOLN
40.0000 mg | Freq: Every day | SUBCUTANEOUS | Status: DC
  Administered 2015-07-19 – 2015-07-22 (×4): 40 mg via SUBCUTANEOUS
  Filled 2015-07-19 (×5): qty 0.4

## 2015-07-19 MED ORDER — ACETAMINOPHEN 500 MG PO TABS
1000.0000 mg | ORAL_TABLET | Freq: Four times a day (QID) | ORAL | Status: DC
  Administered 2015-07-19 – 2015-07-23 (×13): 1000 mg via ORAL
  Filled 2015-07-19 (×19): qty 2

## 2015-07-19 MED ORDER — SUGAMMADEX SODIUM 200 MG/2ML IV SOLN
INTRAVENOUS | Status: DC | PRN
  Administered 2015-07-19: 200 mg via INTRAVENOUS

## 2015-07-19 MED ORDER — PROMETHAZINE HCL 25 MG/ML IJ SOLN: 6.2500 mg | INTRAMUSCULAR | Status: DC | PRN

## 2015-07-19 MED ORDER — PROPOFOL 10 MG/ML IV BOLUS
INTRAVENOUS | Status: AC
  Filled 2015-07-19: qty 20

## 2015-07-19 MED ORDER — BUPIVACAINE 0.5 % ON-Q PUMP SINGLE CATH 400 ML
400.0000 mL | INJECTION | Status: DC
  Administered 2015-07-19: 400 mL
  Filled 2015-07-19: qty 400

## 2015-07-19 MED ORDER — ZOLPIDEM TARTRATE 5 MG PO TABS
10.0000 mg | ORAL_TABLET | Freq: Every evening | ORAL | Status: DC | PRN
  Administered 2015-07-19 – 2015-07-22 (×3): 10 mg via ORAL
  Filled 2015-07-19 (×3): qty 2

## 2015-07-19 MED ORDER — BUPIVACAINE ON-Q PAIN PUMP (FOR ORDER SET NO CHG)
INJECTION | Status: DC
  Filled 2015-07-19: qty 1

## 2015-07-19 MED ORDER — NALOXONE HCL 0.4 MG/ML IJ SOLN: 0.4000 mg | INTRAMUSCULAR | Status: DC | PRN

## 2015-07-19 MED ORDER — BISACODYL 5 MG PO TBEC
10.0000 mg | DELAYED_RELEASE_TABLET | Freq: Every day | ORAL | Status: DC
  Administered 2015-07-19 – 2015-07-22 (×4): 10 mg via ORAL
  Filled 2015-07-19 (×5): qty 2

## 2015-07-19 MED ORDER — ROCURONIUM BROMIDE 50 MG/5ML IV SOLN
INTRAVENOUS | Status: AC
  Filled 2015-07-19: qty 1

## 2015-07-19 MED ORDER — CLOPIDOGREL BISULFATE 75 MG PO TABS
75.0000 mg | ORAL_TABLET | Freq: Every day | ORAL | Status: DC
  Administered 2015-07-20 – 2015-07-23 (×4): 75 mg via ORAL
  Filled 2015-07-19 (×4): qty 1

## 2015-07-19 MED ORDER — INSULIN GLARGINE 100 UNIT/ML SOLOSTAR PEN: 60.0000 [IU] | PEN_INJECTOR | Freq: Every day | SUBCUTANEOUS | Status: DC

## 2015-07-19 MED ORDER — FUROSEMIDE 20 MG PO TABS
20.0000 mg | ORAL_TABLET | Freq: Every day | ORAL | Status: DC
  Filled 2015-07-19: qty 1

## 2015-07-19 MED ORDER — CIPROFLOXACIN HCL 500 MG PO TABS
500.0000 mg | ORAL_TABLET | Freq: Two times a day (BID) | ORAL | Status: DC
  Administered 2015-07-19 – 2015-07-23 (×8): 500 mg via ORAL
  Filled 2015-07-19 (×10): qty 1

## 2015-07-19 MED ORDER — LOSARTAN POTASSIUM 50 MG PO TABS
100.0000 mg | ORAL_TABLET | Freq: Every day | ORAL | Status: DC
  Administered 2015-07-20 – 2015-07-23 (×3): 100 mg via ORAL
  Filled 2015-07-19 (×4): qty 2

## 2015-07-19 MED ORDER — TIOTROPIUM BROMIDE MONOHYDRATE 18 MCG IN CAPS
18.0000 ug | ORAL_CAPSULE | Freq: Every day | RESPIRATORY_TRACT | Status: DC
  Filled 2015-07-19: qty 5

## 2015-07-19 MED ORDER — MIDAZOLAM HCL 2 MG/2ML IJ SOLN
INTRAMUSCULAR | Status: AC
  Filled 2015-07-19: qty 4

## 2015-07-19 MED ORDER — MEPERIDINE HCL 25 MG/ML IJ SOLN: 6.2500 mg | INTRAMUSCULAR | Status: DC | PRN

## 2015-07-19 MED ORDER — POTASSIUM CHLORIDE 10 MEQ/50ML IV SOLN: 10.0000 meq | Freq: Every day | INTRAVENOUS | Status: DC | PRN

## 2015-07-19 MED ORDER — HYDROMORPHONE 0.3 MG/ML IV SOLN
INTRAVENOUS | Status: DC
  Administered 2015-07-19: 19 mg via INTRAVENOUS
  Administered 2015-07-19 (×2): via INTRAVENOUS
  Administered 2015-07-20: 1.2 mg via INTRAVENOUS
  Administered 2015-07-20: 1.8 mg via INTRAVENOUS
  Administered 2015-07-20: 1.2 mg via INTRAVENOUS
  Filled 2015-07-19: qty 25

## 2015-07-19 MED ORDER — HYDROMORPHONE HCL 1 MG/ML IJ SOLN
INTRAMUSCULAR | Status: AC
  Administered 2015-07-19: 0.5 mg via INTRAVENOUS
  Filled 2015-07-19: qty 1

## 2015-07-19 MED ORDER — LIDOCAINE HCL (CARDIAC) 20 MG/ML IV SOLN
INTRAVENOUS | Status: AC
  Filled 2015-07-19: qty 5

## 2015-07-19 MED ORDER — ISOSORBIDE MONONITRATE ER 60 MG PO TB24
60.0000 mg | ORAL_TABLET | Freq: Every day | ORAL | Status: DC
  Administered 2015-07-20 – 2015-07-23 (×3): 60 mg via ORAL
  Filled 2015-07-19 (×4): qty 1

## 2015-07-19 MED ORDER — ACETAMINOPHEN 160 MG/5ML PO SOLN
1000.0000 mg | Freq: Four times a day (QID) | ORAL | Status: DC
  Filled 2015-07-19 (×19): qty 40

## 2015-07-19 MED ORDER — TIOTROPIUM BROMIDE MONOHYDRATE 18 MCG IN CAPS
18.0000 ug | ORAL_CAPSULE | Freq: Every day | RESPIRATORY_TRACT | Status: DC
  Administered 2015-07-19 – 2015-07-23 (×5): 18 ug via RESPIRATORY_TRACT
  Filled 2015-07-19: qty 5

## 2015-07-19 MED ORDER — MIDAZOLAM HCL 5 MG/5ML IJ SOLN
INTRAMUSCULAR | Status: DC | PRN
  Administered 2015-07-19: 1 mg via INTRAVENOUS

## 2015-07-19 MED ORDER — BUPIVACAINE HCL (PF) 0.5 % IJ SOLN
INTRAMUSCULAR | Status: AC
  Filled 2015-07-19: qty 10

## 2015-07-19 MED ORDER — DEXTROSE 5 % IV SOLN
1.5000 g | INTRAVENOUS | Status: AC
  Administered 2015-07-19: 1.5 g via INTRAVENOUS
  Filled 2015-07-19: qty 1.5

## 2015-07-19 MED ORDER — HYDROMORPHONE HCL 1 MG/ML IJ SOLN
0.2500 mg | INTRAMUSCULAR | Status: DC | PRN
  Administered 2015-07-19 (×2): 0.5 mg via INTRAVENOUS

## 2015-07-19 MED ORDER — PROPOFOL 10 MG/ML IV BOLUS
INTRAVENOUS | Status: DC | PRN
  Administered 2015-07-19: 130 mg via INTRAVENOUS

## 2015-07-19 MED ORDER — ONDANSETRON HCL 4 MG/2ML IJ SOLN: 4.0000 mg | Freq: Four times a day (QID) | INTRAMUSCULAR | Status: DC | PRN

## 2015-07-19 MED ORDER — DIPHENHYDRAMINE HCL 50 MG/ML IJ SOLN: 12.5000 mg | Freq: Four times a day (QID) | INTRAMUSCULAR | Status: DC | PRN

## 2015-07-19 MED ORDER — DEXTROSE 5 % IV SOLN
10.0000 mg | INTRAVENOUS | Status: DC | PRN
  Administered 2015-07-19: 15 ug/min via INTRAVENOUS

## 2015-07-19 MED ORDER — 0.9 % SODIUM CHLORIDE (POUR BTL) OPTIME
TOPICAL | Status: DC | PRN
  Administered 2015-07-19: 3000 mL

## 2015-07-19 MED ORDER — INSULIN GLARGINE 100 UNIT/ML ~~LOC~~ SOLN
60.0000 [IU] | Freq: Every day | SUBCUTANEOUS | Status: DC
  Administered 2015-07-19 – 2015-07-22 (×4): 60 [IU] via SUBCUTANEOUS
  Filled 2015-07-19 (×5): qty 0.6

## 2015-07-19 MED ORDER — ASPIRIN 81 MG PO CHEW
81.0000 mg | CHEWABLE_TABLET | Freq: Every day | ORAL | Status: DC
  Administered 2015-07-19 – 2015-07-23 (×5): 81 mg via ORAL
  Filled 2015-07-19 (×5): qty 1

## 2015-07-19 MED ORDER — DILTIAZEM HCL 30 MG PO TABS
30.0000 mg | ORAL_TABLET | Freq: Four times a day (QID) | ORAL | Status: DC
  Administered 2015-07-19 – 2015-07-23 (×13): 30 mg via ORAL
  Filled 2015-07-19 (×18): qty 1

## 2015-07-19 MED ORDER — BUPIVACAINE HCL (PF) 0.5 % IJ SOLN
INTRAMUSCULAR | Status: DC | PRN
  Administered 2015-07-19: 5 mL

## 2015-07-19 MED ORDER — METOPROLOL SUCCINATE ER 100 MG PO TB24
200.0000 mg | ORAL_TABLET | Freq: Every day | ORAL | Status: DC
  Administered 2015-07-20 – 2015-07-23 (×4): 200 mg via ORAL
  Filled 2015-07-19 (×4): qty 2

## 2015-07-19 MED ORDER — DIPHENHYDRAMINE HCL 12.5 MG/5ML PO ELIX
12.5000 mg | ORAL_SOLUTION | Freq: Four times a day (QID) | ORAL | Status: DC | PRN
  Filled 2015-07-19: qty 5

## 2015-07-19 SURGICAL SUPPLY — 93 items
APPLIER CLIP ROT 10 11.4 M/L (STAPLE)
BLADE CLIPPER SPEC (BLADE) ×5 IMPLANT
BLOCK BITE 60FR ADLT L/F BLUE (MISCELLANEOUS) ×5 IMPLANT
BRUSH CYTOL CELLEBRITY 1.5X140 (MISCELLANEOUS) IMPLANT
CANISTER SUCTION 2500CC (MISCELLANEOUS) ×5 IMPLANT
CATH KIT ON Q 5IN SLV (PAIN MANAGEMENT) ×5 IMPLANT
CATH KIT ON-Q SILVERSOAK 5IN (CATHETERS) ×5 IMPLANT
CATH THORACIC 28FR (CATHETERS) IMPLANT
CATH THORACIC 28FR RT ANG (CATHETERS) IMPLANT
CATH THORACIC 36FR (CATHETERS) IMPLANT
CATH THORACIC 36FR RT ANG (CATHETERS) IMPLANT
CLIP APPLIE ROT 10 11.4 M/L (STAPLE) IMPLANT
CLIP TI MEDIUM 6 (CLIP) IMPLANT
CLOSURE STERI-STRIP 1/2X4 (GAUZE/BANDAGES/DRESSINGS) ×1
CLSR STERI-STRIP ANTIMIC 1/2X4 (GAUZE/BANDAGES/DRESSINGS) ×4 IMPLANT
CONN Y 3/8X3/8X3/8  BEN (MISCELLANEOUS) ×2
CONN Y 3/8X3/8X3/8 BEN (MISCELLANEOUS) ×3 IMPLANT
CONT SPEC 4OZ CLIKSEAL STRL BL (MISCELLANEOUS) ×25 IMPLANT
COTTONBALL LRG STERILE PKG (GAUZE/BANDAGES/DRESSINGS) IMPLANT
COVER SURGICAL LIGHT HANDLE (MISCELLANEOUS) ×5 IMPLANT
COVER TABLE BACK 60X90 (DRAPES) ×5 IMPLANT
DERMABOND ADVANCED (GAUZE/BANDAGES/DRESSINGS)
DERMABOND ADVANCED .7 DNX12 (GAUZE/BANDAGES/DRESSINGS) IMPLANT
DRAIN CHANNEL 28F RND 3/8 FF (WOUND CARE) IMPLANT
DRAIN CHANNEL 32F RND 10.7 FF (WOUND CARE) IMPLANT
DRAPE LAPAROSCOPIC ABDOMINAL (DRAPES) ×5 IMPLANT
DRAPE WARM FLUID 44X44 (DRAPE) ×5 IMPLANT
ELECT REM PT RETURN 9FT ADLT (ELECTROSURGICAL) ×5
ELECTRODE REM PT RTRN 9FT ADLT (ELECTROSURGICAL) ×3 IMPLANT
FILTER STRAW FLUID ASPIR (MISCELLANEOUS) IMPLANT
FORCEPS BIOP RJ4 1.8 (CUTTING FORCEPS) IMPLANT
GAUZE SPONGE 4X4 12PLY STRL (GAUZE/BANDAGES/DRESSINGS) ×5 IMPLANT
GLOVE SURG SIGNA 7.5 PF LTX (GLOVE) ×10 IMPLANT
GOWN STRL REUS W/ TWL LRG LVL3 (GOWN DISPOSABLE) ×6 IMPLANT
GOWN STRL REUS W/ TWL XL LVL3 (GOWN DISPOSABLE) ×3 IMPLANT
GOWN STRL REUS W/TWL LRG LVL3 (GOWN DISPOSABLE) ×4
GOWN STRL REUS W/TWL XL LVL3 (GOWN DISPOSABLE) ×2
HEMOSTAT SURGICEL 2X14 (HEMOSTASIS) IMPLANT
KIT BASIN OR (CUSTOM PROCEDURE TRAY) ×5 IMPLANT
KIT CLEAN ENDO COMPLIANCE (KITS) ×5 IMPLANT
KIT ROOM TURNOVER OR (KITS) ×5 IMPLANT
KIT SUCTION CATH 14FR (SUCTIONS) ×5 IMPLANT
LIQUID BAND (GAUZE/BANDAGES/DRESSINGS) ×5 IMPLANT
NEEDLE 22X1 1/2 (OR ONLY) (NEEDLE) IMPLANT
NEEDLE BIOPSY TRANSBRONCH 21G (NEEDLE) IMPLANT
NS IRRIG 1000ML POUR BTL (IV SOLUTION) ×10 IMPLANT
PACK CHEST (CUSTOM PROCEDURE TRAY) ×5 IMPLANT
PAD ARMBOARD 7.5X6 YLW CONV (MISCELLANEOUS) ×10 IMPLANT
POUCH ENDO CATCH II 15MM (MISCELLANEOUS) IMPLANT
POUCH SPECIMEN RETRIEVAL 10MM (ENDOMECHANICALS) IMPLANT
RELOAD STAPLER GOLD 60MM (STAPLE) ×27 IMPLANT
SEALANT PROGEL (MISCELLANEOUS) IMPLANT
SEALANT SURG COSEAL 4ML (VASCULAR PRODUCTS) IMPLANT
SEALANT SURG COSEAL 8ML (VASCULAR PRODUCTS) IMPLANT
SOLUTION ANTI FOG 6CC (MISCELLANEOUS) ×5 IMPLANT
SPECIMEN JAR MEDIUM (MISCELLANEOUS) ×5 IMPLANT
SPONGE GAUZE 4X4 12PLY STER LF (GAUZE/BANDAGES/DRESSINGS) ×10 IMPLANT
SPONGE INTESTINAL PEANUT (DISPOSABLE) IMPLANT
STAPLE ECHEON FLEX 60 POW ENDO (STAPLE) ×5 IMPLANT
STAPLER RELOAD GOLD 60MM (STAPLE) ×45
SUT PROLENE 4 0 RB 1 (SUTURE)
SUT PROLENE 4-0 RB1 .5 CRCL 36 (SUTURE) IMPLANT
SUT SILK  1 MH (SUTURE) ×4
SUT SILK 1 MH (SUTURE) ×6 IMPLANT
SUT SILK 2 0SH CR/8 30 (SUTURE) IMPLANT
SUT SILK 3 0SH CR/8 30 (SUTURE) ×5 IMPLANT
SUT VIC AB 1 CTX 36 (SUTURE)
SUT VIC AB 1 CTX36XBRD ANBCTR (SUTURE) IMPLANT
SUT VIC AB 2-0 CTX 36 (SUTURE) IMPLANT
SUT VIC AB 2-0 UR6 27 (SUTURE) IMPLANT
SUT VIC AB 3-0 MH 27 (SUTURE) IMPLANT
SUT VIC AB 3-0 X1 27 (SUTURE) ×5 IMPLANT
SUT VICRYL 2 TP 1 (SUTURE) IMPLANT
SWAB COLLECTION DEVICE MRSA (MISCELLANEOUS) IMPLANT
SYR 20ML ECCENTRIC (SYRINGE) ×5 IMPLANT
SYR 5ML LL (SYRINGE) ×5 IMPLANT
SYR 5ML LUER SLIP (SYRINGE) ×5 IMPLANT
SYR CONTROL 10ML LL (SYRINGE) IMPLANT
SYSTEM SAHARA CHEST DRAIN ATS (WOUND CARE) ×5 IMPLANT
TAPE CLOTH 4X10 WHT NS (GAUZE/BANDAGES/DRESSINGS) ×5 IMPLANT
TAPE CLOTH SURG 4X10 WHT LF (GAUZE/BANDAGES/DRESSINGS) ×10 IMPLANT
TIP APPLICATOR SPRAY EXTEND 16 (VASCULAR PRODUCTS) IMPLANT
TOWEL OR 17X24 6PK STRL BLUE (TOWEL DISPOSABLE) ×5 IMPLANT
TOWEL OR 17X26 10 PK STRL BLUE (TOWEL DISPOSABLE) ×10 IMPLANT
TRAP SPECIMEN MUCOUS 40CC (MISCELLANEOUS) ×10 IMPLANT
TRAY FOLEY CATH 16FRSI W/METER (SET/KITS/TRAYS/PACK) ×5 IMPLANT
TROCAR XCEL BLADELESS 5X75MML (TROCAR) ×5 IMPLANT
TROCAR XCEL NON-BLD 5MMX100MML (ENDOMECHANICALS) IMPLANT
TUBE ANAEROBIC SPECIMEN COL (MISCELLANEOUS) IMPLANT
TUBE CONNECTING 20'X1/4 (TUBING) ×1
TUBE CONNECTING 20X1/4 (TUBING) ×4 IMPLANT
TUNNELER SHEATH ON-Q 11GX8 DSP (PAIN MANAGEMENT) ×5 IMPLANT
WATER STERILE IRR 1000ML POUR (IV SOLUTION) ×10 IMPLANT

## 2015-07-19 NOTE — H&P (View-Only) (Signed)
PCP is [name], MD Referring Azelia Reiger is Kalman Shan, MD  Chief Complaint  Patient presents with  . Interstitial Lung Disease    evaluation of surgical lung biopsy,. Chest CT 03/23/2015     HPI: [name] is sent for consultation regarding a lung biopsy.  He is a 59 year old man with past history of tobacco abuse who presents with chief complaint of shortness of breath with exertion. His past medical history is significant for right spontaneous pneumothorax, status post right video-assisted thoracoscopy, pulmonary fibrosis, coronary artery disease, Wolff-Parkinson-White with atrial tachycardia, status post ablation, sleep apnea, type 2 diabetes, hypertension, hyperlipidemia, pneumonia, acute diastolic heart failure, COPD, and chronic kidney disease.  He had a recurrent right spontaneous pneumothorax back in 2011. I placed a chest tube in him at that time. Dr. Edwyna Shell did a right VATS and blebectomy and pleurectomy during that admission.  Over the past several years he's been having issues with shortness of breath and fatigue. He was diagnosed with pulmonary fibrosis. Recently he has had progression on his chest x-ray and has evidence of interstitial lung disease. This is of indeterminate etiology by CT scan. He now needs a lung biopsy to define more clearly what the underlying process is, so that appropriate treatment can be instituted.  He has limited his physical activity. He does wear home oxygen at 3 L nasal cannula. He complains of fatigue. He's not had any significant weight loss. He has sleep apnea but does not use CPAP. He has a history of coronary disease but has not had any anginal pain recently. He also had an ablation for Wolff-Parkinson-White syndrome. A right heart catheterization at the time of his ablation showed normal pulmonary artery pressures and preserve cardiac index. He has held his Plavix for surgical procedures without issue previously.  Zubrod Score: At  the time of surgery this patient's most appropriate activity status/level should be described as:     0    Normal activity, no symptoms     1    Restricted in physical strenuous activity but ambulatory, able to do out light work     2    Ambulatory and capable of self care, unable to do work activities, up and about >50 % of waking hours                                  3    Only limited self care, in bed greater than 50% of waking hours     4    Completely disabled, no self care, confined to bed or chair     5    Moribund    Past Medical History  Diagnosis Date  . Hypertension   . Hyperlipidemia   . Postinflammatory pulmonary fibrosis   . CAD (coronary artery disease)   . Pneumothorax on right 4/11  . COPD (chronic obstructive pulmonary disease)   . Atrial tachycardia   . Acute diastolic heart failure   . On home oxygen therapy     "3L; 24/7" (04/15/2015)  . Sleep apnea     "won't use CPAP" (04/14/2105)  . Hypothyroidism   . Type II diabetes mellitus     Past Surgical History  Procedure Laterality Date  . Coronary angioplasty with stent placement  03/05/2003; 01/13/2010    PCI & Stent  LAD & mid CX; stent to CX  (I've got 4 stents total" (04/15/2015)  . Shoulder arthroscopy  w/ rotator cuff repair Bilateral   . Bilateral vats ablation Right   . Cardiac catheterization  05/03/2007    patent stents  . Knee surgery  2012    "reattached quad"  . Knee arthroscopy Right 01/22/2013    Procedure: RIGHT ARTHROSCOPY KNEE WITH DEBRIDEMENT, abrasion chondroplasty of lateral tibial plateau, debridement of partial tear of ACL, menisectomy;  Surgeon: Jacki Cones, MD;  Location: [facility] ORS;  Service: Orthopedics;  Laterality: Right;  . Electrophysiologic study N/A 04/14/2015    Procedure: SVT Ablation;  Surgeon: [name], MD;  Location: [facility] INVASIVE CV LAB;  Service: Cardiovascular;  Laterality: N/A;  . Cardiac catheterization N/A 05/18/2015    Procedure: Right Heart Cath;   Surgeon: Lyn Records, MD;  Location: [facility] INVASIVE CV LAB;  Service: Cardiovascular;  Laterality: N/A;    Family History  Problem Relation Age of Onset  . Breast cancer Mother   . Lung cancer Father   . Diabetes type II Mother     Social History Social History  Substance Use Topics  . Smoking status: Former Smoker -- 1.50 packs/day for 35 years    Types: Cigarettes    Quit date: 10/24/2007  . Smokeless tobacco: Never Used  . Alcohol Use: 0.0 oz/week    0 Standard drinks or equivalent per week     Comment: 04/15/2015 "I've had a couple drinks in the last 8 months"    Current Outpatient Prescriptions  Medication Sig Dispense Refill  . ACCU-CHEK SOFTCLIX LANCETS lancets Check blood sugar twice daily 100 each 1  . ADVAIR HFA 115-21 MCG/ACT inhaler Inhale 2 puffs into the lungs 2 (two) times daily.    Marland Kitchen albuterol (PROVENTIL HFA;VENTOLIN HFA) 108 (90 BASE) MCG/ACT inhaler Inhale 2 puffs into the lungs every 6 (six) hours as needed for wheezing. 1 Inhaler 0  . ALPRAZolam (XANAX) 0.5 MG tablet Take 1 tablet (0.5 mg total) by mouth 3 (three) times daily as needed for anxiety. 90 tablet 0  . aspirin 81 MG tablet Take 81 mg by mouth daily.    Marland Kitchen atorvastatin (LIPITOR) 40 MG tablet Take 1 tablet by mouth  every night at bedtime 90 tablet 1  . B-D ULTRAFINE III SHORT PEN 31G X 8 MM MISC Use as directed 240 each 3  . clopidogrel (PLAVIX) 75 MG tablet Take 1 tablet by mouth  every day 90 tablet 3  . diltiazem (CARDIZEM) 30 MG tablet Take 1 tablet (30 mg total) by mouth every 6 (six) hours. 360 tablet 3  . furosemide (LASIX) 20 MG tablet Take 1 tablet (20 mg total) by mouth daily as needed for fluid (fluid). 90 tablet 3  . glucose blood (ACCU-CHEK AVIVA PLUS) test strip Check bs 4-5 times per day DX-250.00 (Patient taking differently: 1 each by Other route See admin instructions. Check blood sugar 2 times daily) 100 each 11  . Insulin Glargine (LANTUS SOLOSTAR) 100 UNIT/ML Solostar Pen Inject 60  Units into the skin at bedtime.    . insulin lispro (HUMALOG) 100 UNIT/ML KiwkPen Inject 10units into the skin with breakfast and lunch, and inject 20units into the skin with dinner. 30 mL 11  . isosorbide mononitrate (IMDUR) 60 MG 24 hr tablet Take 1 and 1/2 tablets by  mouth every day (Patient taking differently: Take 60 mg by  mouth every day) 135 tablet 1  . levothyroxine (SYNTHROID, LEVOTHROID) 125 MCG tablet Take 1 tablet (125 mcg total) by mouth daily. 90 tablet 3  . linagliptin (TRADJENTA)  5 MG TABS tablet Take 1 tablet (5 mg total) by mouth daily. 90 tablet 3  . losartan (COZAAR) 100 MG tablet Take 1 tablet (100 mg total) by mouth daily. 90 tablet 3  . Menthol, Topical Analgesic, (BIOFREEZE EX) Apply 1 application topically as needed (for pain).    . metFORMIN (GLUCOPHAGE) 850 MG tablet Take 1 tablet by mouth  twice a day 180 tablet 0  . metoprolol (TOPROL XL) 200 MG 24 hr tablet Take 1 tablet (200 mg total) by mouth daily. 90 tablet 3  . nitroGLYCERIN (NITROSTAT) 0.4 MG SL tablet Place 1 tablet (0.4 mg total) under the tongue every 5 (five) minutes as needed for chest pain. 25 tablet 1  . SYRINGE/NEEDLE, DISP, 1 ML (B-D SYRINGE/NEEDLE 1CC/25GX5/8) 25G X 5/8" 1 ML MISC As directed 100 each 5  . tiotropium (SPIRIVA) 18 MCG inhalation capsule Place 18 mcg into inhaler and inhale daily.     Marland Kitchen zolpidem (AMBIEN) 10 MG tablet Take 1 tablet (10 mg total) by mouth at bedtime as needed for sleep. 90 tablet 1   No current facility-administered medications for this visit.    Allergies  Allergen Reactions  . Niaspan [Niacin] Itching  . Altace [Ramipril] Other (See Comments)    Cough     Review of Systems  Constitutional: Positive for activity change (limited by SOB and right knee giving out) and fatigue. Negative for fever, chills, appetite change and unexpected weight change.  HENT: Positive for hearing loss. Negative for congestion.   Respiratory: Positive for apnea and shortness of  breath.        Home O2. Right CW pain sporadic since surgery.  Cardiovascular: Negative for chest pain and leg swelling.  Gastrointestinal: Negative.   Genitourinary: Negative.   Musculoskeletal: Positive for arthralgias and gait problem (right knee "gives out").  Neurological: Positive for numbness. Negative for syncope and weakness.  Hematological: Negative for adenopathy. Bruises/bleeds easily.  All other systems reviewed and are negative.   BP 130/86 mmHg  Pulse 62  Resp 20  Ht 6\' 1"  (1.854 m)  Wt 265 lb (120.203 kg)  BMI 34.97 kg/m2  SpO2 98% Physical Exam  Constitutional: He is oriented to person, place, and time. He appears well-developed and well-nourished. No distress.  HENT:  Head: Normocephalic and atraumatic.  Mouth/Throat: No oropharyngeal exudate.  Eyes: Conjunctivae and EOM are normal. No scleral icterus.  Neck: Neck supple. No tracheal deviation present. No thyromegaly present.  Cardiovascular: Normal rate, regular rhythm, normal heart sounds and intact distal pulses.   No murmur heard. Pulmonary/Chest: No stridor.  Abdominal: Soft. There is no tenderness.  Musculoskeletal: Normal range of motion. He exhibits no edema.  Lymphadenopathy:    He has no cervical adenopathy.  Neurological: He is alert and oriented to person, place, and time. No cranial nerve deficit. He exhibits normal muscle tone.  Skin: Skin is warm and dry. No rash noted.  Psychiatric: He has a normal mood and affect.  Vitals reviewed.    Diagnostic Tests: CT CHEST WITHOUT CONTRAST  TECHNIQUE: Multidetector CT imaging of the chest was performed following the standard protocol without intravenous contrast. High resolution imaging of the lungs, as well as inspiratory and expiratory imaging, was performed.  COMPARISON: Chest CT 06/11/2013.  FINDINGS: Mediastinum/Lymph Nodes: Heart size is borderline enlarged. There is no significant pericardial fluid, thickening or  pericardial calcification. There is atherosclerosis of the thoracic aorta, the great vessels of the mediastinum and the coronary arteries, including calcified atherosclerotic plaque in  the left main, left anterior descending, left circumflex and right coronary arteries. Dilatation of the pulmonic trunk (4 cm in diameter). Multiple borderline enlarged and mildly enlarged mediastinal and bilateral hilar lymph nodes, largest of which measures up to 19 mm in short axis in the low right paratracheal nodal station. The majority of these lymph nodes are densely calcified. Esophagus is unremarkable in appearance. No axillary lymphadenopathy.  Lungs/Pleura: High-resolution images demonstrate worsening areas of predominantly peribronchovascular ground-glass attenuation scattered in a random distribution in the lungs bilaterally, most evident in the lateral segment of the right middle lobe, medial aspect of the basal segments of the right lower lobe, and in the posterior aspect of the left upper lobe. In the areas of greatest involvement there is some associated septal thickening, marked thickening of the peribronchovascular interstitium and focal architectural distortion. Minimal cylindrical bronchiectasis is identified, as well as some peripheral bronchiolectasis noted in the areas of greatest involvement. There is also a background of diffuse bronchial wall thickening with mild centrilobular and paraseptal emphysema. Chronic right-sided pleural thickening posteriorly is again noted. No pleural effusions. No definite suspicious appearing pulmonary nodules or masses are noted. Chronic bilateral apical pleuroparenchymal thickening, somewhat nodular in appearance, unchanged compared to the prior examination, most compatible with chronic post infectious/inflammatory scarring.  Upper Abdomen: Unremarkable.  Musculoskeletal/Soft Tissues: There are no aggressive appearing lytic or blastic  lesions noted in the visualized portions of the skeleton.  IMPRESSION: 1. Unusual appearance of the lung parenchyma, compatible with a progressively worsening interstitial lung disease. Given the predominant peribronchovascular pattern and the presence of calcified mediastinal and bilateral hilar lymphadenopathy, the possibility of interstitial lung changes related to underlying sarcoidosis should be considered. The asymmetric distribution of disease and lack of honeycombing suggests against usual interstitial pneumonia (UIP). Progressive disease would be unusual for an entity such as nonspecific interstitial pneumonia (NSIP). Overall, the imaging findings are nonspecific. Repeat high-resolution chest CT in 12 months would be useful to assess for further temporal changes in the appearance of the lung parenchyma if clinically appropriate. 2. Mild diffuse bronchial wall thickening with mild centrilobular and paraseptal emphysema. 3. Dilatation of the pulmonic trunk (4.0 cm in diameter), suggestive of pulmonary arterial hypertension. 4. Atherosclerosis, including left main and 3 vessel coronary artery disease. Please note that although the presence of coronary artery calcium documents the presence of coronary artery disease, the severity of this disease and any potential stenosis cannot be assessed on this non-gated CT examination. Assessment for potential risk factor modification, dietary therapy or pharmacologic therapy may be warranted, if clinically indicated.   Electronically Signed  By: Trudie Reed M.D.  On: 03/23/2015 17:14  Pulmonary function testing  FVC equals 3.48 (64%) FEV1 equals 2.52 (61%), 2.63 postbronchodilator DLCO 39%  I personally reviewed the CT chest, right heart catheterization, and we'll make function testing and concur with the findings as indicated above.  Impression: 59 year old man with multiple medical problems including interstitial lung  disease/pulmonary fibrosis. He is dyspneic and requires home oxygen. CT findings are not characteristic for any specific interstitial lung disease and he needs a biopsy to determine the underlying process and guide therapy. He understands that this would be a diagnostic, not a therapeutic, procedure.  I discussed the ongoing study with bronchoscopy, transbronchial biopsy, and bronchoalveolar lavage with him. This would be done at the time of his lung biopsy. He is interested in today stating in the study and we'll look forward to hearing from Dr. Jane Canary team regarding that.  I described  the proposed operation to him. This will be bronchoscopy, left VATS, lung biopsy. He is familiar with the general nature of the video-assisted thoracoscopic surgery given his previous surgery on the opposite side. We discussed the expected hospital stay and overall recovery. I reviewed the indications, risks, benefits, and alternatives. He understands the risk include, but are not limited to death, MI, DVT, PE, bleeding, possible need for transfusion, infection, prolonged air leak, cardiac arrhythmias, as well as the possibility of other unforeseeable complications. He accepts those risks and wishes to proceed.  He says he has held his Plavix prior to invasive procedures on multiple occasions without any issues related to his stents. He will need to hold for 5 days prior to surgery  Plan: Bronchoscopy, left VATS, lung biopsy on Monday, 07/19/2015.  He will hold Plavix after his dose on Tuesday, 07/13/2015  [name], MD [facility] [phone]

## 2015-07-19 NOTE — Interval H&P Note (Signed)
History and Physical Interval Note:  07/19/2015 7:19 AM  Jerry Montgomery  has presented today for surgery, with the diagnosis of ILD  The various methods of treatment have been discussed with the patient and family. After consideration of risks, benefits and other options for treatment, the patient has consented to  Procedure(s): VIDEO BRONCHOSCOPY WITH FLUORO (N/A) VIDEO ASSISTED THORACOSCOPY (Left) LUNG BIOPSY (Left) as a surgical intervention .  The patient's history has been reviewed, patient examined, no change in status, stable for surgery.  I have reviewed the patient's chart and labs.  Questions were answered to the patient's satisfaction.     [name]

## 2015-07-19 NOTE — Transfer of Care (Signed)
Immediate Anesthesia Transfer of Care Note  Patient: Jerry Montgomery  Procedure(s) Performed: Procedure(s): VIDEO BRONCHOSCOPY WITH FLUORO (N/A) LEFT VIDEO ASSISTED THORACOSCOPY WITH LEFT UPPER AND LOWER LUNG BIOPSY (Left)  Patient Location: PACU  Anesthesia Type:General  Level of Consciousness: awake, alert  and oriented  Airway & Oxygen Therapy: Patient Spontanous Breathing and Patient connected to face mask oxygen  Post-op Assessment: Report given to RN and Post -op Vital signs reviewed and stable  Post vital signs: Reviewed and stable  Last Vitals:  Filed Vitals:   07/19/15 0548  BP: 109/53  Pulse: 50  Temp: 36.4 C  Resp: 20    Complications: No apparent anesthesia complications

## 2015-07-19 NOTE — Anesthesia Postprocedure Evaluation (Signed)
  Anesthesia Post-op Note  Patient: Jerry Montgomery  Procedure(s) Performed: Procedure(s): VIDEO BRONCHOSCOPY WITH FLUORO (N/A) LEFT VIDEO ASSISTED THORACOSCOPY WITH LEFT UPPER AND LOWER LUNG BIOPSY (Left)  Patient Location: PACU  Anesthesia Type:General  Level of Consciousness: awake, alert , oriented and patient cooperative  Airway and Oxygen Therapy: Patient Spontanous Breathing and Patient connected to nasal cannula oxygen  Post-op Pain: mild  Post-op Assessment: Post-op Vital signs reviewed, Patient's Cardiovascular Status Stable, Respiratory Function Stable, Patent Airway, No signs of Nausea or vomiting and Pain level controlled              Post-op Vital Signs: Reviewed and stable  Last Vitals:  Filed Vitals:   07/19/15 1215  BP:   Pulse: 56  Temp:   Resp: 14    Complications: No apparent anesthesia complications

## 2015-07-19 NOTE — Brief Op Note (Signed)
07/19/2015  10:00 AM  PATIENT:  Jerry Montgomery  59 y.o. male  PRE-OPERATIVE DIAGNOSIS:  Interstitial Lung Disease   POST-OPERATIVE DIAGNOSIS:  Interstitial Lung Disease   PROCEDURE:  Procedure(s): VIDEO BRONCHOSCOPY WITH FLUORO (N/A) LEFT VIDEO ASSISTED THORACOSCOPY WITH LEFT UPPER AND LOWER LUNG BIOPSY (Left)  SURGEON:  Surgeon(s) and Role:    * [name], MD - Primary   ASSISTANTS: Ronn Melena, RNFA   ANESTHESIA:   general  EBL:  Total I/O In: 300 [I.V.:300] Out: -   BLOOD ADMINISTERED:none  DRAINS: 1 Chest Tube(s) in the left chest   LOCAL MEDICATIONS USED:  MARCAINE     SPECIMEN:  Source of Specimen:  LUL and LLL  DISPOSITION OF SPECIMEN:  PATHOLOGY  COUNTS:  YES  PLAN OF CARE: Admit to inpatient   PATIENT DISPOSITION:  PACU - hemodynamically stable.   Delay start of Pharmacological VTE agent (>24hrs) due to surgical blood loss or risk of bleeding: no  FROZEN- interstitial lung disease

## 2015-07-19 NOTE — Anesthesia Procedure Notes (Addendum)
Anesthesia Procedure Note CVP: Timeout, sterile prep, drape, FBP L neck.  Trendelenburg position.  1% lido local, finder and trocar LIJ 1st pass with US guidance.  2 lumen placed over J wire by Seward Speck, MD. Biopatch and sterile dressing on.  Patient tolerated well.  VSS.  Jenita Seashore, MD Procedure Name: Intubation Date/Time: 07/19/2015 7:43 AM Performed by: Clearnce Sorrel Pre-anesthesia Checklist: Patient identified, Timeout performed, Emergency Drugs available, Suction available and Patient being monitored Patient Re-evaluated:Patient Re-evaluated prior to inductionOxygen Delivery Method: Circle system utilized Preoxygenation: Pre-oxygenation with 100% oxygen Intubation Type: IV induction Ventilation: Mask ventilation without difficulty Laryngoscope Size: Mac and 4 Grade View: Grade III Tube type: Oral Tube size: 9.0 mm Number of attempts: 1 Airway Equipment and Method: Stylet Placement Confirmation: breath sounds checked- equal and bilateral and positive ETCO2 Secured at: 23 cm Tube secured with: Tape Dental Injury: Teeth and Oropharynx as per pre-operative assessment

## 2015-07-19 NOTE — Progress Notes (Signed)
Pt arrived to 3 south, pt A&0x4, CT dressing intact with minimal drainage. VSS, pt's family at bedside. Will continue to monitor.

## 2015-07-20 ENCOUNTER — Inpatient Hospital Stay (HOSPITAL_COMMUNITY): Payer: Medicare Other

## 2015-07-20 ENCOUNTER — Encounter (HOSPITAL_COMMUNITY): Payer: Self-pay | Admitting: Thoracic Surgery (Cardiothoracic Vascular Surgery)

## 2015-07-20 LAB — BASIC METABOLIC PANEL
Anion gap: 8 (ref 5–15)
BUN: 19 mg/dL (ref 6–20)
CHLORIDE: 104 mmol/L (ref 101–111)
CO2: 24 mmol/L (ref 22–32)
CREATININE: 1.44 mg/dL — AB (ref 0.61–1.24)
Calcium: 8.4 mg/dL — ABNORMAL LOW (ref 8.9–10.3)
GFR calc Af Amer: 59 mL/min — ABNORMAL LOW (ref >60)
GFR calc non Af Amer: 51 mL/min — ABNORMAL LOW (ref >60)
Glucose, Bld: 135 mg/dL — ABNORMAL HIGH (ref 65–99)
POTASSIUM: 4.5 mmol/L (ref 3.5–5.1)
SODIUM: 136 mmol/L (ref 135–145)

## 2015-07-20 LAB — GLUCOSE, CAPILLARY
GLUCOSE-CAPILLARY: 175 mg/dL — AB (ref 65–99)
GLUCOSE-CAPILLARY: 154 mg/dL — AB (ref 65–99)
GLUCOSE-CAPILLARY: 177 mg/dL — AB (ref 65–99)
GLUCOSE-CAPILLARY: 216 mg/dL — AB (ref 65–99)
GLUCOSE-CAPILLARY: 243 mg/dL — AB (ref 65–99)

## 2015-07-20 LAB — CBC
HEMATOCRIT: 37 % — AB (ref 39.0–52.0)
Hemoglobin: 12.4 g/dL — ABNORMAL LOW (ref 13.0–17.0)
MCH: 30.5 pg (ref 26.0–34.0)
MCHC: 33.5 g/dL (ref 30.0–36.0)
MCV: 91.1 fL (ref 78.0–100.0)
PLATELETS: 152 10*3/uL (ref 150–400)
RBC: 4.06 MIL/uL — ABNORMAL LOW (ref 4.22–5.81)
RDW: 13.4 % (ref 11.5–15.5)
WBC: 8.9 10*3/uL (ref 4.0–10.5)

## 2015-07-20 LAB — BLOOD GAS, ARTERIAL
Acid-Base Excess: 0.3 mmol/L (ref 0.0–2.0)
BICARBONATE: 25.2 meq/L — AB (ref 20.0–24.0)
O2 Content: 5 L/min
O2 Saturation: 93.1 %
PCO2 ART: 46 mmHg — AB (ref 35.0–45.0)
PH ART: 7.355 (ref 7.350–7.450)
Patient temperature: 98.2
TCO2: 26.6 mmol/L (ref 0–100)
pO2, Arterial: 68.9 mmHg — ABNORMAL LOW (ref 80.0–100.0)

## 2015-07-20 MED ORDER — FUROSEMIDE 10 MG/ML IJ SOLN
40.0000 mg | Freq: Once | INTRAMUSCULAR | Status: AC
  Administered 2015-07-20: 40 mg via INTRAVENOUS
  Filled 2015-07-20: qty 4

## 2015-07-20 MED ORDER — FUROSEMIDE 20 MG PO TABS
20.0000 mg | ORAL_TABLET | Freq: Every day | ORAL | Status: DC
  Filled 2015-07-20: qty 1

## 2015-07-20 MED ORDER — HYDROMORPHONE HCL 1 MG/ML IJ SOLN: 0.2500 mg | INTRAMUSCULAR | Status: DC | PRN

## 2015-07-20 MED ORDER — PROMETHAZINE HCL 25 MG/ML IJ SOLN: 6.2500 mg | INTRAMUSCULAR | Status: DC | PRN

## 2015-07-20 MED ORDER — ONDANSETRON HCL 4 MG/2ML IJ SOLN: 4.0000 mg | Freq: Four times a day (QID) | INTRAMUSCULAR | Status: DC | PRN

## 2015-07-20 MED ORDER — DIPHENHYDRAMINE HCL 50 MG/ML IJ SOLN: 12.5000 mg | Freq: Four times a day (QID) | INTRAMUSCULAR | Status: DC | PRN

## 2015-07-20 MED ORDER — NALOXONE HCL 0.4 MG/ML IJ SOLN: 0.4000 mg | INTRAMUSCULAR | Status: DC | PRN

## 2015-07-20 MED ORDER — HYDROMORPHONE 0.3 MG/ML IV SOLN
INTRAVENOUS | Status: DC
  Administered 2015-07-20: 1.8 mg via INTRAVENOUS
  Administered 2015-07-20: 0.9 mg via INTRAVENOUS
  Administered 2015-07-20: 21:00:00 via INTRAVENOUS
  Administered 2015-07-21: 0.3 mg via INTRAVENOUS
  Administered 2015-07-21: 0.9 mg via INTRAVENOUS
  Administered 2015-07-21: 0.3 mg via INTRAVENOUS
  Administered 2015-07-21: 0 mg via INTRAVENOUS
  Administered 2015-07-21: 0.6 mg via INTRAVENOUS
  Administered 2015-07-21: 0.9 mg via INTRAVENOUS
  Administered 2015-07-22: 0.3 mg via INTRAVENOUS
  Administered 2015-07-22: 0.5 mg via INTRAVENOUS
  Administered 2015-07-22: 0.6 mg via INTRAVENOUS
  Filled 2015-07-20: qty 25

## 2015-07-20 MED ORDER — MEPERIDINE HCL 25 MG/ML IJ SOLN: 6.2500 mg | INTRAMUSCULAR | Status: DC | PRN

## 2015-07-20 MED ORDER — DIPHENHYDRAMINE HCL 12.5 MG/5ML PO ELIX
12.5000 mg | ORAL_SOLUTION | Freq: Four times a day (QID) | ORAL | Status: DC | PRN
  Filled 2015-07-20: qty 5

## 2015-07-20 MED ORDER — SODIUM CHLORIDE 0.9 % IJ SOLN: 9.0000 mL | INTRAMUSCULAR | Status: DC | PRN

## 2015-07-20 MED ORDER — INSULIN ASPART 100 UNIT/ML ~~LOC~~ SOLN
0.0000 [IU] | Freq: Three times a day (TID) | SUBCUTANEOUS | Status: DC
  Administered 2015-07-20 (×2): 4 [IU] via SUBCUTANEOUS
  Administered 2015-07-21: 11 [IU] via SUBCUTANEOUS
  Administered 2015-07-21: 7 [IU] via SUBCUTANEOUS
  Administered 2015-07-21: 11 [IU] via SUBCUTANEOUS
  Administered 2015-07-22 (×2): 4 [IU] via SUBCUTANEOUS
  Administered 2015-07-22: 7 [IU] via SUBCUTANEOUS
  Administered 2015-07-23: 4 [IU] via SUBCUTANEOUS
  Administered 2015-07-23: 3 [IU] via SUBCUTANEOUS

## 2015-07-20 MED ORDER — MIDAZOLAM HCL 2 MG/2ML IJ SOLN: 0.5000 mg | Freq: Once | INTRAMUSCULAR | Status: DC | PRN

## 2015-07-20 NOTE — Progress Notes (Signed)
Attempted to get pt up oob, pt refused and said it was to early, explained importance of getting oob he said would later not now. Will continue to monitor.

## 2015-07-20 NOTE — Care Management Note (Addendum)
Case Management Note  Patient Details  Name: Jerry Montgomery MRN: [id number] Date of Birth: 06-21-1956  Subjective/Objective:                 Admitted s/p LEFT VIDEO ASSISTED THORACOSCOPY WITH LEFT UPPER AND LOWER LUNG BIOPSY. Independent with ADL's. Lives alone.  Action/Plan: Return to home when medically stable. CM to f/u with d/c needs.  Expected Discharge Date:                  Expected Discharge Plan:  Home/Self Care  In-House Referral:     Discharge planning Services     Post Acute Care Choice:    Choice offered to:  Spouse  DME Arranged:  Oxygen (active with [employer]) DME Agency:  Other - Comment ([employer])  HH Arranged:    HH Agency:     Status of Service:  In process, will continue to follow  Medicare Important Message Given:    Date Medicare IM Given:    Medicare IM give by:    Date Additional Medicare IM Given:    Additional Medicare Important Message give by:     If discussed at Long Length of Stay Meetings, dates discussed:   Jerry Montgomery ([id number])[phone] Jerry Montgomery ([phone]  Additional Comments: Pt plans to care for self @ d/c. States daughter and ex-wife will  assist with care if needed once d/c Jerry Montgomery [place], [place] [phone] 07/20/2015, 3:28 PM

## 2015-07-20 NOTE — Progress Notes (Signed)
Wasted 0ml of hydromorphone pca with Leta Baptist Rn as witness.

## 2015-07-20 NOTE — Op Note (Signed)
[name], [name]:  [id number]  MEDICAL RECORD NO.:  [id number]  LOCATION:  3S05C                        FACILITY:  [facility]  PHYSICIAN:  Salvatore Decent. Dorris Fetch, M.D.DATE OF BIRTH:  [birth year]  DATE OF PROCEDURE:  07/19/2015 DATE OF DISCHARGE:                              OPERATIVE REPORT   PREOPERATIVE DIAGNOSIS:  Interstitial lung disease.  POSTOPERATIVE DIAGNOSIS:  Interstitial lung disease.  PROCEDURE: 1. Video bronchoscopy, with bronchoalveolar lavage and biopsies. 2. Left video-assisted thoracoscopy, lung biopsy (upper and lower lobe     x2).  SURGEON:  Salvatore Decent. Dorris Fetch, M.D.  ASSISTANTRonn Melena, RNFA.  ANESTHESIA:  General.  FINDINGS:  Bronchoscopy revealed normal endobronchial anatomy, with no endobronchial lesions.  Thick white secretions.  VATS- The lung had a nodular texture. Frozen section of initial biopsy showed interstitial lung disease. Remaining biopsies sent for permanent section only.  CLINICAL NOTE:  Jerry Montgomery is a 59 year old man with interstitial lung disease.  He is dyspneic and requires home oxygen.  CT findings are not characteristic for a specific type of interstitial lung disease.  We need to biopsy to determine the underlying process.  He was advised to undergo a lung biopsy.  He wished to participate in a clinical study, obtaining samples bronchoscopically as well.  The indications, risks, benefits, and alternatives were discussed in detail with the patient.  He understood this was a diagnostic and not a therapeutic procedure.  He accepted the risks and agreed to proceed.  OPERATIVE NOTE:  Jerry Montgomery was brought to the operating room on July 19, 2015.  He was given intravenous antibiotics.  He was anesthetized and intubated with a single-lumen endotracheal tube. Flexible fiberoptic bronchoscopy was performed via the endotracheal tube.  It revealed normal endobronchial anatomy.  There were  no endobronchial mass lesions.  There were some thick white secretions, a portion of the washings were sent for cultures.  Per the clinical study protocol, the bronchoscope was directed to the left upper lobe and bronchoalveolar lavage was performed.  150 mL of saline was instilled into the left upper lobe and then was evacuated.  Next, the process was repeated in the left lower lobe.  After doing the lavage, the bronchoscope was once again directed to the left upper lobe and 4 biopsies were performed.  The process again was repeated in the left lower lobe.  There was minimal bleeding with the biopsies, but nothing of concern.  The patient was reintubated with a double-lumen endotracheal tube. Sequential compressive devices were placed on the calves for DVT prophylaxis.  He was placed in a right lateral decubitus position and the left chest was prepped and draped in the usual sterile fashion. Single lung ventilation of the right lung was initiated and was tolerated well throughout the procedure.  An incision was made in the seventh intercostal space in the midaxillary line. It was carried through the skin and subcutaneous tissue.  A 5 mm port was used to enter the chest.  The thoracoscope was advanced.  There was good isolation of the left lung.  The lung was nodular and had significant anthracotic pigment throughout.  A small working incision was made in  approximately the fourth interspace anterolaterally.  No rib spreading was performed during the procedure.  The worst appearance of the lung was on the lateral aspect of the left upper lobe along the fissure.  A biopsy was taken from this area with sequential firings of endoscopic GIA stapler. The Ethicon stapler with gold cartridges was used for all the biopsies. A small portion of the biopsy was sent for fungal and AFB cultures.  The remainder was sent for Pathology. A frozen section was done on this specimen.  While awaiting the  results of the frozen section, the lingula was identified and the tip of the lingula was removed and sent for permanent pathology.  Next, areas in the anterior basilar and posterior basilar left lower lobe were biopsied as well.  These biopsies were also sent for permanent pathology only.  There was no bleeding from the staple lines.  An On-Q local anesthetic catheter was placed through a separate stab incision posteriorly and tunneled into a subpleural location.  It was secured to skin with a 3-0 silk suture.  It was primed with 5 mL of 0.5% Marcaine.  The frozen section returned showing interstitial lung disease, confirming there was an adequate sample.  A 28-French chest tube was placed through the original port incision and secured with #1 silk suture.  The lung was reinflated.  The utility incision was closed in standard fashion.  The chest tube was placed to suction.  He was placed back in a supine position.  He was extubated in the operating room and taken to the postanesthetic care unit in good condition.     Salvatore Decent Dorris Fetch, M.D.     SCH/MEDQ  D:  07/19/2015  T:  07/20/2015  Job:  [id number]

## 2015-07-20 NOTE — Progress Notes (Signed)
1 Day Post-Op Procedure(s) (LRB): VIDEO BRONCHOSCOPY WITH FLUORO (N/A) LEFT VIDEO ASSISTED THORACOSCOPY WITH LEFT UPPER AND LOWER LUNG BIOPSY (Left) Subjective: Some incisional pain  Objective: Vital signs in last 24 hours: Temp:  [97.2 F (36.2 C)-99.9 F (37.7 C)] 98.3 F (36.8 C) (09/27 0801) Pulse Rate:  [55-76] 63 (09/27 0750) Cardiac Rhythm:  [-] Normal sinus rhythm (09/27 0750) Resp:  [5-20] 14 (09/27 0750) BP: (95-122)/(54-71) 106/65 mmHg (09/27 0322) SpO2:  [87 %-100 %] 91 % (09/27 0810) Arterial Line BP: (106-146)/(52-65) 106/63 mmHg (09/27 0750)  Hemodynamic parameters for last 24 hours:    Intake/Output from previous day: 09/26 0701 - 09/27 0700 In: 3370 [P.O.:720; I.V.:2200; IV Piggyback:50] Out: 1805 [Urine:1330; Chest Tube:475] Intake/Output this shift:    General appearance: alert, cooperative and no distress Neurologic: intact Heart: regular rate and rhythm Lungs: rales bilaterally Abdomen: normal findings: soft, non-tender no air leak, serosanguinous drainage  Lab Results:  Recent Labs  07/20/15 0320  WBC 8.9  HGB 12.4*  HCT 37.0*  PLT 152   BMET:  Recent Labs  07/20/15 0320  NA 136  K 4.5  CL 104  CO2 24  GLUCOSE 135*  BUN 19  CREATININE 1.44*  CALCIUM 8.4*    PT/INR: No results for input(s): LABPROT, INR in the last 72 hours. ABG    Component Value Date/Time   PHART 7.355 07/20/2015 0332   HCO3 25.2* 07/20/2015 0332   TCO2 26.6 07/20/2015 0332   ACIDBASEDEF 0.4 07/15/2015 1026   O2SAT 93.1 07/20/2015 0332   CBG (last 3)   Recent Labs  07/19/15 1740 07/19/15 2329 07/20/15 0610  GLUCAP 82 175* 154*    Assessment/Plan: S/P Procedure(s) (LRB): VIDEO BRONCHOSCOPY WITH FLUORO (N/A) LEFT VIDEO ASSISTED THORACOSCOPY WITH LEFT UPPER AND LOWER LUNG BIOPSY (Left) POD # 1 lung biopsy  Overall doing well  CV- resume home meds  RESP- s/p lung biopsy- no air leak- CT to water seal  Moderate drainage dc CT when < 200 ml/  day  RENAL- creatinine at baseline, mildly elevated  ENDO- CBG mildly elevated, on lantus, restart tradjenta, SSI  DVT prophylaxis- SCD + enoxaparin  mobilize   LOS: 1 day    [name] 07/20/2015

## 2015-07-21 ENCOUNTER — Inpatient Hospital Stay (HOSPITAL_COMMUNITY): Payer: Medicare Other

## 2015-07-21 LAB — GLUCOSE, CAPILLARY
Glucose-Capillary: 234 mg/dL — ABNORMAL HIGH (ref 65–99)
Glucose-Capillary: 292 mg/dL — ABNORMAL HIGH (ref 65–99)
Glucose-Capillary: 291 mg/dL — ABNORMAL HIGH (ref 65–99)
Glucose-Capillary: 180 mg/dL — ABNORMAL HIGH (ref 65–99)

## 2015-07-21 LAB — CULTURE, RESPIRATORY W GRAM STAIN: Culture: NO GROWTH

## 2015-07-21 LAB — COMPREHENSIVE METABOLIC PANEL
ALBUMIN: 2.8 g/dL — AB (ref 3.5–5.0)
ALT: 33 U/L (ref 17–63)
AST: 73 U/L — AB (ref 15–41)
Alkaline Phosphatase: 46 U/L (ref 38–126)
Anion gap: 7 (ref 5–15)
BUN: 21 mg/dL — AB (ref 6–20)
CHLORIDE: 102 mmol/L (ref 101–111)
CO2: 27 mmol/L (ref 22–32)
CREATININE: 1.52 mg/dL — AB (ref 0.61–1.24)
Calcium: 8.7 mg/dL — ABNORMAL LOW (ref 8.9–10.3)
GFR calc Af Amer: 55 mL/min — ABNORMAL LOW (ref >60)
GFR calc non Af Amer: 48 mL/min — ABNORMAL LOW (ref >60)
GLUCOSE: 248 mg/dL — AB (ref 65–99)
POTASSIUM: 4.3 mmol/L (ref 3.5–5.1)
Sodium: 136 mmol/L (ref 135–145)
Total Bilirubin: 1.2 mg/dL (ref 0.3–1.2)
Total Protein: 5.9 g/dL — ABNORMAL LOW (ref 6.5–8.1)

## 2015-07-21 LAB — CBC
HEMATOCRIT: 36.6 % — AB (ref 39.0–52.0)
Hemoglobin: 12.1 g/dL — ABNORMAL LOW (ref 13.0–17.0)
MCH: 30.3 pg (ref 26.0–34.0)
MCHC: 33.1 g/dL (ref 30.0–36.0)
MCV: 91.5 fL (ref 78.0–100.0)
PLATELETS: 140 10*3/uL — AB (ref 150–400)
RBC: 4 MIL/uL — AB (ref 4.22–5.81)
RDW: 13.2 % (ref 11.5–15.5)
WBC: 12.9 10*3/uL — AB (ref 4.0–10.5)

## 2015-07-21 LAB — CULTURE, RESPIRATORY: GRAM STAIN: NONE SEEN

## 2015-07-21 MED ORDER — FUROSEMIDE 40 MG PO TABS
40.0000 mg | ORAL_TABLET | Freq: Every day | ORAL | Status: DC
  Administered 2015-07-21 – 2015-07-23 (×3): 40 mg via ORAL
  Filled 2015-07-21 (×4): qty 1

## 2015-07-21 MED ORDER — INSULIN ASPART 100 UNIT/ML ~~LOC~~ SOLN
10.0000 [IU] | Freq: Three times a day (TID) | SUBCUTANEOUS | Status: DC
  Administered 2015-07-21 – 2015-07-23 (×7): 10 [IU] via SUBCUTANEOUS

## 2015-07-21 MED ORDER — METFORMIN HCL 850 MG PO TABS
850.0000 mg | ORAL_TABLET | Freq: Two times a day (BID) | ORAL | Status: DC
  Administered 2015-07-21 – 2015-07-23 (×4): 850 mg via ORAL
  Filled 2015-07-21 (×8): qty 1

## 2015-07-21 NOTE — Progress Notes (Signed)
2 Days Post-Op Procedure(s) (LRB): VIDEO BRONCHOSCOPY WITH FLUORO (N/A) LEFT VIDEO ASSISTED THORACOSCOPY WITH LEFT UPPER AND LOWER LUNG BIOPSY (Left) Subjective: Feels better today  Objective: Vital signs in last 24 hours: Temp:  [98.2 F (36.8 C)-100.4 F (38 C)] 98.2 F (36.8 C) (09/28 0400) Pulse Rate:  [57-69] 57 (09/28 0700) Cardiac Rhythm:  [-] Normal sinus rhythm (09/27 2045) Resp:  [0-17] 7 (09/28 0700) BP: (96-141)/(59-81) 120/65 mmHg (09/28 0700) SpO2:  [87 %-95 %] 95 % (09/28 0700)  Hemodynamic parameters for last 24 hours:    Intake/Output from previous day: 09/27 0701 - 09/28 0700 In: 33.1 [I.V.:33.1] Out: 2525 [Urine:2275; Chest Tube:250] Intake/Output this shift:    General appearance: alert, cooperative and no distress Neurologic: intact Heart: regular rate and rhythm Lungs: diminished breath sounds bibasilar serous drainage from CT  Lab Results:  Recent Labs  07/20/15 0320 07/21/15 0457  WBC 8.9 12.9*  HGB 12.4* 12.1*  HCT 37.0* 36.6*  PLT 152 140*   BMET:  Recent Labs  07/20/15 0320 07/21/15 0457  NA 136 136  K 4.5 4.3  CL 104 102  CO2 24 27  GLUCOSE 135* 248*  BUN 19 21*  CREATININE 1.44* 1.52*  CALCIUM 8.4* 8.7*    PT/INR: No results for input(s): LABPROT, INR in the last 72 hours. ABG    Component Value Date/Time   PHART 7.355 07/20/2015 0332   HCO3 25.2* 07/20/2015 0332   TCO2 26.6 07/20/2015 0332   ACIDBASEDEF 0.4 07/15/2015 1026   O2SAT 93.1 07/20/2015 0332   CBG (last 3)   Recent Labs  07/20/15 1157 07/20/15 1628 07/20/15 2101  GLUCAP 177* 216* 243*    Assessment/Plan: S/P Procedure(s) (LRB): VIDEO BRONCHOSCOPY WITH FLUORO (N/A) LEFT VIDEO ASSISTED THORACOSCOPY WITH LEFT UPPER AND LOWER LUNG BIOPSY (Left) -  ILD- s/p lung biopsy  No air leak, chest tube output tapering off- will dc CT  Pain control- PCA + onQ  ENDO- CBG poorly controlled  On lantus + tradjenta + SSI  Resume meal coverage with novolog  and restart metformin  SCD + enoxaparin for DVT prophylaxis  CKD- creatinine relatively stable  Ambulate   LOS: 2 days    [name] 07/21/2015

## 2015-07-21 NOTE — Discharge Summary (Signed)
Physician Discharge Summary  Patient ID: Jerry Montgomery MRN: [id number] DOB/AGE: 1956/03/26 59 y.o.  Admit date: 07/19/2015 Discharge date: 07/21/2015  Admission Diagnoses: Interstitial lung disease  Discharge Diagnoses: Usual interstitial pneumonia Active Problems:   ILD (interstitial lung disease)  Patient Active Problem List   Diagnosis Date Noted  . Pulmonary fibrosis   . 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 Chronic respiratory failure with hypoxia 03/09/2015  . ILD (interstitial lung disease) 03/09/2015  . Acute renal insufficiency 03/09/2015  . Chronic renal insufficiency 03/09/2015  . Other emphysema 03/09/2015  . Diabetes mellitus type 2 without retinopathy 02/05/2015  . Acute respiratory failure   . Atrial fibrillation with RVR   . Acute renal failure syndrome   . Acute pulmonary edema   . Acute heart failure   . Acute diastolic heart failure   . Acute respiratory failure with hypoxia   . Atrial tachycardia   . Hypokalemia   . IPF (idiopathic pulmonary fibrosis)   . SVT (supraventricular tachycardia) 02/02/2015  . Chest wall pain 06/17/2013  . Chondral lesion 01/22/2013  . Cough 03/22/2012  . Pneumonia, organism unspecified 03/07/2012  . DIABETES MELLITUS, TYPE II 02/23/2010  . COPD II 02/23/2010  . HYPERLIPIDEMIA 02/22/2010  . HYPERTENSION 02/22/2010  . CAD 02/22/2010  . BRONCHIECTASIS 02/22/2010  . PULMONARY FIBROSIS 02/22/2010    History of the present illness: At time of consultation  He is a 59 year old man with past history of tobacco abuse who presents with chief complaint of shortness of breath with exertion. His past medical history is significant for right spontaneous pneumothorax, status post right video-assisted thoracoscopy, pulmonary fibrosis, coronary artery disease, Wolff-Parkinson-White with atrial tachycardia, status post ablation, sleep apnea, type 2 diabetes, hypertension, hyperlipidemia, pneumonia, acute diastolic heart failure, COPD, and chronic  kidney disease.  He had a recurrent right spontaneous pneumothorax back in 2011. I placed a chest tube in him at that time. Dr. Edwyna Shell did a right VATS and blebectomy and pleurectomy during that admission.  Over the past several years he's been having issues with shortness of breath and fatigue. He was diagnosed with pulmonary fibrosis. Recently he has had progression on his chest x-ray and has evidence of interstitial lung disease. This is of indeterminate etiology by CT scan. He now needs a lung biopsy to define more clearly what the underlying process is, so that appropriate treatment can be instituted.  He has limited his physical activity. He does wear home oxygen at 3 L nasal cannula. He complains of fatigue. He's not had any significant weight loss. He has sleep apnea but does not use CPAP. He has a history of coronary disease but has not had any anginal pain recently. He also had an ablation for Wolff-Parkinson-White syndrome. A right heart catheterization at the time of his ablation showed normal pulmonary artery pressures and preserve cardiac index. He has held his Plavix for surgical procedures without issue previously.   Discharged Condition: good  Hospital Course: The patient was admitted electively for the procedure. On 09/262016 he was taken to the operating room at which time he underwent the following procedure:  DATE OF PROCEDURE: 07/19/2015 DATE OF DISCHARGE:   OPERATIVE REPORT   PREOPERATIVE DIAGNOSIS: Interstitial lung disease.  POSTOPERATIVE DIAGNOSIS: Interstitial lung disease.  PROCEDURE: 1. Video bronchoscopy, with bronchoalveolar lavage and biopsies. 2. Left video-assisted thoracoscopy, lung biopsy (upper and lower lobe  x2).  SURGEON: Salvatore Decent. Dorris Fetch, M.D.  ASSISTANT: Albertine Patricia, RNFA.  ANESTHESIA: General.  FINDINGS: Bronchoscopy revealed normal endobronchial anatomy, with  no endobronchial  lesions. Mild thick white secretions. The lung had a nodular texture, frozen section of initial biopsy showed interstitial lung disease, remaining biopsies sent for permanent section only.  He tolerated the procedure well and was taken to the post anesthesia care unit in good condition  Post operative Hospital course:  The patient has overall done well. 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 The patient has had adequate pain control with PCA and on acute device with transition to oral pain medications. His blood sugars have been somewhat poorly controlled but are stabilizing and transitioning to his home regimen. The chest tube has been managed in a routine manner with serial chest x-rays and was discontinued on postoperative day #2. Incisions are noted healing well without evidence of infection. He is tolerating gradually increasing activities using standard postoperative rehabilitation protocols. Oxygen is being weaned as tolerated but he is known to use home oxygen. His overall status is felt to be tentatively stable for discharge in the next 24-48 hours pending ongoing reevaluation of his recovery. He does desta at night and will be asked to wear his O2 at 6 liters during sleep as he apparently refused CPAP for his OSA   Consults: pulmonology  Significant Diagnostic Studies: radiology: serial CXR post-op  Treatments: surgery: as above   Discharge Exam: Blood pressure 99/62, pulse 59, temperature 97.4 F (36.3 C), temperature source Oral, resp. rate 17, height  (1.854 m), weight 262 lb (118.842 kg), SpO2 90 %.  General appearance: alert, cooperative and no distress Heart: regular rate and rhythm Lungs: coarse BS in upper airways , otherwise clear, no wheeze Abdomen: benign Extremities: no edema Wound: incis healing well  Disposition: 01-Home or Self Care  Medications at time of discharge:   Medication List    STOP taking  these medications        ciprofloxacin 500 MG tablet  Commonly known as:  CIPRO     zolpidem 10 MG tablet  Commonly known as:  AMBIEN      TAKE these medications        ACCU-CHEK SOFTCLIX LANCETS lancets  Check blood sugar twice daily     ADVAIR HFA 115-21 MCG/ACT inhaler  Generic drug:  fluticasone-salmeterol  Inhale 2 puffs into the lungs 2 (two) times daily.     albuterol 108 (90 BASE) MCG/ACT inhaler  Commonly known as:  PROVENTIL HFA;VENTOLIN HFA  Inhale 2 puffs into the lungs every 6 (six) hours as needed for wheezing.     ALPRAZolam 0.5 MG tablet  Commonly known as:  XANAX  Take 1 tablet (0.5 mg total) by mouth 3 (three) times daily as needed for anxiety.     aspirin 81 MG tablet  Take 81 mg by mouth daily.     atorvastatin 40 MG tablet  Commonly known as:  LIPITOR  Take 1 tablet by mouth  every night at bedtime     B-D ULTRAFINE III SHORT PEN 31G X 8 MM Misc  Generic drug:  Insulin Pen Needle  Use as directed     BIOFREEZE EX  Apply 1 application topically as needed (for pain).     clopidogrel 75 MG tablet  Commonly known as:  PLAVIX  Take 1 tablet by mouth  every day     diltiazem 30 MG tablet  Commonly known as:  CARDIZEM  Take 1 tablet (30 mg total) by mouth every 6 (six) hours.  furosemide 20 MG tablet  Commonly known as:  LASIX  Take 1 tablet (20 mg total) by mouth daily as needed for fluid (fluid).     glucose blood test strip  Commonly known as:  ACCU-CHEK AVIVA PLUS  Check bs 4-5 times per day DX-250.00     Insulin Glargine 100 UNIT/ML Solostar Pen  Commonly known as:  LANTUS SOLOSTAR  Inject 60 Units into the skin at bedtime.     insulin lispro 100 UNIT/ML KiwkPen  Commonly known as:  HUMALOG  Inject 10units into the skin with breakfast and lunch, and inject 20units into the skin with dinner.     isosorbide mononitrate 60 MG 24 hr tablet  Commonly known as:  IMDUR  Take 1 and 1/2 tablets by  mouth every day     levothyroxine 125  MCG tablet  Commonly known as:  SYNTHROID, LEVOTHROID  Take 1 tablet (125 mcg total) by mouth daily.     linagliptin 5 MG Tabs tablet  Commonly known as:  TRADJENTA  Take 1 tablet (5 mg total) by mouth daily.     losartan 100 MG tablet  Commonly known as:  COZAAR  Take 1 tablet (100 mg total) by mouth daily.     metFORMIN 850 MG tablet  Commonly known as:  GLUCOPHAGE  Take 1 tablet by mouth  twice a day     metoprolol 200 MG 24 hr tablet  Commonly known as:  TOPROL XL  Take 1 tablet (200 mg total) by mouth daily.     nitroGLYCERIN 0.4 MG SL tablet  Commonly known as:  NITROSTAT  Place 1 tablet (0.4 mg total) under the tongue every 5 (five) minutes as needed for chest pain.     oxyCODONE 5 MG immediate release tablet  Commonly known as:  Oxy IR/ROXICODONE  Take 1 tablet (5 mg total) by mouth every 6 (six) hours as needed (pain).     predniSONE 10 MG tablet  Commonly known as:  DELTASONE  Take 60 mg daily for 3 days, followed by;  50 mg daily for 4 days, followed by: 40 mg daily for 4 days, followed by; 30 mg daily for 3 days, followed by; 20 mg daily for 2 days, followed by; 10 mg daily for 2 days, followed by; 5 mg daily for 2 days then stop  Start taking on:  07/27/2015     SYRINGE/NEEDLE (DISP) 1 ML 25G X 5/8" 1 ML Misc  Commonly known as:  B-D SYRINGE/NEEDLE 1CC/25GX5/8  As directed     tiotropium 18 MCG inhalation capsule  Commonly known as:  SPIRIVA  Place 18 mcg into inhaler and inhale daily.        Follow-up Information    Follow up with [name], MD.   Specialty:  Cardiothoracic Surgery   Why:  08/03/2015 at 2:45 PM to see the surgeon. Please obtain a chest x-ray [facility] at 2:15 PM. [facility] is located in the same office complex.   Contact information:   [address] [employer] [address] [place] [address] [phone]       Follow up with [facility], MD On 08/25/2015.   Specialty:  Pulmonary Disease   Why:  Appt at  3:30 PM    Contact information:   [address] [place] [address] [phone]       Follow up with PARRETT,TAMMY, NP On 08/03/2015.   Specialty:  Nurse Practitioner   Why:  Appt at 11:30 am  Contact information:   [address] [place] [address] [phone]       Signed: Rowe Clack 07/21/2015, 12:57 PM

## 2015-07-22 ENCOUNTER — Inpatient Hospital Stay (HOSPITAL_COMMUNITY): Payer: Medicare Other

## 2015-07-22 ENCOUNTER — Telehealth: Payer: Self-pay | Admitting: Internal Medicine

## 2015-07-22 ENCOUNTER — Encounter (HOSPITAL_COMMUNITY): Payer: Self-pay

## 2015-07-22 DIAGNOSIS — J939 Pneumothorax, unspecified: Secondary | ICD-10-CM

## 2015-07-22 DIAGNOSIS — J441 Chronic obstructive pulmonary disease with (acute) exacerbation: Secondary | ICD-10-CM

## 2015-07-22 DIAGNOSIS — R0902 Hypoxemia: Secondary | ICD-10-CM

## 2015-07-22 DIAGNOSIS — J849 Interstitial pulmonary disease, unspecified: Secondary | ICD-10-CM

## 2015-07-22 LAB — BLOOD GAS, ARTERIAL
ACID-BASE EXCESS: 0.5 mmol/L (ref 0.0–2.0)
BICARBONATE: 23.9 meq/L (ref 20.0–24.0)
DRAWN BY: 365291
O2 Content: 6 L/min
O2 SAT: 95.6 %
PATIENT TEMPERATURE: 98.6
PO2 ART: 72.6 mmHg — AB (ref 80.0–100.0)
TCO2: 24.9 mmol/L (ref 0–100)
pCO2 arterial: 33.4 mmHg — ABNORMAL LOW (ref 35.0–45.0)
pH, Arterial: 7.467 — ABNORMAL HIGH (ref 7.350–7.450)

## 2015-07-22 LAB — TISSUE CULTURE
Culture: NO GROWTH
Gram Stain: NONE SEEN

## 2015-07-22 LAB — BASIC METABOLIC PANEL
Anion gap: 10 (ref 5–15)
BUN: 19 mg/dL (ref 6–20)
CALCIUM: 9 mg/dL (ref 8.9–10.3)
CO2: 26 mmol/L (ref 22–32)
CREATININE: 1.33 mg/dL — AB (ref 0.61–1.24)
Chloride: 102 mmol/L (ref 101–111)
GFR, EST NON AFRICAN AMERICAN: 56 mL/min — AB (ref >60)
Glucose, Bld: 189 mg/dL — ABNORMAL HIGH (ref 65–99)
Potassium: 3.9 mmol/L (ref 3.5–5.1)
SODIUM: 138 mmol/L (ref 135–145)

## 2015-07-22 LAB — CBC
HEMATOCRIT: 36.4 % — AB (ref 39.0–52.0)
Hemoglobin: 12.1 g/dL — ABNORMAL LOW (ref 13.0–17.0)
MCH: 30 pg (ref 26.0–34.0)
MCHC: 33.2 g/dL (ref 30.0–36.0)
MCV: 90.1 fL (ref 78.0–100.0)
PLATELETS: 148 10*3/uL — AB (ref 150–400)
RBC: 4.04 MIL/uL — ABNORMAL LOW (ref 4.22–5.81)
RDW: 13.2 % (ref 11.5–15.5)
WBC: 11.3 10*3/uL — AB (ref 4.0–10.5)

## 2015-07-22 LAB — GLUCOSE, CAPILLARY
GLUCOSE-CAPILLARY: 194 mg/dL — AB (ref 65–99)
Glucose-Capillary: 185 mg/dL — ABNORMAL HIGH (ref 65–99)
Glucose-Capillary: 246 mg/dL — ABNORMAL HIGH (ref 65–99)
Glucose-Capillary: 138 mg/dL — ABNORMAL HIGH (ref 65–99)

## 2015-07-22 MED ORDER — OXYCODONE HCL 5 MG PO TABS
5.0000 mg | ORAL_TABLET | ORAL | Status: DC | PRN
  Administered 2015-07-22: 5 mg via ORAL
  Filled 2015-07-22 (×2): qty 1

## 2015-07-22 MED ORDER — ALPRAZOLAM 0.5 MG PO TABS
0.5000 mg | ORAL_TABLET | Freq: Three times a day (TID) | ORAL | Status: DC | PRN
  Administered 2015-07-22: 0.5 mg via ORAL
  Filled 2015-07-22: qty 1

## 2015-07-22 NOTE — Consult Note (Signed)
Name: Jerry Montgomery MRN: [id number] DOB: 18-Jun-1956    ADMISSION DATE:  07/19/2015 CONSULTATION DATE:  07/22/15  REFERRING MD :  Dr. Dorris Fetch   CHIEF COMPLAINT:  S/P VATS Biopsy   BRIEF PATIENT DESCRIPTION:  59 y/o M, former tobacco abuse, with PMH of suspected pulmonary fibrosis, R spontaneous PTX, prior VATS for blebectomy/pleurectomy (2011) CAD, WPW w Atrial Tachycardia s/p ablation, OSA, DM II, HTN, and CKD admitted 9/26 for planned lung biopsy.    SIGNIFICANT EVENTS  9/27 VATS  STUDIES:  9/28 pathology report  HISTORY OF PRESENT ILLNESS:  59 y/o M, former tobacco abuse, with PMH of suspected pulmonary fibrosis, R spontaneous PTX, prior VATS for blebectomy/pleurectomy (2011) CAD, WPW w Atrial Tachycardia s/p ablation, OSA not CPAP, DM II, HTN, and CKD admitted 9/26 for planned lung biopsy.   The patient is followed by Dr. [name] for chronic shortness of breath, fatigue and uncharacteristic findings on CT for fibrosis.  He has also been seen at [facility].  He wears O2 at 3L chronically.  ANA autoimmune panel was negative.  Prior note reflects he was given a three month trial of Pirfenidone but he currently states he never took the medication.    The patient underwent a left VATS on 9/26 per Dr. Dorris Fetch.  Chest tube was removed on 9/28 without complication.  Pathology returned was consistent with usual interstitial pneumonitis.  He remains on his baseline of 3L.   PCCM consulted 9/29 for evaluation.    PAST MEDICAL HISTORY :   has a past medical history of Hypertension; Hyperlipidemia; Postinflammatory pulmonary fibrosis; CAD (coronary artery disease); Pneumothorax on right (4/11); COPD (chronic obstructive pulmonary disease); Atrial tachycardia; Acute diastolic heart failure; On home oxygen therapy; Sleep apnea; Hypothyroidism; Type II diabetes mellitus; WPW (Wolff-Parkinson-White syndrome); SVT (supraventricular tachycardia); and Atrial fibrillation.  has past surgical  history that includes Shoulder arthroscopy w/ rotator cuff repair (Bilateral); Bilateral VATS ablation (Right); Cardiac catheterization (05/03/2007); Knee surgery (2012); Knee arthroscopy (Right, 01/22/2013); Cardiac catheterization (N/A, 04/14/2015); Cardiac catheterization (N/A, 05/18/2015); Coronary angioplasty with stent (03/05/2003; 2008; 01/13/2010); Lung lobectomy (Right); Video bronchoscopy (N/A, 07/19/2015); and Video assisted thoracoscopy (Left, 07/19/2015).   Prior to Admission medications   Medication Sig Start Date End Date Taking? Authorizing Provider  ACCU-CHEK SOFTCLIX LANCETS lancets Check blood sugar twice daily 07/08/15  Yes Donita Brooks, MD  ADVAIR [facility] 115-21 MCG/ACT inhaler Inhale 2 puffs into the lungs 2 (two) times daily. 08/03/13  Yes Historical Provider, MD  albuterol (PROVENTIL HFA;VENTOLIN HFA) 108 (90 BASE) MCG/ACT inhaler Inhale 2 puffs into the lungs every 6 (six) hours as needed for wheezing. 06/05/13  Yes Donita Brooks, MD  ALPRAZolam Prudy Feeler) 0.5 MG tablet Take 1 tablet (0.5 mg total) by mouth 3 (three) times daily as needed for anxiety. 06/21/15  Yes Donita Brooks, MD  aspirin 81 MG tablet Take 81 mg by mouth daily.   Yes Historical Provider, MD  atorvastatin (LIPITOR) 40 MG tablet Take 1 tablet by mouth  every night at bedtime 06/22/15  Yes Donita Brooks, MD  B-D ULTRAFINE III SHORT PEN 31G X 8 MM MISC Use as directed   Yes Donita Brooks, MD  ciprofloxacin (CIPRO) 500 MG tablet Take 1 tablet (500 mg total) by mouth 2 (two) times daily. 07/15/15  Yes [name], MD  clopidogrel (PLAVIX) 75 MG tablet Take 1 tablet by mouth  every day 06/21/15  Yes Donita Brooks, MD  diltiazem (CARDIZEM) 30 MG tablet Take  1 tablet (30 mg total) by mouth every 6 (six) hours. 03/11/15  Yes [name], MD  furosemide (LASIX) 20 MG tablet Take 1 tablet (20 mg total) by mouth daily as needed for fluid (fluid). 03/18/15  Yes Donita Brooks, MD  glucose blood (ACCU-CHEK AVIVA  PLUS) test strip Check bs 4-5 times per day DX-250.00 Patient taking differently: 1 each by Other route See admin instructions. Check blood sugar 2 times daily   Yes Donita Brooks, MD  Insulin Glargine (LANTUS SOLOSTAR) 100 UNIT/ML Solostar Pen Inject 60 Units into the skin at bedtime.   Yes Historical Provider, MD  insulin lispro (HUMALOG) 100 UNIT/ML KiwkPen Inject 10units into the skin with breakfast and lunch, and inject 20units into the skin with dinner. Patient taking differently: Inject 10-30 Units into the skin 3 (three) times daily. Inject 10units into the skin with breakfast and 10units with lunch, and inject 20 or 30units into the skin with dinner depending on A1C level. 01/15/15  Yes Donita Brooks, MD  isosorbide mononitrate (IMDUR) 60 MG 24 hr tablet Take 1 and 1/2 tablets by  mouth every day Patient taking differently: Take 60 mg by  mouth every day 07/03/14  Yes Donita Brooks, MD  levothyroxine (SYNTHROID, LEVOTHROID) 125 MCG tablet Take 1 tablet (125 mcg total) by mouth daily. 01/12/15  Yes Donita Brooks, MD  linagliptin (TRADJENTA) 5 MG TABS tablet Take 1 tablet (5 mg total) by mouth daily. 04/29/15  Yes Donita Brooks, MD  losartan (COZAAR) 100 MG tablet Take 1 tablet (100 mg total) by mouth daily. 04/29/15  Yes Donita Brooks, MD  metFORMIN (GLUCOPHAGE) 850 MG tablet Take 1 tablet by mouth  twice a day 06/29/15  Yes Donita Brooks, MD  metoprolol (TOPROL XL) 200 MG 24 hr tablet Take 1 tablet (200 mg total) by mouth daily. 06/21/15  Yes Donita Brooks, MD  SYRINGE/NEEDLE, DISP, 1 ML (B-D SYRINGE/NEEDLE 1CC/25GX5/8) 25G X 5/8" 1 ML MISC As directed 11/10/13  Yes Donita Brooks, MD  tiotropium (SPIRIVA) 18 MCG inhalation capsule Place 18 mcg into inhaler and inhale daily.  07/29/14  Yes Historical Provider, MD  zolpidem (AMBIEN) 10 MG tablet Take 1 tablet (10 mg total) by mouth at bedtime as needed for sleep. 06/21/15  Yes Donita Brooks, MD  Menthol, Topical Analgesic,  (BIOFREEZE EX) Apply 1 application topically as needed (for pain).    Historical Provider, MD  nitroGLYCERIN (NITROSTAT) 0.4 MG SL tablet Place 1 tablet (0.4 mg total) under the tongue every 5 (five) minutes as needed for chest pain. 03/11/15   [name], MD   Allergies  Allergen Reactions  . Niaspan [Niacin] Itching  . Altace [Ramipril] Other (See Comments)    Cough     FAMILY HISTORY:  family history includes Breast cancer in his mother; Diabetes type II in his mother; Lung cancer in his father.   SOCIAL HISTORY:  reports that he quit smoking about 7 years ago. His smoking use included Cigarettes. He has a 52.5 pack-year smoking history. He has never used smokeless tobacco. He reports that he drinks alcohol. He reports that he does not use illicit drugs.  REVIEW OF SYSTEMS:   Constitutional: Negative for fever, chills, weight loss, malaise and diaphoresis. Reports fatigue HENT: Negative for hearing loss, ear pain, nosebleeds, congestion, sore throat, neck pain, tinnitus and ear discharge.   Eyes: Negative for blurred vision, double vision, photophobia, pain, discharge and redness.  Respiratory:  Negative for cough, hemoptysis, sputum production, wheezing and stridor.  Reports shortness of breath at rest and with exertion Cardiovascular: Negative for chest pain, palpitations, orthopnea, claudication, leg swelling and PND.  Gastrointestinal: Negative for heartburn, nausea, vomiting, abdominal pain, diarrhea, constipation, blood in stool and melena.  Genitourinary: Negative for dysuria, urgency, frequency, hematuria and flank pain.  Musculoskeletal: Negative for myalgias, back pain, joint pain and falls.  Skin: Negative for itching and rash.  Neurological: Negative for dizziness, tingling, tremors, sensory change, speech change, focal weakness, seizures, loss of consciousness, weakness and headaches.  Endo/Heme/Allergies: Negative for environmental allergies and polydipsia. Does not  bruise/bleed easily.  SUBJECTIVE: Pt denies acute complaints, remains on baseline O2   VITAL SIGNS: Temp:  [97.4 F (36.3 C)-98.4 F (36.9 C)] 97.8 F (36.6 C) (09/29 1121) Pulse Rate:  [64-89] 69 (09/29 1121) Resp:  [13-25] 25 (09/29 1121) BP: (111-143)/(38-78) 126/73 mmHg (09/29 1230) SpO2:  [89 %-98 %] 98 % (09/29 1121)  PHYSICAL EXAMINATION: General:  Chronically ill adult male in NAD Neuro:  AAOx4, speech clear, MAE HEENT:  MM pink/moist, no jvd Cardiovascular:  s1s2 rrr, no m/r/g  Lungs:  resp's even/non-labored, lungs bilaterally coarse, 3L O2 (baseline) Abdomen:  Obese, NDNT, BSx4 active  Musculoskeletal:  No acute deformities  Skin:  Warm/dry, no edema    Recent Labs Lab 07/20/15 0320 07/21/15 0457 07/22/15 1214  NA 136 136 138  K 4.5 4.3 3.9  CL 104 102 102  CO2 BUN 19 21* 19  CREATININE 1.44* 1.52* 1.33*  GLUCOSE 135* 248* 189*    Recent Labs Lab 07/20/15 0320 07/21/15 0457 07/22/15 1214  HGB 12.4* 12.1* 12.1*  HCT 37.0* 36.6* 36.4*  WBC 8.9 12.9* 11.3*  PLT 152 140* 148*   Dg Chest 2 View  07/22/2015   CLINICAL DATA:  Shortness of breath, weakness, history of interstitial lung disease and cardiac stent placement, status post thoracoscopy on September 26  EXAM: CHEST  2 VIEW  COMPARISON:  Portable chest x-ray of July 21, 2015  FINDINGS: The lungs are adequately inflated. The interstitial markings are diffusely increased. There is no pneumothorax. There is pleural thickening laterally on the left. The cardiac silhouette is enlarged. The pulmonary vascularity is engorged and indistinct. The left internal jugular venous catheter tip projects over the proximal SVC. Small pleural effusions layering posteriorly are suspected.  IMPRESSION: 1. No evidence of left-sided pneumothorax. 2. Persistent diffuse airspace disease bilaterally. 3. Stable enlargement the cardiac silhouette with mild central pulmonary vascular congestion.   Electronically Signed    By: David  [place] M.D.   On: 07/22/2015 07:43   Dg Chest Port 1 View  07/21/2015   CLINICAL DATA:  Removal of left-sided chest tube.  EXAM: PORTABLE CHEST 1 VIEW  COMPARISON:  Earlier film, same date.  FINDINGS: The left-sided chest tube has been removed. No definite pneumothorax. The left IJ catheter is stable. Persistent cardiac enlargement and diffuse airspace process.  IMPRESSION: Removal of left-sided chest tube without definite pneumothorax.  Stable cardiac enlargement diffuse airspace process.   Electronically Signed   By: Rudie Meyer M.D.   On: 07/21/2015 11:45   Dg Chest Port 1 View  07/21/2015   CLINICAL DATA:  Interstitial lung disease  EXAM: PORTABLE CHEST 1 VIEW  COMPARISON:  07/20/2015  FINDINGS: Left Central line tip in the upper SVC, stable. Cardiomegaly with bilateral airspace disease and layering effusions compatible with edema/ CHF. Left chest tube is in place. No pneumothorax.  IMPRESSION:  No change in bilateral airspace disease and layering effusions, likely CHF.  Left chest tube without pneumothorax.   Electronically Signed   By: Charlett Nose M.D.   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  Appt scheduled but under review to get in sooner.  Office will contact the patient.   Will initiate Ofev vs Pirfenidone  Post operative care per CVTS   Chronic Hypoxic Respiratory Failure - baseline 3L O2 dependent   Plan: Continue O2 at 3L per [place] Pul[facility] hygiene: IS, mobilize Minimize sedation as able   COPD   Plan: Continue Spiriva  PRN albuterol    Pleural Effusions  Plan: Lasix 40 mg QD Intermittent CXR   OSA - not on CPAP   Plan: Follow up as an outpatient   Canary Brim, NP-C [facility] Pgr: [phone] or if no answer [phone] 07/22/2015, 2:28 PM  Attending Note:   59 year old  male with IPF underwent a VATS with biopsy of LUL and LLL. Biopsy results were UIP. Patient reports he has not been on treatment for his IPF as outpatient. F/U with Dr. [name] and was referred to CVTS by Dr. [name]. Patient usually on 3L [place] at home and currently is on 3L. Patient also has history of COPD and is on spiriva. On exam patient has diffusely coarse BS diffusely. I reviewed pathology report myself, UIP noted. Discussed with PCCM-NP and TRH-MD.   UIP:   - Prednisone 60 mg PO daily with a 3 week taper.   - F/U with Dr. [name] as outpatient.   - Will likely start Ofev vs Pirfenidone as outpatient.   Hypoxemia:   - Titrate O2 for sat of 88-92%.   COPD:   - Spiriva.   - PRN albuterol.   PTX: post op.   - No PTX.   - Remove CT.   Patient seen and examined, agree with above note. I dictated the care and orders written for this patient under my direction.  Alyson Reedy, MD  [phone]

## 2015-07-22 NOTE — Progress Notes (Signed)
Dilaudid PCA discontinued. 3.6 mg wasted in sink. Witnessed by H. Arvilla Market, RN.

## 2015-07-22 NOTE — Care Management Important Message (Signed)
Important Message  Patient Details  Name: Jerry Montgomery MRN: [id number] Date of Birth: 1956/02/16   Medicare Important Message Given:  Yes-second notification given    [name] 07/22/2015, 12:20 PM

## 2015-07-22 NOTE — Telephone Encounter (Signed)
Jerry Montgomery  Jerry Montgomery has UIP/IPF on bx. I need to discuss Rx ofev/esbriet . Can you get him sooner than November - 15 min slot. How soon - nex 2 weeks ideal can put him in a 13.30 slot  Thanks Dr. Kalman Shan, M.D., [facility].C.P Pulmonary and Critical Care Medicine Staff Physician  System [place] [facility] Pager: [phone], If no answer or between  15:00h - 7:00h: call [phone]  07/22/2015 3:20 PM

## 2015-07-22 NOTE — Evaluation (Signed)
Physical Therapy Evaluation Patient Details Name: Jerry Montgomery MRN: [id number] DOB: 12/20/1955 Today's Date: 07/22/2015   History of Present Illness  59 year old man with past history of tobacco abuse who presents with chief complaint of shortness of breath with exertion. His past medical history is significant for right spontaneous pneumothorax, status post right video-assisted thoracoscopy, pulmonary fibrosis, coronary artery disease, Wolff-Parkinson-White with atrial tachycardia, status post ablation, sleep apnea, type 2 diabetes, hypertension, hyperlipidemia, pneumonia, acute diastolic heart failure, COPD, and chronic kidney disease..Pt s/p VATSand bronchoscopy 9/27, fall 9/29  Clinical Impression  Pt pleasant but slightly confused regarding his decreased function and could not believe he fell in bathroom earlier today. Pt with unsteady gait with cues need for safety and stability , bil LE strength WFL and 5/5 all myotomes but pt coordination with transfers and mobility is impaired. Pt will benefit from acute therapy to maximize function, gait and balance to return pt to independent level. Pt with sats dropping to 87% on 4L and required seated recovery on 5L to return to 95%.     Follow Up Recommendations Home health PT;Supervision for mobility/OOB    Equipment Recommendations  None recommended by PT    Recommendations for Other Services OT consult     Precautions / Restrictions Precautions Precautions: Fall Precaution Comments: watch sats      Mobility  Bed Mobility Overal bed mobility: Needs Assistance Bed Mobility: Supine to Sit     Supine to sit: Min assist     General bed mobility comments: cues for hand placement on rail to roll with assist of pad to rotate trunk and elevate trunk with increased time to complete transfers  Transfers Overall transfer level: Needs assistance   Transfers: Sit to/from Stand Sit to Stand: Min guard         General transfer comment:  cues for hand placement and sequence, pt sitting prematurely at EOB  Ambulation/Gait Ambulation/Gait assistance: Min guard Ambulation Distance (Feet): 150 Feet Assistive device: Rolling walker (2 wheeled) Gait Pattern/deviations: Step-through pattern;Decreased stride length;Trunk flexed   Gait velocity interpretation: Below normal speed for age/gender General Gait Details: cues for posture, looking up, position in RW and safety with assist to control speed  Stairs            Wheelchair Mobility    Modified Rankin (Stroke Patients Only)       Balance Overall balance assessment: Needs assistance   Sitting balance-Leahy Scale: Good       Standing balance-Leahy Scale: Fair                               Pertinent Vitals/Pain Pain Assessment: 0-10 Pain Score: 4  Pain Location: back bil  Pain Descriptors / Indicators: Aching Pain Intervention(s): Repositioned    Home Living Family/patient expects to be discharged to:: Private residence Living Arrangements: Non-relatives/Friends Available Help at Discharge: Family;Available 24 hours/day Type of Home: House Home Access: Level entry     Home Layout: One level Home Equipment: Walker - 2 wheels;Cane - single point;Crutches;Wheelchair - manual      Prior Function Level of Independence: Independent         Comments: wears 3L O2 at all times since May     Hand Dominance        Extremity/Trunk Assessment   Upper Extremity Assessment: Generalized weakness           Lower Extremity Assessment: Overall WFL for tasks assessed  Cervical / Trunk Assessment: Normal  Communication   Communication: No difficulties  Cognition Arousal/Alertness: Awake/alert Behavior During Therapy: WFL for tasks assessed/performed Overall Cognitive Status: Impaired/Different from baseline Area of Impairment: Safety/judgement         Safety/Judgement: Decreased awareness of deficits;Decreased awareness  of safety     General Comments: Pt oriented and following commands but difficulty processing sequence for transfers with continued cues for RW use    General Comments      Exercises        Assessment/Plan    PT Assessment Patient needs continued PT services  PT Diagnosis Difficulty walking;Altered mental status   PT Problem List Decreased activity tolerance;Decreased balance;Decreased knowledge of use of DME;Decreased safety awareness;Decreased mobility;Cardiopulmonary status limiting activity  PT Treatment Interventions Gait training;DME instruction;Functional mobility training;Therapeutic activities;Balance training;Patient/family education   PT Goals (Current goals can be found in the Care Plan section) Acute Rehab PT Goals Patient Stated Goal: return home PT Goal Formulation: With patient Time For Goal Achievement: 08/05/15 Potential to Achieve Goals: Good    Frequency Min 3X/week   Barriers to discharge Decreased caregiver support lives with ex-wife who is on disability but pt states she can help him- unclear of level of assist    Co-evaluation               End of Session Equipment Utilized During Treatment: Gait belt;Oxygen Activity Tolerance: Patient tolerated treatment well Patient left: in bed;with bed alarm set;with call bell/phone within reach Nurse Communication: Mobility status;Precautions         Time: 1610-9604 PT Time Calculation (min) (ACUTE ONLY): 20 min   Charges:   PT Evaluation $Initial PT Evaluation Tier I: 1 Procedure     PT G CodesDelorse Lek 07/22/2015, 2:39 PM  Delaney Meigs, PT [phone]

## 2015-07-22 NOTE — Progress Notes (Signed)
Was the fall witnessed: Staff did not witness fall, daughter in room and heard patient fall.  Patient condition before and after the fall: Neuro status unchanged. C/o elbow pain.  Patient's reaction to the fall: Per pt- he was embarrassed. Didn't realize how weak he was.   Name of the doctor that was notified including date and time: Gershon Crane, [place] with CTS at 1124.  Any interventions and vital signs: Patient assessed, assisted to bed via wheelchair, VS taken in bed.

## 2015-07-22 NOTE — Progress Notes (Addendum)
[address]       [place] [address]             [phone]      3 Days Post-Op Procedure(s) (LRB): VIDEO BRONCHOSCOPY WITH FLUORO (N/A) LEFT VIDEO ASSISTED THORACOSCOPY WITH LEFT UPPER AND LOWER LUNG BIOPSY (Left) Subjective: Feels SOB, non productive moist cough Currently on 6 liters  Objective: Vital signs in last 24 hours: Temp:  [97.4 F (36.3 C)-98.4 F (36.9 C)] 98 F (36.7 C) (09/29 0731) Pulse Rate:  [59-89] 69 (09/29 0731) Cardiac Rhythm:  [-] Normal sinus rhythm (09/28 2000) Resp:  [15-20] 15 (09/29 0731) BP: (99-143)/(38-78) 133/78 mmHg (09/29 0731) SpO2:  [89 %-95 %] 90 % (09/29 0731)  Hemodynamic parameters for last 24 hours:    Intake/Output from previous day: 09/28 0701 - 09/29 0700 In: -  Out: 1550 [Urine:1450; Chest Tube:100] Intake/Output this shift:    General appearance: alert, cooperative, distracted and no distress Heart: regular rate and rhythm Lungs: coarse BS Abdomen: benign Extremities: no eema Wound: incis healing well  Lab Results:  Recent Labs  07/20/15 0320 07/21/15 0457  WBC 8.9 12.9*  HGB 12.4* 12.1*  HCT 37.0* 36.6*  PLT 152 140*   BMET:  Recent Labs  07/20/15 0320 07/21/15 0457  NA 136 136  K 4.5 4.3  CL 104 102  CO2 24 27  GLUCOSE 135* 248*  BUN 19 21*  CREATININE 1.44* 1.52*  CALCIUM 8.4* 8.7*    PT/INR: No results for input(s): LABPROT, INR in the last 72 hours. ABG    Component Value Date/Time   PHART 7.355 07/20/2015 0332   HCO3 25.2* 07/20/2015 0332   TCO2 26.6 07/20/2015 0332   ACIDBASEDEF 0.4 07/15/2015 1026   O2SAT 93.1 07/20/2015 0332   CBG (last 3)   Recent Labs  07/21/15 1242 07/21/15 1621 07/21/15 2217  GLUCAP 292* 291* 180*    Meds Scheduled Meds: . acetaminophen  1,000 mg Oral 4 times per day   Or  . acetaminophen (TYLENOL) oral liquid 160 mg/5 mL  1,000 mg Oral 4 times per day  . aspirin  81 mg Oral Daily  . atorvastatin  40 mg Oral q1800  .  bisacodyl  10 mg Oral Daily  . ciprofloxacin  500 mg Oral BID  . clopidogrel  75 mg Oral Daily  . diltiazem  30 mg Oral 4 times per day  . enoxaparin (LOVENOX) injection  40 mg Subcutaneous Daily  . furosemide  40 mg Oral Daily  . HYDROmorphone PCA 0.3 mg/mL   Intravenous 6 times per day  . insulin aspart  0-20 Units Subcutaneous TID WC  . insulin aspart  10 Units Subcutaneous TID WC  . insulin glargine  60 Units Subcutaneous QHS  . isosorbide mononitrate  60 mg Oral Daily  . levothyroxine  125 mcg Oral QAC breakfast  . linagliptin  5 mg Oral Daily  . losartan  100 mg Oral Daily  . metFORMIN  850 mg Oral BID WC  . metoprolol  200 mg Oral Daily  . senna-docusate  1 tablet Oral QHS  . tiotropium  18 mcg Inhalation Daily   Continuous Infusions: . bupivacaine 0.5 % ON-Q pump SINGLE CATH 400 mL 400 mL (07/19/15 0942)  . bupivacaine ON-Q pain pump    . dextrose 5 % and 0.45 % NaCl with KCl 20 mEq/L 10 mL/hr at 07/20/15 0819   PRN Meds:.albuterol, ALPRAZolam, diphenhydrAMINE **OR** diphenhydrAMINE, meperidine (DEMEROL) injection, naloxone **  AND** sodium chloride, nitroGLYCERIN, ondansetron (ZOFRAN) IV, potassium chloride, promethazine, zolpidem  Xrays Dg Chest 2 View  07/22/2015   CLINICAL DATA:  Shortness of breath, weakness, history of interstitial lung disease and cardiac stent placement, status post thoracoscopy on September 26  EXAM: CHEST  2 VIEW  COMPARISON:  Portable chest x-ray of July 21, 2015  FINDINGS: The lungs are adequately inflated. The interstitial markings are diffusely increased. There is no pneumothorax. There is pleural thickening laterally on the left. The cardiac silhouette is enlarged. The pulmonary vascularity is engorged and indistinct. The left internal jugular venous catheter tip projects over the proximal SVC. Small pleural effusions layering posteriorly are suspected.  IMPRESSION: 1. No evidence of left-sided pneumothorax. 2. Persistent diffuse airspace disease  bilaterally. 3. Stable enlargement the cardiac silhouette with mild central pulmonary vascular congestion.   Electronically Signed   By: David  [place]aziland M.D.   On: 07/22/2015 07:43   Dg Chest Port 1 View  07/21/2015   CLINICAL DATA:  Removal of left-sided chest tube.  EXAM: PORTABLE CHEST 1 VIEW  COMPARISON:  Earlier film, same date.  FINDINGS: The left-sided chest tube has been removed. No definite pneumothorax. The left IJ catheter is stable. Persistent cardiac enlargement and diffuse airspace process.  IMPRESSION: Removal of left-sided chest tube without definite pneumothorax.  Stable cardiac enlargement diffuse airspace process.   Electronically Signed   By: Rudie Meyer M.D.   On: 07/21/2015 11:45   Dg Chest Port 1 View  07/21/2015   CLINICAL DATA:  Interstitial lung disease  EXAM: PORTABLE CHEST 1 VIEW  COMPARISON:  07/20/2015  FINDINGS: Left Central line tip in the upper SVC, stable. Cardiomegaly with bilateral airspace disease and layering effusions compatible with edema/ CHF. Left chest tube is in place. No pneumothorax.  IMPRESSION: No change in bilateral airspace disease and layering effusions, likely CHF.  Left chest tube without pneumothorax.   Electronically Signed   By: Charlett Nose M.D.   On: 07/21/2015 07:38    Assessment/Plan: S/P Procedure(s) (LRB): VIDEO BRONCHOSCOPY WITH FLUORO (N/A) LEFT VIDEO ASSISTED THORACOSCOPY WITH LEFT UPPER AND LOWER LUNG BIOPSY (Left)   1 stable 2 cont to wean O2- was on 3 lit at home, push pulm toilet/movilize 3 CXR stable 4 no new labs 5 home soon    LOS: 3 days    GOLD,WAYNE E 07/22/2015  Patient seen and examined He has been noted to be confused by daughter He slipped and fell in BR while getting up off of the commode, no LOC, no complaints of hurting anything He is A&O x 3 with no focal CN or motor deficits currently Will check ABG, CBC, BMET Dc central line CBG elevated but around his normal range  Viviann Spare C. Dorris Fetch, MD [facility] [phone]
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
## 2015-07-23 DIAGNOSIS — J9621 Acute and chronic respiratory failure with hypoxia: Secondary | ICD-10-CM

## 2015-07-23 LAB — GLUCOSE, CAPILLARY
GLUCOSE-CAPILLARY: 182 mg/dL — AB (ref 65–99)
Glucose-Capillary: 132 mg/dL — ABNORMAL HIGH (ref 65–99)

## 2015-07-23 MED ORDER — PREDNISONE 50 MG PO TABS: 50.0000 mg | ORAL_TABLET | Freq: Every day | ORAL | Status: DC

## 2015-07-23 MED ORDER — PREDNISONE 10 MG PO TABS: 10.0000 mg | ORAL_TABLET | Freq: Every day | ORAL | Status: DC

## 2015-07-23 MED ORDER — PREDNISONE 20 MG PO TABS: 20.0000 mg | ORAL_TABLET | Freq: Every day | ORAL | Status: DC

## 2015-07-23 MED ORDER — PREDNISONE 20 MG PO TABS: 40.0000 mg | ORAL_TABLET | Freq: Every day | ORAL | Status: DC

## 2015-07-23 MED ORDER — PREDNISONE 10 MG PO TABS: ORAL_TABLET | ORAL | Status: DC

## 2015-07-23 MED ORDER — PREDNISONE 20 MG PO TABS: 30.0000 mg | ORAL_TABLET | Freq: Every day | ORAL | Status: DC

## 2015-07-23 MED ORDER — OXYCODONE HCL 5 MG PO TABS: 5.0000 mg | ORAL_TABLET | Freq: Four times a day (QID) | ORAL | Status: DC | PRN

## 2015-07-23 MED ORDER — PREDNISONE 50 MG PO TABS
60.0000 mg | ORAL_TABLET | Freq: Every day | ORAL | Status: DC
  Administered 2015-07-23: 60 mg via ORAL
  Filled 2015-07-23 (×2): qty 1

## 2015-07-23 MED ORDER — INSULIN GLARGINE 100 UNIT/ML SOLOSTAR PEN
60.0000 [IU] | PEN_INJECTOR | Freq: Every day | SUBCUTANEOUS | Status: DC
Start: 2015-07-23 — End: 2016-03-07

## 2015-07-23 MED ORDER — PREDNISONE 5 MG PO TABS: 5.0000 mg | ORAL_TABLET | Freq: Every day | ORAL | Status: DC

## 2015-07-23 NOTE — Progress Notes (Signed)
Jerry Montgomery to be D/C'd Home per MD order.  Discussed with the patient and all questions fully answered.  VSS, Skin clean, dry and intact without evidence of skin break down, no evidence of skin tears noted. IV catheter discontinued intact. Site without signs and symptoms of complications. Dressing and pressure applied.  An After Visit Summary was printed and given to the patient. Patient received prescription.  D/c education completed with patient/family including follow up instructions, medication list, d/c activities limitations if indicated, with other d/c instructions as indicated by MD - patient able to verbalize understanding, all questions fully answered.   Patient instructed to return to ED, call 911, or call MD for any changes in condition.   Patient escorted via WC, and D/C home via private auto with daugther.  [facility] 07/23/2015 3:59 PM

## 2015-07-23 NOTE — Discharge Instructions (Signed)
Video-Assisted Thoracic Surgery Care After Refer to this sheet in the next few weeks. These instructions provide you with information on caring for yourself after your procedure. Your caregiver may also give you more specific instructions. Your procedure has been planned according to current medical practices, but problems sometimes occur. Call your caregiver if you have any problems or questions after your procedure. HOME CARE INSTRUCTIONS   Only take over-the-counter or prescription medications as directed.  Only take pain medications (narcotics) as directed.  Do not drive until your caregiver approves. Driving while taking narcotics or soon after surgery can be dangerous, so discuss the specific timing with your caregiver.  Avoid activities that use your chest muscles, such as lifting heavy objects, for at least 3-4 weeks.   Take deep breaths to expand the lungs and to protect against pneumonia.  Do breathing exercises as directed by your caregiver. If you were given an incentive spirometer to help with breathing, use it as directed.  You may resume a normal diet and activities when you feel you are able to or as directed.  Do not take a bath until your caregiver says it is OK. Use the shower instead.   Keep the bandage (dressing) covering the area where the chest tube was inserted (incision site) dry for 48 hours. After 48 hours, remove the dressing unless there is new drainage.  Remove dressings as directed by your caregiver.  Change dressings if necessary or as directed.  Keep all follow-up appointments. It is important for you to see your caregiver after surgery to discuss appropriate follow-up care and surveillance, if it is necessary. SEEK MEDICAL CARE:  You feel excessive or increasing pain at an incision site.  You notice bleeding, skin irritation, drainage, swelling, or redness at an incision site.  There is a bad smell coming from an incision or dressing.  It feels  like your heart is fluttering or beating rapidly.  Your pain medication does not relieve your pain. SEEK IMMEDIATE MEDICAL CARE IF:   You have a fever.   You have chest pain.  You have a rash.  You have shortness of breath.  You have trouble breathing.   You feel weak, lightheaded, dizzy, or faint.  MAKE SURE YOU:   Understand these instructions.   Will watch your condition.   Will get help right away if you are not doing well or get worse. Document Released: 02/03/2013 Document Reviewed: 02/03/2013 [facility] Patient Information 2015 [place], [place]. This information is not intended to replace advice given to you by your health care provider. Make sure you discuss any questions you have with your health care provider.   Weat oxygen at 6 liters at night and 3 liters during the day

## 2015-07-23 NOTE — Progress Notes (Signed)
MEDICATION RELATED CONSULT NOTE - INITIAL   Pharmacy Consult for prednisone taper over 3 weeks Indication: interstitial pneumonitis  Allergies  Allergen Reactions  . Niaspan [Niacin] Itching  . Altace [Ramipril] Other (See Comments)    Cough     Patient Measurements: Height:  (185.4 cm) Weight: 262 lb (118.842 kg) IBW/kg (Calculated) : 79.9  Vital Signs: Temp: 97.6 F (36.4 C) (09/30 0757) Temp Source: Oral (09/30 0757) BP: 123/73 mmHg (09/30 0757) Pulse Rate: 70 (09/30 0757) Intake/Output from previous day: 09/29 0701 - 09/30 0700 In: 960 [P.O.:960] Out: 1700 [Urine:1700] Intake/Output from this shift:    Labs:  Recent Labs  07/21/15 0457 07/22/15 1214  WBC 12.9* 11.3*  HGB 12.1* 12.1*  HCT 36.6* 36.4*  PLT 140* 148*  CREATININE 1.52* 1.33*  ALBUMIN 2.8*  --   PROT 5.9*  --   AST 73*  --   ALT 33  --   ALKPHOS 46  --   BILITOT 1.2  --    Estimated Creatinine Clearance: 78.8 mL/min (by C-G formula based on Cr of 1.33).  Medical History: Past Medical History  Diagnosis Date  . Hypertension   . Hyperlipidemia   . Postinflammatory pulmonary fibrosis   . CAD (coronary artery disease)   . Pneumothorax on right 4/11  . COPD (chronic obstructive pulmonary disease)   . Atrial tachycardia   . Acute diastolic heart failure   . On home oxygen therapy     "3L; 24/7" (04/15/2015)  . Sleep apnea     "won't use CPAP" (04/14/2105)  . Hypothyroidism   . Type II diabetes mellitus   . WPW (Wolff-Parkinson-White syndrome)     Per Dr. Lubertha Basque Note  . SVT (supraventricular tachycardia)     Per Dr. Lubertha Basque note  . Atrial fibrillation     Assessment: 59 y/o male with suspected pulmonary fibrosis admitted 9/26 for lung biopsy. 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 Pharmacy consulted to help with taper.  Plan:  - Prednisone 60 mg daily for 4 days, 50 mg  daily for 4 days, 40 mg daily for 4 days, 30 mg daily for 3 days, 20 mg daily for 2 days, 10 mg daily for 2 days, 5 mg daily for 2 days - All doses should be taken with food preferably in the morning to minimize insomnia - Consider discharge on H2 blocker while on steroid with aspirin, clopidogrel, and Cipro - Consider close monitoring of CBGs - Pharmacy signing off, please re-consult if needed  [facility], Magna.D., BCPS Clinical Pharmacist Pager: [phone] 07/23/2015 8:20 AM

## 2015-07-23 NOTE — Telephone Encounter (Signed)
Called and spoke to pt. Pt is unable to make appt on 10.11.2016.   MR please advise if you would like to DB on another day or keep November appt. Thanks.

## 2015-07-23 NOTE — Care Management Note (Signed)
Case Management Note  Patient Details  Name: [name] MRN: [id number] Date of Birth: 1956/10/10  Subjective/Objective:                 Admitted s/p LEFT VIDEO ASSISTED THORACOSCOPY WITH LEFT UPPER AND LOWER LUNG BIOPSY. Independent with ADL's. Lives alone.  Action/Plan: Return to home when medically stable. CM to f/u with d/c needs.  Expected Discharge Date:                  Expected Discharge Plan:  Home/Self Care  In-House Referral:     Discharge planning Services     Post Acute Care Choice:    Choice offered to:  Spouse  DME Arranged:  Oxygen (active with [employer]) DME Agency:  Other - Comment ([employer])  HH Arranged:    HH Agency:     Status of Service:  In process, will continue to follow  Medicare Important Message Given:  Yes-second notification given Date Medicare IM Given:    Medicare IM give by:    Date Additional Medicare IM Given:    Additional Medicare Important Message give by:     If discussed at Long Length of Stay Meetings, dates discussed:   Ewin Rehberg (WUJWJX)[phone] Crystal Huffines (Daughter)[phone]  Additional Comments: Pt plans to care for self @ d/c. States daughter and ex-wife will  assist with care if needed once d/c   Plan for dc today - Confirms does have oxygen at home, states will not wear a sleep apnea device.  Lives with his ex wife at his house and his dogs.  Has a r/w and canes at home which he does not use.  Denies any further needs.  Vangie Bicker, RN,BSN,CM [phone] 07/23/2015, 10:35 AM

## 2015-07-23 NOTE — Progress Notes (Addendum)
[address]       [place] [address]             [phone]      4 Days Post-Op Procedure(s) (LRB): VIDEO BRONCHOSCOPY WITH FLUORO (N/A) LEFT VIDEO ASSISTED THORACOSCOPY WITH LEFT UPPER AND LOWER LUNG BIOPSY (Left) Subjective: Feels better, currently O2 at 6l  Objective: Vital signs in last 24 hours: Temp:  [97.5 F (36.4 C)-98.8 F (37.1 C)] 98.8 F (37.1 C) (09/30 0400) Pulse Rate:  [58-70] 58 (09/30 0400) Cardiac Rhythm:  [-] Normal sinus rhythm (09/30 0400) Resp:  [13-26] 19 (09/30 0400) BP: (100-134)/(42-74) 128/74 mmHg (09/30 0001) SpO2:  [90 %-98 %] 90 % (09/30 0400)  Hemodynamic parameters for last 24 hours:    Intake/Output from previous day: 09/29 0701 - 09/30 0700 In: 960 [P.O.:960] Out: 1700 [Urine:1700] Intake/Output this shift:    General appearance: alert, cooperative and no distress Heart: regular rate and rhythm Lungs: coarse BS in upper airways , otherwise clear, no wheeze Abdomen: benign Extremities: no edema Wound: incis healing well  Lab Results:  Recent Labs  07/21/15 0457 07/22/15 1214  WBC 12.9* 11.3*  HGB 12.1* 12.1*  HCT 36.6* 36.4*  PLT 140* 148*   BMET:  Recent Labs  07/21/15 0457 07/22/15 1214  NA 136 138  K 4.3 3.9  CL 102 102  CO2 27 26  GLUCOSE 248* 189*  BUN 21* 19  CREATININE 1.52* 1.33*  CALCIUM 8.7* 9.0    PT/INR: No results for input(s): LABPROT, INR in the last 72 hours. ABG    Component Value Date/Time   PHART 7.467* 07/22/2015 1240   HCO3 23.9 07/22/2015 1240   TCO2 24.9 07/22/2015 1240   ACIDBASEDEF 0.4 07/15/2015 1026   O2SAT 95.6 07/22/2015 1240   CBG (last 3)   Recent Labs  07/22/15 1130 07/22/15 1616 07/22/15 2204  GLUCAP 185* 246* 138*    Meds Scheduled Meds: . acetaminophen  1,000 mg Oral 4 times per day   Or  . acetaminophen (TYLENOL) oral liquid 160 mg/5 mL  1,000 mg Oral 4 times per day  . aspirin  81 mg Oral Daily  . atorvastatin  40 mg Oral q1800    . bisacodyl  10 mg Oral Daily  . ciprofloxacin  500 mg Oral BID  . clopidogrel  75 mg Oral Daily  . diltiazem  30 mg Oral 4 times per day  . enoxaparin (LOVENOX) injection  40 mg Subcutaneous Daily  . furosemide  40 mg Oral Daily  . insulin aspart  0-20 Units Subcutaneous TID WC  . insulin aspart  10 Units Subcutaneous TID WC  . insulin glargine  60 Units Subcutaneous QHS  . isosorbide mononitrate  60 mg Oral Daily  . levothyroxine  125 mcg Oral QAC breakfast  . linagliptin  5 mg Oral Daily  . losartan  100 mg Oral Daily  . metFORMIN  850 mg Oral BID WC  . metoprolol  200 mg Oral Daily  . senna-docusate  1 tablet Oral QHS  . tiotropium  18 mcg Inhalation Daily   Continuous Infusions:  PRN Meds:.albuterol, ALPRAZolam, meperidine (DEMEROL) injection, nitroGLYCERIN, ondansetron (ZOFRAN) IV, oxyCODONE, potassium chloride, promethazine, zolpidem  Xrays Dg Chest 2 View  07/22/2015   CLINICAL DATA:  Shortness of breath, weakness, history of interstitial lung disease and cardiac stent placement, status post thoracoscopy on September 26  EXAM: CHEST  2 VIEW  COMPARISON:  Portable chest x-ray of July 21, 2015  FINDINGS: The lungs are adequately inflated. The interstitial markings are diffusely increased. There is no pneumothorax. There is pleural thickening laterally on the left. The cardiac silhouette is enlarged. The pulmonary vascularity is engorged and indistinct. The left internal jugular venous catheter tip projects over the proximal SVC. Small pleural effusions layering posteriorly are suspected.  IMPRESSION: 1. No evidence of left-sided pneumothorax. 2. Persistent diffuse airspace disease bilaterally. 3. Stable enlargement the cardiac silhouette with mild central pulmonary vascular congestion.   Electronically Signed   By: David  [place]aziland M.D.   On: 07/22/2015 07:43   Dg Chest Port 1 View  07/21/2015   CLINICAL DATA:  Removal of left-sided chest tube.  EXAM: PORTABLE CHEST 1 VIEW   COMPARISON:  Earlier film, same date.  FINDINGS: The left-sided chest tube has been removed. No definite pneumothorax. The left IJ catheter is stable. Persistent cardiac enlargement and diffuse airspace process.  IMPRESSION: Removal of left-sided chest tube without definite pneumothorax.  Stable cardiac enlargement diffuse airspace process.   Electronically Signed   By: Rudie Meyer M.D.   On: 07/21/2015 11:45    Assessment/Plan: S/P Procedure(s) (LRB): VIDEO BRONCHOSCOPY WITH FLUORO (N/A) LEFT VIDEO ASSISTED THORACOSCOPY WITH LEFT UPPER AND LOWER LUNG BIOPSY (Left)  1 He appears to be doing well, O2 is currently greater than usual home during night-so will either have to wear CPAP or use 6 liters at home at night.  Pulm recommends 3 week prednisone taper starting at 60 mg- will start this am. Will ask pharmacy to assist with schedule 2 probable home later today if all agree   LOS: 4 days    GOLD,WAYNE E 07/23/2015  Patient seen and examined, agree with above  Viviann Spare C. Dorris Fetch, MD [facility] [phone]

## 2015-07-23 NOTE — Progress Notes (Signed)
Name: Jerry Montgomery MRN: [id number] DOB: October 14, 1956    ADMISSION DATE:  07/19/2015 CONSULTATION DATE:  07/22/15  REFERRING MD :  Dr. Dorris Fetch   CHIEF COMPLAINT:  S/P VATS Biopsy   BRIEF PATIENT DESCRIPTION:  59 y/o M, former tobacco abuse, with PMH of suspected pulmonary fibrosis, R spontaneous PTX, prior VATS for blebectomy/pleurectomy (2011) CAD, WPW w Atrial Tachycardia s/p ablation, OSA, DM II, HTN, and CKD admitted 9/26 for planned lung biopsy.    SIGNIFICANT EVENTS  9/27 VATS  STUDIES:  9/28 pathology report   SUBJECTIVE: Feel great from breathing standpoint, wants to go sleep in his bed   VITAL SIGNS: Temp:  [97.5 F (36.4 C)-98.8 F (37.1 C)] 97.6 F (36.4 C) (09/30 0757) Pulse Rate:  [58-70] 70 (09/30 0757) Resp:  [13-26] 13 (09/30 0757) BP: (100-134)/(42-74) 123/73 mmHg (09/30 0757) SpO2:  [90 %-98 %] 93 % (09/30 0839)  PHYSICAL EXAMINATION:  Gen: chronically ill appearing HENT: OP clear, NCAT PULM: Crackles bases CV: RRR, no mgr, trace edema GI: BS+, soft, nontender Derm: no cyanosis or rash Psyche: normal mood and affect    Recent Labs Lab 07/20/15 0320 07/21/15 0457 07/22/15 1214  NA 136 136 138  K 4.5 4.3 3.9  CL 104 102 102  CO2 BUN 19 21* 19  CREATININE 1.44* 1.52* 1.33*  GLUCOSE 135* 248* 189*    Recent Labs Lab 07/20/15 0320 07/21/15 0457 07/22/15 1214  HGB 12.4* 12.1* 12.1*  HCT 37.0* 36.6* 36.4*  WBC 8.9 12.9* 11.3*  PLT 152 140* 148*   Dg Chest 2 View  07/22/2015   CLINICAL DATA:  Shortness of breath, weakness, history of interstitial lung disease and cardiac stent placement, status post thoracoscopy on September 26  EXAM: CHEST  2 VIEW  COMPARISON:  Portable chest x-ray of July 21, 2015  FINDINGS: The lungs are adequately inflated. The interstitial markings are diffusely increased. There is no pneumothorax. There is pleural thickening laterally on the left. The cardiac silhouette is enlarged. The pulmonary  vascularity is engorged and indistinct. The left internal jugular venous catheter tip projects over the proximal SVC. Small pleural effusions layering posteriorly are suspected.  IMPRESSION: 1. No evidence of left-sided pneumothorax. 2. Persistent diffuse airspace disease bilaterally. 3. Stable enlargement the cardiac silhouette with mild central pulmonary vascular congestion.   Electronically Signed   By: David  [place] M.D.   On: 07/22/2015 07:43   Dg Chest Port 1 View  07/21/2015   CLINICAL DATA:  Removal of left-sided chest tube.  EXAM: PORTABLE CHEST 1 VIEW  COMPARISON:  Earlier film, same date.  FINDINGS: The left-sided chest tube has been removed. No definite pneumothorax. The left IJ catheter is stable. Persistent cardiac enlargement and diffuse airspace process.  IMPRESSION: Removal of left-sided chest tube without definite pneumothorax.  Stable cardiac enlargement diffuse airspace process.   Electronically Signed   By: Rudie Meyer M.D.   On: 07/21/2015 11:45    A/P:   Acute on Chronic Hypoxic Respiratory Failure in setting of Usual Interstitial Pneumonitis - s/p VATS biopsy Now with mild UIP flare? Oxygenation back to baseline  COPD> not in exacerbation Plan: Prednisone taper as written in active orders is appropriate Pulmonary follow up 08/03/15 at 2:30 Will initiate Ofev vs Pirfenidone in office Post operative care per CVTS As now on 3L O2 per nasal cannula, could discharge home D/c home on Advair, Spiriva as ordered pre-hospital  Heber [place], MD [facility] Pager: [phone] Cell: [phone]  After 3pm or if no response, call [phone]

## 2015-07-23 NOTE — Telephone Encounter (Signed)
Yeah bring him in that time. Let him know

## 2015-07-23 NOTE — Telephone Encounter (Signed)
The only opening is on 10.11.2016 at 2:30pm 15 min slot.   Please advise. Thanks.

## 2015-07-24 ENCOUNTER — Other Ambulatory Visit: Payer: Self-pay | Admitting: Family Medicine

## 2015-07-24 NOTE — Telephone Encounter (Signed)
He can see TP to go ove rIPF Dx and discuss esbriet v ofev. I can inform her if you can let me know when he is going to see her

## 2015-07-25 LAB — CATH TIP CULTURE: Culture: NO GROWTH

## 2015-07-26 NOTE — Telephone Encounter (Signed)
Called and spoke to pt. Appt made with TP for 10.11.16. Pt verbalized understanding and denied any further questions or concerns at this time.

## 2015-07-28 LAB — ANAEROBIC CULTURE: Gram Stain: NONE SEEN

## 2015-07-30 ENCOUNTER — Other Ambulatory Visit: Payer: Self-pay | Admitting: Thoracic Surgery (Cardiothoracic Vascular Surgery)

## 2015-07-30 DIAGNOSIS — J849 Interstitial pulmonary disease, unspecified: Secondary | ICD-10-CM

## 2015-08-03 ENCOUNTER — Ambulatory Visit
Admission: RE | Admit: 2015-08-03 | Discharge: 2015-08-03 | Disposition: A | Discharge status: Home / Self Care | Payer: Medicare Other | Source: Ambulatory Visit | Attending: Thoracic Surgery (Cardiothoracic Vascular Surgery) | Admitting: Thoracic Surgery (Cardiothoracic Vascular Surgery)

## 2015-08-03 ENCOUNTER — Ambulatory Visit (INDEPENDENT_AMBULATORY_CARE_PROVIDER_SITE_OTHER): Payer: Medicare Other | Admitting: Adult Health

## 2015-08-03 ENCOUNTER — Encounter: Payer: Self-pay | Admitting: Thoracic Surgery (Cardiothoracic Vascular Surgery)

## 2015-08-03 ENCOUNTER — Encounter: Payer: Self-pay | Admitting: Adult Health

## 2015-08-03 ENCOUNTER — Ambulatory Visit (INDEPENDENT_AMBULATORY_CARE_PROVIDER_SITE_OTHER): Payer: Self-pay | Admitting: Thoracic Surgery (Cardiothoracic Vascular Surgery)

## 2015-08-03 ENCOUNTER — Ambulatory Visit: Payer: Medicare Other | Admitting: Adult Health

## 2015-08-03 VITALS — BP 128/70 | HR 60 | Temp 97.8°F | Ht 73.0 in | Wt 256.0 lb

## 2015-08-03 VITALS — BP 108/62 | HR 61 | Resp 16 | Ht 73.0 in | Wt 265.0 lb

## 2015-08-03 DIAGNOSIS — Z09 Encounter for follow-up examination after completed treatment for conditions other than malignant neoplasm: Secondary | ICD-10-CM

## 2015-08-03 DIAGNOSIS — J849 Interstitial pulmonary disease, unspecified: Secondary | ICD-10-CM

## 2015-08-03 DIAGNOSIS — J84112 Idiopathic pulmonary fibrosis: Secondary | ICD-10-CM | POA: Diagnosis not present

## 2015-08-03 DIAGNOSIS — J9611 Chronic respiratory failure with hypoxia: Secondary | ICD-10-CM

## 2015-08-03 MED ORDER — NINTEDANIB ESYLATE 150 MG PO CAPS: 150.0000 mg | ORAL_CAPSULE | Freq: Two times a day (BID) | ORAL | Status: DC

## 2015-08-03 MED ORDER — CYCLOBENZAPRINE HCL 10 MG PO TABS: 10.0000 mg | ORAL_TABLET | Freq: Three times a day (TID) | ORAL | Status: DC | PRN

## 2015-08-03 NOTE — Assessment & Plan Note (Addendum)
ILD with recent VATS w/ lung bx , path confirmed UIP /IPF  Discussed results with pt and family .  Intolerant to esbriet in past  Will begin OFEV paperwork  Will need LFT on return if able to start    Plan  Begin OFEV , we will start paperwork process.  Continue on Oxygen 3l/m rest and 4l/m At bedtime   Follow up Dr. [name] in 6 weeks and As needed

## 2015-08-03 NOTE — Assessment & Plan Note (Signed)
Compensated on O2   Plan  Continue on Oxygen 3l/m rest and 4l/m At bedtime   Follow up Dr. [name] in 6 weeks and As needed

## 2015-08-03 NOTE — Progress Notes (Signed)
[address]       [place]erry [place] [address]             [phone]       HPI: [name] is a for scheduled postoperative follow-up visit.  He is a 59 year old man with interstitial lung disease to had a thoracoscopic lung biopsy on 07/19/2015. Postoperatively he had some delirium. That resolved prior to discharge. His daughter says it was about another week or so at home before he was completely back to normal.  His pathology showed UIP. He saw Jerry Montgomery at the pulmonary office today. He said she is looking into getting him on a new medication, possibly Ofev.  He has some incisional pain but is not taking any narcotics. He complains of muscle spasms which initially were worse on the right but now her bilateral. His breathing is about the same. He is using oxygen.  Past Medical History  Diagnosis Date  . Hypertension   . Hyperlipidemia   . Postinflammatory pulmonary fibrosis (HCC)   . CAD (coronary artery disease)   . Pneumothorax on right 4/11  . COPD (chronic obstructive pulmonary disease) (HCC)   . Atrial tachycardia (HCC)   . Acute diastolic heart failure (HCC)   . On home oxygen therapy     "3L; 24/7" (04/15/2015)  . Sleep apnea     "won't use CPAP" (04/14/2105)  . Hypothyroidism   . Type II diabetes mellitus (HCC)   . WPW (Wolff-Parkinson-White syndrome)     Per Dr. Lubertha Basque Note  . SVT (supraventricular tachycardia) (HCC)     Per Dr. Lubertha Basque note  . Atrial fibrillation [facility])       Current Outpatient Prescriptions  Medication Sig Dispense Refill  . ACCU-CHEK SOFTCLIX LANCETS lancets Check blood sugar twice daily 100 each 1  . ACCU-CHEK SOFTCLIX LANCETS lancets USE AS INSTRUCTED 100 each 11  . ADVAIR HFA 115-21 MCG/ACT inhaler Inhale 2 puffs into the lungs 2 (two) times daily.    Marland Kitchen albuterol (PROVENTIL HFA;VENTOLIN HFA) 108 (90 BASE) MCG/ACT inhaler Inhale 2 puffs into the lungs every 6 (six) hours as needed for wheezing. 1 Inhaler 0  .  ALPRAZolam (XANAX) 0.5 MG tablet Take 1 tablet (0.5 mg total) by mouth 3 (three) times daily as needed for anxiety. 90 tablet 0  . aspirin 81 MG tablet Take 81 mg by mouth daily.    Marland Kitchen atorvastatin (LIPITOR) 40 MG tablet Take 1 tablet by mouth  every night at bedtime 90 tablet 1  . B-D ULTRAFINE III SHORT PEN 31G X 8 MM MISC Use as directed 240 each 3  . clopidogrel (PLAVIX) 75 MG tablet Take 1 tablet by mouth  every day 90 tablet 3  . diltiazem (CARDIZEM) 30 MG tablet Take 1 tablet (30 mg total) by mouth every 6 (six) hours. 360 tablet 3  . furosemide (LASIX) 20 MG tablet Take 1 tablet (20 mg total) by mouth daily as needed for fluid (fluid). 90 tablet 3  . glucose blood (ACCU-CHEK AVIVA PLUS) test strip Check bs 4-5 times per day DX-250.00 (Patient taking differently: 1 each by Other route See admin instructions. Check blood sugar 2 times daily) 100 each 11  . Insulin Glargine (LANTUS SOLOSTAR) 100 UNIT/ML Solostar Pen Inject 60 Units into the skin at bedtime. 15 mL 11  . insulin lispro (HUMALOG) 100 UNIT/ML KiwkPen Inject 10units into the skin with breakfast and lunch, and inject 20units into the skin with dinner. (Patient taking  differently: Inject 10-30 Units into the skin 3 (three) times daily. Inject 10units into the skin with breakfast and 10units with lunch, and inject 20 or 30units into the skin with dinner depending on A1C level.) 30 mL 11  . isosorbide mononitrate (IMDUR) 60 MG 24 hr tablet Take 1 and 1/2 tablets by  mouth every day (Patient taking differently: Take 60 mg by  mouth every day) 135 tablet 1  . levothyroxine (SYNTHROID, LEVOTHROID) 125 MCG tablet Take 1 tablet (125 mcg total) by mouth daily. 90 tablet 3  . linagliptin (TRADJENTA) 5 MG TABS tablet Take 1 tablet (5 mg total) by mouth daily. 90 tablet 3  . losartan (COZAAR) 100 MG tablet Take 1 tablet (100 mg total) by mouth daily. 90 tablet 3  . Menthol, Topical Analgesic, (BIOFREEZE EX) Apply 1 application topically as needed  (for pain).    . metFORMIN (GLUCOPHAGE) 850 MG tablet Take 1 tablet by mouth  twice a day 180 tablet 0  . metoprolol (TOPROL XL) 200 MG 24 hr tablet Take 1 tablet (200 mg total) by mouth daily. 90 tablet 3  . nitroGLYCERIN (NITROSTAT) 0.4 MG SL tablet Place 1 tablet (0.4 mg total) under the tongue every 5 (five) minutes as needed for chest pain. 25 tablet 1  . predniSONE (DELTASONE) 10 MG tablet Take 60 mg daily for 3 days, followed by;  50 mg daily for 4 days, followed by: 40 mg daily for 4 days, followed by; 30 mg daily for 3 days, followed by; 20 mg daily for 2 days, followed by; 10 mg daily for 2 days, followed by; 5 mg daily for 2 days then stop 60 tablet 0  . SYRINGE/NEEDLE, DISP, 1 ML (B-D SYRINGE/NEEDLE 1CC/25GX5/8) 25G X 5/8" 1 ML MISC As directed 100 each 5  . tiotropium (SPIRIVA) 18 MCG inhalation capsule Place 18 mcg into inhaler and inhale daily.     . cyclobenzaprine (FLEXERIL) 10 MG tablet Take 1 tablet (10 mg total) by mouth 3 (three) times daily as needed for muscle spasms. 30 tablet 0   No current facility-administered medications for this visit.    Physical Exam BP 108/62 mmHg  Pulse 61  Resp 16  Ht  (1.854 m)  Wt 265 lb (120.203 kg)  BMI 34.97 kg/m2  SpO59 42% 59 year old man in no acute distress Does use accessory muscles of breathing Lungs with diffuse crackles Incisions healing well  Diagnostic Tests: CHEST 2 VIEW  COMPARISON: 07/22/2015  FINDINGS: Mild cardiomegaly. Interstitial prominence throughout the lungs compatible with chronic interstitial lung disease, most pronounced in the lower lung zones. No pneumothorax. No confluent airspace opacities or effusions. No acute bony abnormality.  IMPRESSION: Cardiomegaly. Chronic interstitial disease.  No acute findings.   Electronically Signed  By: Charlett Nose M.D.  On: 08/03/2015 14:39  I personally reviewed the chest x-ray and concur with the findings  above  Impression: 59 year old man with UIP. He is now about 2 weeks out from my lung biopsy. He is recovering well from the surgery. He is discouraged about his prognosis.  He is complaining of muscle spasms. I gave him a prescription for Flexeril 10 mg by mouth 3 times a day when necessary for spasms.  He may resume normal activities as his discomfort allows.  Plan:  He will follow-up with Dr. [name]. He has an appointment scheduled for 10/22.  I will be happy to see him back if I can be of any further assistance with his care. [name],  MD [facility] [phone]

## 2015-08-03 NOTE — Progress Notes (Signed)
Subjective:    Patient ID: Jerry Montgomery, male    DOB: 04/10/1956, 59 y.o.   MRN: [id number]  HPI 59 year old male former smoker with hx of ILD  Hx of PTX s/p VATS pleurodesis in 2013.   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 ANA autoimmune panel was negative, with trial of Esbriet but unable to tolerate.    02/2015  Autpimmune ab panel 03/09/15 and HP panel - all negative. C-anca trace positive but mpo/pr3 negative  PFT fvc 3.5L/64%, Ratio 72, TLC 6L/76%, DLCO 15/39% - moderate restriction. (Worse to stable  compared to 2013 when FVC was 4.1 L/78% and TLC was 6 L DLCO was 16/54%]  CT chest 03/18/2015  repirted 03/24/2015 i sAGAINST UIP and is worse compared to 2014. Other findings on CT - enlarged PA artery - echo 2014 showed PASP 40s and co art calcification - stress lexiscan 2014 was normal.   Right heart catheterization done 05/18/2015  - Report reviewed personally cardiac output 5.84 L/m. Cardiac index 2.45 L/m per BSA, pulmonary arteries with mean pressure 18 mm and a wedge of 7. Reported as normal   Walk test 185 feet 3 laps on room air  - WE left him on room air for 20 minutes and then walked him 185 feet 3 laps. He desaturated only on the second lap to below 88%.  08/03/2015 Post Hospital follow up  Pt returns for follow up . Recently admitted for lung by , underwent left  VATS w/ lung bx.  Path showed UIP /IPF .  We discussed his path results. He has previously been on esbriet but did not tolerate.  We discussed starting on OFEV. He said he would consider this if it was affordable.  He is worried about cost and insurance is changing.  He remains on Oxygen 3l/m rest and 4l/m bedtime .  Has follow up with Dr. Dorris Fetch this evening with planned cxr .  He does okay at rest but gets winded with activity . Has some nasal congestion, blew out some blood but  No nose bleeds.  Feels more  independent with his O2 POC       Review of Systems  Constitutional: Negative for fever and unexpected weight change.  HENT: Negative for congestion, dental problem, ear pain, nosebleeds, postnasal drip, rhinorrhea, sinus pressure, sneezing, sore throat and trouble swallowing.   Eyes: Negative for redness and itching.  Respiratory: Positive for cough and shortness of breath. Negative for chest tightness and wheezing.   Cardiovascular: Negative for palpitations and leg swelling.  Gastrointestinal: Negative for nausea and vomiting.  Genitourinary: Negative for dysuria.  Musculoskeletal: Negative for joint swelling.  Skin: Negative for rash.  Neurological: Negative for headaches.  Hematological: Does not bruise/bleed easily.  Psychiatric/Behavioral: Negative for dysphoric mood. The patient is not nervous/anxious.        Objective:   Physical Exam GEN: A/Ox3; pleasant , NAD, chronically ill appearing   HEENT:  [place]/AT,  EACs-clear, TMs-wnl, NOSE-clear, THROAT-clear, no lesions, no postnasal drip or exudate noted.   NECK:  Supple w/ fair ROM; no JVD; normal carotid impulses w/o bruits; no thyromegaly or nodules palpated; no lymphadenopathy.  RESP  BB crackles noted. no accessory muscle use, no dullness to percussion  CARD:  RRR, no m/r/g  , no peripheral edema, pulses intact, no cyanosis or clubbing.  GI:   Soft & nt; nml bowel sounds; no organomegaly or masses  detected.  Musco: Warm bil, no deformities or joint swelling noted.   Neuro: alert, no focal deficits noted.    Skin: Warm, no lesions or rashes

## 2015-08-03 NOTE — Patient Instructions (Signed)
Begin OFEV , we will start paperwork process.  Continue on Oxygen 3l/m rest and 4l/m At bedtime   Follow up Dr. [name] in 6 weeks and As needed

## 2015-08-06 ENCOUNTER — Telehealth: Payer: Self-pay | Admitting: *Deleted

## 2015-08-06 NOTE — Telephone Encounter (Signed)
Faxed in [facility] to [facility] to [phone] to initiate a PA for Ofev. Will await response. Pt ID: [id number].

## 2015-08-06 NOTE — Telephone Encounter (Signed)
Received approval for Ofev until 10/23/2015. Ref# [id number]. [facility] informed but they state that they don't have an rx for this but the PA form came with their name on it. Can you help on this Robynn Panelise? Thanks.

## 2015-08-09 NOTE — Telephone Encounter (Signed)
Called and spoke to [place] at [facility] [phone]). Adela LankJacqueline stated the medication is shipping to the Jerry Montgomery's home today. Called and spoke to Jerry Montgomery and informed him of the status. Jerry Montgomery verbalized understanding and denied any further questions or concerns at this time.

## 2015-08-11 LAB — HM DIABETES EYE EXAM

## 2015-08-13 LAB — FUNGUS CULTURE W SMEAR: Fungal Smear: NONE SEEN

## 2015-08-16 LAB — FUNGUS CULTURE W SMEAR: FUNGAL SMEAR: NONE SEEN

## 2015-08-19 ENCOUNTER — Other Ambulatory Visit: Payer: Self-pay | Admitting: Family Medicine

## 2015-08-19 MED ORDER — METFORMIN HCL 850 MG PO TABS: ORAL_TABLET | ORAL | Status: DC

## 2015-08-25 ENCOUNTER — Inpatient Hospital Stay: Payer: Medicare Other | Admitting: Internal Medicine

## 2015-08-31 LAB — AFB CULTURE WITH SMEAR (NOT AT ARMC)
ACID FAST SMEAR: NONE SEEN
Acid Fast Smear: NONE SEEN

## 2015-09-14 ENCOUNTER — Other Ambulatory Visit (INDEPENDENT_AMBULATORY_CARE_PROVIDER_SITE_OTHER): Payer: Medicare Other

## 2015-09-14 ENCOUNTER — Ambulatory Visit (INDEPENDENT_AMBULATORY_CARE_PROVIDER_SITE_OTHER): Payer: Medicare Other | Admitting: Internal Medicine

## 2015-09-14 ENCOUNTER — Encounter: Payer: Self-pay | Admitting: Internal Medicine

## 2015-09-14 VITALS — BP 112/70 | HR 55 | Ht 73.0 in | Wt 260.0 lb

## 2015-09-14 DIAGNOSIS — J84112 Idiopathic pulmonary fibrosis: Secondary | ICD-10-CM

## 2015-09-14 DIAGNOSIS — R5383 Other fatigue: Secondary | ICD-10-CM | POA: Diagnosis not present

## 2015-09-14 DIAGNOSIS — J9611 Chronic respiratory failure with hypoxia: Secondary | ICD-10-CM

## 2015-09-14 DIAGNOSIS — Z5181 Encounter for therapeutic drug level monitoring: Secondary | ICD-10-CM | POA: Diagnosis not present

## 2015-09-14 LAB — HEPATIC FUNCTION PANEL
ALBUMIN: 3.8 g/dL (ref 3.5–5.2)
ALT: 21 U/L (ref 0–53)
AST: 19 U/L (ref 0–37)
Alkaline Phosphatase: 61 U/L (ref 39–117)
BILIRUBIN DIRECT: 0.1 mg/dL (ref 0.0–0.3)
BILIRUBIN TOTAL: 0.7 mg/dL (ref 0.2–1.2)
Total Protein: 6.6 g/dL (ref 6.0–8.3)

## 2015-09-14 NOTE — Progress Notes (Signed)
Subjective:     Patient ID: Jerry Montgomery, male   DOB: 1956/05/06, 59 y.o.   MRN: [id number]  HPI   OV 09/14/2015  Chief Complaint  Patient presents with  . Follow-up    Pt states his breathing is unchanged since last OV. Pt now on Ofev  BID. Pt states he has had an increase in fatigue. Pt denies GI upset. Pt c/o mild dry cough - baseline.     Moderate severity interstitial lung disease with hypoxemic respiratory failure. Diagnosed with idiopathic pulmonary fibrosis September/October 2016. This is one-month follow-up after staring ofev. He is having new fatigue that is significant. He thinks is due to the drug. It is persistent. Worse with exertion. No change in oxygenation or shortness of breath. No associated acid reflux or weight loss or diarrhea that are more common side effects with ofev. He wanted to know the diagnosis and prognosis in more detail. He does not think he can lose weight. He also does not think he can attend pulmonary rehabilitation because his right knee buckles easily   Current outpatient prescriptions:  .  ACCU-CHEK SOFTCLIX LANCETS lancets, USE AS INSTRUCTED, Disp: 100 each, Rfl: 11 .  ADVAIR HFA 115-21 MCG/ACT inhaler, Inhale 2 puffs into the lungs 2 (two) times daily., Disp: , Rfl:  .  albuterol (PROVENTIL HFA;VENTOLIN HFA) 108 (90 BASE) MCG/ACT inhaler, Inhale 2 puffs into the lungs every 6 (six) hours as needed for wheezing., Disp: 1 Inhaler, Rfl: 0 .  ALPRAZolam (XANAX) 0.5 MG tablet, Take 1 tablet (0.5 mg total) by mouth 3 (three) times daily as needed for anxiety., Disp: 90 tablet, Rfl: 0 .  aspirin 81 MG tablet, Take 81 mg by mouth daily., Disp: , Rfl:  .  atorvastatin (LIPITOR) 40 MG tablet, Take 1 tablet by mouth  every night at bedtime, Disp: 90 tablet, Rfl: 1 .  B-D ULTRAFINE III SHORT PEN 31G X 8 MM MISC, Use as directed, Disp: 240 each, Rfl: 3 .  clopidogrel (PLAVIX) 75 MG tablet, Take 1 tablet by mouth  every day, Disp: 90 tablet, Rfl: 3 .   cyclobenzaprine (FLEXERIL) 10 MG tablet, Take 1 tablet (10 mg total) by mouth 3 (three) times daily as needed for muscle spasms., Disp: 30 tablet, Rfl: 0 .  diltiazem (CARDIZEM) 30 MG tablet, Take 1 tablet (30 mg total) by mouth every 6 (six) hours., Disp: 360 tablet, Rfl: 3 .  furosemide (LASIX) 20 MG tablet, Take 1 tablet (20 mg total) by mouth daily as needed for fluid (fluid)., Disp: 90 tablet, Rfl: 3 .  glucose blood (ACCU-CHEK AVIVA PLUS) test strip, Check bs 4-5 times per day DX-250.00 (Patient taking differently: 1 each by Other route See admin instructions. Check blood sugar 2 times daily), Disp: 100 each, Rfl: 11 .  Insulin Glargine (LANTUS SOLOSTAR) 100 UNIT/ML Solostar Pen, Inject 60 Units into the skin at bedtime., Disp: 15 mL, Rfl: 11 .  insulin lispro (HUMALOG) 100 UNIT/ML KiwkPen, Inject 10units into the skin with breakfast and lunch, and inject 20units into the skin with dinner. (Patient taking differently: Inject 10-30 Units into the skin 3 (three) times daily. Inject 10units into the skin with breakfast and 10units with lunch, and inject 20 or 30units into the skin with dinner depending on A1C level.), Disp: 30 mL, Rfl: 11 .  isosorbide mononitrate (IMDUR) 60 MG 24 hr tablet, Take 1 and 1/2 tablets by  mouth every day (Patient taking differently: Take 60 mg by  mouth every day),  Disp: 135 tablet, Rfl: 1 .  levothyroxine (SYNTHROID, LEVOTHROID) 125 MCG tablet, Take 1 tablet (125 mcg total) by mouth daily., Disp: 90 tablet, Rfl: 3 .  linagliptin (TRADJENTA) 5 MG TABS tablet, Take 1 tablet (5 mg total) by mouth daily., Disp: 90 tablet, Rfl: 3 .  losartan (COZAAR) 100 MG tablet, Take 1 tablet (100 mg total) by mouth daily., Disp: 90 tablet, Rfl: 3 .  Menthol, Topical Analgesic, (BIOFREEZE EX), Apply 1 application topically as needed (for pain)., Disp: , Rfl:  .  metFORMIN (GLUCOPHAGE) 850 MG tablet, Take 1 tablet by mouth  twice a day, Disp: 180 tablet, Rfl: 1 .  metoprolol (TOPROL XL) 200  MG 24 hr tablet, Take 1 tablet (200 mg total) by mouth daily., Disp: 90 tablet, Rfl: 3 .  Nintedanib (OFEV) 150 MG CAPS, Take 150 mg by mouth 2 (two) times daily., Disp: , Rfl:  .  nitroGLYCERIN (NITROSTAT) 0.4 MG SL tablet, Place 1 tablet (0.4 mg total) under the tongue every 5 (five) minutes as needed for chest pain., Disp: 25 tablet, Rfl: 1 .  SYRINGE/NEEDLE, DISP, 1 ML (B-D SYRINGE/NEEDLE 1CC/25GX5/8) 25G X 5/8" 1 ML MISC, As directed, Disp: 100 each, Rfl: 5 .  tiotropium (SPIRIVA) 18 MCG inhalation capsule, Place 18 mcg into inhaler and inhale daily. , Disp: , Rfl:   Allergies  Allergen Reactions  . Niaspan [Niacin] Itching  . Altace [Ramipril] Other (See Comments)    Cough    Immunization History  Administered Date(s) Administered  . Influenza Whole 07/23/2012  . Influenza,inj,Quad PF,36+ Mos 07/08/2015  . Influenza-Unspecified 07/16/2014  . Pneumococcal Conjugate-13 11/10/2013  . Pneumococcal Polysaccharide-23 07/08/2015    Review of Systems     Objective:   Physical Exam Filed Vitals:   09/14/15 1506  BP: 112/70  Pulse: 55  Height: 6\' 1"  (1.854 m)  Weight: 260 lb (117.935 kg)  SpO2: 98%   Estimated body mass index is 34.31 kg/(m^2) as calculated from the following:   Height as of this encounter: 6\' 1"  (1.854 m).   Weight as of this encounter: 260 lb (117.935 kg).      Assessment:       ICD-9-CM ICD-10-CM   1. Chronic respiratory failure with hypoxia (HCC) 518.83 J96.11    799.02    2. IPF (idiopathic pulmonary fibrosis) (HCC) 516.31 J84.112   3. Encounter for therapeutic drug monitoring V58.83 Z51.81   4. Other fatigue 780.79 R53.83    IPF appears to be stable. He is having new fatigue after he started ofev, therefore this is likely drug related. However he wants to push through with the drug.     Plan:      - Take brochure on the disease IPF - we discussed this dz in detail - Disease appears stable - Encourage her lose weight through a weight loss  program using dietary interventions - Respect the fact that you cannot do pulmonary rehabilitation due to right knee problem - Fatigue likely due to the new pulmonary fibrosis drug ofev  - Check liver function test today-we will call you with the result  - If fatigue is intolerable and do wish to back off OFEV, you can do it once daily but please call us on [phone] and let us know that before you do it   - For now continue taking- OFev as scheduled 150mg  twice daily - You are a potential lung transplant candidate but he will need substantial weight loss  Follow-up  - Repeat liver  function test in 1 month  - 1 month to see myself or my nurse practitioner Tammy   > 50% of this > 25 min visit spent in face to face counseling or coordination of care    Dr. Kalman Shan, M.D., [facility]C.P Pulmonary and Critical Care Medicine Staff Physician [facility] [place] [facility] Pager: [phone], If no answer or between  15:00h - 7:00h: call [phone]  09/14/2015 3:35 PM

## 2015-09-14 NOTE — Patient Instructions (Signed)
ICD-9-CM ICD-10-CM   1. Chronic respiratory failure with hypoxia (HCC) 518.83 J96.11    799.02    2. IPF (idiopathic pulmonary fibrosis) (HCC) 516.31 J84.112   3. Encounter for therapeutic drug monitoring V58.83 Z51.81   4. Other fatigue 780.79 R53.83     - Disease appears stable - Encourage her lose weight through a weight loss program using dietary interventions - Respect the fact that you cannot do pulmonary rehabilitation due to right knee problem - Fatigue likely due to the new pulmonary fibrosis drug ofev  - Check liver function test today-we will call you with the result  - If fatigue is intolerable and do wish to back off OFEV, you can do it once daily but please call us on [phone] and let us know that before you do it   - For now continue taking- OFev as scheduled 150mg  twice daily - You are a potential lung transplant candidate but he will need substantial weight loss  Follow-up  - Repeat liver function test in 1 month  - 1 month to see myself or my nurse practitioner Tammy

## 2015-09-15 NOTE — Progress Notes (Signed)
Quick Note:  Called and spoke to pt. Informed him of the results per MR. Pt verbalized understanding and denied any further questions or concerns at this time.   ______ 

## 2015-10-14 ENCOUNTER — Encounter: Payer: Self-pay | Admitting: Adult Health

## 2015-10-14 ENCOUNTER — Ambulatory Visit (INDEPENDENT_AMBULATORY_CARE_PROVIDER_SITE_OTHER): Payer: Medicare Other | Admitting: Adult Health

## 2015-10-14 VITALS — BP 110/82 | HR 56 | Temp 98.0°F | Ht 73.0 in | Wt 259.0 lb

## 2015-10-14 DIAGNOSIS — J449 Chronic obstructive pulmonary disease, unspecified: Secondary | ICD-10-CM

## 2015-10-14 DIAGNOSIS — J9611 Chronic respiratory failure with hypoxia: Secondary | ICD-10-CM

## 2015-10-14 DIAGNOSIS — J84112 Idiopathic pulmonary fibrosis: Secondary | ICD-10-CM

## 2015-10-14 NOTE — Assessment & Plan Note (Signed)
Unable to tolerate OFEV , also intolerant to Esbriet in past.  Long discussion with pt , he says he is not taking these meds  Will cont to follow closely

## 2015-10-14 NOTE — Assessment & Plan Note (Signed)
Compensated on O2  

## 2015-10-14 NOTE — Progress Notes (Signed)
Subjective:    Patient ID: Jerry Montgomery, male    DOB: 11/29/1955, 59 y.o.   MRN: [id number]  HPI    Review of Systems     Objective:   Physical Exam        Assessment & Plan:    Subjective:    Patient ID: Jerry NewtonRobert S Montgomery, male    DOB: 02/15/1956, 59 y.o.   MRN: [id number]  HPI 59 year old male former smoker with hx of ILD  Hx of PTX s/p VATS pleurodesis in 2013.   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 ANA autoimmune panel was negative, with trial of Esbriet but unable to tolerate.    02/2015  [facility] ab panel 03/09/15 and HP panel - all negative. C-anca trace positive but mpo/pr3 negative  PFT fvc 3.5L/64%, Ratio 72, TLC 6L/76%, DLCO 15/39% - moderate restriction. (Worse to stable  compared to 2013 when FVC was 4.1 L/78% and TLC was 6 L DLCO was 16/54%]  CT chest 03/18/2015  repirted 03/24/2015 i sAGAINST UIP and is worse compared to 2014. Other findings on CT - enlarged PA artery - echo 2014 showed PASP 40s and co art calcification - stress lexiscan 2014 was normal.   Right heart catheterization done 05/18/2015  - Report reviewed personally cardiac output 5.84 L/m. Cardiac index 2.45 L/m per BSA, pulmonary arteries with mean pressure 18 mm and a wedge of 7. Reported as normal   Walk test 185 feet 3 laps on room air  - WE left him on room air for 20 minutes and then walked him 185 feet 3 laps. He desaturated only on the second lap to below 88%.  10/14/2015  Follow up : UIP /IPF .  Pt returns for 1 month follow up. Pt states he has not took OFEV x 3 weeks.   Says insurance does not cover .discussed looking in to grants, patient assitance to help with cost.   Says he is not going to take it because it makes him feel so bad. Has extreme fatigue. This resolves when he stops med.  Previously similar symptoms with Esbriet.  We discussed his dz state and potential for progression.  He says  he wants quality of life and not feel bad all the time.  He remains on Oxygen 3l/m rest and 4l/m bedtime .  Has follow up with Dr. Dorris FetchHendrickson this evening with planned cxr .  He does okay at rest but gets winded with activity . Has some nasal congestion, blew out some blood but  No nose bleeds.  Remains on Advair and spiriva .    Past Medical History  Diagnosis Date  . Hypertension   . Hyperlipidemia   . Postinflammatory pulmonary fibrosis (HCC)   . CAD (coronary artery disease)   . Pneumothorax on right 4/11  . COPD (chronic obstructive pulmonary disease) (HCC)   . Atrial tachycardia (HCC)   . Acute diastolic heart failure (HCC)   . On home oxygen therapy     "3L; 24/7" (04/15/2015)  . Sleep apnea     "won't use CPAP" (04/14/2105)  . Hypothyroidism   . Type II diabetes mellitus (HCC)   . WPW (Wolff-Parkinson-White syndrome)     Per Dr. [name]'s Note  . SVT (supraventricular tachycardia) (HCC)     Per Dr. [name]'s note  . Atrial fibrillation [facility](HCC)    Current Outpatient Prescriptions on File Prior to Visit  Medication Sig  Dispense Refill  . ACCU-CHEK SOFTCLIX LANCETS lancets USE AS INSTRUCTED 100 each 11  . ADVAIR HFA 115-21 MCG/ACT inhaler Inhale 2 puffs into the lungs 2 (two) times daily.    Marland Kitchen albuterol (PROVENTIL HFA;VENTOLIN HFA) 108 (90 BASE) MCG/ACT inhaler Inhale 2 puffs into the lungs every 6 (six) hours as needed for wheezing. 1 Inhaler 0  . ALPRAZolam (XANAX) 0.5 MG tablet Take 1 tablet (0.5 mg total) by mouth 3 (three) times daily as needed for anxiety. 90 tablet 0  . aspirin 81 MG tablet Take 81 mg by mouth daily.    Marland Kitchen atorvastatin (LIPITOR) 40 MG tablet Take 1 tablet by mouth  every night at bedtime 90 tablet 1  . B-D ULTRAFINE III SHORT PEN 31G X 8 MM MISC Use as directed 240 each 3  . clopidogrel (PLAVIX) 75 MG tablet Take 1 tablet by mouth  every day 90 tablet 3  . cyclobenzaprine (FLEXERIL) 10 MG tablet Take 1 tablet (10 mg total) by mouth 3 (three) times daily  as needed for muscle spasms. 30 tablet 0  . diltiazem (CARDIZEM) 30 MG tablet Take 1 tablet (30 mg total) by mouth every 6 (six) hours. 360 tablet 3  . furosemide (LASIX) 20 MG tablet Take 1 tablet (20 mg total) by mouth daily as needed for fluid (fluid). 90 tablet 3  . glucose blood (ACCU-CHEK AVIVA PLUS) test strip Check bs 4-5 times per day [id number] (Patient taking differently: 1 each by Other route See admin instructions. Check blood sugar 2 times daily) 100 each 11  . Insulin Glargine (LANTUS SOLOSTAR) 100 UNIT/ML Solostar Pen Inject 60 Units into the skin at bedtime. 15 mL 11  . insulin lispro (HUMALOG) 100 UNIT/ML KiwkPen Inject 10units into the skin with breakfast and lunch, and inject 20units into the skin with dinner. (Patient taking differently: Inject 10-30 Units into the skin 3 (three) times daily. Inject 10units into the skin with breakfast and 10units with lunch, and inject 20 or 30units into the skin with dinner depending on A1C level.) 30 mL 11  . isosorbide mononitrate (IMDUR) 60 MG 24 hr tablet Take 1 and 1/2 tablets by  mouth every day (Patient taking differently: Take 60 mg by  mouth every day) 135 tablet 1  . levothyroxine (SYNTHROID, LEVOTHROID) 125 MCG tablet Take 1 tablet (125 mcg total) by mouth daily. 90 tablet 3  . linagliptin (TRADJENTA) 5 MG TABS tablet Take 1 tablet (5 mg total) by mouth daily. 90 tablet 3  . losartan (COZAAR) 100 MG tablet Take 1 tablet (100 mg total) by mouth daily. 90 tablet 3  . Menthol, Topical Analgesic, (BIOFREEZE EX) Apply 1 application topically as needed (for pain).    . metFORMIN (GLUCOPHAGE) 850 MG tablet Take 1 tablet by mouth  twice a day 180 tablet 1  . metoprolol (TOPROL XL) 200 MG 24 hr tablet Take 1 tablet (200 mg total) by mouth daily. 90 tablet 3  . nitroGLYCERIN (NITROSTAT) 0.4 MG SL tablet Place 1 tablet (0.4 mg total) under the tongue every 5 (five) minutes as needed for chest pain. 25 tablet 1  . SYRINGE/NEEDLE, DISP, 1 ML (B-D  SYRINGE/NEEDLE 1CC/25GX5/8) 25G X 5/8" 1 ML MISC As directed 100 each 5  . tiotropium (SPIRIVA) 18 MCG inhalation capsule Place 18 mcg into inhaler and inhale daily.     . Nintedanib (OFEV) 150 MG CAPS Take 150 mg by mouth 2 (two) times daily. (Patient not taking: Reported on 10/14/2015)  No current facility-administered medications on file prior to visit.        Review of Systems  Constitutional: Negative for fever and unexpected weight change.  HENT: Negative for congestion, dental problem, ear pain, nosebleeds, postnasal drip, rhinorrhea, sinus pressure, sneezing, sore throat and trouble swallowing.   Eyes: Negative for redness and itching.  Respiratory: Positive for cough and shortness of breath. Negative for chest tightness and wheezing.   Cardiovascular: Negative for palpitations and leg swelling.  Gastrointestinal: Negative for nausea and vomiting.  Genitourinary: Negative for dysuria.  Musculoskeletal: Negative for joint swelling.  Skin: Negative for rash.  Neurological: Negative for headaches.  Hematological: Does not bruise/bleed easily.  Psychiatric/Behavioral: Negative for dysphoric mood. The patient is not nervous/anxious.        Objective:   Physical Exam Filed Vitals:   10/14/15 0859  BP: 110/82  Pulse: 56  Temp: 98 F (36.7 C)  TempSrc: Oral  Height:  (1.854 m)  Weight: 259 lb (117.482 kg)  SpO2: 93%    GEN: A/Ox3; pleasant , NAD, chronically ill appearing   HEENT:  [place]/AT,  EACs-clear, TMs-wnl, NOSE-clear, THROAT-clear, no lesions, no postnasal drip or exudate noted.   NECK:  Supple w/ fair ROM; no JVD; normal carotid impulses w/o bruits; no thyromegaly or nodules palpated; no lymphadenopathy.  RESP  BB crackles noted. no accessory muscle use, no dullness to percussion  CARD:  RRR, no m/r/g  , no peripheral edema, pulses intact, no cyanosis or clubbing.  GI:   Soft & nt; nml bowel sounds; no organomegaly or masses detected.  Musco: Warm  bil, no deformities or joint swelling noted.   Neuro: alert, no focal deficits noted.    Skin: Warm, no lesions or rashes

## 2015-10-14 NOTE — Assessment & Plan Note (Signed)
Compensated on Advair and Spiriva

## 2015-10-14 NOTE — Patient Instructions (Signed)
Continue on Advair and Spiriva .  Continue on Oxygen 3l/m rest and 4l/m At bedtime   Follow up Dr. [name] in 3 months and As needed   Please contact office for sooner follow up if symptoms do not improve or worsen or seek emergency care

## 2015-11-03 ENCOUNTER — Other Ambulatory Visit: Payer: Self-pay | Admitting: Family Medicine

## 2015-11-03 DIAGNOSIS — E038 Other specified hypothyroidism: Secondary | ICD-10-CM

## 2015-11-03 MED ORDER — LEVOTHYROXINE SODIUM 125 MCG PO TABS: 125.0000 ug | ORAL_TABLET | Freq: Every day | ORAL | Status: DC

## 2015-11-08 ENCOUNTER — Telehealth: Payer: Self-pay | Admitting: *Deleted

## 2015-11-08 MED ORDER — ONETOUCH ULTRA SYSTEM W/DEVICE KIT: PACK | Status: DC

## 2015-11-08 MED ORDER — GLUCOSE BLOOD VI STRP: ORAL_STRIP | Status: DC

## 2015-11-08 NOTE — Telephone Encounter (Signed)
Received fax from pharmacy.   Reports that Accuchek is no longer covered by insurance. Insurance will cover One Touch.   Prescription sent to pharmacy.

## 2015-11-12 DIAGNOSIS — J849 Interstitial pulmonary disease, unspecified: Secondary | ICD-10-CM | POA: Diagnosis not present

## 2015-11-12 DIAGNOSIS — J9611 Chronic respiratory failure with hypoxia: Secondary | ICD-10-CM | POA: Diagnosis not present

## 2015-11-15 ENCOUNTER — Ambulatory Visit (INDEPENDENT_AMBULATORY_CARE_PROVIDER_SITE_OTHER): Payer: PPO | Admitting: Family Medicine

## 2015-11-15 ENCOUNTER — Encounter: Payer: Self-pay | Admitting: Family Medicine

## 2015-11-15 VITALS — BP 100/60 | HR 74 | Temp 98.0°F | Resp 20 | Ht 73.0 in | Wt 258.0 lb

## 2015-11-15 DIAGNOSIS — J441 Chronic obstructive pulmonary disease with (acute) exacerbation: Secondary | ICD-10-CM | POA: Diagnosis not present

## 2015-11-15 MED ORDER — PREDNISONE 20 MG PO TABS: 40.0000 mg | ORAL_TABLET | Freq: Every day | ORAL | Status: DC

## 2015-11-15 MED ORDER — LEVOFLOXACIN 500 MG PO TABS: 500.0000 mg | ORAL_TABLET | Freq: Every day | ORAL | Status: DC

## 2015-11-15 NOTE — Progress Notes (Signed)
Subjective:    Patient ID: Jerry Montgomery, male    DOB: [birth year], 60 y.o.   MRN: [id number]  HPI Last week, patient developed an upper respiratory infection. He now has shortness of breath, wheezing, chest congestion, and purulent sputum. Past medical history is significant for COPD, bronchiectasis, and O2 dependent pulmonary fibrosis. He denies any chest pain. He does have some mild pleurisy in the center of his chest. On examination he has diminished breath sounds bilaterally with expiratory wheezes and faint bibasilar crackles Past Medical History  Diagnosis Date  . Hypertension   . Hyperlipidemia   . Postinflammatory pulmonary fibrosis ([place])   . CAD (coronary artery disease)   . Pneumothorax on right 4/11  . COPD (chronic obstructive pulmonary disease) ([place])   . Atrial tachycardia ([place])   . Acute diastolic heart failure ([place])   . On home oxygen therapy     "3L; 24/7" (04/15/2015)  . Sleep apnea     "won't use CPAP" (04/14/2105)  . Hypothyroidism   . Type II diabetes mellitus ([place])   . WPW (Wolff-Parkinson-White syndrome)     Per Dr. Tanna Furry Note  . SVT (supraventricular tachycardia) ([place])     Per Dr. Tanna Furry note  . Atrial fibrillation [facility])    Past Surgical History  Procedure Laterality Date  . Shoulder arthroscopy w/ rotator cuff repair Bilateral   . Bilateral vats ablation Right   . Cardiac catheterization  05/03/2007    patent stents  . Knee surgery  2012    "reattached quad"  . Knee arthroscopy Right 01/22/2013    Procedure: RIGHT ARTHROSCOPY KNEE WITH DEBRIDEMENT, abrasion chondroplasty of lateral tibial plateau, debridement of partial tear of ACL, menisectomy;  Surgeon: Tobi Bastos, MD;  Location: [facility];  Service: Orthopedics;  Laterality: Right;  . Electrophysiologic study N/A 04/14/2015    Procedure: SVT Ablation;  Surgeon: Evans Lance, MD;  Location: [place] [facility];  Service: Cardiovascular;  Laterality: N/A;  . Cardiac catheterization N/A 05/18/2015      Procedure: Right Heart Cath;  Surgeon: Belva Crome, MD;  Location: [place] [facility];  Service: Cardiovascular;  Laterality: N/A;  . Coronary angioplasty with stent placement  03/05/2003; 2008; 01/13/2010    PCI & Stent  LAD & mid CX; stent to CX  (I've got 4 stents total" (04/15/2015)  . Lung lobectomy Right   . Video bronchoscopy N/A 07/19/2015    Procedure: VIDEO BRONCHOSCOPY WITH FLUORO;  Surgeon: Melrose Nakayama, MD;  Location: [place];  Service: Thoracic;  Laterality: N/A;  . Video assisted thoracoscopy Left 07/19/2015    Procedure: LEFT VIDEO ASSISTED THORACOSCOPY WITH LEFT UPPER AND LOWER LUNG BIOPSY;  Surgeon: Melrose Nakayama, MD;  Location: [place];  Service: Thoracic;  Laterality: Left;   Current Outpatient Prescriptions on File Prior to Visit  Medication Sig Dispense Refill  . ADVAIR HFA 115-21 MCG/ACT inhaler Inhale 2 puffs into the lungs 2 (two) times daily.    Marland Kitchen albuterol (PROVENTIL HFA;VENTOLIN HFA) 108 (90 BASE) MCG/ACT inhaler Inhale 2 puffs into the lungs every 6 (six) hours as needed for wheezing. 1 Inhaler 0  . ALPRAZolam (XANAX) 0.5 MG tablet Take 1 tablet (0.5 mg total) by mouth 3 (three) times daily as needed for anxiety. 90 tablet 0  . aspirin 81 MG tablet Take 81 mg by mouth daily.    Marland Kitchen atorvastatin (LIPITOR) 40 MG tablet Take 1 tablet by mouth  every night at bedtime 90 tablet 1  .  B-D ULTRAFINE III SHORT PEN 31G X 8 MM MISC Use as directed 240 each 3  . Blood Glucose Monitoring Suppl (ONE TOUCH ULTRA SYSTEM KIT) w/Device KIT Use to monitor FSBS 4x daily for fluctuating blood sugars. Dx: E11.65. 1 each 1  . clopidogrel (PLAVIX) 75 MG tablet Take 1 tablet by mouth  every day 90 tablet 3  . cyclobenzaprine (FLEXERIL) 10 MG tablet Take 1 tablet (10 mg total) by mouth 3 (three) times daily as needed for muscle spasms. 30 tablet 0  . diltiazem (CARDIZEM) 30 MG tablet Take 1 tablet (30 mg total) by mouth every 6 (six) hours. 360 tablet 3  . furosemide (LASIX) 20 MG  tablet Take 1 tablet (20 mg total) by mouth daily as needed for fluid (fluid). 90 tablet 3  . glucose blood test strip Use to monitor FSBS 4x daily for fluctuating blood sugars. Dx: E11.65. 200 each 11  . Insulin Glargine (LANTUS SOLOSTAR) 100 UNIT/ML Solostar Pen Inject 60 Units into the skin at bedtime. 15 mL 11  . insulin lispro (HUMALOG) 100 UNIT/ML KiwkPen Inject 10units into the skin with breakfast and lunch, and inject 20units into the skin with dinner. (Patient taking differently: Inject 10-30 Units into the skin 3 (three) times daily. Inject 10units into the skin with breakfast and 10units with lunch, and inject 20 or 30units into the skin with dinner depending on A1C level.) 30 mL 11  . isosorbide mononitrate (IMDUR) 60 MG 24 hr tablet Take 1 and 1/2 tablets by  mouth every day (Patient taking differently: Take 60 mg by  mouth every day) 135 tablet 1  . levothyroxine (SYNTHROID, LEVOTHROID) 125 MCG tablet Take 1 tablet (125 mcg total) by mouth daily. 90 tablet 3  . linagliptin (TRADJENTA) 5 MG TABS tablet Take 1 tablet (5 mg total) by mouth daily. 90 tablet 3  . losartan (COZAAR) 100 MG tablet Take 1 tablet (100 mg total) by mouth daily. 90 tablet 3  . Menthol, Topical Analgesic, (BIOFREEZE EX) Apply 1 application topically as needed (for pain).    . metFORMIN (GLUCOPHAGE) 850 MG tablet Take 1 tablet by mouth  twice a day 180 tablet 1  . metoprolol (TOPROL XL) 200 MG 24 hr tablet Take 1 tablet (200 mg total) by mouth daily. 90 tablet 3  . nitroGLYCERIN (NITROSTAT) 0.4 MG SL tablet Place 1 tablet (0.4 mg total) under the tongue every 5 (five) minutes as needed for chest pain. 25 tablet 1  . SYRINGE/NEEDLE, DISP, 1 ML (B-D SYRINGE/NEEDLE 1CC/25GX5/8) 25G X 5/8" 1 ML MISC As directed 100 each 5  . tiotropium (SPIRIVA) 18 MCG inhalation capsule Place 18 mcg into inhaler and inhale daily.      No current facility-administered medications on file prior to visit.   Allergies  Allergen Reactions    . Niaspan [Niacin] Itching  . Altace [Ramipril] Other (See Comments)    Cough    Social History   Social History  . Marital Status: Divorced    Spouse Name: N/A  . Number of Children: 1  . Years of Education: N/A   Occupational History  .  [employer]   Social History Main Topics  . Smoking status: Former Smoker -- 1.50 packs/day for 35 years    Types: Cigarettes    Quit date: 10/24/2007  . Smokeless tobacco: Never Used  . Alcohol Use: 0.0 oz/week    0 Standard drinks or equivalent per week     Comment: 04/15/2015 "I've had a couple  drinks in the last 8 months"  . Drug Use: No  . Sexual Activity: Not Currently   Other Topics Concern  . Not on file   Social History Narrative      Review of Systems  All other systems reviewed and are negative.      Objective:   Physical Exam  Constitutional: He appears well-developed and well-nourished.  HENT:  Right Ear: External ear normal.  Left Ear: External ear normal.  Nose: Nose normal.  Mouth/Throat: Oropharynx is clear and moist.  Eyes: Conjunctivae are normal.  Cardiovascular: Normal rate, regular rhythm and normal heart sounds.   Pulmonary/Chest: No respiratory distress. He has decreased breath sounds. He has wheezes.  Abdominal: Soft. Bowel sounds are normal. He exhibits no distension. There is no tenderness. There is no rebound and no guarding.  Lymphadenopathy:    He has no cervical adenopathy.  Vitals reviewed.         Assessment & Plan:  COPD exacerbation ([place]) - Plan: predniSONE (DELTASONE) 20 MG tablet, levofloxacin (LEVAQUIN) 500 MG tablet  I will treat the patient is COPD  exacerbation. Use prednisone 40 mg a day for 7 days, albuterol 2 puffs inhaled every 6 hours, and Levaquin 500 milligrams by mouth daily for 7 days. I will begin broad-spectrum antibiotics given the fact that the patient has underlying COPD with bronchiectasis and has very little reserve prior to requiring hospitalization

## 2015-11-16 ENCOUNTER — Other Ambulatory Visit: Payer: Self-pay | Admitting: *Deleted

## 2015-11-16 MED ORDER — ATORVASTATIN CALCIUM 40 MG PO TABS: ORAL_TABLET | ORAL | Status: DC

## 2015-11-16 NOTE — Telephone Encounter (Signed)
Received fax requesting refill on Lipitor.   Refill appropriate and filled per protocol. 

## 2015-11-18 ENCOUNTER — Other Ambulatory Visit: Payer: Self-pay | Admitting: *Deleted

## 2015-11-18 MED ORDER — FUROSEMIDE 20 MG PO TABS: 20.0000 mg | ORAL_TABLET | Freq: Every day | ORAL | Status: DC | PRN

## 2015-11-18 NOTE — Telephone Encounter (Signed)
Received fax requesting refill on Lasix.   Refill appropriate and filled per protocol. 

## 2015-11-23 ENCOUNTER — Encounter: Payer: Self-pay | Admitting: Family Medicine

## 2015-11-23 ENCOUNTER — Ambulatory Visit (INDEPENDENT_AMBULATORY_CARE_PROVIDER_SITE_OTHER): Payer: PPO | Admitting: Family Medicine

## 2015-11-23 VITALS — BP 88/58 | HR 64 | Temp 97.4°F | Resp 20 | Wt 254.0 lb

## 2015-11-23 DIAGNOSIS — R0600 Dyspnea, unspecified: Secondary | ICD-10-CM

## 2015-11-23 MED ORDER — AZITHROMYCIN 250 MG PO TABS: ORAL_TABLET | ORAL | Status: DC

## 2015-11-23 MED ORDER — PREDNISONE 20 MG PO TABS: 60.0000 mg | ORAL_TABLET | Freq: Every day | ORAL | Status: DC

## 2015-11-23 NOTE — Progress Notes (Signed)
Subjective:    Patient ID: Jerry Montgomery, male    DOB: 02/25/1956, 60 y.o.   MRN: [id number]  HPI 11/15/15 Last week, patient developed an upper respiratory infection. He now has shortness of breath, wheezing, chest congestion, and purulent sputum. Past medical history is significant for COPD, bronchiectasis, and O2 dependent pulmonary fibrosis. He denies any chest pain. He does have some mild pleurisy in the center of his chest. On examination he has diminished breath sounds bilaterally with expiratory wheezes and faint bibasilar crackles.  At that time, my plan was: I will treat the patient is COPD  exacerbation. Use prednisone 40 mg a day for 7 days, albuterol 2 puffs inhaled every 6 hours, and Levaquin 500 milligrams by mouth daily for 7 days. I will begin broad-spectrum antibiotics given the fact that the patient has underlying COPD with bronchiectasis and has very little reserve prior to requiring hospitalization   11/23/15  patient is here today for recheck. Past medical history significant for COPD along with pulmonary fibrosis. He is on chronic oxygen for hypoxia. Since I last saw the patient his weight is down approximately 4 pounds. His blood pressure has also dropped to 88/58. He reports feeling weak and tired but denies any lightheadedness or presyncope. His cough is not any better. He continues to complain of feeling short of breath. He also reports some right-sided pleurisy. On examination today his lungs are clearer than they were last time. I do not appreciate any wheezes or crackles. He continues to have diminished breath sounds however bilaterally which makes examination limited. He has no peripheral edema and no JVD. Past Medical History  Diagnosis Date  . Hypertension   . Hyperlipidemia   . Postinflammatory pulmonary fibrosis ([place])   . CAD (coronary artery disease)   . Pneumothorax on right 4/11  . COPD (chronic obstructive pulmonary disease) ([place])   . Atrial tachycardia  ([place])   . Acute diastolic heart failure ([place])   . On home oxygen therapy     "3L; 24/7" (04/15/2015)  . Sleep apnea     "won't use CPAP" (04/14/2105)  . Hypothyroidism   . Type II diabetes mellitus ([place])   . WPW (Wolff-Parkinson-White syndrome)     Per Dr. Tanna Furry Note  . SVT (supraventricular tachycardia) ([place])     Per Dr. Tanna Furry note  . Atrial fibrillation [facility])    Past Surgical History  Procedure Laterality Date  . Shoulder arthroscopy w/ rotator cuff repair Bilateral   . Bilateral vats ablation Right   . Cardiac catheterization  05/03/2007    patent stents  . Knee surgery  2012    "reattached quad"  . Knee arthroscopy Right 01/22/2013    Procedure: RIGHT ARTHROSCOPY KNEE WITH DEBRIDEMENT, abrasion chondroplasty of lateral tibial plateau, debridement of partial tear of ACL, menisectomy;  Surgeon: Tobi Bastos, MD;  Location: [facility];  Service: Orthopedics;  Laterality: Right;  . Electrophysiologic study N/A 04/14/2015    Procedure: SVT Ablation;  Surgeon: Evans Lance, MD;  Location: [place] [facility];  Service: Cardiovascular;  Laterality: N/A;  . Cardiac catheterization N/A 05/18/2015    Procedure: Right Heart Cath;  Surgeon: Belva Crome, MD;  Location: [place] [facility];  Service: Cardiovascular;  Laterality: N/A;  . Coronary angioplasty with stent placement  03/05/2003; 2008; 01/13/2010    PCI & Stent  LAD & mid CX; stent to CX  (I've got 4 stents total" (04/15/2015)  . Lung lobectomy Right   .  Video bronchoscopy N/A 07/19/2015    Procedure: VIDEO BRONCHOSCOPY WITH FLUORO;  Surgeon: Melrose Nakayama, MD;  Location: [place];  Service: Thoracic;  Laterality: N/A;  . Video assisted thoracoscopy Left 07/19/2015    Procedure: LEFT VIDEO ASSISTED THORACOSCOPY WITH LEFT UPPER AND LOWER LUNG BIOPSY;  Surgeon: Melrose Nakayama, MD;  Location: [place];  Service: Thoracic;  Laterality: Left;   Current Outpatient Prescriptions on File Prior to Visit  Medication Sig Dispense Refill   . ADVAIR HFA 115-21 MCG/ACT inhaler Inhale 2 puffs into the lungs 2 (two) times daily.    Marland Kitchen albuterol (PROVENTIL HFA;VENTOLIN HFA) 108 (90 BASE) MCG/ACT inhaler Inhale 2 puffs into the lungs every 6 (six) hours as needed for wheezing. 1 Inhaler 0  . ALPRAZolam (XANAX) 0.5 MG tablet Take 1 tablet (0.5 mg total) by mouth 3 (three) times daily as needed for anxiety. 90 tablet 0  . aspirin 81 MG tablet Take 81 mg by mouth daily.    Marland Kitchen atorvastatin (LIPITOR) 40 MG tablet Take 1 tablet by mouth  every night at bedtime 90 tablet 1  . B-D ULTRAFINE III SHORT PEN 31G X 8 MM MISC Use as directed 240 each 3  . Blood Glucose Monitoring Suppl (ONE TOUCH ULTRA SYSTEM KIT) w/Device KIT Use to monitor FSBS 4x daily for fluctuating blood sugars. Dx: E11.65. 1 each 1  . clopidogrel (PLAVIX) 75 MG tablet Take 1 tablet by mouth  every day 90 tablet 3  . cyclobenzaprine (FLEXERIL) 10 MG tablet Take 1 tablet (10 mg total) by mouth 3 (three) times daily as needed for muscle spasms. 30 tablet 0  . diltiazem (CARDIZEM) 30 MG tablet Take 1 tablet (30 mg total) by mouth every 6 (six) hours. 360 tablet 3  . furosemide (LASIX) 20 MG tablet Take 1 tablet (20 mg total) by mouth daily as needed for fluid (fluid). 90 tablet 3  . glucose blood test strip Use to monitor FSBS 4x daily for fluctuating blood sugars. Dx: E11.65. 200 each 11  . Insulin Glargine (LANTUS SOLOSTAR) 100 UNIT/ML Solostar Pen Inject 60 Units into the skin at bedtime. 15 mL 11  . insulin lispro (HUMALOG) 100 UNIT/ML KiwkPen Inject 10units into the skin with breakfast and lunch, and inject 20units into the skin with dinner. (Patient taking differently: Inject 10-30 Units into the skin 3 (three) times daily. Inject 10units into the skin with breakfast and 10units with lunch, and inject 20 or 30units into the skin with dinner depending on A1C level.) 30 mL 11  . isosorbide mononitrate (IMDUR) 60 MG 24 hr tablet Take 1 and 1/2 tablets by  mouth every day (Patient  taking differently: Take 60 mg by  mouth every day) 135 tablet 1  . levofloxacin (LEVAQUIN) 500 MG tablet Take 1 tablet (500 mg total) by mouth daily. 7 tablet 0  . levothyroxine (SYNTHROID, LEVOTHROID) 125 MCG tablet Take 1 tablet (125 mcg total) by mouth daily. 90 tablet 3  . linagliptin (TRADJENTA) 5 MG TABS tablet Take 1 tablet (5 mg total) by mouth daily. 90 tablet 3  . losartan (COZAAR) 100 MG tablet Take 1 tablet (100 mg total) by mouth daily. 90 tablet 3  . Menthol, Topical Analgesic, (BIOFREEZE EX) Apply 1 application topically as needed (for pain).    . metFORMIN (GLUCOPHAGE) 850 MG tablet Take 1 tablet by mouth  twice a day 180 tablet 1  . metoprolol (TOPROL XL) 200 MG 24 hr tablet Take 1 tablet (200 mg total) by  mouth daily. 90 tablet 3  . nitroGLYCERIN (NITROSTAT) 0.4 MG SL tablet Place 1 tablet (0.4 mg total) under the tongue every 5 (five) minutes as needed for chest pain. 25 tablet 1  . predniSONE (DELTASONE) 20 MG tablet Take 2 tablets (40 mg total) by mouth daily with breakfast. 14 tablet 0  . SYRINGE/NEEDLE, DISP, 1 ML (B-D SYRINGE/NEEDLE 1CC/25GX5/8) 25G X 5/8" 1 ML MISC As directed 100 each 5  . tiotropium (SPIRIVA) 18 MCG inhalation capsule Place 18 mcg into inhaler and inhale daily.      No current facility-administered medications on file prior to visit.   Allergies  Allergen Reactions  . Niaspan [Niacin] Itching  . Altace [Ramipril] Other (See Comments)    Cough    Social History   Social History  . Marital Status: Divorced    Spouse Name: N/A  . Number of Children: 1  . Years of Education: N/A   Occupational History  .  [employer]   Social History Main Topics  . Smoking status: Former Smoker -- 1.50 packs/day for 35 years    Types: Cigarettes    Quit date: 10/24/2007  . Smokeless tobacco: Never Used  . Alcohol Use: 0.0 oz/week    0 Standard drinks or equivalent per week     Comment: 04/15/2015 "I've had a couple drinks in the last 8 months"  .  Drug Use: No  . Sexual Activity: Not Currently   Other Topics Concern  . Not on file   Social History Narrative      Review of Systems  All other systems reviewed and are negative.      Objective:   Physical Exam  Constitutional: He appears well-developed and well-nourished.  HENT:  Right Ear: External ear normal.  Left Ear: External ear normal.  Nose: Nose normal.  Mouth/Throat: Oropharynx is clear and moist.  Eyes: Conjunctivae are normal.  Cardiovascular: Normal rate, regular rhythm and normal heart sounds.   Pulmonary/Chest: No respiratory distress. He has decreased breath sounds. He has no wheezes. He has no rales.  Abdominal: Soft. Bowel sounds are normal. He exhibits no distension. There is no tenderness. There is no rebound and no guarding.  Musculoskeletal: He exhibits no edema.  Lymphadenopathy:    He has no cervical adenopathy.  Vitals reviewed.         Assessment & Plan:  Dyspnea - Plan: DG Chest 2 View, CBC with Differential/Platelet, azithromycin (ZITHROMAX) 250 MG tablet, predniSONE (DELTASONE) 20 MG tablet   patient is not much better. I would like to obtain a chest x-ray to rule out pulmonary edema or occult pneumonia. I will check a CBC to rule out anemia particular given the patient's falling blood pressure and his worsening shortness of breath. I will cover atypical infections with azithromycin. Given his pulmonary fibrosis I will increase his prednisone dosage to 60 mg a day for the next 5 days. I would like to recheck the patient on Friday sooner if he is getting worse. I do not believe he is developing sepsis. I believe his blood pressures dropping because he is losing weight and becoming dehydrated. Therefore I will have the patient temporarily discontinue losartan until his oral intake improves. If symptoms continue or worsen he may need to go to the hospital.

## 2015-11-24 ENCOUNTER — Ambulatory Visit
Admission: RE | Admit: 2015-11-24 | Discharge: 2015-11-24 | Disposition: A | Discharge status: Home / Self Care | Payer: PPO | Source: Ambulatory Visit | Attending: Family Medicine | Admitting: Family Medicine

## 2015-11-24 ENCOUNTER — Other Ambulatory Visit: Payer: Self-pay | Admitting: Family Medicine

## 2015-11-24 DIAGNOSIS — R0600 Dyspnea, unspecified: Secondary | ICD-10-CM

## 2015-11-24 DIAGNOSIS — R05 Cough: Secondary | ICD-10-CM | POA: Diagnosis not present

## 2015-11-24 DIAGNOSIS — R0602 Shortness of breath: Secondary | ICD-10-CM | POA: Diagnosis not present

## 2015-11-24 LAB — CBC WITH DIFFERENTIAL/PLATELET
Basophils Absolute: 0.3 K/uL — ABNORMAL HIGH (ref 0.0–0.1)
Basophils Relative: 2 % — ABNORMAL HIGH (ref 0–1)
Eosinophils Absolute: 0.3 K/uL (ref 0.0–0.7)
Eosinophils Relative: 2 % (ref 0–5)
HCT: 43.6 % (ref 39.0–52.0)
Hemoglobin: 14.6 g/dL (ref 13.0–17.0)
Lymphocytes Relative: 17 % (ref 12–46)
Lymphs Abs: 2.3 K/uL (ref 0.7–4.0)
MCH: 30.8 pg (ref 26.0–34.0)
MCHC: 33.5 g/dL (ref 30.0–36.0)
MCV: 92 fL (ref 78.0–100.0)
MPV: 9.6 fL (ref 8.6–12.4)
Monocytes Absolute: 1.2 K/uL — ABNORMAL HIGH (ref 0.1–1.0)
Monocytes Relative: 9 % (ref 3–12)
Neutro Abs: 9.5 K/uL — ABNORMAL HIGH (ref 1.7–7.7)
Neutrophils Relative %: 70 % (ref 43–77)
Platelets: 229 K/uL (ref 150–400)
RBC: 4.74 MIL/uL (ref 4.22–5.81)
RDW: 15 % (ref 11.5–15.5)
WBC: 13.5 K/uL — ABNORMAL HIGH (ref 4.0–10.5)

## 2015-11-24 MED ORDER — BLOOD GLUCOSE MONITOR KIT: PACK | Status: DC

## 2015-11-24 NOTE — Telephone Encounter (Signed)
New Rx for glucometer sent to pharmacy

## 2015-12-12 ENCOUNTER — Telehealth: Payer: Self-pay | Admitting: Internal Medicine

## 2015-12-12 NOTE — Telephone Encounter (Signed)
Elise  pls change him THALES KNIPPLE to the newly opened slots 12/27/15 - I am likely going for meeting 01/17/16. LEt me know once changed  Thanks  Dr. Kalman Shan, M.D., [facility].C.P Pulmonary and Critical Care Medicine Staff Physician [facility] Pager: [phone], If no answer or between  15:00h - 7:00h: call [phone]  12/12/2015 7:33 AM

## 2015-12-13 DIAGNOSIS — J9611 Chronic respiratory failure with hypoxia: Secondary | ICD-10-CM | POA: Diagnosis not present

## 2015-12-13 DIAGNOSIS — J849 Interstitial pulmonary disease, unspecified: Secondary | ICD-10-CM | POA: Diagnosis not present

## 2015-12-14 NOTE — Telephone Encounter (Signed)
Schedule is still not available

## 2015-12-15 ENCOUNTER — Telehealth: Payer: Self-pay | Admitting: Family Medicine

## 2015-12-15 NOTE — Telephone Encounter (Signed)
Called and spoke to pt. Appt made with MR on 12/27/15. Pt verbalized understanding and denied any further questions or concerns at this time.

## 2015-12-15 NOTE — Telephone Encounter (Signed)
Requesting a refill on Ambien  - ? OK to Refill   Fax to [facility] - [phone]

## 2015-12-16 ENCOUNTER — Other Ambulatory Visit: Payer: Self-pay | Admitting: Family Medicine

## 2015-12-16 MED ORDER — ZOLPIDEM TARTRATE 10 MG PO TABS: 10.0000 mg | ORAL_TABLET | Freq: Every evening | ORAL | Status: DC | PRN

## 2015-12-16 NOTE — Telephone Encounter (Signed)
Med faxed to [facility]

## 2015-12-16 NOTE — Telephone Encounter (Signed)
Error duplicate request

## 2015-12-16 NOTE — Telephone Encounter (Signed)
ok 

## 2015-12-24 ENCOUNTER — Other Ambulatory Visit: Payer: Self-pay | Admitting: Family Medicine

## 2015-12-24 MED ORDER — GLUCOSE BLOOD VI STRP: ORAL_STRIP | Status: DC

## 2015-12-24 NOTE — Telephone Encounter (Signed)
Medication called/sent to requested pharmacy  

## 2015-12-27 ENCOUNTER — Ambulatory Visit (INDEPENDENT_AMBULATORY_CARE_PROVIDER_SITE_OTHER): Payer: PPO | Admitting: Internal Medicine

## 2015-12-27 ENCOUNTER — Encounter: Payer: Self-pay | Admitting: Internal Medicine

## 2015-12-27 VITALS — BP 118/72 | HR 56 | Ht 73.0 in | Wt 253.6 lb

## 2015-12-27 DIAGNOSIS — J9611 Chronic respiratory failure with hypoxia: Secondary | ICD-10-CM

## 2015-12-27 DIAGNOSIS — J84112 Idiopathic pulmonary fibrosis: Secondary | ICD-10-CM

## 2015-12-27 NOTE — Patient Instructions (Addendum)
ICD-9-CM ICD-10-CM   1. Chronic respiratory failure with hypoxia (HCC) 518.83 J96.11    799.02    2. IPF (idiopathic pulmonary fibrosis) (HCC) 516.31 J84.112     IPF appears stable Will list both here anti-fibrotic agents in the allergy list with the allergy being fatigued  Plan - Continue oxygen as before - Refer [facility] transplant team - [facility] - Refer [employer] research for participation in IPF registry study sponsored by [facility] - Respect your decision to decline pulmonary fibrosis foundation support group  Followup  - spiro  + dlco in 3months - ROV in 3 months

## 2015-12-27 NOTE — Progress Notes (Addendum)
Patient ID: Jerry Montgomery, male    DOB: [birth year], 60 y.o.   MRN: [id number]   60 year old male former smoker with hx of ILD  Hx of PTX s/p VATS pleurodesis in 2013.   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 ANA autoimmune panel was negative, with trial of Esbriet but unable to tolerate.    02/2015  [employer] ab panel 03/09/15 and HP panel - all negative. C-anca trace positive but mpo/pr3 negative  PFT fvc 3.5L/64%, Ratio 72, TLC 6L/76%, DLCO 15/39% - moderate restriction. (Worse to stable  compared to 2013 when FVC was 4.1 L/78% and TLC was 6 L DLCO was 16/54%]  CT chest 03/18/2015  repirted 03/24/2015 i sAGAINST UIP and is worse compared to 2014. Other findings on CT - enlarged PA artery - echo 2014 showed PASP 40s and co art calcification - stress lexiscan 2014 was normal.   Right heart catheterization done 05/18/2015  - Report reviewed personally cardiac output 5.84 L/m. Cardiac index 2.45 L/m per BSA, pulmonary arteries with mean pressure 18 mm and a wedge of 7. Reported as normal   Walk test 185 feet 3 laps on room air  - WE left him on room air for 20 minutes and then walked him 185 feet 3 laps. He desaturated only on the second lap to below 88%.  10/14/2015  Follow up : UIP /IPF .  Pt returns for 1 month follow up. Pt states he has not took OFEV x 3 weeks.   Says insurance does not cover .discussed looking in to grants, patient assitance to help with cost.   Says he is not going to take it because it makes him feel so bad. Has extreme fatigue. This resolves when he stops med.  Previously similar symptoms with Esbriet.  We discussed his dz state and potential for progression.  He says he wants quality of life and not feel bad all the time.  He remains on Oxygen 3l/m rest and 4l/m bedtime .  Has follow up with Dr. Roxan Hockey this evening with planned cxr .  He does okay at rest but gets winded with  activity . Has some nasal congestion, blew out some blood but  No nose bleeds.  Remains on Advair an   OV 12/27/2015  Chief Complaint  Patient presents with  . Follow-up    Pt states since stopping ofev in December he had a cold that lasted several weeks. Pt states he is now feeling well. Pt states he has a seldom cough and pt's SOB is unchanged since last OV.    Follow-up idiopathic pulmonary fibrosis diagnosis confirmed on biopsy 07/16/2015 (correction done 04/28/2016 -> date of bx was 07/18/16 and we informed him on phone 07/22/15). He presents for follow-up. He started ofev end of last and give up due to fatigue. He feels better without antibiotics fibrotic therapy. 3-4 weeks ago he had flu like illness was treated with antibiotics and prednisone. He is now back to baseline. He continues with his oxygen. He is interested in pulmonary rehabilitation and [facility] lung transplant referral. He is agreeable to participate in a registry IPF trial. He is not interested in former fibrosis foundation support group  Last PFT was in May 2016 and he is in need of follow-up      Current outpatient prescriptions:  .  ADVAIR HFA 115-21 MCG/ACT inhaler, Inhale 2 puffs into the lungs 2 (two) times  daily., Disp: , Rfl:  .  albuterol (PROVENTIL HFA;VENTOLIN HFA) 108 (90 BASE) MCG/ACT inhaler, Inhale 2 puffs into the lungs every 6 (six) hours as needed for wheezing., Disp: 1 Inhaler, Rfl: 0 .  ALPRAZolam (XANAX) 0.5 MG tablet, Take 1 tablet (0.5 mg total) by mouth 3 (three) times daily as needed for anxiety., Disp: 90 tablet, Rfl: 0 .  aspirin 81 MG tablet, Take 81 mg by mouth daily., Disp: , Rfl:  .  atorvastatin (LIPITOR) 40 MG tablet, Take 1 tablet by mouth  every night at bedtime, Disp: 90 tablet, Rfl: 1 .  B-D ULTRAFINE III SHORT PEN 31G X 8 MM MISC, Use as directed, Disp: 240 each, Rfl: 3 .  blood glucose meter kit and supplies KIT, Dispense based on patient and insurance preference. Use up to four  times daily as directed. (FOR ICD E11.65), Disp: 1 each, Rfl: 0 .  Blood Glucose Monitoring Suppl (ONE TOUCH ULTRA SYSTEM KIT) w/Device KIT, Use to monitor FSBS 4x daily for fluctuating blood sugars. Dx: E11.65., Disp: 1 each, Rfl: 1 .  clopidogrel (PLAVIX) 75 MG tablet, Take 1 tablet by mouth  every day, Disp: 90 tablet, Rfl: 3 .  cyclobenzaprine (FLEXERIL) 10 MG tablet, Take 1 tablet (10 mg total) by mouth 3 (three) times daily as needed for muscle spasms., Disp: 30 tablet, Rfl: 0 .  diltiazem (CARDIZEM) 30 MG tablet, Take 1 tablet (30 mg total) by mouth every 6 (six) hours., Disp: 360 tablet, Rfl: 3 .  furosemide (LASIX) 20 MG tablet, Take 1 tablet (20 mg total) by mouth daily as needed for fluid (fluid)., Disp: 90 tablet, Rfl: 3 .  glucose blood test strip, Use to monitor FSBS 4x daily for fluctuating blood sugars. Dx: E11.65., Disp: 400 each, Rfl: 4 .  Insulin Glargine (LANTUS SOLOSTAR) 100 UNIT/ML Solostar Pen, Inject 60 Units into the skin at bedtime., Disp: 15 mL, Rfl: 11 .  insulin lispro (HUMALOG) 100 UNIT/ML KiwkPen, Inject 10units into the skin with breakfast and lunch, and inject 20units into the skin with dinner. (Patient taking differently: Inject 10-30 Units into the skin 3 (three) times daily. Inject 10units into the skin with breakfast and 10units with lunch, and inject 20 or 30units into the skin with dinner depending on A1C level.), Disp: 30 mL, Rfl: 11 .  isosorbide mononitrate (IMDUR) 60 MG 24 hr tablet, Take 1 and 1/2 tablets by  mouth every day (Patient taking differently: Take 60 mg by  mouth every day), Disp: 135 tablet, Rfl: 1 .  levothyroxine (SYNTHROID, LEVOTHROID) 125 MCG tablet, Take 1 tablet (125 mcg total) by mouth daily., Disp: 90 tablet, Rfl: 3 .  linagliptin (TRADJENTA) 5 MG TABS tablet, Take 1 tablet (5 mg total) by mouth daily., Disp: 90 tablet, Rfl: 3 .  Menthol, Topical Analgesic, (BIOFREEZE EX), Apply 1 application topically as needed (for pain)., Disp: , Rfl:  .   metFORMIN (GLUCOPHAGE) 850 MG tablet, Take 1 tablet by mouth  twice a day, Disp: 180 tablet, Rfl: 1 .  metoprolol (TOPROL XL) 200 MG 24 hr tablet, Take 1 tablet (200 mg total) by mouth daily., Disp: 90 tablet, Rfl: 3 .  nitroGLYCERIN (NITROSTAT) 0.4 MG SL tablet, Place 1 tablet (0.4 mg total) under the tongue every 5 (five) minutes as needed for chest pain., Disp: 25 tablet, Rfl: 1 .  SYRINGE/NEEDLE, DISP, 1 ML (B-D SYRINGE/NEEDLE 1CC/25GX5/8) 25G X 5/8" 1 ML MISC, As directed, Disp: 100 each, Rfl: 5 .  tiotropium (SPIRIVA) 18  MCG inhalation capsule, Place 18 mcg into inhaler and inhale daily. , Disp: , Rfl:  .  zolpidem (AMBIEN) 10 MG tablet, Take 1 tablet (10 mg total) by mouth at bedtime as needed for sleep., Disp: 90 tablet, Rfl: 1   OVJECTIVE     A/P    ICD-9-CM ICD-10-CM   1. Chronic respiratory failure with hypoxia (HCC) 518.83 J96.11    799.02    2. IPF (idiopathic pulmonary fibrosis) (HCC) 516.31 J84.112       IPF appears stable Will list both here anti-fibrotic agents in the allergy list with the allergy being fatigued We discussed the following and your decisions on these  Plan - Continue oxygen as before - Refer [facility] lung transplant team - will need weigh tloss - Refer [facility] pulmonary rehabilitation program - Refer [employer] research for participation in IPF registry study sponsored by [facility] - Respect your decision to decline pulmonary fibrosis foundation support group  Followup  - spiro  + dlco in 46month - ROV in 3 months  > 50% of this > 25 min visit spent in face to face counseling or coordination of care    Dr. MBrand Males M.D., [facility].P Pulmonary and Critical Care Medicine Staff Physician [facility] Pager: [phone] If no answer or between  15:00h - 7:00h: call [phone]  12/27/2015 10:58 AM

## 2015-12-31 ENCOUNTER — Other Ambulatory Visit: Payer: Self-pay | Admitting: *Deleted

## 2015-12-31 MED ORDER — DILTIAZEM HCL 30 MG PO TABS: 30.0000 mg | ORAL_TABLET | Freq: Four times a day (QID) | ORAL | Status: DC

## 2016-01-04 ENCOUNTER — Other Ambulatory Visit: Payer: Self-pay | Admitting: *Deleted

## 2016-01-04 MED ORDER — ONETOUCH ULTRA SYSTEM W/DEVICE KIT: PACK | Status: DC

## 2016-01-04 NOTE — Telephone Encounter (Signed)
Received fax requesting refill on DM supplies.   Refill appropriate and filled per protocol.  

## 2016-01-06 ENCOUNTER — Telehealth: Payer: Self-pay | Admitting: Family Medicine

## 2016-01-06 MED ORDER — INSULIN PEN NEEDLE 31G X 8 MM MISC: Status: DC

## 2016-01-06 MED ORDER — "SYRINGE/NEEDLE (DISP) 25G X 5/8"" 1 ML MISC": Status: DC

## 2016-01-06 MED ORDER — INSULIN ASPART 100 UNIT/ML ~~LOC~~ SOLN: SUBCUTANEOUS | Status: DC

## 2016-01-06 NOTE — Telephone Encounter (Signed)
Pt needs his insulin switched from humalog to novolog in the vials - He uses 10u in am and lunch and 30 u qhs unless on prednisone then his dose is increased. Also need needles and pen needles for his Lantus pen. All sent to pharm as requested by pt.

## 2016-01-06 NOTE — Telephone Encounter (Signed)
Patient would like to speak to you regarding his insulin  [phone]

## 2016-01-07 ENCOUNTER — Encounter (HOSPITAL_COMMUNITY)
Admission: RE | Admit: 2016-01-07 | Discharge: 2016-01-07 | Disposition: A | Discharge status: Home / Self Care | Payer: PPO | Source: Ambulatory Visit | Attending: Internal Medicine | Admitting: Internal Medicine

## 2016-01-07 ENCOUNTER — Encounter (HOSPITAL_COMMUNITY): Payer: Self-pay

## 2016-01-07 VITALS — BP 150/80 | HR 68 | Resp 18 | Ht 73.0 in | Wt 254.4 lb

## 2016-01-07 DIAGNOSIS — J84112 Idiopathic pulmonary fibrosis: Secondary | ICD-10-CM

## 2016-01-07 NOTE — Progress Notes (Signed)
Pulmonary Individual Treatment Plan  Patient Details  Name: Jerry Montgomery MRN: [id number] Date of Birth: 01-13-1956 Referring Provider:  Brand Males, MD  Initial Encounter Date:   Visit Diagnosis: IPF (idiopathic pulmonary fibrosis) ([place])  Patient's Home Medications on Admission:   Current outpatient prescriptions:  .  ADVAIR HFA 115-21 MCG/ACT inhaler, Inhale 2 puffs into the lungs 2 (two) times daily., Disp: , Rfl:  .  albuterol (PROVENTIL HFA;VENTOLIN HFA) 108 (90 BASE) MCG/ACT inhaler, Inhale 2 puffs into the lungs every 6 (six) hours as needed for wheezing., Disp: 1 Inhaler, Rfl: 0 .  ALPRAZolam (XANAX) 0.5 MG tablet, Take 1 tablet (0.5 mg total) by mouth 3 (three) times daily as needed for anxiety., Disp: 90 tablet, Rfl: 0 .  aspirin 81 MG tablet, Take 81 mg by mouth daily., Disp: , Rfl:  .  atorvastatin (LIPITOR) 40 MG tablet, Take 1 tablet by mouth  every night at bedtime, Disp: 90 tablet, Rfl: 1 .  blood glucose meter kit and supplies KIT, Dispense based on patient and insurance preference. Use up to four times daily as directed. (FOR ICD E11.65), Disp: 1 each, Rfl: 0 .  Blood Glucose Monitoring Suppl (ONE TOUCH ULTRA SYSTEM KIT) w/Device KIT, Use to monitor FSBS 4x daily for fluctuating blood sugars. Dx: E11.65., Disp: 1 each, Rfl: 1 .  clopidogrel (PLAVIX) 75 MG tablet, Take 1 tablet by mouth  every day, Disp: 90 tablet, Rfl: 3 .  cyclobenzaprine (FLEXERIL) 10 MG tablet, Take 1 tablet (10 mg total) by mouth 3 (three) times daily as needed for muscle spasms., Disp: 30 tablet, Rfl: 0 .  diltiazem (CARDIZEM) 30 MG tablet, Take 1 tablet (30 mg total) by mouth every 6 (six) hours., Disp: 360 tablet, Rfl: 1 .  furosemide (LASIX) 20 MG tablet, Take 1 tablet (20 mg total) by mouth daily as needed for fluid (fluid)., Disp: 90 tablet, Rfl: 3 .  glucose blood test strip, Use to monitor FSBS 4x daily for fluctuating blood sugars. Dx: E11.65., Disp: 400 each, Rfl: 4 .  insulin aspart  (NOVOLOG) 100 UNIT/ML injection, 10 UNITS WITH BREAKFAST AND 10 UNITS WITH LUNCH, 30 UNITS WITH SUPPER, Disp: 60 mL, Rfl: 4 .  Insulin Glargine (LANTUS SOLOSTAR) 100 UNIT/ML Solostar Pen, Inject 60 Units into the skin at bedtime., Disp: 15 mL, Rfl: 11 .  Insulin Pen Needle (B-D ULTRAFINE III SHORT PEN) 31G X 8 MM MISC, Use as directed, Disp: 200 each, Rfl: 3 .  isosorbide mononitrate (IMDUR) 60 MG 24 hr tablet, Take 1 and 1/2 tablets by  mouth every day (Patient taking differently: Take 60 mg by  mouth every day), Disp: 135 tablet, Rfl: 1 .  levothyroxine (SYNTHROID, LEVOTHROID) 125 MCG tablet, Take 1 tablet (125 mcg total) by mouth daily., Disp: 90 tablet, Rfl: 3 .  linagliptin (TRADJENTA) 5 MG TABS tablet, Take 1 tablet (5 mg total) by mouth daily., Disp: 90 tablet, Rfl: 3 .  Menthol, Topical Analgesic, (BIOFREEZE EX), Apply 1 application topically as needed (for pain)., Disp: , Rfl:  .  metFORMIN (GLUCOPHAGE) 850 MG tablet, Take 1 tablet by mouth  twice a day, Disp: 180 tablet, Rfl: 1 .  metoprolol (TOPROL XL) 200 MG 24 hr tablet, Take 1 tablet (200 mg total) by mouth daily., Disp: 90 tablet, Rfl: 3 .  nitroGLYCERIN (NITROSTAT) 0.4 MG SL tablet, Place 1 tablet (0.4 mg total) under the tongue every 5 (five) minutes as needed for chest pain., Disp: 25 tablet, Rfl: 1 .  SYRINGE/NEEDLE, DISP, 1 ML (B-D SYRINGE/NEEDLE 1CC/25GX5/8) 25G X 5/8" 1 ML MISC, As directed, Disp: 100 each, Rfl: 5 .  tiotropium (SPIRIVA) 18 MCG inhalation capsule, Place 18 mcg into inhaler and inhale daily. , Disp: , Rfl:  .  zolpidem (AMBIEN) 10 MG tablet, Take 1 tablet (10 mg total) by mouth at bedtime as needed for sleep., Disp: 90 tablet, Rfl: 1 .  insulin lispro (HUMALOG) 100 UNIT/ML KiwkPen, Inject 10units into the skin with breakfast and lunch, and inject 20units into the skin with dinner. (Patient not taking: Reported on 01/07/2016), Disp: 30 mL, Rfl: 11  Past Medical History: Past Medical History  Diagnosis Date  .  Hypertension   . Hyperlipidemia   . Postinflammatory pulmonary fibrosis ([place])   . CAD (coronary artery disease)   . Pneumothorax on right 4/11  . COPD (chronic obstructive pulmonary disease) ([place])   . Atrial tachycardia ([place])   . Acute diastolic heart failure ([place])   . On home oxygen therapy     "3L; 24/7" (04/15/2015)  . Sleep apnea     "won't use CPAP" (04/14/2105)  . Hypothyroidism   . Type II diabetes mellitus ([place])   . WPW (Wolff-Parkinson-White syndrome)     Per Dr. Tanna Furry Note  . SVT (supraventricular tachycardia) ([place])     Per Dr. Tanna Furry note  . Atrial fibrillation ([facility])     Tobacco Use: History  Smoking status  . Former Smoker -- 1.50 packs/day for 35 years  . Types: Cigarettes  . Quit date: 10/24/2007  Smokeless tobacco  . Never Used    Labs:     Recent Review Flowsheet Data    Labs for ITP Cardiac and Pulmonary Rehab Latest Ref Rng 05/18/2015 07/06/2015 07/15/2015 07/20/2015 07/22/2015   Cholestrol 125 - 200 mg/dL - 131 - - -   LDLCALC <130 mg/dL - 62 - - -   HDL >=40 mg/dL - 33(L) - - -   Trlycerides <150 mg/dL - 181(H) - - -   Hemoglobin A1c <5.7 % - 8.6(H) - - -   PHART 7.350 - 7.450 - - 7.410 7.355 7.467(H)   PCO2ART 35.0 - 45.0 mmHg - - 38.0 46.0(H) 33.4(L)   HCO3 20.0 - 24.0 mEq/L 28.3(H) - 23.6 25.2(H) 23.9   TCO2 0 - 100 mmol/L 30 - 24.8 26.6 24.9   ACIDBASEDEF 0.0 - 2.0 mmol/L - - 0.4 - -   O2SAT - 69.0 - 97.2 93.1 95.6      Capillary Blood Glucose: Lab Results  Component Value Date   GLUCAP 182* 07/23/2015   GLUCAP 132* 07/23/2015   GLUCAP 138* 07/22/2015   GLUCAP 246* 07/22/2015   GLUCAP 185* 07/22/2015     ADL UCSD:     ADL UCSD      01/11/16 1412       ADL UCSD   SOB Score total 31        Pulmonary Function Assessment:     Pulmonary Function Assessment - 01/07/16 1111    Breath   Bilateral Breath Sounds Other   Other fine crackles in bases   Shortness of Breath Yes;Limiting activity      Exercise Target Goals:     Exercise Program Goal: Individual exercise prescription set with THRR, safety & activity barriers. Participant demonstrates ability to understand and report RPE using BORG scale, to self-measure pulse accurately, and to acknowledge the importance of the exercise prescription.  Exercise Prescription Goal: Starting with aerobic activity 30 plus minutes a day, 3  days per week for initial exercise prescription. Provide home exercise prescription and guidelines that participant acknowledges understanding prior to discharge.  Activity Barriers & Risk Stratification:     Activity Barriers & Cardiac Risk Stratification - 01/07/16 1110    Activity Barriers & Cardiac Risk Stratification   Activity Barriers Right Knee Replacement;Balance Concerns;Deconditioning;Joint Problems;Shortness of Breath      6 Minute Walk:     6 Minute Walk      01/11/16 1634       6 Minute Walk   Phase Initial     Distance 1072 feet     Walk Time 4.48 minutes     # of Rest Breaks 1     MPH 2.72     METS 2.87     RPE 11     Perceived Dyspnea  3     VO2 Peak 10.04     Symptoms No     Resting HR 77 bpm     Resting BP 116/76 mmHg     Max Ex. HR 120 bpm     Max Ex. BP 140/78 mmHg     Pre/Post BP   Baseline BP 116/76 mmHg     6 Minute BP 140/78 mmHg     2 Minute Post BP 142/82 mmHg     Pre/Post BP? Yes     Interval HR   Baseline HR 77     1 Minute HR 91     2 Minute HR 120     3 Minute HR 105     4 Minute HR 105     5 Minute HR 101     6 Minute HR 100     2 Minute Post HR 90     Interval Heart Rate? Yes     Interval Oxygen   Interval Oxygen? Yes     Baseline Oxygen Saturation % 96 %     Baseline Liters of Oxygen 3 L  3 liters O2     1 Minute Oxygen Saturation % 91 %     1 Minute Liters of Oxygen 3 L     2 Minute Oxygen Saturation % 86 %     2 Minute Liters of Oxygen 3 L     3 Minute Oxygen Saturation % 84 %     3 Minute Liters of Oxygen 3 L     4 Minute Oxygen Saturation % 84 %     4  Minute Liters of Oxygen 3 L     5 Minute Oxygen Saturation % 89 %     5 Minute Liters of Oxygen 3 L     6 Minute Oxygen Saturation % 86 %     6 Minute Liters of Oxygen 3 L     2 Minute Post Oxygen Saturation % 94 %     2 Minute Post Liters of Oxygen 3 L        Initial Exercise Prescription:     Initial Exercise Prescription - 01/11/16 1600    Date of Initial Exercise Prescription   Date 01/11/16   Oxygen   Oxygen Continuous   Liters 3   Bike   Level 1.2   Minutes 15   NuStep   Level 1   Minutes 15   METs 2.4   Track   Laps 8   Minutes 15   Prescription Details   Frequency (times per week) 2   Duration Progress to 45 minutes of aerobic exercise without signs/symptoms  of physical distress   Intensity   THRR 40-80% of Max Heartrate 63-126   Ratings of Perceived Exertion 11-13   Perceived Dyspnea 0-4   Resistance Training   Training Prescription Yes   Weight orange bands   Reps 10-12      Perform Capillary Blood Glucose checks as needed.  Exercise Prescription Changes:   Exercise Comments:   Discharge Exercise Prescription (Final Exercise Prescription Changes):    Nutrition:  Target Goals: Understanding of nutrition guidelines, daily intake of sodium <1526m, cholesterol <2077m calories 30% from fat and 7% or less from saturated fats, daily to have 5 or more servings of fruits and vegetables.  Biometrics:     Pre Biometrics - 01/07/16 1112    Pre Biometrics   Grip Strength 48 kg       Nutrition Therapy Plan and Nutrition Goals:   Nutrition Discharge: Rate Your Plate Scores:   Psychosocial: Target Goals: Acknowledge presence or absence of depression, maximize coping skills, provide positive support system. Participant is able to verbalize types and ability to use techniques and skills needed for reducing stress and depression.  Initial Review & Psychosocial Screening:     Initial Psych Review & Screening - 01/07/16 1117    Initial Review    Current issues with Current Stress Concerns;Current Sleep Concerns   Source of Stress Concerns Unable to participate in former interests or hobbies;Unable to perform yard/household activities  ex wife medical condition   [place]   Barriers   Psychosocial barriers to participate in program Psychosocial barriers identified (see note);The patient should benefit from training in stress management and relaxation.   Screening Interventions   Interventions Encouraged to exercise      Quality of Life Scores:     Quality of Life - 01/11/16 1411    Quality of Life Scores   Health/Function Pre 16.77 %   Socioeconomic Pre 19.31 %   Psych/Spiritual Pre 21.5 %   Family Pre 20.13 %   GLOBAL Pre 18.65 %      PHQ-9:     Recent Review Flowsheet Data    [facility] 01/07[facility]2016   Decreased Interest 0   Down, Depressed, Hopeless 0   PHQ - 2 Score 0      Psychosocial Evaluation and Intervention:   Psychosocial Re-Evaluation:  Education: Education Goals: Education classes will be provided on a weekly basis, covering required topics. Participant will state understanding/return demonstration of topics presented.  Learning Barriers/Preferences:     Learning Barriers/Preferences - 01/07/16 1111    Learning Barriers/Preferences   Learning Barriers None   Learning Preferences Individual Instruction;Written Material;Group Instruction      Education Topics: Risk Factor Reduction:  -Group instruction that is supported by a PowerPoint presentation. Instructor discusses the definition of a risk factor, different risk factors for pulmonary disease, and how the heart and lungs work together.     Nutrition for Pulmonary Patient:  -Group instruction provided by PowerPoint slides, verbal discussion, and written materials to support subject matter. The instructor gives an explanation and review of healthy diet recommendations, which includes a  discussion on weight management, recommendations for fruit and vegetable consumption, as well as protein, fluid, caffeine, fiber, sodium, sugar, and alcohol. Tips for eating when patients are short of breath are discussed.   Pursed Lip Breathing:  -Group instruction that is supported by demonstration and informational handouts. Instructor discusses the benefits of pursed lip and diaphragmatic breathing and detailed demonstration  on how to preform both.     Oxygen Safety:  -Group instruction provided by PowerPoint, verbal discussion, and written material to support subject matter. There is an overview of "What is Oxygen" and "Why do we need it".  Instructor also reviews how to create a safe environment for oxygen use, the importance of using oxygen as prescribed, and the risks of noncompliance. There is a brief discussion on traveling with oxygen and resources the patient may utilize.   Oxygen Equipment:  -Group instruction provided by [facility] Staff utilizing handouts, written materials, and equipment demonstrations.   Signs and Symptoms:  -Group instruction provided by written material and verbal discussion to support subject matter. Warning signs and symptoms of infection, stroke, and heart attack are reviewed and when to call the physician/911 reinforced. Tips for preventing the spread of infection discussed.   Advanced Directives:  -Group instruction provided by verbal instruction and written material to support subject matter. Instructor reviews Advanced Directive laws and proper instruction for filling out document.   Pulmonary Video:  -Group video education that reviews the importance of medication and oxygen compliance, exercise, good nutrition, pulmonary hygiene, and pursed lip and diaphragmatic breathing for the pulmonary patient.   Exercise for the Pulmonary Patient:  -Group instruction that is supported by a PowerPoint presentation. Instructor discusses benefits of  exercise, core components of exercise, frequency, duration, and intensity of an exercise routine, importance of utilizing pulse oximetry during exercise, safety while exercising, and options of places to exercise outside of rehab.     Pulmonary Medications:  -Verbally interactive group education provided by instructor with focus on inhaled medications and proper administration.   Anatomy and Physiology of the Respiratory System and Intimacy:  -Group instruction provided by PowerPoint, verbal discussion, and written material to support subject matter. Instructor reviews respiratory cycle and anatomical components of the respiratory system and their functions. Instructor also reviews differences in obstructive and restrictive respiratory diseases with examples of each. Intimacy, Sex, and Sexuality differences are reviewed with a discussion on how relationships can change when diagnosed with pulmonary disease. Common sexual concerns are reviewed.   Knowledge Questionnaire Score:     Knowledge Questionnaire Score - 01/11/16 1410    Knowledge Questionnaire Score   Pre Score 11/13      Personal Goals and Risk Factors at Admission:     Personal Goals and Risk Factors at Admission - 01/07/16 1114    Core Components/Risk Factors/Patient Goals on Admission    Weight Management Obesity;Yes   Intervention Weight Management: Develop a combined nutrition and exercise program designed to reach desired caloric intake, while maintaining appropriate intake of nutrient and fiber, sodium and fats, and appropriate energy expenditure required for the weight goal.;Weight Management: Provide education and appropriate resources to help participant work on and attain dietary goals.;Obesity: Provide education and appropriate resources to help participant work on and attain dietary goals.;Weight Management/Obesity: Establish reasonable short term and long term weight goals.   Admit Weight 254 lb 6.6 oz (115.4 kg)    Expected Outcomes Short Term: Continue to assess and modify interventions until short term weight is achieved.;Long Term: Adherence to nutrition and physical activity/exercise program aimed toward attainment of established weight goal.   Sedentary Yes   Intervention Provide advice, education, support and counseling about physical activity/exercise needs.;Develop an individualized exercise prescription for aerobic and resistive training based on initial evaluation findings, risk stratification, comorbidities and participant's personal goals.   Expected Outcomes Achievement of increased cardiorespiratory fitness and enhanced flexibility,  muscular endurance and strength shown through measurements of functional capaciy and personal statement of participant.   Improve shortness of breath with ADL's Yes   Intervention Provide education, individualized exercise plan and daily activity instruction to help decrease symptoms of SOB with activities of daily living.   Expected Outcomes Short Term: Achieves a reduction of symptoms when performing activities of daily living.   Develop more efficient breathing techniques such as purse lipped breathing and diaphragmatic breathing; and practicing self-pacing with activity Yes   Intervention Provide education, demonstration and support about specific breathing techniuqes utilized for more efficient breathing. Include techniques such as pursed lipped breathing, diaphragmatic breathing and self-pacing activity.   Expected Outcomes Short Term: Participant will be able to demonstrate and use breathing techniques as needed throughout daily activities.   Increase knowledge of respiratory medications and ability to use respiratory devices properly  Yes   Intervention Provide education and demonstration as needed of appropriate use of medications, inhalers, and oxygen therapy.   Expected Outcomes Short Term: Achieves understanding of medications use. Understands that oxygen is a  medication prescribed by physician. Demonstrates appropriate use of inhaler and oxygen therapy.   Diabetes Yes   Intervention Provide education about signs/symptoms and action to take for hypo/hyperglycemia.;Provide education about proper nutrition, including hydration, and aerobic/resistive exercise prescription along with prescribed medications to achieve blood glucose in normal ranges: Fasting glucose 65-99 mg/dL   Expected Outcomes Short Term: Participant verbalizes understanding of the signs/symptoms and immediate care of hyper/hypoglycemia, proper foot care and importance of medication, aerobic/resistive exercise and nutrition plan for blood glucose control.;Long Term: Attainment of HbA1C < 7%.   Hypertension Yes   Intervention Provide education on lifestyle modifcations including regular physical activity/exercise, weight management, moderate sodium restriction and increased consumption of fresh fruit, vegetables, and low fat dairy, alcohol moderation, and smoking cessation.;Monitor prescription use compliance.   Expected Outcomes Short Term: Continued assessment and intervention until BP is < 140/62m HG in hypertensive participants. < 130/842mHG in hypertensive participants with diabetes, heart failure or chronic kidney disease.;Long Term: Maintenance of blood pressure at goal levels.   Personal Goal Other Yes   Personal Goal possibly be able to golf and bowl again   Intervention build stamina and stregnth thru increasing workloads on exercise equiptment   Expected Outcomes patient will feel that he has enough stamina and stregnth to participate in the above activities even if activity has time modifications      Personal Goals and Risk Factors Review:    Personal Goals Discharge (Final Personal Goals and Risk Factors Review):    ITP Comments:   Comments: RoHazle Nordmann89.o. male   Pulmonary Rehab Orientation Note Patient arrived today in Cardiac and Pulmonary Rehab for  orientation to Pulmonary Rehab. [facility] He does carry portable oxygen. Per pt, he uses oxygen continuously, 3 liters during the day and 4 liters at night. Color good, skin warm and dry. Patient is oriented to time and place. Patient's medical history, psychosocial health, and medications reviewed. Psychosocial assessment reveals pt lives alon, but his ex-wife is currently residing with him because of her own illnesses.. Pt is currently unemployed, disabled. Pt hobbies include playing golf and bowling, however he has not been able to participate in either of these activities since knee surgery in 2012 and his declining health from IPF. Pt reports his stress level is moderate. Areas of stress/anxiety include Family. His ex-wife is on chronic pain medication  and according to him takes more than she needs to some days. She fell several days ago when getting out of the tub.  Pt does not exhibit signs of depression. He does admit that he would have insomnia if he did not take a sleeping pill. PHQ2/9 score 0/na. Pt shows good  coping skills with positive outlook. He clearly understands his disease and is comfortable talking about its progression and death. He has planned accordingly so that his ex-wife and adult daughter will be financially cared for after his death. He is offered emotional support and reassurance. Physical assessment reveals heart rate is normal, breath sounds clear to auscultation in upper lobes, fine crackles in lower lobes. Grip strength equal, strong. Distal pulses palpable. Patient reports he does take medications as prescribed. Patient states he follows a Diabetic diet. The patient reports no specific efforts to gain or lose weight.. Patient's weight will be monitored closely. Demonstration and practice of PLB using pulse oximeter. Patient able to return demonstration satisfactorily. Safety and hand hygiene in the exercise area reviewed with patient.  Patient voices understanding of the information reviewed. Department expectations discussed with patient and achievable goals were set. The patient shows enthusiasm about attending the program and we look forward to working with this nice gentleman. The patient is scheduled for a 6 min walk test on Tuesday 3/21 at 3:30 and to begin exercise on Thursday 3/23 in the 10:30 class.

## 2016-01-10 DIAGNOSIS — J9611 Chronic respiratory failure with hypoxia: Secondary | ICD-10-CM | POA: Diagnosis not present

## 2016-01-10 DIAGNOSIS — J849 Interstitial pulmonary disease, unspecified: Secondary | ICD-10-CM | POA: Diagnosis not present

## 2016-01-11 ENCOUNTER — Encounter (HOSPITAL_COMMUNITY)
Admission: RE | Admit: 2016-01-11 | Discharge: 2016-01-11 | Disposition: A | Discharge status: Home / Self Care | Payer: PPO | Source: Ambulatory Visit | Attending: Internal Medicine | Admitting: Internal Medicine

## 2016-01-11 DIAGNOSIS — J84112 Idiopathic pulmonary fibrosis: Secondary | ICD-10-CM | POA: Diagnosis not present

## 2016-01-13 ENCOUNTER — Encounter (HOSPITAL_COMMUNITY)
Admission: RE | Admit: 2016-01-13 | Discharge: 2016-01-13 | Disposition: A | Discharge status: Home / Self Care | Payer: PPO | Source: Ambulatory Visit | Attending: Internal Medicine | Admitting: Internal Medicine

## 2016-01-13 VITALS — Wt 257.5 lb

## 2016-01-13 DIAGNOSIS — J84112 Idiopathic pulmonary fibrosis: Secondary | ICD-10-CM | POA: Diagnosis not present

## 2016-01-13 NOTE — Progress Notes (Signed)
Daily Session Note  Patient Details  Name: Jerry Montgomery MRN: [id number] Date of Birth: November 16, 1955 Referring Provider:  [name], MD  Encounter Date: 01/13/2016  Check In:     Session Check In - 01/13/16 1100    Check-In   Location [facility]   Staff Present Rosebud Poles, RN, Luisa Hart, RN, BSN;Ramon Dredge, RN, MHA;Jessica Luan Pulling, MA, ACSM RCEP, Exercise Physiologist;Maria Venetia Maxon, RN, BSN   Supervising physician immediately available to respond to emergencies [facility] immediately available   Physician(s) Dr. Waldron Labs   Medication changes reported     No   Fall or balance concerns reported    No   Warm-up and Cool-down Performed as group-led instruction   Resistance Training Performed Yes   VAD Patient? No   Pain Assessment   Currently in Pain? No/denies   Multiple Pain Sites No      Capillary Blood Glucose: No results found for this or any previous visit (from the past 24 hour(s)).     POCT Glucose - 01/13/16 1238    POCT Blood Glucose   Pre-Exercise 294 mg/dL   Post-Exercise 208 mg/dL         Exercise Prescription Changes - 01/13/16 1200    Exercise Review   Progression No   Response to Exercise   Blood Pressure (Admit) 124/74 mmHg   Blood Pressure (Exercise) 158/62 mmHg   Blood Pressure (Exit) 104/60 mmHg   Heart Rate (Admit) 60 bpm   Heart Rate (Exercise) 90 bpm   Heart Rate (Exit) 73 bpm   Oxygen Saturation (Admit) 92 %   Oxygen Saturation (Exercise) 86 %   Oxygen Saturation (Exit) 96 %   Rating of Perceived Exertion (Exercise) 11   Perceived Dyspnea (Exercise) 1   Symptoms --  desaturated on exercise stations, o2 increased to 4 L   Comments --  increased to 4 L to prevent desaturations below 88   Duration Progress to 45 minutes of aerobic exercise without signs/symptoms of physical distress   Intensity Other (comment)   Progression   Progression --  40-80% HRR   Resistance Training   Training  Prescription Yes   Weight orange bands   Reps 10-12   Interval Training   Interval Training No   Oxygen   Oxygen Continuous   Liters 4   Bike   Level 2.5   Minutes 15   NuStep   Level 3   Minutes 15   METs 2.2     Goals Met:  Exercise tolerated well Queuing for purse lip breathing Strength training completed today  Goals Unmet:  Not Applicable  Comments: Service time is from 1030 to [place] attended the Meditation and Mindfullness class today by Jeanella Craze.  Dr. Rush Farmer is Medical Director for Pulmonary Rehab at [facility].

## 2016-01-17 ENCOUNTER — Ambulatory Visit: Payer: Medicare Other | Admitting: Internal Medicine

## 2016-01-18 ENCOUNTER — Encounter (HOSPITAL_COMMUNITY): Admission: RE | Admit: 2016-01-18 | Payer: PPO | Source: Ambulatory Visit

## 2016-01-19 DIAGNOSIS — Z7682 Awaiting organ transplant status: Secondary | ICD-10-CM | POA: Diagnosis not present

## 2016-01-19 DIAGNOSIS — R918 Other nonspecific abnormal finding of lung field: Secondary | ICD-10-CM | POA: Diagnosis not present

## 2016-01-19 DIAGNOSIS — J849 Interstitial pulmonary disease, unspecified: Secondary | ICD-10-CM | POA: Diagnosis not present

## 2016-01-19 DIAGNOSIS — M899 Disorder of bone, unspecified: Secondary | ICD-10-CM | POA: Diagnosis not present

## 2016-01-19 DIAGNOSIS — Z79899 Other long term (current) drug therapy: Secondary | ICD-10-CM | POA: Diagnosis not present

## 2016-01-19 DIAGNOSIS — Z87891 Personal history of nicotine dependence: Secondary | ICD-10-CM | POA: Diagnosis not present

## 2016-01-19 DIAGNOSIS — J8489 Other specified interstitial pulmonary diseases: Secondary | ICD-10-CM | POA: Diagnosis not present

## 2016-01-20 ENCOUNTER — Encounter (HOSPITAL_COMMUNITY)
Admission: RE | Admit: 2016-01-20 | Discharge: 2016-01-20 | Disposition: A | Discharge status: Home / Self Care | Payer: PPO | Source: Ambulatory Visit | Attending: Internal Medicine | Admitting: Internal Medicine

## 2016-01-20 VITALS — Wt 261.5 lb

## 2016-01-20 DIAGNOSIS — J84112 Idiopathic pulmonary fibrosis: Secondary | ICD-10-CM

## 2016-01-20 NOTE — Progress Notes (Signed)
Patient presented to his biweekly pulmonary rehab exercise session denying complaints. After check-in vitals taken, patient check blood sugar and stated it was 325. This CBG does not fall within our police for exercise of CBG>110 and <300. Patient stated he has taken diabetic medication as prescribed but he stopped by [employer] just prior to exercise for a breakfast burrito. Discussed with patient importance of following a diabetic diet prior to exercise. Patient verbalized understanding. Discharged home in stable condition.

## 2016-01-24 NOTE — Progress Notes (Signed)
Pulmonary Individual Treatment Plan  Patient Details  Name: Jerry Montgomery MRN: [id number] Date of Birth: 1956/07/08 Referring Provider:  [name], MD  Initial Encounter Date:       Pulmonary Rehab Walk Test from 01/11/2016 in [place]   Date  01/11/16      Visit Diagnosis: IPF (idiopathic pulmonary fibrosis) ([place])  Patient's Home Medications on Admission:   Current outpatient prescriptions:  .  ADVAIR HFA 115-21 MCG/ACT inhaler, Inhale 2 puffs into the lungs 2 (two) times daily., Disp: , Rfl:  .  albuterol (PROVENTIL HFA;VENTOLIN HFA) 108 (90 BASE) MCG/ACT inhaler, Inhale 2 puffs into the lungs every 6 (six) hours as needed for wheezing., Disp: 1 Inhaler, Rfl: 0 .  ALPRAZolam (XANAX) 0.5 MG tablet, Take 1 tablet (0.5 mg total) by mouth 3 (three) times daily as needed for anxiety., Disp: 90 tablet, Rfl: 0 .  aspirin 81 MG tablet, Take 81 mg by mouth daily., Disp: , Rfl:  .  atorvastatin (LIPITOR) 40 MG tablet, Take 1 tablet by mouth  every night at bedtime, Disp: 90 tablet, Rfl: 1 .  blood glucose meter kit and supplies KIT, Dispense based on patient and insurance preference. Use up to four times daily as directed. (FOR ICD E11.65), Disp: 1 each, Rfl: 0 .  Blood Glucose Monitoring Suppl (ONE TOUCH ULTRA SYSTEM KIT) w/Device KIT, Use to monitor FSBS 4x daily for fluctuating blood sugars. Dx: E11.65., Disp: 1 each, Rfl: 1 .  clopidogrel (PLAVIX) 75 MG tablet, Take 1 tablet by mouth  every day, Disp: 90 tablet, Rfl: 3 .  cyclobenzaprine (FLEXERIL) 10 MG tablet, Take 1 tablet (10 mg total) by mouth 3 (three) times daily as needed for muscle spasms., Disp: 30 tablet, Rfl: 0 .  diltiazem (CARDIZEM) 30 MG tablet, Take 1 tablet (30 mg total) by mouth every 6 (six) hours., Disp: 360 tablet, Rfl: 1 .  furosemide (LASIX) 20 MG tablet, Take 1 tablet (20 mg total) by mouth daily as needed for fluid (fluid)., Disp: 90 tablet, Rfl: 3 .  glucose blood test strip,  Use to monitor FSBS 4x daily for fluctuating blood sugars. Dx: E11.65., Disp: 400 each, Rfl: 4 .  insulin aspart (NOVOLOG) 100 UNIT/ML injection, 10 UNITS WITH BREAKFAST AND 10 UNITS WITH LUNCH, 30 UNITS WITH SUPPER, Disp: 60 mL, Rfl: 4 .  Insulin Glargine (LANTUS SOLOSTAR) 100 UNIT/ML Solostar Pen, Inject 60 Units into the skin at bedtime., Disp: 15 mL, Rfl: 11 .  insulin lispro (HUMALOG) 100 UNIT/ML KiwkPen, Inject 10units into the skin with breakfast and lunch, and inject 20units into the skin with dinner. (Patient not taking: Reported on 01/07/2016), Disp: 30 mL, Rfl: 11 .  Insulin Pen Needle (B-D ULTRAFINE III SHORT PEN) 31G X 8 MM MISC, Use as directed, Disp: 200 each, Rfl: 3 .  isosorbide mononitrate (IMDUR) 60 MG 24 hr tablet, Take 1 and 1/2 tablets by  mouth every day (Patient taking differently: Take 60 mg by  mouth every day), Disp: 135 tablet, Rfl: 1 .  levothyroxine (SYNTHROID, LEVOTHROID) 125 MCG tablet, Take 1 tablet (125 mcg total) by mouth daily., Disp: 90 tablet, Rfl: 3 .  linagliptin (TRADJENTA) 5 MG TABS tablet, Take 1 tablet (5 mg total) by mouth daily., Disp: 90 tablet, Rfl: 3 .  Menthol, Topical Analgesic, (BIOFREEZE EX), Apply 1 application topically as needed (for pain)., Disp: , Rfl:  .  metFORMIN (GLUCOPHAGE) 850 MG tablet, Take 1 tablet by mouth  twice  a day, Disp: 180 tablet, Rfl: 1 .  metoprolol (TOPROL XL) 200 MG 24 hr tablet, Take 1 tablet (200 mg total) by mouth daily., Disp: 90 tablet, Rfl: 3 .  nitroGLYCERIN (NITROSTAT) 0.4 MG SL tablet, Place 1 tablet (0.4 mg total) under the tongue every 5 (five) minutes as needed for chest pain., Disp: 25 tablet, Rfl: 1 .  SYRINGE/NEEDLE, DISP, 1 ML (B-D SYRINGE/NEEDLE 1CC/25GX5/8) 25G X 5/8" 1 ML MISC, As directed, Disp: 100 each, Rfl: 5 .  tiotropium (SPIRIVA) 18 MCG inhalation capsule, Place 18 mcg into inhaler and inhale daily. , Disp: , Rfl:  .  zolpidem (AMBIEN) 10 MG tablet, Take 1 tablet (10 mg total) by mouth at bedtime as  needed for sleep., Disp: 90 tablet, Rfl: 1  Past Medical History: Past Medical History  Diagnosis Date  . Hypertension   . Hyperlipidemia   . Postinflammatory pulmonary fibrosis ([place])   . CAD (coronary artery disease)   . Pneumothorax on right 4/11  . COPD (chronic obstructive pulmonary disease) ([place])   . Atrial tachycardia ([place])   . Acute diastolic heart failure ([place])   . On home oxygen therapy     "3L; 24/7" (04/15/2015)  . Sleep apnea     "won't use CPAP" (04/14/2105)  . Hypothyroidism   . Type II diabetes mellitus ([place])   . WPW (Wolff-Parkinson-White syndrome)     Per Dr. Tanna Furry Note  . SVT (supraventricular tachycardia) ([place])     Per Dr. Tanna Furry note  . Atrial fibrillation ([facility])     Tobacco Use: History  Smoking status  . Former Smoker -- 1.50 packs/day for 35 years  . Types: Cigarettes  . Quit date: 10/24/2007  Smokeless tobacco  . Never Used    Labs: Recent Review Flowsheet Data    Labs for ITP Cardiac and Pulmonary Rehab Latest Ref Rng 05/18/2015 07/06/2015 07/15/2015 07/20/2015 07/22/2015   Cholestrol 125 - 200 mg/dL - 131 - - -   LDLCALC <130 mg/dL - 62 - - -   HDL >=40 mg/dL - 33(L) - - -   Trlycerides <150 mg/dL - 181(H) - - -   Hemoglobin A1c <5.7 % - 8.6(H) - - -   PHART 7.350 - 7.450 - - 7.410 7.355 7.467(H)   PCO2ART 35.0 - 45.0 mmHg - - 38.0 46.0(H) 33.4(L)   HCO3 20.0 - 24.0 mEq/L 28.3(H) - 23.6 25.2(H) 23.9   TCO2 0 - 100 mmol/L 30 - 24.8 26.6 24.9   ACIDBASEDEF 0.0 - 2.0 mmol/L - - 0.4 - -   O2SAT - 69.0 - 97.2 93.1 95.6      Capillary Blood Glucose: Lab Results  Component Value Date   GLUCAP 182* 07/23/2015   GLUCAP 132* 07/23/2015   GLUCAP 138* 07/22/2015   GLUCAP 246* 07/22/2015   GLUCAP 185* 07/22/2015       POCT Glucose      01/13/16 1238 01/20/16 1256         POCT Blood Glucose   Pre-Exercise 294 mg/dL 325 mg/dL      Post-Exercise 208 mg/dL          ADL UCSD:     Pulmonary Assessment Scores      01/11/16 1412        ADL UCSD   SOB Score total 31        Pulmonary Function Assessment:     Pulmonary Function Assessment - 01/07/16 1111    Breath   Bilateral Breath Sounds Other   Other  fine crackles in bases   Shortness of Breath Yes;Limiting activity      Exercise Target Goals:    Exercise Program Goal: Individual exercise prescription set with THRR, safety & activity barriers. Participant demonstrates ability to understand and report RPE using BORG scale, to self-measure pulse accurately, and to acknowledge the importance of the exercise prescription.  Exercise Prescription Goal: Starting with aerobic activity 30 plus minutes a day, 3 days per week for initial exercise prescription. Provide home exercise prescription and guidelines that participant acknowledges understanding prior to discharge.  Activity Barriers & Risk Stratification:     Activity Barriers & Cardiac Risk Stratification - 01/07/16 1110    Activity Barriers & Cardiac Risk Stratification   Activity Barriers Right Knee Replacement;Balance Concerns;Deconditioning;Joint Problems;Shortness of Breath      6 Minute Walk:     6 Minute Walk      01/11/16 1634       6 Minute Walk   Phase Initial     Distance 1072 feet     Walk Time 4.48 minutes     # of Rest Breaks 1     MPH 2.72     METS 2.87     RPE 11     Perceived Dyspnea  3     VO2 Peak 10.04     Symptoms No     Resting HR 77 bpm     Resting BP 116/76 mmHg     Max Ex. HR 120 bpm     Max Ex. BP 140/78 mmHg     2 Minute Post BP 142/82 mmHg     Interval HR   Baseline HR 77     1 Minute HR 91     2 Minute HR 120     3 Minute HR 105     4 Minute HR 105     5 Minute HR 101     6 Minute HR 100     2 Minute Post HR 90     Interval Heart Rate? Yes     Interval Oxygen   Interval Oxygen? Yes     Baseline Oxygen Saturation % 96 %     Baseline Liters of Oxygen 3 L  3 liters O2     1 Minute Oxygen Saturation % 91 %     1 Minute Liters of Oxygen 3 L     2  Minute Oxygen Saturation % 86 %     2 Minute Liters of Oxygen 3 L     3 Minute Oxygen Saturation % 84 %     3 Minute Liters of Oxygen 3 L     4 Minute Oxygen Saturation % 84 %     4 Minute Liters of Oxygen 3 L     5 Minute Oxygen Saturation % 89 %     5 Minute Liters of Oxygen 3 L     6 Minute Oxygen Saturation % 86 %     6 Minute Liters of Oxygen 3 L     2 Minute Post Oxygen Saturation % 94 %     2 Minute Post Liters of Oxygen 3 L     Pre/Post BP   Baseline BP 116/76 mmHg     6 Minute BP 140/78 mmHg     Pre/Post BP? Yes        Initial Exercise Prescription:     Initial Exercise Prescription - 01/11/16 1600    Date of Initial Exercise Prescription  Date 01/11/16   Oxygen   Oxygen Continuous   Liters 3   Bike   Level 1.2   Minutes 15   NuStep   Level 1   Minutes 15   METs 2.4   Track   Laps 8   Minutes 15   Prescription Details   Frequency (times per week) 2   Duration Progress to 45 minutes of aerobic exercise without signs/symptoms of physical distress   Intensity   THRR 40-80% of Max Heartrate 63-126   Ratings of Perceived Exertion 11-13   Perceived Dyspnea 0-4   Resistance Training   Training Prescription Yes   Weight orange bands   Reps 10-12      Perform Capillary Blood Glucose checks as needed.  Exercise Prescription Changes:     Exercise Prescription Changes      01/13/16 1200 01/20/16 1200         Exercise Review   Progression No       Response to Exercise   Blood Pressure (Admit) 124/74 mmHg 110/64 mmHg      Blood Pressure (Exercise) 158/62 mmHg       Blood Pressure (Exit) 104/60 mmHg       Heart Rate (Admit) 60 bpm 76 bpm      Heart Rate (Exercise) 90 bpm       Heart Rate (Exit) 73 bpm       Oxygen Saturation (Admit) 92 % 89 %      Oxygen Saturation (Exercise) 86 %       Oxygen Saturation (Exit) 96 %       Rating of Perceived Exertion (Exercise) 11       Perceived Dyspnea (Exercise) 1       Symptoms --  desaturated on exercise  stations, o2 increased to 4 L       Comments --  increased to 4 L to prevent desaturations below 88       Duration Progress to 45 minutes of aerobic exercise without signs/symptoms of physical distress       Intensity Other (comment)       Progression   Progression --  40-80% HRR       Resistance Training   Training Prescription Yes       Weight orange bands       Reps 10-12       Interval Training   Interval Training No       Oxygen   Oxygen Continuous Continuous      Liters 4 3      Bike   Level 2.5       Minutes 15       NuStep   Level 3       Minutes 15       METs 2.2          Exercise Comments:     Exercise Comments      01/17/16 1420           Exercise Comments Starting off well with exercise tolerance, will continue to follow exericse progression          Discharge Exercise Prescription (Final Exercise Prescription Changes):     Exercise Prescription Changes - 01/20/16 1200    Response to Exercise   Blood Pressure (Admit) 110/64 mmHg   Heart Rate (Admit) 76 bpm   Oxygen Saturation (Admit) 89 %   Oxygen   Oxygen Continuous   Liters 3       Nutrition:  Target  Goals: Understanding of nutrition guidelines, daily intake of sodium <1559m, cholesterol <2037m calories 30% from fat and 7% or less from saturated fats, daily to have 5 or more servings of fruits and vegetables.  Biometrics:     Pre Biometrics - 01/07/16 1112    Pre Biometrics   Grip Strength 48 kg       Nutrition Therapy Plan and Nutrition Goals:   Nutrition Discharge: Rate Your Plate Scores:   Psychosocial: Target Goals: Acknowledge presence or absence of depression, maximize coping skills, provide positive support system. Participant is able to verbalize types and ability to use techniques and skills needed for reducing stress and depression.  Initial Review & Psychosocial Screening:     Initial Psych Review & Screening - 01/07/16 1117    Initial Review   Current issues  with Current Stress Concerns;Current Sleep Concerns   Source of Stress Concerns Unable to participate in former interests or hobbies;Unable to perform yard/household activities  ex wife medical condition   FaCanaanYes   Barriers   Psychosocial barriers to participate in program Psychosocial barriers identified (see note);The patient should benefit from training in stress management and relaxation.   Screening Interventions   Interventions Encouraged to exercise      Quality of Life Scores:     Quality of Life - 01/11/16 1411    Quality of Life Scores   Health/Function Pre 16.77 %   Socioeconomic Pre 19.31 %   Psych/Spiritual Pre 21.5 %   Family Pre 20.13 %   GLOBAL Pre 18.65 %      PHQ-9:     Recent Review Flowsheet Data    [facility] 01/07[facility]   Decreased Interest 0   Down, Depressed, Hopeless 0   PHQ - 2 Score 0      Psychosocial Evaluation and Intervention:   Psychosocial Re-Evaluation:     Psychosocial Re-Evaluation      01/24/16 0719 01/24/16 0720         Psychosocial Re-Evaluation   Interventions  Encouraged to attend Pulmonary Rehabilitation for the exercise      Comments patient has only attended 1 exercise session. Will continue to evaluate progress towards psycosocial goals       Continued Psychosocial Services Needed Yes         Education: Education Goals: Education classes will be provided on a weekly basis, covering required topics. Participant will state understanding/return demonstration of topics presented.  Learning Barriers/Preferences:     Learning Barriers/Preferences - 01/07/16 1111    Learning Barriers/Preferences   Learning Barriers None   Learning Preferences Individual Instruction;Written Material;Group Instruction      Education Topics: Risk Factor Reduction:  -Group instruction that is supported by a PowerPoint presentation. Instructor discusses the definition of a risk factor,  different risk factors for pulmonary disease, and how the heart and lungs work together.     Nutrition for Pulmonary Patient:  -Group instruction provided by PowerPoint slides, verbal discussion, and written materials to support subject matter. The instructor gives an explanation and review of healthy diet recommendations, which includes a discussion on weight management, recommendations for fruit and vegetable consumption, as well as protein, fluid, caffeine, fiber, sodium, sugar, and alcohol. Tips for eating when patients are short of breath are discussed.   Pursed Lip Breathing:  -Group instruction that is supported by demonstration and informational handouts. Instructor discusses the benefits of pursed lip and diaphragmatic breathing and detailed demonstration on how to  preform both.     Oxygen Safety:  -Group instruction provided by PowerPoint, verbal discussion, and written material to support subject matter. There is an overview of "What is Oxygen" and "Why do we need it".  Instructor also reviews how to create a safe environment for oxygen use, the importance of using oxygen as prescribed, and the risks of noncompliance. There is a brief discussion on traveling with oxygen and resources the patient may utilize.   Oxygen Equipment:  -Group instruction provided by [facility] Staff utilizing handouts, written materials, and equipment demonstrations.   Signs and Symptoms:  -Group instruction provided by written material and verbal discussion to support subject matter. Warning signs and symptoms of infection, stroke, and heart attack are reviewed and when to call the physician/911 reinforced. Tips for preventing the spread of infection discussed.   Advanced Directives:  -Group instruction provided by verbal instruction and written material to support subject matter. Instructor reviews Advanced Directive laws and proper instruction for filling out document.   Pulmonary Video:  -Group  video education that reviews the importance of medication and oxygen compliance, exercise, good nutrition, pulmonary hygiene, and pursed lip and diaphragmatic breathing for the pulmonary patient.   Exercise for the Pulmonary Patient:  -Group instruction that is supported by a PowerPoint presentation. Instructor discusses benefits of exercise, core components of exercise, frequency, duration, and intensity of an exercise routine, importance of utilizing pulse oximetry during exercise, safety while exercising, and options of places to exercise outside of rehab.     Pulmonary Medications:  -Verbally interactive group education provided by instructor with focus on inhaled medications and proper administration.          PULMONARY REHAB OTHER RESPIRATORY from 01/20/2016 in [place]   Date  01/20/16   Educator  Pharm D   Instruction Review Code  2- meets goals/outcomes      Anatomy and Physiology of the Respiratory System and Intimacy:  -Group instruction provided by PowerPoint, verbal discussion, and written material to support subject matter. Instructor reviews respiratory cycle and anatomical components of the respiratory system and their functions. Instructor also reviews differences in obstructive and restrictive respiratory diseases with examples of each. Intimacy, Sex, and Sexuality differences are reviewed with a discussion on how relationships can change when diagnosed with pulmonary disease. Common sexual concerns are reviewed.   Knowledge Questionnaire Score:     Knowledge Questionnaire Score - 01/11/16 1410    Knowledge Questionnaire Score   Pre Score 11/13      Core Components/Risk Factors/Patient Goals at Admission:     Personal Goals and Risk Factors at Admission - 01/07/16 1114    Core Components/Risk Factors/Patient Goals on Admission    Weight Management Obesity;Yes   Intervention Weight Management: Develop a combined nutrition and  exercise program designed to reach desired caloric intake, while maintaining appropriate intake of nutrient and fiber, sodium and fats, and appropriate energy expenditure required for the weight goal.;Weight Management: Provide education and appropriate resources to help participant work on and attain dietary goals.;Obesity: Provide education and appropriate resources to help participant work on and attain dietary goals.;Weight Management/Obesity: Establish reasonable short term and long term weight goals.   Admit Weight 254 lb 6.6 oz (115.4 kg)   Expected Outcomes Short Term: Continue to assess and modify interventions until short term weight is achieved.;Long Term: Adherence to nutrition and physical activity/exercise program aimed toward attainment of established weight goal.   Sedentary Yes   Intervention Provide advice,  education, support and counseling about physical activity/exercise needs.;Develop an individualized exercise prescription for aerobic and resistive training based on initial evaluation findings, risk stratification, comorbidities and participant's personal goals.   Expected Outcomes Achievement of increased cardiorespiratory fitness and enhanced flexibility, muscular endurance and strength shown through measurements of functional capaciy and personal statement of participant.   Improve shortness of breath with ADL's Yes   Intervention Provide education, individualized exercise plan and daily activity instruction to help decrease symptoms of SOB with activities of daily living.   Expected Outcomes Short Term: Achieves a reduction of symptoms when performing activities of daily living.   Develop more efficient breathing techniques such as purse lipped breathing and diaphragmatic breathing; and practicing self-pacing with activity Yes   Intervention Provide education, demonstration and support about specific breathing techniuqes utilized for more efficient breathing. Include techniques  such as pursed lipped breathing, diaphragmatic breathing and self-pacing activity.   Expected Outcomes Short Term: Participant will be able to demonstrate and use breathing techniques as needed throughout daily activities.   Increase knowledge of respiratory medications and ability to use respiratory devices properly  Yes   Intervention Provide education and demonstration as needed of appropriate use of medications, inhalers, and oxygen therapy.   Expected Outcomes Short Term: Achieves understanding of medications use. Understands that oxygen is a medication prescribed by physician. Demonstrates appropriate use of inhaler and oxygen therapy.   Diabetes Yes   Intervention Provide education about signs/symptoms and action to take for hypo/hyperglycemia.;Provide education about proper nutrition, including hydration, and aerobic/resistive exercise prescription along with prescribed medications to achieve blood glucose in normal ranges: Fasting glucose 65-99 mg/dL   Expected Outcomes Short Term: Participant verbalizes understanding of the signs/symptoms and immediate care of hyper/hypoglycemia, proper foot care and importance of medication, aerobic/resistive exercise and nutrition plan for blood glucose control.;Long Term: Attainment of HbA1C < 7%.   Hypertension Yes   Intervention Provide education on lifestyle modifcations including regular physical activity/exercise, weight management, moderate sodium restriction and increased consumption of fresh fruit, vegetables, and low fat dairy, alcohol moderation, and smoking cessation.;Monitor prescription use compliance.   Expected Outcomes Short Term: Continued assessment and intervention until BP is < 140/51m HG in hypertensive participants. < 130/855mHG in hypertensive participants with diabetes, heart failure or chronic kidney disease.;Long Term: Maintenance of blood pressure at goal levels.   Personal Goal Other Yes   Personal Goal possibly be able to golf  and bowl again   Intervention build stamina and stregnth thru increasing workloads on exercise equiptment   Expected Outcomes patient will feel that he has enough stamina and stregnth to participate in the above activities even if activity has time modifications      Core Components/Risk Factors/Patient Goals Review:      Goals and Risk Factor Review      01/24/16 0720           Core Components/Risk Factors/Patient Goals Review   Review Patient has only attended 1 exercise session. Will continue to evaluate progression of personal goals.       Expected Outcomes see expected outcomes on intial review          Core Components/Risk Factors/Patient Goals at Discharge (Final Review):      Goals and Risk Factor Review - 01/24/16 0720    Core Components/Risk Factors/Patient Goals Review   Review Patient has only attended 1 exercise session. Will continue to evaluate progression of personal goals.   Expected Outcomes see expected outcomes on intial review  ITP Comments:   Comments: Patient has attended 1 exercise and education session since initial admission ITP was established. Will continue to monitor patients progress with personal and psychosocial goals.

## 2016-01-25 ENCOUNTER — Encounter (HOSPITAL_COMMUNITY)
Admission: RE | Admit: 2016-01-25 | Discharge: 2016-01-25 | Disposition: A | Discharge status: Home / Self Care | Payer: PPO | Source: Ambulatory Visit | Attending: Internal Medicine | Admitting: Internal Medicine

## 2016-01-25 DIAGNOSIS — J84112 Idiopathic pulmonary fibrosis: Secondary | ICD-10-CM | POA: Insufficient documentation

## 2016-01-25 NOTE — Progress Notes (Signed)
Daily Session Note  Patient Details  Name: Jerry Montgomery MRN: [id number] Date of Birth: 13-Mar-1956 Referring Provider:  [name], MD  Encounter Date: 01/25/2016  Check In:     Session Check In - 01/25/16 1019    Check-In   Location [facility]   Staff Present Rodney Langton, Felipe Drone, RN, MHA;Portia Rollene Rotunda, RN, Levie Heritage, MA, ACSM RCEP, Exercise Physiologist   Supervising physician immediately available to respond to emergencies [facility] immediately available   Physician(s) Dr. Marily Memos   Medication changes reported     No   Fall or balance concerns reported    No   Resistance Training Performed Yes   VAD Patient? No   Pain Assessment   Currently in Pain? No/denies      Capillary Blood Glucose: No results found for this or any previous visit (from the past 24 hour(s)).     POCT Glucose - 01/25/16 1229    POCT Blood Glucose   Pre-Exercise 248 mg/dL   Post-Exercise 192 mg/dL         Exercise Prescription Changes - 01/25/16 1200    Exercise Review   Progression Yes   Response to Exercise   Blood Pressure (Admit) 132/80 mmHg   Blood Pressure (Exercise) 172/90 mmHg   Blood Pressure (Exit) 92/88 mmHg   Heart Rate (Admit) 81 bpm   Heart Rate (Exercise) 110 bpm   Heart Rate (Exit) 92 bpm   Oxygen Saturation (Admit) 95 %   Oxygen Saturation (Exercise) 85 %   Oxygen Saturation (Exit) 96 %   Rating of Perceived Exertion (Exercise) 13   Perceived Dyspnea (Exercise) 1   Symptoms none   Comments increased O2 to 5L on bike and 6L on track   Duration Progress to 45 minutes of aerobic exercise without signs/symptoms of physical distress   Intensity THRR unchanged   Progression   Progression Continue to progress workloads to maintain intensity without signs/symptoms of physical distress.   Resistance Training   Training Prescription Yes   Weight orange bands   Reps 10-12   Interval Training   Interval Training No   Oxygen   Oxygen Continuous   Liters 6   Bike   Level 2.5   Watts 42   Minutes 15   NuStep   Level 3   Minutes 15   METs 2.5   Track   Laps 12   Minutes 15     Goals Met:  Exercise tolerated well Queuing for purse lip breathing No report of cardiac concerns or symptoms Strength training completed today  Goals Unmet:  O2 Sat: Increased from 3L to 5L on bike (86%) and 6L on track (85%)  Comments: Service time is from 1030 to 1210    Dr. Rush Farmer is Medical Director for Pulmonary Rehab at [facility].

## 2016-01-27 ENCOUNTER — Encounter (HOSPITAL_COMMUNITY)
Admission: RE | Admit: 2016-01-27 | Discharge: 2016-01-27 | Disposition: A | Discharge status: Home / Self Care | Payer: PPO | Source: Ambulatory Visit | Attending: Internal Medicine | Admitting: Internal Medicine

## 2016-01-27 VITALS — Wt 252.0 lb

## 2016-01-27 DIAGNOSIS — J84112 Idiopathic pulmonary fibrosis: Secondary | ICD-10-CM | POA: Diagnosis not present

## 2016-01-27 NOTE — Progress Notes (Signed)
Daily Session Note  Patient Details  Name: Jerry Montgomery MRN: [id number] Date of Birth: 1955/11/15 Referring Provider:  Brand Males, MD  Encounter Date: 01/27/2016  Check In:     Session Check In - 01/27/16 1208    Check-In   [facility]   Staff Present Rosebud Poles, RN, BSN;Molly diVincenzo, MS, ACSM RCEP, Exercise Physiologist;Portia Rollene Rotunda, RN, Roque Cash, RN   Supervising physician immediately available to respond to emergencies [facility] immediately available   Physician(s) Dr, Loleta Books   Medication changes reported     No   Fall or balance concerns reported    No   Warm-up and Cool-down Performed as group-led instruction   Resistance Training Performed Yes   VAD Patient? No   Pain Assessment   Currently in Pain? No/denies   Multiple Pain Sites No      Capillary Blood Glucose: No results found for this or any previous visit (from the past 24 hour(s)).     POCT Glucose - 01/27/16 1238    POCT Blood Glucose   Pre-Exercise 182 mg/dL   Post-Exercise 185 mg/dL         Exercise Prescription Changes - 01/27/16 1200    Exercise Review   Progression No   Response to Exercise   Blood Pressure (Admit) 110/60 mmHg   Blood Pressure (Exercise) 120/60 mmHg   Blood Pressure (Exit) 108/68 mmHg   Heart Rate (Admit) 65 bpm   Heart Rate (Exercise) 75 bpm   Heart Rate (Exit) 61 bpm   Oxygen Saturation (Admit) 96 %   Oxygen Saturation (Exercise) 85 %  Desaturated to 85% on 6L walking on track, rest, PLB improve   Oxygen Saturation (Exit) 96 %   Rating of Perceived Exertion (Exercise) 13   Perceived Dyspnea (Exercise) 1   Symptoms --  Desaturated on walking track on 6 L, rest and PLB encouraged   Duration Progress to 45 minutes of aerobic exercise without signs/symptoms of physical distress   Intensity THRR unchanged   Progression   Progression Continue to progress workloads to maintain intensity without signs/symptoms of physical  distress.   Resistance Training   Training Prescription Yes   Weight orange bands   Reps 10-12   Interval Training   Interval Training No   Oxygen   Oxygen Continuous   Liters 6   Bike   Level 2.5   Minutes 15   Track   Laps 13   Minutes 15     Goals Met:  Queuing for purse lip breathing Strength training completed today  Goals Unmet:  O2 Sat  Comments: Service time is from 1030 to 1230  Referral by Dr. Chase Caller  Dr. Rush Farmer is Medical Director for Pulmonary Rehab at [facility].

## 2016-02-01 ENCOUNTER — Encounter (HOSPITAL_COMMUNITY)
Admission: RE | Admit: 2016-02-01 | Discharge: 2016-02-01 | Disposition: A | Discharge status: Home / Self Care | Payer: PPO | Source: Ambulatory Visit | Attending: Internal Medicine | Admitting: Internal Medicine

## 2016-02-01 ENCOUNTER — Telehealth: Payer: Self-pay | Admitting: *Deleted

## 2016-02-01 VITALS — Wt 254.2 lb

## 2016-02-01 DIAGNOSIS — J84112 Idiopathic pulmonary fibrosis: Secondary | ICD-10-CM

## 2016-02-01 NOTE — Progress Notes (Signed)
Daily Session Note  Patient Details  Name: Jerry Montgomery MRN: [id number] Date of Birth: 1956-08-04 Referring Provider:  [name], MD  Encounter Date: 02/01/2016  Check In:     Session Check In - 02/01/16 1107    Check-In   [facility]   Staff Present Rodney Langton, RN;Portia Rollene Rotunda, RN, Deland Pretty, MS, ACSM CEP, Exercise Physiologist;Tyheim Vanalstyne, MS, ACSM RCEP, Exercise Physiologist   Supervising physician immediately available to respond to emergencies [facility] immediately available   Physician(s) Dr. Marily Memos   Medication changes reported     No   Fall or balance concerns reported    No   Warm-up and Cool-down Performed as group-led instruction   Resistance Training Performed Yes   VAD Patient? No   Pain Assessment   Currently in Pain? No/denies   Multiple Pain Sites No      Capillary Blood Glucose: No results found for this or any previous visit (from the past 24 hour(s)).      Exercise Prescription Changes - 02/01/16 1200    Exercise Review   Progression Yes   Response to Exercise   Blood Pressure (Admit) 118/70 mmHg   Blood Pressure (Exercise) 130/60 mmHg   Blood Pressure (Exit) 112/66 mmHg   Heart Rate (Admit) 59 bpm   Heart Rate (Exercise) 82 bpm   Heart Rate (Exit) 58 bpm   Oxygen Saturation (Admit) 98 %   Oxygen Saturation (Exercise) 88 %  Desaturated to 85% on 6L walking on track, rest, PLB improve   Oxygen Saturation (Exit) 96 %   Rating of Perceived Exertion (Exercise) 13   Perceived Dyspnea (Exercise) 1   Symptoms --  Desaturated on walking track on 6 L, rest and PLB encouraged   Duration Progress to 45 minutes of aerobic exercise without signs/symptoms of physical distress   Intensity THRR unchanged   Progression   Progression Continue to progress workloads to maintain intensity without signs/symptoms of physical distress.   Resistance Training   Training Prescription Yes   Weight blue bands    Reps 10-12   Interval Training   Interval Training No   Oxygen   Oxygen Continuous   Liters 6   Bike   Level 3.5   Minutes 15   NuStep   Level 5   Minutes 15   METs 2.9   Track   Laps 12   Minutes 15     Goals Met:  Queuing for purse lip breathing No report of cardiac concerns or symptoms Strength training completed today  Goals Unmet:  Not Applicable  Comments: Service time is from 10:30am to 12:05pm    Dr. Rush Farmer is Medical Director for Pulmonary Rehab at [facility].

## 2016-02-01 NOTE — Telephone Encounter (Addendum)
Received request from pharmacy for PA on Ambien.   Patient is <193 years old, therefore [employer] criteria does not apply.   PA submitted.   Dx: Insomnia.

## 2016-02-02 ENCOUNTER — Other Ambulatory Visit: Payer: Self-pay | Admitting: Family Medicine

## 2016-02-02 MED ORDER — METFORMIN HCL 850 MG PO TABS: ORAL_TABLET | ORAL | Status: DC

## 2016-02-02 NOTE — Telephone Encounter (Addendum)
Received PA determination.   PA approved 02/02/2016 -10/22/2016.  PA Case: [id number].  Pharmacy made aware.

## 2016-02-03 ENCOUNTER — Encounter (HOSPITAL_COMMUNITY)
Admission: RE | Admit: 2016-02-03 | Discharge: 2016-02-03 | Disposition: A | Discharge status: Home / Self Care | Payer: PPO | Source: Ambulatory Visit | Attending: Internal Medicine | Admitting: Internal Medicine

## 2016-02-03 VITALS — Wt 255.3 lb

## 2016-02-03 DIAGNOSIS — J84112 Idiopathic pulmonary fibrosis: Secondary | ICD-10-CM | POA: Diagnosis not present

## 2016-02-03 NOTE — Progress Notes (Signed)
Daily Session Note  Patient Details  Name: Jerry Montgomery MRN: [id number] Date of Birth: 1956-06-14 Referring Provider:    Encounter Date: 02/03/2016  Check In:     Session Check In - 02/03/16 1347    Check-In   [facility]   Staff Present Su Hilt, MS, ACSM RCEP, Exercise Physiologist;Portia Rollene Rotunda, Therapist, sports, BSN;Ramon Dredge, RN, MHA;Maria Whitaker, RN, BSN   Supervising physician immediately available to respond to emergencies [facility] immediately available   Physician(s) Dr. Aggie Moats   Medication changes reported     No   Fall or balance concerns reported    No   Warm-up and Cool-down Performed as group-led instruction   Resistance Training Performed Yes   VAD Patient? No   Pain Assessment   Currently in Pain? No/denies   Multiple Pain Sites No      Capillary Blood Glucose: No results found for this or any previous visit (from the past 24 hour(s)).     POCT Glucose - 02/03/16 1349    POCT Blood Glucose   Pre-Exercise 208 mg/dL   Post-Exercise 188 mg/dL         Exercise Prescription Changes - 02/03/16 1300    Exercise Review   Progression No   Response to Exercise   Blood Pressure (Admit) 110/70 mmHg   Blood Pressure (Exercise) 142/70 mmHg   Blood Pressure (Exit) 110/64 mmHg   Heart Rate (Admit) 59 bpm   Heart Rate (Exercise) 87 bpm   Heart Rate (Exit) 63 bpm   Oxygen Saturation (Admit) 96 %   Oxygen Saturation (Exercise) 89 %   Oxygen Saturation (Exit) 92 %   Rating of Perceived Exertion (Exercise) 13   Perceived Dyspnea (Exercise) 1   Duration Progress to 45 minutes of aerobic exercise without signs/symptoms of physical distress   Intensity THRR unchanged   Progression   Progression Continue to progress workloads to maintain intensity without signs/symptoms of physical distress.   Resistance Training   Training Prescription Yes   Weight blue bands   Reps 10-12   Interval Training   Interval Training No   Oxygen   Oxygen Continuous   Liters 6   NuStep   Level 5   Minutes 15   METs 2.9   Track   Laps 10   Minutes 15     Goals Met:  Exercise tolerated well No report of cardiac concerns or symptoms Strength training completed today  Goals Unmet:  O2 Sat Pt has been desaturating on track to less than 88%. Increased his O2 to 8L on the track.  Comments: Service time is from 1030 to 1215    Dr. Rush Farmer is Medical Director for Pulmonary Rehab at [facility].

## 2016-02-08 ENCOUNTER — Encounter (HOSPITAL_COMMUNITY)
Admission: RE | Admit: 2016-02-08 | Discharge: 2016-02-08 | Disposition: A | Discharge status: Home / Self Care | Payer: PPO | Source: Ambulatory Visit | Attending: Internal Medicine | Admitting: Internal Medicine

## 2016-02-08 ENCOUNTER — Telehealth: Payer: Self-pay | Admitting: Internal Medicine

## 2016-02-08 VITALS — Wt 255.3 lb

## 2016-02-08 DIAGNOSIS — J449 Chronic obstructive pulmonary disease, unspecified: Secondary | ICD-10-CM

## 2016-02-08 DIAGNOSIS — J84112 Idiopathic pulmonary fibrosis: Secondary | ICD-10-CM

## 2016-02-08 NOTE — Telephone Encounter (Signed)
Called and spoke with Portia at pulmonary rehab. She states that [facility] and he qualified for 5L of oxygen when walking. She states that he was turned down for the transplant due to his weight. They told him that he will need to lose 30lbs before he can qualify again for a transplant. She states that since starting Pulmonary Rehab he has had to increase his oxygen to 8L while exercising and needs something other then a POC to sustain his oxygen level above 87%. She states that she discussed this with [facility] but they refused to help with this order stating they would prefer this to be addressed by MR. I explained to her that I would send the message to MR for his recs. She voiced understanding and had no further questions. Daniel Nonesortia states that if MR has any questions that he can email her for further detail.  MR please advise

## 2016-02-08 NOTE — Progress Notes (Signed)
Daily Session Note  Patient Details  Name: Jerry Montgomery MRN: [id number] Date of Birth: 06/09/1956 Referring Provider:    Encounter Date: 02/08/2016  Check In:     Session Check In - 02/08/16 1220    Check-In   [facility]   Staff Present Rosebud Poles, RN, BSN;Molly diVincenzo, MS, ACSM RCEP, Exercise Physiologist;Annedrea [name], RN, MHA;Portia Rollene Rotunda, RN, BSN   Supervising physician immediately available to respond to emergencies [facility] immediately available   Physician(s) Dr. Marily Memos   Medication changes reported     No   Fall or balance concerns reported    No   Resistance Training Performed Yes   VAD Patient? No   Pain Assessment   Currently in Pain? No/denies   Multiple Pain Sites No      Capillary Blood Glucose: No results found for this or any previous visit (from the past 24 hour(s)).     POCT Glucose - 02/08/16 1227    POCT Blood Glucose   Pre-Exercise 235 mg/dL   Post-Exercise 212 mg/dL         Exercise Prescription Changes - 02/08/16 1200    Response to Exercise   Blood Pressure (Admit) 122/74 mmHg   Blood Pressure (Exercise) 150/76 mmHg   Blood Pressure (Exit) 110/60 mmHg   Heart Rate (Admit) 59 bpm   Heart Rate (Exercise) 84 bpm   Heart Rate (Exit) 57 bpm   Oxygen Saturation (Admit) 97 %   Oxygen Saturation (Exercise) 89 %   Oxygen Saturation (Exit) 99 %   Rating of Perceived Exertion (Exercise) 11   Perceived Dyspnea (Exercise) 2   Duration Progress to 45 minutes of aerobic exercise without signs/symptoms of physical distress   Intensity THRR unchanged   Progression   Progression Continue to progress workloads to maintain intensity without signs/symptoms of physical distress.   Resistance Training   Training Prescription Yes   Weight blue bands   Reps 10-12   Interval Training   Interval Training No   Oxygen   Oxygen Continuous   Liters 6   Bike   Level 3.5   Minutes 15   NuStep   Level 5   Minutes 15   METs 2.6   Track   Laps 8   Minutes 15     Goals Met:  Exercise tolerated well Personal goals reviewed No report of cardiac concerns or symptoms Strength training completed today  Goals Unmet:  Not Applicable  Comments: Service time is from 1030 to 1210    Dr. Rush Farmer is Medical Director for Pulmonary Rehab at [facility].

## 2016-02-10 ENCOUNTER — Encounter (HOSPITAL_COMMUNITY)
Admission: RE | Admit: 2016-02-10 | Discharge: 2016-02-10 | Disposition: A | Discharge status: Home / Self Care | Payer: PPO | Source: Ambulatory Visit | Attending: Internal Medicine | Admitting: Internal Medicine

## 2016-02-10 VITALS — Wt 251.5 lb

## 2016-02-10 DIAGNOSIS — J9611 Chronic respiratory failure with hypoxia: Secondary | ICD-10-CM | POA: Diagnosis not present

## 2016-02-10 DIAGNOSIS — J84112 Idiopathic pulmonary fibrosis: Secondary | ICD-10-CM

## 2016-02-10 DIAGNOSIS — J849 Interstitial pulmonary disease, unspecified: Secondary | ICD-10-CM | POA: Diagnosis not present

## 2016-02-10 NOTE — Telephone Encounter (Signed)
pls have Portial call me on cell tomorrow 02/11/16 AM. Pls send msg back tome

## 2016-02-10 NOTE — Progress Notes (Signed)
Daily Session Note  Patient Details  Name: Jerry Montgomery MRN: [id number] Date of Birth: September 24, 1956 Referring Provider:    Encounter Date: 02/10/2016  Check In:     Session Check In - 02/10/16 1101    Check-In   Location [facility]   Staff Present Rosebud Poles, RN, BSN;Molly diVincenzo, MS, ACSM RCEP, Exercise Physiologist;Lisa Ysidro Evert, RN;Briseyda Fehr Rollene Rotunda, RN, BSN   Supervising physician immediately available to respond to emergencies [facility] immediately available   Physician(s) Dr. Marily Memos   Medication changes reported     No   Fall or balance concerns reported    No   Warm-up and Cool-down Performed as group-led instruction   Resistance Training Performed Yes   VAD Patient? No   Pain Assessment   Currently in Pain? No/denies   Multiple Pain Sites No      Capillary Blood Glucose: No results found for this or any previous visit (from the past 24 hour(s)).     POCT Glucose - 02/10/16 1348    POCT Blood Glucose   Pre-Exercise 157 mg/dL   Post-Exercise 262 mg/dL         Exercise Prescription Changes - 02/10/16 1230    Response to Exercise   Blood Pressure (Admit) 98/60 mmHg   Blood Pressure (Exercise) 112/70 mmHg   Blood Pressure (Exit) 108/50 mmHg   Heart Rate (Admit) 70 bpm   Heart Rate (Exercise) 79 bpm   Heart Rate (Exit) 69 bpm   Oxygen Saturation (Admit) 98 %   Oxygen Saturation (Exercise) 83 %  pt forgot to turn O2 up to 8 liters with exercise   Oxygen Saturation (Exit) 97 %   Rating of Perceived Exertion (Exercise) 11   Perceived Dyspnea (Exercise) 1   Duration Progress to 45 minutes of aerobic exercise without signs/symptoms of physical distress   Intensity THRR unchanged   Progression   Progression Continue to progress workloads to maintain intensity without signs/symptoms of physical distress.   Resistance Training   Training Prescription Yes   Weight blue bands   Reps 10-12   Interval Training   Interval Training No   Oxygen   Oxygen Continuous   Liters 6   Bike   Level 3.5   Minutes 15   NuStep   Level 4   Minutes 15   METs 2.3     Goals Met:  Improved SOB with ADL's Using PLB without cueing & demonstrates good technique Achieving weight loss Changing diet to healthy choices, watching portion sizes Exercise tolerated well No report of cardiac concerns or symptoms Strength training completed today  Goals Unmet:  Not Applicable  Comments: Service time is from 1030 to 1230   Dr. Rush Farmer is Medical Director for Pulmonary Rehab at [facility]

## 2016-02-10 NOTE — Telephone Encounter (Signed)
Will an Oximizer work? If asymptomatic at rehab they can accept pulse ox 80%

## 2016-02-10 NOTE — Progress Notes (Signed)
Pulmonary Individual Treatment Plan  Patient Details  Name: Jerry Montgomery MRN: [id number] Date of Birth: [birth year] Referring Provider:    Initial Encounter Date:       Pulmonary Rehab Walk Test from 01/11/2016 in [place]   Date  01/11/16      Visit Diagnosis: No diagnosis found.  Patient's Home Medications on Admission:   Current outpatient prescriptions:  .  ADVAIR HFA 115-21 MCG/ACT inhaler, Inhale 2 puffs into the lungs 2 (two) times daily., Disp: , Rfl:  .  albuterol (PROVENTIL HFA;VENTOLIN HFA) 108 (90 BASE) MCG/ACT inhaler, Inhale 2 puffs into the lungs every 6 (six) hours as needed for wheezing., Disp: 1 Inhaler, Rfl: 0 .  ALPRAZolam (XANAX) 0.5 MG tablet, Take 1 tablet (0.5 mg total) by mouth 3 (three) times daily as needed for anxiety., Disp: 90 tablet, Rfl: 0 .  aspirin 81 MG tablet, Take 81 mg by mouth daily., Disp: , Rfl:  .  atorvastatin (LIPITOR) 40 MG tablet, Take 1 tablet by mouth  every night at bedtime, Disp: 90 tablet, Rfl: 1 .  blood glucose meter kit and supplies KIT, Dispense based on patient and insurance preference. Use up to four times daily as directed. (FOR ICD E11.65), Disp: 1 each, Rfl: 0 .  Blood Glucose Monitoring Suppl (ONE TOUCH ULTRA SYSTEM KIT) w/Device KIT, Use to monitor FSBS 4x daily for fluctuating blood sugars. Dx: E11.65., Disp: 1 each, Rfl: 1 .  clopidogrel (PLAVIX) 75 MG tablet, Take 1 tablet by mouth  every day, Disp: 90 tablet, Rfl: 3 .  cyclobenzaprine (FLEXERIL) 10 MG tablet, Take 1 tablet (10 mg total) by mouth 3 (three) times daily as needed for muscle spasms., Disp: 30 tablet, Rfl: 0 .  diltiazem (CARDIZEM) 30 MG tablet, Take 1 tablet (30 mg total) by mouth every 6 (six) hours., Disp: 360 tablet, Rfl: 1 .  furosemide (LASIX) 20 MG tablet, Take 1 tablet (20 mg total) by mouth daily as needed for fluid (fluid)., Disp: 90 tablet, Rfl: 3 .  glucose blood test strip, Use to monitor FSBS 4x daily for  fluctuating blood sugars. Dx: E11.65., Disp: 400 each, Rfl: 4 .  insulin aspart (NOVOLOG) 100 UNIT/ML injection, 10 UNITS WITH BREAKFAST AND 10 UNITS WITH LUNCH, 30 UNITS WITH SUPPER, Disp: 60 mL, Rfl: 4 .  Insulin Glargine (LANTUS SOLOSTAR) 100 UNIT/ML Solostar Pen, Inject 60 Units into the skin at bedtime., Disp: 15 mL, Rfl: 11 .  insulin lispro (HUMALOG) 100 UNIT/ML KiwkPen, Inject 10units into the skin with breakfast and lunch, and inject 20units into the skin with dinner. (Patient not taking: Reported on 01/07/2016), Disp: 30 mL, Rfl: 11 .  Insulin Pen Needle (B-D ULTRAFINE III SHORT PEN) 31G X 8 MM MISC, Use as directed, Disp: 200 each, Rfl: 3 .  isosorbide mononitrate (IMDUR) 60 MG 24 hr tablet, Take 1 and 1/2 tablets by  mouth every day (Patient taking differently: Take 60 mg by  mouth every day), Disp: 135 tablet, Rfl: 1 .  levothyroxine (SYNTHROID, LEVOTHROID) 125 MCG tablet, Take 1 tablet (125 mcg total) by mouth daily., Disp: 90 tablet, Rfl: 3 .  linagliptin (TRADJENTA) 5 MG TABS tablet, Take 1 tablet (5 mg total) by mouth daily., Disp: 90 tablet, Rfl: 3 .  Menthol, Topical Analgesic, (BIOFREEZE EX), Apply 1 application topically as needed (for pain)., Disp: , Rfl:  .  metFORMIN (GLUCOPHAGE) 850 MG tablet, Take 1 tablet by mouth  twice a day, Disp: 180 tablet,  Rfl: 3 .  metoprolol (TOPROL XL) 200 MG 24 hr tablet, Take 1 tablet (200 mg total) by mouth daily., Disp: 90 tablet, Rfl: 3 .  nitroGLYCERIN (NITROSTAT) 0.4 MG SL tablet, Place 1 tablet (0.4 mg total) under the tongue every 5 (five) minutes as needed for chest pain., Disp: 25 tablet, Rfl: 1 .  SYRINGE/NEEDLE, DISP, 1 ML (B-D SYRINGE/NEEDLE 1CC/25GX5/8) 25G X 5/8" 1 ML MISC, As directed, Disp: 100 each, Rfl: 5 .  tiotropium (SPIRIVA) 18 MCG inhalation capsule, Place 18 mcg into inhaler and inhale daily. , Disp: , Rfl:  .  zolpidem (AMBIEN) 10 MG tablet, Take 1 tablet (10 mg total) by mouth at bedtime as needed for sleep., Disp: 90  tablet, Rfl: 1  Past Medical History: Past Medical History  Diagnosis Date  . Hypertension   . Hyperlipidemia   . Postinflammatory pulmonary fibrosis ([place])   . CAD (coronary artery disease)   . Pneumothorax on right 4/11  . COPD (chronic obstructive pulmonary disease) ([place])   . Atrial tachycardia ([place])   . Acute diastolic heart failure ([place])   . On home oxygen therapy     "3L; 24/7" (04/15/2015)  . Sleep apnea     "won't use CPAP" (04/14/2105)  . Hypothyroidism   . Type II diabetes mellitus ([place])   . WPW (Wolff-Parkinson-White syndrome)     Per Dr. Tanna Furry Note  . SVT (supraventricular tachycardia) ([place])     Per Dr. Tanna Furry note  . Atrial fibrillation ([facility])     Tobacco Use: History  Smoking status  . Former Smoker -- 1.50 packs/day for 35 years  . Types: Cigarettes  . Quit date: 10/24/2007  Smokeless tobacco  . Never Used    Labs: Recent Review Flowsheet Data    Labs for ITP Cardiac and Pulmonary Rehab Latest Ref Rng 05/18/2015 07/06/2015 07/15/2015 07/20/2015 07/22/2015   Cholestrol 125 - 200 mg/dL - 131 - - -   LDLCALC <130 mg/dL - 62 - - -   HDL >=40 mg/dL - 33(L) - - -   Trlycerides <150 mg/dL - 181(H) - - -   Hemoglobin A1c <5.7 % - 8.6(H) - - -   PHART 7.350 - 7.450 - - 7.410 7.355 7.467(H)   PCO2ART 35.0 - 45.0 mmHg - - 38.0 46.0(H) 33.4(L)   HCO3 20.0 - 24.0 mEq/L 28.3(H) - 23.6 25.2(H) 23.9   TCO2 0 - 100 mmol/L 30 - 24.8 26.6 24.9   ACIDBASEDEF 0.0 - 2.0 mmol/L - - 0.4 - -   O2SAT - 69.0 - 97.2 93.1 95.6      Capillary Blood Glucose: Lab Results  Component Value Date   GLUCAP 182* 07/23/2015   GLUCAP 132* 07/23/2015   GLUCAP 138* 07/22/2015   GLUCAP 246* 07/22/2015   GLUCAP 185* 07/22/2015       POCT Glucose      01/13/16 1238 01/20/16 1256 01/25/16 1229 01/27/16 1238 02/01/16 1221   POCT Blood Glucose   Pre-Exercise 294 mg/dL 325 mg/dL 248 mg/dL 182 mg/dL 179 mg/dL   Post-Exercise 208 mg/dL  192 mg/dL 185 mg/dL 161 mg/dL     02/03/16 1349  02/08/16 1227         POCT Blood Glucose   Pre-Exercise 208 mg/dL 235 mg/dL      Post-Exercise 188 mg/dL 212 mg/dL         ADL UCSD:     Pulmonary Assessment Scores      01/11/16 1412       ADL  UCSD   SOB Score total 31        Pulmonary Function Assessment:     Pulmonary Function Assessment - 01/07/16 1111    Breath   Bilateral Breath Sounds Other   Other fine crackles in bases   Shortness of Breath Yes;Limiting activity      Exercise Target Goals:    Exercise Program Goal: Individual exercise prescription set with THRR, safety & activity barriers. Participant demonstrates ability to understand and report RPE using BORG scale, to self-measure pulse accurately, and to acknowledge the importance of the exercise prescription.  Exercise Prescription Goal: Starting with aerobic activity 30 plus minutes a day, 3 days per week for initial exercise prescription. Provide home exercise prescription and guidelines that participant acknowledges understanding prior to discharge.  Activity Barriers & Risk Stratification:     Activity Barriers & Cardiac Risk Stratification - 01/07/16 1110    Activity Barriers & Cardiac Risk Stratification   Activity Barriers Right Knee Replacement;Balance Concerns;Deconditioning;Joint Problems;Shortness of Breath      6 Minute Walk:     6 Minute Walk      01/11/16 1634       6 Minute Walk   Phase Initial     Distance 1072 feet     Walk Time 4.48 minutes     # of Rest Breaks 1     MPH 2.72     METS 2.87     RPE 11     Perceived Dyspnea  3     VO2 Peak 10.04     Symptoms No     Resting HR 77 bpm     Resting BP 116/76 mmHg     Max Ex. HR 120 bpm     Max Ex. BP 140/78 mmHg     2 Minute Post BP 142/82 mmHg     Interval HR   Baseline HR 77     1 Minute HR 91     2 Minute HR 120     3 Minute HR 105     4 Minute HR 105     5 Minute HR 101     6 Minute HR 100     2 Minute Post HR 90     Interval Heart Rate? Yes     Interval  Oxygen   Interval Oxygen? Yes     Baseline Oxygen Saturation % 96 %     Baseline Liters of Oxygen 3 L  3 liters O2     1 Minute Oxygen Saturation % 91 %     1 Minute Liters of Oxygen 3 L     2 Minute Oxygen Saturation % 86 %     2 Minute Liters of Oxygen 3 L     3 Minute Oxygen Saturation % 84 %     3 Minute Liters of Oxygen 3 L     4 Minute Oxygen Saturation % 84 %     4 Minute Liters of Oxygen 3 L     5 Minute Oxygen Saturation % 89 %     5 Minute Liters of Oxygen 3 L     6 Minute Oxygen Saturation % 86 %     6 Minute Liters of Oxygen 3 L     2 Minute Post Oxygen Saturation % 94 %     2 Minute Post Liters of Oxygen 3 L     Pre/Post BP   Baseline BP 116/76 mmHg     6 Minute  BP 140/78 mmHg     Pre/Post BP? Yes        Initial Exercise Prescription:     Initial Exercise Prescription - 01/11/16 1600    Date of Initial Exercise RX and Referring Provider   Date 01/11/16   Oxygen   Oxygen Continuous   Liters 3   Bike   Level 1.2   Minutes 15   NuStep   Level 1   Minutes 15   METs 2.4   Track   Laps 8   Minutes 15   Prescription Details   Frequency (times per week) 2   Duration Progress to 45 minutes of aerobic exercise without signs/symptoms of physical distress   Intensity   THRR 40-80% of Max Heartrate 63-126   Ratings of Perceived Exertion 11-13   Perceived Dyspnea 0-4   Resistance Training   Training Prescription Yes   Weight orange bands   Reps 10-12      Perform Capillary Blood Glucose checks as needed.  Exercise Prescription Changes:     Exercise Prescription Changes      01/13/16 1200 01/20/16 1200 01/25/16 1200 01/27/16 1200 02/01/16 1200   Exercise Review   Progression No  Yes No Yes   Response to Exercise   Blood Pressure (Admit) 124/74 mmHg 110/64 mmHg 132/80 mmHg 110/60 mmHg 118/70 mmHg   Blood Pressure (Exercise) 158/62 mmHg  172/90 mmHg 120/60 mmHg 130/60 mmHg   Blood Pressure (Exit) 104/60 mmHg  92/88 mmHg 108/68 mmHg 112/66 mmHg    Heart Rate (Admit) 60 bpm 76 bpm 81 bpm 65 bpm 59 bpm   Heart Rate (Exercise) 90 bpm  110 bpm 75 bpm 82 bpm   Heart Rate (Exit) 73 bpm  92 bpm 61 bpm 58 bpm   Oxygen Saturation (Admit) 92 % 89 % 95 % 96 % 98 %   Oxygen Saturation (Exercise) 86 %  85 % 85 %  Desaturated to 85% on 6L walking on track, rest, PLB improve 88 %  Desaturated to 85% on 6L walking on track, rest, PLB improve   Oxygen Saturation (Exit) 96 %  96 % 96 % 96 %   Rating of Perceived Exertion (Exercise) 11  13 13 13    Perceived Dyspnea (Exercise) 1  1 1 1    Symptoms --  desaturated on exercise stations, o2 increased to 4 L  none --  Desaturated on walking track on 6 L, rest and PLB encouraged --  Desaturated on walking track on 6 L, rest and PLB encouraged   Comments --  increased to 4 L to prevent desaturations below 88  increased O2 to 5L on bike and 6L on track     Duration Progress to 45 minutes of aerobic exercise without signs/symptoms of physical distress  Progress to 45 minutes of aerobic exercise without signs/symptoms of physical distress Progress to 45 minutes of aerobic exercise without signs/symptoms of physical distress Progress to 45 minutes of aerobic exercise without signs/symptoms of physical distress   Intensity Other (comment)  THRR unchanged THRR unchanged THRR unchanged   Progression   Progression --  40-80% HRR  Continue to progress workloads to maintain intensity without signs/symptoms of physical distress. Continue to progress workloads to maintain intensity without signs/symptoms of physical distress. Continue to progress workloads to maintain intensity without signs/symptoms of physical distress.   Resistance Training   Training Prescription Yes  Yes Yes Yes   Weight orange bands  orange bands orange bands blue bands   Reps 10-12  10-12 10-12 10-12   Interval Training   Interval Training No  No No No   Oxygen   Oxygen Continuous Continuous Continuous Continuous Continuous   Liters 4 3 6 6 6     Bike   Level 2.5  2.5 2.5 3.5   Watts   42     Minutes 15  15 15 15    NuStep   Level 3  3  5    Minutes 15  15  15    METs 2.2  2.5  2.9   Track   Laps   12 13 12    Minutes   15 15 15      02/03/16 1300 02/08/16 1200         Exercise Review   Progression No       Response to Exercise   Blood Pressure (Admit) 110/70 mmHg 122/74 mmHg      Blood Pressure (Exercise) 142/70 mmHg 150/76 mmHg      Blood Pressure (Exit) 110/64 mmHg 110/60 mmHg      Heart Rate (Admit) 59 bpm 59 bpm      Heart Rate (Exercise) 87 bpm 84 bpm      Heart Rate (Exit) 63 bpm 57 bpm      Oxygen Saturation (Admit) 96 % 97 %      Oxygen Saturation (Exercise) 89 % 89 %      Oxygen Saturation (Exit) 92 % 99 %      Rating of Perceived Exertion (Exercise) 13 11      Perceived Dyspnea (Exercise) 1 2      Duration Progress to 45 minutes of aerobic exercise without signs/symptoms of physical distress Progress to 45 minutes of aerobic exercise without signs/symptoms of physical distress      Intensity THRR unchanged THRR unchanged      Progression   Progression Continue to progress workloads to maintain intensity without signs/symptoms of physical distress. Continue to progress workloads to maintain intensity without signs/symptoms of physical distress.      Resistance Training   Training Prescription Yes Yes      Weight blue bands blue bands      Reps 10-12 10-12      Interval Training   Interval Training No No      Oxygen   Oxygen Continuous Continuous      Liters 6 6      Bike   Level  3.5      Minutes  15      NuStep   Level 5 5      Minutes 15 15      METs 2.9 2.6      Track   Laps 10 8      Minutes 15 15         Exercise Comments:     Exercise Comments      01/17/16 1420 02/08/16 1651 02/10/16 0839       Exercise Comments Starting off well with exercise tolerance, will continue to follow exericse progression Starting off well with exercise tolerance, will continue to follow exericse progression  Starting off well with exercise tolerance, will continue to follow exercise progression.         Discharge Exercise Prescription (Final Exercise Prescription Changes):     Exercise Prescription Changes - 02/08/16 1200    Response to Exercise   Blood Pressure (Admit) 122/74 mmHg   Blood Pressure (Exercise) 150/76 mmHg   Blood Pressure (Exit) 110/60 mmHg   Heart Rate (Admit) 59 bpm  Heart Rate (Exercise) 84 bpm   Heart Rate (Exit) 57 bpm   Oxygen Saturation (Admit) 97 %   Oxygen Saturation (Exercise) 89 %   Oxygen Saturation (Exit) 99 %   Rating of Perceived Exertion (Exercise) 11   Perceived Dyspnea (Exercise) 2   Duration Progress to 45 minutes of aerobic exercise without signs/symptoms of physical distress   Intensity THRR unchanged   Progression   Progression Continue to progress workloads to maintain intensity without signs/symptoms of physical distress.   Resistance Training   Training Prescription Yes   Weight blue bands   Reps 10-12   Interval Training   Interval Training No   Oxygen   Oxygen Continuous   Liters 6   Bike   Level 3.5   Minutes 15   NuStep   Level 5   Minutes 15   METs 2.6   Track   Laps 8   Minutes 15       Nutrition:  Target Goals: Understanding of nutrition guidelines, daily intake of sodium <1543m, cholesterol <2032m calories 30% from fat and 7% or less from saturated fats, daily to have 5 or more servings of fruits and vegetables.  Biometrics:     Pre Biometrics - 01/07/16 1112    Pre Biometrics   Grip Strength 48 kg       Nutrition Therapy Plan and Nutrition Goals:     Nutrition Therapy & Goals - 01/31/16 0944    Nutrition Therapy   Diet Diabetic, Low Sodium   Personal Nutrition Goals   Personal Goal #1 Wt loss of 6-24 lb at graduation from Pulmonary Rehab   Personal Goal #2 Improve glycemic control as evidenced by an A1c < 7.0   Intervention Plan   Intervention Prescribe, educate and counsel regarding individualized  specific dietary modifications aiming towards targeted core components such as weight, hypertension, lipid management, diabetes, heart failure and other comorbidities.   Expected Outcomes Short Term Goal: Understand basic principles of dietary content, such as calories, fat, sodium, cholesterol and nutrients.;Long Term Goal: Adherence to prescribed nutrition plan.      Nutrition Discharge: Rate Your Plate Scores:     Nutrition Assessments - 01/31/16 0943    Rate Your Plate Scores   Pre Score 45      Psychosocial: Target Goals: Acknowledge presence or absence of depression, maximize coping skills, provide positive support system. Participant is able to verbalize types and ability to use techniques and skills needed for reducing stress and depression.  Initial Review & Psychosocial Screening:     Initial Psych Review & Screening - 01/07/16 1117    Initial Review   Current issues with Current Stress Concerns;Current Sleep Concerns   Source of Stress Concerns Unable to participate in former interests or hobbies;Unable to perform yard/household activities  ex wife medical condition   [place]   Barriers   Psychosocial barriers to participate in program Psychosocial barriers identified (see note);The patient should benefit from training in stress management and relaxation.   Screening Interventions   Interventions Encouraged to exercise      Quality of Life Scores:     Quality of Life - 01/11/16 1411    Quality of Life Scores   Health/Function Pre 16.77 %   Socioeconomic Pre 19.31 %   Psych/Spiritual Pre 21.5 %   Family Pre 20.13 %   GLOBAL Pre 18.65 %      PHQ-9:     Recent Review Flowsheet Data  Depression screen PHQ 2/9 01/07/2016   Decreased Interest 0   Down, Depressed, Hopeless 0   PHQ - 2 Score 0      Psychosocial Evaluation and Intervention:   Psychosocial Re-Evaluation:     Psychosocial Re-Evaluation      01/24/16  0719 01/24/16 0720 02/10/16 0808       Psychosocial Re-Evaluation   Interventions  Encouraged to attend Pulmonary Rehabilitation for the exercise Encouraged to attend Pulmonary Rehabilitation for the exercise     Comments patient has only attended 1 exercise session. Will continue to evaluate progress towards psycosocial goals       Continued Psychosocial Services Needed Yes         Education: Education Goals: Education classes will be provided on a weekly basis, covering required topics. Participant will state understanding/return demonstration of topics presented.  Learning Barriers/Preferences:     Learning Barriers/Preferences - 01/07/16 1111    Learning Barriers/Preferences   Learning Barriers None   Learning Preferences Individual Instruction;Written Material;Group Instruction      Education Topics: Risk Factor Reduction:  -Group instruction that is supported by a PowerPoint presentation. Instructor discusses the definition of a risk factor, different risk factors for pulmonary disease, and how the heart and lungs work together.     Nutrition for Pulmonary Patient:  -Group instruction provided by PowerPoint slides, verbal discussion, and written materials to support subject matter. The instructor gives an explanation and review of healthy diet recommendations, which includes a discussion on weight management, recommendations for fruit and vegetable consumption, as well as protein, fluid, caffeine, fiber, sodium, sugar, and alcohol. Tips for eating when patients are short of breath are discussed.   Pursed Lip Breathing:  -Group instruction that is supported by demonstration and informational handouts. Instructor discusses the benefits of pursed lip and diaphragmatic breathing and detailed demonstration on how to preform both.            PULMONARY REHAB OTHER RESPIRATORY from 02/03/2016 in [employer]   Date  02/03/16   Educator  RT    Instruction Review Code  2- meets goals/outcomes      Oxygen Safety:  -Group instruction provided by PowerPoint, verbal discussion, and written material to support subject matter. There is an overview of "What is Oxygen" and "Why do we need it".  Instructor also reviews how to create a safe environment for oxygen use, the importance of using oxygen as prescribed, and the risks of noncompliance. There is a brief discussion on traveling with oxygen and resources the patient may utilize.   Oxygen Equipment:  -Group instruction provided by [facility] Staff utilizing handouts, written materials, and equipment demonstrations.      PULMONARY REHAB OTHER RESPIRATORY from 02/03/2016 in [place]   Date  01/27/16   Educator  RN   Instruction Review Code  2- meets goals/outcomes      Signs and Symptoms:  -Group instruction provided by written material and verbal discussion to support subject matter. Warning signs and symptoms of infection, stroke, and heart attack are reviewed and when to call the physician/911 reinforced. Tips for preventing the spread of infection discussed.   Advanced Directives:  -Group instruction provided by verbal instruction and written material to support subject matter. Instructor reviews Advanced Directive laws and proper instruction for filling out document.   Pulmonary Video:  -Group video education that reviews the importance of medication and oxygen compliance, exercise, good nutrition, pulmonary hygiene, and pursed lip  and diaphragmatic breathing for the pulmonary patient.   Exercise for the Pulmonary Patient:  -Group instruction that is supported by a PowerPoint presentation. Instructor discusses benefits of exercise, core components of exercise, frequency, duration, and intensity of an exercise routine, importance of utilizing pulse oximetry during exercise, safety while exercising, and options of places to exercise outside of  rehab.     Pulmonary Medications:  -Verbally interactive group education provided by instructor with focus on inhaled medications and proper administration.      PULMONARY REHAB OTHER RESPIRATORY from 02/03/2016 in [place]   Date  01/20/16   Educator  Pharm D   Instruction Review Code  2- meets goals/outcomes      Anatomy and Physiology of the Respiratory System and Intimacy:  -Group instruction provided by PowerPoint, verbal discussion, and written material to support subject matter. Instructor reviews respiratory cycle and anatomical components of the respiratory system and their functions. Instructor also reviews differences in obstructive and restrictive respiratory diseases with examples of each. Intimacy, Sex, and Sexuality differences are reviewed with a discussion on how relationships can change when diagnosed with pulmonary disease. Common sexual concerns are reviewed.   Knowledge Questionnaire Score:     Knowledge Questionnaire Score - 01/11/16 1410    Knowledge Questionnaire Score   Pre Score 11/13      Core Components/Risk Factors/Patient Goals at Admission:     Personal Goals and Risk Factors at Admission - 01/07/16 1114    Core Components/Risk Factors/Patient Goals on Admission    Weight Management Obesity;Yes   Intervention Weight Management: Develop a combined nutrition and exercise program designed to reach desired caloric intake, while maintaining appropriate intake of nutrient and fiber, sodium and fats, and appropriate energy expenditure required for the weight goal.;Weight Management: Provide education and appropriate resources to help participant work on and attain dietary goals.;Obesity: Provide education and appropriate resources to help participant work on and attain dietary goals.;Weight Management/Obesity: Establish reasonable short term and long term weight goals.   Admit Weight 254 lb 6.6 oz (115.4 kg)   Expected Outcomes  Short Term: Continue to assess and modify interventions until short term weight is achieved.;Long Term: Adherence to nutrition and physical activity/exercise program aimed toward attainment of established weight goal.   Sedentary Yes   Intervention Provide advice, education, support and counseling about physical activity/exercise needs.;Develop an individualized exercise prescription for aerobic and resistive training based on initial evaluation findings, risk stratification, comorbidities and participant's personal goals.   Expected Outcomes Achievement of increased cardiorespiratory fitness and enhanced flexibility, muscular endurance and strength shown through measurements of functional capaciy and personal statement of participant.   Improve shortness of breath with ADL's Yes   Intervention Provide education, individualized exercise plan and daily activity instruction to help decrease symptoms of SOB with activities of daily living.   Expected Outcomes Short Term: Achieves a reduction of symptoms when performing activities of daily living.   Develop more efficient breathing techniques such as purse lipped breathing and diaphragmatic breathing; and practicing self-pacing with activity Yes   Intervention Provide education, demonstration and support about specific breathing techniuqes utilized for more efficient breathing. Include techniques such as pursed lipped breathing, diaphragmatic breathing and self-pacing activity.   Expected Outcomes Short Term: Participant will be able to demonstrate and use breathing techniques as needed throughout daily activities.   Increase knowledge of respiratory medications and ability to use respiratory devices properly  Yes   Intervention Provide education and demonstration as needed  of appropriate use of medications, inhalers, and oxygen therapy.   Expected Outcomes Short Term: Achieves understanding of medications use. Understands that oxygen is a medication  prescribed by physician. Demonstrates appropriate use of inhaler and oxygen therapy.   Diabetes Yes   Intervention Provide education about signs/symptoms and action to take for hypo/hyperglycemia.;Provide education about proper nutrition, including hydration, and aerobic/resistive exercise prescription along with prescribed medications to achieve blood glucose in normal ranges: Fasting glucose 65-99 mg/dL   Expected Outcomes Short Term: Participant verbalizes understanding of the signs/symptoms and immediate care of hyper/hypoglycemia, proper foot care and importance of medication, aerobic/resistive exercise and nutrition plan for blood glucose control.;Long Term: Attainment of HbA1C < 7%.   Hypertension Yes   Intervention Provide education on lifestyle modifcations including regular physical activity/exercise, weight management, moderate sodium restriction and increased consumption of fresh fruit, vegetables, and low fat dairy, alcohol moderation, and smoking cessation.;Monitor prescription use compliance.   Expected Outcomes Short Term: Continued assessment and intervention until BP is < 140/37m HG in hypertensive participants. < 130/867mHG in hypertensive participants with diabetes, heart failure or chronic kidney disease.;Long Term: Maintenance of blood pressure at goal levels.   Personal Goal Other Yes   Personal Goal possibly be able to golf and bowl again   Intervention build stamina and stregnth thru increasing workloads on exercise equiptment   Expected Outcomes patient will feel that he has enough stamina and stregnth to participate in the above activities even if activity has time modifications      Core Components/Risk Factors/Patient Goals Review:      Goals and Risk Factor Review      01/24/16 0720 02/10/16 0807         Core Components/Risk Factors/Patient Goals Review   Personal Goals Review  Weight Management/Obesity;Increase Strength and Stamina;Develop more efficient  breathing techniques such as purse lipped breathing and diaphragmatic breathing and practicing self-pacing with activity.;Improve shortness of breath with ADL's;Sedentary      Review Patient has only attended 1 exercise session. Will continue to evaluate progression of personal goals. see "comments" section on ITP      Expected Outcomes see expected outcomes on intial review see Admission expected outcomes         Core Components/Risk Factors/Patient Goals at Discharge (Final Review):      Goals and Risk Factor Review - 02/10/16 0807    Core Components/Risk Factors/Patient Goals Review   Personal Goals Review Weight Management/Obesity;Increase Strength and Stamina;Develop more efficient breathing techniques such as purse lipped breathing and diaphragmatic breathing and practicing self-pacing with activity.;Improve shortness of breath with ADL's;Sedentary   Review see "comments" section on ITP   Expected Outcomes see Admission expected outcomes      ITP Comments:   Comments: ITP REVIEW Pt is making expected progress toward personal goals after completing 7 sessions. He is in the process of receiving adequate O2 source for exercise at home. He currently has a POC which only goes to 5 liters pulse. He requires 8 liters continuous flow during exercise to keep sats at 88%. Dr. RaChase Calleras been notified. Patient being worked up at [facility], but has to loose 30+ pounds in order to be considered. Patient has been instructed to exercise daily to facilitate weight loss. Recommend continued exercise, life style modification, education, and utilization of breathing techniques to increase stamina and strength and decrease shortness of breath with exertion.

## 2016-02-10 NOTE — Progress Notes (Signed)
Jerry Montgomery 60 y.o. male Nutrition Note Spoke with pt. Pt is obese. Pt eats 2 meals and several snacks daily. Most meals prepared at home. There are some ways the pt can make his eating habits healthier, which were discussed when pt's Rate Your Plate results were reviewed. Pt has already made changes to his diet, such as eating smaller portion sizes, to help promote his wt loss effort. Pt wt is down 1.3 kg (2.9 lb) since admission. Pt's first wt loss goal is 30 lb. Pt avoids many salty foods; does not use canned/ convenience food.  Pt does not add salt to food. The role of sodium in lung disease reviewed with pt. Pt is diabetic. Pt's last A1c indicates blood glucose poorly controlled. Pt checks CBG's 3 times a day. Fasting CBG's reportedly  82-120 mg/dL. Pt reports CBG's prior to bedtime "in the 220-300 range." Pt states he stopped taking his Novolog and only takes his Lantus. Pt concerned about CBG's being high enough for exercise. Pt able to communicate how the 2 types of insulin work.  Pt expressed understanding of the information reviewed via feedback method.    Lab Results  Component Value Date   HGBA1C 8.6* 07/06/2015   Wt Readings from Last 3 Encounters:  02/10/16 251 lb 8.7 oz (114.1 kg)  02/08/16 255 lb 4.7 oz (115.8 kg)  02/03/16 255 lb 4.7 oz (115.8 kg)    Nutrition Diagnosis ? Food-and nutrition-related knowledge deficit related to lack of exposure to information as related to diagnosis of pulmonary disease ? Obesity related to excessive energy intake as evidenced by a BMI of 33.6 ?  Nutrition Rx/Est. Daily Nutrition Needs for: ? wt loss 1800-2300 Kcal  95-115 gm protein   1500 mg or less sodium  Nutrition Intervention ? Pt's individual nutrition plan and goals reviewed with pt. ? Benefits of adopting healthy eating habits discussed when pt's Rate Your Plate reviewed. ? Pt encourage to resume Novolog on non-exercise days ? Pt to resume dinner Novolog on exercise days ? Pt to  attend the Nutrition and Lung Disease class ? Continual client-centered nutrition education by RD, as part of interdisciplinary care.  Goal(s) 1. Identify food quantities necessary to achieve wt loss of  -2# per week to a goal wt loss of 2.7-10.9 kg (6-24 lb) at graduation from pulmonary rehab. 2. Describe the benefit of including fruits, vegetables, whole grains, and low-fat dairy products in a healthy meal plan. 3. Use pre-/post meal and/or exercise CBG's and A1c to determine whether adjustments in food/meal planning will be beneficial or if any meds need to be combined with nutrition therapy. Monitor and Evaluate progress toward nutrition goal with team.   Mickle PlumbEdna Rajan Burgard, M.Ed, RD, LDN, CDE 02/10/2016 1:42 PM

## 2016-02-10 NOTE — Telephone Encounter (Signed)
Pulmonary rehab is now closed as of 5pm Will leave note to call first thing in the morning and then route message back to MR

## 2016-02-10 NOTE — Telephone Encounter (Signed)
[facility]. States that an oximizer will not work due to the deliver system. He uses an [employer] system at home with exertion. They are trying to find other avenues for him because [facility] will not do his transplant until he loses this weight.

## 2016-02-11 NOTE — Telephone Encounter (Signed)
Spoke with pulmonary rehab and Daniel Nonesortia is off today 02/11/16.  Will forward back to MR to let him know.

## 2016-02-14 NOTE — Telephone Encounter (Signed)
Jerry Montgomery is not working today. Will call pulmonary rehab tomorrow.

## 2016-02-15 ENCOUNTER — Encounter (HOSPITAL_COMMUNITY)
Admission: RE | Admit: 2016-02-15 | Discharge: 2016-02-15 | Disposition: A | Discharge status: Home / Self Care | Payer: PPO | Source: Ambulatory Visit | Attending: Internal Medicine | Admitting: Internal Medicine

## 2016-02-15 VITALS — Wt 252.2 lb

## 2016-02-15 DIAGNOSIS — J84112 Idiopathic pulmonary fibrosis: Secondary | ICD-10-CM

## 2016-02-15 NOTE — Progress Notes (Signed)
Daily Session Note  Patient Details  Name: Jerry Montgomery MRN: [id number] Date of Birth: [birth year] Referring Provider:    Encounter Date: 02/15/2016  Check In:     Session Check In - 02/15/16 1213    Check-In   Location [facility]   Staff Present Rosebud Poles, RN, Luisa Hart, RN, BSN;Molly diVincenzo, MS, ACSM RCEP, Exercise Physiologist;Delaynie Stetzer Ysidro Evert, RN;Joann Rion, RN, [employer] physician immediately available to respond to emergencies [facility] immediately available   Physician(s) Dr. Marily Memos   Medication changes reported     No   Fall or balance concerns reported    No   Warm-up and Cool-down Performed as group-led instruction   Resistance Training Performed Yes   VAD Patient? No   Pain Assessment   Currently in Pain? No/denies   Multiple Pain Sites No      Capillary Blood Glucose: No results found for this or any previous visit (from the past 24 hour(s)).     POCT Glucose - 02/15/16 1215    POCT Blood Glucose   Pre-Exercise 256 mg/dL   Post-Exercise 150 mg/dL         Exercise Prescription Changes - 02/15/16 1200    Response to Exercise   Blood Pressure (Admit) 104/60 mmHg   Blood Pressure (Exercise) 132/66 mmHg   Blood Pressure (Exit) 92/60 mmHg   Heart Rate (Admit) 65 bpm   Heart Rate (Exercise) 94 bpm   Heart Rate (Exit) 75 bpm   Oxygen Saturation (Admit) 98 %   Oxygen Saturation (Exercise) 89 %   Oxygen Saturation (Exit) 94 %   Rating of Perceived Exertion (Exercise) 11   Perceived Dyspnea (Exercise) 1   Duration Progress to 45 minutes of aerobic exercise without signs/symptoms of physical distress   Intensity THRR unchanged   Progression   Progression Continue to progress workloads to maintain intensity without signs/symptoms of physical distress.   Resistance Training   Training Prescription Yes   Weight blue bands   Reps 10-12   Interval Training   Interval Training No   Oxygen   Oxygen Continuous   Liters 6   Bike   Level 3.5   Minutes 15   NuStep   Level 5   Minutes 15   METs 4.5   Track   Laps 12   Minutes 15     Goals Met:  Exercise tolerated well No report of cardiac concerns or symptoms Strength training completed today  Goals Unmet:  Not Applicable  Comments: Service time is from 1030 to 1205    Dr. Rush Farmer is Medical Director for Pulmonary Rehab at [facility].

## 2016-02-15 NOTE — Telephone Encounter (Signed)
Spoke with Portia at [facility], states she is ok with managing 02 sats dropping to 80% at rehab but they are more concerned with the fact that he does not have adequate 02 at home, and pt would like to exercise at home also.  Daniel Nonesortia states she will be speaking to MR about this either later this evening or early tomorrow morning as MR is working nights this week.  MR please advise.  Thanks .

## 2016-02-17 ENCOUNTER — Encounter (HOSPITAL_COMMUNITY)
Admission: RE | Admit: 2016-02-17 | Discharge: 2016-02-17 | Disposition: A | Discharge status: Home / Self Care | Payer: PPO | Source: Ambulatory Visit | Attending: Internal Medicine | Admitting: Internal Medicine

## 2016-02-17 VITALS — Wt 253.5 lb

## 2016-02-17 DIAGNOSIS — J84112 Idiopathic pulmonary fibrosis: Secondary | ICD-10-CM | POA: Diagnosis not present

## 2016-02-17 NOTE — Progress Notes (Signed)
Daily Session Note  Patient Details  Name: Jerry Montgomery MRN: [id number] Date of Birth: Nov 26, 1955 Referring Provider:    Encounter Date: 02/17/2016  Check In:     Session Check In - 02/17/16 1030    Check-In   Location [facility]   Staff Present Rosebud Poles, RN, BSN;Molly diVincenzo, MS, ACSM RCEP, Exercise Physiologist;Lisa Ysidro Evert, Felipe Drone, RN, MHA;Almalik Weissberg Rollene Rotunda, RN, BSN   Supervising physician immediately available to respond to emergencies [facility] immediately available   Physician(s) Dr. Eliseo Squires   Medication changes reported     No   Fall or balance concerns reported    No   Warm-up and Cool-down Performed as group-led instruction   Resistance Training Performed Yes   VAD Patient? No   Pain Assessment   Currently in Pain? No/denies   Multiple Pain Sites No      Capillary Blood Glucose: No results found for this or any previous visit (from the past 24 hour(s)).     POCT Glucose - 02/17/16 1226    POCT Blood Glucose   Pre-Exercise 157 mg/dL   Post-Exercise 84 mg/dL  [place]   Post-Exercise #2 106 mg/dL         Exercise Prescription Changes - 02/17/16 1215    Response to Exercise   Blood Pressure (Admit) 100/66 mmHg   Blood Pressure (Exercise) 122/64 mmHg   Blood Pressure (Exit) 120/70 mmHg   Heart Rate (Admit) 56 bpm   Heart Rate (Exercise) 85 bpm   Heart Rate (Exit) 68 bpm   Oxygen Saturation (Admit) 99 %   Oxygen Saturation (Exercise) 88 %   Oxygen Saturation (Exit) 98 %   Rating of Perceived Exertion (Exercise) 13   Perceived Dyspnea (Exercise) 1   Duration Progress to 45 minutes of aerobic exercise without signs/symptoms of physical distress   Intensity THRR unchanged   Progression   Progression Continue to progress workloads to maintain intensity without signs/symptoms of physical distress.   Resistance Training   Training Prescription Yes   Weight blue bands   Reps 10-12   Interval Training   Interval  Training No   Oxygen   Oxygen Continuous   Liters 8   Bike   Level 3.5   Minutes 15   NuStep   Level 5   Minutes 15   METs 3.9   Track   Laps 14   Minutes 15     Goals Met:  Independence with exercise equipment Improved SOB with ADL's Using PLB without cueing & demonstrates good technique Changing diet to healthy choices, watching portion sizes Exercise tolerated well No report of cardiac concerns or symptoms Strength training completed today  Goals Unmet:  Not Applicable  Comments: Service time is from 1030 to 1205   Dr. Rush Farmer is Medical Director for Pulmonary Rehab at [facility].

## 2016-02-18 NOTE — Telephone Encounter (Signed)
MR please advise. Thanks! 

## 2016-02-22 ENCOUNTER — Encounter (HOSPITAL_COMMUNITY)
Admission: RE | Admit: 2016-02-22 | Discharge: 2016-02-22 | Disposition: A | Discharge status: Home / Self Care | Payer: PPO | Source: Ambulatory Visit | Attending: Internal Medicine | Admitting: Internal Medicine

## 2016-02-22 VITALS — Wt 246.0 lb

## 2016-02-22 DIAGNOSIS — J84112 Idiopathic pulmonary fibrosis: Secondary | ICD-10-CM | POA: Diagnosis not present

## 2016-02-22 NOTE — Telephone Encounter (Signed)
D/w portia 10:20 AM 02/22/2016   - he needs weight loss for transplant - for this he needs diet modification - pls give him first avail so I can do diet advise for him  - also needs o2 6-8L o2 for exercise [facility] -> Portial will research best option for him and get back to me to place order  Dr. Kalman ShanMurali Annalia Metzger, M.D., [facility] Pulmonary and Critical Care Medicine Staff Physician [facility] [place] [facility] Pager: [phone], If no answer or between  15:00h - 7:00h: call [phone]  02/22/2016 10:22 AM

## 2016-02-22 NOTE — Telephone Encounter (Signed)
Lm with family member for pt tcb to schedule first available appt with MR. [facility]

## 2016-02-22 NOTE — Telephone Encounter (Signed)
MR please advise if this message has been completed.  Thanks!

## 2016-02-22 NOTE — Progress Notes (Signed)
Daily Session Note  Patient Details  Name: Jerry Montgomery MRN: [id number] Date of Birth: Jan 22, 1956 Referring Provider:    Encounter Date: 02/22/2016  Check In:     Session Check In - 02/22/16 1227    Check-In   [facility]   Staff Present Rosebud Poles, RN, Luisa Hart, RN, BSN;Molly diVincenzo, MS, ACSM RCEP, Exercise Physiologist;Olinty Celesta Aver, MS, ACSM CEP, Exercise Physiologist   Supervising physician immediately available to respond to emergencies [facility] immediately available   Physician(s) Dr. Marily Memos   Medication changes reported     No   Fall or balance concerns reported    No   Warm-up and Cool-down Performed as group-led instruction   Resistance Training Performed Yes   VAD Patient? No   Pain Assessment   Currently in Pain? No/denies   Multiple Pain Sites No      Capillary Blood Glucose: No results found for this or any previous visit (from the past 24 hour(s)).     POCT Glucose - 02/22/16 1228    POCT Blood Glucose   Pre-Exercise 157 mg/dL   Post-Exercise 147 mg/dL         Exercise Prescription Changes - 02/22/16 1200    Exercise Review   Progression Yes   Response to Exercise   Blood Pressure (Admit) 132/70 mmHg   Blood Pressure (Exercise) 160/76 mmHg   Blood Pressure (Exit) 122/70 mmHg   Heart Rate (Admit) 54 bpm   Heart Rate (Exercise) 99 bpm   Heart Rate (Exit) 67 bpm   Oxygen Saturation (Admit) 98 %   Oxygen Saturation (Exercise) 82 %   Oxygen Saturation (Exit) 97 %   Rating of Perceived Exertion (Exercise) 13   Perceived Dyspnea (Exercise) 3   Duration Progress to 45 minutes of aerobic exercise without signs/symptoms of physical distress   Intensity THRR unchanged   Progression   Progression Continue to progress workloads to maintain intensity without signs/symptoms of physical distress.   Resistance Training   Training Prescription Yes   Weight blue bands   Reps 10-12   Interval Training   Interval Training No   Oxygen   Oxygen Continuous   Liters 8   Bike   Level 4   Minutes 15   NuStep   Level 5   Minutes 15   METs 3.6   Track   Laps 11   Minutes 15     Goals Met:  Exercise tolerated well No report of cardiac concerns or symptoms Strength training completed today  Goals Unmet:  O2 Sat Sat on track dropped to 82%> O2 increased to 8L  Comments: Service time is from 1030 to 1210    Dr. Rush Farmer is Medical Director for Pulmonary Rehab at [facility].

## 2016-02-23 NOTE — Telephone Encounter (Signed)
I spoke with the pt and scheduled appt with MR for 02/25/16  [facility]

## 2016-02-24 ENCOUNTER — Encounter (HOSPITAL_COMMUNITY)
Admission: RE | Admit: 2016-02-24 | Discharge: 2016-02-24 | Disposition: A | Discharge status: Home / Self Care | Payer: PPO | Source: Ambulatory Visit | Attending: Internal Medicine | Admitting: Internal Medicine

## 2016-02-24 DIAGNOSIS — J84112 Idiopathic pulmonary fibrosis: Secondary | ICD-10-CM | POA: Diagnosis not present

## 2016-02-24 NOTE — Telephone Encounter (Signed)
approved

## 2016-02-24 NOTE — Telephone Encounter (Signed)
Pulmonary rehab is now closed. They will need to be called in the morning.

## 2016-02-24 NOTE — Telephone Encounter (Signed)
[facility] She states the pt is exercising on 8L at [facility] rehab. She states that she [facility] what [facility] be [facility] She states [facility] told her it would a Etank with a Ecart and it will go up to 15L continuous. She explained that the pt does not use 15L at this time but she feels that because he does have pulmonary fibrosis that this tank would be the best fit for the long haul. She states that the patient also needs two oximyzer one for home and one for pulmonary rehab ordered as well. I explained to her that I would send a message to MR for approval. She voiced understanding and had no further questions.  MR please advise

## 2016-02-24 NOTE — Progress Notes (Signed)
Daily Session Note  Patient Details  Name: Jerry Montgomery MRN: [id number] Date of Birth: 07/10/1956 Referring Provider:    Encounter Date: 02/24/2016  Check In:     Session Check In - 02/24/16 1030    Check-In   [facility]   Staff Present Joan Behrens, RN, BSN;Lisa Hughes, RN;Portia Payne, RN, BSN;Molly diVincenzo, MS, ACSM RCEP, Exercise Physiologist   Supervising physician immediately available to respond to emergencies [facility] immediately available   Physician(s) Dr. Elgergawy   Medication changes reported     No   Fall or balance concerns reported    No   Warm-up and Cool-down Performed as group-led instruction   Resistance Training Performed Yes   VAD Patient? No   Pain Assessment   Currently in Pain? No/denies   Multiple Pain Sites No      Capillary Blood Glucose: No results found for this or any previous visit (from the past 24 hour(s)).     POCT Glucose - 02/24/16 1234    POCT Blood Glucose   Pre-Exercise 128 mg/dL  given a banana pre exercise   Post-Exercise 133 mg/dL         Exercise Prescription Changes - 02/24/16 1200    Response to Exercise   Blood Pressure (Admit) 120/64 mmHg   Blood Pressure (Exercise) 140/80 mmHg   Blood Pressure (Exit) 100/60 mmHg   Heart Rate (Admit) 63 bpm   Heart Rate (Exercise) 88 bpm   Heart Rate (Exit) 66 bpm   Oxygen Saturation (Admit) 97 %   Oxygen Saturation (Exercise) 88 %   Oxygen Saturation (Exit) 98 %   Rating of Perceived Exertion (Exercise) 13   Perceived Dyspnea (Exercise) 1   Duration Progress to 45 minutes of aerobic exercise without signs/symptoms of physical distress   Intensity THRR unchanged   Progression   Progression Continue to progress workloads to maintain intensity without signs/symptoms of physical distress.   Resistance Training   Training Prescription Yes   Weight blue bands   Reps 10-12   Interval Training   Interval Training No   Oxygen   Oxygen  Continuous   Liters 8   Bike   Level 4   Minutes 15   NuStep   Level 5   Minutes 15   METs 2.9     Goals Met:  Independence with exercise equipment Improved SOB with ADL's Using PLB without cueing & demonstrates good technique Exercise tolerated well Strength training completed today  Goals Unmet:  Not Applicable  Comments: Service time is from 1030 to 1230    Dr. Wesam G. Yacoub is Medical Director for Pulmonary Rehab at [facility]. 

## 2016-02-24 NOTE — Telephone Encounter (Signed)
Molly from Pulmonary rehab called.  [facility] be best for the patient.  She wants to relay info for order.  CB at [phone].

## 2016-02-24 NOTE — Telephone Encounter (Signed)
LM for Portia to return call.  

## 2016-02-25 ENCOUNTER — Telehealth: Payer: Self-pay | Admitting: Internal Medicine

## 2016-02-25 ENCOUNTER — Encounter: Payer: Self-pay | Admitting: Internal Medicine

## 2016-02-25 ENCOUNTER — Ambulatory Visit (INDEPENDENT_AMBULATORY_CARE_PROVIDER_SITE_OTHER): Payer: PPO | Admitting: Internal Medicine

## 2016-02-25 VITALS — BP 126/78 | HR 76 | Ht 73.0 in | Wt 259.0 lb

## 2016-02-25 DIAGNOSIS — E669 Obesity, unspecified: Secondary | ICD-10-CM

## 2016-02-25 DIAGNOSIS — J84112 Idiopathic pulmonary fibrosis: Secondary | ICD-10-CM

## 2016-02-25 DIAGNOSIS — J9611 Chronic respiratory failure with hypoxia: Secondary | ICD-10-CM

## 2016-02-25 MED ORDER — ALBUTEROL SULFATE HFA 108 (90 BASE) MCG/ACT IN AERS: 2.0000 | INHALATION_SPRAY | Freq: Four times a day (QID) | RESPIRATORY_TRACT | Status: DC | PRN

## 2016-02-25 NOTE — Assessment & Plan Note (Signed)
Weight is barrier to qualifying for transplant Unable to exercise to meet weight goal with current oxygen delivery system. Plan:

## 2016-02-25 NOTE — Telephone Encounter (Signed)
Elise  pls call pcp PICKARD,[name], MD on Monday 02/28/16 and infrom him of my  Note sent to him and low glycemic diet plan. And DR Tanya NonesPickard needs to manage patient insulin . IF Dr Tanya NonesPickard wants to discuss diet plan then he can call me on my cell. Want to make sure we are on same page. PAtient knows he wil have to monitor sugars aggrssively and even reduce insulin dosage  Dr. Kalman ShanMurali Dilara Navarrete, M.D., [facility] Pulmonary and Critical Care Medicine Staff Physician [facility] [place] Pulmonary and Critical Care Pager: [phone], If no answer or between  15:00h - 7:00h: call [phone]  02/25/2016 5:30 PM

## 2016-02-25 NOTE — Patient Instructions (Addendum)
It is good to see you today. We will place an order to get you an oxygen device that will meet your needs for oxygen for exercise through pulmonary rehab.  - etank with ecart that lets you go upto 15L [place] Continue working hard on weight loss. Continue oxygen as before Per [facility] transplant team continue working on weight loss. Goal weight is 230 pounds. Continue [place] pulmonary rehabilitation program. Dr. Waynard Edwards is going to talk with you about low carb diet to help with weight loss.. Follow up with [facility] in June 2017 for spirometry and dlco.  ........Marland Kitchen Regarding weight loss needed for transplatn  - laid out low glycemic plan for you  - monitor your sugars very closely - to make sure you are not hypoglycemic     - you might have to cut down your insulin dosage ahead of time while starting this diet - Ifgetting  hypoglycemic back off on insulin or stop diet - will inform PICKARD,[name], MD about the diet plan   #WEight Management    - we discussed extensively about weight management   -  - General  - drink lot of water  - avoid all moderate and high glycemic foods especially bad fruits, breads, pastas, fried foods, battered foods, sugary foods (these are the food in centerl and right lane that you have to avoid)  - make non-starchy vegetables your base in terms of volume you eat; 50% of what you see on the plate and goes into your mouth should be these vegetables (the vegetables in the left lane)  - always make sure you balance good carbs, good protein and good fat source  - good carbs are non-starchy vegetables, uncanned beans in the left column and low glycemic fruits in the left colum  - good protein source is egg white, beans, tofu, fish, chicken breast, fish, [place] and bison. Remember meat has to be skinless  - good healthy fat source is nuts, and fish   - focusing on eating right healthy foods (left lane) and avoiding unhealthy foods (middle and right  lane) is better way to lose weight than to go hypo-caloric  - focus on staying full by eating right  - having a daily and weekly plan for what you will eat and where you will eat depending on your work, social life schedule is very important   - watch out for misleading labels on grocery aisle: High Fiber and Low Fat labeled foods generally are high in bad carbs or sugar  - measure weight once  a week  - discipline and attitude is key. Do not care for anyone else's opinion or feelings. Only yours matters   - For breakfast  - most important meal of the day. So, eat daily breakfast. Do not skip   - recommend 1/2 to 1 cup steel cut oat meal or 1/2 to 1 cup fiber one 60 cal   Or  1 to 1.5 cups Kashi go-lean with non-fat plain milk or 60 Cal Silk Soy mild. Can add Berries. Can have egg at same time for breakfast  - For snacks  - recommend total 2-3 snacks per day  - snack should be light and filling  - best times are between breakfast and lunch, lunch and dinner and sometimes post-dinner snack  - Nut are great snacks. Stick to low glycemic nuts (less than 50gm per day) and eat only the nuts in the left lane like peanuts, pista, almond, walnut  -  Low glycemic fruits are great snacks. Have 1-2 servings each day of fruits from the left lane   - If you like yogurt or cottage cheese - recommend Oikos or Fage 0% greek yogourt or Plan non-fat yogurt or Breakstone non-fat cottage cheese. Theyse have the least sugar. Do no exceed 100-200 gram per day. Fruit yogurts are the worst  - Good Protein Shakes are good snacks:  EAS Abbot Whey Powder shake, EAS Myoplex lite, EAS Carb Control. Muscle Milk Shake  - For Lunch and dinner  - unlimited non-starchy vegetable (prefer raw fresh or roasted or grilled) with skinless chicken or fish  - Special Notes  - Nuts: Nut are great snacks and have heart benefits. Stick to low glycemic nuts (less than 50gm per day) and eat only the nuts in the left lane like peanuts,  pista, almond, walnuts  -  Ok to eat above nuts daily but only < 50gm/day  - If you eat more than 50gm/day then you run risk of eating too many calories or saturated fat  - AVoid nuts glazed with sugar. Nuts have to be in salted/original form or roasted   - Fruits: Eat 1-2 fresh fruit servings daily but fruits can be dangerous because of high sugar content. So, choose your fruits wisely. Eat only the low glycemic fruits (left lane). Eat them fresh.  Do not eat them canned  - Avoid all fresh juices except if you use the low glycemic fruits and make them yourself without adding extra sugar   - Dairy: Is optional. Eat zero fat or low fat, fruit free yogurts or cottage cheese but not more than 100-200g per day  - Restaurant  - all restaurants have bad and good choices. Even fast food restaurants offer you good choices  - at restaurants do no fall prey to social pressure.. One way to eat healthy at restaurant is to eat healthy snack or light healthy meal before you go to restaurant so that will prevent your cravings  - Restaurants with worst choices: Tim[place]ican (except Chipotle, or [place]), Congochinese, Bangladeshindian. At these restaurants avoid the bread, curry, fried and battered foods and chips  - Restaurants with best choices: greek, mid-east, [place], Sudan[place]ian (again here avoid bread, deep fried stuffed and pasta)  - Restaurants with Ok choice: McDonald's, [employer], Apple[employer]es (again here avoid the bread, fried stuff, fried meat)  - Always ask for grilled meat or vegetables, and fresh salad choices (get your salad dressing as low fat and to the side)  - For Will Power  - Prepare, prepare, prepare: Plan your day and think of what you will eat and when you will eat and where you will eat.  - Snack good stuff to keep yourself full to avoid hunger and losing will power  - 1 minute fast walk when feeling cravings could  Help  -  Eat breakfast  - Berries are excellent to control sugar cravings  - Proteins can  prevent cravings  - Fats like nuts keep you full and prevent cravings  - Avoid hunger; stay ahead by eating small amounts of the right food  - Avoid hunger; stay ahead by eating frequently  - Drink lot of water  - Eat slower

## 2016-02-25 NOTE — Progress Notes (Signed)
Subjective:    Patient ID: Jerry Montgomery, male    DOB: [birth year], 60 y.o.   MRN: [id number]  HPI   60 year old male former smoker with hx of ILD  Hx of PTX s/p VATS pleurodesis in 2013.   02/25/2016  Follow up for weight loss and exercise in order to qualify for Lung  Transplant.at [facility]. Current weight is 259 lbs, but needs to be ay 230 to qualify for transplant. Jerry Montgomery is frustrated with the fact his oxygen needs for rehab are not met by his current oxygen system.He does not want to lose his mobility, but he does want to have adequate oxygen to exercise and complete rehab.He is working hard at rehab, and is working on decreasing the carbs in his diet. He is diabetic, which makes dieting difficult.He is doing well other than some fatigue, he denies  fever, chest pain, orthopnea, hemoptysis or leg or calf pain.   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 ANA autoimmune panel was negative, with trial of Esbriet but unable to tolerate.  02/2015  Autpimmune ab panel 03/09/15 and HP panel - all negative. C-anca trace positive but mpo/pr3 negative  PFT fvc 3.5L/64%, Ratio 72, TLC 6L/76%, DLCO 15/39% - moderate restriction. (Worse to stable compared to 2013 when FVC was 4.1 L/78% and TLC was 6 L DLCO was 16/54%]  CT chest 03/18/2015 repirted 03/24/2015 i sAGAINST UIP and is worse compared to 2014. Other findings on CT - enlarged PA artery - echo 2014 showed PASP 40s and co art calcification - stress lexiscan 2014 was normal.   Right heart catheterization done 05/18/2015 - Report reviewed personally cardiac output 5.84 L/m. Cardiac index 2.45 L/m per BSA, pulmonary arteries with mean pressure 18 mm and a wedge of 7. Reported as normal   Walk test 185 feet 3 laps on room air - WE left him on room air for 20 minutes and then walked him 185 feet 3 laps. He desaturated only on the second lap to below  88%.   Current outpatient prescriptions:  .  ALPRAZolam (XANAX) 0.5 MG tablet, Take 1 tablet (0.5 mg total) by mouth 3 (three) times daily as needed for anxiety., Disp: 90 tablet, Rfl: 0 .  aspirin 81 MG tablet, Take 81 mg by mouth daily., Disp: , Rfl:  .  atorvastatin (LIPITOR) 40 MG tablet, Take 1 tablet by mouth  every night at bedtime, Disp: 90 tablet, Rfl: 1 .  blood glucose meter kit and supplies KIT, Dispense based on patient and insurance preference. Use up to four times daily as directed. (FOR ICD E11.65), Disp: 1 each, Rfl: 0 .  Blood Glucose Monitoring Suppl (ONE TOUCH ULTRA SYSTEM KIT) w/Device KIT, Use to monitor FSBS 4x daily for fluctuating blood sugars. Dx: E11.65., Disp: 1 each, Rfl: 1 .  clopidogrel (PLAVIX) 75 MG tablet, Take 1 tablet by mouth  every day, Disp: 90 tablet, Rfl: 3 .  cyclobenzaprine (FLEXERIL) 10 MG tablet, Take 1 tablet (10 mg total) by mouth 3 (three) times daily as needed for muscle spasms., Disp: 30 tablet, Rfl: 0 .  diltiazem (CARDIZEM) 30 MG tablet, Take 1 tablet (30 mg total) by mouth every 6 (six) hours., Disp: 360 tablet, Rfl: 1 .  furosemide (LASIX) 20 MG tablet, Take 1 tablet (20 mg total) by mouth daily as needed for fluid (fluid)., Disp: 90 tablet, Rfl: 3 .  glucose blood test strip, Use to  monitor FSBS 4x daily for fluctuating blood sugars. Dx: E11.65., Disp: 400 each, Rfl: 4 .  insulin aspart (NOVOLOG) 100 UNIT/ML injection, 10 UNITS WITH BREAKFAST AND 10 UNITS WITH LUNCH, 30 UNITS WITH SUPPER, Disp: 60 mL, Rfl: 4 .  Insulin Glargine (LANTUS SOLOSTAR) 100 UNIT/ML Solostar Pen, Inject 60 Units into the skin at bedtime., Disp: 15 mL, Rfl: 11 .  Insulin Pen Needle (B-D ULTRAFINE III SHORT PEN) 31G X 8 MM MISC, Use as directed, Disp: 200 each, Rfl: 3 .  isosorbide mononitrate (IMDUR) 60 MG 24 hr tablet, Take 1 and 1/2 tablets by  mouth every day (Patient taking differently: Take 60 mg by  mouth every day), Disp: 135 tablet, Rfl: 1 .  levothyroxine  (SYNTHROID, LEVOTHROID) 125 MCG tablet, Take 1 tablet (125 mcg total) by mouth daily., Disp: 90 tablet, Rfl: 3 .  Menthol, Topical Analgesic, (BIOFREEZE EX), Apply 1 application topically as needed (for pain)., Disp: , Rfl:  .  metFORMIN (GLUCOPHAGE) 850 MG tablet, Take 1 tablet by mouth  twice a day, Disp: 180 tablet, Rfl: 3 .  metoprolol (TOPROL XL) 200 MG 24 hr tablet, Take 1 tablet (200 mg total) by mouth daily., Disp: 90 tablet, Rfl: 3 .  nitroGLYCERIN (NITROSTAT) 0.4 MG SL tablet, Place 1 tablet (0.4 mg total) under the tongue every 5 (five) minutes as needed for chest pain., Disp: 25 tablet, Rfl: 1 .  SYRINGE/NEEDLE, DISP, 1 ML (B-D SYRINGE/NEEDLE 1CC/25GX5/8) 25G X 5/8" 1 ML MISC, As directed, Disp: 100 each, Rfl: 5 .  ADVAIR HFA 115-21 MCG/ACT inhaler, Inhale 2 puffs into the lungs 2 (two) times daily. Reported on 02/25/2016, Disp: , Rfl:  .  albuterol (PROVENTIL HFA;VENTOLIN HFA) 108 (90 Base) MCG/ACT inhaler, Inhale 2 puffs into the lungs every 6 (six) hours as needed for wheezing. (Patient not taking: Reported on 02/25/2016), Disp: 1 Inhaler, Rfl: 5 .  tiotropium (SPIRIVA) 18 MCG inhalation capsule, Place 18 mcg into inhaler and inhale daily. Reported on 02/25/2016, Disp: , Rfl:  .  zolpidem (AMBIEN) 10 MG tablet, Take 1 tablet (10 mg total) by mouth at bedtime as needed for sleep., Disp: 90 tablet, Rfl: 1   Past Medical History  Diagnosis Date  . Hypertension   . Hyperlipidemia   . Postinflammatory pulmonary fibrosis ([place])   . CAD (coronary artery disease)   . Pneumothorax on right 4/11  . COPD (chronic obstructive pulmonary disease) ([place])   . Atrial tachycardia ([place])   . Acute diastolic heart failure ([place])   . On home oxygen therapy     "3L; 24/7" (04/15/2015)  . Sleep apnea     "won't use CPAP" (04/14/2105)  . Hypothyroidism   . Type II diabetes mellitus ([place])   . WPW (Wolff-Parkinson-White syndrome)     Per Dr. Tanna Furry Note  . SVT (supraventricular tachycardia) ([place])     Per  Dr. Tanna Furry note  . Atrial fibrillation (HCC)     Allergies  Allergen Reactions  . Niaspan [Niacin] Itching  . Ofev [Nintedanib]   . Pirfenidone   . Altace [Ramipril] Other (See Comments)    Cough     Review of Systems Constitutional:   No  weight loss, night sweats,  Fevers, chills, + fatigue, or  lassitude.  HEENT:   No headaches,  Difficulty swallowing,  Tooth/dental problems, or  Sore throat,                No sneezing, itching, ear ache, nasal congestion, post nasal drip,  CV:  No chest pain,  Orthopnea, PND, swelling in lower extremities, anasarca, dizziness, palpitations, syncope.   GI  No heartburn, indigestion, abdominal pain, nausea, vomiting, diarrhea, change in bowel habits, loss of appetite, bloody stools.   Resp: + shortness of breath with exertion or at rest.  No excess mucus, no productive cough,  No non-productive cough,  No coughing up of blood.  No change in color of mucus.  No wheezing.  No chest wall deformity  Skin: no rash or lesions.  GU: no dysuria, change in color of urine, no urgency or frequency.  No flank pain, no hematuria   MS:  No joint pain or swelling.  No decreased range of motion.  No back pain.  Psych:  No change in mood or affect. No depression or anxiety.  No memory loss.        Objective:   Physical Exam BP 126/78 mmHg  Pulse 76  Ht _0  (1.854 m)  Wt 259 lb (117.482 kg)  BMI 34.18 kg/m2  SpO2 90%  Physical Exam:  General- No distress,  A&Ox3, pleasant ENT: No sinus tenderness, TM clear, pale nasal mucosa, no oral exudate,no post nasal drip, no LAN Cardiac: S1, S2, regular rate and rhythm, no murmur Chest: No wheeze/ + crackles / diminished in bases; no accessory muscle use, no nasal flaring, no sternal retractions Abd.: Soft Non-tender Ext: No clubbing cyanosis, edema Neuro:  normal strength Skin: No rashes, warm and dry Psych: normal mood and behavior      Assessment & Plan:  STAFF NOTE: I, Dr Ann Lions have  personally reviewed patient's available data, including medical history, events of note, physical examination and test results as part of my evaluation. I have discussed with resident/NP and other care providers such as pharmacist, RN and RRT.  In addition,  I personally evaluated patient and elicited key findings of   S: worsened hypxoemia. Needing 8L at rehab. Wants to lose weight so he can qualify for lung transplant (disqualified for lung tx due to weight) . Unable to exercise of follow diet that can make him lose weight. Is diabetic and uses insulin  O: Alert and oriented x 3 On o2 Crackl;es +  Estimated body mass index is 34.18 kg/(m^2) as calculated from the following:   Height as of this encounter: _1  (1.854 m).   Weight as of this encounter: 259 lb (117.482 kg).  Allergies  Allergen Reactions  . Niaspan [Niacin] Itching  . Ofev [Nintedanib]   . Pirfenidone   . Altace [Ramipril] Other (See Comments)    Cough       A:    ICD-9-CM ICD-10-CM   1. IPF (idiopathic pulmonary fibrosis) (HCC) 516.31 J84.112 albuterol (PROVENTIL HFA;VENTOLIN HFA) 108 (90 Base) MCG/ACT inhaler     Ambulatory Referral for DME     Pulmonary function test  2. Chronic respiratory failure with hypoxia (HCC) 518.83 J96.11    799.02    3. Obesity 278.00 E66.9      P:  - - Etank with ecart advised by rehab - can go uto 15L - order placed  - discusssed low glycemic diet - low carb - he wants to do it. He will need to monitor his sugars very closely and even back off on insulin . He accepts risk. He knows how to handle hypoglycemia he says. Will inform Dr Dennard Schaumann of this plan and ensure we have buy in . Dr Dennard Schaumann office is close as of 5:28 PM 02/25/2016  Friday.  If Pickard not in agreement with diet -then cancel diet plan    > 50% of this > 40 min visit spent in face to face counseling or/and coordination of care     .  Rest per NP/medical resident whose note is outlined above and that I agree  with   Dr. Brand Males, M.D., [facility].C.P Pulmonary and Critical Care Medicine Staff Physician [facility] Pager: [phone], If no answer or between  15:00h - 7:00h: call [phone]  02/25/2016 5:17 PM

## 2016-02-25 NOTE — Telephone Encounter (Signed)
Pulmonary Rehab is not open today. Will call back Monday.

## 2016-02-28 NOTE — Telephone Encounter (Signed)
Called Dr. Felisa BonierPickards office at [phone] at 5:05pm on 02/28/2016 - office is closed. Will call back on 02/29/2016.

## 2016-02-28 NOTE — Telephone Encounter (Signed)
Left message for Molly to return call

## 2016-02-29 ENCOUNTER — Encounter (HOSPITAL_COMMUNITY)
Admission: RE | Admit: 2016-02-29 | Discharge: 2016-02-29 | Disposition: A | Discharge status: Home / Self Care | Payer: PPO | Source: Ambulatory Visit | Attending: Internal Medicine | Admitting: Internal Medicine

## 2016-02-29 VITALS — Wt 254.2 lb

## 2016-02-29 DIAGNOSIS — J84112 Idiopathic pulmonary fibrosis: Secondary | ICD-10-CM | POA: Diagnosis not present

## 2016-02-29 NOTE — Telephone Encounter (Signed)
Spoke with Portia at pulmonary rehab. She is aware of MR's response. Order has been placed. Nothing further was needed.

## 2016-02-29 NOTE — Progress Notes (Signed)
Daily Session Note  Patient Details  Name: Jerry Montgomery MRN: [id number] Date of Birth: [birth year] Referring Provider:    Encounter Date: 02/29/2016  Check In:     Session Check In - 02/29/16 1246    Check-In   Location [facility]   Staff Present Su Hilt, MS, ACSM RCEP, Exercise Physiologist;Portia Rollene Rotunda, Therapist, sports, BSN;Ramon Dredge, RN, MHA;Lisa Ysidro Evert, RN   Supervising physician immediately available to respond to emergencies [facility] immediately available   Physician(s) Dr. Marily Memos   Medication changes reported     No   Fall or balance concerns reported    No   Warm-up and Cool-down Performed as group-led instruction   Resistance Training Performed Yes   VAD Patient? No   Pain Assessment   Currently in Pain? No/denies   Multiple Pain Sites No      Capillary Blood Glucose: No results found for this or any previous visit (from the past 24 hour(s)).     POCT Glucose - 02/29/16 1247    POCT Blood Glucose   Pre-Exercise 285 mg/dL   Post-Exercise 212 mg/dL         Exercise Prescription Changes - 02/29/16 1200    Response to Exercise   Blood Pressure (Admit) 104/60 mmHg   Blood Pressure (Exercise) 100/60 mmHg   Blood Pressure (Exit) 116/64 mmHg   Heart Rate (Admit) 63 bpm   Heart Rate (Exercise) 84 bpm   Heart Rate (Exit) 60 bpm   Oxygen Saturation (Admit) 99 %   Oxygen Saturation (Exercise) 89 %   Oxygen Saturation (Exit) 98 %   Rating of Perceived Exertion (Exercise) 11   Perceived Dyspnea (Exercise) 1   Duration Progress to 45 minutes of aerobic exercise without signs/symptoms of physical distress   Intensity THRR unchanged   Progression   Progression Continue to progress workloads to maintain intensity without signs/symptoms of physical distress.   Resistance Training   Training Prescription Yes   Weight blue bands   Reps 10-12   Oxygen   Oxygen Continuous   Liters 8   Bike   Level 4   Minutes 15   NuStep   Level 5    Minutes 15   METs 3.3   Track   Laps 10   Minutes 15     Goals Met:  Independence with exercise equipment Improved SOB with ADL's Exercise tolerated well Strength training completed today  Goals Unmet:  Not Applicable  Comments:Service time is from 1030 to 1215    Dr. Rush Farmer is Medical Director for Pulmonary Rehab at [facility].

## 2016-02-29 NOTE — Progress Notes (Signed)
I have reviewed a Home Exercise Prescription with Jerry Montgomery . Keeanna Villafranca MaduroRobert is not currently exercising at home.  The patient was advised to walk 3 days a week for 45 minutes.  Fields and I discussed how to progress their exercise prescription.  The patient stated that their goals were to be able to get down to 220 pounds, go golfing, go bowling.  The patient stated that they understand the exercise prescription.  We reviewed exercise guidelines, target heart rate during exercise, oxygen use, weather, home pulse oximeter, endpoints for exercise, and goals.  Patient is encouraged to come to me with any questions. I will continue to follow up with the patient to assist them with progression and safety.

## 2016-03-01 NOTE — Telephone Encounter (Signed)
Called Dr. Caren MacadamPickard's office and spoke to [place] and informed her of the diet change and possible need to change insulin dosage per MR. Gave Sandy MR's cell phone number in case Dr Tanya NonesPickard wants to discuss pt's diet. Sandy verbalized understanding and denied any further questions or concerns at this time.

## 2016-03-02 ENCOUNTER — Encounter (HOSPITAL_COMMUNITY)
Admission: RE | Admit: 2016-03-02 | Discharge: 2016-03-02 | Disposition: A | Discharge status: Home / Self Care | Payer: PPO | Source: Ambulatory Visit | Attending: Internal Medicine | Admitting: Internal Medicine

## 2016-03-02 VITALS — Wt 250.7 lb

## 2016-03-02 DIAGNOSIS — J84112 Idiopathic pulmonary fibrosis: Secondary | ICD-10-CM | POA: Diagnosis not present

## 2016-03-02 NOTE — Progress Notes (Signed)
Daily Session Note  Patient Details  Name: Jerry Montgomery MRN: [id number] Date of Birth: 10-09-1956 Referring Provider:    Encounter Date: 03/02/2016  Check In:     Session Check In - 03/02/16 1104    Check-In   Location [facility]   Staff Present Rosebud Poles, RN, BSN;Molly diVincenzo, MS, ACSM RCEP, Exercise Physiologist;Annedrea [name], RN, MHA;Portia Rollene Rotunda, RN, BSN   Supervising physician immediately available to respond to emergencies [facility] immediately available   Physician(s) Dr. Marily Memos   Medication changes reported     No   Fall or balance concerns reported    No   Warm-up and Cool-down Performed as group-led instruction   Resistance Training Performed Yes   VAD Patient? No   Pain Assessment   Currently in Pain? No/denies      Capillary Blood Glucose: No results found for this or any previous visit (from the past 24 hour(s)).     POCT Glucose - 03/02/16 1405    POCT Blood Glucose   Pre-Exercise 152 mg/dL   Post-Exercise 90 mg/dL         Exercise Prescription Changes - 03/02/16 1400    Response to Exercise   Blood Pressure (Admit) 104/64 mmHg   Blood Pressure (Exercise) 122/70 mmHg   Blood Pressure (Exit) 126/72 mmHg   Heart Rate (Admit) 60 bpm   Heart Rate (Exercise) 79 bpm   Heart Rate (Exit) 70 bpm   Oxygen Saturation (Admit) 96 %   Oxygen Saturation (Exercise) 90 %   Oxygen Saturation (Exit) 98 %   Rating of Perceived Exertion (Exercise) 11   Perceived Dyspnea (Exercise) 1   Duration Progress to 45 minutes of aerobic exercise without signs/symptoms of physical distress   Intensity THRR unchanged   Progression   Progression Continue to progress workloads to maintain intensity without signs/symptoms of physical distress.   Resistance Training   Training Prescription Yes   Weight blue bands   Reps 10-12   Interval Training   Interval Training No   Oxygen   Oxygen Continuous   Liters 8   NuStep   Level 5   Minutes 15   METs 4.3   Track   Laps 9   Minutes 15     Goals Met:  Independence with exercise equipment Improved SOB with ADL's Changing diet to healthy choices, watching portion sizes Exercise tolerated well Strength training completed today  Goals Unmet:  Not Applicable  Comments: Service time is from 1030 to 1230    Dr. Rush Farmer is Medical Director for Pulmonary Rehab at [facility].

## 2016-03-03 ENCOUNTER — Other Ambulatory Visit: Payer: Self-pay | Admitting: Family Medicine

## 2016-03-03 DIAGNOSIS — E039 Hypothyroidism, unspecified: Secondary | ICD-10-CM

## 2016-03-03 DIAGNOSIS — I10 Essential (primary) hypertension: Secondary | ICD-10-CM

## 2016-03-03 DIAGNOSIS — Z794 Long term (current) use of insulin: Secondary | ICD-10-CM

## 2016-03-03 DIAGNOSIS — E119 Type 2 diabetes mellitus without complications: Secondary | ICD-10-CM

## 2016-03-03 DIAGNOSIS — J8410 Pulmonary fibrosis, unspecified: Secondary | ICD-10-CM

## 2016-03-03 DIAGNOSIS — E785 Hyperlipidemia, unspecified: Secondary | ICD-10-CM

## 2016-03-03 DIAGNOSIS — IMO0001 Reserved for inherently not codable concepts without codable children: Secondary | ICD-10-CM

## 2016-03-06 ENCOUNTER — Other Ambulatory Visit: Payer: PPO

## 2016-03-06 ENCOUNTER — Telehealth: Payer: Self-pay | Admitting: Internal Medicine

## 2016-03-06 DIAGNOSIS — E039 Hypothyroidism, unspecified: Secondary | ICD-10-CM

## 2016-03-06 DIAGNOSIS — Z794 Long term (current) use of insulin: Secondary | ICD-10-CM

## 2016-03-06 DIAGNOSIS — J8410 Pulmonary fibrosis, unspecified: Secondary | ICD-10-CM | POA: Diagnosis not present

## 2016-03-06 DIAGNOSIS — IMO0001 Reserved for inherently not codable concepts without codable children: Secondary | ICD-10-CM

## 2016-03-06 DIAGNOSIS — E785 Hyperlipidemia, unspecified: Secondary | ICD-10-CM

## 2016-03-06 DIAGNOSIS — E119 Type 2 diabetes mellitus without complications: Secondary | ICD-10-CM | POA: Diagnosis not present

## 2016-03-06 DIAGNOSIS — E7800 Pure hypercholesterolemia, unspecified: Secondary | ICD-10-CM | POA: Diagnosis not present

## 2016-03-06 DIAGNOSIS — I10 Essential (primary) hypertension: Secondary | ICD-10-CM | POA: Diagnosis not present

## 2016-03-06 LAB — LIPID PANEL
CHOL/HDL RATIO: 3 ratio (ref <=5.0)
CHOLESTEROL: 92 mg/dL — AB (ref 125–200)
HDL: 31 mg/dL — ABNORMAL LOW (ref >=40)
LDL Cholesterol: 40 mg/dL (ref <130)
Triglycerides: 105 mg/dL (ref <150)
VLDL: 21 mg/dL (ref <30)

## 2016-03-06 LAB — CBC WITH DIFFERENTIAL/PLATELET
BASOS PCT: 0 %
Basophils Absolute: 0 cells/uL (ref 0–200)
Eosinophils Absolute: 228 cells/uL (ref 15–500)
Eosinophils Relative: 4 %
HEMATOCRIT: 42.9 % (ref 38.5–50.0)
HEMOGLOBIN: 13.9 g/dL (ref 13.0–17.0)
LYMPHS ABS: 1425 {cells}/uL (ref 850–3900)
Lymphocytes Relative: 25 %
MCH: 29 pg (ref 27.0–33.0)
MCHC: 32.4 g/dL (ref 32.0–36.0)
MCV: 89.6 fL (ref 80.0–100.0)
MONO ABS: 570 {cells}/uL (ref 200–950)
MPV: 9.1 fL (ref 7.5–12.5)
Monocytes Relative: 10 %
NEUTROS ABS: 3477 {cells}/uL (ref 1500–7800)
NEUTROS PCT: 61 %
Platelets: 181 10*3/uL (ref 140–400)
RBC: 4.79 MIL/uL (ref 4.20–5.80)
RDW: 14.2 % (ref 11.0–15.0)
WBC: 5.7 10*3/uL (ref 3.8–10.8)

## 2016-03-06 LAB — COMPREHENSIVE METABOLIC PANEL
ALBUMIN: 4 g/dL (ref 3.6–5.1)
ALK PHOS: 53 U/L (ref 40–115)
ALT: 19 U/L (ref 9–46)
AST: 18 U/L (ref 10–35)
BILIRUBIN TOTAL: 0.7 mg/dL (ref 0.2–1.2)
BUN: 18 mg/dL (ref 7–25)
CO2: 24 mmol/L (ref 20–31)
CREATININE: 1.29 mg/dL — AB (ref 0.70–1.25)
Calcium: 9.3 mg/dL (ref 8.6–10.3)
Chloride: 103 mmol/L (ref 98–110)
Glucose, Bld: 105 mg/dL — ABNORMAL HIGH (ref 70–99)
Potassium: 4.4 mmol/L (ref 3.5–5.3)
SODIUM: 140 mmol/L (ref 135–146)
TOTAL PROTEIN: 6.2 g/dL (ref 6.1–8.1)

## 2016-03-06 LAB — TSH: TSH: 2.72 m[IU]/L (ref 0.40–4.50)

## 2016-03-06 NOTE — Telephone Encounter (Signed)
LMTCB Per chart [facility]

## 2016-03-07 ENCOUNTER — Other Ambulatory Visit: Payer: Self-pay | Admitting: Family Medicine

## 2016-03-07 ENCOUNTER — Ambulatory Visit (INDEPENDENT_AMBULATORY_CARE_PROVIDER_SITE_OTHER): Payer: PPO | Admitting: Family Medicine

## 2016-03-07 ENCOUNTER — Encounter (HOSPITAL_COMMUNITY)
Admission: RE | Admit: 2016-03-07 | Discharge: 2016-03-07 | Disposition: A | Discharge status: Home / Self Care | Payer: PPO | Source: Ambulatory Visit | Attending: Internal Medicine | Admitting: Internal Medicine

## 2016-03-07 ENCOUNTER — Encounter: Payer: Self-pay | Admitting: Family Medicine

## 2016-03-07 VITALS — Wt 248.7 lb

## 2016-03-07 VITALS — BP 118/62 | HR 66 | Temp 98.0°F | Resp 22

## 2016-03-07 DIAGNOSIS — Z794 Long term (current) use of insulin: Secondary | ICD-10-CM

## 2016-03-07 DIAGNOSIS — E1100 Type 2 diabetes mellitus with hyperosmolarity without nonketotic hyperglycemic-hyperosmolar coma (NKHHC): Secondary | ICD-10-CM | POA: Diagnosis not present

## 2016-03-07 DIAGNOSIS — J84112 Idiopathic pulmonary fibrosis: Secondary | ICD-10-CM

## 2016-03-07 LAB — HEMOGLOBIN A1C
Hgb A1c MFr Bld: 8.9 % — ABNORMAL HIGH (ref <5.7)
MEAN PLASMA GLUCOSE: 209 mg/dL

## 2016-03-07 MED ORDER — ISOSORBIDE MONONITRATE ER 60 MG PO TB24: ORAL_TABLET | ORAL | Status: DC

## 2016-03-07 MED ORDER — INSULIN GLARGINE 100 UNIT/ML ~~LOC~~ SOLN: 60.0000 [IU] | Freq: Every day | SUBCUTANEOUS | Status: DC

## 2016-03-07 MED ORDER — ALBUTEROL SULFATE HFA 108 (90 BASE) MCG/ACT IN AERS: 2.0000 | INHALATION_SPRAY | Freq: Four times a day (QID) | RESPIRATORY_TRACT | Status: DC | PRN

## 2016-03-07 NOTE — Telephone Encounter (Signed)
lmtcb X2 for portia with pulm rehab

## 2016-03-07 NOTE — Progress Notes (Signed)
Daily Session Note  Patient Details  Name: Jerry Montgomery MRN: [id number] Date of Birth: 06-10-1956 Referring Provider:    Encounter Date: 03/07/2016  Check In:     Session Check In - 03/07/16 1017    Check-In   Location [facility]   Staff Present Rosebud Poles, RN, BSN;Darla Mcdonald, MS, ACSM RCEP, Exercise Physiologist;Annedrea [name], RN, MHA;Portia Rollene Rotunda, RN, BSN   Supervising physician immediately available to respond to emergencies [facility] immediately available   Physician(s) Dr. Marily Memos   Medication changes reported     No   Fall or balance concerns reported    No   Resistance Training Performed Yes   VAD Patient? No   Pain Assessment   Currently in Pain? No/denies   Multiple Pain Sites No      Capillary Blood Glucose: No results found for this or any previous visit (from the past 24 hour(s)).     POCT Glucose - 03/07/16 1210    POCT Blood Glucose   Pre-Exercise 168 mg/dL   Post-Exercise 131 mg/dL         Exercise Prescription Changes - 03/07/16 1200    Exercise Review   Progression Yes   Response to Exercise   Blood Pressure (Admit) 100/62 mmHg   Blood Pressure (Exercise) 132/70 mmHg   Blood Pressure (Exit) 100/60 mmHg   Heart Rate (Admit) 55 bpm   Heart Rate (Exercise) 78 bpm   Heart Rate (Exit) 62 bpm   Oxygen Saturation (Admit) 99 %   Oxygen Saturation (Exercise) 91 %   Oxygen Saturation (Exit) 98 %   Rating of Perceived Exertion (Exercise) 11   Perceived Dyspnea (Exercise) 1   Duration Progress to 45 minutes of aerobic exercise without signs/symptoms of physical distress   Intensity THRR unchanged   Progression   Progression Continue to progress workloads to maintain intensity without signs/symptoms of physical distress.   Resistance Training   Training Prescription Yes   Weight blue bands   Reps 10-12   Interval Training   Interval Training No   Oxygen   Oxygen Continuous   Liters 8   Bike   Level 4.5   Minutes 15   NuStep   Level 5   Minutes 15   METs 2.9   Track   Laps 12   Minutes 15     Goals Met:  Exercise tolerated well Strength training completed today  Goals Unmet:  Not Applicable  Comments: Service time is from 10:30AM to 12:05PM    Dr. Rush Farmer is Medical Director for Pulmonary Rehab at [facility].

## 2016-03-07 NOTE — Progress Notes (Signed)
Subjective:    Patient ID: Jerry Montgomery, male    DOB: 12-28-1955, 60 y.o.   MRN: [id number]  HPI  Recently saw his pulmonologist who recommended a low carbohydrate diet in order to achieve weight loss in order to help manage his pulmonary fibrosis. The patient states that he has successfully lost 9 pounds since starting this diet. However both he and the pulmonologists are concerned about hypoglycemia given the fact the patient is drastically reducing his carbohydrate consumption. His most recent lab work as listed below: Appointment on 03/06/2016  Component Date Value Ref Range Status  . WBC 03/06/2016 5.7  3.8 - 10.8 K/uL Final  . RBC 03/06/2016 4.79  4.20 - 5.80 MIL/uL Final  . Hemoglobin 03/06/2016 13.9  13.0 - 17.0 g/dL Final  . HCT 03/06/2016 42.9  38.5 - 50.0 % Final  . MCV 03/06/2016 89.6  80.0 - 100.0 fL Final  . MCH 03/06/2016 29.0  27.0 - 33.0 pg Final  . MCHC 03/06/2016 32.4  32.0 - 36.0 g/dL Final  . RDW 03/06/2016 14.2  11.0 - 15.0 % Final  . Platelets 03/06/2016 181  140 - 400 K/uL Final  . MPV 03/06/2016 9.1  7.5 - 12.5 fL Final  . Neutro Abs 03/06/2016 3477  1500 - 7800 cells/uL Final  . Lymphs Abs 03/06/2016 1425  850 - 3900 cells/uL Final  . Monocytes Absolute 03/06/2016 570  200 - 950 cells/uL Final  . Eosinophils Absolute 03/06/2016 228  15 - 500 cells/uL Final  . Basophils Absolute 03/06/2016 0  0 - 200 cells/uL Final  . Neutrophils Relative % 03/06/2016 61   Final  . Lymphocytes Relative 03/06/2016 25   Final  . Monocytes Relative 03/06/2016 10   Final  . Eosinophils Relative 03/06/2016 4   Final  . Basophils Relative 03/06/2016 0   Final  . Smear Review 03/06/2016 Criteria for review not met   Final   ** Please note change in unit of measure and reference range(s). **  . Sodium 03/06/2016 140  135 - 146 mmol/L Final  . Potassium 03/06/2016 4.4  3.5 - 5.3 mmol/L Final  . Chloride 03/06/2016 103  98 - 110 mmol/L Final  . CO2 03/06/2016 24  20 - 31 mmol/L  Final  . Glucose, Bld 03/06/2016 105* 70 - 99 mg/dL Final  . BUN 03/06/2016 18  7 - 25 mg/dL Final  . Creat 03/06/2016 1.29* 0.70 - 1.25 mg/dL Final  . Total Bilirubin 03/06/2016 0.7  0.2 - 1.2 mg/dL Final  . Alkaline Phosphatase 03/06/2016 53  40 - 115 U/L Final  . AST 03/06/2016 18  10 - 35 U/L Final  . ALT 03/06/2016 19  9 - 46 U/L Final  . Total Protein 03/06/2016 6.2  6.1 - 8.1 g/dL Final  . Albumin 03/06/2016 4.0  3.6 - 5.1 g/dL Final  . Calcium 03/06/2016 9.3  8.6 - 10.3 mg/dL Final  . Hgb A1c MFr Bld 03/06/2016 8.9* <5.7 % Final   Comment:   For someone without known diabetes, a hemoglobin A1c value of 6.5% or greater indicates that they may have diabetes and this should be confirmed with a follow-up test.   For someone with known diabetes, a value <7% indicates that their diabetes is well controlled and a value greater than or equal to 7% indicates suboptimal control. A1c targets should be individualized based on duration of diabetes, age, comorbid conditions, and other considerations.   Currently, no consensus exists for use of  hemoglobin A1c for diagnosis of diabetes for children.     . Mean Plasma Glucose 03/06/2016 209   Final  . Cholesterol 03/06/2016 92* 125 - 200 mg/dL Final  . Triglycerides 03/06/2016 105  <150 mg/dL Final  . HDL 03/06/2016 31* >=40 mg/dL Final  . Total CHOL/HDL Ratio 03/06/2016 3.0  <=5.0 Ratio Final  . VLDL 03/06/2016 21  <30 mg/dL Final  . LDL Cholesterol 03/06/2016 40  <130 mg/dL Final   Comment:   Total Cholesterol/HDL Ratio:CHD Risk                        Coronary Heart Disease Risk Table                                        Men       Women          1/2 Average Risk              3.4        3.3              Average Risk              5.0        4.4           2X Average Risk              9.6        7.1           3X Average Risk             23.4       11.0 Use the calculated Patient Ratio above and the CHD Risk table  to determine the  patient's CHD Risk.   Marland Kitchen TSH 03/06/2016 2.72  0.40 - 4.50 mIU/L Final   Hemoglobin A1c is significantly elevated 8.9. This suggests an average blood sugar greater than 200. Since starting the new diet, the patient states that his fasting blood sugars between 130 and 160. He states that his two-hour postprandial sugars are less than 200. However he also told me that prior to starting the new diet and his hemoglobin A1c, to direct this. He is currently on Lantus 60 units once daily and rapid acting insulin/NovoLog 10 units with breakfast, 10 units with lunch, 30 units with supper. He is also on metformin. Past Medical History  Diagnosis Date  . Hypertension   . Hyperlipidemia   . Postinflammatory pulmonary fibrosis ([place])   . CAD (coronary artery disease)   . Pneumothorax on right 4/11  . COPD (chronic obstructive pulmonary disease) ([place])   . Atrial tachycardia ([place])   . Acute diastolic heart failure ([place])   . On home oxygen therapy     "3L; 24/7" (04/15/2015)  . Sleep apnea     "won't use CPAP" (04/14/2105)  . Hypothyroidism   . Type II diabetes mellitus ([place])   . WPW (Wolff-Parkinson-White syndrome)     Per Dr. Tanna Furry Note  . SVT (supraventricular tachycardia) ([place])     Per Dr. Tanna Furry note  . Atrial fibrillation [facility])    Past Surgical History  Procedure Laterality Date  . Shoulder arthroscopy w/ rotator cuff repair Bilateral   . Bilateral vats ablation Right   . Cardiac catheterization  05/03/2007    patent stents  . Knee surgery  2012    "reattached  quad"  . Knee arthroscopy Right 01/22/2013    Procedure: RIGHT ARTHROSCOPY KNEE WITH DEBRIDEMENT, abrasion chondroplasty of lateral tibial plateau, debridement of partial tear of ACL, menisectomy;  Surgeon: Tobi Bastos, MD;  Location: [facility];  Service: Orthopedics;  Laterality: Right;  . Electrophysiologic study N/A 04/14/2015    Procedure: SVT Ablation;  Surgeon: Evans Lance, MD;  Location: [place] [facility];  Service:  Cardiovascular;  Laterality: N/A;  . Cardiac catheterization N/A 05/18/2015    Procedure: Right Heart Cath;  Surgeon: Belva Crome, MD;  Location: [place] [facility];  Service: Cardiovascular;  Laterality: N/A;  . Coronary angioplasty with stent placement  03/05/2003; 2008; 01/13/2010    PCI & Stent  LAD & mid CX; stent to CX  (I've got 4 stents total" (04/15/2015)  . Lung lobectomy Right   . Video bronchoscopy N/A 07/19/2015    Procedure: VIDEO BRONCHOSCOPY WITH FLUORO;  Surgeon: Melrose Nakayama, MD;  Location: [place];  Service: Thoracic;  Laterality: N/A;  . Video assisted thoracoscopy Left 07/19/2015    Procedure: LEFT VIDEO ASSISTED THORACOSCOPY WITH LEFT UPPER AND LOWER LUNG BIOPSY;  Surgeon: Melrose Nakayama, MD;  Location: [place];  Service: Thoracic;  Laterality: Left;   Current Outpatient Prescriptions on File Prior to Visit  Medication Sig Dispense Refill  . ADVAIR HFA 115-21 MCG/ACT inhaler Inhale 2 puffs into the lungs 2 (two) times daily. Reported on 02/25/2016    . ALPRAZolam (XANAX) 0.5 MG tablet Take 1 tablet (0.5 mg total) by mouth 3 (three) times daily as needed for anxiety. 90 tablet 0  . aspirin 81 MG tablet Take 81 mg by mouth daily.    Marland Kitchen atorvastatin (LIPITOR) 40 MG tablet Take 1 tablet by mouth  every night at bedtime 90 tablet 1  . blood glucose meter kit and supplies KIT Dispense based on patient and insurance preference. Use up to four times daily as directed. (FOR ICD E11.65) 1 each 0  . Blood Glucose Monitoring Suppl (ONE TOUCH ULTRA SYSTEM KIT) w/Device KIT Use to monitor FSBS 4x daily for fluctuating blood sugars. Dx: E11.65. 1 each 1  . clopidogrel (PLAVIX) 75 MG tablet Take 1 tablet by mouth  every day 90 tablet 3  . cyclobenzaprine (FLEXERIL) 10 MG tablet Take 1 tablet (10 mg total) by mouth 3 (three) times daily as needed for muscle spasms. 30 tablet 0  . diltiazem (CARDIZEM) 30 MG tablet Take 1 tablet (30 mg total) by mouth every 6 (six) hours. 360 tablet 1  .  furosemide (LASIX) 20 MG tablet Take 1 tablet (20 mg total) by mouth daily as needed for fluid (fluid). 90 tablet 3  . glucose blood test strip Use to monitor FSBS 4x daily for fluctuating blood sugars. Dx: E11.65. 400 each 4  . insulin aspart (NOVOLOG) 100 UNIT/ML injection 10 UNITS WITH BREAKFAST AND 10 UNITS WITH LUNCH, 30 UNITS WITH SUPPER 60 mL 4  . levothyroxine (SYNTHROID, LEVOTHROID) 125 MCG tablet Take 1 tablet (125 mcg total) by mouth daily. 90 tablet 3  . Menthol, Topical Analgesic, (BIOFREEZE EX) Apply 1 application topically as needed (for pain).    . metFORMIN (GLUCOPHAGE) 850 MG tablet Take 1 tablet by mouth  twice a day 180 tablet 3  . metoprolol (TOPROL XL) 200 MG 24 hr tablet Take 1 tablet (200 mg total) by mouth daily. 90 tablet 3  . nitroGLYCERIN (NITROSTAT) 0.4 MG SL tablet Place 1 tablet (0.4 mg total) under the tongue  every 5 (five) minutes as needed for chest pain. 25 tablet 1  . SYRINGE/NEEDLE, DISP, 1 ML (B-D SYRINGE/NEEDLE 1CC/25GX5/8) 25G X 5/8" 1 ML MISC As directed 100 each 5  . tiotropium (SPIRIVA) 18 MCG inhalation capsule Place 18 mcg into inhaler and inhale daily. Reported on 02/25/2016    . zolpidem (AMBIEN) 10 MG tablet Take 1 tablet (10 mg total) by mouth at bedtime as needed for sleep. 90 tablet 1   No current facility-administered medications on file prior to visit.   Allergies  Allergen Reactions  . Niaspan [Niacin] Itching  . Ofev [Nintedanib]   . Pirfenidone   . Altace [Ramipril] Other (See Comments)    Cough    Social History   Social History  . Marital Status: Single    Spouse Name: N/A  . Number of Children: 1  . Years of Education: N/A   Occupational History  .  [employer]   Social History Main Topics  . Smoking status: Former Smoker -- 1.50 packs/day for 35 years    Types: Cigarettes    Quit date: 10/24/2007  . Smokeless tobacco: Never Used  . Alcohol Use: 0.0 oz/week    0 Standard drinks or equivalent per week     Comment:  04/15/2015 "I've had a couple drinks in the last 8 months"  . Drug Use: No  . Sexual Activity: Not Currently   Other Topics Concern  . Not on file   Social History Narrative     Review of Systems  All other systems reviewed and are negative.      Objective:   Physical Exam  Constitutional: He appears well-developed and well-nourished.  Neck: No JVD present.  Cardiovascular: Normal rate, regular rhythm and normal heart sounds.   Pulmonary/Chest: He has wheezes.  Abdominal: Soft. Bowel sounds are normal. He exhibits no distension. There is no tenderness. There is no rebound.  Musculoskeletal: He exhibits edema.  Vitals reviewed.         Assessment & Plan:  Uncontrolled type 2 diabetes mellitus with hyperosmolarity without coma, with long-term current use of insulin ([place])  I am not concerned that the patient's: To experience hypoglycemia based on his A1c even with dramatic dietary changes. However I am concerned that his blood sugars are not well controlled. I will like to continue metformin at its current dose and continue Lantus at 60 units once daily since his fasting blood sugar is approximately 130. I will schedule back on his mealtime insulin giving his drastic reduction in carbon consumption of meals. He will discontinue the 10 units of NovoLog with breakfast and lunch. I will continue to 30 units with dinner at the present time as his evening blood sugars still approaching 200. If his two-hour postprandial sugars begin to drop, we will need to scale back on the 30 units with dinner. Given his history of ASCVD, I will add farxiga to assist in managing his blood sugar. He will take 10 mg a day. I believe that the mortality reduction seen with Vania Rea is likely a class effect and I have samples of farixga to help offset cost. I believe this will also help with his weight loss attempts. Furthermore this allows [place] to scale back on his insulin and will also help with weight loss.  Recheck fasting blood sugar and two-hour postprandial sugars in 2 weeks

## 2016-03-08 NOTE — Telephone Encounter (Signed)
pulm rehab closed on Wednesdays.  Will call back tomorrow to follow up on.

## 2016-03-09 ENCOUNTER — Encounter (HOSPITAL_COMMUNITY)
Admission: RE | Admit: 2016-03-09 | Discharge: 2016-03-09 | Disposition: A | Discharge status: Home / Self Care | Payer: PPO | Source: Ambulatory Visit | Attending: Internal Medicine | Admitting: Internal Medicine

## 2016-03-09 VITALS — Wt 249.6 lb

## 2016-03-09 DIAGNOSIS — J84112 Idiopathic pulmonary fibrosis: Secondary | ICD-10-CM | POA: Diagnosis not present

## 2016-03-09 DIAGNOSIS — J438 Other emphysema: Secondary | ICD-10-CM | POA: Diagnosis not present

## 2016-03-09 DIAGNOSIS — R269 Unspecified abnormalities of gait and mobility: Secondary | ICD-10-CM | POA: Diagnosis not present

## 2016-03-09 DIAGNOSIS — J8410 Pulmonary fibrosis, unspecified: Secondary | ICD-10-CM | POA: Diagnosis not present

## 2016-03-09 DIAGNOSIS — J449 Chronic obstructive pulmonary disease, unspecified: Secondary | ICD-10-CM | POA: Diagnosis not present

## 2016-03-09 NOTE — Progress Notes (Signed)
Pulmonary Individual Treatment Plan  Patient Details  Name: Jerry Montgomery MRN: [id number] Date of Birth: 06/01/1956 Referring Provider:    Initial Encounter Date:       Pulmonary Rehab Walk Test from 01/11/2016 in [place]   Date  01/11/16      Visit Diagnosis: IPF (idiopathic pulmonary fibrosis) ([place])  Patient's Home Medications on Admission:   Current outpatient prescriptions:  .  ADVAIR HFA 115-21 MCG/ACT inhaler, Inhale 2 puffs into the lungs 2 (two) times daily. Reported on 02/25/2016, Disp: , Rfl:  .  albuterol (PROVENTIL HFA;VENTOLIN HFA) 108 (90 Base) MCG/ACT inhaler, Inhale 2 puffs into the lungs every 6 (six) hours as needed for wheezing. (Patient not taking: Reported on 03/07/2016), Disp: 3 Inhaler, Rfl: 3 .  ALPRAZolam (XANAX) 0.5 MG tablet, Take 1 tablet (0.5 mg total) by mouth 3 (three) times daily as needed for anxiety., Disp: 90 tablet, Rfl: 0 .  aspirin 81 MG tablet, Take 81 mg by mouth daily., Disp: , Rfl:  .  atorvastatin (LIPITOR) 40 MG tablet, Take 1 tablet by mouth  every night at bedtime, Disp: 90 tablet, Rfl: 1 .  blood glucose meter kit and supplies KIT, Dispense based on patient and insurance preference. Use up to four times daily as directed. (FOR ICD E11.65), Disp: 1 each, Rfl: 0 .  Blood Glucose Monitoring Suppl (ONE TOUCH ULTRA SYSTEM KIT) w/Device KIT, Use to monitor FSBS 4x daily for fluctuating blood sugars. Dx: E11.65., Disp: 1 each, Rfl: 1 .  clopidogrel (PLAVIX) 75 MG tablet, Take 1 tablet by mouth  every day, Disp: 90 tablet, Rfl: 3 .  cyclobenzaprine (FLEXERIL) 10 MG tablet, Take 1 tablet (10 mg total) by mouth 3 (three) times daily as needed for muscle spasms., Disp: 30 tablet, Rfl: 0 .  diltiazem (CARDIZEM) 30 MG tablet, Take 1 tablet (30 mg total) by mouth every 6 (six) hours., Disp: 360 tablet, Rfl: 1 .  furosemide (LASIX) 20 MG tablet, Take 1 tablet (20 mg total) by mouth daily as needed for fluid (fluid)., Disp: 90  tablet, Rfl: 3 .  glucose blood test strip, Use to monitor FSBS 4x daily for fluctuating blood sugars. Dx: E11.65., Disp: 400 each, Rfl: 4 .  insulin aspart (NOVOLOG) 100 UNIT/ML injection, 10 UNITS WITH BREAKFAST AND 10 UNITS WITH LUNCH, 30 UNITS WITH SUPPER, Disp: 60 mL, Rfl: 4 .  insulin glargine (LANTUS) 100 UNIT/ML injection, Inject 0.6 mLs (60 Units total) into the skin at bedtime., Disp: 60 mL, Rfl: 3 .  isosorbide mononitrate (IMDUR) 60 MG 24 hr tablet, Take 60 mg by  mouth every day, Disp: 90 tablet, Rfl: 3 .  levothyroxine (SYNTHROID, LEVOTHROID) 125 MCG tablet, Take 1 tablet (125 mcg total) by mouth daily., Disp: 90 tablet, Rfl: 3 .  Menthol, Topical Analgesic, (BIOFREEZE EX), Apply 1 application topically as needed (for pain)., Disp: , Rfl:  .  metFORMIN (GLUCOPHAGE) 850 MG tablet, Take 1 tablet by mouth  twice a day, Disp: 180 tablet, Rfl: 3 .  metoprolol (TOPROL XL) 200 MG 24 hr tablet, Take 1 tablet (200 mg total) by mouth daily., Disp: 90 tablet, Rfl: 3 .  nitroGLYCERIN (NITROSTAT) 0.4 MG SL tablet, Place 1 tablet (0.4 mg total) under the tongue every 5 (five) minutes as needed for chest pain., Disp: 25 tablet, Rfl: 1 .  SYRINGE/NEEDLE, DISP, 1 ML (B-D SYRINGE/NEEDLE 1CC/25GX5/8) 25G X 5/8" 1 ML MISC, As directed, Disp: 100 each, Rfl: 5 .  tiotropium (  SPIRIVA) 18 MCG inhalation capsule, Place 18 mcg into inhaler and inhale daily. Reported on 02/25/2016, Disp: , Rfl:  .  zolpidem (AMBIEN) 10 MG tablet, Take 1 tablet (10 mg total) by mouth at bedtime as needed for sleep., Disp: 90 tablet, Rfl: 1 .  zolpidem (AMBIEN) 10 MG tablet, , Disp: , Rfl:   Past Medical History: Past Medical History  Diagnosis Date  . Hypertension   . Hyperlipidemia   . Postinflammatory pulmonary fibrosis ([place])   . CAD (coronary artery disease)   . Pneumothorax on right 4/11  . COPD (chronic obstructive pulmonary disease) ([place])   . Atrial tachycardia ([place])   . Acute diastolic heart failure ([place])   . On  home oxygen therapy     "3L; 24/7" (04/15/2015)  . Sleep apnea     "won't use CPAP" (04/14/2105)  . Hypothyroidism   . Type II diabetes mellitus ([place])   . WPW (Wolff-Parkinson-White syndrome)     Per Dr. Tanna Furry Note  . SVT (supraventricular tachycardia) ([place])     Per Dr. Tanna Furry note  . Atrial fibrillation ([facility])     Tobacco Use: History  Smoking status  . Former Smoker -- 1.50 packs/day for 35 years  . Types: Cigarettes  . Quit date: 10/24/2007  Smokeless tobacco  . Never Used    Labs: Recent Review Flowsheet Data    Labs for ITP Cardiac and Pulmonary Rehab Latest Ref Rng 07/06/2015 07/15/2015 07/20/2015 07/22/2015 03/06/2016   Cholestrol 125 - 200 mg/dL 131 - - - 92(L)   LDLCALC <130 mg/dL 62 - - - 40   HDL >=40 mg/dL 33(L) - - - 31(L)   Trlycerides <150 mg/dL 181(H) - - - 105   Hemoglobin A1c <5.7 % 8.6(H) - - - 8.9(H)   PHART 7.350 - 7.450 - 7.410 7.355 7.467(H) -   PCO2ART 35.0 - 45.0 mmHg - 38.0 46.0(H) 33.4(L) -   HCO3 20.0 - 24.0 mEq/L - 23.6 25.2(H) 23.9 -   TCO2 0 - 100 mmol/L - 24.8 26.6 24.9 -   ACIDBASEDEF 0.0 - 2.0 mmol/L - 0.4 - - -   O2SAT - - 97.2 93.1 95.6 -      Capillary Blood Glucose: Lab Results  Component Value Date   GLUCAP 182* 07/23/2015   GLUCAP 132* 07/23/2015   GLUCAP 138* 07/22/2015   GLUCAP 246* 07/22/2015   GLUCAP 185* 07/22/2015       POCT Glucose      01/13/16 1238 01/20/16 1256 01/25/16 1229 01/27/16 1238 02/01/16 1221   POCT Blood Glucose   Pre-Exercise 294 mg/dL 325 mg/dL 248 mg/dL 182 mg/dL 179 mg/dL   Post-Exercise 208 mg/dL  192 mg/dL 185 mg/dL 161 mg/dL     02/03/16 1349 02/08/16 1227 02/10/16 1348 02/15/16 1215 02/17/16 1226   POCT Blood Glucose   Pre-Exercise 208 mg/dL 235 mg/dL 157 mg/dL 256 mg/dL 157 mg/dL   Post-Exercise 188 mg/dL 212 mg/dL 262 mg/dL 150 mg/dL 84 mg/dL  [place]   Post-Exercise #2     106 mg/dL     02/22/16 1228 02/24/16 1234 02/29/16 1247 03/02/16 1405 03/07/16 1210   POCT Blood Glucose    Pre-Exercise 157 mg/dL 128 mg/dL  given a banana pre exercise 285 mg/dL 152 mg/dL 168 mg/dL   Post-Exercise 147 mg/dL 133 mg/dL 212 mg/dL 90 mg/dL 131 mg/dL     03/09/16 1228           POCT Blood Glucose   Pre-Exercise 136 mg/dL  Post-Exercise 131 mg/dL          ADL UCSD:     Pulmonary Assessment Scores      01/11/16 1412       ADL UCSD   SOB Score total 31        Pulmonary Function Assessment:     Pulmonary Function Assessment - 01/07/16 1111    Breath   Bilateral Breath Sounds Other   Other fine crackles in bases   Shortness of Breath Yes;Limiting activity      Exercise Target Goals:    Exercise Program Goal: Individual exercise prescription set with THRR, safety & activity barriers. Participant demonstrates ability to understand and report RPE using BORG scale, to self-measure pulse accurately, and to acknowledge the importance of the exercise prescription.  Exercise Prescription Goal: Starting with aerobic activity 30 plus minutes a day, 3 days per week for initial exercise prescription. Provide home exercise prescription and guidelines that participant acknowledges understanding prior to discharge.  Activity Barriers & Risk Stratification:     Activity Barriers & Cardiac Risk Stratification - 01/07/16 1110    Activity Barriers & Cardiac Risk Stratification   Activity Barriers Right Knee Replacement;Balance Concerns;Deconditioning;Joint Problems;Shortness of Breath      6 Minute Walk:     6 Minute Walk      01/11/16 1634       6 Minute Walk   Phase Initial     Distance 1072 feet     Walk Time 4.48 minutes     # of Rest Breaks 1     MPH 2.72     METS 2.87     RPE 11     Perceived Dyspnea  3     VO2 Peak 10.04     Symptoms No     Resting HR 77 bpm     Resting BP 116/76 mmHg     Max Ex. HR 120 bpm     Max Ex. BP 140/78 mmHg     2 Minute Post BP 142/82 mmHg     Interval HR   Baseline HR 77     1 Minute HR 91     2 Minute HR 120      3 Minute HR 105     4 Minute HR 105     5 Minute HR 101     6 Minute HR 100     2 Minute Post HR 90     Interval Heart Rate? Yes     Interval Oxygen   Interval Oxygen? Yes     Baseline Oxygen Saturation % 96 %     Baseline Liters of Oxygen 3 L  3 liters O2     1 Minute Oxygen Saturation % 91 %     1 Minute Liters of Oxygen 3 L     2 Minute Oxygen Saturation % 86 %     2 Minute Liters of Oxygen 3 L     3 Minute Oxygen Saturation % 84 %     3 Minute Liters of Oxygen 3 L     4 Minute Oxygen Saturation % 84 %     4 Minute Liters of Oxygen 3 L     5 Minute Oxygen Saturation % 89 %     5 Minute Liters of Oxygen 3 L     6 Minute Oxygen Saturation % 86 %     6 Minute Liters of Oxygen 3 L     2 Minute Post  Oxygen Saturation % 94 %     2 Minute Post Liters of Oxygen 3 L     Pre/Post BP   Baseline BP 116/76 mmHg     6 Minute BP 140/78 mmHg     Pre/Post BP? Yes        Initial Exercise Prescription:     Initial Exercise Prescription - 01/11/16 1600    Date of Initial Exercise RX and Referring Provider   Date 01/11/16   Oxygen   Oxygen Continuous   Liters 3   Bike   Level 1.2   Minutes 15   NuStep   Level 1   Minutes 15   METs 2.4   Track   Laps 8   Minutes 15   Prescription Details   Frequency (times per week) 2   Duration Progress to 45 minutes of aerobic exercise without signs/symptoms of physical distress   Intensity   THRR 40-80% of Max Heartrate 63-126   Ratings of Perceived Exertion 11-13   Perceived Dyspnea 0-4   Resistance Training   Training Prescription Yes   Weight orange bands   Reps 10-12      Perform Capillary Blood Glucose checks as needed.  Exercise Prescription Changes:     Exercise Prescription Changes      01/13/16 1200 01/20/16 1200 01/25/16 1200 01/27/16 1200 02/01/16 1200   Exercise Review   Progression No  Yes No Yes   Response to Exercise   Blood Pressure (Admit) 124/74 mmHg 110/64 mmHg 132/80 mmHg 110/60 mmHg 118/70 mmHg    Blood Pressure (Exercise) 158/62 mmHg  172/90 mmHg 120/60 mmHg 130/60 mmHg   Blood Pressure (Exit) 104/60 mmHg  92/88 mmHg 108/68 mmHg 112/66 mmHg   Heart Rate (Admit) 60 bpm 76 bpm 81 bpm 65 bpm 59 bpm   Heart Rate (Exercise) 90 bpm  110 bpm 75 bpm 82 bpm   Heart Rate (Exit) 73 bpm  92 bpm 61 bpm 58 bpm   Oxygen Saturation (Admit) 92 % 89 % 95 % 96 % 98 %   Oxygen Saturation (Exercise) 86 %  85 % 85 %  Desaturated to 85% on 6L walking on track, rest, PLB improve 88 %  Desaturated to 85% on 6L walking on track, rest, PLB improve   Oxygen Saturation (Exit) 96 %  96 % 96 % 96 %   Rating of Perceived Exertion (Exercise) _0 Perceived Dyspnea (Exercise) _1 Symptoms --  desaturated on exercise stations, o2 increased to 4 L  none --  Desaturated on walking track on 6 L, rest and PLB encouraged --  Desaturated on walking track on 6 L, rest and PLB encouraged   Comments --  increased to 4 L to prevent desaturations below 88  increased O2 to 5L on bike and 6L on track     Duration Progress to 45 minutes of aerobic exercise without signs/symptoms of physical distress  Progress to 45 minutes of aerobic exercise without signs/symptoms of physical distress Progress to 45 minutes of aerobic exercise without signs/symptoms of physical distress Progress to 45 minutes of aerobic exercise without signs/symptoms of physical distress   Intensity Other (comment)  THRR unchanged THRR unchanged THRR unchanged   Progression   Progression --  40-80% HRR  Continue to progress workloads to maintain intensity without signs/symptoms of physical distress. Continue to progress workloads to maintain intensity without signs/symptoms of physical distress. Continue to progress workloads to maintain  intensity without signs/symptoms of physical distress.   Resistance Training   Training Prescription Yes  Yes Yes Yes   Weight orange bands  orange bands orange bands blue bands   Reps 10-12  10-12 10-12 10-12    Interval Training   Interval Training No  No No No   Oxygen   Oxygen _0    Liters _1 Bike   Level 2.5  2.5 2.5 3.5   [place]   42     Minutes _2 NuStep   Level _3 Minutes _4 METs 2.2  2.5  2.9   Track   Laps   _5 Minutes   _6 02/03/16 1300 02/08/16 1200 02/10/16 1230 02/15/16 1200 02/17/16 1215   Exercise Review   Progression No       Response to Exercise   Blood Pressure (Admit) 110/70 mmHg 122/74 mmHg 98/60 mmHg 104/60 mmHg 100/66 mmHg   Blood Pressure (Exercise) 142/70 mmHg 150/76 mmHg 112/70 mmHg 132/66 mmHg 122/64 mmHg   Blood Pressure (Exit) 110/64 mmHg 110/60 mmHg 108/50 mmHg 92/60 mmHg  recheck BP 121/83 120/70 mmHg   Heart Rate (Admit) 59 bpm 59 bpm 70 bpm 65 bpm 56 bpm   Heart Rate (Exercise) 87 bpm 84 bpm 79 bpm 94 bpm 85 bpm   Heart Rate (Exit) 63 bpm 57 bpm 69 bpm 75 bpm 68 bpm   Oxygen Saturation (Admit) 96 % 97 % 98 % 98 % 99 %   Oxygen Saturation (Exercise) 89 % 89 % 83 %  pt forgot to turn O2 up to 8 liters with exercise 89 % 88 %   Oxygen Saturation (Exit) 92 % 99 % 97 % 94 % 98 %   Rating of Perceived Exertion (Exercise) _7 Perceived Dyspnea (Exercise) _8 Duration Progress to 45 minutes of aerobic exercise without signs/symptoms of physical distress Progress to 45 minutes of aerobic exercise without signs/symptoms of physical distress Progress to 45 minutes of aerobic exercise without signs/symptoms of physical distress Progress to 45 minutes of aerobic exercise without signs/symptoms of physical distress Progress to 45 minutes of aerobic exercise without signs/symptoms of physical distress   Intensity _9    Progression   Progression Continue to progress workloads to maintain intensity without signs/symptoms of physical distress. Continue to progress workloads to  maintain intensity without signs/symptoms of physical distress. Continue to progress workloads to maintain intensity without signs/symptoms of physical distress. Continue to progress workloads to maintain intensity without signs/symptoms of physical distress. Continue to progress workloads to maintain intensity without signs/symptoms of physical distress.   Resistance Training   Training Prescription _10    Weight _11    Reps 10-12 10-12 10-12 10-12 10-12   Interval Training   Interval Training _12    Oxygen   Oxygen _13    Liters _14 Bike   Level  3.5 3.5 3.5 3.5   Minutes  _15 NuStep   Level _16 Minutes _17 METs 2.9  2.6 2.3 4.5 3.9   Track   Laps _0 Minutes _1 02/22/16 1200 02/24/16 1200 02/29/16 1200 03/02/16 1400 03/07/16 1200   Exercise Review   Progression Yes    Yes   Response to Exercise   Blood Pressure (Admit) 132/70 mmHg 120/64 mmHg 104/60 mmHg 104/64 mmHg 100/62 mmHg   Blood Pressure (Exercise) 160/76 mmHg 140/80 mmHg 100/60 mmHg 122/70 mmHg 132/70 mmHg   Blood Pressure (Exit) 122/70 mmHg 100/60 mmHg 116/64 mmHg 126/72 mmHg 100/60 mmHg   Heart Rate (Admit) 54 bpm 63 bpm 63 bpm 60 bpm 55 bpm   Heart Rate (Exercise) 99 bpm 88 bpm 84 bpm 79 bpm 78 bpm   Heart Rate (Exit) 67 bpm 66 bpm 60 bpm 70 bpm 62 bpm   Oxygen Saturation (Admit) 98 % 97 % 99 % 96 % 99 %   Oxygen Saturation (Exercise) 82 % 88 % 89 % 90 % 91 %   Oxygen Saturation (Exit) 97 % 98 % 98 % 98 % 98 %   Rating of Perceived Exertion (Exercise) _2 Perceived Dyspnea (Exercise) _3 Comments   --  Discussed home exercise prescription with patient     Duration Progress to 45 minutes of aerobic exercise without signs/symptoms of physical distress Progress to 45 minutes of aerobic exercise without  signs/symptoms of physical distress Progress to 45 minutes of aerobic exercise without signs/symptoms of physical distress Progress to 45 minutes of aerobic exercise without signs/symptoms of physical distress Progress to 45 minutes of aerobic exercise without signs/symptoms of physical distress   Intensity _4    Progression   Progression Continue to progress workloads to maintain intensity without signs/symptoms of physical distress. Continue to progress workloads to maintain intensity without signs/symptoms of physical distress. Continue to progress workloads to maintain intensity without signs/symptoms of physical distress. Continue to progress workloads to maintain intensity without signs/symptoms of physical distress. Continue to progress workloads to maintain intensity without signs/symptoms of physical distress.   Resistance Training   Training Prescription _5    Weight _6    Reps 10-12 10-12 10-12 10-12 10-12   Interval Training   Interval Training No No  No No   Oxygen   Oxygen _7    Liters _8 Bike   Level _9 4.5   Minutes _10 NuStep   Level _11 Minutes _12 METs 3.6 2.9 3.3 4.3 2.9   Track   Laps _13 Minutes _14 Home Exercise Plan   Plans to continue exercise at   Home     Frequency   Add 3 additional days to program exercise sessions.       03/09/16 1200           Exercise Review   Progression Yes       Response to Exercise   Blood Pressure (Admit) 100/60 mmHg       Blood Pressure (Exercise) 120/64 mmHg       Blood Pressure (Exit) 106/70 mmHg       Heart Rate (  Admit) 56 bpm       Heart Rate (Exercise) 86 bpm       Heart Rate (Exit) 71 bpm       Oxygen Saturation (Admit) 99 %       Oxygen Saturation (Exercise) 90 %        Oxygen Saturation (Exit) 98 %       Rating of Perceived Exertion (Exercise) 13       Perceived Dyspnea (Exercise) 1       Duration Progress to 45 minutes of aerobic exercise without signs/symptoms of physical distress       Intensity THRR unchanged       Progression   Progression Continue to progress workloads to maintain intensity without signs/symptoms of physical distress.       Resistance Training   Training Prescription Yes       Weight blue bands       Reps 10-12       Interval Training   Interval Training No       Oxygen   Oxygen Continuous       Liters 8       Bike   Level 4.5       Minutes 15       NuStep   Level 6       Minutes 15       METs 3.4          Exercise Comments:     Exercise Comments      01/17/16 1420 02/08/16 1651 02/10/16 0839 03/09/16 0852     Exercise Comments Starting off well with exercise tolerance, will continue to follow exericse progression Starting off well with exercise tolerance, will continue to follow exericse progression Starting off well with exercise tolerance, will continue to follow exercise progression.  Patient now up to walking 12 laps. Will continue to monitor and progress as appropriate.       Discharge Exercise Prescription (Final Exercise Prescription Changes):     Exercise Prescription Changes - 03/09/16 1200    Exercise Review   Progression Yes   Response to Exercise   Blood Pressure (Admit) 100/60 mmHg   Blood Pressure (Exercise) 120/64 mmHg   Blood Pressure (Exit) 106/70 mmHg   Heart Rate (Admit) 56 bpm   Heart Rate (Exercise) 86 bpm   Heart Rate (Exit) 71 bpm   Oxygen Saturation (Admit) 99 %   Oxygen Saturation (Exercise) 90 %   Oxygen Saturation (Exit) 98 %   Rating of Perceived Exertion (Exercise) 13   Perceived Dyspnea (Exercise) 1   Duration Progress to 45 minutes of aerobic exercise without signs/symptoms of physical distress   Intensity THRR unchanged   Progression   Progression Continue to  progress workloads to maintain intensity without signs/symptoms of physical distress.   Resistance Training   Training Prescription Yes   Weight blue bands   Reps 10-12   Interval Training   Interval Training No   Oxygen   Oxygen Continuous   Liters 8   Bike   Level 4.5   Minutes 15   NuStep   Level 6   Minutes 15   METs 3.4       Nutrition:  Target Goals: Understanding of nutrition guidelines, daily intake of sodium <1565m, cholesterol <2050m calories 30% from fat and 7% or less from saturated fats, daily to have 5 or more servings of fruits and vegetables.  Biometrics:     Pre Biometrics - 01/07/16 1112  Pre Biometrics   Grip Strength 48 kg       Nutrition Therapy Plan and Nutrition Goals:     Nutrition Therapy & Goals - 01/31/16 0944    Nutrition Therapy   Diet Diabetic, Low Sodium   Personal Nutrition Goals   Personal Goal #1 Wt loss of 6-24 lb at graduation from Pulmonary Rehab   Personal Goal #2 Improve glycemic control as evidenced by an A1c < 7.0   Intervention Plan   Intervention Prescribe, educate and counsel regarding individualized specific dietary modifications aiming towards targeted core components such as weight, hypertension, lipid management, diabetes, heart failure and other comorbidities.   Expected Outcomes Short Term Goal: Understand basic principles of dietary content, such as calories, fat, sodium, cholesterol and nutrients.;Long Term Goal: Adherence to prescribed nutrition plan.      Nutrition Discharge: Rate Your Plate Scores:     Nutrition Assessments - 01/31/16 0943    Rate Your Plate Scores   Pre Score 45      Psychosocial: Target Goals: Acknowledge presence or absence of depression, maximize coping skills, provide positive support system. Participant is able to verbalize types and ability to use techniques and skills needed for reducing stress and depression.  Initial Review & Psychosocial Screening:     Initial Psych  Review & Screening - 01/07/16 1117    Initial Review   Current issues with Current Stress Concerns;Current Sleep Concerns   Source of Stress Concerns Unable to participate in former interests or hobbies;Unable to perform yard/household activities  ex wife medical condition   [place]? Yes   Barriers   Psychosocial barriers to participate in program Psychosocial barriers identified (see note);The patient should benefit from training in stress management and relaxation.   Screening Interventions   Interventions Encouraged to exercise      Quality of Life Scores:     Quality of Life - 01/11/16 1411    Quality of Life Scores   Health/Function Pre 16.77 %   Socioeconomic Pre 19.31 %   Psych/Spiritual Pre 21.5 %   Family Pre 20.13 %   GLOBAL Pre 18.65 %      PHQ-9:     Recent Review Flowsheet Data    Depression screen [facility] 2/9 01/07/2016   Decreased Interest 0   Down, Depressed, Hopeless 0   PHQ - 2 Score 0      Psychosocial Evaluation and Intervention:   Psychosocial Re-Evaluation:     Psychosocial Re-Evaluation      01/24/16 0719 01/24/16 0720 02/10/16 0808 03/07/16 1559     Psychosocial Re-Evaluation   Interventions  Encouraged to attend Pulmonary Rehabilitation for the exercise Encouraged to attend Pulmonary Rehabilitation for the exercise (p) Encouraged to attend Pulmonary Rehabilitation for the exercise    Comments patient has only attended 1 exercise session. Will continue to evaluate progress towards psycosocial goals       Continued Psychosocial Services Needed Yes         Education: Education Goals: Education classes will be provided on a weekly basis, covering required topics. Participant will state understanding/return demonstration of topics presented.  Learning Barriers/Preferences:     Learning Barriers/Preferences - 01/07/16 1111    Learning Barriers/Preferences   Learning Barriers None   Learning Preferences  Individual Instruction;Written Material;Group Instruction      Education Topics: Risk Factor Reduction:  -Group instruction that is supported by a PowerPoint presentation. Instructor discusses the definition of a risk factor, different risk factors for  pulmonary disease, and how the heart and lungs work together.     Nutrition for Pulmonary Patient:  -Group instruction provided by PowerPoint slides, verbal discussion, and written materials to support subject matter. The instructor gives an explanation and review of healthy diet recommendations, which includes a discussion on weight management, recommendations for fruit and vegetable consumption, as well as protein, fluid, caffeine, fiber, sodium, sugar, and alcohol. Tips for eating when patients are short of breath are discussed.   Pursed Lip Breathing:  -Group instruction that is supported by demonstration and informational handouts. Instructor discusses the benefits of pursed lip and diaphragmatic breathing and detailed demonstration on how to preform both.            PULMONARY REHAB OTHER RESPIRATORY from 03/09/2016 in [place]   Date  02/03/16   Educator  RT   Instruction Review Code  2- meets goals/outcomes      Oxygen Safety:  -Group instruction provided by PowerPoint, verbal discussion, and written material to support subject matter. There is an overview of "What is Oxygen" and "Why do we need it".  Instructor also reviews how to create a safe environment for oxygen use, the importance of using oxygen as prescribed, and the risks of noncompliance. There is a brief discussion on traveling with oxygen and resources the patient may utilize.   Oxygen Equipment:  -Group instruction provided by [facility] Staff utilizing handouts, written materials, and equipment demonstrations.      PULMONARY REHAB OTHER RESPIRATORY from 03/09/2016 in [place]   Date  02/10/16    Educator  [employer] rep   Instruction Review Code  2- meets goals/outcomes      Signs and Symptoms:  -Group instruction provided by written material and verbal discussion to support subject matter. Warning signs and symptoms of infection, stroke, and heart attack are reviewed and when to call the physician/911 reinforced. Tips for preventing the spread of infection discussed.   Advanced Directives:  -Group instruction provided by verbal instruction and written material to support subject matter. Instructor reviews Advanced Directive laws and proper instruction for filling out document.      PULMONARY REHAB OTHER RESPIRATORY from 03/09/2016 in [place]   Date  03/09/16   Educator  Jeanella Craze   Instruction Review Code  2- meets goals/outcomes      Pulmonary Video:  -Group video education that reviews the importance of medication and oxygen compliance, exercise, good nutrition, pulmonary hygiene, and pursed lip and diaphragmatic breathing for the pulmonary patient.   Exercise for the Pulmonary Patient:  -Group instruction that is supported by a PowerPoint presentation. Instructor discusses benefits of exercise, core components of exercise, frequency, duration, and intensity of an exercise routine, importance of utilizing pulse oximetry during exercise, safety while exercising, and options of places to exercise outside of rehab.        PULMONARY REHAB OTHER RESPIRATORY from 03/09/2016 in [place]   Date  02/24/16   Educator  ep   Instruction Review Code  2- meets goals/outcomes      Pulmonary Medications:  -Verbally interactive group education provided by instructor with focus on inhaled medications and proper administration.      PULMONARY REHAB OTHER RESPIRATORY from 03/09/2016 in [place]   Date  01/20/16   Educator  Pharm D   Instruction Review Code  2- meets goals/outcomes  Anatomy and Physiology of the Respiratory System and Intimacy:  -Group instruction provided by PowerPoint, verbal discussion, and written material to support subject matter. Instructor reviews respiratory cycle and anatomical components of the respiratory system and their functions. Instructor also reviews differences in obstructive and restrictive respiratory diseases with examples of each. Intimacy, Sex, and Sexuality differences are reviewed with a discussion on how relationships can change when diagnosed with pulmonary disease. Common sexual concerns are reviewed.      PULMONARY REHAB OTHER RESPIRATORY from 03/09/2016 in [place]   Date  03/02/16   Educator  rn   Instruction Review Code  2- meets goals/outcomes      Knowledge Questionnaire Score:     Knowledge Questionnaire Score - 01/11/16 1410    Knowledge Questionnaire Score   Pre Score 11/13      Core Components/Risk Factors/Patient Goals at Admission:     Personal Goals and Risk Factors at Admission - 01/07/16 1114    Core Components/Risk Factors/Patient Goals on Admission    Weight Management Obesity;Yes   Intervention Weight Management: Develop a combined nutrition and exercise program designed to reach desired caloric intake, while maintaining appropriate intake of nutrient and fiber, sodium and fats, and appropriate energy expenditure required for the weight goal.;Weight Management: Provide education and appropriate resources to help participant work on and attain dietary goals.;Obesity: Provide education and appropriate resources to help participant work on and attain dietary goals.;Weight Management/Obesity: Establish reasonable short term and long term weight goals.   Admit Weight 254 lb 6.6 oz (115.4 kg)   Expected Outcomes Short Term: Continue to assess and modify interventions until short term weight is achieved.;Long Term: Adherence to nutrition and physical activity/exercise  program aimed toward attainment of established weight goal.   Sedentary Yes   Intervention Provide advice, education, support and counseling about physical activity/exercise needs.;Develop an individualized exercise prescription for aerobic and resistive training based on initial evaluation findings, risk stratification, comorbidities and participant's personal goals.   Expected Outcomes Achievement of increased cardiorespiratory fitness and enhanced flexibility, muscular endurance and strength shown through measurements of functional capaciy and personal statement of participant.   Improve shortness of breath with ADL's Yes   Intervention Provide education, individualized exercise plan and daily activity instruction to help decrease symptoms of SOB with activities of daily living.   Expected Outcomes Short Term: Achieves a reduction of symptoms when performing activities of daily living.   Develop more efficient breathing techniques such as purse lipped breathing and diaphragmatic breathing; and practicing self-pacing with activity Yes   Intervention Provide education, demonstration and support about specific breathing techniuqes utilized for more efficient breathing. Include techniques such as pursed lipped breathing, diaphragmatic breathing and self-pacing activity.   Expected Outcomes Short Term: Participant will be able to demonstrate and use breathing techniques as needed throughout daily activities.   Increase knowledge of respiratory medications and ability to use respiratory devices properly  Yes   Intervention Provide education and demonstration as needed of appropriate use of medications, inhalers, and oxygen therapy.   Expected Outcomes Short Term: Achieves understanding of medications use. Understands that oxygen is a medication prescribed by physician. Demonstrates appropriate use of inhaler and oxygen therapy.   Diabetes Yes   Intervention Provide education about signs/symptoms and  action to take for hypo/hyperglycemia.;Provide education about proper nutrition, including hydration, and aerobic/resistive exercise prescription along with prescribed medications to achieve blood glucose in normal ranges: Fasting glucose 65-99 mg/dL   Expected Outcomes Short Term:  Participant verbalizes understanding of the signs/symptoms and immediate care of hyper/hypoglycemia, proper foot care and importance of medication, aerobic/resistive exercise and nutrition plan for blood glucose control.;Long Term: Attainment of HbA1C < 7%.   Hypertension Yes   Intervention Provide education on lifestyle modifcations including regular physical activity/exercise, weight management, moderate sodium restriction and increased consumption of fresh fruit, vegetables, and low fat dairy, alcohol moderation, and smoking cessation.;Monitor prescription use compliance.   Expected Outcomes Short Term: Continued assessment and intervention until BP is < 140/21m HG in hypertensive participants. < 130/873mHG in hypertensive participants with diabetes, heart failure or chronic kidney disease.;Long Term: Maintenance of blood pressure at goal levels.   Personal Goal Other Yes   Personal Goal possibly be able to golf and bowl again   Intervention build stamina and stregnth thru increasing workloads on exercise equiptment   Expected Outcomes patient will feel that he has enough stamina and stregnth to participate in the above activities even if activity has time modifications      Core Components/Risk Factors/Patient Goals Review:      Goals and Risk Factor Review      01/24/16 0720 02/10/16 0807 03/07/16 1556       Core Components/Risk Factors/Patient Goals Review   Personal Goals Review  Weight Management/Obesity;Increase Strength and Stamina;Develop more efficient breathing techniques such as purse lipped breathing and diaphragmatic breathing and practicing self-pacing with activity.;Improve shortness of breath with  ADL's;Sedentary Weight Management/Obesity;Increase Strength and Stamina;Develop more efficient breathing techniques such as purse lipped breathing and diaphragmatic breathing and practicing self-pacing with activity.;Improve shortness of breath with ADL's;Sedentary     Review Patient has only attended 1 exercise session. Will continue to evaluate progression of personal goals. see "comments" section on ITP see "comments" section on ITP     Expected Outcomes see expected outcomes on intial review see Admission expected outcomes see Admission expected outcomes        Core Components/Risk Factors/Patient Goals at Discharge (Final Review):      Goals and Risk Factor Review - 03/07/16 1556    Core Components/Risk Factors/Patient Goals Review   Personal Goals Review Weight Management/Obesity;Increase Strength and Stamina;Develop more efficient breathing techniques such as purse lipped breathing and diaphragmatic breathing and practicing self-pacing with activity.;Improve shortness of breath with ADL's;Sedentary   Review see "comments" section on ITP   Expected Outcomes see Admission expected outcomes      ITP Comments:   Comments: ITP REVIEW Pt is making expected progress toward personal goals after completing 16 sessions. Recommend continued exercise, life style modification, education, and utilization of breathing techniques to increase stamina and strength and decrease shortness of breath with exertion.

## 2016-03-09 NOTE — Telephone Encounter (Signed)
Spoke with Jerry NonesPortia, pt uses [employer] for O2 States that Jerry Montgomery/[facility] is at [facility] right now and they were needing an order home O2 to be set up in addition to his [employer]. Jerry Nonesortia states that she put in the order herself and has taken care of everything O2 wise - order shows up under "Other Orders" in EPIC.  Will send to MR as FYI.

## 2016-03-09 NOTE — Progress Notes (Signed)
Daily Session Note  Patient Details  Name: Jerry Montgomery MRN: [id number] Date of Birth: 1956/08/04 Referring Provider:    Encounter Date: 03/09/2016  Check In:     Session Check In - 03/09/16 1013    Check-In   Location [facility]   Staff Present Trish Fountain, RN, BSN;Lisa Ysidro Evert, RN;Joan Leonia Reeves, RN, BSN;Maurine Mowbray, MS, ACSM RCEP, Exercise Physiologist   Supervising physician immediately available to respond to emergencies [facility] immediately available   Physician(s) Dr. Waldron Labs   Medication changes reported     No   Fall or balance concerns reported    No   Warm-up and Cool-down Performed as group-led instruction   Resistance Training Performed Yes   VAD Patient? No   Pain Assessment   Currently in Pain? No/denies   Multiple Pain Sites No      Capillary Blood Glucose: No results found for this or any previous visit (from the past 24 hour(s)).     POCT Glucose - 03/09/16 1228    POCT Blood Glucose   Pre-Exercise 136 mg/dL   Post-Exercise 131 mg/dL         Exercise Prescription Changes - 03/09/16 1200    Exercise Review   Progression Yes   Response to Exercise   Blood Pressure (Admit) 100/60 mmHg   Blood Pressure (Exercise) 120/64 mmHg   Blood Pressure (Exit) 106/70 mmHg   Heart Rate (Admit) 56 bpm   Heart Rate (Exercise) 86 bpm   Heart Rate (Exit) 71 bpm   Oxygen Saturation (Admit) 99 %   Oxygen Saturation (Exercise) 90 %   Oxygen Saturation (Exit) 98 %   Rating of Perceived Exertion (Exercise) 13   Perceived Dyspnea (Exercise) 1   Duration Progress to 45 minutes of aerobic exercise without signs/symptoms of physical distress   Intensity THRR unchanged   Progression   Progression Continue to progress workloads to maintain intensity without signs/symptoms of physical distress.   Resistance Training   Training Prescription Yes   Weight blue bands   Reps 10-12   Interval Training   Interval Training No   Oxygen   Oxygen Continuous   Liters 8   Bike   Level 4.5   Minutes 15   NuStep   Level 6   Minutes 15   METs 3.4     Goals Met:  Exercise tolerated well No report of cardiac concerns or symptoms Strength training completed today  Goals Unmet:  Not Applicable  Comments: Service time is from 10:30AM to 12:20PM    Dr. Rush Farmer is Medical Director for Pulmonary Rehab at [facility].

## 2016-03-09 NOTE — Progress Notes (Signed)
SATURATION QUALIFICATIONS: (This note is used to comply with regulatory documentation for home oxygen)  Patient Saturations on Room Air at Rest = 88%    Please briefly explain why patient needs home oxygen: Pulmonary fibrosis

## 2016-03-11 DIAGNOSIS — J9611 Chronic respiratory failure with hypoxia: Secondary | ICD-10-CM | POA: Diagnosis not present

## 2016-03-11 DIAGNOSIS — J849 Interstitial pulmonary disease, unspecified: Secondary | ICD-10-CM | POA: Diagnosis not present

## 2016-03-14 ENCOUNTER — Encounter (HOSPITAL_COMMUNITY)
Admission: RE | Admit: 2016-03-14 | Discharge: 2016-03-14 | Disposition: A | Discharge status: Home / Self Care | Payer: PPO | Source: Ambulatory Visit | Attending: Internal Medicine | Admitting: Internal Medicine

## 2016-03-14 VITALS — Wt 248.2 lb

## 2016-03-14 DIAGNOSIS — J84112 Idiopathic pulmonary fibrosis: Secondary | ICD-10-CM | POA: Diagnosis not present

## 2016-03-14 NOTE — Progress Notes (Signed)
Daily Session Note  Patient Details  Name: Jerry Montgomery MRN: [id number] Date of Birth: 08-22-1956 Referring Provider:    Encounter Date: 03/14/2016  Check In:     Session Check In - 03/14/16 1214    Check-In   Location [facility]   Staff Present Su Hilt, MS, ACSM RCEP, Exercise Physiologist;Lisa Ysidro Evert, RN;Osiah Haring Rollene Rotunda, RN, BSN   Supervising physician immediately available to respond to emergencies [facility] immediately available   Physician(s) Dr Marily Memos   Medication changes reported     No   Fall or balance concerns reported    No   Warm-up and Cool-down Performed as group-led Location manager Performed Yes   VAD Patient? No   Pain Assessment   Currently in Pain? No/denies   Multiple Pain Sites No      Capillary Blood Glucose: No results found for this or any previous visit (from the past 24 hour(s)).      Exercise Prescription Changes - 03/14/16 1215    Response to Exercise   Blood Pressure (Admit) 98/64 mmHg   Blood Pressure (Exercise) 150/78 mmHg   Blood Pressure (Exit) 106/61 mmHg   Heart Rate (Admit) 57 bpm   Heart Rate (Exercise) 84 bpm   Heart Rate (Exit) 67 bpm   Oxygen Saturation (Admit) 97 %   Oxygen Saturation (Exercise) 88 %   Oxygen Saturation (Exit) 97 %   Rating of Perceived Exertion (Exercise) 13   Perceived Dyspnea (Exercise) 1   Duration Progress to 45 minutes of aerobic exercise without signs/symptoms of physical distress   Intensity THRR unchanged   Progression   Progression Continue to progress workloads to maintain intensity without signs/symptoms of physical distress.   Resistance Training   Training Prescription Yes   Weight blue bands   Reps 10-12   Interval Training   Interval Training No   Oxygen   Oxygen Continuous   Liters 8   Bike   Level 4.5   Minutes 15   NuStep   Level 6   Minutes 15   METs 3   Track   Laps 15   Minutes 15     Goals Met:  Independence with  exercise equipment Improved SOB with ADL's Using PLB without cueing & demonstrates good technique Achieving weight loss Exercise tolerated well No report of cardiac concerns or symptoms Strength training completed today  Goals Unmet:  Not Applicable  Comments: Service time is from 1030 to 1210   Dr. Rush Farmer is Medical Director for Pulmonary Rehab at [facility].

## 2016-03-16 ENCOUNTER — Encounter (HOSPITAL_COMMUNITY)
Admission: RE | Admit: 2016-03-16 | Discharge: 2016-03-16 | Disposition: A | Discharge status: Home / Self Care | Payer: PPO | Source: Ambulatory Visit | Attending: Internal Medicine | Admitting: Internal Medicine

## 2016-03-16 VITALS — Wt 249.6 lb

## 2016-03-16 DIAGNOSIS — J84112 Idiopathic pulmonary fibrosis: Secondary | ICD-10-CM

## 2016-03-16 NOTE — Telephone Encounter (Signed)
I believe this has been addressed

## 2016-03-16 NOTE — Progress Notes (Signed)
Daily Session Note  Patient Details  Name: Jerry Montgomery MRN: [id number] Date of Birth: 1955/10/26 Referring Provider:    Encounter Date: 03/16/2016  Check In:     Session Check In - 03/16/16 1013    Check-In   Location [facility]   Staff Present Su Hilt, MS, ACSM RCEP, Exercise Physiologist;Portia Rollene Rotunda, RN, BSN;Lisa [place], RN;Joan [employer], RN, BSN   Supervising physician immediately available to respond to emergencies [facility] immediately available   Physician(s) Dr. Eliseo Squires   Medication changes reported     No   Fall or balance concerns reported    No   Warm-up and Cool-down Performed as group-led instruction   Resistance Training Performed Yes   VAD Patient? No   Pain Assessment   Currently in Pain? No/denies   Multiple Pain Sites No      Capillary Blood Glucose: No results found for this or any previous visit (from the past 24 hour(s)).      Exercise Prescription Changes - 03/16/16 1200    Response to Exercise   Blood Pressure (Admit) 92/52 mmHg   Blood Pressure (Exercise) 118/68 mmHg   Blood Pressure (Exit) 98/60 mmHg   Heart Rate (Admit) 51 bpm   Heart Rate (Exercise) 75 bpm   Heart Rate (Exit) 57 bpm   Oxygen Saturation (Admit) 98 %   Oxygen Saturation (Exercise) 88 %   Oxygen Saturation (Exit) 98 %   Rating of Perceived Exertion (Exercise) 11   Perceived Dyspnea (Exercise) 1   Duration Progress to 45 minutes of aerobic exercise without signs/symptoms of physical distress   Intensity THRR unchanged   Progression   Progression Continue to progress workloads to maintain intensity without signs/symptoms of physical distress.   Resistance Training   Training Prescription Yes   Weight blue bands   Reps 10-12   Interval Training   Interval Training No   Oxygen   Oxygen Continuous   Liters 8   Bike   Level 4.5   Minutes 15   Track   Laps 10   Minutes 15     Goals Met:  Exercise tolerated well No report of cardiac  concerns or symptoms Strength training completed today  Goals Unmet:  Not Applicable  Comments: Service time is from 10:30AM to 12:00PM    Dr. Rush Farmer is Medical Director for Pulmonary Rehab at [facility].

## 2016-03-21 ENCOUNTER — Encounter (HOSPITAL_COMMUNITY): Payer: PPO

## 2016-03-21 ENCOUNTER — Ambulatory Visit: Payer: PPO | Admitting: Family Medicine

## 2016-03-23 ENCOUNTER — Encounter (HOSPITAL_COMMUNITY): Payer: PPO

## 2016-03-24 ENCOUNTER — Ambulatory Visit (INDEPENDENT_AMBULATORY_CARE_PROVIDER_SITE_OTHER): Payer: PPO | Admitting: Family Medicine

## 2016-03-24 ENCOUNTER — Encounter: Payer: Self-pay | Admitting: Family Medicine

## 2016-03-24 VITALS — BP 112/74 | HR 60 | Temp 97.4°F | Resp 18 | Wt 248.0 lb

## 2016-03-24 DIAGNOSIS — Z794 Long term (current) use of insulin: Secondary | ICD-10-CM | POA: Diagnosis not present

## 2016-03-24 DIAGNOSIS — E1100 Type 2 diabetes mellitus with hyperosmolarity without nonketotic hyperglycemic-hyperosmolar coma (NKHHC): Secondary | ICD-10-CM | POA: Diagnosis not present

## 2016-03-24 NOTE — Progress Notes (Signed)
Subjective:    Patient ID: Jerry Montgomery, male    DOB: 16-Sep-1956, 60 y.o.   MRN: [id number]  HPI  03/07/16 Recently saw his pulmonologist who recommended a low carbohydrate diet in order to achieve weight loss in order to help manage his pulmonary fibrosis. The patient states that he has successfully lost 9 pounds since starting this diet. However both he and the pulmonologists are concerned about hypoglycemia given the fact the patient is drastically reducing his carbohydrate consumption. Recently, his Hemoglobin A1c was significantly elevated 8.9. This suggests an average blood sugar greater than 200. Since starting the new diet, the patient states that his fasting blood sugars between 130 and 160. He states that his two-hour postprandial sugars are less than 200. However he also told me that prior to starting the new diet.  He is currently on Lantus 60 units once daily and rapid acting insulin/NovoLog 10 units with breakfast, 10 units with lunch, 30 units with supper. He is also on metformin.  At that time, my plan was: I doubt that the patient will experience hypoglycemia based on his A1c even with dramatic dietary changes. However I am concerned that his blood sugars are not well controlled. I would like to continue metformin at its current dose and continue Lantus at 60 units once daily since his fasting blood sugar is approximately 130. I will cut back on his mealtime insulin giving his drastic reduction in carbon consumption of meals. He will discontinue the 10 units of NovoLog with breakfast and lunch. I will continue 30 units with dinner at the present time as his evening blood sugars still approaching 200. If his two-hour postprandial sugars begin to drop, we will need to scale back on the 30 units with dinner. Given his history of ASCVD, I will add farxiga to assist in managing his blood sugar. He will take 10 mg a day. I believe that the mortality reduction seen with Vania Rea is likely a  class effect and I have samples of farixga to help offset cost. I believe this will also help with his weight loss attempts. Furthermore this allows [place] to scale back on his insulin which may also help with weight loss. Recheck fasting blood sugar and two-hour postprandial sugars in 2 weeks.  03/24/16 Wt Readings from Last 3 Encounters:  03/24/16 248 lb (112.492 kg)  03/16/16 249 lb 9 oz (113.2 kg)  03/14/16 248 lb 3.8 oz (112.6 kg)  Fast and blood sugars have dropped precipitously. His fasting blood sugars range between 64 and 90 in the morning. The vast majority are in the 5s. His evening blood sugars range between 101 and 228 but the vast majority are in the 170s.    Past Medical History  Diagnosis Date  . Hypertension   . Hyperlipidemia   . Postinflammatory pulmonary fibrosis ([place])   . CAD (coronary artery disease)   . Pneumothorax on right 4/11  . COPD (chronic obstructive pulmonary disease) ([place])   . Atrial tachycardia ([place])   . Acute diastolic heart failure ([place])   . On home oxygen therapy     "3L; 24/7" (04/15/2015)  . Sleep apnea     "won't use CPAP" (04/14/2105)  . Hypothyroidism   . Type II diabetes mellitus ([place])   . WPW (Wolff-Parkinson-White syndrome)     Per Dr. Tanna Furry Note  . SVT (supraventricular tachycardia) ([place])     Per Dr. Tanna Furry note  . Atrial fibrillation [facility])    Past Surgical History  Procedure Laterality Date  . Shoulder arthroscopy w/ rotator cuff repair Bilateral   . Bilateral vats ablation Right   . Cardiac catheterization  05/03/2007    patent stents  . Knee surgery  2012    "reattached quad"  . Knee arthroscopy Right 01/22/2013    Procedure: RIGHT ARTHROSCOPY KNEE WITH DEBRIDEMENT, abrasion chondroplasty of lateral tibial plateau, debridement of partial tear of ACL, menisectomy;  Surgeon: Tobi Bastos, MD;  Location: [facility];  Service: Orthopedics;  Laterality: Right;  . Electrophysiologic study N/A 04/14/2015    Procedure: SVT Ablation;   Surgeon: Evans Lance, MD;  Location: [place] [facility];  Service: Cardiovascular;  Laterality: N/A;  . Cardiac catheterization N/A 05/18/2015    Procedure: Right Heart Cath;  Surgeon: Belva Crome, MD;  Location: [place] [facility];  Service: Cardiovascular;  Laterality: N/A;  . Coronary angioplasty with stent placement  03/05/2003; 2008; 01/13/2010    PCI & Stent  LAD & mid CX; stent to CX  (I've got 4 stents total" (04/15/2015)  . Lung lobectomy Right   . Video bronchoscopy N/A 07/19/2015    Procedure: VIDEO BRONCHOSCOPY WITH FLUORO;  Surgeon: Melrose Nakayama, MD;  Location: [place];  Service: Thoracic;  Laterality: N/A;  . Video assisted thoracoscopy Left 07/19/2015    Procedure: LEFT VIDEO ASSISTED THORACOSCOPY WITH LEFT UPPER AND LOWER LUNG BIOPSY;  Surgeon: Melrose Nakayama, MD;  Location: [place];  Service: Thoracic;  Laterality: Left;   Current Outpatient Prescriptions on File Prior to Visit  Medication Sig Dispense Refill  . ADVAIR HFA 115-21 MCG/ACT inhaler Inhale 2 puffs into the lungs 2 (two) times daily. Reported on 02/25/2016    . albuterol (PROVENTIL HFA;VENTOLIN HFA) 108 (90 Base) MCG/ACT inhaler Inhale 2 puffs into the lungs every 6 (six) hours as needed for wheezing. (Patient not taking: Reported on 03/07/2016) 3 Inhaler 3  . ALPRAZolam (XANAX) 0.5 MG tablet Take 1 tablet (0.5 mg total) by mouth 3 (three) times daily as needed for anxiety. 90 tablet 0  . aspirin 81 MG tablet Take 81 mg by mouth daily.    Marland Kitchen atorvastatin (LIPITOR) 40 MG tablet Take 1 tablet by mouth  every night at bedtime 90 tablet 1  . blood glucose meter kit and supplies KIT Dispense based on patient and insurance preference. Use up to four times daily as directed. (FOR ICD E11.65) 1 each 0  . Blood Glucose Monitoring Suppl (ONE TOUCH ULTRA SYSTEM KIT) w/Device KIT Use to monitor FSBS 4x daily for fluctuating blood sugars. Dx: E11.65. 1 each 1  . clopidogrel (PLAVIX) 75 MG tablet Take 1 tablet by mouth  every  day 90 tablet 3  . cyclobenzaprine (FLEXERIL) 10 MG tablet Take 1 tablet (10 mg total) by mouth 3 (three) times daily as needed for muscle spasms. 30 tablet 0  . diltiazem (CARDIZEM) 30 MG tablet Take 1 tablet (30 mg total) by mouth every 6 (six) hours. 360 tablet 1  . furosemide (LASIX) 20 MG tablet Take 1 tablet (20 mg total) by mouth daily as needed for fluid (fluid). 90 tablet 3  . glucose blood test strip Use to monitor FSBS 4x daily for fluctuating blood sugars. Dx: E11.65. 400 each 4  . insulin aspart (NOVOLOG) 100 UNIT/ML injection 10 UNITS WITH BREAKFAST AND 10 UNITS WITH LUNCH, 30 UNITS WITH SUPPER 60 mL 4  . insulin glargine (LANTUS) 100 UNIT/ML injection Inject 0.6 mLs (60 Units total) into the skin at bedtime. [place]  mL 3  . isosorbide mononitrate (IMDUR) 60 MG 24 hr tablet Take 60 mg by  mouth every day 90 tablet 3  . levothyroxine (SYNTHROID, LEVOTHROID) 125 MCG tablet Take 1 tablet (125 mcg total) by mouth daily. 90 tablet 3  . Menthol, Topical Analgesic, (BIOFREEZE EX) Apply 1 application topically as needed (for pain).    . metFORMIN (GLUCOPHAGE) 850 MG tablet Take 1 tablet by mouth  twice a day 180 tablet 3  . metoprolol (TOPROL XL) 200 MG 24 hr tablet Take 1 tablet (200 mg total) by mouth daily. 90 tablet 3  . nitroGLYCERIN (NITROSTAT) 0.4 MG SL tablet Place 1 tablet (0.4 mg total) under the tongue every 5 (five) minutes as needed for chest pain. 25 tablet 1  . SYRINGE/NEEDLE, DISP, 1 ML (B-D SYRINGE/NEEDLE 1CC/25GX5/8) 25G X 5/8" 1 ML MISC As directed 100 each 5  . tiotropium (SPIRIVA) 18 MCG inhalation capsule Place 18 mcg into inhaler and inhale daily. Reported on 02/25/2016    . zolpidem (AMBIEN) 10 MG tablet Take 1 tablet (10 mg total) by mouth at bedtime as needed for sleep. 90 tablet 1  . zolpidem (AMBIEN) 10 MG tablet      No current facility-administered medications on file prior to visit.   Allergies  Allergen Reactions  . Niaspan [Niacin] Itching  . Ofev [Nintedanib]     . Pirfenidone   . Altace [Ramipril] Other (See Comments)    Cough    Social History   Social History  . Marital Status: Single    Spouse Name: N/A  . Number of Children: 1  . Years of Education: N/A   Occupational History  .  [employer]   Social History Main Topics  . Smoking status: Former Smoker -- 1.50 packs/day for 35 years    Types: Cigarettes    Quit date: 10/24/2007  . Smokeless tobacco: Never Used  . Alcohol Use: 0.0 oz/week    0 Standard drinks or equivalent per week     Comment: 04/15/2015 "I've had a couple drinks in the last 8 months"  . Drug Use: No  . Sexual Activity: Not Currently   Other Topics Concern  . Not on file   Social History Narrative     Review of Systems  All other systems reviewed and are negative.      Objective:   Physical Exam  Constitutional: He appears well-developed and well-nourished.  Neck: No JVD present.  Cardiovascular: Normal rate, regular rhythm and normal heart sounds.   Pulmonary/Chest: He has wheezes.  Abdominal: Soft. Bowel sounds are normal. He exhibits no distension. There is no tenderness. There is no rebound.  Musculoskeletal: He exhibits edema.  Vitals reviewed.         Assessment & Plan:  Diabetes Mellitus II- Because of the hypoglycemia he is seen in the morning, I will back down on his basal Lantus dose from 60-50 units. With his diet and exercise I anticipate that the postprandial sugars should start to fall below 160. Overall and very impressed with the drop in his blood sugars. Continue Farxiga 10 mg poqday.

## 2016-03-28 ENCOUNTER — Encounter (HOSPITAL_COMMUNITY)
Admission: RE | Admit: 2016-03-28 | Discharge: 2016-03-28 | Disposition: A | Discharge status: Home / Self Care | Payer: PPO | Source: Ambulatory Visit | Attending: Internal Medicine | Admitting: Internal Medicine

## 2016-03-28 VITALS — Wt 248.0 lb

## 2016-03-28 DIAGNOSIS — J84112 Idiopathic pulmonary fibrosis: Secondary | ICD-10-CM | POA: Insufficient documentation

## 2016-03-28 NOTE — Progress Notes (Signed)
Daily Session Note  Patient Details  Name: Jerry Montgomery MRN: [id number] Date of Birth: 09-08-1956 Referring Provider:    Encounter Date: 03/28/2016  Check In:     Session Check In - 03/28/16 1008    Check-In   Location [facility]   Staff Present Trish Fountain, RN, BSN;Lisa Ysidro Evert, RN;Leniyah Martell, MS, ACSM RCEP, Exercise Physiologist;Joan Leonia Reeves, Therapist, sports, BSN;Ramon Dredge, RN, [facility]   Supervising physician immediately available to respond to emergencies [facility] immediately available   Physician(s) Dr. Marily Memos   Medication changes reported     No   Fall or balance concerns reported    No   Warm-up and Cool-down Performed as group-led instruction   Resistance Training Performed Yes   VAD Patient? No   Pain Assessment   Currently in Pain? No/denies   Multiple Pain Sites No      Capillary Blood Glucose: No results found for this or any previous visit (from the past 24 hour(s)).      Exercise Prescription Changes - 03/28/16 1200    Response to Exercise   Blood Pressure (Admit) 96/56 mmHg   Blood Pressure (Exercise) 140/82 mmHg   Blood Pressure (Exit) 103/69 mmHg   Heart Rate (Admit) 53 bpm   Heart Rate (Exercise) 83 bpm   Heart Rate (Exit) 66 bpm   Oxygen Saturation (Admit) 99 %   Oxygen Saturation (Exercise) 88 %   Oxygen Saturation (Exit) 97 %   Rating of Perceived Exertion (Exercise) 13   Perceived Dyspnea (Exercise) 2   Duration Progress to 45 minutes of aerobic exercise without signs/symptoms of physical distress   Intensity THRR unchanged   Progression   Progression Continue to progress workloads to maintain intensity without signs/symptoms of physical distress.   Resistance Training   Training Prescription Yes   Weight blue bands   Reps 10-12   Interval Training   Interval Training No   Oxygen   Oxygen Continuous   Liters 8   Bike   Level 4.5   Minutes 15   NuStep   Level 6   Minutes 15   METs 3.1   Track   Laps 15    Minutes 15     Goals Met:  Exercise tolerated well No report of cardiac concerns or symptoms Strength training completed today  Goals Unmet:  Not Applicable  Comments: Service time is from 10:30AM to 12:00PM    Dr. Rush Farmer is Medical Director for Pulmonary Rehab at [facility].

## 2016-03-30 ENCOUNTER — Encounter (HOSPITAL_COMMUNITY)
Admission: RE | Admit: 2016-03-30 | Discharge: 2016-03-30 | Disposition: A | Discharge status: Home / Self Care | Payer: PPO | Source: Ambulatory Visit | Attending: Internal Medicine | Admitting: Internal Medicine

## 2016-03-30 VITALS — Wt 248.2 lb

## 2016-03-30 DIAGNOSIS — J84112 Idiopathic pulmonary fibrosis: Secondary | ICD-10-CM

## 2016-03-30 LAB — GLUCOSE, CAPILLARY
GLUCOSE-CAPILLARY: 210 mg/dL — AB (ref 65–99)
GLUCOSE-CAPILLARY: 150 mg/dL — AB (ref 65–99)

## 2016-03-30 NOTE — Progress Notes (Signed)
Daily Session Note  Patient Details  Name: Jerry Montgomery MRN: [id number] Date of Birth: [birth year] Referring Provider:    Encounter Date: 03/30/2016  Check In:     Session Check In - 03/30/16 1048    Check-In   Location [facility]   Staff Present Rosebud Poles, RN, BSN;Ewel Lona, MS, ACSM RCEP, Exercise Physiologist;Lisa Ysidro Evert, Felipe Drone, RN, MHA;Portia Rollene Rotunda, RN, BSN   Supervising physician immediately available to respond to emergencies [facility] immediately available   Physician(s) Dr. Marily Memos   Medication changes reported     No   Fall or balance concerns reported    No   Warm-up and Cool-down Performed as group-led instruction   Resistance Training Performed Yes   VAD Patient? No   Pain Assessment   Currently in Pain? No/denies   Multiple Pain Sites No      Capillary Blood Glucose: Results for orders placed or performed during the hospital encounter of 03/30/16 (from the past 24 hour(s))  Glucose, capillary     Status: Abnormal   Collection Time: 03/30/16 10:31 AM  Result Value Ref Range   Glucose-Capillary 210 (H) 65 - 99 mg/dL  Glucose, capillary     Status: Abnormal   Collection Time: 03/30/16 11:45 AM  Result Value Ref Range   Glucose-Capillary 150 (H) 65 - 99 mg/dL        Exercise Prescription Changes - 03/30/16 1200    Exercise Review   Progression Yes   Response to Exercise   Blood Pressure (Admit) 108/64 mmHg   Blood Pressure (Exercise) 122/54 mmHg   Blood Pressure (Exit) 104/62 mmHg   Heart Rate (Admit) 50 bpm   Heart Rate (Exercise) 90 bpm   Heart Rate (Exit) 65 bpm   Oxygen Saturation (Admit) 96 %   Oxygen Saturation (Exercise) 89 %   Oxygen Saturation (Exit) 98 %   Rating of Perceived Exertion (Exercise) 13   Perceived Dyspnea (Exercise) 3   Duration Progress to 45 minutes of aerobic exercise without signs/symptoms of physical distress   Intensity THRR unchanged   Progression   Progression  Continue to progress workloads to maintain intensity without signs/symptoms of physical distress.   Resistance Training   Training Prescription Yes   Weight blue bands   Reps 10-12   Interval Training   Interval Training No   Oxygen   Oxygen Continuous   Liters 8   Bike   Level 5.5   Minutes 15   Track   Laps 13   Minutes 15     Goals Met:  Exercise tolerated well No report of cardiac concerns or symptoms Strength training completed today  Goals Unmet:  Not Applicable  Comments: Service time is from 10:30am to 11:50am    Dr. Rush Farmer is Medical Director for Pulmonary Rehab at [facility].

## 2016-04-04 ENCOUNTER — Encounter (HOSPITAL_COMMUNITY)
Admission: RE | Admit: 2016-04-04 | Discharge: 2016-04-04 | Disposition: A | Discharge status: Home / Self Care | Payer: PPO | Source: Ambulatory Visit | Attending: Internal Medicine | Admitting: Internal Medicine

## 2016-04-04 VITALS — Wt 248.2 lb

## 2016-04-04 DIAGNOSIS — J84112 Idiopathic pulmonary fibrosis: Secondary | ICD-10-CM | POA: Diagnosis not present

## 2016-04-04 NOTE — Progress Notes (Signed)
Daily Session Note  Patient Details  Name: Jerry Montgomery MRN: [id number] Date of Birth: 11-Mar-1956 Referring Provider:    Encounter Date: 04/04/2016  Check In:     Session Check In - 04/04/16 1116    Check-In   [facility]   Staff Present Rosebud Poles, RN, BSN;Cederic Mozley Ysidro Evert, RN;Portia Rollene Rotunda, RN, BSN;Ramon Dredge, RN, MHA;Molly diVincenzo, MS, ACSM RCEP, Exercise Physiologist   Supervising physician immediately available to respond to emergencies [facility] immediately available   Physician(s) Dr. Marily Memos   Medication changes reported     No   Fall or balance concerns reported    No   Warm-up and Cool-down Performed as group-led instruction   Resistance Training Performed Yes   VAD Patient? No   Pain Assessment   Currently in Pain? No/denies   Multiple Pain Sites No      Capillary Blood Glucose: No results found for this or any previous visit (from the past 24 hour(s)).      Exercise Prescription Changes - 04/04/16 1200    Response to Exercise   Blood Pressure (Admit) 110/60 mmHg   Blood Pressure (Exercise) 130/60 mmHg   Blood Pressure (Exit) 102/68 mmHg   Heart Rate (Admit) 56 bpm   Heart Rate (Exercise) 93 bpm   Heart Rate (Exit) 71 bpm   Oxygen Saturation (Admit) 98 %   Oxygen Saturation (Exercise) 88 %   Oxygen Saturation (Exit) 98 %   Rating of Perceived Exertion (Exercise) 13   Perceived Dyspnea (Exercise) 2   Duration Progress to 45 minutes of aerobic exercise without signs/symptoms of physical distress   Intensity THRR unchanged   Resistance Training   Training Prescription Yes   Weight blue bands   Reps 10-12   Interval Training   Interval Training No   Oxygen   Oxygen Continuous   Liters 8   Bike   Level 5.5   Minutes 15   NuStep   Level 6   Minutes 15   METs 3.6   Track   Laps 12   Minutes 15     Goals Met:  Exercise tolerated well No report of cardiac concerns or symptoms Strength training  completed today  Goals Unmet:  Not Applicable  Comments: Service time is from 1030 to 1200    Dr. Rush Farmer is Medical Director for Pulmonary Rehab at [facility].

## 2016-04-05 ENCOUNTER — Telehealth: Payer: Self-pay | Admitting: Family Medicine

## 2016-04-05 NOTE — Telephone Encounter (Signed)
Requesting refill on Ambien 10mg  to go to [facility] for 90 day supply - Ok to refill??

## 2016-04-06 ENCOUNTER — Encounter (HOSPITAL_COMMUNITY)
Admission: RE | Admit: 2016-04-06 | Discharge: 2016-04-06 | Disposition: A | Discharge status: Home / Self Care | Payer: PPO | Source: Ambulatory Visit | Attending: Internal Medicine | Admitting: Internal Medicine

## 2016-04-06 DIAGNOSIS — J84112 Idiopathic pulmonary fibrosis: Secondary | ICD-10-CM | POA: Diagnosis not present

## 2016-04-06 NOTE — Progress Notes (Signed)
Pulmonary Individual Treatment Plan  Patient Details  Name: Jerry Montgomery MRN: [id number] Date of Birth: Nov 18, 1955 Referring Provider:    Initial Encounter Date:       Pulmonary Rehab Walk Test from 01/11/2016 in [place]   Date  01/11/16      Visit Diagnosis: IPF (idiopathic pulmonary fibrosis) ([place])  Patient's Home Medications on Admission:   Current outpatient prescriptions:  .  ADVAIR HFA 115-21 MCG/ACT inhaler, Inhale 2 puffs into the lungs 2 (two) times daily. Reported on 02/25/2016, Disp: , Rfl:  .  albuterol (PROVENTIL HFA;VENTOLIN HFA) 108 (90 Base) MCG/ACT inhaler, Inhale 2 puffs into the lungs every 6 (six) hours as needed for wheezing., Disp: 3 Inhaler, Rfl: 3 .  ALPRAZolam (XANAX) 0.5 MG tablet, Take 1 tablet (0.5 mg total) by mouth 3 (three) times daily as needed for anxiety., Disp: 90 tablet, Rfl: 0 .  aspirin 81 MG tablet, Take 81 mg by mouth daily., Disp: , Rfl:  .  atorvastatin (LIPITOR) 40 MG tablet, Take 1 tablet by mouth  every night at bedtime, Disp: 90 tablet, Rfl: 1 .  blood glucose meter kit and supplies KIT, Dispense based on patient and insurance preference. Use up to four times daily as directed. (FOR ICD E11.65), Disp: 1 each, Rfl: 0 .  Blood Glucose Monitoring Suppl (ONE TOUCH ULTRA SYSTEM KIT) w/Device KIT, Use to monitor FSBS 4x daily for fluctuating blood sugars. Dx: E11.65., Disp: 1 each, Rfl: 1 .  clopidogrel (PLAVIX) 75 MG tablet, Take 1 tablet by mouth  every day, Disp: 90 tablet, Rfl: 3 .  cyclobenzaprine (FLEXERIL) 10 MG tablet, Take 1 tablet (10 mg total) by mouth 3 (three) times daily as needed for muscle spasms., Disp: 30 tablet, Rfl: 0 .  diltiazem (CARDIZEM) 30 MG tablet, Take 1 tablet (30 mg total) by mouth every 6 (six) hours., Disp: 360 tablet, Rfl: 1 .  furosemide (LASIX) 20 MG tablet, Take 1 tablet (20 mg total) by mouth daily as needed for fluid (fluid)., Disp: 90 tablet, Rfl: 3 .  glucose blood test strip,  Use to monitor FSBS 4x daily for fluctuating blood sugars. Dx: E11.65., Disp: 400 each, Rfl: 4 .  insulin aspart (NOVOLOG) 100 UNIT/ML injection, 10 UNITS WITH BREAKFAST AND 10 UNITS WITH LUNCH, 30 UNITS WITH SUPPER, Disp: 60 mL, Rfl: 4 .  insulin glargine (LANTUS) 100 UNIT/ML injection, Inject 0.6 mLs (60 Units total) into the skin at bedtime., Disp: 60 mL, Rfl: 3 .  isosorbide mononitrate (IMDUR) 60 MG 24 hr tablet, Take 60 mg by  mouth every day, Disp: 90 tablet, Rfl: 3 .  levothyroxine (SYNTHROID, LEVOTHROID) 125 MCG tablet, Take 1 tablet (125 mcg total) by mouth daily., Disp: 90 tablet, Rfl: 3 .  Menthol, Topical Analgesic, (BIOFREEZE EX), Apply 1 application topically as needed (for pain)., Disp: , Rfl:  .  metFORMIN (GLUCOPHAGE) 850 MG tablet, Take 1 tablet by mouth  twice a day, Disp: 180 tablet, Rfl: 3 .  metoprolol (TOPROL XL) 200 MG 24 hr tablet, Take 1 tablet (200 mg total) by mouth daily., Disp: 90 tablet, Rfl: 3 .  nitroGLYCERIN (NITROSTAT) 0.4 MG SL tablet, Place 1 tablet (0.4 mg total) under the tongue every 5 (five) minutes as needed for chest pain., Disp: 25 tablet, Rfl: 1 .  SYRINGE/NEEDLE, DISP, 1 ML (B-D SYRINGE/NEEDLE 1CC/25GX5/8) 25G X 5/8" 1 ML MISC, As directed, Disp: 100 each, Rfl: 5 .  tiotropium (SPIRIVA) 18 MCG inhalation capsule, Place  18 mcg into inhaler and inhale daily. Reported on 02/25/2016, Disp: , Rfl:  .  zolpidem (AMBIEN) 10 MG tablet, Take 1 tablet (10 mg total) by mouth at bedtime as needed for sleep., Disp: 90 tablet, Rfl: 1 .  zolpidem (AMBIEN) 10 MG tablet, , Disp: , Rfl:   Past Medical History: Past Medical History  Diagnosis Date  . Hypertension   . Hyperlipidemia   . Postinflammatory pulmonary fibrosis ([place])   . CAD (coronary artery disease)   . Pneumothorax on right 4/11  . COPD (chronic obstructive pulmonary disease) ([place])   . Atrial tachycardia ([place])   . Acute diastolic heart failure ([place])   . On home oxygen therapy     "3L; 24/7" (04/15/2015)   . Sleep apnea     "won't use CPAP" (04/14/2105)  . Hypothyroidism   . Type II diabetes mellitus ([place])   . WPW (Wolff-Parkinson-White syndrome)     Per Dr. Tanna Furry Note  . SVT (supraventricular tachycardia) ([place])     Per Dr. Tanna Furry note  . Atrial fibrillation ([facility])     Tobacco Use: History  Smoking status  . Former Smoker -- 1.50 packs/day for 35 years  . Types: Cigarettes  . Quit date: 10/24/2007  Smokeless tobacco  . Never Used    Labs: Recent Review Flowsheet Data    Labs for ITP Cardiac and Pulmonary Rehab Latest Ref Rng 07/06/2015 07/15/2015 07/20/2015 07/22/2015 03/06/2016   Cholestrol 125 - 200 mg/dL 131 - - - 92(L)   LDLCALC <130 mg/dL 62 - - - 40   HDL >=40 mg/dL 33(L) - - - 31(L)   Trlycerides <150 mg/dL 181(H) - - - 105   Hemoglobin A1c <5.7 % 8.6(H) - - - 8.9(H)   PHART 7.350 - 7.450 - 7.410 7.355 7.467(H) -   PCO2ART 35.0 - 45.0 mmHg - 38.0 46.0(H) 33.4(L) -   HCO3 20.0 - 24.0 mEq/L - 23.6 25.2(H) 23.9 -   TCO2 0 - 100 mmol/L - 24.8 26.6 24.9 -   ACIDBASEDEF 0.0 - 2.0 mmol/L - 0.4 - - -   O2SAT - - 97.2 93.1 95.6 -      Capillary Blood Glucose: Lab Results  Component Value Date   GLUCAP 150* 03/30/2016   GLUCAP 210* 03/30/2016   GLUCAP 182* 07/23/2015   GLUCAP 132* 07/23/2015   GLUCAP 138* 07/22/2015       POCT Glucose      01/13/16 1238 01/20/16 1256 01/25/16 1229 01/27/16 1238 02/01/16 1221   POCT Blood Glucose   Pre-Exercise 294 mg/dL 325 mg/dL 248 mg/dL 182 mg/dL 179 mg/dL   Post-Exercise 208 mg/dL  192 mg/dL 185 mg/dL 161 mg/dL     02/03/16 1349 02/08/16 1227 02/10/16 1348 02/15/16 1215 02/17/16 1226   POCT Blood Glucose   Pre-Exercise 208 mg/dL 235 mg/dL 157 mg/dL 256 mg/dL 157 mg/dL   Post-Exercise 188 mg/dL 212 mg/dL 262 mg/dL 150 mg/dL 84 mg/dL  [place]   Post-Exercise #2     106 mg/dL     02/22/16 1228 02/24/16 1234 02/29/16 1247 03/02/16 1405 03/07/16 1210   POCT Blood Glucose   Pre-Exercise 157 mg/dL 128 mg/dL  given a banana pre  exercise 285 mg/dL 152 mg/dL 168 mg/dL   Post-Exercise 147 mg/dL 133 mg/dL 212 mg/dL 90 mg/dL 131 mg/dL     03/09/16 1228           POCT Blood Glucose   Pre-Exercise 136 mg/dL       Post-Exercise 131 mg/dL  ADL UCSD:     Pulmonary Assessment Scores      01/11/16 1412       ADL UCSD   SOB Score total 31        Pulmonary Function Assessment:     Pulmonary Function Assessment - 01/07/16 1111    Breath   Bilateral Breath Sounds Other   Other fine crackles in bases   Shortness of Breath Yes;Limiting activity      Exercise Target Goals:    Exercise Program Goal: Individual exercise prescription set with THRR, safety & activity barriers. Participant demonstrates ability to understand and report RPE using BORG scale, to self-measure pulse accurately, and to acknowledge the importance of the exercise prescription.  Exercise Prescription Goal: Starting with aerobic activity 30 plus minutes a day, 3 days per week for initial exercise prescription. Provide home exercise prescription and guidelines that participant acknowledges understanding prior to discharge.  Activity Barriers & Risk Stratification:     Activity Barriers & Cardiac Risk Stratification - 01/07/16 1110    Activity Barriers & Cardiac Risk Stratification   Activity Barriers Right Knee Replacement;Balance Concerns;Deconditioning;Joint Problems;Shortness of Breath      6 Minute Walk:     6 Minute Walk      01/11/16 1634       6 Minute Walk   Phase Initial     Distance 1072 feet     Walk Time 4.48 minutes     # of Rest Breaks 1     MPH 2.72     METS 2.87     RPE 11     Perceived Dyspnea  3     VO2 Peak 10.04     Symptoms No     Resting HR 77 bpm     Resting BP 116/76 mmHg     Max Ex. HR 120 bpm     Max Ex. BP 140/78 mmHg     2 Minute Post BP 142/82 mmHg     Interval HR   Baseline HR 77     1 Minute HR 91     2 Minute HR 120     3 Minute HR 105     4 Minute HR 105     5 Minute  HR 101     6 Minute HR 100     2 Minute Post HR 90     Interval Heart Rate? Yes     Interval Oxygen   Interval Oxygen? Yes     Baseline Oxygen Saturation % 96 %     Baseline Liters of Oxygen 3 L  3 liters O2     1 Minute Oxygen Saturation % 91 %     1 Minute Liters of Oxygen 3 L     2 Minute Oxygen Saturation % 86 %     2 Minute Liters of Oxygen 3 L     3 Minute Oxygen Saturation % 84 %     3 Minute Liters of Oxygen 3 L     4 Minute Oxygen Saturation % 84 %     4 Minute Liters of Oxygen 3 L     5 Minute Oxygen Saturation % 89 %     5 Minute Liters of Oxygen 3 L     6 Minute Oxygen Saturation % 86 %     6 Minute Liters of Oxygen 3 L     2 Minute Post Oxygen Saturation % 94 %     2 Minute Post  Liters of Oxygen 3 L     Pre/Post BP   Baseline BP 116/76 mmHg     6 Minute BP 140/78 mmHg     Pre/Post BP? Yes        Initial Exercise Prescription:     Initial Exercise Prescription - 01/11/16 1600    Date of Initial Exercise RX and Referring Provider   Date 01/11/16   Oxygen   Oxygen Continuous   Liters 3   Bike   Level 1.2   Minutes 15   NuStep   Level 1   Minutes 15   METs 2.4   Track   Laps 8   Minutes 15   Prescription Details   Frequency (times per week) 2   Duration Progress to 45 minutes of aerobic exercise without signs/symptoms of physical distress   Intensity   THRR 40-80% of Max Heartrate 63-126   Ratings of Perceived Exertion 11-13   Perceived Dyspnea 0-4   Resistance Training   Training Prescription Yes   Weight orange bands   Reps 10-12      Perform Capillary Blood Glucose checks as needed.  Exercise Prescription Changes:     Exercise Prescription Changes      01/13/16 1200 01/20/16 1200 01/25/16 1200 01/27/16 1200 02/01/16 1200   Exercise Review   Progression No  Yes No Yes   Response to Exercise   Blood Pressure (Admit) 124/74 mmHg 110/64 mmHg 132/80 mmHg 110/60 mmHg 118/70 mmHg   Blood Pressure (Exercise) 158/62 mmHg  172/90 mmHg 120/60  mmHg 130/60 mmHg   Blood Pressure (Exit) 104/60 mmHg  92/88 mmHg 108/68 mmHg 112/66 mmHg   Heart Rate (Admit) 60 bpm 76 bpm 81 bpm 65 bpm 59 bpm   Heart Rate (Exercise) 90 bpm  110 bpm 75 bpm 82 bpm   Heart Rate (Exit) 73 bpm  92 bpm 61 bpm 58 bpm   Oxygen Saturation (Admit) 92 % 89 % 95 % 96 % 98 %   Oxygen Saturation (Exercise) 86 %  85 % 85 %  Desaturated to 85% on 6L walking on track, rest, PLB improve 88 %  Desaturated to 85% on 6L walking on track, rest, PLB improve   Oxygen Saturation (Exit) 96 %  96 % 96 % 96 %   Rating of Perceived Exertion (Exercise) 11  13 13 13    Perceived Dyspnea (Exercise) 1  1 1 1    Symptoms --  desaturated on exercise stations, o2 increased to 4 L  none --  Desaturated on walking track on 6 L, rest and PLB encouraged --  Desaturated on walking track on 6 L, rest and PLB encouraged   Comments --  increased to 4 L to prevent desaturations below 88  increased O2 to 5L on bike and 6L on track     Duration Progress to 45 minutes of aerobic exercise without signs/symptoms of physical distress  Progress to 45 minutes of aerobic exercise without signs/symptoms of physical distress Progress to 45 minutes of aerobic exercise without signs/symptoms of physical distress Progress to 45 minutes of aerobic exercise without signs/symptoms of physical distress   Intensity Other (comment)  THRR unchanged THRR unchanged THRR unchanged   Progression   Progression --  40-80% HRR  Continue to progress workloads to maintain intensity without signs/symptoms of physical distress. Continue to progress workloads to maintain intensity without signs/symptoms of physical distress. Continue to progress workloads to maintain intensity without signs/symptoms of physical distress.   Resistance Training  Training Prescription Yes  Yes Yes Yes   Weight orange bands  orange bands orange bands blue bands   Reps 10-12  10-12 10-12 10-12   Interval Training   Interval Training No  No No No    Oxygen   Oxygen Continuous Continuous Continuous Continuous Continuous   Liters 4 3 6 6 6    Bike   Level 2.5  2.5 2.5 3.5   [place]   42     Minutes 15  15 15 15    NuStep   Level 3  3  5    Minutes 15  15  15    METs 2.2  2.5  2.9   Track   Laps   12 13 12    Minutes   15 15 15      02/03/16 1300 02/08/16 1200 02/10/16 1230 02/15/16 1200 02/17/16 1215   Exercise Review   Progression No       Response to Exercise   Blood Pressure (Admit) 110/70 mmHg 122/74 mmHg 98/60 mmHg 104/60 mmHg 100/66 mmHg   Blood Pressure (Exercise) 142/70 mmHg 150/76 mmHg 112/70 mmHg 132/66 mmHg 122/64 mmHg   Blood Pressure (Exit) 110/64 mmHg 110/60 mmHg 108/50 mmHg 92/60 mmHg  recheck BP 121/83 120/70 mmHg   Heart Rate (Admit) 59 bpm 59 bpm 70 bpm 65 bpm 56 bpm   Heart Rate (Exercise) 87 bpm 84 bpm 79 bpm 94 bpm 85 bpm   Heart Rate (Exit) 63 bpm 57 bpm 69 bpm 75 bpm 68 bpm   Oxygen Saturation (Admit) 96 % 97 % 98 % 98 % 99 %   Oxygen Saturation (Exercise) 89 % 89 % 83 %  pt forgot to turn O2 up to 8 liters with exercise 89 % 88 %   Oxygen Saturation (Exit) 92 % 99 % 97 % 94 % 98 %   Rating of Perceived Exertion (Exercise) 13 11 11 11 13    Perceived Dyspnea (Exercise) 1 2 1 1 1    Duration Progress to 45 minutes of aerobic exercise without signs/symptoms of physical distress Progress to 45 minutes of aerobic exercise without signs/symptoms of physical distress Progress to 45 minutes of aerobic exercise without signs/symptoms of physical distress Progress to 45 minutes of aerobic exercise without signs/symptoms of physical distress Progress to 45 minutes of aerobic exercise without signs/symptoms of physical distress   Intensity THRR unchanged THRR unchanged THRR unchanged THRR unchanged THRR unchanged   Progression   Progression Continue to progress workloads to maintain intensity without signs/symptoms of physical distress. Continue to progress workloads to maintain intensity without signs/symptoms of physical  distress. Continue to progress workloads to maintain intensity without signs/symptoms of physical distress. Continue to progress workloads to maintain intensity without signs/symptoms of physical distress. Continue to progress workloads to maintain intensity without signs/symptoms of physical distress.   Resistance Training   Training Prescription Yes Yes Yes Yes Yes   Weight blue bands blue bands blue bands blue bands blue bands   Reps 10-12 10-12 10-12 10-12 10-12   Interval Training   Interval Training No No No No No   Oxygen   Oxygen Continuous Continuous Continuous Continuous Continuous   Liters 6 6 6 6 8    Bike   Level  3.5 3.5 3.5 3.5   Minutes  15 15 15 15    NuStep   Level 5 5 4 5 5    Minutes 15 15 15 15 15    METs 2.9 2.6 2.3 4.5 3.9   Track   Laps 10 8  12 14   Minutes 15 15  15 15      02/22/16 1200 02/24/16 1200 02/29/16 1200 03/02/16 1400 03/07/16 1200   Exercise Review   Progression Yes    Yes   Response to Exercise   Blood Pressure (Admit) 132/70 mmHg 120/64 mmHg 104/60 mmHg 104/64 mmHg 100/62 mmHg   Blood Pressure (Exercise) 160/76 mmHg 140/80 mmHg 100/60 mmHg 122/70 mmHg 132/70 mmHg   Blood Pressure (Exit) 122/70 mmHg 100/60 mmHg 116/64 mmHg 126/72 mmHg 100/60 mmHg   Heart Rate (Admit) 54 bpm 63 bpm 63 bpm 60 bpm 55 bpm   Heart Rate (Exercise) 99 bpm 88 bpm 84 bpm 79 bpm 78 bpm   Heart Rate (Exit) 67 bpm 66 bpm 60 bpm 70 bpm 62 bpm   Oxygen Saturation (Admit) 98 % 97 % 99 % 96 % 99 %   Oxygen Saturation (Exercise) 82 % 88 % 89 % 90 % 91 %   Oxygen Saturation (Exit) 97 % 98 % 98 % 98 % 98 %   Rating of Perceived Exertion (Exercise) 13 13 11 11 11    Perceived Dyspnea (Exercise) 3 1 1 1 1    Comments   --  Discussed home exercise prescription with patient     Duration Progress to 45 minutes of aerobic exercise without signs/symptoms of physical distress Progress to 45 minutes of aerobic exercise without signs/symptoms of physical distress Progress to 45 minutes of  aerobic exercise without signs/symptoms of physical distress Progress to 45 minutes of aerobic exercise without signs/symptoms of physical distress Progress to 45 minutes of aerobic exercise without signs/symptoms of physical distress   Intensity THRR unchanged THRR unchanged THRR unchanged THRR unchanged THRR unchanged   Progression   Progression Continue to progress workloads to maintain intensity without signs/symptoms of physical distress. Continue to progress workloads to maintain intensity without signs/symptoms of physical distress. Continue to progress workloads to maintain intensity without signs/symptoms of physical distress. Continue to progress workloads to maintain intensity without signs/symptoms of physical distress. Continue to progress workloads to maintain intensity without signs/symptoms of physical distress.   Resistance Training   Training Prescription Yes Yes Yes Yes Yes   Weight blue bands blue bands blue bands blue bands blue bands   Reps 10-12 10-12 10-12 10-12 10-12   Interval Training   Interval Training No No  No No   Oxygen   Oxygen Continuous Continuous Continuous Continuous Continuous   Liters 8 8 8 8 8    Bike   Level 4 4 4   4.5   Minutes 15 15 15  15    NuStep   Level 5 5 5 5 5    Minutes 15 15 15 15 15    METs 3.6 2.9 3.3 4.3 2.9   Track   Laps 11  10 9 12    Minutes 15  15 15 15    Home Exercise Plan   Plans to continue exercise at   Home     Frequency   Add 3 additional days to program exercise sessions.       03/09/16 1200 03/14/16 1215 03/16/16 1200 03/28/16 1200 03/30/16 1200   Exercise Review   Progression Yes    Yes   Response to Exercise   Blood Pressure (Admit) 100/60 mmHg 98/64 mmHg 92/52 mmHg 96/56 mmHg 108/64 mmHg   Blood Pressure (Exercise) 120/64 mmHg 150/78 mmHg 118/68 mmHg 140/82 mmHg 122/54 mmHg   Blood Pressure (Exit) 106/70 mmHg 106/61 mmHg 98/60 mmHg 103/69 mmHg 104/62 mmHg   Heart Rate (Admit)  56 bpm 57 bpm 51 bpm 53 bpm 50 bpm    Heart Rate (Exercise) 86 bpm 84 bpm 75 bpm 83 bpm 90 bpm   Heart Rate (Exit) 71 bpm 67 bpm 57 bpm 66 bpm 65 bpm   Oxygen Saturation (Admit) 99 % 97 % 98 % 99 % 96 %   Oxygen Saturation (Exercise) 90 % 88 % 88 % 88 % 89 %   Oxygen Saturation (Exit) 98 % 97 % 98 % 97 % 98 %   Rating of Perceived Exertion (Exercise) 13 13 11 13 13    Perceived Dyspnea (Exercise) 1 1 1 2 3    Duration Progress to 45 minutes of aerobic exercise without signs/symptoms of physical distress Progress to 45 minutes of aerobic exercise without signs/symptoms of physical distress Progress to 45 minutes of aerobic exercise without signs/symptoms of physical distress Progress to 45 minutes of aerobic exercise without signs/symptoms of physical distress Progress to 45 minutes of aerobic exercise without signs/symptoms of physical distress   Intensity THRR unchanged THRR unchanged THRR unchanged THRR unchanged THRR unchanged   Progression   Progression Continue to progress workloads to maintain intensity without signs/symptoms of physical distress. Continue to progress workloads to maintain intensity without signs/symptoms of physical distress. Continue to progress workloads to maintain intensity without signs/symptoms of physical distress. Continue to progress workloads to maintain intensity without signs/symptoms of physical distress. Continue to progress workloads to maintain intensity without signs/symptoms of physical distress.   Resistance Training   Training Prescription Yes Yes Yes Yes Yes   Weight blue bands blue bands blue bands blue bands blue bands   Reps 10-12 10-12 10-12 10-12 10-12   Interval Training   Interval Training No No No No No   Oxygen   Oxygen Continuous Continuous Continuous Continuous Continuous   Liters 8 8 8 8 8    Bike   Level 4.5 4.5 4.5 4.5 5.5   Minutes 15 15 15 15 15    NuStep   Level 6 6  6     Minutes 15 15  15     METs 3.4 3  3.1    Track   Laps  15 10 15 13    Minutes  15 15 15 15       04/04/16 1200           Response to Exercise   Blood Pressure (Admit) 110/60 mmHg       Blood Pressure (Exercise) 130/60 mmHg       Blood Pressure (Exit) 102/68 mmHg       Heart Rate (Admit) 56 bpm       Heart Rate (Exercise) 93 bpm       Heart Rate (Exit) 71 bpm       Oxygen Saturation (Admit) 98 %       Oxygen Saturation (Exercise) 88 %       Oxygen Saturation (Exit) 98 %       Rating of Perceived Exertion (Exercise) 13       Perceived Dyspnea (Exercise) 2       Duration Progress to 45 minutes of aerobic exercise without signs/symptoms of physical distress       Intensity THRR unchanged       Resistance Training   Training Prescription Yes       Weight blue bands       Reps 10-12       Interval Training   Interval Training No       Oxygen   Oxygen Continuous  Liters 8       Bike   Level 5.5       Minutes 15       NuStep   Level 6       Minutes 15       METs 3.6       Track   Laps 12       Minutes 15          Exercise Comments:     Exercise Comments      01/17/16 1420 02/08/16 1651 02/10/16 0839 03/09/16 0852 04/06/16 0757   Exercise Comments Starting off well with exercise tolerance, will continue to follow exericse progression Starting off well with exercise tolerance, will continue to follow exericse progression Starting off well with exercise tolerance, will continue to follow exercise progression.  Patient now up to walking 12 laps. Will continue to monitor and progress as appropriate. Patient is now biking at 5.5, walking over 10 laps, and is on level 6 on the stepper. Will cont. to monitor exercise intenisty.       Discharge Exercise Prescription (Final Exercise Prescription Changes):     Exercise Prescription Changes - 04/04/16 1200    Response to Exercise   Blood Pressure (Admit) 110/60 mmHg   Blood Pressure (Exercise) 130/60 mmHg   Blood Pressure (Exit) 102/68 mmHg   Heart Rate (Admit) 56 bpm   Heart Rate (Exercise) 93 bpm   Heart Rate  (Exit) 71 bpm   Oxygen Saturation (Admit) 98 %   Oxygen Saturation (Exercise) 88 %   Oxygen Saturation (Exit) 98 %   Rating of Perceived Exertion (Exercise) 13   Perceived Dyspnea (Exercise) 2   Duration Progress to 45 minutes of aerobic exercise without signs/symptoms of physical distress   Intensity THRR unchanged   Resistance Training   Training Prescription Yes   Weight blue bands   Reps 10-12   Interval Training   Interval Training No   Oxygen   Oxygen Continuous   Liters 8   Bike   Level 5.5   Minutes 15   NuStep   Level 6   Minutes 15   METs 3.6   Track   Laps 12   Minutes 15       Nutrition:  Target Goals: Understanding of nutrition guidelines, daily intake of sodium <1539m, cholesterol <2090m calories 30% from fat and 7% or less from saturated fats, daily to have 5 or more servings of fruits and vegetables.  Biometrics:     Pre Biometrics - 01/07/16 1112    Pre Biometrics   Grip Strength 48 kg       Nutrition Therapy Plan and Nutrition Goals:     Nutrition Therapy & Goals - 01/31/16 0944    Nutrition Therapy   Diet Diabetic, Low Sodium   Personal Nutrition Goals   Personal Goal #1 Wt loss of 6-24 lb at graduation from Pulmonary Rehab   Personal Goal #2 Improve glycemic control as evidenced by an A1c < 7.0   Intervention Plan   Intervention Prescribe, educate and counsel regarding individualized specific dietary modifications aiming towards targeted core components such as weight, hypertension, lipid management, diabetes, heart failure and other comorbidities.   Expected Outcomes Short Term Goal: Understand basic principles of dietary content, such as calories, fat, sodium, cholesterol and nutrients.;Long Term Goal: Adherence to prescribed nutrition plan.      Nutrition Discharge: Rate Your Plate Scores:     Nutrition Assessments - 01/31/16 0943    Rate  Your Plate Scores   Pre Score 45      Psychosocial: Target Goals: Acknowledge presence  or absence of depression, maximize coping skills, provide positive support system. Participant is able to verbalize types and ability to use techniques and skills needed for reducing stress and depression.  Initial Review & Psychosocial Screening:     Initial Psych Review & Screening - 01/07/16 1117    Initial Review   Current issues with Current Stress Concerns;Current Sleep Concerns   Source of Stress Concerns Unable to participate in former interests or hobbies;Unable to perform yard/household activities  ex wife medical condition   [place]? Yes   Barriers   Psychosocial barriers to participate in program Psychosocial barriers identified (see note);The patient should benefit from training in stress management and relaxation.   Screening Interventions   Interventions Encouraged to exercise      Quality of Life Scores:     Quality of Life - 01/11/16 1411    Quality of Life Scores   Health/Function Pre 16.77 %   Socioeconomic Pre 19.31 %   Psych/Spiritual Pre 21.5 %   Family Pre 20.13 %   GLOBAL Pre 18.65 %      PHQ-9:     Recent Review Flowsheet Data    Depression screen [facility] [facility] 2/9 01/07/2016   Decreased Interest 0   Down, Depressed, Hopeless 0   PHQ - 2 Score 0      Psychosocial Evaluation and Intervention:   Psychosocial Re-Evaluation:     Psychosocial Re-Evaluation      01/24/16 0719 01/24/16 0720 02/10/16 0808 03/07/16 1559 03/09/16 1352   Psychosocial Re-Evaluation   Interventions  Encouraged to attend Pulmonary Rehabilitation for the exercise Encouraged to attend Pulmonary Rehabilitation for the exercise (p) Encouraged to attend Pulmonary Rehabilitation for the exercise Encouraged to attend Pulmonary Rehabilitation for the exercise   Comments patient has only attended 1 exercise session. Will continue to evaluate progress towards psycosocial goals       Continued Psychosocial Services Needed Yes         04/06/16 1254            Psychosocial Re-Evaluation   Interventions Encouraged to attend Pulmonary Rehabilitation for the exercise       Comments patient continues to deny psychosocial barriers       Continued Psychosocial Services Needed Yes         Education: Education Goals: Education classes will be provided on a weekly basis, covering required topics. Participant will state understanding/return demonstration of topics presented.  Learning Barriers/Preferences:     Learning Barriers/Preferences - 01/07/16 1111    Learning Barriers/Preferences   Learning Barriers None   Learning Preferences Individual Instruction;Written Material;Group Instruction      Education Topics: Risk Factor Reduction:  -Group instruction that is supported by a PowerPoint presentation. Instructor discusses the definition of a risk factor, different risk factors for pulmonary disease, and how the heart and lungs work together.     Nutrition for Pulmonary Patient:  -Group instruction provided by PowerPoint slides, verbal discussion, and written materials to support subject matter. The instructor gives an explanation and review of healthy diet recommendations, which includes a discussion on weight management, recommendations for fruit and vegetable consumption, as well as protein, fluid, caffeine, fiber, sodium, sugar, and alcohol. Tips for eating when patients are short of breath are discussed.   Pursed Lip Breathing:  -Group instruction that is supported by demonstration and informational handouts. Instructor discusses  the benefits of pursed lip and diaphragmatic breathing and detailed demonstration on how to preform both.            PULMONARY REHAB OTHER RESPIRATORY from 03/30/2016 in [place]   Date  02/03/16 [03/16/16]   Educator  RT   Instruction Review Code  2- meets goals/outcomes      Oxygen Safety:  -Group instruction provided by PowerPoint, verbal discussion, and written material to  support subject matter. There is an overview of "What is Oxygen" and "Why do we need it".  Instructor also reviews how to create a safe environment for oxygen use, the importance of using oxygen as prescribed, and the risks of noncompliance. There is a brief discussion on traveling with oxygen and resources the patient may utilize.   Oxygen Equipment:  -Group instruction provided by [facility] Staff utilizing handouts, written materials, and equipment demonstrations.      PULMONARY REHAB OTHER RESPIRATORY from 03/30/2016 in [place]   Date  02/10/16   Educator  [employer] G[employer] rep   Instruction Review Code  2- meets goals/outcomes      Signs and Symptoms:  -Group instruction provided by written material and verbal discussion to support subject matter. Warning signs and symptoms of infection, stroke, and heart attack are reviewed and when to call the physician/911 reinforced. Tips for preventing the spread of infection discussed.   Advanced Directives:  -Group instruction provided by verbal instruction and written material to support subject matter. Instructor reviews Advanced Directive laws and proper instruction for filling out document.      PULMONARY REHAB OTHER RESPIRATORY from 03/30/2016 in [place]   Date  03/09/16   Educator  Jeanella Craze   Instruction Review Code  2- meets goals/outcomes      Pulmonary Video:  -Group video education that reviews the importance of medication and oxygen compliance, exercise, good nutrition, pulmonary hygiene, and pursed lip and diaphragmatic breathing for the pulmonary patient.      PULMONARY REHAB OTHER RESPIRATORY from 03/30/2016 in [place]   Date  03/30/16   Instruction Review Code  2- meets goals/outcomes      Exercise for the Pulmonary Patient:  -Group instruction that is supported by a PowerPoint presentation. Instructor discusses benefits of  exercise, core components of exercise, frequency, duration, and intensity of an exercise routine, importance of utilizing pulse oximetry during exercise, safety while exercising, and options of places to exercise outside of rehab.        PULMONARY REHAB OTHER RESPIRATORY from 03/30/2016 in [place]   Date  02/24/16   Educator  ep   Instruction Review Code  2- meets goals/outcomes      Pulmonary Medications:  -Verbally interactive group education provided by instructor with focus on inhaled medications and proper administration.      PULMONARY REHAB OTHER RESPIRATORY from 03/30/2016 in [place]   Date  01/20/16   Educator  Pharm D   Instruction Review Code  2- meets goals/outcomes      Anatomy and Physiology of the Respiratory System and Intimacy:  -Group instruction provided by PowerPoint, verbal discussion, and written material to support subject matter. Instructor reviews respiratory cycle and anatomical components of the respiratory system and their functions. Instructor also reviews differences in obstructive and restrictive respiratory diseases with examples of each. Intimacy, Sex, and Sexuality differences are reviewed with  a discussion on how relationships can change when diagnosed with pulmonary disease. Common sexual concerns are reviewed.      PULMONARY REHAB OTHER RESPIRATORY from 03/30/2016 in [place]   Date  03/02/16   Educator  rn   Instruction Review Code  2- meets goals/outcomes      Knowledge Questionnaire Score:     Knowledge Questionnaire Score - 01/11/16 1410    Knowledge Questionnaire Score   Pre Score 11/13      Core Components/Risk Factors/Patient Goals at Admission:     Personal Goals and Risk Factors at Admission - 01/07/16 1114    Core Components/Risk Factors/Patient Goals on Admission    Weight Management Obesity;Yes   Intervention Weight Management:  Develop a combined nutrition and exercise program designed to reach desired caloric intake, while maintaining appropriate intake of nutrient and fiber, sodium and fats, and appropriate energy expenditure required for the weight goal.;Weight Management: Provide education and appropriate resources to help participant work on and attain dietary goals.;Obesity: Provide education and appropriate resources to help participant work on and attain dietary goals.;Weight Management/Obesity: Establish reasonable short term and long term weight goals.   Admit Weight 254 lb 6.6 oz (115.4 kg)   Expected Outcomes Short Term: Continue to assess and modify interventions until short term weight is achieved.;Long Term: Adherence to nutrition and physical activity/exercise program aimed toward attainment of established weight goal.   Sedentary Yes   Intervention Provide advice, education, support and counseling about physical activity/exercise needs.;Develop an individualized exercise prescription for aerobic and resistive training based on initial evaluation findings, risk stratification, comorbidities and participant's personal goals.   Expected Outcomes Achievement of increased cardiorespiratory fitness and enhanced flexibility, muscular endurance and strength shown through measurements of functional capaciy and personal statement of participant.   Improve shortness of breath with ADL's Yes   Intervention Provide education, individualized exercise plan and daily activity instruction to help decrease symptoms of SOB with activities of daily living.   Expected Outcomes Short Term: Achieves a reduction of symptoms when performing activities of daily living.   Develop more efficient breathing techniques such as purse lipped breathing and diaphragmatic breathing; and practicing self-pacing with activity Yes   Intervention Provide education, demonstration and support about specific breathing techniuqes utilized for more efficient  breathing. Include techniques such as pursed lipped breathing, diaphragmatic breathing and self-pacing activity.   Expected Outcomes Short Term: Participant will be able to demonstrate and use breathing techniques as needed throughout daily activities.   Increase knowledge of respiratory medications and ability to use respiratory devices properly  Yes   Intervention Provide education and demonstration as needed of appropriate use of medications, inhalers, and oxygen therapy.   Expected Outcomes Short Term: Achieves understanding of medications use. Understands that oxygen is a medication prescribed by physician. Demonstrates appropriate use of inhaler and oxygen therapy.   Diabetes Yes   Intervention Provide education about signs/symptoms and action to take for hypo/hyperglycemia.;Provide education about proper nutrition, including hydration, and aerobic/resistive exercise prescription along with prescribed medications to achieve blood glucose in normal ranges: Fasting glucose 65-99 mg/dL   Expected Outcomes Short Term: Participant verbalizes understanding of the signs/symptoms and immediate care of hyper/hypoglycemia, proper foot care and importance of medication, aerobic/resistive exercise and nutrition plan for blood glucose control.;Long Term: Attainment of HbA1C < 7%.   Hypertension Yes   Intervention Provide education on lifestyle modifcations including regular physical activity/exercise, weight management, moderate sodium restriction and increased consumption of fresh fruit,  vegetables, and low fat dairy, alcohol moderation, and smoking cessation.;Monitor prescription use compliance.   Expected Outcomes Short Term: Continued assessment and intervention until BP is < 140/87m HG in hypertensive participants. < 130/853mHG in hypertensive participants with diabetes, heart failure or chronic kidney disease.;Long Term: Maintenance of blood pressure at goal levels.   Personal Goal Other Yes   Personal  Goal possibly be able to golf and bowl again   Intervention build stamina and stregnth thru increasing workloads on exercise equiptment   Expected Outcomes patient will feel that he has enough stamina and stregnth to participate in the above activities even if activity has time modifications      Core Components/Risk Factors/Patient Goals Review:      Goals and Risk Factor Review      01/24/16 0720 02/10/16 0807 03/07/16 1556 04/06/16 1254     Core Components/Risk Factors/Patient Goals Review   Personal Goals Review  Weight Management/Obesity;Increase Strength and Stamina;Develop more efficient breathing techniques such as purse lipped breathing and diaphragmatic breathing and practicing self-pacing with activity.;Improve shortness of breath with ADL's;Sedentary Weight Management/Obesity;Increase Strength and Stamina;Develop more efficient breathing techniques such as purse lipped breathing and diaphragmatic breathing and practicing self-pacing with activity.;Improve shortness of breath with ADL's;Sedentary Weight Management/Obesity;Increase Strength and Stamina;Develop more efficient breathing techniques such as purse lipped breathing and diaphragmatic breathing and practicing self-pacing with activity.;Improve shortness of breath with ADL's;Sedentary    Review Patient has only attended 1 exercise session. Will continue to evaluate progression of personal goals. see "comments" section on ITP see "comments" section on ITP see "comments" section on ITP    Expected Outcomes see expected outcomes on intial review see Admission expected outcomes see Admission expected outcomes see Admission expected outcomes       Core Components/Risk Factors/Patient Goals at Discharge (Final Review):      Goals and Risk Factor Review - 04/06/16 1254    Core Components/Risk Factors/Patient Goals Review   Personal Goals Review Weight Management/Obesity;Increase Strength and Stamina;Develop more efficient breathing  techniques such as purse lipped breathing and diaphragmatic breathing and practicing self-pacing with activity.;Improve shortness of breath with ADL's;Sedentary   Review see "comments" section on ITP   Expected Outcomes see Admission expected outcomes      ITP Comments:   Comments: ITP REVIEW Pt is making expected progress toward personal goals after completing 22 sessions. Recommend continued exercise, life style modification, education, and utilization of breathing techniques to increase stamina and strength and decrease shortness of breath with exertion.

## 2016-04-06 NOTE — Telephone Encounter (Signed)
ok 

## 2016-04-07 MED ORDER — ZOLPIDEM TARTRATE 10 MG PO TABS: 10.0000 mg | ORAL_TABLET | Freq: Every day | ORAL | Status: DC

## 2016-04-07 NOTE — Telephone Encounter (Signed)
Medication faxed to pill pack

## 2016-04-09 DIAGNOSIS — J449 Chronic obstructive pulmonary disease, unspecified: Secondary | ICD-10-CM | POA: Diagnosis not present

## 2016-04-09 DIAGNOSIS — J438 Other emphysema: Secondary | ICD-10-CM | POA: Diagnosis not present

## 2016-04-09 DIAGNOSIS — R269 Unspecified abnormalities of gait and mobility: Secondary | ICD-10-CM | POA: Diagnosis not present

## 2016-04-09 DIAGNOSIS — J8410 Pulmonary fibrosis, unspecified: Secondary | ICD-10-CM | POA: Diagnosis not present

## 2016-04-11 ENCOUNTER — Encounter (HOSPITAL_COMMUNITY)
Admission: RE | Admit: 2016-04-11 | Discharge: 2016-04-11 | Disposition: A | Discharge status: Home / Self Care | Payer: PPO | Source: Ambulatory Visit | Attending: Internal Medicine | Admitting: Internal Medicine

## 2016-04-11 VITALS — Wt 245.2 lb

## 2016-04-11 DIAGNOSIS — J84112 Idiopathic pulmonary fibrosis: Secondary | ICD-10-CM | POA: Diagnosis not present

## 2016-04-11 NOTE — Progress Notes (Signed)
Daily Session Note  Patient Details  Name: Jerry Montgomery MRN: [id number] Date of Birth: [birth year] Referring Provider:    Encounter Date: 04/11/2016  Check In:     Session Check In - 04/11/16 1243    Check-In   Location [facility]   Staff Present Rosebud Poles, RN, BSN;Molly diVincenzo, MS, ACSM RCEP, Exercise Physiologist;Lisa Ysidro Evert, RN   Supervising physician immediately available to respond to emergencies [facility] immediately available   Physician(s) Dr. Marily Memos   Medication changes reported     No   Fall or balance concerns reported    No   Warm-up and Cool-down Performed as group-led instruction   Resistance Training Performed No   VAD Patient? No   Pain Assessment   Currently in Pain? No/denies   Multiple Pain Sites No      Capillary Blood Glucose: No results found for this or any previous visit (from the past 24 hour(s)).     POCT Glucose - 04/11/16 1244    POCT Blood Glucose   Pre-Exercise 165 mg/dL   Post-Exercise 134 mg/dL         Exercise Prescription Changes - 04/11/16 1200    Response to Exercise   Blood Pressure (Admit) 110/78 mmHg   Blood Pressure (Exercise) 124/60 mmHg   Blood Pressure (Exit) 104/50 mmHg   Heart Rate (Admit) 54 bpm   Heart Rate (Exercise) 90 bpm   Heart Rate (Exit) 58 bpm   Oxygen Saturation (Admit) 99 %   Oxygen Saturation (Exercise) 91 %   Oxygen Saturation (Exit) 98 %   Rating of Perceived Exertion (Exercise) 11   Perceived Dyspnea (Exercise) 0   Duration Progress to 45 minutes of aerobic exercise without signs/symptoms of physical distress   Intensity THRR unchanged   Progression   Progression Continue to progress workloads to maintain intensity without signs/symptoms of physical distress.   Resistance Training   Training Prescription Yes   Weight blue bands   Reps 10-12   Interval Training   Interval Training No   Oxygen   Oxygen Continuous   Liters 8   Bike   Level 5.5   Minutes 15   NuStep   Level 6   Minutes 15   METs --   Track   Laps 13   Minutes 15     Goals Met:  Independence with exercise equipment Using PLB without cueing & demonstrates good technique Exercise tolerated well No report of cardiac concerns or symptoms  Goals Unmet:  Not Applicable  Comments: Service time is from 1030 to 1215    Dr. Rush Farmer is Medical Director for Pulmonary Rehab at [facility].

## 2016-04-13 ENCOUNTER — Encounter (HOSPITAL_COMMUNITY)
Admission: RE | Admit: 2016-04-13 | Discharge: 2016-04-13 | Disposition: A | Discharge status: Home / Self Care | Payer: PPO | Source: Ambulatory Visit | Attending: Internal Medicine | Admitting: Internal Medicine

## 2016-04-13 VITALS — Wt 247.1 lb

## 2016-04-13 DIAGNOSIS — J84112 Idiopathic pulmonary fibrosis: Secondary | ICD-10-CM | POA: Diagnosis not present

## 2016-04-13 NOTE — Progress Notes (Signed)
Daily Session Note  Patient Details  Name: Lamorris S Mele MRN: [id number] Date of Birth: 01/28/1956 Referring Provider:    Encounter Date: 04/13/2016  Check In:     Session Check In - 04/13/16 1055    Check-In   Location [facility]   Staff Present Molly diVincenzo, MS, ACSM RCEP, Exercise Physiologist;Joan Behrens, RN, BSN;Lisa Hughes, RN   Supervising physician immediately available to respond to emergencies [facility] immediately available   Physician(s) Dr. Merrell   Medication changes reported     No   Fall or balance concerns reported    No   Warm-up and Cool-down Performed as group-led instruction   Resistance Training Performed Yes   VAD Patient? No   Pain Assessment   Currently in Pain? No/denies   Multiple Pain Sites No      Capillary Blood Glucose: No results found for this or any previous visit (from the past 24 hour(s)).     POCT Glucose - 04/13/16 1214    POCT Blood Glucose   Pre-Exercise 152 mg/dL   Post-Exercise 116 mg/dL         Exercise Prescription Changes - 04/13/16 1200    Response to Exercise   Blood Pressure (Admit) 96/60 mmHg   Blood Pressure (Exercise) 112/60 mmHg   Blood Pressure (Exit) 100/60 mmHg   Heart Rate (Admit) 52 bpm   Heart Rate (Exercise) 82 bpm   Heart Rate (Exit) 47 bpm   Oxygen Saturation (Admit) 100 %   Oxygen Saturation (Exercise) 91 %   Oxygen Saturation (Exit) 99 %   Rating of Perceived Exertion (Exercise) 11   Perceived Dyspnea (Exercise) 0   Duration Progress to 45 minutes of aerobic exercise without signs/symptoms of physical distress   Intensity THRR unchanged   Progression   Progression Continue to progress workloads to maintain intensity without signs/symptoms of physical distress.   Resistance Training   Training Prescription Yes   Weight blue bands   Reps 10-12   Interval Training   Interval Training No   Oxygen   Oxygen Continuous   Liters 8   Bike   Level 5.5   Minutes 15    NuStep   Level 6   Minutes 15   METs 2.9     Goals Met:  Independence with exercise equipment Improved SOB with ADL's Using PLB without cueing & demonstrates good technique Achieving weight loss Changing diet to healthy choices, watching portion sizes Exercise tolerated well Strength training completed today  Goals Unmet:  Not Applicable  Comments: Service time is from 1030 to 1210    Dr. Wesam G. Yacoub is Medical Director for Pulmonary Rehab at [facility]. 

## 2016-04-18 ENCOUNTER — Encounter (HOSPITAL_COMMUNITY)
Admission: RE | Admit: 2016-04-18 | Discharge: 2016-04-18 | Disposition: A | Discharge status: Home / Self Care | Payer: PPO | Source: Ambulatory Visit | Attending: Internal Medicine | Admitting: Internal Medicine

## 2016-04-18 VITALS — Wt 243.6 lb

## 2016-04-18 DIAGNOSIS — J84112 Idiopathic pulmonary fibrosis: Secondary | ICD-10-CM

## 2016-04-18 NOTE — Progress Notes (Signed)
Daily Session Note  Patient Details  Name: Jerry Montgomery MRN: [id number] Date of Birth: 09/05/1956 Referring Provider:    Encounter Date: 04/18/2016  Check In:     Session Check In - 04/18/16 1033    Check-In   Staff Present Joan Behrens, RN, BSN;Lisa Hughes, RN;Portia Payne, RN, BSN;Annedrea Stackhouse, RN, MHA;Molly diVincenzo, MS, ACSM RCEP, Exercise Physiologist   Supervising physician immediately available to respond to emergencies [facility] immediately available   Physician(s) Dr. Merrell   Medication changes reported     No   Fall or balance concerns reported    No   Resistance Training Performed Yes   VAD Patient? No   Pain Assessment   Currently in Pain? No/denies   Multiple Pain Sites No      Capillary Blood Glucose: No results found for this or any previous visit (from the past 24 hour(s)).     POCT Glucose - 04/18/16 1226    POCT Blood Glucose   Pre-Exercise 142 mg/dL   Post-Exercise 162 mg/dL         Exercise Prescription Changes - 04/18/16 1224    Exercise Review   Progression Yes   Response to Exercise   Blood Pressure (Admit) 102/58 mmHg   Blood Pressure (Exercise) 122/64 mmHg   Blood Pressure (Exit) 96/42 mmHg   Heart Rate (Admit) 56 bpm   Heart Rate (Exercise) 96 bpm   Heart Rate (Exit) 71 bpm   Oxygen Saturation (Admit) 99 %   Oxygen Saturation (Exercise) 90 %   Oxygen Saturation (Exit) 98 %   Rating of Perceived Exertion (Exercise) 13   Perceived Dyspnea (Exercise) 0   Duration Progress to 45 minutes of aerobic exercise without signs/symptoms of physical distress   Intensity THRR unchanged   Progression   Progression Continue to progress workloads to maintain intensity without signs/symptoms of physical distress.   Resistance Training   Training Prescription Yes   Weight blue bands   Reps 10-12   Interval Training   Interval Training No   Oxygen   Oxygen Continuous   Liters 8   Bike   Level 6   Minutes 17   NuStep   Level 6   Minutes 17   METs 3.2   Track   Laps 14   Minutes 17     Goals Met:  Independence with exercise equipment Improved SOB with ADL's Using PLB without cueing & demonstrates good technique Changing diet to healthy choices, watching portion sizes Exercise tolerated well No report of cardiac concerns or symptoms Strength training completed today  Goals Unmet:  Not Applicable  Comments: Service time is from 1030 to 1210   Dr. Wesam G. Yacoub is Medical Director for Pulmonary Rehab at [facility]. 

## 2016-04-19 ENCOUNTER — Ambulatory Visit (INDEPENDENT_AMBULATORY_CARE_PROVIDER_SITE_OTHER): Payer: PPO | Admitting: Internal Medicine

## 2016-04-19 DIAGNOSIS — J84112 Idiopathic pulmonary fibrosis: Secondary | ICD-10-CM | POA: Diagnosis not present

## 2016-04-19 LAB — PULMONARY FUNCTION TEST
DL/VA % PRED: 59 %
DL/VA: 2.88 ml/min/mmHg/L
DLCO UNC % PRED: 37 %
DLCO UNC: 14.05 ml/min/mmHg
DLCO cor % pred: 36 %
DLCO cor: 13.75 ml/min/mmHg
FEF 25-75 PRE: 1.82 L/s
FEF 25-75 Post: 2.13 L/sec
FEF2575-%CHANGE-POST: 17 %
FEF2575-%PRED-POST: 65 %
FEF2575-%PRED-PRE: 56 %
FEV1-%CHANGE-POST: 2 %
FEV1-%PRED-POST: 64 %
FEV1-%PRED-PRE: 62 %
FEV1-PRE: 2.55 L
FEV1-Post: 2.62 L
FEV1FVC-%Change-Post: 4 %
FEV1FVC-%Pred-Pre: 99 %
FEV6-%CHANGE-POST: -1 %
FEV6-%PRED-POST: 64 %
FEV6-%PRED-PRE: 66 %
FEV6-PRE: 3.4 L
FEV6-Post: 3.33 L
FEV6FVC-%CHANGE-POST: 0 %
FEV6FVC-%PRED-PRE: 104 %
FEV6FVC-%Pred-Post: 104 %
FVC-%CHANGE-POST: -1 %
FVC-%Pred-Post: 62 %
FVC-%Pred-Pre: 63 %
FVC-Post: 3.37 L
FVC-Pre: 3.42 L
POST FEV6/FVC RATIO: 99 %
Post FEV1/FVC ratio: 78 %
Pre FEV1/FVC ratio: 75 %
Pre FEV6/FVC Ratio: 99 %
RV % PRED: 54 %
RV: 1.38 L
TLC % pred: 61 %
TLC: 4.78 L

## 2016-04-19 NOTE — Progress Notes (Signed)
PFT done today. 

## 2016-04-20 ENCOUNTER — Encounter (HOSPITAL_COMMUNITY)
Admission: RE | Admit: 2016-04-20 | Discharge: 2016-04-20 | Disposition: A | Discharge status: Home / Self Care | Payer: PPO | Source: Ambulatory Visit | Attending: Internal Medicine | Admitting: Internal Medicine

## 2016-04-20 DIAGNOSIS — J84112 Idiopathic pulmonary fibrosis: Secondary | ICD-10-CM

## 2016-04-21 ENCOUNTER — Other Ambulatory Visit: Payer: Self-pay | Admitting: Family Medicine

## 2016-04-25 ENCOUNTER — Encounter (HOSPITAL_COMMUNITY): Payer: PPO

## 2016-04-26 ENCOUNTER — Other Ambulatory Visit: Payer: Self-pay | Admitting: Family Medicine

## 2016-04-26 MED ORDER — METOPROLOL SUCCINATE ER 200 MG PO TB24: 200.0000 mg | ORAL_TABLET | Freq: Every day | ORAL | Status: DC

## 2016-04-26 NOTE — Progress Notes (Signed)
IPF Registry  Informed Consent   Subject Name: Charlotta NewtonRobert S Rupert  This patient, Charlotta NewtonRobert S Beavin, has been consented to the above clinical trial according to FDA regulations, GCP guidelines and [employer] SOPs. The informed consent form and study design have been explained to this patient by this study coordinator at  on . The patient demonstrated comprehension of this clinical trial and study requirements/expectations. No study procedures have been initiated before consenting of this patient. The patient was given sufficient time for reading the consent form. All risks, benefits and options have been thoroughly discussed and all questions were answered per the patient's satisfaction. This patient was not coerced in any way to participate in this clinical trial. This patient has voluntarily signed consent version approved date of 28Aug2017 on 6Mar2017 at 1115 . A copy of the signed consent form was given to the patient and a copy was placed in the subjects medical record. Subject was thanked for their participation in research and contribution to science.  SIGNATURE Office - [phone] Cell/pager

## 2016-04-26 NOTE — Telephone Encounter (Signed)
Medication called/sent to requested pharmacy  

## 2016-04-27 ENCOUNTER — Encounter (HOSPITAL_COMMUNITY): Payer: Self-pay | Admitting: *Deleted

## 2016-04-28 ENCOUNTER — Telehealth: Payer: Self-pay | Admitting: Internal Medicine

## 2016-04-28 NOTE — Telephone Encounter (Addendum)
-----   Message from Tamela Oddiarolyn W Hedrick sent at 04/28/2016  9:08 AM EDT ----- Regarding: FW: Charlotta NewtonPhipps, Quentin S.   ----- Message -----    From: Tamela Oddiarolyn W Hedrick    Sent: 04/26/2016   2:25 PM      To: Kalman ShanMurali Ramaswamy, MD Subject: Charlotta NewtonPhipps, Caedan S.                              Spoke with subject on the phone regarding the need to resign consent for the IPF Pro study due to page 13 inadvertently being signed.  This page states, " Notice of Revocation of Authorization...".  The subject is more than happy to come by the office on 6Jul2017 and sign ICF again.  He will call and confirm this intent either today or tomorrow morning.  The subject did express understanding that signing page 13 of Revocation was done in error and is very willing to continue on with his participation with the IPF Registry Pro study.  It was not his intent to remove himself from the study.

## 2016-05-03 ENCOUNTER — Encounter (HOSPITAL_COMMUNITY): Payer: Self-pay | Admitting: *Deleted

## 2016-05-03 NOTE — Telephone Encounter (Signed)
D/w Jerry Montgomery 07/12/1956 about   A) FOR RESEARCH TEAM I  I called Charlotta NewtonRobert S Germani and d/w him 4:29 PM 05/03/2016 about IPF registry study -> that needs to do re-consent assuming he is still interested in participation in IPF registry study on his own free voluntary will. HE said that is absolutely interested. He recognized that is administrative error signing revocation of consent and willing to re-do the paper work. However, he lives in another town and does not feel need to come just to sign the consent. He says that his wife has been sick  As well. However, when he comes into town he will definitely call (and this is likely sometime next few weeks) and will drop by to re-consent. Apologized for the inconvenience and administrative error of study staff  B)For Morrie Sheldonshley (clinical team):  IPF routine standard of care - last seen early may 2017. Says since then has lost intentionally 18# as goal of getting a transplant. PFT June 2017 stable x 1 year and I gave him that result. Noticed no fu appt pending. Please give him followup between last week in July - through 06/01/16. Preferred date is July 27th Thursday. ( Next 48h you will see me opening schedule for last week of July). If he cannot come July 27th thurday then he can see Tammy Parrett on or before 05/29/16. Please let me know date of visit.   Thanks   Dr. Kalman ShanMurali Gaylon Melchor, M.D., [facility] CenterF.C.C.P Pulmonary and Critical Care Medicine Staff Physician [facility]  Pulmonary and Critical Care Pager: [phone], If no answer or between  15:00h - 7:00h: call [phone]  05/03/2016 4:28 PM

## 2016-05-04 ENCOUNTER — Telehealth: Payer: Self-pay | Admitting: Internal Medicine

## 2016-05-04 NOTE — Telephone Encounter (Signed)
LMTCB for pt 

## 2016-05-04 NOTE — Telephone Encounter (Signed)
I have opened clinic slots from 05/12/16 through 05/18/16 - I have more flexiblity now. He can come during that for his 3 month regular visit though because hias last visit was 02/25/16 the 3 months is first week of august so my ideal preferrence is 05/18/16  Or see APP - TAmmy - by 06/01/16. Let me know date  THanks  Dr. Kalman ShanMurali Jacoby Zanni, M.D., [facility] Pulmonary and Critical Care Medicine Staff Physician [facility] Pager: [phone], If no answer or between  15:00h - 7:00h: call [phone]  05/04/2016 4:49 PM

## 2016-05-04 NOTE — Telephone Encounter (Signed)
Tommie SamsMindy S Silva, CMA at 05/04/2016 1:32 PM     Status: Signed       Expand All Collapse All   LMTCB for pt

## 2016-05-05 ENCOUNTER — Encounter (INDEPENDENT_AMBULATORY_CARE_PROVIDER_SITE_OTHER): Payer: PPO | Admitting: Internal Medicine

## 2016-05-05 DIAGNOSIS — Z006 Encounter for examination for normal comparison and control in clinical research program: Secondary | ICD-10-CM

## 2016-05-05 DIAGNOSIS — J84112 Idiopathic pulmonary fibrosis: Secondary | ICD-10-CM

## 2016-05-05 NOTE — Telephone Encounter (Signed)
lmtcb X2 for pt to call back- pt just needs to be schedule for ROV with MR next week if available.

## 2016-05-05 NOTE — Progress Notes (Signed)
      IPF-PRO Registry Informed Consent   Subject Name: Charlotta NewtonRobert S Hallquist  This patient, Charlotta NewtonRobert S Weidner, returned today to be re-consented for [facility]. This patient has re- consented to the above clinical trial according to FDA regulations, GCP guidelines and [facility]'s SOPs. The reason for re-consent was explained to the patient by Dr. Kalman ShanMurali Ramaswamy on 05/03/2016 via a telephone call as stated below:  Kalman ShanMurali Ramaswamy, MD at 05/03/2016 4:22 PM     Status: Signed       Expand All Collapse All   D/w Les Pouobert S Losurdo 02/21/1956 about   A) FOR RESEARCH TEAM I  I called Charlotta NewtonRobert S Chipley and d/w him 4:29 PM 05/03/2016 about IPF registry study -> that needs to do re-consent assuming he is still interested in participation in IPF registry study on his own free voluntary will. HE said that is absolutely interested. He recognized that is administrative error signing revocation of consent and willing to re-do the paper work. However, he lives in another town and does not feel need to come just to sign the consent. He says that his wife has been sick As well. However, when he comes into town he will definitely call (and this is likely sometime next few weeks) and will drop by to re-consent. Apologized for the inconvenience and administrative error of study staff        The informed consent form and study design were again reviewed with this patient by this study coordinator at 1347 on 05/05/2016. The patient again demonstrated comprehension of this clinical trial and study requirements/expectations. The patient was given sufficient time for re-reading the consent form. All risks, benefits and options have been thoroughly discussed again and all questions were answered per the patient's satisfaction. This patient was not coerced in any way to participate in this clinical trial. This patient has voluntarily re-signed consent version [id number] at 1357 on 05/05/2016. A copy of the signed  consent form was given to the patient (this includes page 13)  and a copy was placed in the subject's medical record. Subject was thanked for their participation in research and contribution to science.  Carron CurieJennifer Ainara Eldridge, CMA, Research Assistant Office [phone] Cell/pager

## 2016-05-08 NOTE — Telephone Encounter (Signed)
See other telephone encounter in chart- pt requested an appt in September, so this was scheduled at his request.

## 2016-05-08 NOTE — Telephone Encounter (Signed)
rov scheduled in September at pt's request.  Nothing further needed.

## 2016-05-09 DIAGNOSIS — J438 Other emphysema: Secondary | ICD-10-CM | POA: Diagnosis not present

## 2016-05-09 DIAGNOSIS — J449 Chronic obstructive pulmonary disease, unspecified: Secondary | ICD-10-CM | POA: Diagnosis not present

## 2016-05-09 DIAGNOSIS — R269 Unspecified abnormalities of gait and mobility: Secondary | ICD-10-CM | POA: Diagnosis not present

## 2016-05-09 DIAGNOSIS — J8410 Pulmonary fibrosis, unspecified: Secondary | ICD-10-CM | POA: Diagnosis not present

## 2016-05-24 ENCOUNTER — Emergency Department (HOSPITAL_COMMUNITY)
Admission: EM | Admit: 2016-05-24 | Discharge: 2016-05-24 | Disposition: A | Discharge status: Home / Self Care | Payer: PPO | Attending: Emergency Medicine | Admitting: Emergency Medicine

## 2016-05-24 ENCOUNTER — Encounter (HOSPITAL_COMMUNITY): Payer: Self-pay | Admitting: Emergency Medicine

## 2016-05-24 DIAGNOSIS — Z7982 Long term (current) use of aspirin: Secondary | ICD-10-CM | POA: Diagnosis not present

## 2016-05-24 DIAGNOSIS — E11649 Type 2 diabetes mellitus with hypoglycemia without coma: Secondary | ICD-10-CM | POA: Diagnosis not present

## 2016-05-24 DIAGNOSIS — R404 Transient alteration of awareness: Secondary | ICD-10-CM | POA: Diagnosis not present

## 2016-05-24 DIAGNOSIS — Z794 Long term (current) use of insulin: Secondary | ICD-10-CM | POA: Insufficient documentation

## 2016-05-24 DIAGNOSIS — N189 Chronic kidney disease, unspecified: Secondary | ICD-10-CM | POA: Diagnosis not present

## 2016-05-24 DIAGNOSIS — E039 Hypothyroidism, unspecified: Secondary | ICD-10-CM | POA: Insufficient documentation

## 2016-05-24 DIAGNOSIS — Z87891 Personal history of nicotine dependence: Secondary | ICD-10-CM | POA: Insufficient documentation

## 2016-05-24 DIAGNOSIS — I5030 Unspecified diastolic (congestive) heart failure: Secondary | ICD-10-CM | POA: Insufficient documentation

## 2016-05-24 DIAGNOSIS — I130 Hypertensive heart and chronic kidney disease with heart failure and stage 1 through stage 4 chronic kidney disease, or unspecified chronic kidney disease: Secondary | ICD-10-CM | POA: Insufficient documentation

## 2016-05-24 DIAGNOSIS — I2510 Atherosclerotic heart disease of native coronary artery without angina pectoris: Secondary | ICD-10-CM | POA: Insufficient documentation

## 2016-05-24 DIAGNOSIS — J449 Chronic obstructive pulmonary disease, unspecified: Secondary | ICD-10-CM | POA: Diagnosis not present

## 2016-05-24 DIAGNOSIS — Z7902 Long term (current) use of antithrombotics/antiplatelets: Secondary | ICD-10-CM | POA: Insufficient documentation

## 2016-05-24 DIAGNOSIS — E1122 Type 2 diabetes mellitus with diabetic chronic kidney disease: Secondary | ICD-10-CM | POA: Diagnosis not present

## 2016-05-24 DIAGNOSIS — Z955 Presence of coronary angioplasty implant and graft: Secondary | ICD-10-CM | POA: Insufficient documentation

## 2016-05-24 DIAGNOSIS — Z7984 Long term (current) use of oral hypoglycemic drugs: Secondary | ICD-10-CM | POA: Diagnosis not present

## 2016-05-24 DIAGNOSIS — E162 Hypoglycemia, unspecified: Secondary | ICD-10-CM | POA: Diagnosis not present

## 2016-05-24 LAB — COMPREHENSIVE METABOLIC PANEL
ALBUMIN: 3.5 g/dL (ref 3.5–5.0)
ALK PHOS: 47 U/L (ref 38–126)
ALT: 25 U/L (ref 17–63)
ANION GAP: 10 (ref 5–15)
AST: 24 U/L (ref 15–41)
BUN: 17 mg/dL (ref 6–20)
CALCIUM: 9.4 mg/dL (ref 8.9–10.3)
CHLORIDE: 104 mmol/L (ref 101–111)
CO2: 25 mmol/L (ref 22–32)
Creatinine, Ser: 2.47 mg/dL — ABNORMAL HIGH (ref 0.61–1.24)
GFR calc non Af Amer: 26 mL/min — ABNORMAL LOW (ref >60)
GFR, EST AFRICAN AMERICAN: 31 mL/min — AB (ref >60)
GLUCOSE: 41 mg/dL — AB (ref 65–99)
POTASSIUM: 3.6 mmol/L (ref 3.5–5.1)
Sodium: 139 mmol/L (ref 135–145)
Total Bilirubin: 0.7 mg/dL (ref 0.3–1.2)
Total Protein: 6.1 g/dL — ABNORMAL LOW (ref 6.5–8.1)

## 2016-05-24 LAB — CBG MONITORING, ED
GLUCOSE-CAPILLARY: 156 mg/dL — AB (ref 65–99)
GLUCOSE-CAPILLARY: 146 mg/dL — AB (ref 65–99)
Glucose-Capillary: 128 mg/dL — ABNORMAL HIGH (ref 65–99)
Glucose-Capillary: 29 mg/dL — CL (ref 65–99)
Glucose-Capillary: 178 mg/dL — ABNORMAL HIGH (ref 65–99)

## 2016-05-24 LAB — CBC
HEMATOCRIT: 41.4 % (ref 39.0–52.0)
HEMOGLOBIN: 13.8 g/dL (ref 13.0–17.0)
MCH: 28.9 pg (ref 26.0–34.0)
MCHC: 33.3 g/dL (ref 30.0–36.0)
MCV: 86.8 fL (ref 78.0–100.0)
Platelets: 160 10*3/uL (ref 150–400)
RBC: 4.77 MIL/uL (ref 4.22–5.81)
RDW: 14 % (ref 11.5–15.5)
WBC: 8.9 10*3/uL (ref 4.0–10.5)

## 2016-05-24 MED ORDER — DEXTROSE 50 % IV SOLN
INTRAVENOUS | Status: DC
Start: 2016-05-24 — End: 2016-05-24
  Filled 2016-05-24: qty 50

## 2016-05-24 MED ORDER — DEXTROSE 10 % IV SOLN: INTRAVENOUS | Status: DC

## 2016-05-24 MED ORDER — DEXTROSE 50 % IV SOLN
1.0000 | Freq: Once | INTRAVENOUS | Status: AC
  Administered 2016-05-24: 50 mL via INTRAVENOUS

## 2016-05-24 NOTE — ED Notes (Signed)
Advised Pt of urine needed. Pt stated she will try in 5-10 mins.

## 2016-05-24 NOTE — ED Notes (Signed)
Pt's daughter just arrived, states the pt told his wife he needed 80U of insulin when his CBG was 74 this morning.

## 2016-05-24 NOTE — ED Triage Notes (Signed)
Pt in from home after episode of hypoglycemia and unconsciousness. Per EMS report, pt was found with CBG of 17 and LOC at home. EMS gave 1.5 amps D50, CBG was 126 re-check. Per report, pt usually takes 60U of insulin q day and stated that wife (who has dementia) could have given 80U without food by mistake. Pt also reporting that his dog and close friend died recently, is very sad. States "life is just too much to handle, it'd be easier to not be here". Pt denies giving himself extra insulin this morning. Pt denies having plan to harm himself.

## 2016-05-24 NOTE — ED Notes (Signed)
Spoke with MD knapp advised patient CBG going up. States okay to hold D10 as long as CBG increasing.

## 2016-05-24 NOTE — ED Provider Notes (Signed)
[facility] DEPT Provider Note   CSN: [id number] Arrival date & time: 05/24/16  1249  First Provider Contact:  First MD Initiated Contact with Patient 05/24/16 1325        History   Chief Complaint Chief Complaint  Patient presents with  . Hypoglycemia  . Loss of Consciousness    HPI Jerry Montgomery is a 60 y.o. male.  HPI Patient presents to the emergency room with hypoglycemia. The history is provided by the patient and his daughter. The patient does not recall checking his blood sugar earlier or getting any insulin. Patient's daughter spoke with her mother who stated that the patient's blood sugar was 87 this morning. She then gave the patient 80 units of regular insulin. The daughter asked her mother why she would given him so much insulin when his sugar was 58 and she answered will she thought that it would help to bring his blood sugar up. He also said that the patient told her to give him that insulin. Patient does not remember this and denies asking for the insulin to be administered.  The daughter states that her mother seems to have the early stages of dementia.  She does not think that her mother will try to harm her father. The patient also has been depressed recently because his dog passed away however he denies any intent to harm himself. He denies feeling suicidal. Past Medical History:  Diagnosis Date  . Acute diastolic heart failure ([place])   . Atrial fibrillation ([place])   . Atrial tachycardia ([place])   . CAD (coronary artery disease)   . COPD (chronic obstructive pulmonary disease) ([place])   . Hyperlipidemia   . Hypertension   . Hypothyroidism   . On home oxygen therapy    "3L; 24/7" (04/15/2015)  . Pneumothorax on right 4/11  . Postinflammatory pulmonary fibrosis ([place])   . Sleep apnea    "won't use CPAP" (04/14/2105)  . SVT (supraventricular tachycardia) ([place])    Per Dr. Tanna Furry note  . Type II diabetes mellitus ([place])   . WPW (Wolff-Parkinson-White syndrome)    Per Dr. Tanna Furry Note    Patient Active Problem List   Diagnosis Date Noted  . Encounter for therapeutic drug monitoring 09/14/2015  . Other fatigue 09/14/2015  . Pulmonary fibrosis ([place])   . 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 Chronic respiratory failure with hypoxia ([place]) 03/09/2015  . ILD (interstitial lung disease) ([place]) 03/09/2015  . Acute renal insufficiency 03/09/2015  . Chronic renal insufficiency 03/09/2015  . Other emphysema ([place]) 03/09/2015  . Diabetes mellitus type 2 without retinopathy ([place]) 02/05/2015  . Acute respiratory failure ([place])   . Atrial fibrillation with RVR ([place])   . Acute renal failure syndrome ([place])   . Acute pulmonary edema ([facility])   . Acute heart failure ([place])   . Acute diastolic heart failure ([place])   . Acute respiratory failure with hypoxia ([place])   . Atrial tachycardia ([place])   . Hypokalemia   . 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 SVT (supraventricular tachycardia) ([place]) 02/02/2015  . Chest wall pain 06/17/2013  . Chondral lesion 01/22/2013  . Cough 03/22/2012  . Pneumonia, organism unspecified 03/07/2012  . DIABETES MELLITUS, TYPE II 02/23/2010  . COPD II 02/23/2010  . HYPERLIPIDEMIA 02/22/2010  . HYPERTENSION 02/22/2010  . CAD 02/22/2010  . BRONCHIECTASIS 02/22/2010  . PULMONARY FIBROSIS 02/22/2010    Past Surgical History:  Procedure Laterality Date  . BILATERAL VATS ABLATION Right   . CARDIAC CATHETERIZATION  05/03/2007   patent stents  . CARDIAC CATHETERIZATION N/A 05/18/2015   Procedure: Right Heart Cath;  Surgeon: Belva Crome, MD;  Location: [facility];  Service: Cardiovascular;  Laterality: N/A;  . CORONARY ANGIOPLASTY WITH STENT PLACEMENT  03/05/2003; 2008; 01/13/2010   PCI & Stent  LAD & mid CX; stent to CX  (I've got 4 stents total" (04/15/2015)  . ELECTROPHYSIOLOGIC STUDY N/A 04/14/2015   Procedure: SVT Ablation;  Surgeon: Evans Lance, MD;  Location: [facility];  Service: Cardiovascular;  Laterality: N/A;  . KNEE  ARTHROSCOPY Right 01/22/2013   Procedure: RIGHT ARTHROSCOPY KNEE WITH DEBRIDEMENT, abrasion chondroplasty of lateral tibial plateau, debridement of partial tear of ACL, menisectomy;  Surgeon: Tobi Bastos, MD;  Location: [facility];  Service: Orthopedics;  Laterality: Right;  . KNEE SURGERY  2012   "reattached quad"  . LUNG LOBECTOMY Right   . SHOULDER ARTHROSCOPY W/ ROTATOR CUFF REPAIR Bilateral   . VIDEO ASSISTED THORACOSCOPY Left 07/19/2015   Procedure: LEFT VIDEO ASSISTED THORACOSCOPY WITH LEFT UPPER AND LOWER LUNG BIOPSY;  Surgeon: Melrose Nakayama, MD;  Location: [facility];  Service: Thoracic;  Laterality: Left;  Marland Kitchen VIDEO BRONCHOSCOPY N/A 07/19/2015   Procedure: VIDEO BRONCHOSCOPY WITH FLUORO;  Surgeon: Melrose Nakayama, MD;  Location: [facility];  Service: Thoracic;  Laterality: N/A;       Home Medications    Prior to Admission medications   Medication Sig Start Date End Date Taking? Authorizing Provider  ADVAIR HFA 115-21 MCG/ACT inhaler Inhale 2 puffs into the lungs 2 (two) times daily. Reported on 02/25/2016 08/03/13  Yes Historical Provider, MD  albuterol (PROVENTIL HFA;VENTOLIN HFA) 108 (90 Base) MCG/ACT inhaler Inhale 2 puffs into the lungs every 6 (six) hours as needed for wheezing. 03/07/16  Yes [name], MD  ALPRAZolam Duanne Moron) 0.5 MG tablet Take 1 tablet (0.5 mg total) by mouth 3 (three) times daily as needed for anxiety. 06/21/15  Yes [name], MD  aspirin 81 MG tablet Take 81 mg by mouth every morning.    Yes Historical Provider, MD  atorvastatin (LIPITOR) 40 MG tablet Take 1 tablet by mouth  every night at bedtime 11/16/15  Yes [name], MD  blood glucose meter kit and supplies KIT Dispense based on patient and insurance preference. Use up to four times daily as directed. (FOR ICD E11.65) 11/24/15  Yes [name], MD  Blood Glucose Monitoring Suppl (ONE TOUCH ULTRA SYSTEM KIT) w/Device KIT Use to monitor FSBS 4x daily for fluctuating blood sugars. Dx:  E11.65. 01/04/16  Yes Alycia Rossetti, MD  clopidogrel (PLAVIX) 75 MG tablet Take 1 tablet by mouth  every day 04/24/16  Yes [name], MD  diltiazem (CARDIZEM) 30 MG tablet Take 1 tablet (30 mg total) by mouth every 6 (six) hours. 12/31/15  Yes Evans Lance, MD  furosemide (LASIX) 20 MG tablet Take 1 tablet (20 mg total) by mouth daily as needed for fluid (fluid). Patient taking differently: Take 20 mg by mouth daily.  11/18/15  Yes [name], MD  glucose blood test strip Use to monitor FSBS 4x daily for fluctuating blood sugars. Dx: E11.65. 12/24/15  Yes [name], MD  insulin aspart (NOVOLOG) 100 UNIT/ML injection 10 UNITS WITH BREAKFAST AND 10 UNITS WITH LUNCH, 30 UNITS WITH SUPPER Patient taking differently: Inject 10-30 Units into the skin See admin instructions. 10 UNITS WITH BREAKFAST AND 10 UNITS WITH LUNCH, 30 UNITS WITH  SUPPER 01/06/16  Yes [name], MD  insulin glargine (LANTUS) 100 UNIT/ML injection Inject 0.6 mLs (60 Units total) into the skin at bedtime. 03/07/16  Yes [name], MD  isosorbide mononitrate (IMDUR) 60 MG 24 hr tablet Take 60 mg by mouth daily. 05/19/16  Yes Historical Provider, MD  levothyroxine (SYNTHROID, LEVOTHROID) 125 MCG tablet Take 1 tablet (125 mcg total) by mouth daily. 11/03/15  Yes [name], MD  Menthol, Topical Analgesic, (BIOFREEZE EX) Apply 1 application topically as needed (for pain).   Yes Historical Provider, MD  metFORMIN (GLUCOPHAGE) 850 MG tablet Take 1 tablet by mouth  twice a day 02/02/16  Yes [name], MD  metoprolol (TOPROL XL) 200 MG 24 hr tablet Take 1 tablet (200 mg total) by mouth daily. 04/26/16  Yes [name], MD  nitroGLYCERIN (NITROSTAT) 0.4 MG SL tablet Place 1 tablet (0.4 mg total) under the tongue every 5 (five) minutes as needed for chest pain. 03/11/15  Yes Evans Lance, MD  SYRINGE/NEEDLE, DISP, 1 ML (B-D SYRINGE/NEEDLE 1CC/25GX5/8) 25G X 5/8" 1 ML MISC As directed 01/06/16  Yes [name], MD  tiotropium (SPIRIVA) 18 MCG inhalation capsule Place 18 mcg into inhaler and inhale daily. Reported on 02/25/2016 07/29/14  Yes Historical Provider, MD  zolpidem (AMBIEN) 10 MG tablet Take 1 tablet (10 mg total) by mouth at bedtime. 04/07/16  Yes [name], MD    Family History Family History  Problem Relation Age of Onset  . Breast cancer Mother   . Diabetes type II Mother   . Lung cancer Father     Social History Social History  Substance Use Topics  . Smoking status: Former Smoker    Packs/day: 1.50    Years: 35.00    Types: Cigarettes    Quit date: 10/24/2007  . Smokeless tobacco: Never Used  . Alcohol use 0.0 oz/week     Comment: 04/15/2015 "I've had a couple drinks in the last 8 months"     Allergies   Niaspan [niacin]; Ofev [nintedanib]; Pirfenidone; and Altace [ramipril]   Review of Systems Review of Systems  All other systems reviewed and are negative.    Physical Exam Updated Vital Signs BP 112/67   Pulse 64   Temp 98.2 F (36.8 C) (Oral)   Resp 13   SpO2 98%   Physical Exam  Constitutional: He appears well-developed and well-nourished. No distress.  HENT:  Head: Normocephalic and atraumatic.  Right Ear: External ear normal.  Left Ear: External ear normal.  Eyes: Conjunctivae are normal. Right eye exhibits no discharge. Left eye exhibits no discharge. No scleral icterus.  Neck: Neck supple. No tracheal deviation present.  Cardiovascular: Normal rate, regular rhythm and intact distal pulses.   Pulmonary/Chest: Effort normal and breath sounds normal. No stridor. No respiratory distress. He has no wheezes. He has no rales.  Abdominal: Soft. Bowel sounds are normal. He exhibits no distension. There is no tenderness. There is no rebound and no guarding.  Musculoskeletal: He exhibits no edema or tenderness.  Neurological: He is alert. He has normal strength. No cranial nerve deficit (no facial droop, extraocular movements intact, no slurred  speech) or sensory deficit. He exhibits normal muscle tone. He displays no seizure activity. Coordination normal.  Speech slow but no dysarthria, follows commands  Skin: Skin is warm and dry. No rash noted.  Psychiatric: He has a normal mood and affect.  Nursing note and vitals reviewed.  ED Treatments / Results  Labs (all labs ordered are listed, but only abnormal results are displayed) Labs Reviewed  COMPREHENSIVE METABOLIC PANEL - Abnormal; Notable for the following:       Result Value   Glucose, Bld 41 (*)    Creatinine, Ser 2.47 (*)    Total Protein 6.1 (*)    GFR calc non Af Amer 26 (*)    GFR calc Af Amer 31 (*)    All other components within normal limits  CBG MONITORING, ED - Abnormal; Notable for the following:    Glucose-Capillary 29 (*)    All other components within normal limits  CBG MONITORING, ED - Abnormal; Notable for the following:    Glucose-Capillary 128 (*)    All other components within normal limits  CBG MONITORING, ED - Abnormal; Notable for the following:    Glucose-Capillary 156 (*)    All other components within normal limits  CBG MONITORING, ED - Abnormal; Notable for the following:    Glucose-Capillary 178 (*)    All other components within normal limits  CBG MONITORING, ED - Abnormal; Notable for the following:    Glucose-Capillary 146 (*)    All other components within normal limits  CBC  URINALYSIS, ROUTINE W REFLEX MICROSCOPIC (NOT AT [facility])     Procedures Procedures (including critical care time)  Medications Ordered in ED Medications  dextrose 50 % solution (not administered)  dextrose 10 % infusion (not administered)  dextrose 50 % solution 50 mL (50 mLs Intravenous Given 05/24/16 1336)     Initial Impression / Assessment and Plan / ED Course  I have reviewed the triage vital signs and the nursing notes.  Pertinent labs & imaging results that were available during my care of the patient were reviewed by me and considered in my  medical decision making (see chart for details).  Clinical Course  Value Comment By Time   Patient was given an amp and a half of D50 by EMS. His blood sugars back down to 29 here in the emergency room. Additional dose of D50 has been ordered Dorie Rank, MD 08/02 1341  Glucose-Capillary: Marland Kitchen 29 Critical low Dorie Rank, MD 08/02 1348   Mental status remains stable.   Glucose has been increasing.  Dorie Rank, MD 08/02 1521    Patient was monitored in the emergency room for several hours. Blood sugar has stabilized into the mid 150s.  Patient was accidentally given short time NovoLog sliding think he should be stable for discharge at this point.  Discussed with the patient as well as his daughter about the possibility of this being an intentional overdose. Patient denies any suicidal or homicidal ideation. He admits that he has been somewhat depressed but would never have any intention of harming himself. Daughter feels comfortable with this and does not feel that her father is in danger of harming himself.  Plan on discharge home. Patient will hold his insulin today. Follow-up with his primary doctor.  Final Clinical Impressions(s) / ED Diagnoses   Final diagnoses:  Hypoglycemia      Dorie Rank, MD 05/24/16 [phone]

## 2016-05-24 NOTE — ED Notes (Signed)
Patient does not have home o2. Advised have family go home a get a tank. Patient and family denied advised they would be fine.

## 2016-05-24 NOTE — ED Notes (Signed)
CBG: 29 RN notified

## 2016-05-24 NOTE — ED Notes (Signed)
Patient given OJ patient alert and orinted.

## 2016-05-26 NOTE — Addendum Note (Signed)
Encounter addended by: Jacques Earthly, RD on: 05/26/2016 10:35 AM<BR>    Actions taken: Flowsheet data copied forward, Visit Navigator Flowsheet section accepted

## 2016-06-03 ENCOUNTER — Other Ambulatory Visit: Payer: Self-pay | Admitting: Family Medicine

## 2016-06-09 DIAGNOSIS — J438 Other emphysema: Secondary | ICD-10-CM | POA: Diagnosis not present

## 2016-06-09 DIAGNOSIS — J8410 Pulmonary fibrosis, unspecified: Secondary | ICD-10-CM | POA: Diagnosis not present

## 2016-06-09 DIAGNOSIS — J449 Chronic obstructive pulmonary disease, unspecified: Secondary | ICD-10-CM | POA: Diagnosis not present

## 2016-06-09 DIAGNOSIS — R269 Unspecified abnormalities of gait and mobility: Secondary | ICD-10-CM | POA: Diagnosis not present

## 2016-06-13 NOTE — Progress Notes (Signed)
Discharge Summary  Patient Details  Name: Jerry Montgomery MRN: [id number] Date of Birth: January 08, 1956 Referring Provider:     Number of Visits: 26  Reason for Discharge:  Patient reached a stable level of exercise. Patient independent in their exercise.  Smoking History:  History  Smoking Status  . Former Smoker  . Packs/day: 1.50  . Years: 35.00  . Types: Cigarettes  . Quit date: 10/24/2007  Smokeless Tobacco  . Never Used    Diagnosis:  IPF (idiopathic pulmonary fibrosis) ([place])  ADL [facility]SD:     Pulmonary Assessment Scores    Row Name 01/11/16 1412 04/27/16 1401       ADL [facility]SD   ADL Phase  - Exit    SOB Score total 31 64       Initial Exercise Prescription:     Initial Exercise Prescription - 01/11/16 1600      Date of Initial Exercise RX and Referring Provider   Date 01/11/16     Oxygen   Oxygen Continuous   Liters 3     Bike   Level 1.2   Minutes 15     NuStep   Level 1   Minutes 15   METs 2.4     Track   Laps 8   Minutes 15     Prescription Details   Frequency (times per week) 2   Duration Progress to 45 minutes of aerobic exercise without signs/symptoms of physical distress     Intensity   THRR 40-80% of Max Heartrate 63-126   Ratings of Perceived Exertion 11-13   Perceived Dyspnea 0-4     Resistance Training   Training Prescription Yes   Weight orange bands   Reps 10-12      Discharge Exercise Prescription (Final Exercise Prescription Changes):     Exercise Prescription Changes - 04/18/16 1224      Exercise Review   Progression Yes     Response to Exercise   Blood Pressure (Admit) 102/58   Blood Pressure (Exercise) 122/64   Blood Pressure (Exit) 96/42   Heart Rate (Admit) 56 bpm   Heart Rate (Exercise) 96 bpm   Heart Rate (Exit) 71 bpm   Oxygen Saturation (Admit) 99 %   Oxygen Saturation (Exercise) 90 %   Oxygen Saturation (Exit) 98 %   Rating of Perceived Exertion (Exercise) 13   Perceived Dyspnea (Exercise) 0    Duration Progress to 45 minutes of aerobic exercise without signs/symptoms of physical distress   Intensity THRR unchanged     Progression   Progression Continue to progress workloads to maintain intensity without signs/symptoms of physical distress.     Resistance Training   Training Prescription Yes   Weight blue bands   Reps 10-12     Interval Training   Interval Training No     Oxygen   Oxygen Continuous   Liters 8     Bike   Level 6   Minutes 17     NuStep   Level 6   Minutes 17   METs 3.2     Track   Laps 14   Minutes 17      Functional Capacity:     6 Minute Walk    Row Name 01/11/16 1634 04/20/16 1701       6 Minute Walk   Phase Initial Discharge    Distance 1072 feet 1274 feet    Walk Time 4.48 minutes 6 minutes    # of Rest Breaks 1  0    MPH 2.72 2.41    METS 2.87 2.84    RPE 11 11    Perceived Dyspnea  3 1    VO2 Peak 10.04  -    Symptoms No No    Resting HR 77 bpm 48 bpm    Resting BP 116/76 132/80    Max Ex. HR 120 bpm 110 bpm    Max Ex. BP 140/78 160/72    2 Minute Post BP 142/82 132/78      Interval HR   Baseline HR 77 48    1 Minute HR 91 78    2 Minute HR 120 87    3 Minute HR 105 95    4 Minute HR 105 103    5 Minute HR 101 97    6 Minute HR 100 110    2 Minute Post HR 90 61    Interval Heart Rate? Yes Yes      Interval Oxygen   Interval Oxygen? Yes Yes    Baseline Oxygen Saturation % 96 % 99 %    Baseline Liters of Oxygen 3 L  3 liters O2 8 L    1 Minute Oxygen Saturation % 91 % 96 %    1 Minute Liters of Oxygen 3 L 8 L    2 Minute Oxygen Saturation % 86 % 90 %    2 Minute Liters of Oxygen 3 L 8 L    3 Minute Oxygen Saturation % 84 % 90 %    3 Minute Liters of Oxygen 3 L 8 L    4 Minute Oxygen Saturation % 84 % 89 %    4 Minute Liters of Oxygen 3 L 8 L    5 Minute Oxygen Saturation % 89 % 89 %    5 Minute Liters of Oxygen 3 L 8 L    6 Minute Oxygen Saturation % 86 % 89 %    6 Minute Liters of Oxygen 3 L 8 L    2  Minute Post Oxygen Saturation % 94 % 98 %    2 Minute Post Liters of Oxygen 3 L 8 L      Pre/Post BP   Baseline BP 116/76  -    6 Minute BP 140/78  -    Pre/Post BP? Yes  -       Psychological, QOL, Others - Outcomes: PHQ 2/9: Depression screen [facility] 2/9 04/20/2016 01/07/2016  Decreased Interest 0 0  Down, Depressed, Hopeless 0 0  PHQ - 2 Score 0 0    Quality of Life:     Quality of Life - 04/27/16 1359      Quality of Life Scores   Health/Function Post 20.67 %   Socioeconomic Post 17.81 %   Psych/Spiritual Post 24.71 %   Family Post 19.38 %   GLOBAL Post 20.68 %      Personal Goals: Goals established at orientation with interventions provided to work toward goal.     Personal Goals and Risk Factors at Admission - 01/07/16 1114      Core Components/Risk Factors/Patient Goals on Admission    Weight Management Obesity;Yes   Intervention Weight Management: Develop a combined nutrition and exercise program designed to reach desired caloric intake, while maintaining appropriate intake of nutrient and fiber, sodium and fats, and appropriate energy expenditure required for the weight goal.;Weight Management: Provide education and appropriate resources to help participant work on and attain dietary goals.;Obesity: Provide education and  appropriate resources to help participant work on and attain dietary goals.;Weight Management/Obesity: Establish reasonable short term and long term weight goals.   Admit Weight 254 lb 6.6 oz (115.4 kg)   Expected Outcomes Short Term: Continue to assess and modify interventions until short term weight is achieved.;Long Term: Adherence to nutrition and physical activity/exercise program aimed toward attainment of established weight goal.   Sedentary Yes   Intervention Provide advice, education, support and counseling about physical activity/exercise needs.;Develop an individualized exercise prescription for aerobic and resistive training based on initial  evaluation findings, risk stratification, comorbidities and participant's personal goals.   Expected Outcomes Achievement of increased cardiorespiratory fitness and enhanced flexibility, muscular endurance and strength shown through measurements of functional capaciy and personal statement of participant.   Improve shortness of breath with ADL's Yes   Intervention Provide education, individualized exercise plan and daily activity instruction to help decrease symptoms of SOB with activities of daily living.   Expected Outcomes Short Term: Achieves a reduction of symptoms when performing activities of daily living.   Develop more efficient breathing techniques such as purse lipped breathing and diaphragmatic breathing; and practicing self-pacing with activity Yes   Intervention Provide education, demonstration and support about specific breathing techniuqes utilized for more efficient breathing. Include techniques such as pursed lipped breathing, diaphragmatic breathing and self-pacing activity.   Expected Outcomes Short Term: Participant will be able to demonstrate and use breathing techniques as needed throughout daily activities.   Increase knowledge of respiratory medications and ability to use respiratory devices properly  Yes   Intervention Provide education and demonstration as needed of appropriate use of medications, inhalers, and oxygen therapy.   Expected Outcomes Short Term: Achieves understanding of medications use. Understands that oxygen is a medication prescribed by physician. Demonstrates appropriate use of inhaler and oxygen therapy.   Diabetes Yes   Intervention Provide education about signs/symptoms and action to take for hypo/hyperglycemia.;Provide education about proper nutrition, including hydration, and aerobic/resistive exercise prescription along with prescribed medications to achieve blood glucose in normal ranges: Fasting glucose 65-99 mg/dL   Expected Outcomes Short Term:  Participant verbalizes understanding of the signs/symptoms and immediate care of hyper/hypoglycemia, proper foot care and importance of medication, aerobic/resistive exercise and nutrition plan for blood glucose control.;Long Term: Attainment of HbA1C < 7%.   Hypertension Yes   Intervention Provide education on lifestyle modifcations including regular physical activity/exercise, weight management, moderate sodium restriction and increased consumption of fresh fruit, vegetables, and low fat dairy, alcohol moderation, and smoking cessation.;Monitor prescription use compliance.   Expected Outcomes Short Term: Continued assessment and intervention until BP is < 140/21m HG in hypertensive participants. < 130/819mHG in hypertensive participants with diabetes, heart failure or chronic kidney disease.;Long Term: Maintenance of blood pressure at goal levels.   Personal Goal Other Yes   Personal Goal possibly be able to golf and bowl again   Intervention build stamina and stregnth thru increasing workloads on exercise equiptment   Expected Outcomes patient will feel that he has enough stamina and stregnth to participate in the above activities even if activity has time modifications       Personal Goals Discharge:     Goals and Risk Factor Review    Row Name 01/24/16 0720 02/10/16 0807 03/07/16 1556 04/06/16 1254       Core Components/Risk Factors/Patient Goals Review   Personal Goals Review  - Weight Management/Obesity;Increase Strength and Stamina;Develop more efficient breathing techniques such as purse lipped breathing and diaphragmatic breathing and practicing  self-pacing with activity.;Improve shortness of breath with ADL's;Sedentary Weight Management/Obesity;Increase Strength and Stamina;Develop more efficient breathing techniques such as purse lipped breathing and diaphragmatic breathing and practicing self-pacing with activity.;Improve shortness of breath with ADL's;Sedentary Weight  Management/Obesity;Increase Strength and Stamina;Develop more efficient breathing techniques such as purse lipped breathing and diaphragmatic breathing and practicing self-pacing with activity.;Improve shortness of breath with ADL's;Sedentary    Review Patient has only attended 1 exercise session. Will continue to evaluate progression of personal goals. see "comments" section on ITP see "comments" section on ITP see "comments" section on ITP    Expected Outcomes see expected outcomes on intial review see Admission expected outcomes see Admission expected outcomes see Admission expected outcomes       Nutrition & Weight - Outcomes:     Pre Biometrics - 01/07/16 1112      Pre Biometrics   Grip Strength 48 kg       Nutrition:     Nutrition Therapy & Goals - 01/31/16 0944      Nutrition Therapy   Diet Diabetic, Low Sodium     Personal Nutrition Goals   Personal Goal #1 Wt loss of 6-24 lb at graduation from Pulmonary Rehab   Personal Goal #2 Improve glycemic control as evidenced by an A1c < 7.0     Intervention Plan   Intervention Prescribe, educate and counsel regarding individualized specific dietary modifications aiming towards targeted core components such as weight, hypertension, lipid management, diabetes, heart failure and other comorbidities.   Expected Outcomes Short Term Goal: Understand basic principles of dietary content, such as calories, fat, sodium, cholesterol and nutrients.;Long Term Goal: Adherence to prescribed nutrition plan.      Nutrition Discharge:     Nutrition Assessments - 05/26/16 1031      Rate Your Plate Scores   Pre Score 45   Post Score 60      Education Questionnaire Score:     Knowledge Questionnaire Score - 04/27/16 1359      Knowledge Questionnaire Score   Post Score 10/13     Goals reviewed with patient. All goals met. Patient plans to continue to exercise post discharge at home and at the local gym.

## 2016-06-13 NOTE — Addendum Note (Signed)
Encounter addended by: Oscar LaPortia E Jerimy Johanson, RN on: 06/13/2016  7:51 AM<BR>    Actions taken: Sign clinical note

## 2016-06-15 ENCOUNTER — Other Ambulatory Visit: Payer: Self-pay | Admitting: Family Medicine

## 2016-06-19 NOTE — Telephone Encounter (Signed)
LRF 04/07/16 #90 + 1  Too soon refill denied

## 2016-06-25 IMAGING — CR DG CHEST 1V PORT
1 series · 1 of 1 positions shown · non-contrast
Comparison: 06/05/2013.

CLINICAL DATA: 59-year-old male with irregular heart rate and
shortness of Breath for several days. Initial encounter.

EXAM:
PORTABLE CHEST - 1 VIEW

[AP]
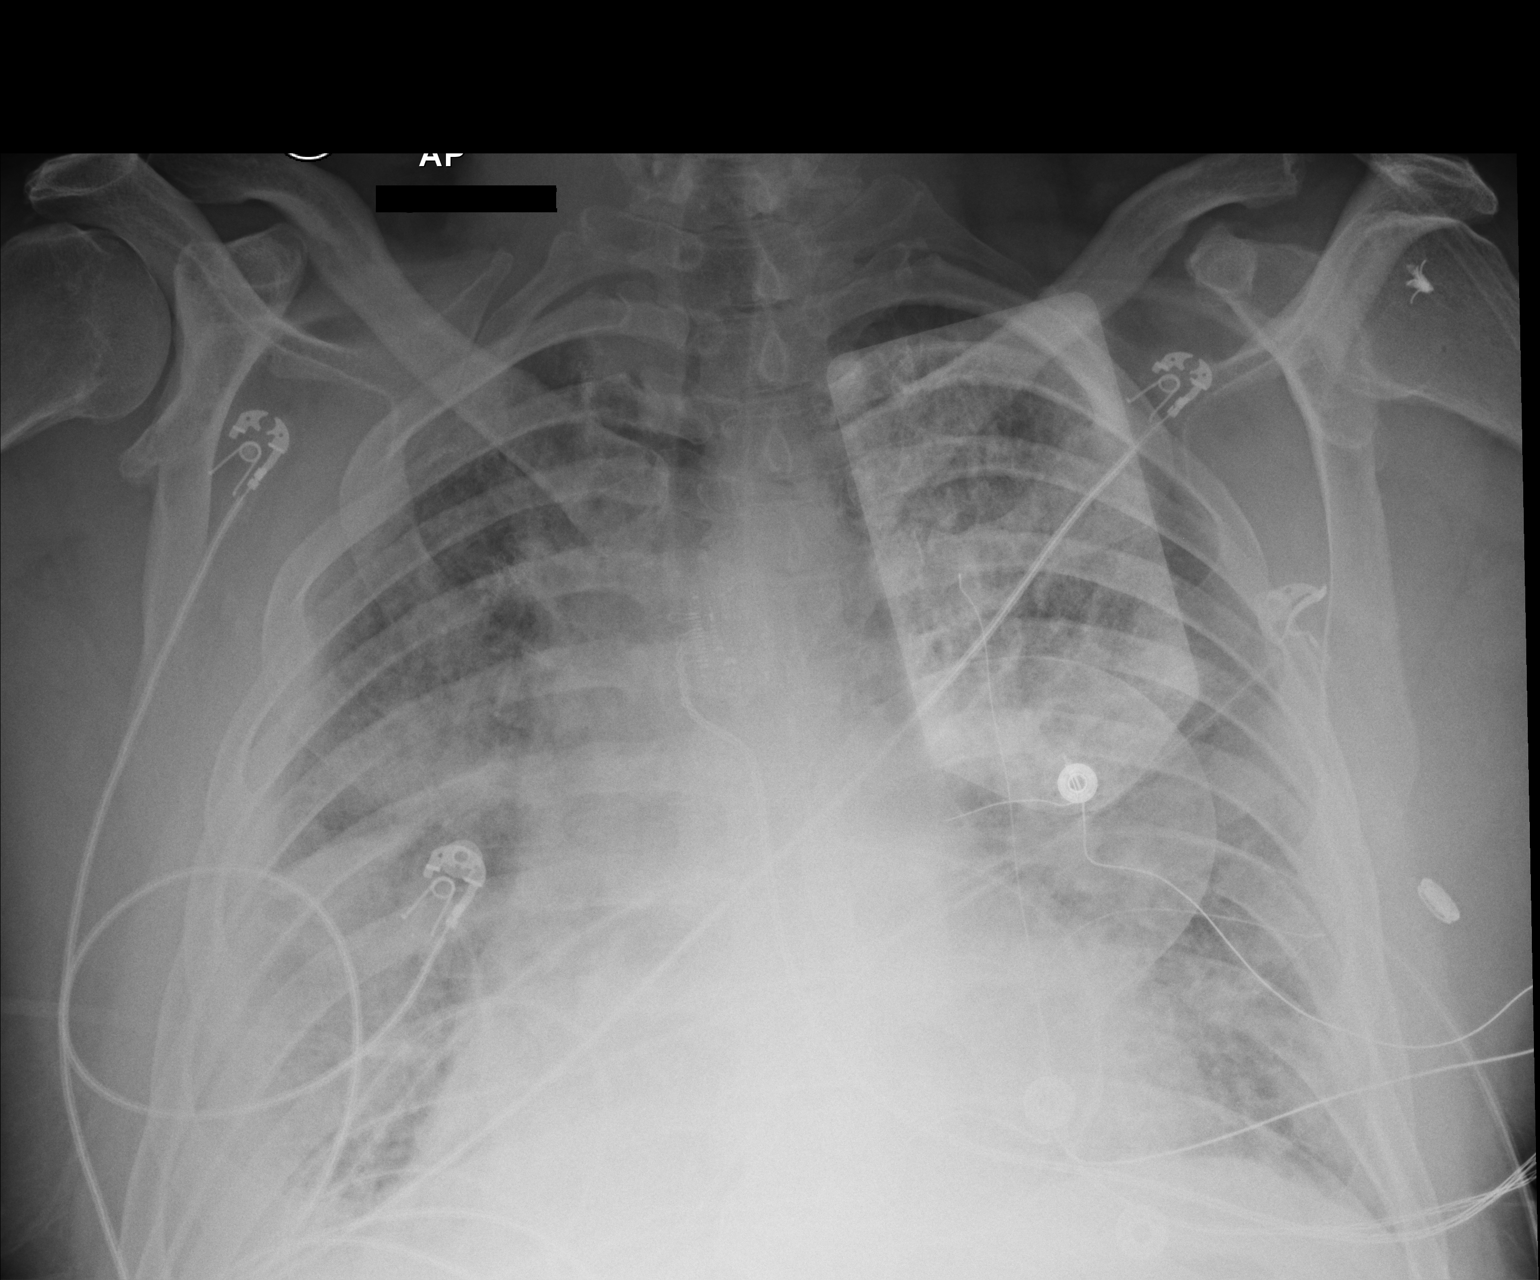

[1 of 1 positions shown; findings below may reference images not displayed]

FINDINGS: Portable AP view at 9811 hours. Diffuse increased pulmonary opacity,
mixed interstitial and airspace. Resuscitation pads project over the
left chest. Mildly lower lung volumes. Stable cardiomegaly and
mediastinal contours. No pneumothorax. No large pleural effusion.
IMPRESSION: Diffuse increased bilateral pulmonary opacity, top considerations
are bilateral pneumonia and acute pulmonary edema.

## 2016-06-25 IMAGING — CR DG CHEST 1V PORT
2 series · 2 of 2 positions shown · non-contrast
Comparison: 06/05/2013.

CLINICAL DATA: 59-year-old male with irregular heart rate and
shortness of Breath for several days. Initial encounter.

EXAM:
PORTABLE CHEST - 1 VIEW

[AP (1 of 2)]
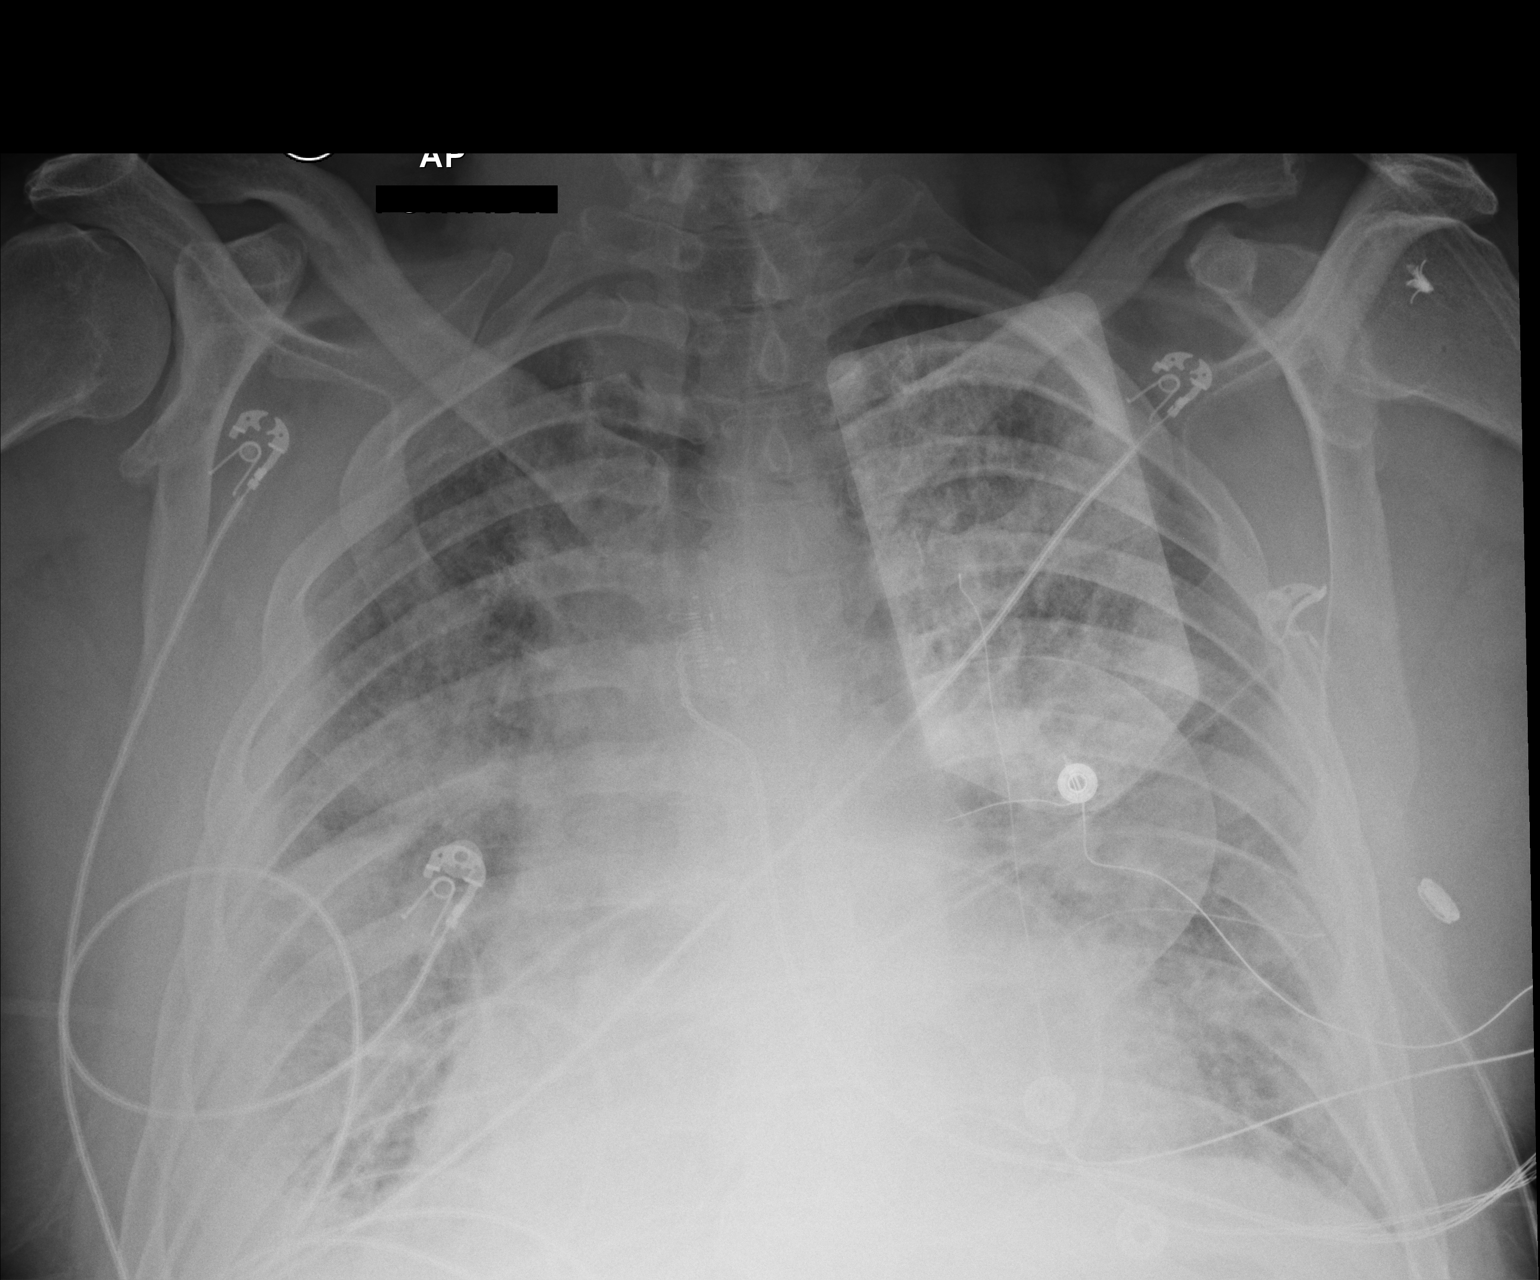

[AP (2 of 2)]
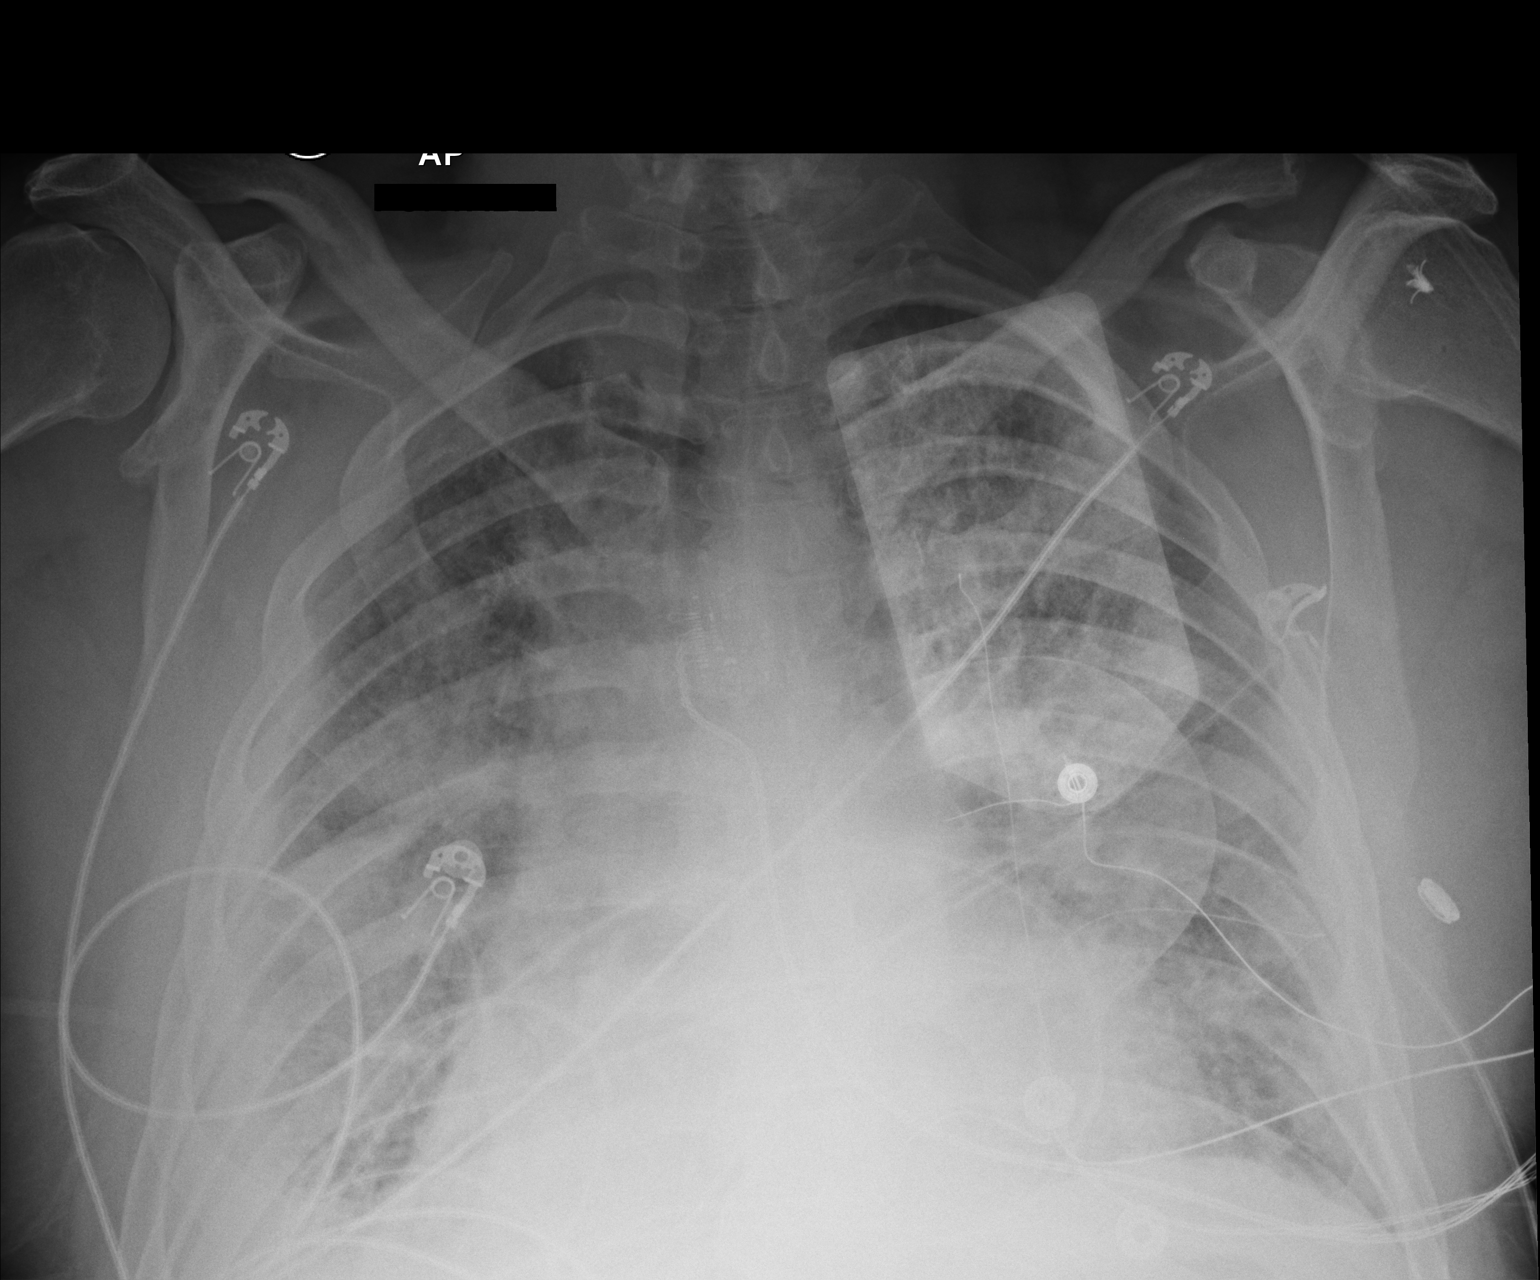

[2 of 2 positions shown; findings below may reference images not displayed]

FINDINGS: Portable AP view at 9811 hours. Diffuse increased pulmonary opacity,
mixed interstitial and airspace. Resuscitation pads project over the
left chest. Mildly lower lung volumes. Stable cardiomegaly and
mediastinal contours. No pneumothorax. No large pleural effusion.
IMPRESSION: Diffuse increased bilateral pulmonary opacity, top considerations
are bilateral pneumonia and acute pulmonary edema.

## 2016-06-27 ENCOUNTER — Encounter: Payer: Self-pay | Admitting: Internal Medicine

## 2016-06-27 ENCOUNTER — Ambulatory Visit (INDEPENDENT_AMBULATORY_CARE_PROVIDER_SITE_OTHER): Payer: PPO | Admitting: Internal Medicine

## 2016-06-27 VITALS — BP 110/64 | HR 55 | Ht 73.0 in | Wt 244.0 lb

## 2016-06-27 DIAGNOSIS — Z23 Encounter for immunization: Secondary | ICD-10-CM

## 2016-06-27 DIAGNOSIS — E669 Obesity, unspecified: Secondary | ICD-10-CM

## 2016-06-27 DIAGNOSIS — J84112 Idiopathic pulmonary fibrosis: Secondary | ICD-10-CM

## 2016-06-27 DIAGNOSIS — Z006 Encounter for examination for normal comparison and control in clinical research program: Secondary | ICD-10-CM

## 2016-06-27 NOTE — Patient Instructions (Addendum)
ICD-9-CM ICD-10-CM   1. IPF (idiopathic pulmonary fibrosis) (HCC) 516.31 J84.112   2. Research subject V70.7 Z00.6   3. Obesity 278.00 E66.9     IPF appears stable Regular  flu shot 06/27/2016  Continue weight loss   - sorry to hear about loss of dog Do spirometry and dlco in 3 months  Followup - spirometry and dlco in 3 months - return to see me in 3 months after spirometry and dlco

## 2016-06-27 NOTE — Progress Notes (Signed)
IPF-PRO Registry  This subject was onsite for a routine visit with Dr. [name] so his 6 month follow-up visit for the IPF-PRO Registry was completed as well. The subject completed all required questionnaires and bloodwork per protocol. Original questionnaires are stored in the subject binder. Subject stated his desire to continue participating in the registry. I thanked him for his time and contribution to research.   Carron CurieJennifer Deni Berti, CMA, Research Assistant

## 2016-06-27 NOTE — Progress Notes (Signed)
Subjective:     Patient ID: Jerry Montgomery, male   DOB: January 21, 1956, 60 y.o.   MRN: [id number]    HPI   OV 06/27/2016  Chief Complaint  Patient presents with  . Follow-up    no coughing, and no wheezing     Follow-up idiopathic pulmonary fibrosis. He is on oxygen. There is a routine follow-up. Overall stable. He lost a lot of weight but gained some weight back because his dog of 6 years died from diabetes month ago. He is still grieving significantly.nevertheless he is net weight loss. Of note he failed both anti-fibrotic therapies because of intolerance. He is exercising regularly.he is active in the pulmonary fibrosis foundation support group. Is a participant in the IPF registry sponsored by [facility] and able to the questionnaire today   has a past medical history of Acute diastolic heart failure ([place]); Atrial fibrillation ([place]); Atrial tachycardia ([place]); CAD (coronary artery disease); COPD (chronic obstructive pulmonary disease) ([place]); Hyperlipidemia; Hypertension; Hypothyroidism; On home oxygen therapy; Pneumothorax on right (4/11); Postinflammatory pulmonary fibrosis ([place]); Sleep apnea; SVT (supraventricular tachycardia) ([place]); Type II diabetes mellitus ([place]); and WPW (Wolff-Parkinson-White syndrome).   reports that he quit smoking about 8 years ago. His smoking use included Cigarettes. He has a 52.50 pack-year smoking history. He has never used smokeless tobacco.  Past Surgical History:  Procedure Laterality Date  . BILATERAL VATS ABLATION Right   . CARDIAC CATHETERIZATION  05/03/2007   patent stents  . CARDIAC CATHETERIZATION N/A 05/18/2015   Procedure: Right Heart Cath;  Surgeon: Belva Crome, MD;  Location: [facility];  Service: Cardiovascular;  Laterality: N/A;  . CORONARY ANGIOPLASTY WITH STENT PLACEMENT  03/05/2003; 2008; 01/13/2010   PCI & Stent  LAD & mid CX; stent to CX  (I've got 4 stents total" (04/15/2015)  . ELECTROPHYSIOLOGIC STUDY N/A 04/14/2015   Procedure: SVT  Ablation;  Surgeon: Evans Lance, MD;  Location: [place] [facility];  Service: Cardiovascular;  Laterality: N/A;  . KNEE ARTHROSCOPY Right 01/22/2013   Procedure: RIGHT ARTHROSCOPY KNEE WITH DEBRIDEMENT, abrasion chondroplasty of lateral tibial plateau, debridement of partial tear of ACL, menisectomy;  Surgeon: Tobi Bastos, MD;  Location: [facility];  Service: Orthopedics;  Laterality: Right;  . KNEE SURGERY  2012   "reattached quad"  . LUNG LOBECTOMY Right   . SHOULDER ARTHROSCOPY W/ ROTATOR CUFF REPAIR Bilateral   . VIDEO ASSISTED THORACOSCOPY Left 07/19/2015   Procedure: LEFT VIDEO ASSISTED THORACOSCOPY WITH LEFT UPPER AND LOWER LUNG BIOPSY;  Surgeon: Melrose Nakayama, MD;  Location: [facility];  Service: Thoracic;  Laterality: Left;  Marland Kitchen VIDEO BRONCHOSCOPY N/A 07/19/2015   Procedure: VIDEO BRONCHOSCOPY WITH FLUORO;  Surgeon: Melrose Nakayama, MD;  Location: [place];  Service: Thoracic;  Laterality: N/A;    Allergies  Allergen Reactions  . Niaspan [Niacin] Itching  . Ofev [Nintedanib] Other (See Comments)    Lethargic, wiped out  . Pirfenidone   . Altace [Ramipril] Other (See Comments)    Cough     Immunization History  Administered Date(s) Administered  . Influenza Whole 07/23/2012  . Influenza,inj,Quad PF,36+ Mos 07/08/2015  . Influenza-Unspecified 07/16/2014  . Pneumococcal Conjugate-13 11/10/2013  . Pneumococcal Polysaccharide-23 07/08/2015    Family History  Problem Relation Age of Onset  . Breast cancer Mother   . Diabetes type II Mother   . Lung cancer Father      Current Outpatient Prescriptions:  .  ADVAIR HFA 115-21 MCG/ACT inhaler, Inhale 2 puffs into  the lungs 2 (two) times daily. Reported on 02/25/2016, Disp: , Rfl:  .  albuterol (PROVENTIL HFA;VENTOLIN HFA) 108 (90 Base) MCG/ACT inhaler, Inhale 2 puffs into the lungs every 6 (six) hours as needed for wheezing., Disp: 3 Inhaler, Rfl: 3 .  ALPRAZolam (XANAX) 0.5 MG tablet, Take 1 tablet (0.5 mg total) by mouth 3  (three) times daily as needed for anxiety., Disp: 90 tablet, Rfl: 0 .  aspirin 81 MG tablet, Take 81 mg by mouth every morning. , Disp: , Rfl:  .  atorvastatin (LIPITOR) 40 MG tablet, Take 1 tablet by mouth every night at bedtime, Disp: 90 tablet, Rfl: 3 .  blood glucose meter kit and supplies KIT, Dispense based on patient and insurance preference. Use up to four times daily as directed. (FOR ICD E11.65), Disp: 1 each, Rfl: 0 .  Blood Glucose Monitoring Suppl (ONE TOUCH ULTRA SYSTEM KIT) w/Device KIT, Use to monitor FSBS 4x daily for fluctuating blood sugars. Dx: E11.65., Disp: 1 each, Rfl: 1 .  clopidogrel (PLAVIX) 75 MG tablet, Take 1 tablet by mouth  every day, Disp: 90 tablet, Rfl: 3 .  diltiazem (CARDIZEM) 30 MG tablet, Take 1 tablet (30 mg total) by mouth every 6 (six) hours., Disp: 360 tablet, Rfl: 1 .  furosemide (LASIX) 20 MG tablet, Take 1 tablet (20 mg total) by mouth daily as needed for fluid (fluid). (Patient taking differently: Take 20 mg by mouth daily. ), Disp: 90 tablet, Rfl: 3 .  glucose blood test strip, Use to monitor FSBS 4x daily for fluctuating blood sugars. Dx: E11.65., Disp: 400 each, Rfl: 4 .  insulin aspart (NOVOLOG) 100 UNIT/ML injection, 10 UNITS WITH BREAKFAST AND 10 UNITS WITH LUNCH, 30 UNITS WITH SUPPER (Patient taking differently: Inject 10-30 Units into the skin See admin instructions. 10 UNITS WITH BREAKFAST AND 10 UNITS WITH LUNCH, 30 UNITS WITH SUPPER), Disp: 60 mL, Rfl: 4 .  insulin glargine (LANTUS) 100 UNIT/ML injection, Inject 0.6 mLs (60 Units total) into the skin at bedtime., Disp: 60 mL, Rfl: 3 .  isosorbide mononitrate (IMDUR) 60 MG 24 hr tablet, Take 60 mg by mouth daily., Disp: , Rfl:  .  levothyroxine (SYNTHROID, LEVOTHROID) 125 MCG tablet, Take 1 tablet (125 mcg total) by mouth daily., Disp: 90 tablet, Rfl: 3 .  Menthol, Topical Analgesic, (BIOFREEZE EX), Apply 1 application topically as needed (for pain)., Disp: , Rfl:  .  metFORMIN (GLUCOPHAGE) 850 MG  tablet, Take 1 tablet by mouth  twice a day, Disp: 180 tablet, Rfl: 3 .  metoprolol (TOPROL XL) 200 MG 24 hr tablet, Take 1 tablet (200 mg total) by mouth daily., Disp: 90 tablet, Rfl: 3 .  nitroGLYCERIN (NITROSTAT) 0.4 MG SL tablet, Place 1 tablet (0.4 mg total) under the tongue every 5 (five) minutes as needed for chest pain., Disp: 25 tablet, Rfl: 1 .  SYRINGE/NEEDLE, DISP, 1 ML (B-D SYRINGE/NEEDLE 1CC/25GX5/8) 25G X 5/8" 1 ML MISC, As directed, Disp: 100 each, Rfl: 5 .  tiotropium (SPIRIVA) 18 MCG inhalation capsule, Place 18 mcg into inhaler and inhale daily. Reported on 02/25/2016, Disp: , Rfl:  .  zolpidem (AMBIEN) 10 MG tablet, Take 1 tablet (10 mg total) by mouth at bedtime., Disp: 90 tablet, Rfl: 1    Review of Systems     Objective:   Physical Exam  Constitutional: He is oriented to person, place, and time. He appears well-developed and well-nourished. No distress.  HENT:  Head: Normocephalic and atraumatic.  Right Ear: External ear  normal.  Left Ear: External ear normal.  Mouth/Throat: Oropharynx is clear and moist. No oropharyngeal exudate.  Eyes: Conjunctivae and EOM are normal. Pupils are equal, round, and reactive to light. Right eye exhibits no discharge. Left eye exhibits no discharge. No scleral icterus.  Neck: Normal range of motion. Neck supple. No JVD present. No tracheal deviation present. No thyromegaly present.  Cardiovascular: Normal rate, regular rhythm and intact distal pulses.  Exam reveals no gallop and no friction rub.   No murmur heard. Pulmonary/Chest: Effort normal and breath sounds normal. No respiratory distress. He has no wheezes. He has no rales. He exhibits no tenderness.  Abdominal: Soft. Bowel sounds are normal. He exhibits no distension and no mass. There is no tenderness. There is no rebound and no guarding.  Musculoskeletal: Normal range of motion. He exhibits no edema or tenderness.  Lymphadenopathy:    He has no cervical adenopathy.   Neurological: He is alert and oriented to person, place, and time. He has normal reflexes. No cranial nerve deficit. Coordination normal.  Skin: Skin is warm and dry. No rash noted. He is not diaphoretic. No erythema. No pallor.  Psychiatric: He has a normal mood and affect. His behavior is normal. Judgment and thought content normal.  Nursing note and vitals reviewed.   Vitals:   06/27/16 1045 06/27/16 1050  BP: 110/64   Pulse: (!) 55   SpO2:  97%  Weight: 244 lb (110.7 kg)   Height: _0  (1.854 m)     Estimated body mass index is 32.19 kg/m as calculated from the following:   Height as of this encounter: _1  (1.854 m).   Weight as of this encounter: 244 lb (110.7 kg).  259 pounds in may 2017      Assessment:       ICD-9-CM ICD-10-CM   1. IPF (idiopathic pulmonary fibrosis) (HCC) 516.31 J84.112   2. Research subject V70.7 Z00.6   3. Obesity 278.00 E66.9    Standard of care primary visit for IPF. Reset subjective this is secondary.    Plan:      IPF appears stable  dose flu shot 06/27/2016  Continue weight loss   - sorry to hear about loss of dog Do spirometry and dlco in 3 months  Followup - spirometry and dlco in 3 months - return to see me in 3 months after spirometry and dlco

## 2016-07-10 DIAGNOSIS — R269 Unspecified abnormalities of gait and mobility: Secondary | ICD-10-CM | POA: Diagnosis not present

## 2016-07-10 DIAGNOSIS — J449 Chronic obstructive pulmonary disease, unspecified: Secondary | ICD-10-CM | POA: Diagnosis not present

## 2016-07-10 DIAGNOSIS — J8410 Pulmonary fibrosis, unspecified: Secondary | ICD-10-CM | POA: Diagnosis not present

## 2016-07-10 DIAGNOSIS — J438 Other emphysema: Secondary | ICD-10-CM | POA: Diagnosis not present

## 2016-08-07 ENCOUNTER — Other Ambulatory Visit: Payer: PPO

## 2016-08-07 DIAGNOSIS — IMO0001 Reserved for inherently not codable concepts without codable children: Secondary | ICD-10-CM

## 2016-08-07 DIAGNOSIS — J8410 Pulmonary fibrosis, unspecified: Secondary | ICD-10-CM | POA: Diagnosis not present

## 2016-08-07 DIAGNOSIS — E039 Hypothyroidism, unspecified: Secondary | ICD-10-CM

## 2016-08-07 DIAGNOSIS — I10 Essential (primary) hypertension: Secondary | ICD-10-CM

## 2016-08-07 DIAGNOSIS — E785 Hyperlipidemia, unspecified: Secondary | ICD-10-CM | POA: Diagnosis not present

## 2016-08-07 DIAGNOSIS — E119 Type 2 diabetes mellitus without complications: Secondary | ICD-10-CM | POA: Diagnosis not present

## 2016-08-07 DIAGNOSIS — Z794 Long term (current) use of insulin: Secondary | ICD-10-CM | POA: Diagnosis not present

## 2016-08-07 LAB — CBC WITH DIFFERENTIAL/PLATELET
BASOS PCT: 0 %
Basophils Absolute: 0 cells/uL (ref 0–200)
Eosinophils Absolute: 195 cells/uL (ref 15–500)
Eosinophils Relative: 3 %
HEMATOCRIT: 45.8 % (ref 38.5–50.0)
Hemoglobin: 15.3 g/dL (ref 13.0–17.0)
LYMPHS PCT: 19 %
Lymphs Abs: 1235 cells/uL (ref 850–3900)
MCH: 28.9 pg (ref 27.0–33.0)
MCHC: 33.4 g/dL (ref 32.0–36.0)
MCV: 86.6 fL (ref 80.0–100.0)
MONO ABS: 845 {cells}/uL (ref 200–950)
MONOS PCT: 13 %
MPV: 9.8 fL (ref 7.5–12.5)
NEUTROS PCT: 65 %
Neutro Abs: 4225 cells/uL (ref 1500–7800)
PLATELETS: 178 10*3/uL (ref 140–400)
RBC: 5.29 MIL/uL (ref 4.20–5.80)
RDW: 15.7 % — AB (ref 11.0–15.0)
WBC: 6.5 10*3/uL (ref 3.8–10.8)

## 2016-08-07 LAB — COMPREHENSIVE METABOLIC PANEL
ALT: 18 U/L (ref 9–46)
AST: 17 U/L (ref 10–35)
Albumin: 4.1 g/dL (ref 3.6–5.1)
Alkaline Phosphatase: 53 U/L (ref 40–115)
BUN: 19 mg/dL (ref 7–25)
CALCIUM: 9.9 mg/dL (ref 8.6–10.3)
CO2: 31 mmol/L (ref 20–31)
Chloride: 102 mmol/L (ref 98–110)
Creat: 1.28 mg/dL — ABNORMAL HIGH (ref 0.70–1.25)
GLUCOSE: 88 mg/dL (ref 70–99)
POTASSIUM: 4.7 mmol/L (ref 3.5–5.3)
Sodium: 143 mmol/L (ref 135–146)
Total Bilirubin: 0.8 mg/dL (ref 0.2–1.2)
Total Protein: 6.9 g/dL (ref 6.1–8.1)

## 2016-08-07 LAB — LIPID PANEL
CHOL/HDL RATIO: 3.5 ratio (ref <=5.0)
CHOLESTEROL: 116 mg/dL — AB (ref 125–200)
HDL: 33 mg/dL — ABNORMAL LOW (ref >=40)
LDL CALC: 61 mg/dL (ref <130)
TRIGLYCERIDES: 112 mg/dL (ref <150)
VLDL: 22 mg/dL (ref <30)

## 2016-08-07 LAB — TSH: TSH: 2.36 mIU/L (ref 0.40–4.50)

## 2016-08-08 LAB — HEMOGLOBIN A1C
Hgb A1c MFr Bld: 7.5 % — ABNORMAL HIGH (ref <5.7)
Mean Plasma Glucose: 169 mg/dL

## 2016-08-09 DIAGNOSIS — J8410 Pulmonary fibrosis, unspecified: Secondary | ICD-10-CM | POA: Diagnosis not present

## 2016-08-09 DIAGNOSIS — J449 Chronic obstructive pulmonary disease, unspecified: Secondary | ICD-10-CM | POA: Diagnosis not present

## 2016-08-09 DIAGNOSIS — J438 Other emphysema: Secondary | ICD-10-CM | POA: Diagnosis not present

## 2016-08-09 DIAGNOSIS — R269 Unspecified abnormalities of gait and mobility: Secondary | ICD-10-CM | POA: Diagnosis not present

## 2016-08-11 ENCOUNTER — Ambulatory Visit (INDEPENDENT_AMBULATORY_CARE_PROVIDER_SITE_OTHER): Payer: PPO | Admitting: Family Medicine

## 2016-08-11 ENCOUNTER — Encounter: Payer: Self-pay | Admitting: Family Medicine

## 2016-08-11 VITALS — BP 110/70 | HR 60 | Temp 97.5°F | Resp 16 | Ht 73.0 in | Wt 242.0 lb

## 2016-08-11 DIAGNOSIS — I10 Essential (primary) hypertension: Secondary | ICD-10-CM | POA: Diagnosis not present

## 2016-08-11 DIAGNOSIS — Z794 Long term (current) use of insulin: Secondary | ICD-10-CM

## 2016-08-11 DIAGNOSIS — R0602 Shortness of breath: Secondary | ICD-10-CM

## 2016-08-11 DIAGNOSIS — J8410 Pulmonary fibrosis, unspecified: Secondary | ICD-10-CM | POA: Diagnosis not present

## 2016-08-11 DIAGNOSIS — E1100 Type 2 diabetes mellitus with hyperosmolarity without nonketotic hyperglycemic-hyperosmolar coma (NKHHC): Secondary | ICD-10-CM | POA: Diagnosis not present

## 2016-08-11 DIAGNOSIS — I499 Cardiac arrhythmia, unspecified: Secondary | ICD-10-CM

## 2016-08-11 NOTE — Progress Notes (Signed)
Subjective:    Patient ID: Jerry Montgomery, male    DOB: April 03, 1956, 60 y.o.   MRN: [id number]  HPI Patient is here today for follow-up of his diabetes. His most recent lab work as listed below: Appointment on 08/07/2016  Component Date Value Ref Range Status  . WBC 08/07/2016 6.5  3.8 - 10.8 K/uL Final  . RBC 08/07/2016 5.29  4.20 - 5.80 MIL/uL Final  . Hemoglobin 08/07/2016 15.3  13.0 - 17.0 g/dL Final  . HCT 08/07/2016 45.8  38.5 - 50.0 % Final  . MCV 08/07/2016 86.6  80.0 - 100.0 fL Final  . MCH 08/07/2016 28.9  27.0 - 33.0 pg Final  . MCHC 08/07/2016 33.4  32.0 - 36.0 g/dL Final  . RDW 08/07/2016 15.7* 11.0 - 15.0 % Final  . Platelets 08/07/2016 178  140 - 400 K/uL Final  . MPV 08/07/2016 9.8  7.5 - 12.5 fL Final  . Neutro Abs 08/07/2016 4225  1,500 - 7,800 cells/uL Final  . Lymphs Abs 08/07/2016 1235  850 - 3,900 cells/uL Final  . Monocytes Absolute 08/07/2016 845  200 - 950 cells/uL Final  . Eosinophils Absolute 08/07/2016 195  15 - 500 cells/uL Final  . Basophils Absolute 08/07/2016 0  0 - 200 cells/uL Final  . Neutrophils Relative % 08/07/2016 65  % Final  . Lymphocytes Relative 08/07/2016 19  % Final  . Monocytes Relative 08/07/2016 13  % Final  . Eosinophils Relative 08/07/2016 3  % Final  . Basophils Relative 08/07/2016 0  % Final  . Smear Review 08/07/2016 Criteria for review not met   Final  . Sodium 08/07/2016 143  135 - 146 mmol/L Final  . Potassium 08/07/2016 4.7  3.5 - 5.3 mmol/L Final  . Chloride 08/07/2016 102  98 - 110 mmol/L Final  . CO2 08/07/2016 31  20 - 31 mmol/L Final  . Glucose, Bld 08/07/2016 88  70 - 99 mg/dL Final  . BUN 08/07/2016 19  7 - 25 mg/dL Final  . Creat 08/07/2016 1.28* 0.70 - 1.25 mg/dL Final   Comment:   For patients > or = 62 years of age: The upper reference limit for Creatinine is approximately 13% higher for people identified as African-American.     . Total Bilirubin 08/07/2016 0.8  0.2 - 1.2 mg/dL Final  . Alkaline  Phosphatase 08/07/2016 53  40 - 115 U/L Final  . AST 08/07/2016 17  10 - 35 U/L Final  . ALT 08/07/2016 18  9 - 46 U/L Final  . Total Protein 08/07/2016 6.9  6.1 - 8.1 g/dL Final  . Albumin 08/07/2016 4.1  3.6 - 5.1 g/dL Final  . Calcium 08/07/2016 9.9  8.6 - 10.3 mg/dL Final  . Hgb A1c MFr Bld 08/08/2016 7.5* <5.7 % Final   Comment:   For someone without known diabetes, a hemoglobin A1c value of 6.5% or greater indicates that they may have diabetes and this should be confirmed with a follow-up test.   For someone with known diabetes, a value <7% indicates that their diabetes is well controlled and a value greater than or equal to 7% indicates suboptimal control. A1c targets should be individualized based on duration of diabetes, age, comorbid conditions, and other considerations.   Currently, no consensus exists for use of hemoglobin A1c for diagnosis of diabetes for children.     . Mean Plasma Glucose 08/08/2016 169  mg/dL Final  . Cholesterol 08/07/2016 116* 125 - 200 mg/dL Final  .  Triglycerides 08/07/2016 112  <150 mg/dL Final  . HDL 08/07/2016 33* >=40 mg/dL Final  . Total CHOL/HDL Ratio 08/07/2016 3.5  <=5.0 Ratio Final  . VLDL 08/07/2016 22  <30 mg/dL Final  . LDL Cholesterol 08/07/2016 61  <130 mg/dL Final   Comment:   Total Cholesterol/HDL Ratio:CHD Risk                        Coronary Heart Disease Risk Table                                        Men       Women          1/2 Average Risk              3.4        3.3              Average Risk              5.0        4.4           2X Average Risk              9.6        7.1           3X Average Risk             23.4       11.0 Use the calculated Patient Ratio above and the CHD Risk table  to determine the patient's CHD Risk.   . TSH 08/07/2016 2.36  0.40 - 4.50 mIU/L Final   Hemoglobin and A1c is 7.5 which I believe for this patient is reasonable given his pulmonary fibrosis. He is overdue for colonoscopy. His LDL  is below his goal of less than 70 for somebody with a history of heart disease.  Of note on exam today, he has a profound bradycardia with a heart rate of 44 bpm. EKG confirms sinus bradycardia with no evidence of ischemia or infarction. He denies any chest pain. He has stable underlying shortness of breath. He has gained 14 pounds since his last office visit. He attributes this to inactivity after his dog passed away. He has not been exercising. He is also not been watching his diet. Past Medical History:  Diagnosis Date  . Acute diastolic heart failure (HCC)   . Atrial fibrillation (HCC)   . Atrial tachycardia (HCC)   . CAD (coronary artery disease)   . COPD (chronic obstructive pulmonary disease) (HCC)   . Hyperlipidemia   . Hypertension   . Hypothyroidism   . On home oxygen therapy    "3L; 24/7" (04/15/2015)  . Pneumothorax on right 4/11  . Postinflammatory pulmonary fibrosis (HCC)   . Sleep apnea    "won't use CPAP" (04/14/2105)  . SVT (supraventricular tachycardia) (HCC)    Per Dr. Taylor's note  . Type II diabetes mellitus (HCC)   . WPW (Wolff-Parkinson-White syndrome)    Per Dr. Taylor's Note   Past Surgical History:  Procedure Laterality Date  . BILATERAL VATS ABLATION Right   . CARDIAC CATHETERIZATION  05/03/2007   patent stents  . CARDIAC CATHETERIZATION N/A 05/18/2015   Procedure: Right Heart Cath;  Surgeon: Henry W Smith, MD;  [facility];  Service: Cardiovascular;  Laterality: N/A;  . CORONARY ANGIOPLASTY WITH STENT   PLACEMENT  03/05/2003; 2008; 01/13/2010   PCI & Stent  LAD & mid CX; stent to CX  (I've got 4 stents total" (04/15/2015)  . ELECTROPHYSIOLOGIC STUDY N/A 04/14/2015   Procedure: SVT Ablation;  Surgeon: Gregg W Taylor, MD;  Location: [facility];  Service: Cardiovascular;  Laterality: N/A;  . KNEE ARTHROSCOPY Right 01/22/2013   Procedure: RIGHT ARTHROSCOPY KNEE WITH DEBRIDEMENT, abrasion chondroplasty of lateral tibial plateau, debridement of  partial tear of ACL, menisectomy;  Surgeon: Ronald A Gioffre, MD;  Location: [facility];  Service: Orthopedics;  Laterality: Right;  . KNEE SURGERY  2012   "reattached quad"  . LUNG LOBECTOMY Right   . SHOULDER ARTHROSCOPY W/ ROTATOR CUFF REPAIR Bilateral   . VIDEO ASSISTED THORACOSCOPY Left 07/19/2015   Procedure: LEFT VIDEO ASSISTED THORACOSCOPY WITH LEFT UPPER AND LOWER LUNG BIOPSY;  Surgeon: Steven C Hendrickson, MD;  Location: [facility];  Service: Thoracic;  Laterality: Left;  . VIDEO BRONCHOSCOPY N/A 07/19/2015   Procedure: VIDEO BRONCHOSCOPY WITH FLUORO;  Surgeon: Steven C Hendrickson, MD;  Location: [facility];  Service: Thoracic;  Laterality: N/A;   Current Outpatient Prescriptions on File Prior to Visit  Medication Sig Dispense Refill  . ADVAIR HFA 115-21 MCG/ACT inhaler Inhale 2 puffs into the lungs 2 (two) times daily. Reported on 02/25/2016    . albuterol (PROVENTIL HFA;VENTOLIN HFA) 108 (90 Base) MCG/ACT inhaler Inhale 2 puffs into the lungs every 6 (six) hours as needed for wheezing. 3 Inhaler 3  . ALPRAZolam (XANAX) 0.5 MG tablet Take 1 tablet (0.5 mg total) by mouth 3 (three) times daily as needed for anxiety. 90 tablet 0  . aspirin 81 MG tablet Take 81 mg by mouth every morning.     . atorvastatin (LIPITOR) 40 MG tablet Take 1 tablet by mouth every night at bedtime 90 tablet 3  . blood glucose meter kit and supplies KIT Dispense based on patient and insurance preference. Use up to four times daily as directed. (FOR ICD E11.65) 1 each 0  . Blood Glucose Monitoring Suppl (ONE TOUCH ULTRA SYSTEM KIT) w/Device KIT Use to monitor FSBS 4x daily for fluctuating blood sugars. Dx: E11.65. 1 each 1  . clopidogrel (PLAVIX) 75 MG tablet Take 1 tablet by mouth  every day 90 tablet 3  . diltiazem (CARDIZEM) 30 MG tablet Take 1 tablet (30 mg total) by mouth every 6 (six) hours. 360 tablet 1  . furosemide (LASIX) 20 MG tablet Take 1 tablet (20 mg total) by mouth daily as needed for fluid (fluid). (Patient  taking differently: Take 20 mg by mouth daily. ) 90 tablet 3  . glucose blood test strip Use to monitor FSBS 4x daily for fluctuating blood sugars. Dx: E11.65. 400 each 4  . insulin aspart (NOVOLOG) 100 UNIT/ML injection 10 UNITS WITH BREAKFAST AND 10 UNITS WITH LUNCH, 30 UNITS WITH SUPPER (Patient taking differently: Inject 10-30 Units into the skin See admin instructions. 10 UNITS WITH BREAKFAST AND 10 UNITS WITH LUNCH, 30 UNITS WITH SUPPER) 60 mL 4  . insulin glargine (LANTUS) 100 UNIT/ML injection Inject 0.6 mLs (60 Units total) into the skin at bedtime. (Patient taking differently: Inject 50 Units into the skin at bedtime. ) 60 mL 3  . isosorbide mononitrate (IMDUR) 60 MG 24 hr tablet Take 60 mg by mouth daily.    . levothyroxine (SYNTHROID, LEVOTHROID) 125 MCG tablet Take 1 tablet (125 mcg total) by mouth daily. 90 tablet 3  . Menthol, Topical Analgesic, (BIOFREEZE EX) Apply 1   application topically as needed (for pain).    . metFORMIN (GLUCOPHAGE) 850 MG tablet Take 1 tablet by mouth  twice a day 180 tablet 3  . metoprolol (TOPROL XL) 200 MG 24 hr tablet Take 1 tablet (200 mg total) by mouth daily. 90 tablet 3  . nitroGLYCERIN (NITROSTAT) 0.4 MG SL tablet Place 1 tablet (0.4 mg total) under the tongue every 5 (five) minutes as needed for chest pain. 25 tablet 1  . SYRINGE/NEEDLE, DISP, 1 ML (B-D SYRINGE/NEEDLE 1CC/25GX5/8) 25G X 5/8" 1 ML MISC As directed 100 each 5  . tiotropium (SPIRIVA) 18 MCG inhalation capsule Place 18 mcg into inhaler and inhale daily. Reported on 02/25/2016    . zolpidem (AMBIEN) 10 MG tablet Take 1 tablet (10 mg total) by mouth at bedtime. 90 tablet 1   No current facility-administered medications on file prior to visit.    Allergies  Allergen Reactions  . Niaspan [Niacin] Itching  . Ofev [Nintedanib] Other (See Comments)    Lethargic, wiped out  . Pirfenidone   . Altace [Ramipril] Other (See Comments)    Cough    Social History   Social History  . Marital  status: Single    Spouse name: N/A  . Number of children: 1  . Years of education: N/A   Occupational History  .  [employer]   Social History Main Topics  . Smoking status: Former Smoker    Packs/day: 1.50    Years: 35.00    Types: Cigarettes    Quit date: 10/24/2007  . Smokeless tobacco: Never Used  . Alcohol use 0.0 oz/week     Comment: 04/15/2015 "I've had a couple drinks in the last 8 months"  . Drug use: No  . Sexual activity: Not Currently   Other Topics Concern  . Not on file   Social History Narrative  . No narrative on file     Review of Systems  All other systems reviewed and are negative.      Objective:   Physical Exam  Constitutional: He appears well-developed and well-nourished.  Neck: No JVD present.  Cardiovascular: Regular rhythm and normal heart sounds.  Bradycardia present.   Pulmonary/Chest: He has wheezes.  Abdominal: Soft. Bowel sounds are normal. He exhibits no distension. There is no tenderness. There is no rebound.  Musculoskeletal: He exhibits edema.  Vitals reviewed.         Assessment & Plan:  Irregular heart rate - Plan: EKG 12-Lead  Uncontrolled type 2 diabetes mellitus with hyperosmolarity without coma, with long-term current use of insulin (HCC)  Shortness of breath  Essential hypertension  Pulmonary fibrosis (HCC)  Overall I am pleased with his blood sugars. I recommended increasing his aerobic exercises much as tolerated and trying to lose 5-10 pounds to achieve a goal hemoglobin A1c less than 7. Given the severity of his lung issues, I believe that at the present time his blood sugar control is adequate. His blood pressure today is well controlled. I am concerned by his bradycardia as evidenced on his EKG. I have recommended decreasing his Toprol-XL to 100 mg a day and recheck his heart rate next week. His cholesterol is certainly at goal.

## 2016-08-17 DIAGNOSIS — E119 Type 2 diabetes mellitus without complications: Secondary | ICD-10-CM | POA: Diagnosis not present

## 2016-08-17 DIAGNOSIS — H2513 Age-related nuclear cataract, bilateral: Secondary | ICD-10-CM | POA: Diagnosis not present

## 2016-08-17 LAB — HM DIABETES EYE EXAM

## 2016-08-24 ENCOUNTER — Telehealth: Payer: Self-pay | Admitting: Family Medicine

## 2016-08-24 NOTE — Telephone Encounter (Signed)
Pt called and states that you wanted him to keep check on his pulse and he checked it today and it was 65. Just wanted to let you know

## 2016-08-30 ENCOUNTER — Other Ambulatory Visit: Payer: Self-pay | Admitting: Family Medicine

## 2016-08-30 MED ORDER — ZOLPIDEM TARTRATE 10 MG PO TABS: 10.0000 mg | ORAL_TABLET | Freq: Every day | ORAL | 1 refills | Status: DC

## 2016-09-07 ENCOUNTER — Other Ambulatory Visit: Payer: Self-pay | Admitting: Family Medicine

## 2016-09-07 MED ORDER — ZOLPIDEM TARTRATE 10 MG PO TABS: 10.0000 mg | ORAL_TABLET | Freq: Every day | ORAL | 1 refills | Status: DC

## 2016-09-09 DIAGNOSIS — J449 Chronic obstructive pulmonary disease, unspecified: Secondary | ICD-10-CM | POA: Diagnosis not present

## 2016-09-09 DIAGNOSIS — R269 Unspecified abnormalities of gait and mobility: Secondary | ICD-10-CM | POA: Diagnosis not present

## 2016-09-09 DIAGNOSIS — J8410 Pulmonary fibrosis, unspecified: Secondary | ICD-10-CM | POA: Diagnosis not present

## 2016-09-09 DIAGNOSIS — J438 Other emphysema: Secondary | ICD-10-CM | POA: Diagnosis not present

## 2016-09-11 ENCOUNTER — Other Ambulatory Visit: Payer: Self-pay | Admitting: Family Medicine

## 2016-09-11 MED ORDER — INSULIN ASPART 100 UNIT/ML ~~LOC~~ SOLN: 10.0000 [IU] | SUBCUTANEOUS | 5 refills | Status: DC

## 2016-09-12 ENCOUNTER — Encounter: Payer: Self-pay | Admitting: Family Medicine

## 2016-09-26 ENCOUNTER — Ambulatory Visit (INDEPENDENT_AMBULATORY_CARE_PROVIDER_SITE_OTHER): Payer: PPO | Admitting: Internal Medicine

## 2016-09-26 ENCOUNTER — Encounter: Payer: Self-pay | Admitting: Internal Medicine

## 2016-09-26 VITALS — BP 94/60 | HR 67 | Ht 74.0 in | Wt 243.0 lb

## 2016-09-26 DIAGNOSIS — J9611 Chronic respiratory failure with hypoxia: Secondary | ICD-10-CM | POA: Diagnosis not present

## 2016-09-26 DIAGNOSIS — J84112 Idiopathic pulmonary fibrosis: Secondary | ICD-10-CM

## 2016-09-26 LAB — PULMONARY FUNCTION TEST
DL/VA % pred: 58 %
DL/VA: 2.8 ml/min/mmHg/L
DLCO UNC: 13.97 ml/min/mmHg
DLCO cor % pred: 35 %
DLCO cor: 13.47 ml/min/mmHg
DLCO unc % pred: 37 %
FEF 25-75 Pre: 1.9 L/sec
FEF2575-%PRED-PRE: 58 %
FEV1-%PRED-PRE: 64 %
FEV1-PRE: 2.63 L
FEV1FVC-%Pred-Pre: 99 %
FEV6-%PRED-PRE: 68 %
FEV6-Pre: 3.5 L
FEV6FVC-%PRED-PRE: 103 %
FVC-%Pred-Pre: 65 %
FVC-PRE: 3.53 L
PRE FEV1/FVC RATIO: 74 %
Pre FEV6/FVC Ratio: 99 %

## 2016-09-26 NOTE — Progress Notes (Signed)
Subjective:     Patient ID: Jerry Montgomery, male   DOB: 09/01/1956, 60 y.o.   MRN: [id number]  HPI OV 06/27/2016  Chief Complaint  Patient presents with  . Follow-up    no coughing, and no wheezing     Follow-up idiopathic pulmonary fibrosis. He is on oxygen. There is a routine follow-up. Overall stable. He lost a lot of weight but gained some weight back because his dog of 6 years died from diabetes month ago. He is still grieving significantly.nevertheless he is net weight loss. Of note he failed both anti-fibrotic therapies because of intolerance. He is exercising regularly.he is active in the pulmonary fibrosis foundation support group. Is a participant in the IPF registry sponsored by [facility] and able to the questionnaire today OV 09/26/2016  Chief Complaint  Patient presents with  . Follow-up    Pt here after PFT. Pt states his SOB is at baseline. Pt c/o anxiety and occassional cough with little mucus production.    Follow-up idiopathic pulmonary fibrosis with chronic hypoxemic respiratory failure. Visit three-month follow-up. He is doing stable. Uses room air to 1-2 liters at rest and 6-8 liters with exertion. This is stable. He has a new dog. He continues to take care of his wife. He is intolerant to all prior antifibrotic therapy therefore he is on expectant care. He has gained some weight. He is not interested in transplant because it was significant caretaker effort. He says his maintaining a positive attitude     Results for Jerry Montgomery, Jerry Montgomery (MRN [id number]) as of 09/26/2016 09:48  Ref. Range 03/09/2015 16:01 04/19/2016 16:43 09/26/2016 08:54  FVC-Pre Latest Units: L 3.48 3.42 3.53  FVC-%Pred-Pre Latest Units: % 64 63 65   Results for Jerry Montgomery, Jerry Montgomery (MRN [id number]) as of 09/26/2016 09:48  Ref. Range 03/09/2015 16:01 04/19/2016 16:43 09/26/2016 08:54  DLCO unc Latest Units: ml/min/mmHg 15.02 14.05 13.97  DLCO unc % pred Latest Units: % 39 37 37    has a past medical history of Acute  diastolic heart failure ([place]); Atrial fibrillation ([place]); Atrial tachycardia ([place]); CAD (coronary artery disease); COPD (chronic obstructive pulmonary disease) ([place]); Hyperlipidemia; Hypertension; Hypothyroidism; On home oxygen therapy; Pneumothorax on right (4/11); Postinflammatory pulmonary fibrosis ([place]); Sleep apnea; SVT (supraventricular tachycardia) ([place]); Type II diabetes mellitus ([place]); and WPW (Wolff-Parkinson-White syndrome).   reports that he quit smoking about 8 years ago. His smoking use included Cigarettes. He has a 52.50 pack-year smoking history. He has never used smokeless tobacco.  Past Surgical History:  Procedure Laterality Date  . BILATERAL VATS ABLATION Right   . CARDIAC CATHETERIZATION  05/03/2007   patent stents  . CARDIAC CATHETERIZATION N/A 05/18/2015   Procedure: Right Heart Cath;  Surgeon: Belva Crome, MD;  Location: [place] [facility];  Service: Cardiovascular;  Laterality: N/A;  . CORONARY ANGIOPLASTY WITH STENT PLACEMENT  03/05/2003; 2008; 01/13/2010   PCI & Stent  LAD & mid CX; stent to CX  (I've got 4 stents total" (04/15/2015)  . ELECTROPHYSIOLOGIC STUDY N/A 04/14/2015   Procedure: SVT Ablation;  Surgeon: Evans Lance, MD;  Location: [place] [facility];  Service: Cardiovascular;  Laterality: N/A;  . KNEE ARTHROSCOPY Right 01/22/2013   Procedure: RIGHT ARTHROSCOPY KNEE WITH DEBRIDEMENT, abrasion chondroplasty of lateral tibial plateau, debridement of partial tear of ACL, menisectomy;  Surgeon: Tobi Bastos, MD;  Location: [facility];  Service: Orthopedics;  Laterality: Right;  . KNEE SURGERY  2012   "reattached quad"  . LUNG LOBECTOMY Right   .  SHOULDER ARTHROSCOPY W/ ROTATOR CUFF REPAIR Bilateral   . VIDEO ASSISTED THORACOSCOPY Left 07/19/2015   Procedure: LEFT VIDEO ASSISTED THORACOSCOPY WITH LEFT UPPER AND LOWER LUNG BIOPSY;  Surgeon: Melrose Nakayama, MD;  Location: [place];  Service: Thoracic;  Laterality: Left;  Marland Kitchen VIDEO BRONCHOSCOPY N/A 07/19/2015    Procedure: VIDEO BRONCHOSCOPY WITH FLUORO;  Surgeon: Melrose Nakayama, MD;  Location: [place];  Service: Thoracic;  Laterality: N/A;    Allergies  Allergen Reactions  . Niaspan [Niacin] Itching  . Ofev [Nintedanib] Other (See Comments)    Lethargic, wiped out  . Pirfenidone   . Altace [Ramipril] Other (See Comments)    Cough     Immunization History  Administered Date(s) Administered  . Influenza Whole 07/23/2012  . Influenza,inj,Quad PF,36+ Mos 07/08/2015, 06/27/2016  . Influenza-Unspecified 07/16/2014  . Pneumococcal Conjugate-13 11/10/2013  . Pneumococcal Polysaccharide-23 07/08/2015    Family History  Problem Relation Age of Onset  . Breast cancer Mother   . Diabetes type II Mother   . Lung cancer Father      Current Outpatient Prescriptions:  .  ADVAIR HFA 115-21 MCG/ACT inhaler, Inhale 2 puffs into the lungs 2 (two) times daily. Reported on 02/25/2016, Disp: , Rfl:  .  albuterol (PROVENTIL HFA;VENTOLIN HFA) 108 (90 Base) MCG/ACT inhaler, Inhale 2 puffs into the lungs every 6 (six) hours as needed for wheezing., Disp: 3 Inhaler, Rfl: 3 .  ALPRAZolam (XANAX) 0.5 MG tablet, Take 1 tablet (0.5 mg total) by mouth 3 (three) times daily as needed for anxiety., Disp: 90 tablet, Rfl: 0 .  aspirin 81 MG tablet, Take 81 mg by mouth every morning. , Disp: , Rfl:  .  atorvastatin (LIPITOR) 40 MG tablet, Take 1 tablet by mouth every night at bedtime, Disp: 90 tablet, Rfl: 3 .  blood glucose meter kit and supplies KIT, Dispense based on patient and insurance preference. Use up to four times daily as directed. (FOR ICD E11.65), Disp: 1 each, Rfl: 0 .  Blood Glucose Monitoring Suppl (ONE TOUCH ULTRA SYSTEM KIT) w/Device KIT, Use to monitor FSBS 4x daily for fluctuating blood sugars. Dx: E11.65., Disp: 1 each, Rfl: 1 .  clopidogrel (PLAVIX) 75 MG tablet, Take 1 tablet by mouth  every day, Disp: 90 tablet, Rfl: 3 .  dapagliflozin propanediol (FARXIGA) 10 MG TABS tablet, Take 10 mg by mouth  daily., Disp: , Rfl:  .  diltiazem (CARDIZEM) 30 MG tablet, Take 1 tablet (30 mg total) by mouth every 6 (six) hours., Disp: 360 tablet, Rfl: 1 .  furosemide (LASIX) 20 MG tablet, Take 1 tablet (20 mg total) by mouth daily as needed for fluid (fluid). (Patient taking differently: Take 20 mg by mouth daily. ), Disp: 90 tablet, Rfl: 3 .  glucose blood test strip, Use to monitor FSBS 4x daily for fluctuating blood sugars. Dx: E11.65., Disp: 400 each, Rfl: 4 .  insulin aspart (NOVOLOG) 100 UNIT/ML injection, Inject 10-30 Units into the skin See admin instructions. 10 UNITS WITH BREAKFAST AND 10 UNITS WITH LUNCH, 30 UNITS WITH SUPPER, Disp: 60 mL, Rfl: 5 .  insulin glargine (LANTUS) 100 UNIT/ML injection, Inject 0.6 mLs (60 Units total) into the skin at bedtime. (Patient taking differently: Inject 50 Units into the skin at bedtime. ), Disp: 60 mL, Rfl: 3 .  isosorbide mononitrate (IMDUR) 60 MG 24 hr tablet, Take 60 mg by mouth daily., Disp: , Rfl:  .  levothyroxine (SYNTHROID, LEVOTHROID) 125 MCG tablet, Take 1 tablet (125 mcg total)  by mouth daily., Disp: 90 tablet, Rfl: 3 .  Menthol, Topical Analgesic, (BIOFREEZE EX), Apply 1 application topically as needed (for pain)., Disp: , Rfl:  .  metFORMIN (GLUCOPHAGE) 850 MG tablet, Take 1 tablet by mouth  twice a day, Disp: 180 tablet, Rfl: 3 .  metoprolol (TOPROL XL) 200 MG 24 hr tablet, Take 1 tablet (200 mg total) by mouth daily. (Patient taking differently: Take 100 mg by mouth daily. ), Disp: 90 tablet, Rfl: 3 .  nitroGLYCERIN (NITROSTAT) 0.4 MG SL tablet, Place 1 tablet (0.4 mg total) under the tongue every 5 (five) minutes as needed for chest pain., Disp: 25 tablet, Rfl: 1 .  SYRINGE/NEEDLE, DISP, 1 ML (B-D SYRINGE/NEEDLE 1CC/25GX5/8) 25G X 5/8" 1 ML MISC, As directed, Disp: 100 each, Rfl: 5 .  tiotropium (SPIRIVA) 18 MCG inhalation capsule, Place 18 mcg into inhaler and inhale daily. Reported on 02/25/2016, Disp: , Rfl:  .  zolpidem (AMBIEN) 10 MG tablet,  Take 1 tablet (10 mg total) by mouth at bedtime., Disp: 90 tablet, Rfl: 1     Review of Systems     Objective:   Physical Exam  Constitutional: He is oriented to person, place, and time. He appears well-developed and well-nourished. No distress.  HENT:  Head: Normocephalic and atraumatic.  Right Ear: External ear normal.  Left Ear: External ear normal.  Mouth/Throat: Oropharynx is clear and moist. No oropharyngeal exudate.  Eyes: Conjunctivae and EOM are normal. Pupils are equal, round, and reactive to light. Right eye exhibits no discharge. Left eye exhibits no discharge. No scleral icterus.  Neck: Normal range of motion. Neck supple. No JVD present. No tracheal deviation present. No thyromegaly present.  Cardiovascular: Normal rate, regular rhythm and intact distal pulses.  Exam reveals no gallop and no friction rub.   No murmur heard. Pulmonary/Chest: Effort normal. No respiratory distress. He has no wheezes. He has rales. He exhibits no tenderness.  Crackles at base  Abdominal: Soft. Bowel sounds are normal. He exhibits no distension and no mass. There is no tenderness. There is no rebound and no guarding.  Visceral obesity +  Musculoskeletal: Normal range of motion. He exhibits no edema or tenderness.  Lymphadenopathy:    He has no cervical adenopathy.  Neurological: He is alert and oriented to person, place, and time. He has normal reflexes. No cranial nerve deficit. Coordination normal.  Skin: Skin is warm and dry. No rash noted. He is not diaphoretic. No erythema. No pallor.  Psychiatric: He has a normal mood and affect. His behavior is normal. Judgment and thought content normal.  Nursing note and vitals reviewed.  Estimated body mass index is 31.2 kg/m as calculated from the following:   Height as of this encounter: 6' 2"  (1.88 m).   Weight as of this encounter: 243 lb (110.2 kg).  Vitals:   09/26/16 0940  BP: 94/60  Pulse: 67  SpO2: 95%  Weight: 243 lb (110.2 kg)   Height: 6' 2"  (1.88 m)       Assessment:       ICD-9-CM ICD-10-CM   1. IPF (idiopathic pulmonary fibrosis) (HCC) 516.31 J84.112   2. Chronic respiratory failure with hypoxia (HCC) 518.83 J96.11    799.02         Plan:      IPF appears stable Continue weight loss   - glad you got new dog Continue o2 and supportive care in setting of intolerance to all anti-fibrotic therapies Repeat spiromety and dlco in 3 months (  Pre-bd spiro and dlco only. No lung volume or bd response. No post-bd spiro) I wil inform Mr Hildred Alamin that you are not getting PFF emails  Followup - return to see me in 3 months por sooner if needed after spiro and dlco - at followup can discuss possible research studies   Dr. Brand Males, M.D., [facility].C.P Pulmonary and Critical Care Medicine Staff Physician [facility] Pager: [phone], If no answer or between  15:00h - 7:00h: call [phone]  09/26/2016 10:03 AM

## 2016-09-26 NOTE — Addendum Note (Signed)
Addended by: Garfield CorneaMABRY, JASMINE L on: 09/26/2016 12:46 PM   Modules accepted: Orders

## 2016-09-26 NOTE — Progress Notes (Signed)
pft  

## 2016-09-26 NOTE — Patient Instructions (Addendum)
ICD-9-CM ICD-10-CM   1. IPF (idiopathic pulmonary fibrosis) (HCC) 516.31 J84.112   2. Chronic respiratory failure with hypoxia (HCC) 518.83 J96.11    799.02       IPF appears stable Continue weight loss   - glad you got new dog Continue o2 and supportive care in setting of intolerance to all anti-fibrotic therapies Repeat spiromety and dlco in 3 months (Pre-bd spiro and dlco only. No lung volume or bd response. No post-bd spiro) I wil inform Mr Jerry Montgomery that you are not getting PFF emails  Followup - return to see me in 3 months por sooner if needed after spiro and dlco - at followup can discuss possible research studies

## 2016-10-09 DIAGNOSIS — R269 Unspecified abnormalities of gait and mobility: Secondary | ICD-10-CM | POA: Diagnosis not present

## 2016-10-09 DIAGNOSIS — J8410 Pulmonary fibrosis, unspecified: Secondary | ICD-10-CM | POA: Diagnosis not present

## 2016-10-09 DIAGNOSIS — J449 Chronic obstructive pulmonary disease, unspecified: Secondary | ICD-10-CM | POA: Diagnosis not present

## 2016-10-09 DIAGNOSIS — J438 Other emphysema: Secondary | ICD-10-CM | POA: Diagnosis not present

## 2016-10-10 ENCOUNTER — Other Ambulatory Visit: Payer: Self-pay

## 2016-10-10 DIAGNOSIS — J84112 Idiopathic pulmonary fibrosis: Secondary | ICD-10-CM

## 2016-10-20 ENCOUNTER — Other Ambulatory Visit: Payer: Self-pay | Admitting: Family Medicine

## 2016-10-26 ENCOUNTER — Other Ambulatory Visit: Payer: Self-pay | Admitting: Family Medicine

## 2016-10-26 DIAGNOSIS — E038 Other specified hypothyroidism: Secondary | ICD-10-CM

## 2016-11-08 ENCOUNTER — Other Ambulatory Visit: Payer: Self-pay | Admitting: Family Medicine

## 2016-11-09 DIAGNOSIS — J449 Chronic obstructive pulmonary disease, unspecified: Secondary | ICD-10-CM | POA: Diagnosis not present

## 2016-11-09 DIAGNOSIS — R269 Unspecified abnormalities of gait and mobility: Secondary | ICD-10-CM | POA: Diagnosis not present

## 2016-11-09 DIAGNOSIS — J8410 Pulmonary fibrosis, unspecified: Secondary | ICD-10-CM | POA: Diagnosis not present

## 2016-11-09 DIAGNOSIS — J438 Other emphysema: Secondary | ICD-10-CM | POA: Diagnosis not present

## 2016-11-27 ENCOUNTER — Other Ambulatory Visit: Payer: Self-pay | Admitting: Family Medicine

## 2016-11-27 DIAGNOSIS — Z794 Long term (current) use of insulin: Principal | ICD-10-CM

## 2016-11-27 DIAGNOSIS — E1165 Type 2 diabetes mellitus with hyperglycemia: Secondary | ICD-10-CM

## 2016-11-28 NOTE — Telephone Encounter (Signed)
Diabetic supplies refilled 

## 2016-11-29 ENCOUNTER — Other Ambulatory Visit: Payer: Self-pay | Admitting: Family Medicine

## 2016-11-29 MED ORDER — METOPROLOL SUCCINATE ER 100 MG PO TB24: 100.0000 mg | ORAL_TABLET | Freq: Every day | ORAL | 3 refills | Status: DC

## 2016-11-29 NOTE — Telephone Encounter (Signed)
Refill requested for Metoprolol - Medication called/sent to requested pharmacy

## 2016-12-10 DIAGNOSIS — J449 Chronic obstructive pulmonary disease, unspecified: Secondary | ICD-10-CM | POA: Diagnosis not present

## 2016-12-10 DIAGNOSIS — R269 Unspecified abnormalities of gait and mobility: Secondary | ICD-10-CM | POA: Diagnosis not present

## 2016-12-10 DIAGNOSIS — J438 Other emphysema: Secondary | ICD-10-CM | POA: Diagnosis not present

## 2016-12-10 DIAGNOSIS — J8410 Pulmonary fibrosis, unspecified: Secondary | ICD-10-CM | POA: Diagnosis not present

## 2016-12-22 ENCOUNTER — Ambulatory Visit (HOSPITAL_COMMUNITY)
Admission: RE | Admit: 2016-12-22 | Discharge: 2016-12-22 | Disposition: A | Discharge status: Home / Self Care | Payer: PPO | Source: Ambulatory Visit | Attending: Internal Medicine | Admitting: Internal Medicine

## 2016-12-22 DIAGNOSIS — J84112 Idiopathic pulmonary fibrosis: Secondary | ICD-10-CM | POA: Diagnosis not present

## 2016-12-22 LAB — PULMONARY FUNCTION TEST
DL/VA % pred: 62 %
DL/VA: 3.01 ml/min/mmHg/L
DLCO UNC % PRED: 38 %
DLCO unc: 14.52 ml/min/mmHg
FEF 25-75 PRE: 1.86 L/s
FEF2575-%Pred-Pre: 58 %
FEV1-%Pred-Pre: 63 %
FEV1-PRE: 2.57 L
FEV1FVC-%PRED-PRE: 99 %
FEV6-%PRED-PRE: 66 %
FEV6-Pre: 3.39 L
FEV6FVC-%PRED-PRE: 102 %
FVC-%Pred-Pre: 64 %
FVC-Pre: 3.45 L
PRE FEV1/FVC RATIO: 74 %
Pre FEV6/FVC Ratio: 98 %

## 2016-12-25 ENCOUNTER — Encounter: Payer: Self-pay | Admitting: Internal Medicine

## 2016-12-25 ENCOUNTER — Ambulatory Visit (INDEPENDENT_AMBULATORY_CARE_PROVIDER_SITE_OTHER): Payer: PPO | Admitting: Internal Medicine

## 2016-12-25 VITALS — BP 112/70 | HR 62 | Ht 74.0 in | Wt 246.4 lb

## 2016-12-25 DIAGNOSIS — J84112 Idiopathic pulmonary fibrosis: Secondary | ICD-10-CM

## 2016-12-25 DIAGNOSIS — Z006 Encounter for examination for normal comparison and control in clinical research program: Secondary | ICD-10-CM

## 2016-12-25 DIAGNOSIS — J9611 Chronic respiratory failure with hypoxia: Secondary | ICD-10-CM

## 2016-12-25 NOTE — Patient Instructions (Signed)
IPF (idiopathic pulmonary fibrosis) Clinically broadly stable disease since May 2016/June 2017   PLAN continue oxygen for support Appreciate continued participation in IPF registry study Continue to keep physically fit If any future IPF research protocols will  Let you know  Followup 4 months do Pre-bd spiro and dlco only.  No post-bd spiro Return to see me in 4 months or sooner if needed

## 2016-12-25 NOTE — Progress Notes (Signed)
IPF PRO Registry Purpose: To collect data and biological samples that will support future research studies.  Registry will describe current approaches to diagnosis and treatment of IPF, analyze participant characteristics to describe the natural history of the disease, assess quality of life, describe participants interactions with the health care system, describe IPF treatment practices across multiple institutions, and utilize biological samples linked to well characterized IPF participants to identify disease biomarkers.  --------------------------------------------------------------------------  Clinical Research Coordinator / Research RN note : This visit for [name] with DOB of Feb 20, 1956 on 12/25/2016 for the above protocol is 12 month follow-up visit and is for purpose of research . The consent for this encounter is under Protocol Version 31/Jan/2017 and is currently IRB approved. Subject expressed continued interest and consent in continuing as a study subject. Subject thanked for participation.    Subject required re-consenting due to amended ICF. Re-Consenting completed, refer to subjects paper source binder for documentation on the informed consent process checklist. PRO questionnaires were completed per protocol. Whole Blood RNA lab assessment was not completed due to a faulty specimen tube provided in the lab kit, tube did not have any vacuum suction. All other lab assessments were completed. A note to file explaining this has been filed in the subjects paper source binder.     Signed by [place] Bing, CMA, [place] Coordinator  [facility]  [place], [place] 12:54 PM 12/25/2016

## 2016-12-25 NOTE — Progress Notes (Signed)
Subjective:     Patient ID: Jerry Montgomery, male   DOB: 1955/12/01, 61 y.o.   MRN: [id number]  HPI    OV 12/25/2016  Chief Complaint  Patient presents with  . 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 He says he uses 6-8 L of oxygen continuously at rest and with exertion. There is no associated chest pain or edema or weight loss. He is not interested in pulmonary transplant . There are no new issues.   Results for Jerry Montgomery, Jerry Montgomery (MRN [id number]) as of 12/25/2016 10:29  Ref. Range 03/09/2015 16:01 04/19/2016 16:43 09/26/2016 08:54 12/22/2016 12:50  FVC-Pre Latest Units: L 3.48 3.42 3.53 3.45  FVC-%Pred-Pre Latest Units: % 64 63 65 64  Results for Jerry Montgomery, Jerry Montgomery (MRN [id number]) as of 12/25/2016 10:29  Ref. Range 03/09/2015 16:01 04/19/2016 16:43 09/26/2016 08:54 12/22/2016 12:50  DLCO unc Latest Units: ml/min/mmHg 15.02 14.05 13.97 14.52  DLCO unc % pred Latest Units: % 39 37 37 38       has a past medical history of Acute diastolic heart failure ([place]); Atrial fibrillation ([place]); Atrial tachycardia ([place]); CAD (coronary artery disease); COPD (chronic obstructive pulmonary disease) ([place]); Hyperlipidemia; Hypertension; Hypothyroidism; On home oxygen therapy; Pneumothorax on right (4/11); Postinflammatory pulmonary fibrosis ([place]); Sleep apnea; SVT (supraventricular tachycardia) ([place]); Type II diabetes mellitus ([place]); and WPW (Wolff-Parkinson-White syndrome).   reports that he quit smoking about 9 years ago. His smoking use included Cigarettes. He has a 52.50 pack-year smoking history. He has never used smokeless tobacco.  Past Surgical History:  Procedure Laterality Date  . BILATERAL VATS ABLATION Right   . CARDIAC CATHETERIZATION  05/03/2007   patent stents  . CARDIAC CATHETERIZATION N/A 05/18/2015   Procedure: Right Heart Cath;  Surgeon: Belva Crome, MD;  Location: [facility];  Service: Cardiovascular;  Laterality: N/A;  . CORONARY ANGIOPLASTY WITH STENT PLACEMENT  03/05/2003; 2008; 01/13/2010   PCI & Stent  LAD & mid CX; stent to CX  (I've got 4 stents total" (04/15/2015)  . ELECTROPHYSIOLOGIC STUDY N/A 04/14/2015   Procedure: SVT Ablation;  Surgeon: Evans Lance, MD;  Location: [facility];  Service: Cardiovascular;  Laterality: N/A;  . KNEE ARTHROSCOPY Right 01/22/2013   Procedure: RIGHT ARTHROSCOPY KNEE WITH DEBRIDEMENT, abrasion chondroplasty of lateral tibial plateau, debridement of partial tear of ACL, menisectomy;  Surgeon: Tobi Bastos, MD;  Location: [facility];  Service: Orthopedics;  Laterality: Right;  . KNEE SURGERY  2012   "reattached quad"  . LUNG LOBECTOMY Right   . SHOULDER ARTHROSCOPY W/ ROTATOR CUFF REPAIR Bilateral   . VIDEO ASSISTED THORACOSCOPY Left 07/19/2015   Procedure: LEFT VIDEO ASSISTED THORACOSCOPY WITH LEFT UPPER AND LOWER LUNG BIOPSY;  Surgeon: Melrose Nakayama, MD;  Location: [facility];  Service: Thoracic;  Laterality: Left;  Marland Kitchen VIDEO BRONCHOSCOPY N/A 07/19/2015   Procedure: VIDEO BRONCHOSCOPY WITH FLUORO;  Surgeon: Melrose Nakayama, MD;  Location: [facility];  Service: Thoracic;  Laterality: N/A;    Allergies  Allergen Reactions  . Niaspan [Niacin] Itching  . Ofev [Nintedanib] Other (See Comments)    Lethargic, wiped out  . Pirfenidone   . Altace [Ramipril] Other (See Comments)    Cough     Immunization History  Administered Date(s)  Administered  . Influenza Whole 07/23/2012  . Influenza,inj,Quad PF,36+ Mos 07/08/2015, 06/27/2016  . Influenza-Unspecified 07/16/2014  . Pneumococcal Conjugate-13 11/10/2013  . Pneumococcal Polysaccharide-23 07/08/2015    Family History  Problem Relation Age of Onset  . Breast cancer Mother   . Diabetes type II Mother   . Lung cancer Father       Current Outpatient Prescriptions:  .  ADVAIR HFA 115-21 MCG/ACT inhaler, Inhale 2 puffs into the lungs 2 (two) times daily. Reported on 02/25/2016, Disp: , Rfl:  .  albuterol (PROVENTIL HFA;VENTOLIN HFA) 108 (90 Base) MCG/ACT inhaler, Inhale 2 puffs into the lungs every 6 (six) hours as needed for wheezing., Disp: 3 Inhaler, Rfl: 3 .  ALPRAZolam (XANAX) 0.5 MG tablet, Take 1 tablet (0.5 mg total) by mouth 3 (three) times daily as needed for anxiety., Disp: 90 tablet, Rfl: 0 .  aspirin 81 MG tablet, Take 81 mg by mouth every morning. , Disp: , Rfl:  .  atorvastatin (LIPITOR) 40 MG tablet, Take 1 tablet by mouth every night at bedtime, Disp: 90 tablet, Rfl: 3 .  blood glucose meter kit and supplies KIT, Dispense based on patient and insurance preference. Use up to four times daily as directed. (FOR ICD E11.65), Disp: 1 each, Rfl: 0 .  Blood Glucose Monitoring Suppl (ONE TOUCH ULTRA 2) w/Device KIT, Use to monitor FSBS 4x daily for fluctuating blood sugars. Dx: E11.65., Disp: 1 each, Rfl: 0 .  Blood Glucose Monitoring Suppl (ONE TOUCH ULTRA SYSTEM KIT) w/Device KIT, Use to monitor FSBS 4x daily for fluctuating blood sugars. Dx: E11.65., Disp: 1 each, Rfl: 1 .  clopidogrel (PLAVIX) 75 MG tablet, Take 1 tablet by mouth  every day, Disp: 90 tablet, Rfl: 3 .  dapagliflozin propanediol (FARXIGA) 10 MG TABS tablet, Take 10 mg by mouth daily., Disp: , Rfl:  .  diltiazem (CARDIZEM) 30 MG tablet, Take 1 tablet (30 mg total) by mouth every 6 (six) hours., Disp: 360 tablet, Rfl: 1 .  furosemide (LASIX) 20 MG tablet, Take 1 tablet (20 mg total) by mouth daily as needed for fluid (fluid)., Disp: 90 tablet, Rfl: 2 .  insulin aspart (NOVOLOG) 100 UNIT/ML injection, Inject 10-30 Units into the skin See admin instructions. 10 UNITS WITH BREAKFAST AND 10 UNITS WITH LUNCH, 30 UNITS WITH SUPPER, Disp: 60 mL, Rfl: 5 .  insulin glargine (LANTUS) 100 UNIT/ML injection, Inject 0.6 mLs (60 Units total) into the skin at  bedtime. (Patient taking differently: Inject 50 Units into the skin at bedtime. ), Disp: 60 mL, Rfl: 3 .  isosorbide mononitrate (IMDUR) 60 MG 24 hr tablet, Take 60 mg by mouth daily., Disp: , Rfl:  .  levothyroxine (SYNTHROID, LEVOTHROID) 125 MCG tablet, Take 1 tablet (125 mcg total) by mouth daily., Disp: 90 tablet, Rfl: 2 .  Menthol, Topical Analgesic, (BIOFREEZE EX), Apply 1 application topically as needed (for pain)., Disp: , Rfl:  .  metFORMIN (GLUCOPHAGE) 850 MG tablet, Take 1 tablet by mouth  twice a day, Disp: 180 tablet, Rfl: 3 .  metoprolol succinate (TOPROL-XL) 100 MG 24 hr tablet, Take 1 tablet (100 mg total) by mouth daily. Take with or immediately following a meal., Disp: 90 tablet, Rfl: 3 .  nitroGLYCERIN (NITROSTAT) 0.4 MG SL tablet, Place 1 tablet (0.4 mg total) under the tongue every 5 (five) minutes as needed for chest pain., Disp: 25 tablet, Rfl: 1 .  ONE TOUCH ULTRA TEST test strip, Use to monitor FSBS 4x daily for fluctuating blood  sugars. Dx: E11.65., Disp: 400 each, Rfl: 3 .  ONETOUCH DELICA LANCETS FINE MISC, Test up to 4 times a day as directed., Disp: 100 each, Rfl: 5 .  SYRINGE/NEEDLE, DISP, 1 ML (B-D SYRINGE/NEEDLE 1CC/25GX5/8) 25G X 5/8" 1 ML MISC, As directed, Disp: 100 each, Rfl: 5 .  tiotropium (SPIRIVA) 18 MCG inhalation capsule, Place 18 mcg into inhaler and inhale daily. Reported on 02/25/2016, Disp: , Rfl:  .  zolpidem (AMBIEN) 10 MG tablet, Take 1 tablet (10 mg total) by mouth at bedtime., Disp: 90 tablet, Rfl: 1   Review of Systems     Objective:   Physical Exam  Constitutional: He is oriented to person, place, and time. He appears well-developed and well-nourished. No distress.  HENT:  Head: Normocephalic and atraumatic.  Right Ear: External ear normal.  Left Ear: External ear normal.  Mouth/Throat: Oropharynx is clear and moist. No oropharyngeal exudate.  o2 on  Eyes: Conjunctivae and EOM are normal. Pupils are equal, round, and reactive to light.  Right eye exhibits no discharge. Left eye exhibits no discharge. No scleral icterus.  Neck: Normal range of motion. Neck supple. No JVD present. No tracheal deviation present. No thyromegaly present.  Cardiovascular: Normal rate, regular rhythm and intact distal pulses.  Exam reveals no gallop and no friction rub.   No murmur heard. Pulmonary/Chest: Effort normal. No respiratory distress. He has no wheezes. He has rales. He exhibits no tenderness.  Abdominal: Soft. Bowel sounds are normal. He exhibits no distension and no mass. There is no tenderness. There is no rebound and no guarding.  Musculoskeletal: Normal range of motion. He exhibits no edema or tenderness.  Lymphadenopathy:    He has no cervical adenopathy.  Neurological: He is alert and oriented to person, place, and time. He has normal reflexes. No cranial nerve deficit. Coordination normal.  Skin: Skin is warm and dry. No rash noted. He is not diaphoretic. No erythema. No pallor.  Psychiatric: He has a normal mood and affect. His behavior is normal. Judgment and thought content normal.  Nursing note and vitals reviewed.  Vitals:   12/25/16 1020  BP: 112/70  Pulse: 62  SpO2: 94%  Weight: 246 lb 6.4 oz (111.8 kg)  Height: 6' 2"  (1.88 m)    Estimated body mass index is 31.64 kg/m as calculated from the following:   Height as of this encounter: 6' 2"  (1.88 m).   Weight as of this encounter: 246 lb 6.4 oz (111.8 kg).     Assessment:       ICD-9-CM ICD-10-CM   1. IPF (idiopathic pulmonary fibrosis) (HCC) 516.31 J84.112   2. Chronic respiratory failure with hypoxia (HCC) 518.83 J96.11    799.02         Plan:     IPF (idiopathic pulmonary fibrosis) Clinically broadly stable disease since May 2016/June 2017   PLAN continue oxygen for support Appreciate continued participation in IPF registry study Continue to keep physically fit If any future IPF research protocols will  Let you know  Followup 4 months do Pre-bd  spiro and dlco only.  No post-bd spiro Return to see me in 4 months or sooner if needed     Dr. Brand Males, M.D., [facility].C.P Pulmonary and Critical Care Medicine Staff Physician [facility] Pager: [phone], If no answer or between  15:00h - 7:00h: call [phone]  12/25/2016 10:41 AM

## 2016-12-25 NOTE — Assessment & Plan Note (Signed)
Clinically broadly stable disease since May 2016/June 2017   PLAN continue oxygen for support Appreciate continued participation in IPF registry study Continue to keep physically fit If any future IPF research protocols will  Let you know  Followup 4 months do Pre-bd spiro and dlco only.  No post-bd spiro Return to see me in 4 months or sooner if needed

## 2016-12-27 ENCOUNTER — Other Ambulatory Visit: Payer: Self-pay | Admitting: Family Medicine

## 2016-12-27 DIAGNOSIS — Z794 Long term (current) use of insulin: Principal | ICD-10-CM

## 2016-12-27 DIAGNOSIS — E1165 Type 2 diabetes mellitus with hyperglycemia: Secondary | ICD-10-CM

## 2017-01-07 DIAGNOSIS — R269 Unspecified abnormalities of gait and mobility: Secondary | ICD-10-CM | POA: Diagnosis not present

## 2017-01-07 DIAGNOSIS — J438 Other emphysema: Secondary | ICD-10-CM | POA: Diagnosis not present

## 2017-01-07 DIAGNOSIS — J8410 Pulmonary fibrosis, unspecified: Secondary | ICD-10-CM | POA: Diagnosis not present

## 2017-01-07 DIAGNOSIS — J449 Chronic obstructive pulmonary disease, unspecified: Secondary | ICD-10-CM | POA: Diagnosis not present

## 2017-01-26 ENCOUNTER — Other Ambulatory Visit: Payer: Self-pay | Admitting: Family Medicine

## 2017-01-26 NOTE — Telephone Encounter (Signed)
Refill appropriate 

## 2017-02-07 DIAGNOSIS — J438 Other emphysema: Secondary | ICD-10-CM | POA: Diagnosis not present

## 2017-02-07 DIAGNOSIS — R269 Unspecified abnormalities of gait and mobility: Secondary | ICD-10-CM | POA: Diagnosis not present

## 2017-02-07 DIAGNOSIS — J8410 Pulmonary fibrosis, unspecified: Secondary | ICD-10-CM | POA: Diagnosis not present

## 2017-02-07 DIAGNOSIS — J449 Chronic obstructive pulmonary disease, unspecified: Secondary | ICD-10-CM | POA: Diagnosis not present

## 2017-02-21 ENCOUNTER — Telehealth: Payer: Self-pay | Admitting: Family Medicine

## 2017-02-21 NOTE — Telephone Encounter (Signed)
[facility] requesting refill on Ambien - Ok to refill??

## 2017-02-22 MED ORDER — ZOLPIDEM TARTRATE 10 MG PO TABS: 10.0000 mg | ORAL_TABLET | Freq: Every day | ORAL | 1 refills | Status: DC

## 2017-02-22 NOTE — Telephone Encounter (Signed)
ok 

## 2017-02-22 NOTE — Telephone Encounter (Signed)
Rx faxed to [facility] at [phone]

## 2017-02-23 ENCOUNTER — Other Ambulatory Visit: Payer: Self-pay | Admitting: Family Medicine

## 2017-02-24 ENCOUNTER — Other Ambulatory Visit: Payer: Self-pay | Admitting: Family Medicine

## 2017-02-24 DIAGNOSIS — E1165 Type 2 diabetes mellitus with hyperglycemia: Secondary | ICD-10-CM

## 2017-02-24 DIAGNOSIS — Z794 Long term (current) use of insulin: Principal | ICD-10-CM

## 2017-03-09 DIAGNOSIS — J8410 Pulmonary fibrosis, unspecified: Secondary | ICD-10-CM | POA: Diagnosis not present

## 2017-03-09 DIAGNOSIS — R269 Unspecified abnormalities of gait and mobility: Secondary | ICD-10-CM | POA: Diagnosis not present

## 2017-03-09 DIAGNOSIS — J449 Chronic obstructive pulmonary disease, unspecified: Secondary | ICD-10-CM | POA: Diagnosis not present

## 2017-03-09 DIAGNOSIS — J438 Other emphysema: Secondary | ICD-10-CM | POA: Diagnosis not present

## 2017-03-16 ENCOUNTER — Ambulatory Visit (INDEPENDENT_AMBULATORY_CARE_PROVIDER_SITE_OTHER): Payer: PPO | Admitting: Family Medicine

## 2017-03-16 ENCOUNTER — Encounter: Payer: Self-pay | Admitting: Family Medicine

## 2017-03-16 VITALS — BP 110/60 | HR 92 | Temp 97.9°F | Resp 20 | Ht 73.0 in | Wt 236.0 lb

## 2017-03-16 DIAGNOSIS — H6121 Impacted cerumen, right ear: Secondary | ICD-10-CM | POA: Diagnosis not present

## 2017-03-16 DIAGNOSIS — H9191 Unspecified hearing loss, right ear: Secondary | ICD-10-CM

## 2017-03-16 NOTE — Progress Notes (Signed)
Subjective:    Patient ID: Jerry Montgomery, male    DOB: 1955/12/10, 61 y.o.   MRN: [id number]  HPI The patient reports tinnitus in his right ear for one month. This was gradually worsening. Approximately 2 weeks ago, he developed the sudden onset of hearing loss in his right ear as well as some mild discomfort. He denies any head injury or trauma to the ear. He denies any recent upper respiratory infection or sinus infection. He denies any rhinorrhea or sinus pain or sore throat. Past Medical History:  Diagnosis Date  . Acute diastolic heart failure ([place])   . Atrial fibrillation ([place])   . Atrial tachycardia ([place])   . CAD (coronary artery disease)   . COPD (chronic obstructive pulmonary disease) ([place])   . Hyperlipidemia   . Hypertension   . Hypothyroidism   . On home oxygen therapy    "3L; 24/7" (04/15/2015)  . Pneumothorax on right 4/11  . Postinflammatory pulmonary fibrosis ([place])   . Sleep apnea    "won't use CPAP" (04/14/2105)  . SVT (supraventricular tachycardia) ([place])    Per Dr. Tanna Furry note  . Type II diabetes mellitus ([place])   . WPW (Wolff-Parkinson-White syndrome)    Per Dr. Tanna Furry Note   Past Surgical History:  Procedure Laterality Date  . BILATERAL VATS ABLATION Right   . CARDIAC CATHETERIZATION  05/03/2007   patent stents  . CARDIAC CATHETERIZATION N/A 05/18/2015   Procedure: Right Heart Cath;  Surgeon: Belva Crome, MD;  Location: [facility];  Service: Cardiovascular;  Laterality: N/A;  . CORONARY ANGIOPLASTY WITH STENT PLACEMENT  03/05/2003; 2008; 01/13/2010   PCI & Stent  LAD & mid CX; stent to CX  (I've got 4 stents total" (04/15/2015)  . ELECTROPHYSIOLOGIC STUDY N/A 04/14/2015   Procedure: SVT Ablation;  Surgeon: Evans Lance, MD;  Location: [facility];  Service: Cardiovascular;  Laterality: N/A;  . KNEE ARTHROSCOPY Right 01/22/2013   Procedure: RIGHT ARTHROSCOPY KNEE WITH DEBRIDEMENT, abrasion chondroplasty of lateral tibial plateau, debridement of  partial tear of ACL, menisectomy;  Surgeon: Tobi Bastos, MD;  [facility]: [facility];  Service: Orthopedics;  Laterality: Right;  . KNEE SURGERY  2012   "reattached quad"  . LUNG LOBECTOMY Right   . SHOULDER ARTHROSCOPY W/ ROTATOR CUFF REPAIR Bilateral   . VIDEO ASSISTED THORACOSCOPY Left 07/19/2015   Procedure: LEFT VIDEO ASSISTED THORACOSCOPY WITH LEFT UPPER AND LOWER LUNG BIOPSY;  Surgeon: Melrose Nakayama, MD;  Location: [facility];  Service: Thoracic;  Laterality: Left;  Marland Kitchen VIDEO BRONCHOSCOPY N/A 07/19/2015   Procedure: VIDEO BRONCHOSCOPY WITH FLUORO;  Surgeon: Melrose Nakayama, MD;  [facility];  Service: Thoracic;  Laterality: N/A;   Current Outpatient Prescriptions on File Prior to Visit  Medication Sig Dispense Refill  . ADVAIR HFA 115-21 MCG/ACT inhaler Inhale 2 puffs into the lungs 2 (two) times daily. Reported on 02/25/2016    . albuterol (PROVENTIL HFA;VENTOLIN HFA) 108 (90 Base) MCG/ACT inhaler Inhale 2 puffs into the lungs every 6 (six) hours as needed for wheezing. 3 Inhaler 3  . ALPRAZolam (XANAX) 0.5 MG tablet Take 1 tablet (0.5 mg total) by mouth 3 (three) times daily as needed for anxiety. 90 tablet 0  . aspirin 81 MG tablet Take 81 mg by mouth every morning.     Marland Kitchen atorvastatin (LIPITOR) 40 MG tablet Take 1 tablet by mouth every night at bedtime 90 tablet 3  . blood glucose meter kit and supplies  KIT Dispense based on patient and insurance preference. Use up to four times daily as directed. (FOR ICD E11.65) 1 each 0  . Blood Glucose Monitoring Suppl (ONE TOUCH ULTRA 2) w/Device KIT Use to monitor FSBS 4x daily for fluctuating blood sugars. Dx: E11.65. 1 each 0  . Blood Glucose Monitoring Suppl (ONE TOUCH ULTRA SYSTEM KIT) w/Device KIT Use to monitor FSBS 4x daily for fluctuating blood sugars. Dx: E11.65. 1 each 1  . clopidogrel (PLAVIX) 75 MG tablet Take 1 tablet by mouth  every day 90 tablet 3  . dapagliflozin propanediol (FARXIGA) 10 MG TABS tablet Take 10 mg by mouth  daily.    Marland Kitchen diltiazem (CARDIZEM) 30 MG tablet Take 1 tablet (30 mg total) by mouth every 6 (six) hours. 360 tablet 1  . furosemide (LASIX) 20 MG tablet Take 1 tablet (20 mg total) by mouth daily as needed for fluid (fluid). 90 tablet 2  . insulin aspart (NOVOLOG) 100 UNIT/ML injection Inject 10-30 Units into the skin See admin instructions. 10 UNITS WITH BREAKFAST AND 10 UNITS WITH LUNCH, 30 UNITS WITH SUPPER 60 mL 5  . insulin glargine (LANTUS) 100 UNIT/ML injection Inject 0.6 mLs (60 Units total) into the skin at bedtime. (Patient taking differently: Inject 50 Units into the skin at bedtime. ) 60 mL 3  . isosorbide mononitrate (IMDUR) 60 MG 24 hr tablet Take 60 mg by mouth daily.    . isosorbide mononitrate (IMDUR) 60 MG 24 hr tablet Take 60 mg by mouth every day 90 tablet 2  . levothyroxine (SYNTHROID, LEVOTHROID) 125 MCG tablet Take 1 tablet (125 mcg total) by mouth daily. 90 tablet 2  . Menthol, Topical Analgesic, (BIOFREEZE EX) Apply 1 application topically as needed (for pain).    . metFORMIN (GLUCOPHAGE) 850 MG tablet Take 1 tablet by mouth twice a day 180 tablet 2  . metoprolol succinate (TOPROL-XL) 100 MG 24 hr tablet Take 1 tablet (100 mg total) by mouth daily. Take with or immediately following a meal. 90 tablet 3  . nitroGLYCERIN (NITROSTAT) 0.4 MG SL tablet Place 1 tablet (0.4 mg total) under the tongue every 5 (five) minutes as needed for chest pain. 25 tablet 1  . ONE TOUCH ULTRA TEST test strip Use to monitor FSBS 4x daily for fluctuating blood sugars. Dx: E11.65. 400 each 3  . ONETOUCH DELICA LANCETS FINE MISC Test up to 4 times a day as directed. 100 each 5  . SYRINGE/NEEDLE, DISP, 1 ML (B-D SYRINGE/NEEDLE 1CC/25GX5/8) 25G X 5/8" 1 ML MISC As directed 100 each 5  . tiotropium (SPIRIVA) 18 MCG inhalation capsule Place 18 mcg into inhaler and inhale daily. Reported on 02/25/2016    . zolpidem (AMBIEN) 10 MG tablet Take 1 tablet (10 mg total) by mouth at bedtime. 90 tablet 1   No  current facility-administered medications on file prior to visit.    Allergies  Allergen Reactions  . Niaspan [Niacin] Itching  . Ofev [Nintedanib] Other (See Comments)    Lethargic, wiped out  . Pirfenidone   . Altace [Ramipril] Other (See Comments)    Cough    Social History   Social History  . Marital status: Single    Spouse name: N/A  . Number of children: 1  . Years of education: N/A   Occupational History  .  [employer]   Social History Main Topics  . Smoking status: Former Smoker    Packs/day: 1.50    Years: 35.00    Types: Cigarettes  Quit date: 10/24/2007  . Smokeless tobacco: Never Used  . Alcohol use 0.0 oz/week     Comment: 04/15/2015 "I've had a couple drinks in the last 8 months"  . Drug use: No  . Sexual activity: Not Currently   Other Topics Concern  . Not on file   Social History Narrative  . No narrative on file      Review of Systems  All other systems reviewed and are negative.      Objective:   Physical Exam  Constitutional: He appears well-nourished. No distress.  HENT:  Right Ear: External ear normal. No drainage, swelling or tenderness. A foreign body is present. No middle ear effusion. Decreased hearing is noted.  Left Ear: Tympanic membrane, external ear and ear canal normal. No drainage, swelling or tenderness. Tympanic membrane is not injected, not scarred, not perforated, not erythematous, not retracted and not bulging.  No middle ear effusion. No decreased hearing is noted.  Nose: Nose normal.  Mouth/Throat: Oropharynx is clear and moist. No oropharyngeal exudate.  Cardiovascular: Normal rate, regular rhythm and normal heart sounds.   Pulmonary/Chest: Effort normal and breath sounds normal.  Skin: He is not diaphoretic.  Vitals reviewed.  Left ear is completely clear. There is a cerumen impaction in the right auditory canal preventing me from observing the right tympanic membrane       Assessment & Plan:  Acute  hearing loss of right ear  I suspect that this is due to the cerumen impaction in his right ear. Despite flushing the ear with hydrogen peroxide and repeated attempts at irrigation and lavage, we were unsuccessful in removing the cerumen impaction. We will consult ENT for assistance in removing the cerumen impaction.Marland Kitchen

## 2017-04-09 DIAGNOSIS — R269 Unspecified abnormalities of gait and mobility: Secondary | ICD-10-CM | POA: Diagnosis not present

## 2017-04-09 DIAGNOSIS — J449 Chronic obstructive pulmonary disease, unspecified: Secondary | ICD-10-CM | POA: Diagnosis not present

## 2017-04-09 DIAGNOSIS — J438 Other emphysema: Secondary | ICD-10-CM | POA: Diagnosis not present

## 2017-04-09 DIAGNOSIS — J8410 Pulmonary fibrosis, unspecified: Secondary | ICD-10-CM | POA: Diagnosis not present

## 2017-04-24 ENCOUNTER — Other Ambulatory Visit: Payer: Self-pay | Admitting: Family Medicine

## 2017-04-26 DIAGNOSIS — H6121 Impacted cerumen, right ear: Secondary | ICD-10-CM | POA: Insufficient documentation

## 2017-04-26 DIAGNOSIS — J343 Hypertrophy of nasal turbinates: Secondary | ICD-10-CM | POA: Diagnosis not present

## 2017-04-26 DIAGNOSIS — Z974 Presence of external hearing-aid: Secondary | ICD-10-CM | POA: Diagnosis not present

## 2017-04-26 DIAGNOSIS — H903 Sensorineural hearing loss, bilateral: Secondary | ICD-10-CM | POA: Insufficient documentation

## 2017-04-30 ENCOUNTER — Ambulatory Visit (INDEPENDENT_AMBULATORY_CARE_PROVIDER_SITE_OTHER): Payer: PPO | Admitting: Internal Medicine

## 2017-04-30 ENCOUNTER — Encounter: Payer: Self-pay | Admitting: Internal Medicine

## 2017-04-30 VITALS — BP 128/62 | HR 67 | Ht 74.0 in | Wt 227.0 lb

## 2017-04-30 DIAGNOSIS — J84112 Idiopathic pulmonary fibrosis: Secondary | ICD-10-CM

## 2017-04-30 DIAGNOSIS — J9611 Chronic respiratory failure with hypoxia: Secondary | ICD-10-CM

## 2017-04-30 LAB — PULMONARY FUNCTION TEST
DL/VA % pred: 58 %
DL/VA: 2.84 ml/min/mmHg/L
DLCO COR % PRED: 36 %
DLCO COR: 13.89 ml/min/mmHg
DLCO UNC % PRED: 38 %
DLCO UNC: 14.38 ml/min/mmHg
FEF 25-75 Pre: 2.03 L/sec
FEF2575-%Pred-Pre: 63 %
FEV1-%Pred-Pre: 67 %
FEV1-Pre: 2.71 L
FEV1FVC-%PRED-PRE: 100 %
FEV6-%Pred-Pre: 70 %
FEV6-Pre: 3.6 L
FEV6FVC-%Pred-Pre: 105 %
FVC-%Pred-Pre: 67 %
FVC-Pre: 3.61 L
PRE FEV1/FVC RATIO: 75 %
Pre FEV6/FVC Ratio: 100 %

## 2017-04-30 NOTE — Progress Notes (Signed)
Subjective:     Patient ID: Jerry Montgomery, male   DOB: Jul 10, 1956, 61 y.o.   MRN: [id number]  HPI    OV 04/30/2017  Chief Complaint  Patient presents with  . Follow-up    Pt here after PFT. Pt denies any changes in breathing since last OV. Pt states he has an intermittent dry cough - at baseline.     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 On basic supportive care after intolerance to both anti-fibrotic standard therapy. He is on IPF-BI registry  Since last visit he continues to do stable and well. He says that while he is at rest he can get away without oxygen. With exertion he might adjust himself to 4-8 liters of oxygen based on subjective means. Overall he feels stable. No new problems. We discussed lung transplant and is definitely not interested. Pulmonary function test is stable as documented below and over 2 years. He continues to participate in the research registry program. He is interested in further trials in IPF. He is supportive and participate in the patient's support group    Results for Jerry Montgomery (MRN [id number]) as of 04/30/2017 11:19  Ref. Range 03/09/2015 16:01 04/19/2016 16:43 09/26/2016 08:54 12/22/2016 12:50 04/30/2017 09:54  FVC-Pre Latest Units: L 3.48 3.42 3.53 3.45 3.61  FVC-%Pred-Pre Latest Units: % 64 63 65 64 67   Results for Jerry Montgomery (MRN [id number]) as of 04/30/2017 11:19  Ref. Range 03/09/2015 16:01 04/19/2016 16:43 09/26/2016 08:54 12/22/2016 12:50 04/30/2017 09:54  DLCO unc Latest Units: ml/min/mmHg 15.02 14.05 13.97 14.52 14.38  DLCO unc % pred Latest Units: % 39 37 37 38 38     has a past medical history of Acute diastolic heart failure ([place]); Atrial fibrillation ([place]); Atrial tachycardia ([place]); CAD (coronary artery disease); COPD (chronic obstructive pulmonary disease) ([place]); Hyperlipidemia; Hypertension; Hypothyroidism; On home oxygen therapy; Pneumothorax on  right (4/11); Postinflammatory pulmonary fibrosis ([place]); Sleep apnea; SVT (supraventricular tachycardia) ([place]); Type II diabetes mellitus ([place]); and WPW (Wolff-Parkinson-White syndrome).   reports that he quit smoking about 9 years ago. His smoking use included Cigarettes. He has a 52.50 pack-year smoking history. He has never used smokeless tobacco.  Past Surgical History:  Procedure Laterality Date  . BILATERAL VATS ABLATION Right   . CARDIAC CATHETERIZATION  05/03/2007   patent stents  . CARDIAC CATHETERIZATION N/A 05/18/2015   Procedure: Right Heart Cath;  Surgeon: Belva Crome, MD;  Location: [facility];  Service: Cardiovascular;  Laterality: N/A;  . CORONARY ANGIOPLASTY WITH STENT PLACEMENT  03/05/2003; 2008; 01/13/2010   PCI & Stent  LAD & mid CX; stent to CX  (I've got 4 stents total" (04/15/2015)  . ELECTROPHYSIOLOGIC STUDY N/A 04/14/2015   Procedure: SVT Ablation;  Surgeon: Evans Lance, MD;  Location: [facility];  Service: Cardiovascular;  Laterality: N/A;  . KNEE ARTHROSCOPY Right 01/22/2013   Procedure: RIGHT ARTHROSCOPY KNEE WITH DEBRIDEMENT, abrasion chondroplasty of lateral tibial plateau, debridement of partial tear of ACL, menisectomy;  Surgeon: Tobi Bastos, MD;  Location: [facility];  Service: Orthopedics;  Laterality: Right;  . KNEE SURGERY  2012   "reattached quad"  . LUNG LOBECTOMY Right   . SHOULDER ARTHROSCOPY W/ ROTATOR CUFF REPAIR Bilateral   . VIDEO ASSISTED THORACOSCOPY Left 07/19/2015   Procedure: LEFT VIDEO ASSISTED THORACOSCOPY WITH LEFT UPPER AND LOWER LUNG BIOPSY;  Surgeon: Melrose Nakayama, MD;  Location: [facility];  Service: Thoracic;  Laterality: Left;  Marland Kitchen VIDEO BRONCHOSCOPY N/A 07/19/2015   Procedure: VIDEO BRONCHOSCOPY WITH FLUORO;  Surgeon: Melrose Nakayama, MD;  Location: [facility];  Service: Thoracic;  Laterality: N/A;    Allergies  Allergen Reactions  . Niaspan [Niacin] Itching  . Ofev [Nintedanib] Other (See Comments)    Lethargic,  wiped out  . Pirfenidone   . Altace [Ramipril] Other (See Comments)    Cough     Immunization History  Administered Date(s) Administered  . Influenza Whole 07/23/2012  . Influenza,inj,Quad PF,36+ Mos 07/08/2015, 06/27/2016  . Influenza-Unspecified 07/16/2014  . Pneumococcal Conjugate-13 11/10/2013  . Pneumococcal Polysaccharide-23 07/08/2015    Family History  Problem Relation Age of Onset  . Breast cancer Mother   . Diabetes type II Mother   . Lung cancer Father      Current Outpatient Prescriptions:  .  ADVAIR HFA 115-21 MCG/ACT inhaler, Inhale 2 puffs into the lungs 2 (two) times daily. Reported on 02/25/2016, Disp: , Rfl:  .  albuterol (PROVENTIL HFA;VENTOLIN HFA) 108 (90 Base) MCG/ACT inhaler, Inhale 2 puffs into the lungs every 6 (six) hours as needed for wheezing., Disp: 3 Inhaler, Rfl: 3 .  ALPRAZolam (XANAX) 0.5 MG tablet, Take 1 tablet (0.5 mg total) by mouth 3 (three) times daily as needed for anxiety., Disp: 90 tablet, Rfl: 0 .  aspirin 81 MG tablet, Take 81 mg by mouth every morning. , Disp: , Rfl:  .  atorvastatin (LIPITOR) 40 MG tablet, Take 1 tablet by mouth every night at bedtime, Disp: 90 tablet, Rfl: 3 .  blood glucose meter kit and supplies KIT, Dispense based on patient and insurance preference. Use up to four times daily as directed. (FOR ICD E11.65), Disp: 1 each, Rfl: 0 .  Blood Glucose Monitoring Suppl (ONE TOUCH ULTRA 2) w/Device KIT, Use to monitor FSBS 4x daily for fluctuating blood sugars. Dx: E11.65., Disp: 1 each, Rfl: 0 .  Blood Glucose Monitoring Suppl (ONE TOUCH ULTRA SYSTEM KIT) w/Device KIT, Use to monitor FSBS 4x daily for fluctuating blood sugars. Dx: E11.65., Disp: 1 each, Rfl: 1 .  clopidogrel (PLAVIX) 75 MG tablet, Take 1 tablet by mouth every day, Disp: 90 tablet, Rfl: 3 .  dapagliflozin propanediol (FARXIGA) 10 MG TABS tablet, Take 10 mg by mouth daily., Disp: , Rfl:  .  diltiazem (CARDIZEM) 30 MG tablet, Take 1 tablet (30 mg total) by mouth  every 6 (six) hours., Disp: 360 tablet, Rfl: 1 .  furosemide (LASIX) 20 MG tablet, Take 1 tablet (20 mg total) by mouth daily as needed for fluid (fluid)., Disp: 90 tablet, Rfl: 2 .  insulin aspart (NOVOLOG) 100 UNIT/ML injection, Inject 10-30 Units into the skin See admin instructions. 10 UNITS WITH BREAKFAST AND 10 UNITS WITH LUNCH, 30 UNITS WITH SUPPER, Disp: 60 mL, Rfl: 5 .  insulin glargine (LANTUS) 100 UNIT/ML injection, Inject 0.6 mLs (60 Units total) into the skin at bedtime. (Patient taking differently: Inject 50 Units into the skin at bedtime. ), Disp: 60 mL, Rfl: 3 .  isosorbide mononitrate (IMDUR) 60 MG 24 hr tablet, Take 60 mg by mouth every day, Disp: 90 tablet, Rfl: 2 .  levothyroxine (SYNTHROID, LEVOTHROID) 125 MCG tablet, Take 1 tablet (125 mcg total) by mouth daily., Disp: 90 tablet, Rfl: 2 .  Menthol, Topical Analgesic, (BIOFREEZE EX), Apply 1 application topically as needed (for pain)., Disp: , Rfl:  .  metFORMIN (GLUCOPHAGE) 850 MG tablet, Take 1 tablet by mouth twice a day, Disp:  180 tablet, Rfl: 2 .  metoprolol succinate (TOPROL-XL) 100 MG 24 hr tablet, Take 1 tablet (100 mg total) by mouth daily. Take with or immediately following a meal., Disp: 90 tablet, Rfl: 3 .  nitroGLYCERIN (NITROSTAT) 0.4 MG SL tablet, Place 1 tablet (0.4 mg total) under the tongue every 5 (five) minutes as needed for chest pain., Disp: 25 tablet, Rfl: 1 .  ONE TOUCH ULTRA TEST test strip, Use to monitor FSBS 4x daily for fluctuating blood sugars. Dx: E11.65., Disp: 400 each, Rfl: 3 .  ONETOUCH DELICA LANCETS FINE MISC, Test up to 4 times a day as directed., Disp: 100 each, Rfl: 5 .  SYRINGE/NEEDLE, DISP, 1 ML (B-D SYRINGE/NEEDLE 1CC/25GX5/8) 25G X 5/8" 1 ML MISC, As directed, Disp: 100 each, Rfl: 5 .  tiotropium (SPIRIVA) 18 MCG inhalation capsule, Place 18 mcg into inhaler and inhale daily. Reported on 02/25/2016, Disp: , Rfl:  .  zolpidem (AMBIEN) 10 MG tablet, Take 1 tablet (10 mg total) by mouth at  bedtime., Disp: 90 tablet, Rfl: 1    Review of Systems     Objective:   Physical Exam  Constitutional: He is oriented to person, place, and time. He appears well-developed and well-nourished. No distress.  HENT:  Head: Normocephalic and atraumatic.  Right Ear: External ear normal.  Left Ear: External ear normal.  Mouth/Throat: Oropharynx is clear and moist. No oropharyngeal exudate.  o2 on  Eyes: Conjunctivae and EOM are normal. Pupils are equal, round, and reactive to light. Right eye exhibits no discharge. Left eye exhibits no discharge. No scleral icterus.  Neck: Normal range of motion. Neck supple. No JVD present. No tracheal deviation present. No thyromegaly present.  Cardiovascular: Normal rate, regular rhythm and intact distal pulses.  Exam reveals no gallop and no friction rub.   No murmur heard. Pulmonary/Chest: Effort normal. No respiratory distress. He has no wheezes. He has rales. He exhibits no tenderness.  Bilateral LL crackles +  Abdominal: Soft. Bowel sounds are normal. He exhibits no distension and no mass. There is no tenderness. There is no rebound and no guarding.  Musculoskeletal: Normal range of motion. He exhibits no edema or tenderness.  Lymphadenopathy:    He has no cervical adenopathy.  Neurological: He is alert and oriented to person, place, and time. He has normal reflexes. No cranial nerve deficit. Coordination normal.  Skin: Skin is warm and dry. No rash noted. He is not diaphoretic. No erythema. No pallor.  Psychiatric: He has a normal mood and affect. His behavior is normal. Judgment and thought content normal.  Nursing note and vitals reviewed.   Vitals:   04/30/17 1046  BP: 128/62  Pulse: 67  SpO2: 98%  Weight: 227 lb (103 kg)  Height: 6' 2"  (1.88 m)    Estimated body mass index is 29.15 kg/m as calculated from the following:   Height as of this encounter: 6' 2"  (1.88 m).   Weight as of this encounter: 227 lb (103 kg).      Assessment:        ICD-10-CM   1. IPF (idiopathic pulmonary fibrosis) ([place]) J84.112   2. Chronic respiratory failure with hypoxia ([facility]) J96.11        Plan:       Lung diseas IPF is stable Respect desire not to undergo lung transplant Glad to see you fit and lost weight  Plan Continue supportive care without anti-fibrotic drugs due to intolerance Cotninue o2 as before Continue IPF - BI registry participation  Will refer to [facility] for future trials in IPF; there might be one upcoming Please talk to PCP [name], MD -  and ensure you get  Or discuss shingarix vaccine   Followup 6 months or sooner if needed    (> 50% of this 15 min visit spent in face to face counseling or/and coordination of care)   Dr. Brand Males, M.D., [facility].C.P Pulmonary and Critical Care Medicine Staff Physician [facility] Pager: [phone], If no answer or between  15:00h - 7:00h: call [phone]  04/30/2017 11:42 AM

## 2017-04-30 NOTE — Patient Instructions (Signed)
ICD-10-CM   1. IPF (idiopathic pulmonary fibrosis) (H[facility]) J84.112   2. Chronic respiratory failure with hypoxia ([facility]) J96.11     Lung diseas IPF is stable Respect desire not to undergo lung transplant Glad to see you fit and lost weight  Plan Continue supportive care without anti-fibrotic drugs due to intolerance Cotninue o2 as before Continue IPF - BI registry participation Will refer to [facility] for future trials in IPF; there might be one upcoming Please talk to PCP Jerry Montgomery, Jerry Montgomery, Jerry Montgomery -  and ensure you get  Or discuss shingarix vaccine   Followup 6 months or sooner if needed

## 2017-04-30 NOTE — Progress Notes (Signed)
PFT done today. 

## 2017-05-09 DIAGNOSIS — J8410 Pulmonary fibrosis, unspecified: Secondary | ICD-10-CM | POA: Diagnosis not present

## 2017-05-09 DIAGNOSIS — R269 Unspecified abnormalities of gait and mobility: Secondary | ICD-10-CM | POA: Diagnosis not present

## 2017-05-09 DIAGNOSIS — J449 Chronic obstructive pulmonary disease, unspecified: Secondary | ICD-10-CM | POA: Diagnosis not present

## 2017-05-09 DIAGNOSIS — J438 Other emphysema: Secondary | ICD-10-CM | POA: Diagnosis not present

## 2017-05-13 ENCOUNTER — Other Ambulatory Visit: Payer: Self-pay | Admitting: Family Medicine

## 2017-06-09 DIAGNOSIS — J438 Other emphysema: Secondary | ICD-10-CM | POA: Diagnosis not present

## 2017-06-09 DIAGNOSIS — R269 Unspecified abnormalities of gait and mobility: Secondary | ICD-10-CM | POA: Diagnosis not present

## 2017-06-09 DIAGNOSIS — J8410 Pulmonary fibrosis, unspecified: Secondary | ICD-10-CM | POA: Diagnosis not present

## 2017-06-09 DIAGNOSIS — J449 Chronic obstructive pulmonary disease, unspecified: Secondary | ICD-10-CM | POA: Diagnosis not present

## 2017-06-28 ENCOUNTER — Encounter: Payer: PPO | Admitting: Internal Medicine

## 2017-06-28 ENCOUNTER — Encounter: Payer: Self-pay | Admitting: Internal Medicine

## 2017-06-28 ENCOUNTER — Encounter (INDEPENDENT_AMBULATORY_CARE_PROVIDER_SITE_OTHER): Payer: PPO | Admitting: Internal Medicine

## 2017-06-28 ENCOUNTER — Ambulatory Visit (INDEPENDENT_AMBULATORY_CARE_PROVIDER_SITE_OTHER): Payer: PPO | Admitting: Internal Medicine

## 2017-06-28 VITALS — BP 92/66 | HR 66 | Temp 98.1°F | Resp 20 | Ht 72.0 in | Wt 217.8 lb

## 2017-06-28 DIAGNOSIS — J84112 Idiopathic pulmonary fibrosis: Secondary | ICD-10-CM

## 2017-06-28 DIAGNOSIS — Z006 Encounter for examination for normal comparison and control in clinical research program: Secondary | ICD-10-CM

## 2017-06-28 LAB — PULMONARY FUNCTION TEST
DL/VA % PRED: 62 %
DL/VA: 2.98 ml/min/mmHg/L
DLCO COR % PRED: 43 %
DLCO COR: 15.08 ml/min/mmHg
DLCO UNC % PRED: 44 %
DLCO unc: 15.53 ml/min/mmHg
FEF 25-75 PRE: 2.25 L/s
FEF2575-%PRED-PRE: 75 %
FEV1-%PRED-PRE: 74 %
FEV1-PRE: 2.8 L
FEV1FVC-%Pred-Pre: 102 %
FEV6-%Pred-Pre: 75 %
FEV6-Pre: 3.59 L
FEV6FVC-%PRED-PRE: 104 %
FVC-%PRED-PRE: 73 %
FVC-PRE: 3.65 L
PRE FEV1/FVC RATIO: 77 %
Pre FEV6/FVC Ratio: 99 %
RV % pred: 55 %
RV: 1.34 L
TLC % pred: 69 %
TLC: 5.13 L

## 2017-06-28 NOTE — Progress Notes (Signed)
.[email or web address]                                                                                                                                                                                     Title: A randomized, Phase 2, Open-Label, Multicenter Study to Evaluate the Safety, Tolerability and Activity of KD025 in Subjects with Idiopathic Pulmonary  Fibrosis (IPF). Protocol: IZ124-580. Clinical Trials #: [id number]. Sponsor: [facility] [place] [id number]  Synopsis: Eligible IPF subjects will take 475m of KD025 qday compared to [place]. The latter is essentially subjects not on currently approved approved anti-fibrotic therapy.  Subjects will be randomized either into group one and will receive KD025 400 mg daily x 24 weeks or Group two and will receive BSC but undergo same procedures and assessments as subjects receiving KD025. Group one subjects may continue to receive therapy with KD025 as long as no safety signal/clinical progress continues. Subjects receiving BSC have the option of switching to therapy with KD025 after week 24.  A decline in FVC of 5-10% indicates prognostic relevant progression. No subject will receive more than 96 weeks of treatment with KD025 in this study.   Key Features of KD025: KD025 is a selective ROCK2 inhibitor that impacts T helper (Th17) immune response, actin/myosin, cytoskeleton, and collagen systems. It that plays a role in autoimmunity and fibrotic states.  KD025 has a half life of approximately 5-6 hours with > 90% fecal excretion. It must be taken with meal or within 5 minutes of meal.   Key Inclusion Criteria:  IPF diagnosis within 5 years before study entry  PFT criteria Resting SpO2 >/= 88% with or without supplemental oxygen, Resting SpO2 >/= 88% with or without supplemental oxygen  FVC% >/= 50% FeV1/FVC ratio >/= 0.7 Residual Volume < 120% DLco >/= 30%   Effective Birth Control See I/E booklet for specifics    Have adequate bone marrow function: ANC > 1500/mm3 Hemoglobin > 9.0g/L Platelets > 100,000/mm3  Patient SHOULD NOT be on anti-fibrotic (esbriet or ofev) therapy for IPF Has either received pirfenidone and/or nintedanib or has been offered both treatments (with last dose administered at least 1 month before the expected start of study drug dosing).  If either or both pirfenidone and nintedanib treatment has not been given, then documentation that the patient was offered both treatments must be documented.  Key Exclusion Criteria:   Severe concomitant illness limiting life expectancy (< 1 year).  Pulmonary or upper respiratory tract infection within 4 weeks before study entry.  Chronic heart failure with New York Heart Association class III/IV or known left ventricular ejection fraction < 25%  Moderate to  severe hepatic impairment (ie. Child-Pugh Class B or C).  Estimated creatinine clearance < 42m/min  Aspartate aminotransferase (AST) and/or ALT > 2.0 x upper limit of normal (ULN)  Systolic blood pressure < 1018mg  Men whose partner is pregnant or breastfeeding  Current drug or alcohol dependence.  Chronic treatment with the following drugs (within 4 weeks of study entry and during the study) Immunosuppressive or cytotoxic drugs including cyclophosphamide and azathioprine.  Antifibrotic drugs including pirfenidone, nintedanib, D penicillamine, colchicine, tumor necrosis factor alpha blockers, imatinib and interferon- gamma.  Chronic use of N-acetylcysteine prescribed for IPF ( > 600 mg/day).  Oral anticoagulants prescribed for IPF  Treatment with endothelin receptor antagonists within 4 weeks before study entry Systemic treatment with in 4 weeks before study entry with cyclosporine A or tacrolimus, everolimus, or sirolimus (calcineurin or mammalian target of rapamycin inhibitors)  Subject is taking a medication that has the potential for QTc prolongation (see Appendix E) Subject is taking  a drug that is sensitive substrate of CYP enzymes.   Key Medications Interactions (Appendix E):   Use with Caution: alprazolam, diazepam, atorvastatin,simvastatin,chlor-pheniramine, astemizole, amlodipine, phenobarbital, phenytoin, hydrocortisone, St. John's Wort  Use with EXTREME caution: Fluconazole, ketoconazole, diltiazem, verapamil, erythromycin*, clarithromycin*, montelukast, grapefruit juice               SHOULD BE AVOIDED**: Quinidine, amiodarone *discuss with medical monitor first; **potential for prolonged qTcF  Safety Data: - IB 8.0 - July 2018 Pooled data Section 5.4 and Table 6-1: 269 subjects across various indications shows drug  to be generally well tolerated, mild-moderate AEs, and a  Low SAE rate.   >/=  5% side effects is Increased ALT - 10.8% (1.5% Gr3+), Increased AST 7.8% (1.1% Gr 3+), Increased GGT 7.8% (2.6% Gr 3+), Nausea 9.7% (0% Gr 3+), Diarrhea 8.9% (0.7% Gr3+), URTI 8.9%, Fatigue 7.8% (0% Gr 3+), Headache 6.3%, Cough 6.3%, Dyspnea 5%.   Majority are mild-moderate in severity.  To date all side effects attenuate within 3-4 of Rx start and rarely lead to discontinuation. They are also generally manageable with std prophylactic agents and dose modifications. Rx emergent increases in LFT have been reversible and NO subject has met Hy's law criteria (though 400 mg bid dose is no longer in use due to LFT risk).   Regarding SAEs - total 75 subjects experienced SAEs but SAEs experienced by >/= 3 subjects (1%)  was pneumonia 1.9%, dyspnea 1.5%, ILI 1.11% and chf 1.1%.  No death attributable to drug has been reported   Specifically with IPF population (current study so far)  Overall Safety Profile KD025(N=35) BSC (N=14)  Any severity AE  30 (86%) 11 (79%)   Grade 3+ AE 10 (29%) 2 (14%)  Grade 3+ AE due to drug 0 (0%) 0 (0%)  Discontinued due to AE 7 (20%) 1 (7%)  Discontinue due to AE                due to drug 1 (3%) - LFT  0 (0%)  SAE due to drug  0 (0%)   0 (0%)                     AEs>/= 10% - any severity    Increased Liver Enzymes 7 (20%) 2 (14%)   Cough 5 (15%) 4 (29%)  Fatigue 6 (17%) 1 (7%)  Dyspnea  4 (11%)  3 (21%)  Diarrhea 5 (15%)  0  Nausea 5 (14%) 2 (14%)  Dizziness 4 (11%) 1 (7%)  Upper Respiratory Infection 2 (6%) 2 (14%)  Muscle Spasms 4 (11%) 0                                              AEs in >/=2 subjects    Acute Respiratory Failure 2 (6%) 0 (0%)   Pneumonia 2 (6%) 0 (0%)  Dyspnea 2 (6%) 0 (0%)  CHF 2 (6%)  0 (0%)  CAD 2 (6%) 1 (7%)  Troponin increase  2 (6%)    0 (0%)      All patients treated with KD025 (including 9 crossovers)  Clinical Research officer, political party / Research RN note : This visit for [name] with DOB: 1955/12/06 on 06/28/2017 for the above protocol is Visit/Encounter # Screening and is for purpose of research. The consent for this encounter is under Protocol Version Amendment 7 and is  currently IRB approved. Subject expressed continued interest and consent in continuing as a study subject. Subject confirmed that there was no  change in contact information (e.g. address, telephone, email). Subject thanked for participation in research and contribution to science.   In this visit 06/28/2017 the subject will be evaluated by investigator named Dr. Chase Caller. This research coordinator has verified that the investigator is  uptodate with his/her training logs  Because this visit is a key visit of screening this visit is under direct supervision of the [place]. This PI is available for this visit.   Subject was consented to participate in the above mentioned protocol at 0930 on 06/Sep/18. Refer to the subjects paper source binder for the informed consent documentation checklist.   After consent obtained study procedures were conducted per protocol. Refer to the subjects paper source binder for further details and documentation.     Signed by [place] Bing, [place] Coordinator   [facility]  [place], [place] 7:24 PM 06/28/2017

## 2017-06-28 NOTE — Progress Notes (Addendum)
Title: A randomized, Phase 2, Open-Label, Multicenter Study to Evaluate the Safety, Tolerability and Activity of KD025 in Subjects with Idiopathic Pulmonary  Fibrosis (IPF). Protocol: KC127-517. Clinical Trials #: [id number]. Sponsor: [facility] [place] [address]  Synopsis: Eligible IPF subjects will take 477m of KD025 qday compared to [place]([facility]. The latter is essentially subjects not on currently approved approved anti-fibrotic therapy.  Subjects will be randomized either into group one and will receive KD025 400 mg daily x 24 weeks or Group two and will receive BSC but undergo same procedures and assessments as subjects receiving KD025. Group one subjects may continue to receive therapy with KD025 as long as no safety signal/clinical progress continues. Subjects receiving BSC have the option of switching to therapy with KD025 after week 24.  A decline in FVC of 5-10% indicates prognostic relevant progression. No subject will receive more than 96 weeks of treatment with KD025 in this study.   Key Features of KD025: KD025 is a selective ROCK2 inhibitor that impacts T helper (Th17) immune response, actin/myosin, cytoskeleton, and collagen systems. It that plays a role in autoimmunity and fibrotic states.  KD025 has a half life of approximately 5-6 hours with > 90% fecal excretion. It must be taken with meal or within 5 minutes of meal.   Key Inclusion Criteria:  IPF diagnosis within 5 years before study entry  PFT criteria Resting SpO2 >/= 88% with or without supplemental oxygen, Resting SpO2 >/= 88% with or without supplemental oxygen  FVC% >/= 50% FeV1/FVC ratio >/= 0.7 Residual Volume < 120% DLco >/= 30%   Effective Birth Control See I/E booklet for specifics   Have adequate  bone marrow function: ANC > 1500/mm3 Hemoglobin > 9.0g/L Platelets > 100,000/mm3  Patient SHOULD NOT be on anti-fibrotic (esbriet or ofev) therapy for IPF Has either received pirfenidone and/or nintedanib or has been offered both treatments (with last dose administered at least 1 month before the expected start of study drug dosing).  If either or both pirfenidone and nintedanib treatment has not been given, then documentation that the patient was offered both treatments must be documented.  Key Exclusion Criteria:   Severe concomitant illness limiting life expectancy (< 1 year).  Pulmonary or upper respiratory tract infection within 4 weeks before study entry.  Chronic heart failure with New York Heart Association class III/IV or known left ventricular ejection fraction < 25%  Moderate to severe hepatic impairment (ie. Child-Pugh Class B or C).  Estimated creatinine clearance < 342mmin  Aspartate aminotransferase (AST) and/or ALT > 2.0 x upper limit of normal (ULN)  Systolic blood pressure < 10045m  Men whose partner is pregnant or breastfeeding  Current drug or alcohol dependence.  Chronic treatment with the following drugs (within 4 weeks of study entry and during the study) Immunosuppressive or cytotoxic drugs including cyclophosphamide and azathioprine.  Antifibrotic drugs including pirfenidone, nintedanib, D penicillamine, colchicine, tumor necrosis factor alpha blockers, imatinib and interferon- gamma.  Chronic use of N-acetylcysteine prescribed for IPF ( > 600 mg/day).  Oral anticoagulants prescribed for IPF  Treatment with endothelin receptor antagonists within 4 weeks before study entry Systemic treatment with in 4 weeks before study entry with cyclosporine A or tacrolimus, everolimus, or sirolimus (calcineurin or mammalian target of rapamycin inhibitors)  Subject is taking a medication that has the potential for QTc prolongation (see Appendix E) Subject is taking a drug that is  sensitive substrate of CYP enzymes.   Key Medications Interactions (  Appendix E):   Use with Caution: alprazolam, diazepam, atorvastatin,simvastatin,chlor-pheniramine, astemizole, amlodipine, phenobarbital, phenytoin, hydrocortisone, St. John's Wort  Use with EXTREME caution: Fluconazole, ketoconazole, diltiazem, verapamil, erythromycin*, clarithromycin*, montelukast, grapefruit juice               SHOULD BE AVOIDED**: Quinidine, amiodarone *discuss with medical monitor first; **potential for prolonged qTcF  Safety Data: - IB 8.0 - July 2018 Pooled data Section 5.4 and Table 6-1: 269 subjects across various indications shows drug  to be generally well tolerated, mild-moderate AEs, and a  Low SAE rate.   >/=  5% side effects is Increased ALT - 10.8% (1.5% Gr3+), Increased AST 7.8% (1.1% Gr 3+), Increased GGT 7.8% (2.6% Gr 3+), Nausea 9.7% (0% Gr 3+), Diarrhea 8.9% (0.7% Gr3+), URTI 8.9%, Fatigue 7.8% (0% Gr 3+), Headache 6.3%, Cough 6.3%, Dyspnea 5%.   Majority are mild-moderate in severity.  To date all side effects attenuate within 3-4 of Rx start and rarely lead to discontinuation. They are also generally manageable with std prophylactic agents and dose modifications. Rx emergent increases in LFT have been reversible and NO subject has met Hy's law criteria (though 400 mg bid dose is no longer in use due to LFT risk).   Regarding SAEs - total 75 subjects experienced SAEs but SAEs experienced by >/= 3 subjects (1%)  was pneumonia 1.9%, dyspnea 1.5%, ILI 1.11% and chf 1.1%.  No death attributable to drug has been reported   Specifically with IPF population (current study so far)  Overall Safety Profile KD025(N=35) BSC (N=14)  Any severity AE  30 (86%) 11 (79%)   Grade 3+ AE 10 (29%) 2 (14%)  Grade 3+ AE due to drug 0 (0%) 0 (0%)  Discontinued due to AE 7 (20%) 1 (7%)  Discontinue due to AE                due to drug 1 (3%) - LFT  0 (0%)  SAE due to drug  0 (0%)   0 (0%)                     AEs>/= 10% - any severity    Increased Liver Enzymes 7 (20%) 2 (14%)   Cough 5 (15%) 4 (29%)  Fatigue 6 (17%) 1 (7%)  Dyspnea  4 (11%)  3 (21%)  Diarrhea 5 (15%)  0  Nausea 5 (14%) 2 (14%)  Dizziness 4 (11%) 1 (7%)  Upper Respiratory Infection 2 (6%) 2 (14%)  Muscle Spasms 4 (11%) 0                                              AEs in >/=2 subjects    Acute Respiratory Failure 2 (6%) 0 (0%)   Pneumonia 2 (6%) 0 (0%)  Dyspnea 2 (6%) 0 (0%)  CHF 2 (6%)  0 (0%)  CAD 2 (6%) 1 (7%)  Troponin increase  2 (6%)    0 (0%)      All patients treated with KD025 (including 9 crossovers)                                                        .Marland Kitchen..................Marland Kitchen Late  entry but patient see on 06/28/17  S: Screening visit. No new issues. 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 Brand Males, M.D., [facility].C.P Pulmonary and Critical Care Medicine Staff Physician [place] [facility] Pager: [phone], If no answer or between  15:00h - 7:00h: call [phone]  07/19/2017 11:24 AM

## 2017-07-10 ENCOUNTER — Telehealth: Payer: Self-pay | Admitting: Internal Medicine

## 2017-07-10 DIAGNOSIS — R269 Unspecified abnormalities of gait and mobility: Secondary | ICD-10-CM | POA: Diagnosis not present

## 2017-07-10 DIAGNOSIS — J438 Other emphysema: Secondary | ICD-10-CM | POA: Diagnosis not present

## 2017-07-10 DIAGNOSIS — J8410 Pulmonary fibrosis, unspecified: Secondary | ICD-10-CM | POA: Diagnosis not present

## 2017-07-10 DIAGNOSIS — J449 Chronic obstructive pulmonary disease, unspecified: Secondary | ICD-10-CM | POA: Diagnosis not present

## 2017-07-10 NOTE — Telephone Encounter (Signed)
Dear Jerry Montgomery and [name]  I saw [name] recently for IPF and for a IPF trial evauation. During this visit his sbp < 100 for first time atleast in my office visits. 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 I am not sure if he can come off or should come off 1 of his drugs controlling bp/hr. He was asymptomatic from bp standpoing when we saw him.  If so please let him know (side point: on this research protocol for inclusion their sbp needs to be > 100 but he need not come off bp drugs just for sake of research)  Thanks  M   Dr. Kalman Shan, M.D., [facility].C.P Pulmonary and Critical Care Medicine Staff Physician, [facility] Director - Interstitial Lung Disease  Pulmonary Fibrosis [facility], [place], [address]  Pager: [phone], If no answer or between  15:00h - 7:00h: call [phone] Telephone: [phone]

## 2017-07-12 ENCOUNTER — Other Ambulatory Visit: Payer: Self-pay | Admitting: Family Medicine

## 2017-07-12 DIAGNOSIS — E038 Other specified hypothyroidism: Secondary | ICD-10-CM

## 2017-07-12 NOTE — Telephone Encounter (Signed)
He can stop all blood pressure lowering drugs from my perspective. He has had an ablation and has no additional evidence of WPW. GT

## 2017-07-12 NOTE — Telephone Encounter (Signed)
Medication refill for one time only.  Patient needs to be seen.  Letter sent for patient to call and schedule 

## 2017-07-13 NOTE — Telephone Encounter (Signed)
Jerry Montgomery - Dr Jerry Montgomery CMA  (cc to Dr Jerry Montgomery and PCP Jerry Nones Priscille Heidelberg, MD and Jerry Montgomery - research coordinator - reply not required from cc folks unless you feel like)   Couyld you please call Jerry Montgomery ON hopefully 07/13/2017 if not 07/16/17 and let him know wthat I d/w Dr [name] his cardiology EP doc and that given his ablation few years ago and now lower BP (during recent research visit and is documented in research paper record) he does NOT need his bp drugs. So, my recommendation is  A) stop cardizem first - because is multiple times a day  B) continue lopressor for now   He will need a BP check few days after stopping the drugs and he can do it at home/[facility] and call us or he can come and see APP 07/18/17 -07/20/17    Dr. Kalman Shan, M.D., F.Montgomery.Montgomery.P Pulmonary and Critical Care Medicine Staff Physician [facility] Critical [facility] Pager: [phone], If no answer or between  15:00h - 7:00h: call [phone]  07/13/2017 9:29 AM

## 2017-07-16 ENCOUNTER — Other Ambulatory Visit: Payer: Self-pay | Admitting: *Deleted

## 2017-07-16 NOTE — Telephone Encounter (Signed)
Called pt letting him know conversation from MR with pt's cardiologist. Told pt to stop taking cardizem but continue taking lopressor. Pt also scheduled for an OV 07/19/17 with Kandice Robinsons, NP. Pt expressed understanding of this. Nothing further needed.

## 2017-07-18 NOTE — Progress Notes (Signed)
PFT completed.

## 2017-07-19 ENCOUNTER — Encounter: Payer: Self-pay | Admitting: Acute Care

## 2017-07-19 ENCOUNTER — Ambulatory Visit (INDEPENDENT_AMBULATORY_CARE_PROVIDER_SITE_OTHER): Payer: PPO | Admitting: Acute Care

## 2017-07-19 VITALS — BP 96/58 | HR 67 | Ht 74.0 in | Wt 214.2 lb

## 2017-07-19 DIAGNOSIS — I959 Hypotension, unspecified: Secondary | ICD-10-CM | POA: Insufficient documentation

## 2017-07-19 DIAGNOSIS — I456 Pre-excitation syndrome: Secondary | ICD-10-CM | POA: Diagnosis not present

## 2017-07-19 DIAGNOSIS — J9601 Acute respiratory failure with hypoxia: Secondary | ICD-10-CM | POA: Diagnosis not present

## 2017-07-19 DIAGNOSIS — I952 Hypotension due to drugs: Secondary | ICD-10-CM

## 2017-07-19 NOTE — Progress Notes (Addendum)
History of Present Illness Jerry Montgomery is a 61 y.o. male with IPF. He is a research patient and followed by Dr. Chase Caller.  HPI 61 year old male former smoker with hx of ILD  Hx of PTX s/p VATS pleurodesis in 2013.    9/27/2018Follow Up  Pt. Presents today for follow up of low blood pressure.He is being evaluated for enrollment  in an IPF  research study through [employer] 3 weeks ago.  He was noted to have low blood pressure at that appointment. Pt had been told on 07/16/17 to stop cardizem but continue lopressor. Dr. Chase Caller spoke with Dr. Lovena Le, the patients cardiologist and this decision was made at that time.. Patient was scheduled for follow up today to reevaluate low pressure. Goal is greater than 443 systolic.Jerry Montgomery Blood pressure remains low at today's visit. 96/58. Patient states he has had some dizziness at home.  He is able to stand without dizziness today in the office. He states he has had some weight loss.I spoke with Dr. Chase Caller by phone to discuss plan. Patient denies fever, chest pain, orthopnea, or hemoptysis. He is compliant with his albuterol, Advair, and Spiriva. He had discontinued his Cardizem as discussed earlier this week.  Test Results: 07/19/2017>>12 Lead EKG : SB rate of 59. 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 ANA autoimmune panel was negative, with trial of Esbriet but unable to tolerate.    02/2015  [employer] ab panel 03/09/15 and HP panel - all negative. C-anca trace positive but mpo/pr3 negative  PFT fvc 3.5L/64%, Ratio 72, TLC 6L/76%, DLCO 15/39% - moderate restriction. (Worse to stable  compared to 2013 when FVC was 4.1 L/78% and TLC was 6 L DLCO was 16/54%]  CT chest 03/18/2015  repirted 03/24/2015 i sAGAINST UIP and is worse compared to 2014. Other findings on CT - enlarged PA artery - echo 2014 showed PASP 40s and co art calcification -  stress lexiscan 2014 was normal.   Right heart catheterization done 05/18/2015  - Report reviewed personally cardiac output 5.84 L/m. Cardiac index 2.45 L/m per BSA, pulmonary arteries with mean pressure 18 mm and a wedge of 7. Reported as normal   Walk test 185 feet 3 laps on room air  - WE left him on room air for 20 minutes and then walked him 185 feet 3 laps. He desaturated only on the second lap to below 88%.   CBC Latest Ref Rng & Units 08/07/2016 05/24/2016 03/06/2016  WBC 3.8 - 10.8 K/uL 6.5 8.9 5.7  Hemoglobin 13.0 - 17.0 g/dL 15.3 13.8 13.9  Hematocrit 38.5 - 50.0 % 45.8 41.4 42.9  Platelets 140 - 400 K/uL 178 160 181    BMP Latest Ref Rng & Units 08/07/2016 05/24/2016 03/06/2016  Glucose 70 - 99 mg/dL 88 41(LL) 105(H)  BUN 7 - 25 mg/dL _0 Creatinine 0.70 - 1.25 mg/dL 1.28(H) 2.47(H) 1.29(H)  Sodium 135 - 146 mmol/L 143 139 140  Potassium 3.5 - 5.3 mmol/L 4.7 3.6 4.4  Chloride 98 - 110 mmol/L 102 104 103  CO2 20 - 31 mmol/L _1 Calcium 8.6 - 10.3 mg/dL 9.9 9.4 9.3     ProBNP    Component Value Date/Time   PROBNP 286.0 (H) 03/21/2010 0350    PFT    Component Value Date/Time   FEV1PRE 2.80 06/28/2017 1006   FEV1POST 2.62 04/19/2016 1643  FVCPRE 3.65 06/28/2017 1006   FVCPOST 3.37 04/19/2016 1643   TLC 5.13 06/28/2017 1006   DLCOUNC 15.53 06/28/2017 1006   PREFEV1FVCRT 77 06/28/2017 1006   PSTFEV1FVCRT 78 04/19/2016 1643    No results found.   Past medical hx Past Medical History:  Diagnosis Date  . Acute diastolic heart failure ([place])   . Atrial fibrillation ([place])   . Atrial tachycardia ([place])   . CAD (coronary artery disease)   . COPD (chronic obstructive pulmonary disease) ([place])   . Hyperlipidemia   . Hypertension   . Hypothyroidism   . On home oxygen therapy    "3L; 24/7" (04/15/2015)  . Pneumothorax on right 4/11  . Postinflammatory pulmonary fibrosis ([place])   . Sleep apnea    "won't use CPAP" (04/14/2105)  . SVT  (supraventricular tachycardia) ([place])    Per Dr. Tanna Furry note  . Type II diabetes mellitus ([place])   . WPW (Wolff-Parkinson-White syndrome)    Per Dr. Tanna Furry Note     Social History  Substance Use Topics  . Smoking status: Former Smoker    Packs/day: 1.50    Years: 35.00    Types: Cigarettes    Quit date: 10/24/2007  . Smokeless tobacco: Never Used  . Alcohol use 0.0 oz/week     Comment: 04/15/2015 "I've had a couple drinks in the last 8 months"    [name] reports that he quit smoking about 9 years ago. His smoking use included Cigarettes. He has a 52.50 pack-year smoking history. He has never used smokeless tobacco. He reports that he drinks alcohol. He reports that he does not use drugs.  Tobacco Cessation: Former smoker with a 52.5 pack year smoking history quit generally first 2009  Past surgical hx, Family hx, Social hx all reviewed.  Current Outpatient Prescriptions on File Prior to Visit  Medication Sig  . ADVAIR HFA 115-21 MCG/ACT inhaler Inhale 2 puffs into the lungs 2 (two) times daily. Reported on 02/25/2016  . albuterol (PROVENTIL HFA;VENTOLIN HFA) 108 (90 Base) MCG/ACT inhaler Inhale 2 puffs into the lungs every 6 (six) hours as needed for wheezing.  Jerry Montgomery ALPRAZolam (XANAX) 0.5 MG tablet Take 1 tablet (0.5 mg total) by mouth 3 (three) times daily as needed for anxiety.  Jerry Montgomery aspirin 81 MG tablet Take 81 mg by mouth every morning.   Jerry Montgomery atorvastatin (LIPITOR) 40 MG tablet Take 1 tablet by mouth every night at bedtime  . blood glucose meter kit and supplies KIT Dispense based on patient and insurance preference. Use up to four times daily as directed. (FOR ICD E11.65)  . Blood Glucose Monitoring Suppl (ONE TOUCH ULTRA 2) w/Device KIT Use to monitor FSBS 4x daily for fluctuating blood sugars. Dx: E11.65.  Jerry Montgomery Blood Glucose Monitoring Suppl (ONE TOUCH ULTRA SYSTEM KIT) w/Device KIT Use to monitor FSBS 4x daily for fluctuating blood sugars. Dx: E11.65.  . clopidogrel (PLAVIX) 75 MG  tablet Take 1 tablet by mouth every day  . dapagliflozin propanediol (FARXIGA) 10 MG TABS tablet Take 10 mg by mouth daily.  . furosemide (LASIX) 20 MG tablet Take 1 tablet (20 mg total) by mouth daily as needed for fluid (fluid).  . insulin aspart (NOVOLOG) 100 UNIT/ML injection Inject 10-30 Units into the skin See admin instructions. 10 UNITS WITH BREAKFAST AND 10 UNITS WITH LUNCH, 30 UNITS WITH SUPPER  . insulin glargine (LANTUS) 100 UNIT/ML injection Inject 0.6 mLs (60 Units total) into the skin at bedtime. (Patient taking differently: Inject 50 Units into the skin  at bedtime. )  . isosorbide mononitrate (IMDUR) 60 MG 24 hr tablet Take 60 mg by mouth every day  . levothyroxine (SYNTHROID, LEVOTHROID) 125 MCG tablet Take 1 tablet (125 mcg total) by mouth daily.  . Menthol, Topical Analgesic, (BIOFREEZE EX) Apply 1 application topically as needed (for pain).  . metFORMIN (GLUCOPHAGE) 850 MG tablet Take 1 tablet by mouth twice a day  . metoprolol succinate (TOPROL-XL) 100 MG 24 hr tablet Take 1 tablet (100 mg total) by mouth daily. Take with or immediately following a meal.  . nitroGLYCERIN (NITROSTAT) 0.4 MG SL tablet Place 1 tablet (0.4 mg total) under the tongue every 5 (five) minutes as needed for chest pain.  . ONE TOUCH ULTRA TEST test strip Use to monitor FSBS 4x daily for fluctuating blood sugars. Dx: E11.65.  Jerry Montgomery ONETOUCH DELICA LANCETS FINE MISC Test up to 4 times a day as directed.  Jerry Montgomery SYRINGE/NEEDLE, DISP, 1 ML (B-D SYRINGE/NEEDLE 1CC/25GX5/8) 25G X 5/8" 1 ML MISC As directed  . tiotropium (SPIRIVA) 18 MCG inhalation capsule Place 18 mcg into inhaler and inhale daily. Reported on 02/25/2016  . zolpidem (AMBIEN) 10 MG tablet Take 1 tablet (10 mg total) by mouth at bedtime.   No current facility-administered medications on file prior to visit.      Allergies  Allergen Reactions  . Niaspan [Niacin] Itching  . Ofev [Nintedanib] Other (See Comments)    Lethargic, wiped out  . Pirfenidone    . Altace [Ramipril] Other (See Comments)    Cough     Review Of Systems:  Constitutional:   No  weight loss, night sweats,  Fevers, chills, fatigue, or  lassitude.  HEENT:   No headaches,  Difficulty swallowing,  Tooth/dental problems, or  Sore throat,                No sneezing, itching, ear ache, nasal congestion, post nasal drip,   CV:  No chest pain,  Orthopnea, PND, swelling in lower extremities, anasarca, Occasional dizziness, no palpitations, syncope.   GI  No heartburn, indigestion, abdominal pain, nausea, vomiting, diarrhea, change in bowel habits, loss of appetite, bloody stools.   Resp: + shortness of breath with exertion or at rest.  No excess mucus, no productive cough,  No non-productive cough,  No coughing up of blood.  No change in color of mucus.  No wheezing.  No chest wall deformity  Skin: no rash or lesions.  GU: no dysuria, change in color of urine, no urgency or frequency.  No flank pain, no hematuria   MS:  No joint pain or swelling.  No decreased range of motion.  No back pain.  Psych:  No change in mood or affect. No depression or anxiety.  No memory loss.   Vital Signs BP (!) 96/58 (BP Location: Left Arm, Cuff Size: Normal)   Pulse 67   Ht _0  (1.88 m)   Wt 214 lb 3.2 oz (97.2 kg)   SpO2 97%   BMI 27.50 kg/m    Physical Exam:  General- No distress,  A&Ox3, thin male wearing nasal cannula oxygen, very pleasant ENT: No sinus tenderness, TM clear, pale nasal mucosa, no oral exudate,no post nasal drip, no LAN Cardiac: S1, S2, regular rate and rhythm, no murmur Chest: No wheeze/ + crackles/ dullness; no accessory muscle use, no nasal flaring, no sternal retractions Abd.: Soft Non-tender, nondistended, bowel sounds positive Ext: No clubbing cyanosis, edema Neuro:  normal strength, somewhat deconditioned by chronic illness, cranial nerves  intact, program. Skin: No rashes, warm and dry Psych: normal mood and  behavior   Assessment/Plan  Hypotension Continued hypotension despite discontinuation of Cardizem 07/16/2017 Plan EKG today. We will decrease your metoprolol to 50 mg once daily. Check your blood pressure after you have made the change. Goal is for systolic BP to be greater than 100, and less than 140. Please stand slowly and hold onto a counter. Please use fall precautions whenever you are dizzy. Research will call you regarding your next visit. Please notify cardiology and your PCP of this change Reason for change was hypotension , SBP<100 Follow-up with PCP cardiology as needed Follow up with the office if you have any problems. Please contact office for sooner follow up if symptoms do not improve or worsen or seek emergency care    Acute respiratory failure with hypoxia Continue wearing oxygen 4 L nasal cannula with exertion Oxygen goal saturations are greater than 88%    Magdalen Spatz, NP 07/19/2017  9:35 PM

## 2017-07-19 NOTE — Patient Instructions (Signed)
Per protocol 

## 2017-07-19 NOTE — Assessment & Plan Note (Signed)
Continue wearing oxygen 4 L nasal cannula with exertion Oxygen goal saturations are greater than 88%

## 2017-07-19 NOTE — Addendum Note (Signed)
Addended by: Kalman Shan on: 07/19/2017 11:28 AM   Modules accepted: Level of Service

## 2017-07-19 NOTE — Assessment & Plan Note (Addendum)
Continued hypotension despite discontinuation of Cardizem 07/16/2017 Plan EKG today. We will decrease your metoprolol to 50 mg once daily. Check your blood pressure after you have made the change. Goal is for systolic BP to be greater than 100, and less than 140. Please stand slowly and hold onto a counter. Please use fall precautions whenever you are dizzy. Research will call you regarding your next visit. Please notify cardiology and your PCP of this change Reason for change was hypotension , SBP<100 Follow-up with PCP cardiology as needed Follow up with the office if you have any problems. Please contact office for sooner follow up if symptoms do not improve or worsen or seek emergency care

## 2017-07-19 NOTE — Patient Instructions (Addendum)
It is nice to meet you today. EKG today. We will decrease your metoprolol to 50 mg once daily. Check your blood pressure after you have made the change. Goal is for systolic BP to be greater than 100, and less than 140. Research will call you regarding your next visit. Please notify cardiology and your PCP of this change, and the reason for change was hypotension  Follow up with the office if you have any problems. Please contact office for sooner follow up if symptoms do not improve or worsen or seek emergency care

## 2017-07-25 ENCOUNTER — Other Ambulatory Visit: Payer: Self-pay | Admitting: Family Medicine

## 2017-07-25 ENCOUNTER — Other Ambulatory Visit: Payer: PPO

## 2017-07-25 DIAGNOSIS — I10 Essential (primary) hypertension: Secondary | ICD-10-CM

## 2017-07-25 DIAGNOSIS — N179 Acute kidney failure, unspecified: Secondary | ICD-10-CM

## 2017-07-25 DIAGNOSIS — J9601 Acute respiratory failure with hypoxia: Secondary | ICD-10-CM

## 2017-07-25 DIAGNOSIS — E785 Hyperlipidemia, unspecified: Secondary | ICD-10-CM

## 2017-07-25 DIAGNOSIS — E039 Hypothyroidism, unspecified: Secondary | ICD-10-CM

## 2017-07-25 DIAGNOSIS — E1165 Type 2 diabetes mellitus with hyperglycemia: Secondary | ICD-10-CM | POA: Diagnosis not present

## 2017-07-25 DIAGNOSIS — I5031 Acute diastolic (congestive) heart failure: Secondary | ICD-10-CM

## 2017-07-25 DIAGNOSIS — Z79899 Other long term (current) drug therapy: Secondary | ICD-10-CM

## 2017-07-26 ENCOUNTER — Other Ambulatory Visit: Payer: PPO

## 2017-07-27 ENCOUNTER — Encounter: Payer: Self-pay | Admitting: Family Medicine

## 2017-07-27 ENCOUNTER — Ambulatory Visit (INDEPENDENT_AMBULATORY_CARE_PROVIDER_SITE_OTHER): Payer: PPO | Admitting: Family Medicine

## 2017-07-27 VITALS — BP 108/68 | HR 73 | Temp 98.1°F | Resp 16 | Ht 74.0 in | Wt 217.0 lb

## 2017-07-27 DIAGNOSIS — Z91119 Patient's noncompliance with dietary regimen due to unspecified reason: Secondary | ICD-10-CM

## 2017-07-27 DIAGNOSIS — Z9111 Patient's noncompliance with dietary regimen: Secondary | ICD-10-CM

## 2017-07-27 DIAGNOSIS — E1100 Type 2 diabetes mellitus with hyperosmolarity without nonketotic hyperglycemic-hyperosmolar coma (NKHHC): Secondary | ICD-10-CM | POA: Diagnosis not present

## 2017-07-27 DIAGNOSIS — E782 Mixed hyperlipidemia: Secondary | ICD-10-CM

## 2017-07-27 DIAGNOSIS — Z23 Encounter for immunization: Secondary | ICD-10-CM | POA: Diagnosis not present

## 2017-07-27 DIAGNOSIS — J8410 Pulmonary fibrosis, unspecified: Secondary | ICD-10-CM

## 2017-07-27 DIAGNOSIS — E039 Hypothyroidism, unspecified: Secondary | ICD-10-CM | POA: Diagnosis not present

## 2017-07-27 DIAGNOSIS — Z794 Long term (current) use of insulin: Secondary | ICD-10-CM

## 2017-07-27 LAB — COMPLETE METABOLIC PANEL WITH GFR
AG RATIO: 1.6 (calc) (ref 1.0–2.5)
ALBUMIN MSPROF: 4.1 g/dL (ref 3.6–5.1)
ALT: 22 U/L (ref 9–46)
AST: 17 U/L (ref 10–35)
Alkaline phosphatase (APISO): 63 U/L (ref 40–115)
BILIRUBIN TOTAL: 0.7 mg/dL (ref 0.2–1.2)
BUN / CREAT RATIO: 20 (calc) (ref 6–22)
BUN: 25 mg/dL (ref 7–25)
CHLORIDE: 101 mmol/L (ref 98–110)
CO2: 27 mmol/L (ref 20–32)
Calcium: 9.9 mg/dL (ref 8.6–10.3)
Creat: 1.27 mg/dL — ABNORMAL HIGH (ref 0.70–1.25)
GFR, EST AFRICAN AMERICAN: 69 mL/min/{1.73_m2} (ref >=60)
GFR, Est Non African American: 60 mL/min/{1.73_m2} (ref >=60)
Globulin: 2.6 g/dL (calc) (ref 1.9–3.7)
Glucose, Bld: 163 mg/dL — ABNORMAL HIGH (ref 65–99)
POTASSIUM: 4 mmol/L (ref 3.5–5.3)
Sodium: 140 mmol/L (ref 135–146)
TOTAL PROTEIN: 6.7 g/dL (ref 6.1–8.1)

## 2017-07-27 LAB — CBC WITH DIFFERENTIAL/PLATELET
Basophils Absolute: 38 cells/uL (ref 0–200)
Basophils Relative: 0.6 %
EOS PCT: 4.8 %
Eosinophils Absolute: 302 cells/uL (ref 15–500)
HCT: 46.2 % (ref 38.5–50.0)
Hemoglobin: 15.7 g/dL (ref 13.2–17.1)
Lymphs Abs: 1247 cells/uL (ref 850–3900)
MCH: 29.8 pg (ref 27.0–33.0)
MCHC: 34 g/dL (ref 32.0–36.0)
MCV: 87.7 fL (ref 80.0–100.0)
MONOS PCT: 12.3 %
MPV: 10.2 fL (ref 7.5–12.5)
NEUTROS PCT: 62.5 %
Neutro Abs: 3938 cells/uL (ref 1500–7800)
PLATELETS: 168 10*3/uL (ref 140–400)
RBC: 5.27 10*6/uL (ref 4.20–5.80)
RDW: 13.1 % (ref 11.0–15.0)
TOTAL LYMPHOCYTE: 19.8 %
WBC mixed population: 775 cells/uL (ref 200–950)
WBC: 6.3 10*3/uL (ref 3.8–10.8)

## 2017-07-27 LAB — HEMOGLOBIN A1C
EAG (MMOL/L): 18.2 (calc)
HEMOGLOBIN A1C: 13.1 %{Hb} — AB (ref <5.7)
MEAN PLASMA GLUCOSE: 329 (calc)

## 2017-07-27 LAB — LIPID PANEL
CHOLESTEROL: 150 mg/dL (ref <200)
HDL: 34 mg/dL — AB (ref >40)
LDL Cholesterol (Calc): 80 mg/dL (calc)
Non-HDL Cholesterol (Calc): 116 mg/dL (calc) (ref <130)
TRIGLYCERIDES: 274 mg/dL — AB (ref <150)
Total CHOL/HDL Ratio: 4.4 (calc) (ref <5.0)

## 2017-07-27 LAB — MICROALBUMIN / CREATININE URINE RATIO
CREATININE, URINE: 82 mg/dL (ref 20–320)
MICROALB/CREAT RATIO: 9 ug/mg{creat} (ref <30)
Microalb, Ur: 0.7 mg/dL

## 2017-07-27 LAB — TSH: TSH: 1.28 mIU/L (ref 0.40–4.50)

## 2017-07-27 MED ORDER — "SYRINGE/NEEDLE (DISP) 25G X 5/8"" 1 ML MISC": 5 refills | Status: DC

## 2017-07-27 MED ORDER — ALPRAZOLAM 0.5 MG PO TABS: 0.5000 mg | ORAL_TABLET | Freq: Every evening | ORAL | 0 refills | Status: DC | PRN

## 2017-07-27 NOTE — Progress Notes (Signed)
Subjective:    Patient ID: Jerry Montgomery, male    DOB: [birth year], 62 y.o.   MRN: [id number]  HPI 03/07/16 Recently saw his pulmonologist who recommended a low carbohydrate diet in order to achieve weight loss in order to help manage his pulmonary fibrosis. The patient states that he has successfully lost 9 pounds since starting this diet. However both he and the pulmonologists are concerned about hypoglycemia given the fact the patient is drastically reducing his carbohydrate consumption. Recently, his Hemoglobin A1c was significantly elevated 8.9. This suggests an average blood sugar greater than 200. Since starting the new diet, the patient states that his fasting blood sugars between 130 and 160. He states that his two-hour postprandial sugars are less than 200. However he also told me that prior to starting the new diet.  He is currently on Lantus 60 units once daily and rapid acting insulin/NovoLog 10 units with breakfast, 10 units with lunch, 30 units with supper. He is also on metformin.  At that time, my plan was: I doubt that the patient will experience hypoglycemia based on his A1c even with dramatic dietary changes. However I am concerned that his blood sugars are not well controlled. I would like to continue metformin at its current dose and continue Lantus at 60 units once daily since his fasting blood sugar is approximately 130. I will cut back on his mealtime insulin giving his drastic reduction in carbon consumption of meals. He will discontinue the 10 units of NovoLog with breakfast and lunch. I will continue 30 units with dinner at the present time as his evening blood sugars still approaching 200. If his two-hour postprandial sugars begin to drop, we will need to scale back on the 30 units with dinner. Given his history of ASCVD, I will add farxiga to assist in managing his blood sugar. He will take 10 mg a day. I believe that the mortality reduction seen with Vania Rea is likely a class  effect and I have samples of farixga to help offset cost. I believe this will also help with his weight loss attempts. Furthermore this allows [place] to scale back on his insulin which may also help with weight loss. Recheck fasting blood sugar and two-hour postprandial sugars in 2 weeks.  03/24/16 Wt Readings from Last 3 Encounters:  07/19/17 214 lb 3.2 oz (97.2 kg)  06/28/17 217 lb 12.8 oz (98.8 kg)  04/30/17 227 lb (103 kg)  Fast and blood sugars have dropped precipitously. His fasting blood sugars range between 64 and 90 in the morning. The vast majority are in the 49s. His evening blood sugars range between 101 and 228 but the vast majority are in the 170s.  At that time, my plan was: Because of the hypoglycemia he is seen in the morning, I will back down on his basal Lantus dose from 60-50 units. With his diet and exercise I anticipate that the postprandial sugars should start to fall below 160. Overall and very impressed with the drop in his blood sugars. Continue Farxiga 10 mg poqday.  07/27/17 Is here today for follow up of his diabetes. Most recent lab work shows a precipitous rise in his HgA1c. Appointment on 07/25/2017  Component Date Value Ref Range Status  . TSH 07/25/2017 1.28  0.40 - 4.50 mIU/L Final  . Creatinine, Urine 07/25/2017 82  20 - 320 mg/dL Final  . Microalb, Ur 07/25/2017 0.7  mg/dL Final   Comment: Reference Range Not established   . Microalb  Creat Ratio 07/25/2017 9  <30 mcg/mg creat Final   Comment: . The ADA defines abnormalities in albumin excretion as follows: Marland Kitchen Category         Result (mcg/mg creatinine) . Normal                    <30 Microalbuminuria         30-299  Clinical albuminuria   > OR = 300 . The ADA recommends that at least two of three specimens collected within a 3-6 month period be abnormal before considering a patient to be within a diagnostic category.   . Hgb A1c MFr Bld 07/25/2017 13.1* <5.7 % of total Hgb Final   Comment: For someone  without known diabetes, a hemoglobin A1c value of 6.5% or greater indicates that they may have  diabetes and this should be confirmed with a follow-up  test. . For someone with known diabetes, a value <7% indicates  that their diabetes is well controlled and a value  greater than or equal to 7% indicates suboptimal  control. A1c targets should be individualized based on  duration of diabetes, age, comorbid conditions, and  other considerations. . Currently, no consensus exists regarding use of hemoglobin A1c for diagnosis of diabetes for children. .   . Mean Plasma Glucose 07/25/2017 329  (calc) Final  . eAG (mmol/L) 07/25/2017 18.2  (calc) Final  . WBC 07/25/2017 6.3  3.8 - 10.8 Thousand/uL Final  . RBC 07/25/2017 5.27  4.20 - 5.80 Million/uL Final  . Hemoglobin 07/25/2017 15.7  13.2 - 17.1 g/dL Final  . HCT 07/25/2017 46.2  38.5 - 50.0 % Final  . MCV 07/25/2017 87.7  80.0 - 100.0 fL Final  . MCH 07/25/2017 29.8  27.0 - 33.0 pg Final  . MCHC 07/25/2017 34.0  32.0 - 36.0 g/dL Final  . RDW 07/25/2017 13.1  11.0 - 15.0 % Final  . Platelets 07/25/2017 168  140 - 400 Thousand/uL Final  . MPV 07/25/2017 10.2  7.5 - 12.5 fL Final  . Neutro Abs 07/25/2017 3938  1,500 - 7,800 cells/uL Final  . Lymphs Abs 07/25/2017 1247  850 - 3,900 cells/uL Final  . WBC mixed population 07/25/2017 775  200 - 950 cells/uL Final  . Eosinophils Absolute 07/25/2017 302  15 - 500 cells/uL Final  . Basophils Absolute 07/25/2017 38  0 - 200 cells/uL Final  . Neutrophils Relative % 07/25/2017 62.5  % Final  . Total Lymphocyte 07/25/2017 19.8  % Final  . Monocytes Relative 07/25/2017 12.3  % Final  . Eosinophils Relative 07/25/2017 4.8  % Final  . Basophils Relative 07/25/2017 0.6  % Final  . Cholesterol 07/25/2017 150  <200 mg/dL Final  . HDL 07/25/2017 34* >40 mg/dL Final  . Triglycerides 07/25/2017 274* <150 mg/dL Final  . LDL Cholesterol (Calc) 07/25/2017 80  mg/dL (calc) Final   Comment: Reference  range: <100 . Desirable range <100 mg/dL for primary prevention;   <70 mg/dL for patients with CHD or diabetic patients  with > or = 2 CHD risk factors. Marland Kitchen LDL-C is now calculated using the Martin-Hopkins  calculation, which is a validated novel method providing  better accuracy than the Friedewald equation in the  estimation of LDL-C.  Cresenciano Genre et al. Annamaria Helling. 0350;093(81): 2061-2068  (http://education.[facility].com/faq/FAQ164)   . Total CHOL/HDL Ratio 07/25/2017 4.4  <5.0 (calc) Final  . Non-HDL Cholesterol (Calc) 07/25/2017 116  <130 mg/dL (calc) Final   Comment: For patients with diabetes plus  1 major ASCVD risk  factor, treating to a non-HDL-C goal of <100 mg/dL  (LDL-C of <70 mg/dL) is considered a therapeutic  option.   . Glucose, Bld 07/25/2017 163* 65 - 99 mg/dL Final   Comment: .            Fasting reference interval . For someone without known diabetes, a glucose value >125 mg/dL indicates that they may have diabetes and this should be confirmed with a follow-up test. .   . BUN 07/25/2017 25  7 - 25 mg/dL Final  . Creat 07/25/2017 1.27* 0.70 - 1.25 mg/dL Final   Comment: For patients >30 years of age, the reference limit for Creatinine is approximately 13% higher for people identified as African-American. .   . GFR, Est Non African American 07/25/2017 60  > OR = 60 mL/min/1.76m Final  . GFR, Est African American 07/25/2017 69  > OR = 60 mL/min/1.753mFinal  . BUN/Creatinine Ratio 07/25/2017 20  6 - 22 (calc) Final  . Sodium 07/25/2017 140  135 - 146 mmol/L Final  . Potassium 07/25/2017 4.0  3.5 - 5.3 mmol/L Final  . Chloride 07/25/2017 101  98 - 110 mmol/L Final  . CO2 07/25/2017 27  20 - 32 mmol/L Final  . Calcium 07/25/2017 9.9  8.6 - 10.3 mg/dL Final  . Total Protein 07/25/2017 6.7  6.1 - 8.1 g/dL Final  . Albumin 07/25/2017 4.1  3.6 - 5.1 g/dL Final  . Globulin 07/25/2017 2.6  1.9 - 3.7 g/dL (calc) Final  . AG Ratio 07/25/2017 1.6  1.0 - 2.5 (calc)  Final  . Total Bilirubin 07/25/2017 0.7  0.2 - 1.2 mg/dL Final  . Alkaline phosphatase (APISO) 07/25/2017 63  40 - 115 U/L Final  . AST 07/25/2017 17  10 - 35 U/L Final  . ALT 07/25/2017 22  9 - 46 U/L Final     Patient admits that approximately 2 months ago he completely stopped his insulin. He was previously taking 60 units of Lantus and 30 units of NovoLog per day. However he discontinued both forms of insulin. He also reports frequent dietary indiscretion. He has been eating candy, drinking grape juice every day, and eating high carbohydrate diet. He is completely stopped checking his blood sugar. He reports polyuria, polydipsia, and blurry vision. He also reports significant weight loss since his last visit. He denies any chest pain. 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 Recently had metoprolol reduced in dosage so that he could participate N/A clinical trial regarding pulmonary fibrosis. His heart rate today is well controlled as is his blood pressure. In fact the majority of his blood pressures are borderline hypotensive. He denies any syncope or near syncope. He denies any myalgias or right upper quadrant pain.  He has been taking all of his other pill medication including his Lipitor. His most recent cholesterol is excellent with an LDL cholesterol well below 100. His HDL cholesterol remains low at 34 however the patient is unable to engage in aerobic exercise due to his respiratory failure. His TSH is within therapeutic limits.  He is compliant with levothyroxine 125 g daily. He admits that he basically became lazy with taking his insulin. He would like to resume the medication at this time  Past Medical History:  Diagnosis Date  . Acute diastolic heart failure ([facility]  . Atrial fibrillation ([facility]  . Atrial tachycardia ([place]  . CAD (coronary artery  disease)   . COPD (chronic obstructive pulmonary disease) ([place])   .  Hyperlipidemia   . Hypertension   . Hypothyroidism   . On home oxygen therapy    "3L; 24/7" (04/15/2015)  . Pneumothorax on right 4/11  . Postinflammatory pulmonary fibrosis ([place])   . Sleep apnea    "won't use CPAP" (04/14/2105)  . SVT (supraventricular tachycardia) ([place])    Per Dr. Tanna Furry note  . Type II diabetes mellitus ([place])   . WPW (Wolff-Parkinson-White syndrome)    Per Dr. Tanna Furry Note   Past Surgical History:  Procedure Laterality Date  . BILATERAL VATS ABLATION Right   . CARDIAC CATHETERIZATION  05/03/2007   patent stents  . CARDIAC CATHETERIZATION N/A 05/18/2015   Procedure: Right Heart Cath;  Surgeon: Belva Crome, MD;  Location: [facility];  Service: Cardiovascular;  Laterality: N/A;  . CORONARY ANGIOPLASTY WITH STENT PLACEMENT  03/05/2003; 2008; 01/13/2010   PCI & Stent  LAD & mid CX; stent to CX  (I've got 4 stents total" (04/15/2015)  . ELECTROPHYSIOLOGIC STUDY N/A 04/14/2015   Procedure: SVT Ablation;  Surgeon: Evans Lance, MD;  Location: [facility];  Service: Cardiovascular;  Laterality: N/A;  . KNEE ARTHROSCOPY Right 01/22/2013   Procedure: RIGHT ARTHROSCOPY KNEE WITH DEBRIDEMENT, abrasion chondroplasty of lateral tibial plateau, debridement of partial tear of ACL, menisectomy;  Surgeon: Tobi Bastos, MD;  Location: [facility];  Service: Orthopedics;  Laterality: Right;  . KNEE SURGERY  2012   "reattached quad"  . LUNG LOBECTOMY Right   . SHOULDER ARTHROSCOPY W/ ROTATOR CUFF REPAIR Bilateral   . VIDEO ASSISTED THORACOSCOPY Left 07/19/2015   Procedure: LEFT VIDEO ASSISTED THORACOSCOPY WITH LEFT UPPER AND LOWER LUNG BIOPSY;  Surgeon: Melrose Nakayama, MD;  Location: [facility];  Service: Thoracic;  Laterality: Left;  Marland Kitchen VIDEO BRONCHOSCOPY N/A 07/19/2015   Procedure: VIDEO BRONCHOSCOPY WITH FLUORO;  Surgeon: Melrose Nakayama, MD;  Location: [facility];  Service: Thoracic;  Laterality: N/A;   Current Outpatient Prescriptions on File Prior to Visit    Medication Sig Dispense Refill  . ADVAIR HFA 115-21 MCG/ACT inhaler Inhale 2 puffs into the lungs 2 (two) times daily. Reported on 02/25/2016    . albuterol (PROVENTIL HFA;VENTOLIN HFA) 108 (90 Base) MCG/ACT inhaler Inhale 2 puffs into the lungs every 6 (six) hours as needed for wheezing. 3 Inhaler 3  . ALPRAZolam (XANAX) 0.5 MG tablet Take 1 tablet (0.5 mg total) by mouth 3 (three) times daily as needed for anxiety. 90 tablet 0  . aspirin 81 MG tablet Take 81 mg by mouth every morning.     Marland Kitchen atorvastatin (LIPITOR) 40 MG tablet Take 1 tablet by mouth every night at bedtime 30 tablet 2  . blood glucose meter kit and supplies KIT Dispense based on patient and insurance preference. Use up to four times daily as directed. (FOR ICD E11.65) 1 each 0  . Blood Glucose Monitoring Suppl (ONE TOUCH ULTRA 2) w/Device KIT Use to monitor FSBS 4x daily for fluctuating blood sugars. Dx: E11.65. 1 each 0  . Blood Glucose Monitoring Suppl (ONE TOUCH ULTRA SYSTEM KIT) w/Device KIT Use to monitor FSBS 4x daily for fluctuating blood sugars. Dx: E11.65. 1 each 1  . clopidogrel (PLAVIX) 75 MG tablet Take 1 tablet by mouth every day 90 tablet 3  . dapagliflozin propanediol (FARXIGA) 10 MG TABS tablet Take 10 mg by mouth daily.    . furosemide (LASIX) 20 MG tablet Take 1  tablet (20 mg total) by mouth daily as needed for fluid (fluid). 30 tablet 0  . insulin aspart (NOVOLOG) 100 UNIT/ML injection Inject 10-30 Units into the skin See admin instructions. 10 UNITS WITH BREAKFAST AND 10 UNITS WITH LUNCH, 30 UNITS WITH SUPPER 60 mL 5  . insulin glargine (LANTUS) 100 UNIT/ML injection Inject 0.6 mLs (60 Units total) into the skin at bedtime. (Patient taking differently: Inject 50 Units into the skin at bedtime. ) 60 mL 3  . isosorbide mononitrate (IMDUR) 60 MG 24 hr tablet Take 60 mg by mouth every day 90 tablet 2  . levothyroxine (SYNTHROID, LEVOTHROID) 125 MCG tablet Take 1 tablet (125 mcg total) by mouth daily. 30 tablet 0  .  Menthol, Topical Analgesic, (BIOFREEZE EX) Apply 1 application topically as needed (for pain).    . metFORMIN (GLUCOPHAGE) 850 MG tablet Take 1 tablet by mouth twice a day 180 tablet 2  . metoprolol succinate (TOPROL-XL) 100 MG 24 hr tablet Take 1 tablet (100 mg total) by mouth daily. Take with or immediately following a meal. 90 tablet 3  . nitroGLYCERIN (NITROSTAT) 0.4 MG SL tablet Place 1 tablet (0.4 mg total) under the tongue every 5 (five) minutes as needed for chest pain. 25 tablet 1  . ONE TOUCH ULTRA TEST test strip Use to monitor FSBS 4x daily for fluctuating blood sugars. Dx: E11.65. 400 each 3  . ONETOUCH DELICA LANCETS FINE MISC Test up to 4 times a day as directed. 100 each 5  . SYRINGE/NEEDLE, DISP, 1 ML (B-D SYRINGE/NEEDLE 1CC/25GX5/8) 25G X 5/8" 1 ML MISC As directed 100 each 5  . tiotropium (SPIRIVA) 18 MCG inhalation capsule Place 18 mcg into inhaler and inhale daily. Reported on 02/25/2016    . zolpidem (AMBIEN) 10 MG tablet Take 1 tablet (10 mg total) by mouth at bedtime. 90 tablet 1   No current facility-administered medications on file prior to visit.    Allergies  Allergen Reactions  . Niaspan [Niacin] Itching  . Ofev [Nintedanib] Other (See Comments)    Lethargic, wiped out  . Pirfenidone   . Altace [Ramipril] Other (See Comments)    Cough    Social History   Social History  . Marital status: Single    Spouse name: N/A  . Number of children: 1  . Years of education: N/A   Occupational History  .  [employer]   Social History Main Topics  . Smoking status: Former Smoker    Packs/day: 1.50    Years: 35.00    Types: Cigarettes    Quit date: 10/24/2007  . Smokeless tobacco: Never Used  . Alcohol use 0.0 oz/week     Comment: 04/15/2015 "I've had a couple drinks in the last 8 months"  . Drug use: No  . Sexual activity: Not Currently   Other Topics Concern  . Not on file   Social History Narrative  . No narrative on file     Review of Systems    All other systems reviewed and are negative.      Objective:   Physical Exam  Constitutional: He appears well-developed and well-nourished.  Neck: No JVD present.  Cardiovascular: Normal rate, regular rhythm and normal heart sounds.   Pulmonary/Chest: He has wheezes.  Abdominal: Soft. Bowel sounds are normal. He exhibits no distension. There is no tenderness. There is no rebound.  Musculoskeletal: He exhibits edema.  Vitals reviewed.         Assessment & Plan:  Flu vaccine  need - Plan: Flu Vaccine QUAD 36+ mos IM  Uncontrolled type 2 diabetes mellitus with hyperosmolarity without coma, with long-term current use of insulin (HCC)  Pulmonary fibrosis (HCC)  Dietary noncompliance  Hypothyroidism, unspecified type  Mixed hyperlipidemia   Spent more than 25 minutes today with the patient in discussion. I would like him to resume Lantus 60 units a day. I would like him to resume NovoLog 10 units with each meal for a total of 30 units a day. I want him to contact me next week with fasting blood sugars and two-hour postprandial blood sugars so that we can titrate his insulin further. He received his flu shot today. Diabetic foot exam is normal. Blood pressure today is controlled. Fasting lipid panel shows excellent control with his hyperlipidemia. TSH shows a therapeutic dosage of levothyroxine.

## 2017-08-09 ENCOUNTER — Encounter: Payer: Self-pay | Admitting: Internal Medicine

## 2017-08-09 ENCOUNTER — Ambulatory Visit (INDEPENDENT_AMBULATORY_CARE_PROVIDER_SITE_OTHER): Payer: PPO | Admitting: Internal Medicine

## 2017-08-09 VITALS — BP 108/79 | HR 55 | Temp 98.1°F | Resp 18 | Ht 72.0 in | Wt 225.0 lb

## 2017-08-09 DIAGNOSIS — J8410 Pulmonary fibrosis, unspecified: Secondary | ICD-10-CM | POA: Diagnosis not present

## 2017-08-09 DIAGNOSIS — J438 Other emphysema: Secondary | ICD-10-CM | POA: Diagnosis not present

## 2017-08-09 DIAGNOSIS — J84112 Idiopathic pulmonary fibrosis: Secondary | ICD-10-CM

## 2017-08-09 DIAGNOSIS — R269 Unspecified abnormalities of gait and mobility: Secondary | ICD-10-CM | POA: Diagnosis not present

## 2017-08-09 DIAGNOSIS — J449 Chronic obstructive pulmonary disease, unspecified: Secondary | ICD-10-CM | POA: Diagnosis not present

## 2017-08-09 DIAGNOSIS — Z006 Encounter for examination for normal comparison and control in clinical research program: Secondary | ICD-10-CM

## 2017-08-09 LAB — PULMONARY FUNCTION TEST
DL/VA % pred: 53 %
DL/VA: 2.51 ml/min/mmHg/L
DLCO UNC % PRED: 45 %
DLCO UNC: 15.77 ml/min/mmHg
FEF 25-75 Pre: 1.87 L/sec
FEF2575-%Pred-Pre: 62 %
FEV1-%Pred-Pre: 68 %
FEV1-PRE: 2.57 L
FEV1FVC-%Pred-Pre: 99 %
FEV6-%PRED-PRE: 72 %
FEV6-PRE: 3.43 L
FEV6FVC-%Pred-Pre: 105 %
FVC-%Pred-Pre: 68 %
FVC-Pre: 3.43 L
PRE FEV1/FVC RATIO: 75 %
PRE FEV6/FVC RATIO: 100 %
RV % PRED: 71 %
RV: 1.73 L
TLC % pred: 71 %
TLC: 5.3 L

## 2017-08-09 NOTE — Progress Notes (Signed)
Title: A randomized, Phase 2, Open-Label, Multicenter Study to Evaluate the Safety, Tolerability and Activity of KD025 in Subjects with Idiopathic Pulmonary  Fibrosis (IPF). Protocol: [id number]. Clinical Trials #: [id number]. Sponsor: [employer] [address] [place] [id number]  Synopsis: Eligible IPF subjects will take 400mg  of KD025 qday compared to Basic Supportive Care [facility](BSC). The latter is essentially subjects not on currently approved approved anti-fibrotic therapy.  Subjects will be randomized either into group one and will receive KD025 400 mg daily x 24 weeks or Group two and will receive BSC but undergo same procedures and assessments as subjects receiving KD025. Group one subjects may continue to receive therapy with KD025 as long as no safety signal/clinical progress continues. Subjects receiving BSC have the option of switching to therapy with KD025 after week 24.  A decline in FVC of 5-10% indicates prognostic relevant progression. No subject will receive more than 96 weeks of treatment with KD025 in this study.    .......................... S: screening visit for abovve study. He expressed voluntary visiti  O: in source doc  A Research subject for above study IPF  P Per protocol  Dr. Kalman ShanMurali Ramaswamy, M.D., [facility].P Pulmonary and Critical Care Medicine Staff Physician [facility] Pager: [phone], If no answer or between  15:00h - 7:00h: call [phone]  08/23/2017 8:56 AM

## 2017-08-09 NOTE — Progress Notes (Signed)
Title: A randomized, Phase 2, Open-Label, Multicenter Study to Evaluate the Safety, Tolerability and Activity of KD025 in Subjects with Idiopathic Pulmonary  Fibrosis (IPF). Protocol: IR518-841. Clinical Trials #: [id number]. Sponsor: [facility] [address]  Synopsis: Eligible IPF subjects will take 484m of KD025 qday compared to [facility]. The latter is essentially subjects not on currently approved approved anti-fibrotic therapy.  Subjects will be randomized either into group one and will receive KD025 400 mg daily x 24 weeks or Group two and will receive BSC but undergo same procedures and assessments as subjects receiving KD025. Group one subjects may continue to receive therapy with KD025 as long as no safety signal/clinical progress continues. Subjects receiving BSC have the option of switching to therapy with KD025 after week 24.  A decline in FVC of 5-10% indicates prognostic relevant progression. No subject will receive more than 96 weeks of treatment with KD025 in this study.   Key Features of KD025: KD025 is a selective ROCK2 inhibitor that impacts T helper (Th17) immune response, actin/myosin, cytoskeleton, and collagen systems. It that plays a role in autoimmunity and fibrotic states.  KD025 has a half life of approximately 5-6 hours with > 90% fecal excretion. It must be taken with meal or within 5 minutes of meal.   Key Inclusion Criteria:  IPF diagnosis within 5 years before study entry  PFT criteria Resting SpO2 >/= 88% with or without supplemental oxygen, Resting SpO2 >/= 88% with or without supplemental oxygen  FVC% >/= 50% FeV1/FVC ratio >/= 0.7 Residual Volume < 120% DLco >/= 30%   Effective Birth Control See I/E booklet for specifics   Have adequate bone  marrow function: ANC > 1500/mm3 Hemoglobin > 9.0g/L Platelets > 100,000/mm3  Patient SHOULD NOT be on anti-fibrotic (esbriet or ofev) therapy for IPF Has either received pirfenidone and/or nintedanib or has been offered both treatments (with last dose administered at least 1 month before the expected start of study drug dosing).  If either or both pirfenidone and nintedanib treatment has not been given, then documentation that the patient was offered both treatments must be documented.  Key Exclusion Criteria:   Severe concomitant illness limiting life expectancy (< 1 year).  Pulmonary or upper respiratory tract infection within 4 weeks before study entry.  Chronic heart failure with New York Heart Association class III/IV or known left ventricular ejection fraction < 25%  Moderate to severe hepatic impairment (ie. Child-Pugh Class B or C).  Estimated creatinine clearance < 366mmin  Aspartate aminotransferase (AST) and/or ALT > 2.0 x upper limit of normal (ULN)  Systolic blood pressure < 10033m  Men whose partner is pregnant or breastfeeding  Current drug or alcohol dependence.  Chronic treatment with the following drugs (within 4 weeks of study entry and during the study) Immunosuppressive or cytotoxic drugs including cyclophosphamide and azathioprine.  Antifibrotic drugs including pirfenidone, nintedanib, D penicillamine, colchicine, tumor necrosis factor alpha blockers, imatinib and interferon- gamma.  Chronic use of N-acetylcysteine prescribed for IPF ( > 600 mg/day).  Oral anticoagulants prescribed for IPF  Treatment with endothelin receptor antagonists within 4 weeks before study entry Systemic treatment with in 4 weeks before study entry with cyclosporine A or tacrolimus, everolimus, or sirolimus (calcineurin or mammalian target of rapamycin inhibitors)  Subject is taking a medication that has the potential for QTc prolongation (see Appendix E) Subject is taking a drug that is  sensitive substrate of CYP enzymes.   Key Medications Interactions (  Appendix E):   Use with Caution: alprazolam, diazepam, atorvastatin,simvastatin,chlor-pheniramine, astemizole, amlodipine, phenobarbital, phenytoin, hydrocortisone, St. John's Wort  Use with EXTREME caution: Fluconazole, ketoconazole, diltiazem, verapamil, erythromycin*, clarithromycin*, montelukast, grapefruit juice               SHOULD BE AVOIDED**: Quinidine, amiodarone *discuss with medical monitor first; **potential for prolonged qTcF  Safety Data: - IB 8.0 - July 2018 Pooled data Section 5.4 and Table 6-1: 269 subjects across various indications shows drug  to be generally well tolerated, mild-moderate AEs, and a  Low SAE rate.   >/=  5% side effects is Increased ALT - 10.8% (1.5% Gr3+), Increased AST 7.8% (1.1% Gr 3+), Increased GGT 7.8% (2.6% Gr 3+), Nausea 9.7% (0% Gr 3+), Diarrhea 8.9% (0.7% Gr3+), URTI 8.9%, Fatigue 7.8% (0% Gr 3+), Headache 6.3%, Cough 6.3%, Dyspnea 5%.   Majority are mild-moderate in severity.  To date all side effects attenuate within 3-4 of Rx start and rarely lead to discontinuation. They are also generally manageable with std prophylactic agents and dose modifications. Rx emergent increases in LFT have been reversible and NO subject has met Hy's law criteria (though 400 mg bid dose is no longer in use due to LFT risk).   Regarding SAEs - total 75 subjects experienced SAEs but SAEs experienced by >/= 3 subjects (1%)  was pneumonia 1.9%, dyspnea 1.5%, ILI 1.11% and chf 1.1%.  No death attributable to drug has been reported   Specifically with IPF population (current study so far)  Overall Safety Profile KD025(N=35) BSC (N=14)  Any severity AE  30 (86%) 11 (79%)   Grade 3+ AE 10 (29%) 2 (14%)  Grade 3+ AE due to drug 0 (0%) 0 (0%)  Discontinued due to AE 7 (20%) 1 (7%)  Discontinue due to AE                due to drug 1 (3%) - LFT  0 (0%)  SAE due to drug  0 (0%)   0 (0%)                     AEs>/= 10% - any severity    Increased Liver Enzymes 7 (20%) 2 (14%)   Cough 5 (15%) 4 (29%)  Fatigue 6 (17%) 1 (7%)  Dyspnea  4 (11%)  3 (21%)  Diarrhea 5 (15%)  0  Nausea 5 (14%) 2 (14%)  Dizziness 4 (11%) 1 (7%)  Upper Respiratory Infection 2 (6%) 2 (14%)  Muscle Spasms 4 (11%) 0                                              AEs in >/=2 subjects    Acute Respiratory Failure 2 (6%) 0 (0%)   Pneumonia 2 (6%) 0 (0%)  Dyspnea 2 (6%) 0 (0%)  CHF 2 (6%)  0 (0%)  CAD 2 (6%) 1 (7%)  Troponin increase  2 (6%)    0 (0%)      All patients treated with KD025 (including 9 crossovers)   Clinical Research officer, political party / Research RN note : This visit for [name] with DOB: 09/29/1956 on 08/09/2017 for the above protocol is Visit/Encounter # Re-Screening and is for purpose of research . The consent for this encounter is under Protocol Version 03/Jul/2018 and  is currently IRB approved. Subject expressed continued interest  and consent in continuing as a study subject. Subject confirmed that there was  no change in contact information (e.g. address, telephone, email). Subject thanked for participation in research and contribution to science.   In this visit 08/09/2017 the subject will be evaluated by investigator named Dr. Chase Caller . This research coordinator has verified that the investigator is uptodate with his/her training logs   Because this visit is a key visit ofscreening this visit is under direct supervision of the [place]. This PI is available for this visit All procedures completed per the above mentioned protocol. Subject denied any questions or concerns. This coordinator advised the subject we will contact him once we have approval to proceed to the baseline visit.   Signed by Catano Bing, CMA, [place] Coordinator [facility]  [place], [place] 1:16 PM 08/09/2017

## 2017-08-11 ENCOUNTER — Other Ambulatory Visit: Payer: Self-pay | Admitting: Family Medicine

## 2017-08-14 ENCOUNTER — Other Ambulatory Visit: Payer: Self-pay | Admitting: Family Medicine

## 2017-08-14 DIAGNOSIS — E038 Other specified hypothyroidism: Secondary | ICD-10-CM

## 2017-08-15 ENCOUNTER — Telehealth: Payer: Self-pay | Admitting: Family Medicine

## 2017-08-15 NOTE — Telephone Encounter (Signed)
Requesting a refill on Ambien - Ok to refill??        [facility] Fax - [phone])

## 2017-08-16 ENCOUNTER — Encounter: Payer: Self-pay | Admitting: Family Medicine

## 2017-08-16 ENCOUNTER — Ambulatory Visit (INDEPENDENT_AMBULATORY_CARE_PROVIDER_SITE_OTHER): Payer: PPO | Admitting: Family Medicine

## 2017-08-16 VITALS — BP 98/58 | HR 56 | Temp 97.6°F | Resp 20 | Ht 73.0 in | Wt 220.0 lb

## 2017-08-16 DIAGNOSIS — R091 Pleurisy: Secondary | ICD-10-CM | POA: Diagnosis not present

## 2017-08-16 MED ORDER — ZOLPIDEM TARTRATE 10 MG PO TABS: 10.0000 mg | ORAL_TABLET | Freq: Every day | ORAL | 1 refills | Status: DC

## 2017-08-16 MED ORDER — LEVOFLOXACIN 500 MG PO TABS: 500.0000 mg | ORAL_TABLET | Freq: Every day | ORAL | 0 refills | Status: DC

## 2017-08-16 NOTE — Telephone Encounter (Signed)
Med faxed to [facility]

## 2017-08-16 NOTE — Progress Notes (Signed)
                                                                                          .  ATTEST

## 2017-08-16 NOTE — Telephone Encounter (Signed)
ok 

## 2017-08-16 NOTE — Progress Notes (Signed)
Subjective:    Patient ID: Jerry Montgomery, male    DOB: 1956/04/12, 61 y.o.   MRN: [id number]  HPI Patient developed symptoms 48 hours ago.  Symptoms include cough and chest congestion.  He also has right basilar pleurisy.  Has a history of severe underlying interstitial lung disease as well as COPD on chronic oxygen.  On examination today, he has fine right basilar crackles.  There are no expiratory wheezes.  He does endorse pleurisy and subjective fevers.  He also has rhinorrhea and head congestion raising the suspicion of an upper respiratory infection such as a cold. Past Medical History:  Diagnosis Date  . Acute diastolic heart failure ([place])   . Atrial fibrillation ([place])   . Atrial tachycardia ([place])   . CAD (coronary artery disease)   . COPD (chronic obstructive pulmonary disease) ([place])   . Hyperlipidemia   . Hypertension   . Hypothyroidism   . On home oxygen therapy    "3L; 24/7" (04/15/2015)  . Pneumothorax on right 4/11  . Postinflammatory pulmonary fibrosis ([place])   . Sleep apnea    "won't use CPAP" (04/14/2105)  . SVT (supraventricular tachycardia) ([place])    Per Dr. Tanna Furry note  . Type II diabetes mellitus ([place])   . WPW (Wolff-Parkinson-White syndrome)    Per Dr. Tanna Furry Note   Past Surgical History:  Procedure Laterality Date  . BILATERAL VATS ABLATION Right   . CARDIAC CATHETERIZATION  05/03/2007   patent stents  . CARDIAC CATHETERIZATION N/A 05/18/2015   Procedure: Right Heart Cath;  Surgeon: Belva Crome, MD;  Location: [place] [facility];  Service: Cardiovascular;  Laterality: N/A;  . CORONARY ANGIOPLASTY WITH STENT PLACEMENT  03/05/2003; 2008; 01/13/2010   PCI & Stent  LAD & mid CX; stent to CX  (I've got 4 stents total" (04/15/2015)  . ELECTROPHYSIOLOGIC STUDY N/A 04/14/2015   Procedure: SVT Ablation;  Surgeon: Evans Lance, MD;  Location: [place] [facility];  Service: Cardiovascular;  Laterality: N/A;  . KNEE ARTHROSCOPY Right 01/22/2013   Procedure: RIGHT  ARTHROSCOPY KNEE WITH DEBRIDEMENT, abrasion chondroplasty of lateral tibial plateau, debridement of partial tear of ACL, menisectomy;  Surgeon: Tobi Bastos, MD;  Location: [facility];  Service: Orthopedics;  Laterality: Right;  . KNEE SURGERY  2012   "reattached quad"  . LUNG LOBECTOMY Right   . SHOULDER ARTHROSCOPY W/ ROTATOR CUFF REPAIR Bilateral   . VIDEO ASSISTED THORACOSCOPY Left 07/19/2015   Procedure: LEFT VIDEO ASSISTED THORACOSCOPY WITH LEFT UPPER AND LOWER LUNG BIOPSY;  Surgeon: Melrose Nakayama, MD;  Location: [facility];  Service: Thoracic;  Laterality: Left;  Marland Kitchen VIDEO BRONCHOSCOPY N/A 07/19/2015   Procedure: VIDEO BRONCHOSCOPY WITH FLUORO;  Surgeon: Melrose Nakayama, MD;  Location: [facility];  Service: Thoracic;  Laterality: N/A;   Current Outpatient Prescriptions on File Prior to Visit  Medication Sig Dispense Refill  . albuterol (PROVENTIL HFA;VENTOLIN HFA) 108 (90 Base) MCG/ACT inhaler Inhale 2 puffs into the lungs every 6 (six) hours as needed for wheezing. 3 Inhaler 3  . ALPRAZolam (XANAX) 0.5 MG tablet Take 1 tablet (0.5 mg total) by mouth at bedtime as needed for anxiety. 30 tablet 0  . aspirin 81 MG tablet Take 81 mg by mouth every morning.     Marland Kitchen atorvastatin (LIPITOR) 40 MG tablet Take 1 tablet by mouth every night at bedtime 90 tablet 1  . blood glucose meter kit and supplies KIT Dispense based on patient and insurance preference. Use up  to four times daily as directed. (FOR ICD E11.65) 1 each 0  . Blood Glucose Monitoring Suppl (ONE TOUCH ULTRA 2) w/Device KIT Use to monitor FSBS 4x daily for fluctuating blood sugars. Dx: E11.65. 1 each 0  . Blood Glucose Monitoring Suppl (ONE TOUCH ULTRA SYSTEM KIT) w/Device KIT Use to monitor FSBS 4x daily for fluctuating blood sugars. Dx: E11.65. 1 each 1  . clopidogrel (PLAVIX) 75 MG tablet Take 1 tablet by mouth every day 90 tablet 3  . furosemide (LASIX) 20 MG tablet Take 1 tablet (20 mg total) by mouth daily as needed for fluid  (fluid). 90 tablet 3  . insulin aspart (NOVOLOG) 100 UNIT/ML injection Inject 10-30 Units into the skin See admin instructions. 10 UNITS WITH BREAKFAST AND 10 UNITS WITH LUNCH, 30 UNITS WITH SUPPER (Patient taking differently: Inject 10-30 Units into the skin See admin instructions. 10 UNITS WITH BREAKFAST AND 10 UNITS WITH LUNCH, 30 UNITS WITH SUPPER) 60 mL 5  . insulin glargine (LANTUS) 100 UNIT/ML injection Inject 0.6 mLs (60 Units total) into the skin at bedtime. (Patient taking differently: Inject 50 Units into the skin at bedtime. ) 60 mL 3  . isosorbide mononitrate (IMDUR) 60 MG 24 hr tablet Take 60 mg by mouth every day 90 tablet 2  . levothyroxine (SYNTHROID, LEVOTHROID) 125 MCG tablet Take 1 tablet (125 mcg total) by mouth daily. 90 tablet 3  . Menthol, Topical Analgesic, (BIOFREEZE EX) Apply 1 application topically as needed (for pain).    . metFORMIN (GLUCOPHAGE) 850 MG tablet Take 1 tablet by mouth twice a day 180 tablet 2  . metoprolol succinate (TOPROL-XL) 100 MG 24 hr tablet Take 1 tablet (100 mg total) by mouth daily. Take with or immediately following a meal. (Patient taking differently: Take 100 mg by mouth daily. Take with or immediately following a meal.) 90 tablet 3  . nitroGLYCERIN (NITROSTAT) 0.4 MG SL tablet Place 1 tablet (0.4 mg total) under the tongue every 5 (five) minutes as needed for chest pain. 25 tablet 1  . ONE TOUCH ULTRA TEST test strip Use to monitor FSBS 4x daily for fluctuating blood sugars. Dx: E11.65. 400 each 3  . ONETOUCH DELICA LANCETS FINE MISC Test up to 4 times a day as directed. 100 each 5  . SYRINGE/NEEDLE, DISP, 1 ML (B-D SYRINGE/NEEDLE 1CC/25GX5/8) 25G X 5/8" 1 ML MISC As directed 100 each 5  . tiotropium (SPIRIVA) 18 MCG inhalation capsule Place 18 mcg into inhaler and inhale daily. Reported on 02/25/2016    . zolpidem (AMBIEN) 10 MG tablet Take 1 tablet (10 mg total) by mouth at bedtime. 90 tablet 1   No current facility-administered medications on  file prior to visit.    Allergies  Allergen Reactions  . Niaspan [Niacin] Itching  . Ofev [Nintedanib] Other (See Comments)    Lethargic, wiped out  . Pirfenidone   . Altace [Ramipril] Other (See Comments)    Cough    Social History   Social History  . Marital status: Single    Spouse name: N/A  . Number of children: 1  . Years of education: N/A   Occupational History  .  [employer]   Social History Main Topics  . Smoking status: Former Smoker    Packs/day: 1.50    Years: 35.00    Types: Cigarettes    Quit date: 10/24/2007  . Smokeless tobacco: Never Used  . Alcohol use 0.0 oz/week     Comment: 04/15/2015 "I've had a  couple drinks in the last 8 months"  . Drug use: No  . Sexual activity: Not Currently   Other Topics Concern  . Not on file   Social History Narrative  . No narrative on file      Review of Systems  All other systems reviewed and are negative.      Objective:   Physical Exam  Constitutional: He appears well-developed and well-nourished. No distress.  HENT:  Nose: Mucosal edema and rhinorrhea present.  Neck: Neck supple.  Cardiovascular: Normal rate, regular rhythm and normal heart sounds.   Pulmonary/Chest: Effort normal. He has no decreased breath sounds. He has rhonchi.      Abdominal: Soft. Bowel sounds are normal.  Musculoskeletal: He exhibits no edema.  Lymphadenopathy:    He has no cervical adenopathy.  Skin: He is not diaphoretic.  Vitals reviewed.         Assessment & Plan:  Pleurisy - Plan: levofloxacin (LEVAQUIN) 500 MG tablet, DG Chest 2 View  I will treat the patient for possible community-acquired pneumonia even though the majority of his symptoms sound viral with Levaquin 500 mg p.o. daily for 7 days.  I encouraged the patient to push fluids.  I want him to get a chest x-ray immediately.  Reassess in 48 hours or sooner if worse

## 2017-08-23 ENCOUNTER — Telehealth: Payer: Self-pay | Admitting: *Deleted

## 2017-08-23 NOTE — Telephone Encounter (Signed)
This patient was consented and screened for a research study [id number] Barbaraann Boys([place]) on 18/Oct/2018. Refer to previous notes for further documentation of the consent and screening visit.  Today, this coordinator was reviewing the subjects EMR and discovered that the subject saw his PCP on 25/Oct/2018 and was diagnosed with Pleurisy and prescribed Levaquin. Per the above mentioned protocol, this would be considered an AE and the subject would need to be screen failed. This coordinator spoke with PI, Dr. [name], and PI agrees the subject should not be re-screened at this time. Refer to the subjects paper source binder for further documentation.   AE: Pleurisy, start date 23/Oct/2018, prescribed Levaquin 500mg  daily x 7 days. Subject is not on study medication at this time, so this is not related. PI will determine severity.   Carron CurieJennifer Castillo, CMA, [facility] Certified Clinical Research Coordinator [facility], [place] [phone]

## 2017-08-24 ENCOUNTER — Telehealth: Payer: Self-pay | Admitting: Family Medicine

## 2017-08-24 NOTE — Telephone Encounter (Signed)
Pt's Blood sugars were elevated and pt was not taking his medication regularly as prescribed. He has since restarted his medication on regular basis - Lantus 60 and Novolog 10,10,10. Pt was informed to continue insulin and check bs prior to meals and call us back in 1 week. Pt verbalized understanding.

## 2017-09-09 DIAGNOSIS — J449 Chronic obstructive pulmonary disease, unspecified: Secondary | ICD-10-CM | POA: Diagnosis not present

## 2017-09-09 DIAGNOSIS — J438 Other emphysema: Secondary | ICD-10-CM | POA: Diagnosis not present

## 2017-09-09 DIAGNOSIS — J8410 Pulmonary fibrosis, unspecified: Secondary | ICD-10-CM | POA: Diagnosis not present

## 2017-09-09 DIAGNOSIS — R269 Unspecified abnormalities of gait and mobility: Secondary | ICD-10-CM | POA: Diagnosis not present

## 2017-09-11 ENCOUNTER — Encounter: Payer: Self-pay | Admitting: Family Medicine

## 2017-09-11 ENCOUNTER — Other Ambulatory Visit: Payer: Self-pay | Admitting: Family Medicine

## 2017-09-11 MED ORDER — INSULIN GLARGINE 100 UNIT/ML ~~LOC~~ SOLN: 60.0000 [IU] | Freq: Every day | SUBCUTANEOUS | 3 refills | Status: DC

## 2017-09-11 NOTE — Telephone Encounter (Signed)
PI OVERSIGHT ATTESTATION  I the Principal Investigator (PI) for the above mentioned study attest that I reviewed the above mentioned clinical research coordinator notes on research subject  Jerry NewtonRobert S Labonte  born 05/20/1956 . I  agree with the findings mentioned above   Dr. Kalman ShanMurali Frantz Quattrone, M.D., F.C.C.P., ACRP-CPI Pulmonary and Critical Care Medicine Principal Investigator & Staff Physician [facility]  [place] [facility] Pager: [phone], If no answer or between  15:00h - 7:00h: call [phone]  09/11/2017 8:56 AM

## 2017-09-12 ENCOUNTER — Other Ambulatory Visit: Payer: Self-pay

## 2017-09-12 NOTE — Telephone Encounter (Signed)
error 

## 2017-10-05 ENCOUNTER — Encounter: Payer: Self-pay | Admitting: Family Medicine

## 2017-10-05 ENCOUNTER — Ambulatory Visit: Payer: PPO | Admitting: Family Medicine

## 2017-10-05 VITALS — BP 100/68 | HR 86 | Temp 98.3°F | Ht 73.0 in | Wt 233.0 lb

## 2017-10-05 DIAGNOSIS — J0100 Acute maxillary sinusitis, unspecified: Secondary | ICD-10-CM

## 2017-10-05 MED ORDER — PREDNISONE 20 MG PO TABS: ORAL_TABLET | ORAL | 0 refills | Status: DC

## 2017-10-05 MED ORDER — AMOXICILLIN-POT CLAVULANATE 875-125 MG PO TABS: 1.0000 | ORAL_TABLET | Freq: Two times a day (BID) | ORAL | 0 refills | Status: DC

## 2017-10-05 NOTE — Progress Notes (Signed)
Subjective:    Patient ID: Jerry Montgomery, male    DOB: 1956-02-18, 61 y.o.   MRN: [id number]  HPI Patient never got better from his office visit October 25.  He has been sick now for a month and a half.  He reports head congestion, pain in his maxillary and frontal sinuses, postnasal drip causing a tickle in his throat that keeps him constantly coughing, bloody purulent mucus every time he blows his nose or sneezes, constant sneezing, and a dull pressure-like headache.  Exam today is significant for tenderness to percussion over his frontal and maxillary sinuses Past Medical History:  Diagnosis Date  . Acute diastolic heart failure ([place])   . Atrial fibrillation ([place])   . Atrial tachycardia ([place])   . CAD (coronary artery disease)   . COPD (chronic obstructive pulmonary disease) ([place])   . Hyperlipidemia   . Hypertension   . Hypothyroidism   . On home oxygen therapy    "3L; 24/7" (04/15/2015)  . Pneumothorax on right 4/11  . Postinflammatory pulmonary fibrosis ([place])   . Sleep apnea    "won't use CPAP" (04/14/2105)  . SVT (supraventricular tachycardia) ([place])    Per Dr. Tanna Furry note  . Type II diabetes mellitus ([place])   . WPW (Wolff-Parkinson-White syndrome)    Per Dr. Tanna Furry Note   Past Surgical History:  Procedure Laterality Date  . BILATERAL VATS ABLATION Right   . CARDIAC CATHETERIZATION  05/03/2007   patent stents  . CARDIAC CATHETERIZATION N/A 05/18/2015   Procedure: Right Heart Cath;  Surgeon: Belva Crome, MD;  Location: [facility];  Service: Cardiovascular;  Laterality: N/A;  . CORONARY ANGIOPLASTY WITH STENT PLACEMENT  03/05/2003; 2008; 01/13/2010   PCI & Stent  LAD & mid CX; stent to CX  (I've got 4 stents total" (04/15/2015)  . ELECTROPHYSIOLOGIC STUDY N/A 04/14/2015   Procedure: SVT Ablation;  Surgeon: Evans Lance, MD;  Location: [facility];  Service: Cardiovascular;  Laterality: N/A;  . KNEE ARTHROSCOPY Right 01/22/2013   Procedure: RIGHT ARTHROSCOPY  KNEE WITH DEBRIDEMENT, abrasion chondroplasty of lateral tibial plateau, debridement of partial tear of ACL, menisectomy;  Surgeon: Tobi Bastos, MD;  Location: [facility];  Service: Orthopedics;  Laterality: Right;  . KNEE SURGERY  2012   "reattached quad"  . LUNG LOBECTOMY Right   . SHOULDER ARTHROSCOPY W/ ROTATOR CUFF REPAIR Bilateral   . VIDEO ASSISTED THORACOSCOPY Left 07/19/2015   Procedure: LEFT VIDEO ASSISTED THORACOSCOPY WITH LEFT UPPER AND LOWER LUNG BIOPSY;  Surgeon: Melrose Nakayama, MD;  Location: [facility];  Service: Thoracic;  Laterality: Left;  Marland Kitchen VIDEO BRONCHOSCOPY N/A 07/19/2015   Procedure: VIDEO BRONCHOSCOPY WITH FLUORO;  Surgeon: Melrose Nakayama, MD;  Location: [facility];  Service: Thoracic;  Laterality: N/A;   Current Outpatient Medications on File Prior to Visit  Medication Sig Dispense Refill  . albuterol (PROVENTIL HFA;VENTOLIN HFA) 108 (90 Base) MCG/ACT inhaler Inhale 2 puffs into the lungs every 6 (six) hours as needed for wheezing. 3 Inhaler 3  . ALPRAZolam (XANAX) 0.5 MG tablet Take 1 tablet (0.5 mg total) by mouth at bedtime as needed for anxiety. 30 tablet 0  . aspirin 81 MG tablet Take 81 mg by mouth every morning.     Marland Kitchen atorvastatin (LIPITOR) 40 MG tablet Take 1 tablet by mouth every night at bedtime 90 tablet 1  . blood glucose meter kit and supplies KIT Dispense based on patient and insurance preference. Use up to  four times daily as directed. (FOR ICD E11.65) 1 each 0  . Blood Glucose Monitoring Suppl (ONE TOUCH ULTRA 2) w/Device KIT Use to monitor FSBS 4x daily for fluctuating blood sugars. Dx: E11.65. 1 each 0  . Blood Glucose Monitoring Suppl (ONE TOUCH ULTRA SYSTEM KIT) w/Device KIT Use to monitor FSBS 4x daily for fluctuating blood sugars. Dx: E11.65. 1 each 1  . clopidogrel (PLAVIX) 75 MG tablet Take 1 tablet by mouth every day 90 tablet 3  . furosemide (LASIX) 20 MG tablet Take 1 tablet (20 mg total) by mouth daily as needed for fluid (fluid). 90 tablet 3    . insulin aspart (NOVOLOG) 100 UNIT/ML injection Inject 10-30 Units into the skin See admin instructions. 10 UNITS WITH BREAKFAST AND 10 UNITS WITH LUNCH, 30 UNITS WITH SUPPER (Patient taking differently: 15 UNITS WITH BREAKFAST AND 15 UNITS WITH LUNCH, 20 UNITS WITH SUPPER) 60 mL 5  . insulin glargine (LANTUS) 100 UNIT/ML injection Inject 0.6 mLs (60 Units total) into the skin at bedtime. 60 mL 3  . isosorbide mononitrate (IMDUR) 60 MG 24 hr tablet Take 60 mg by mouth every day 90 tablet 2  . levofloxacin (LEVAQUIN) 500 MG tablet Take 1 tablet (500 mg total) by mouth daily. 7 tablet 0  . levothyroxine (SYNTHROID, LEVOTHROID) 125 MCG tablet Take 1 tablet (125 mcg total) by mouth daily. 90 tablet 3  . Menthol, Topical Analgesic, (BIOFREEZE EX) Apply 1 application topically as needed (for pain).    . metFORMIN (GLUCOPHAGE) 850 MG tablet Take 1 tablet by mouth twice a day 180 tablet 2  . metoprolol succinate (TOPROL-XL) 100 MG 24 hr tablet Take 1 tablet (100 mg total) by mouth daily. Take with or immediately following a meal. (Patient taking differently: Take 100 mg by mouth daily. Take with or immediately following a meal.) 90 tablet 3  . nitroGLYCERIN (NITROSTAT) 0.4 MG SL tablet Place 1 tablet (0.4 mg total) under the tongue every 5 (five) minutes as needed for chest pain. 25 tablet 1  . ONE TOUCH ULTRA TEST test strip Use to monitor FSBS 4x daily for fluctuating blood sugars. Dx: E11.65. 400 each 3  . ONETOUCH DELICA LANCETS FINE MISC Test up to 4 times a day as directed. 100 each 5  . SYRINGE/NEEDLE, DISP, 1 ML (B-D SYRINGE/NEEDLE 1CC/25GX5/8) 25G X 5/8" 1 ML MISC As directed 100 each 5  . tiotropium (SPIRIVA) 18 MCG inhalation capsule Place 18 mcg into inhaler and inhale daily. Reported on 02/25/2016    . zolpidem (AMBIEN) 10 MG tablet Take 1 tablet (10 mg total) by mouth at bedtime. 90 tablet 1   No current facility-administered medications on file prior to visit.    Allergies  Allergen  Reactions  . Niaspan [Niacin] Itching  . Ofev [Nintedanib] Other (See Comments)    Lethargic, wiped out  . Pirfenidone   . Altace [Ramipril] Other (See Comments)    Cough    Social History   Socioeconomic History  . Marital status: Single    Spouse name: Not on file  . Number of children: 1  . Years of education: Not on file  . Highest education level: Not on file  Social Needs  . Financial resource strain: Not on file  . Food insecurity - worry: Not on file  . Food insecurity - inability: Not on file  . Transportation needs - medical: Not on file  . Transportation needs - non-medical: Not on file  Occupational History  Employer: [employer]  Tobacco Use  . Smoking status: Former Smoker    Packs/day: 1.50    Years: 35.00    Pack years: 52.50    Types: Cigarettes    Last attempt to quit: 10/24/2007    Years since quitting: 9.9  . Smokeless tobacco: Never Used  Substance and Sexual Activity  . Alcohol use: Yes    Alcohol/week: 0.0 oz    Comment: 04/15/2015 "I've had a couple drinks in the last 8 months"  . Drug use: No  . Sexual activity: Not Currently  Other Topics Concern  . Not on file  Social History Narrative  . Not on file      Review of Systems  All other systems reviewed and are negative.      Objective:   Physical Exam  Constitutional: He appears well-developed and well-nourished. No distress.  HENT:  Nose: Mucosal edema and rhinorrhea present. Right sinus exhibits maxillary sinus tenderness and frontal sinus tenderness. Left sinus exhibits maxillary sinus tenderness and frontal sinus tenderness.  Neck: Neck supple.  Cardiovascular: Normal rate, regular rhythm and normal heart sounds.  Pulmonary/Chest: Effort normal. He has no decreased breath sounds. He has rhonchi in the right lower field and the left lower field.  Abdominal: Soft. Bowel sounds are normal.  Musculoskeletal: He exhibits no edema.  Lymphadenopathy:    He has no cervical  adenopathy.  Skin: He is not diaphoretic.  Vitals reviewed.         Assessment & Plan:  Acute non-recurrent maxillary sinusitis  I believe the patient has developed chronic sinusitis after an upper respiratory infection in October.  I will treat the patient with Augmentin 875 mg p.o. twice daily for 10 days and a prednisone taper pack.  Reassess in 2 weeks if no better or sooner if worse

## 2017-10-09 DIAGNOSIS — J8410 Pulmonary fibrosis, unspecified: Secondary | ICD-10-CM | POA: Diagnosis not present

## 2017-10-09 DIAGNOSIS — R269 Unspecified abnormalities of gait and mobility: Secondary | ICD-10-CM | POA: Diagnosis not present

## 2017-10-09 DIAGNOSIS — J438 Other emphysema: Secondary | ICD-10-CM | POA: Diagnosis not present

## 2017-10-09 DIAGNOSIS — J449 Chronic obstructive pulmonary disease, unspecified: Secondary | ICD-10-CM | POA: Diagnosis not present

## 2017-10-10 ENCOUNTER — Other Ambulatory Visit: Payer: Self-pay | Admitting: Family Medicine

## 2017-11-05 ENCOUNTER — Encounter: Payer: Self-pay | Admitting: Cardiovascular Disease

## 2017-11-05 ENCOUNTER — Ambulatory Visit: Payer: PPO | Admitting: Cardiovascular Disease

## 2017-11-05 VITALS — BP 138/83 | HR 66 | Ht 73.0 in | Wt 237.8 lb

## 2017-11-05 DIAGNOSIS — E119 Type 2 diabetes mellitus without complications: Secondary | ICD-10-CM | POA: Diagnosis not present

## 2017-11-05 DIAGNOSIS — E782 Mixed hyperlipidemia: Secondary | ICD-10-CM

## 2017-11-05 DIAGNOSIS — I2510 Atherosclerotic heart disease of native coronary artery without angina pectoris: Secondary | ICD-10-CM | POA: Diagnosis not present

## 2017-11-05 DIAGNOSIS — Z79899 Other long term (current) drug therapy: Secondary | ICD-10-CM | POA: Diagnosis not present

## 2017-11-05 DIAGNOSIS — J8410 Pulmonary fibrosis, unspecified: Secondary | ICD-10-CM | POA: Diagnosis not present

## 2017-11-05 DIAGNOSIS — I10 Essential (primary) hypertension: Secondary | ICD-10-CM | POA: Diagnosis not present

## 2017-11-05 MED ORDER — OMEGA-3-ACID ETHYL ESTERS 1 G PO CAPS: 2.0000 g | ORAL_CAPSULE | Freq: Two times a day (BID) | ORAL | 3 refills | Status: DC

## 2017-11-05 MED ORDER — METOPROLOL SUCCINATE ER 50 MG PO TB24: 50.0000 mg | ORAL_TABLET | Freq: Every day | ORAL | 3 refills | Status: DC

## 2017-11-05 NOTE — Progress Notes (Signed)
Patient ID: Jerry Montgomery, male   DOB: July 07, 1956, 62 y.o.   MRN: [id number]     HPI: Jerry Montgomery is a 62 y.o. male who presents for over 3 year cardiology evaluation with me.  Jerry Montgomery has history of known coronary artery disease in May 2004 underwent stenting of his proximal LAD and proximal circumflex coronary arteries. In July 2008 he developed a new 85% stenosis of the mid circumflex vessel at which time a 3.5x13 mm Cypher stent was inserted. His 2 previously placed stents remain patent. At his last catheterization in March 2011 he was found to have 95% stenosis between the tube previously placed stents in the circumflex and a new 3.0x20 mm post and was inserted to cover the mid area and also cover the proximal region. His LAD stent was patent with 40% proximal and mid stenosis obtuse marginal 1 vessel at 60-70% narrowing the distal circumflex had 30% narrowing and a nondominant right coronary artery had 80% stenosis. In 2012, he ruptured his quadriceps tendon and underwent surgery by Dr. Gladstone Montgomery and tolerated this well from a cardiac standpoint. He does have a remote history of tobacco use. Has a history of hyperlipidemia, hypertension, type 2 diabetes mellitus, as well as emphysematous blebs. In May 2001 he underwent resection of apical bullae pleurectomy and pleurocentesis. He had been followed by Dr. Melvyn Montgomery for pulmonary care and now some care of Dr. Donzetta Montgomery at [facility]. Apparently he was started on oxygen. He denies any anginal symptoms. Is unaware of tachycardia dysrhythmias.  He has been diagnosed with pulmonary fibrosis.  He has been on home oxygen since December 2014.  He denies anginal symptoms.  He is unaware of wheezing.  He is unaware of palpitations.  He does note shortness of breath with activity.  He denies any claudication.  He denies any presyncope or syncope.  Laboratory from September 2015 showed a cholesterol of 113, triglycerides 152, HDL 28, LDL 55 on his  atorvastatin 40 mg.  Hemoglobin A1c was increased at 8.4.  His TSH was slightly increased at 5.5 and his Synthroid dose was just increased from 75 mcg to 100 mcg daily.  An echo Doppler study on 06/10/2014 showed an ejection fraction of 55-60%.  There was grade 2 diastolic dysfunction.  He had moderate left atrial dilatation.  There was moderate pulmonary hypertension with a PA pressure 48 mm.    Since I last saw him in September 2015.  He is now on supplemental oxygen for pulmonary fibrosis and is followed by Dr. Chase Montgomery.  He is also followed by Dr. Lovena Montgomery for Wolff-Parkinson-White syndrome and SVT.  He underwent an EP study and catheter ablation of the left posterior accessory pathway and since his ablation he denies any recurrent arrhythmia.  He denies any episodes of chest pain.  He is unaware of palpitations but has the [place] app on his phone to monitor for atrial fibrillation.  He presents for reevaluation.  Past Medical History:  Diagnosis Date  . Acute diastolic heart failure ([place])   . Atrial fibrillation ([place])   . Atrial tachycardia ([place])   . CAD (coronary artery disease)   . COPD (chronic obstructive pulmonary disease) ([place])   . Hyperlipidemia   . Hypertension   . Hypothyroidism   . On home oxygen therapy    "3L; 24/7" (04/15/2015)  . Pneumothorax on right 4/11  . Postinflammatory pulmonary fibrosis ([place])   . Sleep apnea    "won't use CPAP" (04/14/2105)  . SVT (  supraventricular tachycardia) ([place])    Per Jerry Montgomery note  . Type II diabetes mellitus ([place])   . WPW (Wolff-Parkinson-White syndrome)    Per Jerry Montgomery Note    Past Surgical History:  Procedure Laterality Date  . BILATERAL VATS ABLATION Right   . CARDIAC CATHETERIZATION  05/03/2007   patent stents  . CARDIAC CATHETERIZATION N/A 05/18/2015   Procedure: Right Heart Cath;  Surgeon: Jerry Crome, MD;  Location: [place] [facility];  Service: Cardiovascular;  Laterality: N/A;  . CORONARY ANGIOPLASTY WITH STENT  PLACEMENT  03/05/2003; 2008; 01/13/2010   PCI & Stent  LAD & mid CX; stent to CX  (I've got 4 stents total" (04/15/2015)  . ELECTROPHYSIOLOGIC STUDY N/A 04/14/2015   Procedure: SVT Ablation;  Surgeon: Jerry Lance, MD;  Location: [place] [facility];  Service: Cardiovascular;  Laterality: N/A;  . KNEE ARTHROSCOPY Right 01/22/2013   Procedure: RIGHT ARTHROSCOPY KNEE WITH DEBRIDEMENT, abrasion chondroplasty of lateral tibial plateau, debridement of partial tear of ACL, menisectomy;  Surgeon: Jerry Bastos, MD;  Location: [facility];  Service: Orthopedics;  Laterality: Right;  . KNEE SURGERY  2012   "reattached quad"  . LUNG LOBECTOMY Right   . SHOULDER ARTHROSCOPY W/ ROTATOR CUFF REPAIR Bilateral   . VIDEO ASSISTED THORACOSCOPY Left 07/19/2015   Procedure: LEFT VIDEO ASSISTED THORACOSCOPY WITH LEFT UPPER AND LOWER LUNG BIOPSY;  Surgeon: Melrose Nakayama, MD;  Location: [place];  Service: Thoracic;  Laterality: Left;  Marland Kitchen VIDEO BRONCHOSCOPY N/A 07/19/2015   Procedure: VIDEO BRONCHOSCOPY WITH FLUORO;  Surgeon: Melrose Nakayama, MD;  Location: [place];  Service: Thoracic;  Laterality: N/A;    Allergies  Allergen Reactions  . Niaspan [Niacin] Itching  . Ofev [Nintedanib] Other (See Comments)    Lethargic, wiped out  . Pirfenidone   . Altace [Ramipril] Other (See Comments)    Cough     Current Outpatient Medications  Medication Sig Dispense Refill  . albuterol (PROVENTIL HFA;VENTOLIN HFA) 108 (90 Base) MCG/ACT inhaler Inhale 2 puffs into the lungs every 6 (six) hours as needed for wheezing. 3 Inhaler 3  . ALPRAZolam (XANAX) 0.5 MG tablet Take 1 tablet (0.5 mg total) by mouth at bedtime as needed for anxiety. 30 tablet 0  . amoxicillin-clavulanate (AUGMENTIN) 875-125 MG tablet Take 1 tablet by mouth 2 (two) times daily. 20 tablet 0  . aspirin 81 MG tablet Take 81 mg by mouth every morning.     Marland Kitchen atorvastatin (LIPITOR) 40 MG tablet Take 1 tablet by mouth every night at bedtime 90 tablet 1  . blood  glucose meter kit and supplies KIT Dispense based on patient and insurance preference. Use up to four times daily as directed. (FOR ICD E11.65) 1 each 0  . Blood Glucose Monitoring Suppl (ONE TOUCH ULTRA 2) w/Device KIT Use to monitor FSBS 4x daily for fluctuating blood sugars. Dx: E11.65. 1 each 0  . Blood Glucose Monitoring Suppl (ONE TOUCH ULTRA SYSTEM KIT) w/Device KIT Use to monitor FSBS 4x daily for fluctuating blood sugars. Dx: E11.65. 1 each 1  . clopidogrel (PLAVIX) 75 MG tablet Take 1 tablet by mouth every day 90 tablet 3  . furosemide (LASIX) 20 MG tablet Take 1 tablet (20 mg total) by mouth daily as needed for fluid (fluid). 90 tablet 3  . insulin aspart (NOVOLOG) 100 UNIT/ML injection Inject 10-30 Units into the skin See admin instructions. 10 UNITS WITH BREAKFAST AND 10 UNITS WITH LUNCH, 30 UNITS WITH SUPPER (Patient taking  differently: 15 UNITS WITH BREAKFAST AND 15 UNITS WITH LUNCH, 20 UNITS WITH SUPPER) 60 mL 5  . insulin glargine (LANTUS) 100 UNIT/ML injection Inject 0.6 mLs (60 Units total) into the skin at bedtime. 60 mL 3  . levofloxacin (LEVAQUIN) 500 MG tablet Take 1 tablet (500 mg total) by mouth daily. 7 tablet 0  . levothyroxine (SYNTHROID, LEVOTHROID) 125 MCG tablet Take 1 tablet (125 mcg total) by mouth daily. 90 tablet 3  . Menthol, Topical Analgesic, (BIOFREEZE EX) Apply 1 application topically as needed (for pain).    . metFORMIN (GLUCOPHAGE) 850 MG tablet Take 1 tablet by mouth twice a day 180 tablet 1  . metoprolol succinate (TOPROL-XL) 50 MG 24 hr tablet Take 1 tablet (50 mg total) by mouth daily. Take with or immediately following a meal. 90 tablet 3  . nitroGLYCERIN (NITROSTAT) 0.4 MG SL tablet Place 1 tablet (0.4 mg total) under the tongue every 5 (five) minutes as needed for chest pain. 25 tablet 1  . ONETOUCH DELICA LANCETS FINE MISC Test up to 4 times a day as directed. 100 each 5  . predniSONE (DELTASONE) 20 MG tablet 3 tabs poqday 1-2, 2 tabs poqday 3-4, 1 tab  poqday 5-6 12 tablet 0  . SYRINGE/NEEDLE, DISP, 1 ML (B-D SYRINGE/NEEDLE 1CC/25GX5/8) 25G X 5/8" 1 ML MISC As directed 100 each 5  . tiotropium (SPIRIVA) 18 MCG inhalation capsule Place 18 mcg into inhaler and inhale daily. Reported on 02/25/2016    . zolpidem (AMBIEN) 10 MG tablet Take 1 tablet (10 mg total) by mouth at bedtime. 90 tablet 1  . isosorbide mononitrate (IMDUR) 60 MG 24 hr tablet Take 60 mg by mouth every day 90 tablet 3  . metoprolol succinate (TOPROL-XL) 100 MG 24 hr tablet Take 1 tablet (100 mg total) by mouth daily. Take with or immediately following a meal. 90 tablet 3  . omega-3 acid ethyl esters (LOVAZA) 1 g capsule Take 2 capsules (2 g total) by mouth 2 (two) times daily. 360 capsule 3  . ONE TOUCH ULTRA TEST test strip Use to monitor FSBS 4x daily for fluctuating blood sugars. Dx: E11.65. 400 each 3   No current facility-administered medications for this visit.     Social History   Socioeconomic History  . Marital status: Single    Spouse name: Not on file  . Number of children: 1  . Years of education: Not on file  . Highest education level: Not on file  Social Needs  . Financial resource strain: Not on file  . Food insecurity - worry: Not on file  . Food insecurity - inability: Not on file  . Transportation needs - medical: Not on file  . Transportation needs - non-medical: Not on file  Occupational History    Employer: [employer]  Tobacco Use  . Smoking status: Former Smoker    Packs/day: 1.50    Years: 35.00    Pack years: 52.50    Types: Cigarettes    Last attempt to quit: 10/24/2007    Years since quitting: 10.0  . Smokeless tobacco: Never Used  Substance and Sexual Activity  . Alcohol use: Yes    Alcohol/week: 0.0 oz    Comment: 04/15/2015 "I've had a couple drinks in the last 8 months"  . Drug use: No  . Sexual activity: Not Currently  Other Topics Concern  . Not on file  Social History Narrative  . Not on file    Family History  Problem Relation Age of Onset  . Breast cancer Mother   . Diabetes type II Mother   . Lung cancer Father    Socially he has one child. There is a remote tobacco history quit in 2009. He is not routinely exercise.  ROS General: Negative; No fevers, chills, or night sweats;  HEENT: Negative; No changes in vision or hearing, sinus congestion, difficulty swallowing Pulmonary: Positive for recent diagnosis of pulmonary fibrosis.  Supplemental oxygen.  Mild shortness of breath with activity.  No cough, wheezing, hemoptysis Cardiovascular: History of WPW/SVT, status post ablation GI: Negative; No nausea, vomiting, diarrhea, or abdominal pain GU: Negative; No dysuria, hematuria, or difficulty voiding Musculoskeletal: Negative; no myalgias, joint pain, or weakness Hematologic/Oncology: Negative; no easy bruising, bleeding Endocrine: Positive for hyperlipidemia , hypothyroidism, and type 2 diabetes mellitus. Neuro: Negative; no changes in balance, headaches Skin: Negative; No rashes or skin lesions Psychiatric: Negative; No behavioral problems, depression Sleep: Negative; No snoring, daytime sleepiness, hypersomnolence, bruxism, restless legs, hypnogognic hallucinations, no cataplexy Other comprehensive 14 point system review is negative.   PE BP 138/83   Pulse 66   Ht 6' 1"  (1.854 m)   Wt 237 lb 12.8 oz (107.9 kg)   BMI 31.37 kg/m    Repeat blood pressure by me was 124/78  Wt Readings from Last 3 Encounters:  11/05/17 237 lb 12.8 oz (107.9 kg)  10/05/17 233 lb (105.7 kg)  08/16/17 220 lb (99.8 kg)   General: Alert, oriented, no distress.  Skin: normal turgor, no rashes, warm and dry HEENT: Normocephalic, atraumatic. Pupils equal round and reactive to light; sclera anicteric; extraocular muscles intact;  Nose without nasal septal hypertrophy Mouth/Parynx benign; Mallinpatti scale 3 Neck: No JVD, no carotid bruits; normal carotid upstroke Lungs: clear to ausculatation and  percussion; no wheezing or rales Chest wall: without tenderness to palpitation Heart: PMI not displaced, RRR, s1 s2 normal, 1/6 systolic murmur, no diastolic murmur, no rubs, gallops, thrills, or heaves Abdomen: soft, nontender; no hepatosplenomehaly, BS+; abdominal aorta nontender and not dilated by palpation. Back: no CVA tenderness Pulses 2+ Musculoskeletal: Bilateral knee scars ;full range of motion, normal strength, no joint deformities Extremities: no clubbing cyanosis or edema, Homan's sign negative  Neurologic: grossly nonfocal; Cranial nerves grossly wnl Psychologic: Normal mood and affect  ECG (independently read by me): Normal sinus rhythm at 66 bpm.  PVCs.  Left axis deviation.  QS complex V1 V2  September 2015 ECG (independently read by me): Sinus rhythm at 69 beats per minute.  Right bundle branch block with repolarization changes. Two  Isolated, unifocal PVC's with a PR interval 108 ms.   05/11/2014 ECG (independently read by me) sinus rhythm at 70 beats per minute with frequent PVCs with evidence for VA conduction.  Old inferior infarct.  Left axis deviation.  Prior ECG: Sinus rhythm with left axis deviation. Incomplete right bundle branch block. Inferior posterior infarction changes.  LABS:  BMP Latest Ref Rng & Units 07/25/2017 08/07/2016 05/24/2016  Glucose 65 - 99 mg/dL 163(H) 88 41(LL)  BUN 7 - 25 mg/dL 25 19 17   Creatinine 0.70 - 1.25 mg/dL 1.27(H) 1.28(H) 2.47(H)  BUN/Creat Ratio 6 - 22 (calc) 20 - -  Sodium 135 - 146 mmol/L 140 143 139  Potassium 3.5 - 5.3 mmol/L 4.0 4.7 3.6  Chloride 98 - 110 mmol/L 101 102 104  CO2 20 - 32 mmol/L 27 31 25   Calcium 8.6 - 10.3 mg/dL 9.9 9.9 9.4   Hepatic Function Latest Ref Rng &  Units 07/25/2017 08/07/2016 05/24/2016  Total Protein 6.1 - 8.1 g/dL 6.7 6.9 6.1(L)  Albumin 3.6 - 5.1 g/dL - 4.1 3.5  AST 10 - 35 U/L 17 17 24   ALT 9 - 46 U/L 22 18 25   Alk Phosphatase 40 - 115 U/L - 53 47  Total Bilirubin 0.2 - 1.2 mg/dL 0.7 0.8 0.7    Bilirubin, Direct 0.0 - 0.3 mg/dL - - -   CBC Latest Ref Rng & Units 07/25/2017 08/07/2016 05/24/2016  WBC 3.8 - 10.8 Thousand/uL 6.3 6.5 8.9  Hemoglobin 13.2 - 17.1 g/dL 15.7 15.3 13.8  Hematocrit 38.5 - 50.0 % 46.2 45.8 41.4  Platelets 140 - 400 Thousand/uL 168 178 160   Lab Results  Component Value Date   MCV 87.7 07/25/2017   MCV 86.6 08/07/2016   MCV 86.8 05/24/2016   Lab Results  Component Value Date   TSH 1.28 07/25/2017   Lipid Panel     Component Value Date/Time   CHOL 150 07/25/2017 1001   TRIG 274 (H) 07/25/2017 1001   HDL 34 (L) 07/25/2017 1001   CHOLHDL 4.4 07/25/2017 1001   VLDL 22 08/07/2016 0934   LDLCALC 61 08/07/2016 0934     RADIOLOGY: No results found.  IMPRESSION: 1. Atherosclerosis of native coronary artery of native heart without angina pectoris   2. Essential hypertension   3. PULMONARY FIBROSIS   4. Diabetes mellitus type 2 without retinopathy ([place])   5. Mixed hyperlipidemia   6. Medication management     ASSESSMENT AND PLAN:  Mr. Gloster is a 62 year old gentleman with established coronary artery disease and is status post intervention to his LAD and circumflex vessel iin 2004 . He had subsequent interventions in 2008 and  in 2011 . A nuclear perfusion study in February 2014  suggested normal perfusion without significant scar or ischemia. The study was not gated secondary to ectopy.  A chest CT in October 2014 suggested bronchiectasis, groundglass attenuation, interstitial reticulation, and honeycombing.  He has  pulmonary fibrosis is followed by Dr. Chase Montgomery.  He is on supplemental oxygen.  He also has a history of WPW with SVT and is status post ablation by Dr. Lovena Montgomery.  His ECG today shows sinus rhythm with occasional PVC and QRS complex anteroseptally.  He is without chest pain, PND, orthopnea.  A 2-D echo Doppler study showed normal systolic function with ejection fraction of 55-60%.  There was grade 2 diastolic dysfunction and evidence for  moderate pulmonary hypertension with estimated PA pressure 48 mm.  His ventricular rate is in the 60s on his current dose of Toprol 100 mg daily.  His blood pressure today is controlled furosemide as needed, isosorbide 60 mg, metoprolol, succinate 50 mg daily.  He is on atorvastatin 40 mg for hyperlipidemia.  His lipid study was reviewed from October 2018 which showed significant triglyceride elevation.  LDL was 61.  I have suggested the addition of omega-3 fatty acid 2 capsules twice a day.  He is diabetic on metformin in addition to insulin.  He continues to be on clopidogrel with the CAD and is without wheezing.  Last echo Doppler study was in August 2015.  I am recommending a follow-up echo Doppler evaluation.  Laboratory be checked in 3 months.  I will see him in the office in 4 months for reevaluation.  Time spent: 30 minutes Troy Sine, MD, [facility]  11/12/2017 12:17 AM

## 2017-11-05 NOTE — Patient Instructions (Signed)
Medication Instructions:  START Lovaza 2 capsules two times daily  Labwork: Please return for FASTING labs in 3 months (CMET, Lipid, HmgA1C)  Testing/Procedures: Your physician has requested that you have an echocardiogram in 3 months. Echocardiography is a painless test that uses sound waves to create images of your heart. It provides your doctor with information about the size and shape of your heart and how well your heart's chambers and valves are working. This procedure takes approximately one hour. There are no restrictions for this procedure.  [facility]  [facility]   Follow-Up: Your physician recommends that you schedule a follow-up appointment in: 4 months with Dr. Tresa EndoKelly.    Any Other Special Instructions Will Be Listed Below (If Applicable).     If you need a refill on your cardiac medications before your next appointment, please call your pharmacy.

## 2017-11-09 ENCOUNTER — Other Ambulatory Visit: Payer: Self-pay | Admitting: Family Medicine

## 2017-11-09 DIAGNOSIS — J449 Chronic obstructive pulmonary disease, unspecified: Secondary | ICD-10-CM | POA: Diagnosis not present

## 2017-11-09 DIAGNOSIS — J8410 Pulmonary fibrosis, unspecified: Secondary | ICD-10-CM | POA: Diagnosis not present

## 2017-11-09 DIAGNOSIS — H25813 Combined forms of age-related cataract, bilateral: Secondary | ICD-10-CM | POA: Diagnosis not present

## 2017-11-09 DIAGNOSIS — J438 Other emphysema: Secondary | ICD-10-CM | POA: Diagnosis not present

## 2017-11-09 DIAGNOSIS — R269 Unspecified abnormalities of gait and mobility: Secondary | ICD-10-CM | POA: Diagnosis not present

## 2017-11-09 DIAGNOSIS — Z794 Long term (current) use of insulin: Principal | ICD-10-CM

## 2017-11-09 DIAGNOSIS — H35371 Puckering of macula, right eye: Secondary | ICD-10-CM | POA: Diagnosis not present

## 2017-11-09 DIAGNOSIS — E1165 Type 2 diabetes mellitus with hyperglycemia: Secondary | ICD-10-CM

## 2017-11-09 DIAGNOSIS — E119 Type 2 diabetes mellitus without complications: Secondary | ICD-10-CM | POA: Diagnosis not present

## 2017-11-09 DIAGNOSIS — H5703 Miosis: Secondary | ICD-10-CM | POA: Diagnosis not present

## 2017-11-09 LAB — HM DIABETES EYE EXAM

## 2017-11-12 ENCOUNTER — Encounter: Payer: Self-pay | Admitting: Cardiovascular Disease

## 2017-11-20 DIAGNOSIS — H2513 Age-related nuclear cataract, bilateral: Secondary | ICD-10-CM | POA: Diagnosis not present

## 2017-11-26 DIAGNOSIS — H21561 Pupillary abnormality, right eye: Secondary | ICD-10-CM | POA: Diagnosis not present

## 2017-11-26 DIAGNOSIS — H2511 Age-related nuclear cataract, right eye: Secondary | ICD-10-CM | POA: Diagnosis not present

## 2017-11-26 DIAGNOSIS — H25811 Combined forms of age-related cataract, right eye: Secondary | ICD-10-CM | POA: Diagnosis not present

## 2017-11-26 DIAGNOSIS — H2181 Floppy iris syndrome: Secondary | ICD-10-CM | POA: Diagnosis not present

## 2017-12-10 DIAGNOSIS — R269 Unspecified abnormalities of gait and mobility: Secondary | ICD-10-CM | POA: Diagnosis not present

## 2017-12-10 DIAGNOSIS — J438 Other emphysema: Secondary | ICD-10-CM | POA: Diagnosis not present

## 2017-12-10 DIAGNOSIS — J449 Chronic obstructive pulmonary disease, unspecified: Secondary | ICD-10-CM | POA: Diagnosis not present

## 2017-12-10 DIAGNOSIS — J8410 Pulmonary fibrosis, unspecified: Secondary | ICD-10-CM | POA: Diagnosis not present

## 2017-12-13 DIAGNOSIS — H2512 Age-related nuclear cataract, left eye: Secondary | ICD-10-CM | POA: Diagnosis not present

## 2017-12-17 DIAGNOSIS — H2181 Floppy iris syndrome: Secondary | ICD-10-CM | POA: Diagnosis not present

## 2017-12-17 DIAGNOSIS — H21562 Pupillary abnormality, left eye: Secondary | ICD-10-CM | POA: Diagnosis not present

## 2017-12-17 DIAGNOSIS — H2512 Age-related nuclear cataract, left eye: Secondary | ICD-10-CM | POA: Diagnosis not present

## 2017-12-17 DIAGNOSIS — H25812 Combined forms of age-related cataract, left eye: Secondary | ICD-10-CM | POA: Diagnosis not present

## 2017-12-20 ENCOUNTER — Encounter: Payer: Self-pay | Admitting: *Deleted

## 2018-01-07 DIAGNOSIS — J449 Chronic obstructive pulmonary disease, unspecified: Secondary | ICD-10-CM | POA: Diagnosis not present

## 2018-01-07 DIAGNOSIS — J8410 Pulmonary fibrosis, unspecified: Secondary | ICD-10-CM | POA: Diagnosis not present

## 2018-01-07 DIAGNOSIS — R269 Unspecified abnormalities of gait and mobility: Secondary | ICD-10-CM | POA: Diagnosis not present

## 2018-01-07 DIAGNOSIS — J438 Other emphysema: Secondary | ICD-10-CM | POA: Diagnosis not present

## 2018-01-08 ENCOUNTER — Other Ambulatory Visit: Payer: Self-pay | Admitting: Family Medicine

## 2018-01-08 NOTE — Telephone Encounter (Signed)
Ok to refill??  Last office visit 10/05/2017.  Last refill 08/16/2017, #1 refills.

## 2018-02-01 ENCOUNTER — Other Ambulatory Visit: Payer: PPO

## 2018-02-01 DIAGNOSIS — E119 Type 2 diabetes mellitus without complications: Secondary | ICD-10-CM

## 2018-02-01 DIAGNOSIS — E785 Hyperlipidemia, unspecified: Secondary | ICD-10-CM

## 2018-02-01 DIAGNOSIS — I10 Essential (primary) hypertension: Secondary | ICD-10-CM

## 2018-02-01 DIAGNOSIS — I25118 Atherosclerotic heart disease of native coronary artery with other forms of angina pectoris: Secondary | ICD-10-CM

## 2018-02-02 LAB — CBC WITH DIFFERENTIAL/PLATELET
BASOS ABS: 48 {cells}/uL (ref 0–200)
Basophils Relative: 0.7 %
EOS PCT: 3.7 %
Eosinophils Absolute: 255 cells/uL (ref 15–500)
HEMATOCRIT: 45.7 % (ref 38.5–50.0)
HEMOGLOBIN: 15.3 g/dL (ref 13.2–17.1)
Lymphs Abs: 1201 cells/uL (ref 850–3900)
MCH: 28.9 pg (ref 27.0–33.0)
MCHC: 33.5 g/dL (ref 32.0–36.0)
MCV: 86.2 fL (ref 80.0–100.0)
MPV: 9.5 fL (ref 7.5–12.5)
Monocytes Relative: 10.8 %
NEUTROS ABS: 4651 {cells}/uL (ref 1500–7800)
NEUTROS PCT: 67.4 %
Platelets: 218 10*3/uL (ref 140–400)
RBC: 5.3 10*6/uL (ref 4.20–5.80)
RDW: 14.3 % (ref 11.0–15.0)
Total Lymphocyte: 17.4 %
WBC: 6.9 10*3/uL (ref 3.8–10.8)
WBCMIX: 745 {cells}/uL (ref 200–950)

## 2018-02-02 LAB — HEMOGLOBIN A1C
EAG (MMOL/L): 11.9 (calc)
Hgb A1c MFr Bld: 9.1 % of total Hgb — ABNORMAL HIGH (ref <5.7)
Mean Plasma Glucose: 214 (calc)

## 2018-02-02 LAB — COMPREHENSIVE METABOLIC PANEL
AG Ratio: 1.4 (calc) (ref 1.0–2.5)
ALBUMIN MSPROF: 4 g/dL (ref 3.6–5.1)
ALKALINE PHOSPHATASE (APISO): 62 U/L (ref 40–115)
ALT: 15 U/L (ref 9–46)
AST: 17 U/L (ref 10–35)
BILIRUBIN TOTAL: 0.8 mg/dL (ref 0.2–1.2)
BUN/Creatinine Ratio: 12 (calc) (ref 6–22)
BUN: 15 mg/dL (ref 7–25)
CALCIUM: 9.1 mg/dL (ref 8.6–10.3)
CHLORIDE: 104 mmol/L (ref 98–110)
CO2: 22 mmol/L (ref 20–32)
Creat: 1.3 mg/dL — ABNORMAL HIGH (ref 0.70–1.25)
Globulin: 2.8 g/dL (calc) (ref 1.9–3.7)
Glucose, Bld: 120 mg/dL — ABNORMAL HIGH (ref 65–99)
POTASSIUM: 4.6 mmol/L (ref 3.5–5.3)
SODIUM: 141 mmol/L (ref 135–146)
TOTAL PROTEIN: 6.8 g/dL (ref 6.1–8.1)

## 2018-02-02 LAB — LIPID PANEL
CHOLESTEROL: 108 mg/dL (ref <200)
HDL: 33 mg/dL — ABNORMAL LOW (ref >40)
LDL Cholesterol (Calc): 54 mg/dL (calc)
Non-HDL Cholesterol (Calc): 75 mg/dL (calc) (ref <130)
Total CHOL/HDL Ratio: 3.3 (calc) (ref <5.0)
Triglycerides: 133 mg/dL (ref <150)

## 2018-02-02 LAB — SPECIMEN COMPROMISED

## 2018-02-04 ENCOUNTER — Other Ambulatory Visit: Payer: Self-pay

## 2018-02-04 ENCOUNTER — Ambulatory Visit (HOSPITAL_COMMUNITY): Payer: PPO | Attending: Cardiology

## 2018-02-04 DIAGNOSIS — E785 Hyperlipidemia, unspecified: Secondary | ICD-10-CM | POA: Insufficient documentation

## 2018-02-04 DIAGNOSIS — I10 Essential (primary) hypertension: Secondary | ICD-10-CM

## 2018-02-04 DIAGNOSIS — I471 Supraventricular tachycardia: Secondary | ICD-10-CM | POA: Diagnosis not present

## 2018-02-04 DIAGNOSIS — J449 Chronic obstructive pulmonary disease, unspecified: Secondary | ICD-10-CM | POA: Diagnosis not present

## 2018-02-04 DIAGNOSIS — I456 Pre-excitation syndrome: Secondary | ICD-10-CM | POA: Diagnosis not present

## 2018-02-04 DIAGNOSIS — Z9981 Dependence on supplemental oxygen: Secondary | ICD-10-CM | POA: Insufficient documentation

## 2018-02-04 DIAGNOSIS — I2510 Atherosclerotic heart disease of native coronary artery without angina pectoris: Secondary | ICD-10-CM

## 2018-02-04 DIAGNOSIS — I4891 Unspecified atrial fibrillation: Secondary | ICD-10-CM | POA: Insufficient documentation

## 2018-02-04 DIAGNOSIS — J8410 Pulmonary fibrosis, unspecified: Secondary | ICD-10-CM | POA: Diagnosis not present

## 2018-02-04 DIAGNOSIS — I348 Other nonrheumatic mitral valve disorders: Secondary | ICD-10-CM | POA: Diagnosis not present

## 2018-02-04 DIAGNOSIS — I119 Hypertensive heart disease without heart failure: Secondary | ICD-10-CM | POA: Diagnosis not present

## 2018-02-04 DIAGNOSIS — E119 Type 2 diabetes mellitus without complications: Secondary | ICD-10-CM | POA: Diagnosis not present

## 2018-02-04 MED ORDER — PERFLUTREN LIPID MICROSPHERE
1.0000 mL | INTRAVENOUS | Status: AC | PRN
  Administered 2018-02-04: 2 mL via INTRAVENOUS

## 2018-02-07 ENCOUNTER — Encounter: Payer: Self-pay | Admitting: Family Medicine

## 2018-02-07 ENCOUNTER — Ambulatory Visit (INDEPENDENT_AMBULATORY_CARE_PROVIDER_SITE_OTHER): Payer: PPO | Admitting: Family Medicine

## 2018-02-07 ENCOUNTER — Other Ambulatory Visit: Payer: Self-pay | Admitting: Family Medicine

## 2018-02-07 VITALS — BP 118/86 | HR 76 | Temp 97.6°F | Resp 16 | Ht 73.0 in | Wt 249.0 lb

## 2018-02-07 DIAGNOSIS — R269 Unspecified abnormalities of gait and mobility: Secondary | ICD-10-CM | POA: Diagnosis not present

## 2018-02-07 DIAGNOSIS — L820 Inflamed seborrheic keratosis: Secondary | ICD-10-CM | POA: Diagnosis not present

## 2018-02-07 DIAGNOSIS — Z9111 Patient's noncompliance with dietary regimen: Secondary | ICD-10-CM

## 2018-02-07 DIAGNOSIS — J8410 Pulmonary fibrosis, unspecified: Secondary | ICD-10-CM

## 2018-02-07 DIAGNOSIS — Z794 Long term (current) use of insulin: Secondary | ICD-10-CM | POA: Diagnosis not present

## 2018-02-07 DIAGNOSIS — E1100 Type 2 diabetes mellitus with hyperosmolarity without nonketotic hyperglycemic-hyperosmolar coma (NKHHC): Secondary | ICD-10-CM

## 2018-02-07 DIAGNOSIS — E782 Mixed hyperlipidemia: Secondary | ICD-10-CM | POA: Diagnosis not present

## 2018-02-07 DIAGNOSIS — J449 Chronic obstructive pulmonary disease, unspecified: Secondary | ICD-10-CM | POA: Diagnosis not present

## 2018-02-07 DIAGNOSIS — L819 Disorder of pigmentation, unspecified: Secondary | ICD-10-CM | POA: Diagnosis not present

## 2018-02-07 DIAGNOSIS — J438 Other emphysema: Secondary | ICD-10-CM | POA: Diagnosis not present

## 2018-02-07 DIAGNOSIS — I10 Essential (primary) hypertension: Secondary | ICD-10-CM

## 2018-02-07 DIAGNOSIS — Z91119 Patient's noncompliance with dietary regimen due to unspecified reason: Secondary | ICD-10-CM

## 2018-02-07 MED ORDER — INSULIN ASPART 100 UNIT/ML ~~LOC~~ SOLN: SUBCUTANEOUS | 5 refills | Status: AC

## 2018-02-07 MED ORDER — DAPAGLIFLOZIN PROPANEDIOL 10 MG PO TABS: 10.0000 mg | ORAL_TABLET | Freq: Every day | ORAL | 0 refills | Status: AC

## 2018-02-07 NOTE — Progress Notes (Signed)
Subjective:    Patient ID: Jerry Montgomery, male    DOB: 07/20/1956, 62 y.o.   MRN: [id number]  HPI Last year, the patient's hemoglobin A1c was found to be extremely elevated at 13.1.  He is currently on Lantus 60 units a day.  He is also taking farxiga 10 mg a day.  He is also on metformin 850 mg p.o. twice daily.  He is supposed to be taking NovoLog 15 units with breakfast, 15 units with lunch, and 20 units with dinner.  He admits that he only does this may be once a day at best.  Hemoglobin A1c has fallen substantially from 13.1 down to 9.1 but still extremely high.  He denies any polyuria, polydipsia, or blurry vision.  He denies any hypoglycemic episode.  He denies any chest pain.  Blood pressures well controlled today at 118/86.  Most recent fasting lab work as listed below   Lab on 02/01/2018  Component Date Value Ref Range Status  . Hgb A1c MFr Bld 02/01/2018 9.1* <5.7 % of total Hgb Final   Comment: For someone without known diabetes, a hemoglobin A1c value of 6.5% or greater indicates that they may have  diabetes and this should be confirmed with a follow-up  test. . For someone with known diabetes, a value <7% indicates  that their diabetes is well controlled and a value  greater than or equal to 7% indicates suboptimal  control. A1c targets should be individualized based on  duration of diabetes, age, comorbid conditions, and  other considerations. . Currently, no consensus exists regarding use of hemoglobin A1c for diagnosis of diabetes for children. .   . Mean Plasma Glucose 02/01/2018 214  (calc) Final  . eAG (mmol/L) 02/01/2018 11.9  (calc) Final  . WBC 02/01/2018 6.9  3.8 - 10.8 Thousand/uL Final  . RBC 02/01/2018 5.30  4.20 - 5.80 Million/uL Final  . Hemoglobin 02/01/2018 15.3  13.2 - 17.1 g/dL Final  . HCT 02/01/2018 45.7  38.5 - 50.0 % Final  . MCV 02/01/2018 86.2  80.0 - 100.0 fL Final  . MCH 02/01/2018 28.9  27.0 - 33.0 pg Final  . MCHC 02/01/2018 33.5  32.0  - 36.0 g/dL Final  . RDW 02/01/2018 14.3  11.0 - 15.0 % Final  . Platelets 02/01/2018 218  140 - 400 Thousand/uL Final  . MPV 02/01/2018 9.5  7.5 - 12.5 fL Final  . Neutro Abs 02/01/2018 4,651  1,500 - 7,800 cells/uL Final  . Lymphs Abs 02/01/2018 1,201  850 - 3,900 cells/uL Final  . WBC mixed population 02/01/2018 745  200 - 950 cells/uL Final  . Eosinophils Absolute 02/01/2018 255  15 - 500 cells/uL Final  . Basophils Absolute 02/01/2018 48  0 - 200 cells/uL Final  . Neutrophils Relative % 02/01/2018 67.4  % Final  . Total Lymphocyte 02/01/2018 17.4  % Final  . Monocytes Relative 02/01/2018 10.8  % Final  . Eosinophils Relative 02/01/2018 3.7  % Final  . Basophils Relative 02/01/2018 0.7  % Final  . Glucose, Bld 02/01/2018 120* 65 - 99 mg/dL Final   Comment: . Whole blood, unspun or partially spun gel barrier tube was received more than 6 hours since collection. A  false elevation of K, Phos and LD as well as a false  decrease in glucose may occur due to prolonged contact  with red cells. . .            Fasting reference interval . For someone  without known diabetes, a glucose value between 100 and 125 mg/dL is consistent with prediabetes and should be confirmed with a follow-up test. .   . BUN 02/01/2018 15  7 - 25 mg/dL Final  . Creat 02/01/2018 1.30* 0.70 - 1.25 mg/dL Final   Comment: For patients >90 years of age, the reference limit for Creatinine is approximately 13% higher for people identified as African-American. .   Havery Moros Ratio 02/01/2018 12  6 - 22 (calc) Final  . Sodium 02/01/2018 141  135 - 146 mmol/L Final  . Potassium 02/01/2018 4.6  3.5 - 5.3 mmol/L Final   Comment: . Whole blood, unspun or partially spun gel barrier tube was received more than 6 hours since collection. A  false elevation of K, Phos and LD as well as a false  decrease in glucose may occur due to prolonged contact  with red cells. .   . Chloride 02/01/2018 104  98 - 110  mmol/L Final  . CO2 02/01/2018 22  20 - 32 mmol/L Final  . Calcium 02/01/2018 9.1  8.6 - 10.3 mg/dL Final  . Total Protein 02/01/2018 6.8  6.1 - 8.1 g/dL Final  . Albumin 02/01/2018 4.0  3.6 - 5.1 g/dL Final  . Globulin 02/01/2018 2.8  1.9 - 3.7 g/dL (calc) Final  . AG Ratio 02/01/2018 1.4  1.0 - 2.5 (calc) Final  . Total Bilirubin 02/01/2018 0.8  0.2 - 1.2 mg/dL Final  . Alkaline phosphatase (APISO) 02/01/2018 62  40 - 115 U/L Final  . AST 02/01/2018 17  10 - 35 U/L Final  . ALT 02/01/2018 15  9 - 46 U/L Final  . Cholesterol 02/01/2018 108  <200 mg/dL Final  . HDL 02/01/2018 33* >40 mg/dL Final  . Triglycerides 02/01/2018 133  <150 mg/dL Final  . LDL Cholesterol (Calc) 02/01/2018 54  mg/dL (calc) Final   Comment: Reference range: <100 . Desirable range <100 mg/dL for primary prevention;   <70 mg/dL for patients with CHD or diabetic patients  with > or = 2 CHD risk factors. Marland Kitchen LDL-C is now calculated using the Martin-Hopkins  calculation, which is a validated novel method providing  better accuracy than the Friedewald equation in the  estimation of LDL-C.  Cresenciano Genre et al. Annamaria Helling. 2542;706(23): 2061-2068  (http://education.[employer].com/faq/FAQ164)   . Total CHOL/HDL Ratio 02/01/2018 3.3  <5.0 (calc) Final  . Non-HDL Cholesterol (Calc) 02/01/2018 75  <130 mg/dL (calc) Final   Comment: For patients with diabetes plus 1 major ASCVD risk  factor, treating to a non-HDL-C goal of <100 mg/dL  (LDL-C of <70 mg/dL) is considered a therapeutic  option.   Marland Kitchen Specimen Integrity 02/01/2018    Final   Comment: . Whole blood, unspun or partially spun gel barrier tube was received more than 6 hours since collection. A  false elevation of K, Phos and LD as well as a false  decrease in glucose may occur due to prolonged contact  with red cells. .    Wife is concerned due to a lesion on his face.  It is approximately 2.5 cm in diameter.  It is located on his left temple.  It has the  characteristic brown plaque-like appearance of a seborrheic keratosis.  However wife states that it is grown from the size of a pencil eraser to the size of a silver dollar and just a few months.  They are requesting a biopsy to rule out underlying malignancy.  Its features are not concerning however the  rate of growth and change is concerning if that is in fact the case  Past Medical History:  Diagnosis Date  . Acute diastolic heart failure ([place])   . Atrial fibrillation ([place])   . Atrial tachycardia ([place])   . CAD (coronary artery disease)   . COPD (chronic obstructive pulmonary disease) ([place])   . Hyperlipidemia   . Hypertension   . Hypothyroidism   . On home oxygen therapy    "3L; 24/7" (04/15/2015)  . Pneumothorax on right 4/11  . Postinflammatory pulmonary fibrosis ([place])   . Sleep apnea    "won't use CPAP" (04/14/2105)  . SVT (supraventricular tachycardia) ([place])    Per Dr. Tanna Furry note  . Type II diabetes mellitus ([place])   . WPW (Wolff-Parkinson-White syndrome)    Per Dr. Tanna Furry Note   Past Surgical History:  Procedure Laterality Date  . BILATERAL VATS ABLATION Right   . CARDIAC CATHETERIZATION  05/03/2007   patent stents  . CARDIAC CATHETERIZATION N/A 05/18/2015   Procedure: Right Heart Cath;  Surgeon: Belva Crome, MD;  Location: [facility];  Service: Cardiovascular;  Laterality: N/A;  . CORONARY ANGIOPLASTY WITH STENT PLACEMENT  03/05/2003; 2008; 01/13/2010   PCI & Stent  LAD & mid CX; stent to CX  (I've got 4 stents total" (04/15/2015)  . ELECTROPHYSIOLOGIC STUDY N/A 04/14/2015   Procedure: SVT Ablation;  Surgeon: Evans Lance, MD;  Location: [facility];  Service: Cardiovascular;  Laterality: N/A;  . KNEE ARTHROSCOPY Right 01/22/2013   Procedure: RIGHT ARTHROSCOPY KNEE WITH DEBRIDEMENT, abrasion chondroplasty of lateral tibial plateau, debridement of partial tear of ACL, menisectomy;  Surgeon: Tobi Bastos, MD;  Location: [facility];  Service: Orthopedics;   Laterality: Right;  . KNEE SURGERY  2012   "reattached quad"  . LUNG LOBECTOMY Right   . SHOULDER ARTHROSCOPY W/ ROTATOR CUFF REPAIR Bilateral   . VIDEO ASSISTED THORACOSCOPY Left 07/19/2015   Procedure: LEFT VIDEO ASSISTED THORACOSCOPY WITH LEFT UPPER AND LOWER LUNG BIOPSY;  Surgeon: Melrose Nakayama, MD;  Location: [facility];  Service: Thoracic;  Laterality: Left;  Marland Kitchen VIDEO BRONCHOSCOPY N/A 07/19/2015   Procedure: VIDEO BRONCHOSCOPY WITH FLUORO;  Surgeon: Melrose Nakayama, MD;  [facility];  Service: Thoracic;  Laterality: N/A;   Current Outpatient Medications on File Prior to Visit  Medication Sig Dispense Refill  . albuterol (PROVENTIL HFA;VENTOLIN HFA) 108 (90 Base) MCG/ACT inhaler Inhale 2 puffs into the lungs every 6 (six) hours as needed for wheezing. 3 Inhaler 3  . ALPRAZolam (XANAX) 0.5 MG tablet Take 1 tablet (0.5 mg total) by mouth at bedtime as needed for anxiety. 30 tablet 0  . aspirin 81 MG tablet Take 81 mg by mouth every morning.     . blood glucose meter kit and supplies KIT Dispense based on patient and insurance preference. Use up to four times daily as directed. (FOR ICD E11.65) 1 each 0  . Blood Glucose Monitoring Suppl (ONE TOUCH ULTRA 2) w/Device KIT Use to monitor FSBS 4x daily for fluctuating blood sugars. Dx: E11.65. 1 each 0  . Blood Glucose Monitoring Suppl (ONE TOUCH ULTRA SYSTEM KIT) w/Device KIT Use to monitor FSBS 4x daily for fluctuating blood sugars. Dx: E11.65. 1 each 1  . clopidogrel (PLAVIX) 75 MG tablet Take 1 tablet by mouth every day 90 tablet 3  . furosemide (LASIX) 20 MG tablet Take 1 tablet (20 mg total) by mouth daily as needed for fluid (fluid). 90 tablet 3  .  insulin glargine (LANTUS) 100 UNIT/ML injection Inject 0.6 mLs (60 Units total) into the skin at bedtime. 60 mL 3  . isosorbide mononitrate (IMDUR) 60 MG 24 hr tablet Take 60 mg by mouth every day 90 tablet 3  . levothyroxine (SYNTHROID, LEVOTHROID) 125 MCG tablet Take 1 tablet (125 mcg  total) by mouth daily. 90 tablet 3  . Menthol, Topical Analgesic, (BIOFREEZE EX) Apply 1 application topically as needed (for pain).    . metFORMIN (GLUCOPHAGE) 850 MG tablet Take 1 tablet by mouth twice a day 180 tablet 1  . metoprolol succinate (TOPROL-XL) 50 MG 24 hr tablet Take 1 tablet (50 mg total) by mouth daily. Take with or immediately following a meal. 90 tablet 3  . nitroGLYCERIN (NITROSTAT) 0.4 MG SL tablet Place 1 tablet (0.4 mg total) under the tongue every 5 (five) minutes as needed for chest pain. 25 tablet 1  . omega-3 acid ethyl esters (LOVAZA) 1 g capsule Take 2 capsules (2 g total) by mouth 2 (two) times daily. 360 capsule 3  . ONE TOUCH ULTRA TEST test strip Use to monitor FSBS 4x daily for fluctuating blood sugars. Dx: E11.65. 400 each 3  . ONETOUCH DELICA LANCETS FINE MISC Test up to 4 times a day as directed. 100 each 5  . SYRINGE/NEEDLE, DISP, 1 ML (B-D SYRINGE/NEEDLE 1CC/25GX5/8) 25G X 5/8" 1 ML MISC As directed 100 each 5  . tiotropium (SPIRIVA) 18 MCG inhalation capsule Place 18 mcg into inhaler and inhale daily. Reported on 02/25/2016    . zolpidem (AMBIEN) 10 MG tablet Take 1 tablet by mouth at bedtime. 90 tablet 0  . metoprolol succinate (TOPROL-XL) 100 MG 24 hr tablet Take 1 tablet (100 mg total) by mouth daily. Take with or immediately following a meal. (Patient not taking: Reported on 02/07/2018) 90 tablet 3   No current facility-administered medications on file prior to visit.    Allergies  Allergen Reactions  . Niaspan [Niacin] Itching  . Ofev [Nintedanib] Other (See Comments)    Lethargic, wiped out  . Pirfenidone   . Altace [Ramipril] Other (See Comments)    Cough    Social History   Socioeconomic History  . Marital status: Single    Spouse name: Not on file  . Number of children: 1  . Years of education: Not on file  . Highest education level: Not on file  Occupational History    Employer: [employer]eds  . Financial resource  strain: Not on file  . Food insecurity:    Worry: Not on file    Inability: Not on file  . Transportation needs:    Medical: Not on file    Non-medical: Not on file  Tobacco Use  . Smoking status: Former Smoker    Packs/day: 1.50    Years: 35.00    Pack years: 52.50    Types: Cigarettes    Last attempt to quit: 10/24/2007    Years since quitting: 10.2  . Smokeless tobacco: Never Used  Substance and Sexual Activity  . Alcohol use: Yes    Alcohol/week: 0.0 oz    Comment: 04/15/2015 "I've had a couple drinks in the last 8 months"  . Drug use: No  . Sexual activity: Not Currently  Lifestyle  . Physical activity:    Days per week: Not on file    Minutes per session: Not on file  . Stress: Not on file  Relationships  . Social connections:    Talks on  phone: Not on file    Gets together: Not on file    Attends religious service: Not on file    Active member of club or organization: Not on file    Attends meetings of clubs or organizations: Not on file    Relationship status: Not on file  . Intimate partner violence:    Fear of current or ex partner: Not on file    Emotionally abused: Not on file    Physically abused: Not on file    Forced sexual activity: Not on file  Other Topics Concern  . Not on file  Social History Narrative  . Not on file     Review of Systems  All other systems reviewed and are negative.      Objective:   Physical Exam  Constitutional: He appears well-developed and well-nourished.  HENT:  Head:    Neck: No JVD present.  Cardiovascular: Normal rate, regular rhythm and normal heart sounds.  Pulmonary/Chest: He has wheezes. He has no rales.  Abdominal: Soft. Bowel sounds are normal. He exhibits no distension. There is no tenderness. There is no rebound.  Musculoskeletal: He exhibits no edema.  Vitals reviewed.         Assessment & Plan:  Change in pigmented skin lesion of face - Plan: Pathology  Uncontrolled type 2 diabetes mellitus  with hyperosmolarity without coma, with long-term current use of insulin (HCC)  Pulmonary fibrosis (HCC)  Dietary noncompliance  Mixed hyperlipidemia  Essential hypertension  Patient's diabetes is better controlled than it was in the winter of last year.  However it still poorly controlled.  I congratulated him on the improvement however I stated that he must be compliant with his mealtime insulin.  He is missing roughly 40-50 units of insulin per day that he is supposed to be taking.  I believe that if he does this, his hemoglobin A1c will drop much further.  The other option would be switching the patient to Trulicity away from mealtime insulin.  Patient agrees to continue all of his medication however he agrees to start taking NovoLog 15 units with breakfast, 15 units with lunch, and 20 units with dinner and then recheck sugars in 2 weeks having recorded sugars with every meal so that we can make further titrations.  Blood pressure and cholesterol are outstanding.  I believe the lesion on his left temple is most likely a seborrheic keratosis.  It has no concerning features other than the rate of growth which I agree is abnormal.  I anesthetized the anterior portion of this and performed a 1 cm shave biopsy down to the underlying dermal tissue.  This was sent to pathology in a labeled container.  Hemostasis was achieved with Drysol and a Band-Aid.  If there are concerning features, we can refer the patient to a dermatologist for complete excision.

## 2018-02-11 LAB — TISSUE SPECIMEN

## 2018-02-11 LAB — PATHOLOGY

## 2018-02-12 ENCOUNTER — Encounter (INDEPENDENT_AMBULATORY_CARE_PROVIDER_SITE_OTHER): Payer: Self-pay

## 2018-03-05 ENCOUNTER — Encounter: Payer: Self-pay | Admitting: Cardiovascular Disease

## 2018-03-05 ENCOUNTER — Ambulatory Visit: Payer: PPO | Admitting: Cardiovascular Disease

## 2018-03-05 VITALS — BP 115/76 | HR 57 | Ht 73.5 in | Wt 249.6 lb

## 2018-03-05 DIAGNOSIS — E119 Type 2 diabetes mellitus without complications: Secondary | ICD-10-CM

## 2018-03-05 DIAGNOSIS — E785 Hyperlipidemia, unspecified: Secondary | ICD-10-CM

## 2018-03-05 DIAGNOSIS — I2510 Atherosclerotic heart disease of native coronary artery without angina pectoris: Secondary | ICD-10-CM

## 2018-03-05 DIAGNOSIS — J8410 Pulmonary fibrosis, unspecified: Secondary | ICD-10-CM

## 2018-03-05 DIAGNOSIS — E039 Hypothyroidism, unspecified: Secondary | ICD-10-CM

## 2018-03-05 NOTE — Patient Instructions (Signed)
Medication Instructions:  Your physician recommends that you continue on your current medications as directed. Please refer to the Current Medication list given to you today.  Follow-Up: Your physician wants you to follow-up in: 1 year with Dr. Kelly.  You will receive a reminder letter in the mail two months in advance. If you don't receive a letter, please call our office to schedule the follow-up appointment.    If you need a refill on your cardiac medications before your next appointment, please call your pharmacy.   

## 2018-03-05 NOTE — Progress Notes (Signed)
Patient ID: PRINCETON NABOR, male   DOB: 01/30/1956, 62 y.o.   MRN: [id number]     HPI: Jerry Montgomery is a 62 y.o. male who presents for a 4 month cardiology evaluation with me.  Jerry Montgomery has history of known coronary artery disease in May 2004 underwent stenting of his proximal LAD and proximal circumflex coronary arteries. In July 2008 he developed a new 85% stenosis of the mid circumflex vessel at which time a 3.5x13 mm Cypher stent was inserted. His 2 previously placed stents remain patent. At his last catheterization in March 2011 he was found to have 95% stenosis between the tube previously placed stents in the circumflex and a new 3.0x20 mm post and was inserted to cover the mid area and also cover the proximal region. His LAD stent was patent with 40% proximal and mid stenosis obtuse marginal 1 vessel at 60-70% narrowing the distal circumflex had 30% narrowing and a nondominant right coronary artery had 80% stenosis. In 2012, he ruptured his quadriceps tendon and underwent surgery by Dr. Gladstone Lighter and tolerated this well from a cardiac standpoint. He does have a remote history of tobacco use. Has a history of hyperlipidemia, hypertension, type 2 diabetes mellitus, as well as emphysematous blebs. In May 2001 he underwent resection of apical bullae pleurectomy and pleurocentesis. He had been followed by Dr. Melvyn Novas for pulmonary care and now some care of Dr. Donzetta Kohut at [facility]. Apparently he was started on oxygen. He denies any anginal symptoms. Is unaware of tachycardia dysrhythmias.  He has been diagnosed with pulmonary fibrosis.  He has been on home oxygen since December 2014.  He denies anginal symptoms.  He is unaware of wheezing.  He is unaware of palpitations.  He does note shortness of breath with activity.  He denies any claudication.  He denies any presyncope or syncope.  Laboratory from September 2015 showed a cholesterol of 113, triglycerides 152, HDL 28, LDL 55 on his  atorvastatin 40 mg.  Hemoglobin A1c was increased at 8.4.  His TSH was slightly increased at 5.5 and his Synthroid dose was just increased from 75 mcg to 100 mcg daily.  An echo Doppler study on 06/10/2014 showed an ejection fraction of 55-60%.  There was grade 2 diastolic dysfunction.  He had moderate left atrial dilatation.  There was moderate pulmonary hypertension with a PA pressure 48 mm.    He is on supplemental oxygen for pulmonary fibrosis and is followed by Dr. Chase Caller.  He is also followed by Dr. Lovena Le for Wolff-Parkinson-White syndrome and SVT.  He underwent an EP study and catheter ablation of the left posterior accessory pathway and since his ablation he denies any recurrent arrhythmia.  Since I last saw him in January 2019 after not having seen him since 2015 he denies any episodes of chest pain.  He is unaware of palpitations andhas the Kardia app on his phone to monitor for atrial fibrillation.  I reviewed his lipid studies which showed significant triglyceride elevation.  I recommended the addition of the Cipro 2 capsules twice a day.  Since his last echo Doppler study was in 2015 I recommended a follow-up evaluation.  His echo study was done in February 04, 2018.  EF was 40 to 45%.  There was grade 2 diastolic dysfunction.  There was mild mitral annular calcification.  His left atrium was moderately dilated.  Clinically, he has continued to feel well.  He continues to use supplemental oxygen.  He denies recurrent  chest pain.  He is followed by pulmonary for his pulmonary fibrosis.  He also sees Dr. Dennard Schaumann.  He denies anginal type symptoms.  Laboratory in April 2019 showed LDL at 54 on atorvastatin 40 mg.  He is diabetic on insulin, for [employer], and metformin.  He presents for evaluation  Past Medical History:  Diagnosis Date  . Acute diastolic heart failure ([place])   . Atrial fibrillation ([place])   . Atrial tachycardia ([place])   . CAD (coronary artery disease)   . COPD (chronic obstructive  pulmonary disease) ([place])   . Hyperlipidemia   . Hypertension   . Hypothyroidism   . On home oxygen therapy    "3L; 24/7" (04/15/2015)  . Pneumothorax on right 4/11  . Postinflammatory pulmonary fibrosis ([place])   . Sleep apnea    "won't use CPAP" (04/14/2105)  . SVT (supraventricular tachycardia) ([place])    Per Dr. Tanna Furry note  . Type II diabetes mellitus ([place])   . WPW (Wolff-Parkinson-White syndrome)    Per Dr. Tanna Furry Note    Past Surgical History:  Procedure Laterality Date  . BILATERAL VATS ABLATION Right   . CARDIAC CATHETERIZATION  05/03/2007   patent stents  . CARDIAC CATHETERIZATION N/A 05/18/2015   Procedure: Right Heart Cath;  Surgeon: Belva Crome, MD;  Location: [place] [facility];  Service: Cardiovascular;  Laterality: N/A;  . CORONARY ANGIOPLASTY WITH STENT PLACEMENT  03/05/2003; 2008; 01/13/2010   PCI & Stent  LAD & mid CX; stent to CX  (I've got 4 stents total" (04/15/2015)  . ELECTROPHYSIOLOGIC STUDY N/A 04/14/2015   Procedure: SVT Ablation;  Surgeon: Evans Lance, MD;  Location: [place] [facility];  Service: Cardiovascular;  Laterality: N/A;  . KNEE ARTHROSCOPY Right 01/22/2013   Procedure: RIGHT ARTHROSCOPY KNEE WITH DEBRIDEMENT, abrasion chondroplasty of lateral tibial plateau, debridement of partial tear of ACL, menisectomy;  Surgeon: Tobi Bastos, MD;  Location: [facility];  Service: Orthopedics;  Laterality: Right;  . KNEE SURGERY  2012   "reattached quad"  . LUNG LOBECTOMY Right   . SHOULDER ARTHROSCOPY W/ ROTATOR CUFF REPAIR Bilateral   . VIDEO ASSISTED THORACOSCOPY Left 07/19/2015   Procedure: LEFT VIDEO ASSISTED THORACOSCOPY WITH LEFT UPPER AND LOWER LUNG BIOPSY;  Surgeon: Melrose Nakayama, MD;  Location: [place];  Service: Thoracic;  Laterality: Left;  Marland Kitchen VIDEO BRONCHOSCOPY N/A 07/19/2015   Procedure: VIDEO BRONCHOSCOPY WITH FLUORO;  Surgeon: Melrose Nakayama, MD;  Location: [facility];  Service: Thoracic;  Laterality: N/A;    Allergies  Allergen Reactions    . Niaspan [Niacin] Itching  . Ofev [Nintedanib] Other (See Comments)    Lethargic, wiped out  . Pirfenidone   . Altace [Ramipril] Other (See Comments)    Cough     Current Outpatient Medications  Medication Sig Dispense Refill  . albuterol (PROVENTIL HFA;VENTOLIN HFA) 108 (90 Base) MCG/ACT inhaler Inhale 2 puffs into the lungs every 6 (six) hours as needed for wheezing. 3 Inhaler 3  . ALPRAZolam (XANAX) 0.5 MG tablet Take 1 tablet (0.5 mg total) by mouth at bedtime as needed for anxiety. 30 tablet 0  . aspirin 81 MG tablet Take 81 mg by mouth every morning.     Marland Kitchen atorvastatin (LIPITOR) 40 MG tablet Take 1 tablet by mouth every night at bedtime 30 tablet 3  . blood glucose meter kit and supplies KIT Dispense based on patient and insurance preference. Use up to four times daily as directed. (FOR ICD E11.65) 1 each 0  .  Blood Glucose Monitoring Suppl (ONE TOUCH ULTRA 2) w/Device KIT Use to monitor FSBS 4x daily for fluctuating blood sugars. Dx: E11.65. 1 each 0  . Blood Glucose Monitoring Suppl (ONE TOUCH ULTRA SYSTEM KIT) w/Device KIT Use to monitor FSBS 4x daily for fluctuating blood sugars. Dx: E11.65. 1 each 1  . clopidogrel (PLAVIX) 75 MG tablet Take 1 tablet by mouth every day 90 tablet 3  . dapagliflozin propanediol (FARXIGA) 10 MG TABS tablet Take 10 mg by mouth daily. 30 tablet 0  . furosemide (LASIX) 20 MG tablet Take 1 tablet (20 mg total) by mouth daily as needed for fluid (fluid). 90 tablet 3  . insulin aspart (NOVOLOG) 100 UNIT/ML injection 15 UNITS WITH BREAKFAST AND 15 UNITS WITH LUNCH, 20 UNITS WITH SUPPER 60 mL 5  . insulin glargine (LANTUS) 100 UNIT/ML injection Inject 0.6 mLs (60 Units total) into the skin at bedtime. 60 mL 3  . isosorbide mononitrate (IMDUR) 60 MG 24 hr tablet Take 60 mg by mouth every day 90 tablet 3  . levothyroxine (SYNTHROID, LEVOTHROID) 125 MCG tablet Take 1 tablet (125 mcg total) by mouth daily. 90 tablet 3  . Menthol, Topical Analgesic, (BIOFREEZE  EX) Apply 1 application topically as needed (for pain).    . metFORMIN (GLUCOPHAGE) 850 MG tablet Take 1 tablet by mouth twice a day 180 tablet 1  . metoprolol succinate (TOPROL-XL) 100 MG 24 hr tablet Take 1 tablet (100 mg total) by mouth daily. Take with or immediately following a meal. 90 tablet 3  . metoprolol succinate (TOPROL-XL) 50 MG 24 hr tablet Take 1 tablet (50 mg total) by mouth daily. Take with or immediately following a meal. 90 tablet 3  . nitroGLYCERIN (NITROSTAT) 0.4 MG SL tablet Place 1 tablet (0.4 mg total) under the tongue every 5 (five) minutes as needed for chest pain. 25 tablet 1  . omega-3 acid ethyl esters (LOVAZA) 1 g capsule Take 2 capsules (2 g total) by mouth 2 (two) times daily. 360 capsule 3  . ONE TOUCH ULTRA TEST test strip Use to monitor FSBS 4x daily for fluctuating blood sugars. Dx: E11.65. 400 each 3  . ONETOUCH DELICA LANCETS FINE MISC Test up to 4 times a day as directed. 100 each 5  . SYRINGE/NEEDLE, DISP, 1 ML (B-D SYRINGE/NEEDLE 1CC/25GX5/8) 25G X 5/8" 1 ML MISC As directed 100 each 5  . tiotropium (SPIRIVA) 18 MCG inhalation capsule Place 18 mcg into inhaler and inhale daily. Reported on 02/25/2016    . zolpidem (AMBIEN) 10 MG tablet Take 1 tablet by mouth at bedtime. 90 tablet 0   No current facility-administered medications for this visit.     Social History   Socioeconomic History  . Marital status: Single    Spouse name: Not on file  . Number of children: 1  . Years of education: Not on file  . Highest education level: Not on file  Occupational History    Employer: [employer]  . Financial resource strain: Not on file  . Food insecurity:    Worry: Not on file    Inability: Not on file  . Transportation needs:    Medical: Not on file    Non-medical: Not on file  Tobacco Use  . Smoking status: Former Smoker    Packs/day: 1.50    Years: 35.00    Pack years: 52.50    Types: Cigarettes    Last attempt to quit: 10/24/2007      Years  since quitting: 10.3  . Smokeless tobacco: Never Used  Substance and Sexual Activity  . Alcohol use: Yes    Alcohol/week: 0.0 oz    Comment: 04/15/2015 "I've had a couple drinks in the last 8 months"  . Drug use: No  . Sexual activity: Not Currently  Lifestyle  . Physical activity:    Days per week: Not on file    Minutes per session: Not on file  . Stress: Not on file  Relationships  . Social connections:    Talks on phone: Not on file    Gets together: Not on file    Attends religious service: Not on file    Active member of club or organization: Not on file    Attends meetings of clubs or organizations: Not on file    Relationship status: Not on file  . Intimate partner violence:    Fear of current or ex partner: Not on file    Emotionally abused: Not on file    Physically abused: Not on file    Forced sexual activity: Not on file  Other Topics Concern  . Not on file  Social History Narrative  . Not on file    Family History  Problem Relation Age of Onset  . Breast cancer Mother   . Diabetes type II Mother   . Lung cancer Father    Socially he has one child. There is a remote tobacco history quit in 2009. He is not routinely exercise.  ROS General: Negative; No fevers, chills, or night sweats;  HEENT: Negative; No changes in vision or hearing, sinus congestion, difficulty swallowing Pulmonary: Positive for recent diagnosis of pulmonary fibrosis.  Supplemental oxygen.  Mild shortness of breath with activity.  No cough, wheezing, hemoptysis Cardiovascular: History of WPW/SVT, status post ablation GI: Negative; No nausea, vomiting, diarrhea, or abdominal pain GU: Negative; No dysuria, hematuria, or difficulty voiding Musculoskeletal: Negative; no myalgias, joint pain, or weakness Hematologic/Oncology: Negative; no easy bruising, bleeding Endocrine: Positive for hyperlipidemia , hypothyroidism, and type 2 diabetes mellitus. Neuro: Negative; no changes in  balance, headaches Skin: Negative; No rashes or skin lesions Psychiatric: Negative; No behavioral problems, depression Sleep: Negative; No snoring, daytime sleepiness, hypersomnolence, bruxism, restless legs, hypnogognic hallucinations, no cataplexy Other comprehensive 14 point system review is negative.   PE BP 115/76   Pulse (!) 57   Ht 6' 1.5" (1.867 m)   Wt 249 lb 9.6 oz (113.2 kg)   BMI 32.48 kg/m    Repeat blood pressure by me was 112/78  Wt Readings from Last 3 Encounters:  03/05/18 249 lb 9.6 oz (113.2 kg)  02/07/18 249 lb (112.9 kg)  11/05/17 237 lb 12.8 oz (107.9 kg)   General: Alert, oriented, no distress.  Skin: normal turgor, no rashes, warm and dry HEENT: Normocephalic, atraumatic. Pupils equal round and reactive to light; sclera anicteric; extraocular muscles intact;  Nose without nasal septal hypertrophy Mouth/Parynx benign; Mallinpatti scale Neck: No JVD, no carotid bruits; normal carotid upstroke Lungs: clear to ausculatation and percussion; no wheezing or rales Chest wall: without tenderness to palpitation Heart: PMI not displaced, RRR, s1 s2 normal, 1/6 systolic murmur, no diastolic murmur, no rubs, gallops, thrills, or heaves Abdomen: soft, nontender; no hepatosplenomehaly, BS+; abdominal aorta nontender and not dilated by palpation. Back: no CVA tenderness Pulses 2+ Musculoskeletal: Bilateral knee scars; full range of motion, normal strength, no joint deformities Extremities: no clubbing cyanosis or edema, Homan's sign negative  Neurologic: grossly nonfocal; Cranial nerves grossly wnl  Psychologic: Normal mood and affect   ECG (independently read by me): Sinus bradycardia 57 bpm.  PAC.  February 2019 ECG (independently read by me): Normal sinus rhythm at 66 bpm.  PVCs.  Left axis deviation.  QS complex V1 V2  September 2015 ECG (independently read by me): Sinus rhythm at 69 beats per minute.  Right bundle branch block with repolarization changes. Two   Isolated, unifocal PVC's with a PR interval 108 ms.   05/11/2014 ECG (independently read by me) sinus rhythm at 70 beats per minute with frequent PVCs with evidence for VA conduction.  Old inferior infarct.  Left axis deviation.  Prior ECG: Sinus rhythm with left axis deviation. Incomplete right bundle branch block. Inferior posterior infarction changes.  LABS:  BMP Latest Ref Rng & Units 02/01/2018 07/25/2017 08/07/2016  Glucose 65 - 99 mg/dL 120(H) 163(H) 88  BUN 7 - 25 mg/dL 15 25 19   Creatinine 0.70 - 1.25 mg/dL 1.30(H) 1.27(H) 1.28(H)  BUN/Creat Ratio 6 - 22 (calc) 12 20 -  Sodium 135 - 146 mmol/L 141 140 143  Potassium 3.5 - 5.3 mmol/L 4.6 4.0 4.7  Chloride 98 - 110 mmol/L 104 101 102  CO2 20 - 32 mmol/L 22 27 31   Calcium 8.6 - 10.3 mg/dL 9.1 9.9 9.9   Hepatic Function Latest Ref Rng & Units 02/01/2018 07/25/2017 08/07/2016  Total Protein 6.1 - 8.1 g/dL 6.8 6.7 6.9  Albumin 3.6 - 5.1 g/dL - - 4.1  AST 10 - 35 U/L 17 17 17   ALT 9 - 46 U/L 15 22 18   Alk Phosphatase 40 - 115 U/L - - 53  Total Bilirubin 0.2 - 1.2 mg/dL 0.8 0.7 0.8  Bilirubin, Direct 0.0 - 0.3 mg/dL - - -   CBC Latest Ref Rng & Units 02/01/2018 07/25/2017 08/07/2016  WBC 3.8 - 10.8 Thousand/uL 6.9 6.3 6.5  Hemoglobin 13.2 - 17.1 g/dL 15.3 15.7 15.3  Hematocrit 38.5 - 50.0 % 45.7 46.2 45.8  Platelets 140 - 400 Thousand/uL 218 168 178   Lab Results  Component Value Date   MCV 86.2 02/01/2018   MCV 87.7 07/25/2017   MCV 86.6 08/07/2016   Lab Results  Component Value Date   TSH 1.28 07/25/2017   Lipid Panel     Component Value Date/Time   CHOL 108 02/01/2018 1042   TRIG 133 02/01/2018 1042   HDL 33 (L) 02/01/2018 1042   CHOLHDL 3.3 02/01/2018 1042   VLDL 22 08/07/2016 0934   LDLCALC 54 02/01/2018 1042     RADIOLOGY: No results found.  IMPRESSION: 1. CAD in native artery   2. PULMONARY FIBROSIS   3. Atherosclerosis of native coronary artery of native heart without angina pectoris   4. Diabetes  mellitus type 2 without retinopathy ([place])   5. Hypothyroidism, unspecified type   6. Hyperlipidemia with target LDL less than 70     ASSESSMENT AND PLAN:  Jerry Montgomery is a 61 year old gentleman with established coronary artery disease and is status post intervention to his LAD and circumflex vessel iin 2004 . He had subsequent interventions in 2008 and  in 2011 . A nuclear perfusion study in February 2014  suggested normal perfusion without significant scar or ischemia. The study was not gated secondary to ectopy.  A chest CT in October 2014 suggested bronchiectasis, groundglass attenuation, interstitial reticulation, and honeycombing.  He has  pulmonary fibrosis is followed by Dr. Chase Caller.  He is on supplemental oxygen.  He also has a history of WPW  with SVT and is status post ablation by Dr. Lovena Le.  Since his ablation, he is unaware of any tachydysrhythmias.  His blood pressure today is stable on his medical regimen consisting of isosorbide 60 mg, and Toprol-XL.  At times he admits to intermittent feet swelling with sweating.  He takes furosemide on an as-needed basis.  I reviewed his echo Doppler study which shows an EF of 40 to 45% and grade 2 diastolic dysfunction.  I reviewed his most recent lipid studies.  LDL cholesterol is 54 on his regimen consisting of atorvastatin 40 mg and omega-3 fatty acid.  He is diabetic on Lantus insulin, NovoLog insulin, and also is on for [place].  I discussed with him cardiovascular benefit associated with for [employer].  He has pulmonary fibrosis and is on Spiriva in addition to supplemental oxygen.  He continues to take levothyroxine 125 mcg for hypothyroidism.  He will be seeing Dr. Dennard Schaumann for follow-up evaluation.  He will also be following up with pulmonology with reference to his pulmonary fibrosis.  He is not having anginal symptoms or recurrent tachydysrhythmia.  I will see him in 1 year for further evaluation.  Time spent: 30 minutes Jerry Sine, MD, [facility]    03/07/2018 11:27 AM

## 2018-03-07 ENCOUNTER — Encounter: Payer: Self-pay | Admitting: Cardiovascular Disease

## 2018-03-07 MED ORDER — OMEGA-3-ACID ETHYL ESTERS 1 G PO CAPS: 2.0000 g | ORAL_CAPSULE | Freq: Two times a day (BID) | ORAL | 3 refills | Status: AC

## 2018-03-09 DIAGNOSIS — J438 Other emphysema: Secondary | ICD-10-CM | POA: Diagnosis not present

## 2018-03-09 DIAGNOSIS — R269 Unspecified abnormalities of gait and mobility: Secondary | ICD-10-CM | POA: Diagnosis not present

## 2018-03-09 DIAGNOSIS — J449 Chronic obstructive pulmonary disease, unspecified: Secondary | ICD-10-CM | POA: Diagnosis not present

## 2018-03-09 DIAGNOSIS — J8410 Pulmonary fibrosis, unspecified: Secondary | ICD-10-CM | POA: Diagnosis not present

## 2018-04-02 ENCOUNTER — Telehealth: Payer: Self-pay | Admitting: Internal Medicine

## 2018-04-02 DIAGNOSIS — J84112 Idiopathic pulmonary fibrosis: Secondary | ICD-10-CM

## 2018-04-02 NOTE — Telephone Encounter (Signed)
Jerry BurtonEmily  Montgomery seen Jerry Montgomery in over 6 months or longer. Please have him do Pre-bd spiro and dlco only. No lung volume or bd response. No post-bd spiro and come and see me asap in ILD clinic or regular clinic 30 min slot  - in June 2019. Let him know that a) like to see him; b) want to discuss about more care options esp with research protocol  Thanks  Dr. Kalman ShanMurali Jonnie Montgomery, M.D., [facility]P Pulmonary and Critical Care Medicine Staff Physician, [facility] Director - Interstitial Lung Disease  Program  Pulmonary Fibrosis [facility] [place], [place], [id number]  Pager: [phone], If no answer or between  15:00h - 7:00h: call [phone] Telephone: [phone]

## 2018-04-03 NOTE — Telephone Encounter (Signed)
Spoke with patient, made appt for 30 minute pft and office follow up 04/04/18 at 130pm. No further questions or concerns at this time.

## 2018-04-04 ENCOUNTER — Ambulatory Visit: Payer: PPO | Admitting: Internal Medicine

## 2018-04-04 ENCOUNTER — Telehealth: Payer: Self-pay | Admitting: Internal Medicine

## 2018-04-04 ENCOUNTER — Encounter: Payer: Self-pay | Admitting: Internal Medicine

## 2018-04-04 ENCOUNTER — Ambulatory Visit (INDEPENDENT_AMBULATORY_CARE_PROVIDER_SITE_OTHER): Payer: PPO | Admitting: Internal Medicine

## 2018-04-04 VITALS — BP 130/80 | HR 75 | Ht 72.0 in | Wt 257.0 lb

## 2018-04-04 DIAGNOSIS — J84112 Idiopathic pulmonary fibrosis: Secondary | ICD-10-CM

## 2018-04-04 DIAGNOSIS — J9611 Chronic respiratory failure with hypoxia: Secondary | ICD-10-CM | POA: Diagnosis not present

## 2018-04-04 DIAGNOSIS — J849 Interstitial pulmonary disease, unspecified: Secondary | ICD-10-CM | POA: Diagnosis not present

## 2018-04-04 LAB — PULMONARY FUNCTION TEST
DL/VA % PRED: 57 %
DL/VA: 2.7 ml/min/mmHg/L
DLCO UNC % PRED: 34 %
DLCO UNC: 11.92 ml/min/mmHg
DLCO cor % pred: 33 %
DLCO cor: 11.69 ml/min/mmHg
FEF 25-75 PRE: 1.59 L/s
FEF2575-%Pred-Pre: 53 %
FEV1-%PRED-PRE: 61 %
FEV1-Pre: 2.29 L
FEV1FVC-%PRED-PRE: 98 %
FEV6-%PRED-PRE: 64 %
FEV6-PRE: 3.07 L
FEV6FVC-%Pred-Pre: 104 %
FVC-%Pred-Pre: 62 %
FVC-Pre: 3.1 L
PRE FEV1/FVC RATIO: 74 %
Pre FEV6/FVC Ratio: 99 %

## 2018-04-04 NOTE — Progress Notes (Addendum)
Subjective:     Patient ID: Jerry Montgomery, male   DOB: [birth year], 62 y.o.   MRN: [id number]  HPI   OV 12/25/2016  Chief Complaint  Patient presents with  . 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 He says he uses 6-8 L of oxygen continuously at rest and with exertion. There is no associated chest pain or edema or weight loss. He is not interested in pulmonary transplant . There are no new issues.    OV 04/04/2018  Chief Complaint  Patient presents with  . Follow-up    PFT performed today.  Pt states he has been doing well. States he has been getting more tired recently.     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 IPF dx of SLBx 07/19/15   I personally have not seen Jerry Montgomery 62 y.o. in over 6 months and even close to 1 year.  In between he did do a research visit for which he screen failed.  This was with [employer].  He continues to be on basic supportive care after having failed both nintedanib and pirfenidone because of side effects.  He absolutely does not want to go on these drugs again.  At this point in time he says he continues to have a good quality of life using 6 to 8 L of oxygen between rest and exertion and sleep.  Although and returned his oxygen off and we tested him he was norma- oxic on room air at rest and only desaturated at the end of the second lap as documented below.  His primary function test shows a 9.6% decline since his most recent pulmonary function test in October 2018 and this is over a 10% decline since September 2018 and July 2018.  He agrees that he has also declined and that his effort tolerance is a little bit worse.  He is  interested in research protocols as his next best care option.  He has gained weight in the interim.  We discussed several research protocols that are either currently available or upcoming at our research affiliate [facility].  He is interested in IV infusion monoclonal antibody study that is moving into phase 3.   Results for Jerry Montgomery, Jerry Montgomery (MRN [id number]) as of 04/04/2018 14:27  Ref. Range 03/09/2015 16:01 04/19/2016 16:43 09/26/2016 08:54 12/22/2016 12:50 04/30/2017 09:54 06/28/2017 10:06 08/09/2017 08:51 04/04/2018 13:22  FVC-Pre Latest Units: L 3.48 3.42 3.53 3.45 3.61 3.65 3.43 3.10  FVC-%Pred-Pre Latest Units: % 64 63 65 64 67 73 68 62   Results for Jerry Montgomery, Jerry Montgomery (MRN [id number]) as of 04/04/2018 14:27  Ref. Range 03/09/2015 16:01 04/19/2016 16:43 09/26/2016 08:54 12/22/2016 12:50 04/30/2017 09:54 06/28/2017 10:06 08/09/2017 08:51 04/04/2018 13:22  DLCO cor Latest Units: ml/min/mmHg  13.75 13.47  13.89 15.08  11.69  DLCO cor % pred Latest Units: %  36 35  36 43  33    Simple office walk 185 feet x  3 laps goal with forehead probe 04/04/2018    O2 used rooom air   Number laps completed Only 2   Comments about pace normal   Resting Pulse Ox/HR 99% and 73/min   Final Pulse Ox/HR 86% and 106/min - 2nd lap  Desaturated </= 88% yes   Desaturated <= 3% points yes   Got Tachycardic >/= 90/min yes   Symptoms at end of test dyspnea   Miscellaneous comments Corrected with 4L [place]        has a past medical history of Acute diastolic heart failure (HCC), Atrial fibrillation (HCC), Atrial tachycardia ([place]), CAD (coronary artery disease), COPD (chronic obstructive pulmonary disease) ([place]), Hyperlipidemia, Hypertension, Hypothyroidism, On home oxygen therapy, Pneumothorax on right (4/11), Postinflammatory pulmonary fibrosis ([place]), Sleep apnea, SVT (supraventricular tachycardia) ([place]), Type II diabetes mellitus ([place]), and WPW (Wolff-Parkinson-White syndrome).   reports that he quit smoking about 10 years ago. His  smoking use included cigarettes. He has a 52.50 pack-year smoking history. He has never used smokeless tobacco.  Past Surgical History:  Procedure Laterality Date  . BILATERAL VATS ABLATION Right   . CARDIAC CATHETERIZATION  05/03/2007   patent stents  . CARDIAC CATHETERIZATION N/A 05/18/2015   Procedure: Right Heart Cath;  Surgeon: Belva Crome, MD;  Location: [facility];  Service: Cardiovascular;  Laterality: N/A;  . CORONARY ANGIOPLASTY WITH STENT PLACEMENT  03/05/2003; 2008; 01/13/2010   PCI & Stent  LAD & mid CX; stent to CX  (I've got 4 stents total" (04/15/2015)  . ELECTROPHYSIOLOGIC STUDY N/A 04/14/2015   Procedure: SVT Ablation;  Surgeon: Evans Lance, MD;  Location: [facility];  Service: Cardiovascular;  Laterality: N/A;  . KNEE ARTHROSCOPY Right 01/22/2013   Procedure: RIGHT ARTHROSCOPY KNEE WITH DEBRIDEMENT, abrasion chondroplasty of lateral tibial plateau, debridement of partial tear of ACL, menisectomy;  Surgeon: Tobi Bastos, MD;  Location: [facility];  Service: Orthopedics;  Laterality: Right;  . KNEE SURGERY  2012   "reattached quad"  . LUNG LOBECTOMY Right   . SHOULDER ARTHROSCOPY W/ ROTATOR CUFF REPAIR Bilateral   . VIDEO ASSISTED THORACOSCOPY Left 07/19/2015   Procedure: LEFT VIDEO ASSISTED THORACOSCOPY WITH LEFT UPPER AND LOWER LUNG BIOPSY;  Surgeon: Melrose Nakayama, MD;  Location: [facility];  Service: Thoracic;  Laterality: Left;  Marland Kitchen VIDEO BRONCHOSCOPY N/A 07/19/2015   Procedure: VIDEO BRONCHOSCOPY WITH FLUORO;  Surgeon: Melrose Nakayama, MD;  Location: [facility];  Service: Thoracic;  Laterality: N/A;    Allergies  Allergen Reactions  . Niaspan [Niacin] Itching  . Ofev [Nintedanib] Other (See Comments)    Lethargic, wiped out  . Pirfenidone   . Altace [Ramipril] Other (See Comments)    Cough     Immunization History  Administered Date(s) Administered  . Influenza Whole 07/23/2012  . Influenza,inj,Quad PF,6+ Mos 07/08/2015, 06/27/2016, 07/27/2017   . Influenza-Unspecified 07/16/2014  . Pneumococcal Conjugate-13 11/10/2013  . Pneumococcal Polysaccharide-23 07/08/2015    Family History  Problem Relation Age of Onset  . Breast cancer Mother   . Diabetes type II Mother   . Lung cancer Father      Current Outpatient Medications:  .  ALPRAZolam (XANAX) 0.5 MG tablet, Take 1 tablet (0.5 mg total) by mouth at bedtime as needed for anxiety., Disp: 30 tablet, Rfl: 0 .  aspirin 81 MG tablet, Take 81 mg by mouth every morning. , Disp: , Rfl:  .  atorvastatin (LIPITOR) 40 MG tablet, Take 1 tablet by mouth every night at bedtime, Disp: 30 tablet, Rfl: 3 .  blood glucose meter kit and supplies KIT, Dispense based on patient and insurance preference. Use up to four times daily as directed. (FOR ICD E11.65), Disp: 1 each, Rfl: 0 .  Blood Glucose Monitoring Suppl (ONE TOUCH ULTRA  2) w/Device KIT, Use to monitor FSBS 4x daily for fluctuating blood sugars. Dx: E11.65., Disp: 1 each, Rfl: 0 .  Blood Glucose Monitoring Suppl (ONE TOUCH ULTRA SYSTEM KIT) w/Device KIT, Use to monitor FSBS 4x daily for fluctuating blood sugars. Dx: E11.65., Disp: 1 each, Rfl: 1 .  clopidogrel (PLAVIX) 75 MG tablet, Take 1 tablet by mouth every day, Disp: 90 tablet, Rfl: 3 .  dapagliflozin propanediol (FARXIGA) 10 MG TABS tablet, Take 10 mg by mouth daily., Disp: 30 tablet, Rfl: 0 .  furosemide (LASIX) 20 MG tablet, Take 1 tablet (20 mg total) by mouth daily as needed for fluid (fluid)., Disp: 90 tablet, Rfl: 3 .  insulin aspart (NOVOLOG) 100 UNIT/ML injection, 15 UNITS WITH BREAKFAST AND 15 UNITS WITH LUNCH, 20 UNITS WITH SUPPER, Disp: 60 mL, Rfl: 5 .  insulin glargine (LANTUS) 100 UNIT/ML injection, Inject 0.6 mLs (60 Units total) into the skin at bedtime., Disp: 60 mL, Rfl: 3 .  isosorbide mononitrate (IMDUR) 60 MG 24 hr tablet, Take 60 mg by mouth every day, Disp: 90 tablet, Rfl: 3 .  levothyroxine (SYNTHROID, LEVOTHROID) 125 MCG tablet, Take 1 tablet (125 mcg total) by  mouth daily., Disp: 90 tablet, Rfl: 3 .  Menthol, Topical Analgesic, (BIOFREEZE EX), Apply 1 application topically as needed (for pain)., Disp: , Rfl:  .  metFORMIN (GLUCOPHAGE) 850 MG tablet, Take 1 tablet by mouth twice a day, Disp: 180 tablet, Rfl: 1 .  metoprolol succinate (TOPROL-XL) 100 MG 24 hr tablet, Take 1 tablet (100 mg total) by mouth daily. Take with or immediately following a meal., Disp: 90 tablet, Rfl: 3 .  metoprolol succinate (TOPROL-XL) 50 MG 24 hr tablet, Take 1 tablet (50 mg total) by mouth daily. Take with or immediately following a meal., Disp: 90 tablet, Rfl: 3 .  omega-3 acid ethyl esters (LOVAZA) 1 g capsule, Take 2 capsules (2 g total) by mouth 2 (two) times daily., Disp: 360 capsule, Rfl: 3 .  ONE TOUCH ULTRA TEST test strip, Use to monitor FSBS 4x daily for fluctuating blood sugars. Dx: E11.65., Disp: 400 each, Rfl: 3 .  ONETOUCH DELICA LANCETS FINE MISC, Test up to 4 times a day as directed., Disp: 100 each, Rfl: 5 .  SYRINGE/NEEDLE, DISP, 1 ML (B-D SYRINGE/NEEDLE 1CC/25GX5/8) 25G X 5/8" 1 ML MISC, As directed, Disp: 100 each, Rfl: 5 .  tiotropium (SPIRIVA) 18 MCG inhalation capsule, Place 18 mcg into inhaler and inhale daily. Reported on 02/25/2016, Disp: , Rfl:  .  zolpidem (AMBIEN) 10 MG tablet, Take 1 tablet by mouth at bedtime., Disp: 90 tablet, Rfl: 0 .  albuterol (PROVENTIL HFA;VENTOLIN HFA) 108 (90 Base) MCG/ACT inhaler, Inhale 2 puffs into the lungs every 6 (six) hours as needed for wheezing. (Patient not taking: Reported on 04/04/2018), Disp: 3 Inhaler, Rfl: 3 .  nitroGLYCERIN (NITROSTAT) 0.4 MG SL tablet, Place 1 tablet (0.4 mg total) under the tongue every 5 (five) minutes as needed for chest pain. (Patient not taking: Reported on 04/04/2018), Disp: 25 tablet, Rfl: 1   Review of Systems     Objective:   Physical Exam  Constitutional: He is oriented to person, place, and time. He appears well-developed and well-nourished. No distress.  HENT:  Head:  Normocephalic and atraumatic.  Right Ear: External ear normal.  Left Ear: External ear normal.  Mouth/Throat: Oropharynx is clear and moist. No oropharyngeal exudate.  o2 on  Eyes: Pupils are equal, round, and reactive to light. Conjunctivae and EOM are normal.   Right eye exhibits no discharge. Left eye exhibits no discharge. No scleral icterus.  Neck: Normal range of motion. Neck supple. No JVD present. No tracheal deviation present. No thyromegaly present.  Cardiovascular: Normal rate, regular rhythm and intact distal pulses. Exam reveals no gallop and no friction rub.  No murmur heard. Pulmonary/Chest: Effort normal. No respiratory distress. He has no wheezes. He has rales. He exhibits no tenderness.  Abdominal: Soft. Bowel sounds are normal. He exhibits no distension and no mass. There is no tenderness. There is no rebound and no guarding.  viscerla obesity  Musculoskeletal: Normal range of motion. He exhibits no edema or tenderness.  Lymphadenopathy:    He has no cervical adenopathy.  Neurological: He is alert and oriented to person, place, and time. He has normal reflexes. No cranial nerve deficit. Coordination normal.  Skin: Skin is warm and dry. No rash noted. He is not diaphoretic. No erythema. No pallor.  Psychiatric: He has a normal mood and affect. His behavior is normal. Judgment and thought content normal.  Nursing note and vitals reviewed.  Vitals:   04/04/18 1404  BP: 130/80  Pulse: 75  SpO2: 94%  Weight: 257 lb (116.6 kg)  Height: 6' (1.829 m)    Estimated body mass index is 34.86 kg/m as calculated from the following:   Height as of this encounter: 6' (1.829 m).   Weight as of this encounter: 257 lb (116.6 kg).     Assessment:       ICD-10-CM   1. IPF (idiopathic pulmonary fibrosis) (HCC) J84.112   2. Chronic respiratory failure with hypoxia (HCC) J96.11         Plan:       Lungs have decline 9.6% oct 2018 -> June 2019 You have not tolerated either  esbriet or ofev in past and you ar on supportive care You screen failed [facility] study entry  PLAN\- - do HRCT supine/prone (last one may 2016) - next few weeks  - continue o2 as before and basic supportive care - research is next best care option - we discussed various protocols and it seems upcoming ZEPHYRUS trial by [employer] is best fit for you from safety, efficacy profile - [facility] researhc will call you sometime next 10 days - 30 days to get you screened for this study  Folllowup  - 3 months ILD clinic for standard of care visit; this visit might be cancelled or changed if you are on research protocl   > 50% of this > 40 min visit spent in face to face counseling or/and coordination of care   Dr. Murali Ramaswamy, M.D., F.C.C.P Pulmonary and Critical Care Medicine Staff Physician, [facility] Center Director - Interstitial Lung Disease  Program  [facility] - [facility] at [facility] [place], [place], [address]  Pager: [phone], If no answer or between  15:00h - 7:00h: call [phone] Telephone: [phone]        

## 2018-04-04 NOTE — Telephone Encounter (Signed)
Forgot to tell yio uto order HRCT supine/prone for next 10 days at his conviencie. Will call with results. Last one was I 2016 - IPF progression

## 2018-04-04 NOTE — Patient Instructions (Addendum)
ICD-10-CM   1. IPF (idiopathic pulmonary fibrosis) (HCC) J84.112      Lungs have decline 9.6% oct 2018 -> June 2019 You have not tolerated either esbriet or ofev in past and you ar on supportive care You screen failed [facility] study entry  PLAN - do HRCT supine/prone (last one may 2016) - next few weeks  - continue o2 as before and basic supportive care  - try to stay behind and fill out IPF-PRO  Research registry qustionnaires - administred by Cam Mosley of [facility] - research is next best care option - we discussed various protocols and it seems upcoming ZEPHYRUS trial by [employer] is best fit for you from safety, efficacy profile - [facility] resear[facility] will call you sometime next 10 days - 30 days to get you screened for this study  Folllowup  - 3 months ILD clinic for standard of care visit; this visit might be cancelled or changed if you are on research protocl

## 2018-04-04 NOTE — Addendum Note (Signed)
Addended by: Wyvonne LenzPINION, [place] Takacs P on: 04/04/2018 03:08 PM   Modules accepted: Orders

## 2018-04-04 NOTE — Progress Notes (Signed)
Spirometry and Dlco done today. 

## 2018-04-04 NOTE — Addendum Note (Signed)
Addended by: Kalman ShanAMASWAMY, Starsky Nanna on: 04/04/2018 03:24 PM   Modules accepted: Level of Service

## 2018-04-05 NOTE — Telephone Encounter (Signed)
Called and spoke with patient, advised him of MR note. Patient is ok with us ordering scan. Scan ordered. Nothing further needed.

## 2018-04-08 ENCOUNTER — Other Ambulatory Visit: Payer: Self-pay | Admitting: Family Medicine

## 2018-04-08 NOTE — Telephone Encounter (Signed)
Please send to pharm b/c of  the Ambien  LOV - 02/07/18  LRF -  01/08/18

## 2018-04-08 NOTE — Telephone Encounter (Signed)
Ok to refill Ambien??  Last office visit 02/07/2018.  Last refill 01/08/2018.

## 2018-04-10 ENCOUNTER — Other Ambulatory Visit: Payer: Self-pay | Admitting: *Deleted

## 2018-04-10 NOTE — Progress Notes (Signed)
Clinical Research Coordinator / Research RN note : This visit for Subject Jerry NewtonRobert S Golomb with DOB: 10/16/1956 on 04/10/2018 for the above protocol is Visit/Encounter 24 month follow up  and is for purpose of Research . The consent for this encounter is under Protocol Version date November 23, 2015 and IS currently IRB approved. The Subject expressed continued interest and consent in continuing as a study subject. Subject confirmed that there was  no change in contact information (e.g. address, telephone, email). Subject thanked for participation in research and contribution to science.   In this visit 04/10/2018 the subject returned to the [facility] office to have follow up blood sample collection and Patient reported outcomes questionnaires completed. The Subject completed all study related procedures as per above stated protocol. The subject was thanked for his participation and was informed that he would be contacted at a later date to scheduled his next follow up visit in approximately 6 months.    Signed by  [name] BS, [facility]  Clinical Research Coordinator [facility]  [place], [place] 16:1013:01 04/10/2018

## 2018-04-16 ENCOUNTER — Telehealth: Payer: Self-pay | Admitting: Internal Medicine

## 2018-04-16 ENCOUNTER — Ambulatory Visit (INDEPENDENT_AMBULATORY_CARE_PROVIDER_SITE_OTHER)
Admission: RE | Admit: 2018-04-16 | Discharge: 2018-04-16 | Disposition: A | Discharge status: Home / Self Care | Payer: PPO | Source: Ambulatory Visit | Attending: Internal Medicine | Admitting: Internal Medicine

## 2018-04-16 DIAGNOSIS — J849 Interstitial pulmonary disease, unspecified: Secondary | ICD-10-CM | POA: Diagnosis not present

## 2018-04-16 DIAGNOSIS — J439 Emphysema, unspecified: Secondary | ICD-10-CM | POA: Diagnosis not present

## 2018-04-16 NOTE — Telephone Encounter (Signed)
resultso mild progression given He has No honeycombing He has 20% reticuation Emphysema is much less than ILD  He might qualify for ZEPHYRUS - [employer] Phase 3 study of IV MAb Q3 weeks   Plan - once IRB approved will email him ICF - then get him in on Friday 04/19/18 for screening visit -he is agreeable   Dr. Kalman ShanMurali Katharin Schneider, M.D., [facility] HospitalF.C.C.P Pulmonary and Critical Care Medicine Staff Physician, [facility] Center Director - Interstitial Lung Disease  Program  [facility] [place], [place], [id number]  Pager: [phone], If no answer or between  15:00h - 7:00h: call [phone] Telephone: [phone]       Ct Chest High Resolution  Result Date: 04/16/2018 CLINICAL DATA:  62 year old male with history of abnormal pulmonary function tests. Evaluate for pulmonary fibrosis. EXAM: CT CHEST WITHOUT CONTRAST TECHNIQUE: Multidetector CT imaging of the chest was performed following the standard protocol without intravenous contrast. High resolution imaging of the lungs, as well as inspiratory and expiratory imaging, was performed. COMPARISON:  Chest CT 03/18/2015. FINDINGS: Cardiovascular: Heart size is mildly enlarged. There is no significant pericardial fluid, thickening or pericardial calcification. There is aortic atherosclerosis, as well as atherosclerosis of the great vessels of the mediastinum and the coronary arteries, including calcified atherosclerotic plaque in the left main, left anterior descending, left circumflex and right coronary arteries. Mild calcifications of the aortic valve. Dilatation of the pulmonic trunk (4.2 cm in diameter), increased from prior examination. Mediastinum/Nodes: Multiple prominent borderline enlarged mediastinal and hilar lymph nodes, however, most of these are densely calcified, presumably benign. Small hiatal hernia. No axillary lymphadenopathy. Lungs/Pleura: Diffuse bronchial wall  thickening with mild to moderate centrilobular and paraseptal emphysema. High-resolution images again demonstrate progressively worsening areas of peribronchovascular ground-glass attenuation and frank consolidative changes, with extensive septal thickening (approximately 20% of the lung volume demonstrates reticulation) and regional areas of architectural distortion, which are scattered randomly throughout the lungs bilaterally, but remain asymmetrically distributed involving the right lung to a much greater extent than the left. In the areas of greatest involvement, there is some mild peripheral bronchiolectasis. No traction bronchiectasis. No honeycombing is confidently identified. Inspiratory and expiratory imaging demonstrates moderate air trapping, indicative of small airways disease. No pleural effusions. No suspicious appearing pulmonary nodules or masses. Suture line in the inferior aspect of the left lower lobe, likely from prior open lung biopsy. Upper Abdomen: Aortic atherosclerosis. Musculoskeletal: There are no aggressive appearing lytic or blastic lesions noted in the visualized portions of the skeleton. IMPRESSION: 1. Slight progressive changes of interstitial lung disease, as detailed above. CT pattern is considered indeterminate for usual interstitial pneumonia (UIP). Given the lack of honeycombing, lack of traction bronchiectasis, and presence of extensive air trapping, usual interstitial pneumonia is not favored (despite the prior pathologic findings). Primary differential considerations are fibrotic phase nonspecific interstitial pneumonia (NSIP), or unusual presentation of alternative etiology such as sarcoidosis. 2. In addition, there is diffuse bronchial wall thickening with mild to moderate centrilobular and paraseptal emphysema; imaging findings suggestive of underlying COPD. 3. Dilatation of the pulmonic trunk (4.2 cm in diameter) which has increased compared to the prior study, and  suggests underlying pulmonary arterial hypertension. 4. Aortic atherosclerosis, in addition to left main and 3 vessel coronary artery disease. Please note that although the presence of coronary artery calcium documents the presence of coronary artery disease, the severity of this disease and any potential stenosis cannot be  assessed on this non-gated CT examination. Assessment for potential risk factor modification, dietary therapy or pharmacologic therapy may be warranted, if clinically indicated. 5. Mild cardiomegaly. 6. There are calcifications of the aortic valve. Echocardiographic correlation for evaluation of potential valvular dysfunction may be warranted if clinically indicated. Aortic Atherosclerosis (ICD10-I70.0) and Emphysema (ICD10-J43.9). Electronically Signed   By: Trudie Reed M.D.   On: 04/16/2018 13:43

## 2018-04-19 ENCOUNTER — Encounter: Payer: PPO | Admitting: Internal Medicine

## 2018-05-01 ENCOUNTER — Encounter (INDEPENDENT_AMBULATORY_CARE_PROVIDER_SITE_OTHER): Payer: PPO | Admitting: Pulmonary Disease

## 2018-05-01 ENCOUNTER — Ambulatory Visit (INDEPENDENT_AMBULATORY_CARE_PROVIDER_SITE_OTHER): Payer: PPO | Admitting: Pulmonary Disease

## 2018-05-01 DIAGNOSIS — J84112 Idiopathic pulmonary fibrosis: Secondary | ICD-10-CM

## 2018-05-01 DIAGNOSIS — Z006 Encounter for examination for normal comparison and control in clinical research program: Secondary | ICD-10-CM

## 2018-05-01 LAB — PULMONARY FUNCTION TEST
DL/VA % pred: 56 %
DL/VA: 2.69 ml/min/mmHg/L
DLCO COR: 12.27 ml/min/mmHg
DLCO UNC % PRED: 35 %
DLCO UNC: 12.31 ml/min/mmHg
DLCO cor % pred: 35 %

## 2018-05-01 NOTE — Progress Notes (Signed)
This visit for Subject Jerry NewtonRobert S Montgomery with DOB: 08/10/1956 on 05/01/2018 for the above protocol is Visit/Encounter #  and is for purpose of consenting for study . Subject/LAR expressed continued interest and consent in continuing as a study subject. Subject thanked for participation in research and contribution to science.     Examination-see source document.   Assessment Research visit IPF Discussed with PI Dr. [name] prior to visit.  Discussed with the patient today and he has agreed to participate in the study.  ICF  copy given more than a week ago by Dr. [name] directly to the patient.  Patient has read the document and understands study. Chart reviewed.  Patient has failed both Esbriet and Ofev due to intolerance, side effects.  Plan Per protocol  Chilton GreathousePraveen Olney Monier MD [facility] 05/01/2018, 11:35 AM   Title: FGCL-3019-091 (FibroGen Study) is a Phase 3, randomized, double-blind, placebo-controlled multicenter international study to evaluate evaluate the efficacy and safety of 30 mg/kg IV infusions of pamrevlumab administered every 3 weeks for 52 weeks as compared to placebo in subjects with Idiopathic Pulmonary Fibrosis. Primary end point is: change in FVC from baseline at week 52  Protocol #: FGCL-3019-091, Clinical Trials #: [id number] Sponsor: [email or web address]  [facility]([place]  Protocol Version 1.0 for 05/01/2018  Sponsor Consent Version 1.1 for 05/01/2018 (local version 1.0) Investigator Brochure Version 17 for 05/01/2018   Key Features of Pamrevlumab (FG-3019) the study drug: a recombinant fully human IgG kappa monoclonal antibody that binds to CTGF and is being developed for treatment of diseases in which tissue fibrosis has a major pathogenic role. In particular, pamrevlumab appears to disrupt a CTGF autocrine loop in mesenchymal cells like myofibroblasts that reduces their recruitment of leukocytes like macrophages, mast and dendritic cells  via chemokine secretion. This disruption results in collapse of the cellular crosstalk that drives tissue remodeling.  Key Inclusion Criteria:  Age 64 to 2785 years Diagnosis of IPF within the past 5 years  Interstitial pulmonary fibrosis defined by HRCT scan at Screening, with evidence of ?10% to <50% parenchymal fibrosis and <25% honeycombing, within the whole lung. Not currently receiving treatment for IPF with approved therapy. a. FVC value ?50% and ?90% of predicted at screening b. DLCO percent of predicted and corrected by Hb value ?30% and ?90% at screening The extent of fibrosis is greater than the extent of emphysema on HRCT. Male subjects with partners of childbearing potential and male subjects of childbearing potential (including those <1 year postmenopausal) must use double barrier contraception methods during conduct of study, and for 3 months after last dose of study drug.   Key Exclusion Criteria  Ongoing acute IPF exacerbation, or suspicion of such process, during Screening or Randomization Interstitial lung disease other than IPF; Poorly controlled chronic heart failure; clinical diagnosis of cor pulmonale requiring specific treatment; or severe pulmonary arterial hypertension Smoking within 3 months of Screening and/or unwilling to avoid smoking throughout the study. Use of any investigational drugs, for IPF or not, in the 30 days prior to screening initiation. Or use of approved IPF therapies within 5 half-lives of screening. High likelihood of lung transplantation within 6 months after Day 1. Any history of malignancy likely to result in significant disability or mortality likely to require significant medical or surgical intervention within the next 2 years. This does not include minor surgical procedures for localized cancer. Previous exposure to pamrevlumab. Daily use of PDE-5 inhibitor drugs [e.g. sildenafil, tadalafil, other]. (Note: Intermittent use of  one type for  erectile dysfunction or severe pulmonary hypertension is allowed).    Mechanism of Action: Pamrevlumab is a human recombinant IgG monoclonal antibody that binds to connective tissue growth factor (CTGF).  CTGF plays a role by mediating the process of fibrosis. By binding to CTGF, pamrevlumab blocks its biologic activity; thereby preventing cell proliferation, adhesion and migration of growth factors involved in fibrotic changes. It is also being studied in other conditions where fibrotic changes play a role; liver fibrosis, Duchenne muscular dystrophy and certain cancers.   Half-life: 58-141 hours; t1/2 increases with increasing doses   Interactions No known drug interactions. All concomitant medications to be reviewed by PI.     Safety Data Amendment 1.0. 06 January 2018 624 subjects have been exposed to pamrevlumab, 270 with IPF The most common TEAEs in all subjects with IPF: Cough, fatigue, dyspnea, upper respiratory tract infection, bronchitis, nasopharyngitis No known effect on qtc prolongation, renal or hepatic issues   Phase 1 study Study FGCL-MC3019-002, n=21 Enrolled 21 subjects with IPF No dose-limiting toxicities All adverse events were considered mild to moderate 76% of subjects experienced at least 1 TEAE The most common TEAEs: Pyrexia (n=3, 14% of subjects) Cough (n=3, 14% of subjects) Dyspnea (n=3, 14% of subjects) Respiratory tract infection (n=2, 9% of subjects)   Phase 2 study Study FGCL-3019-049, n=90 Enrolled 90 subjects with IPF 14 deaths occurred, 13 were deemed to be related to IPF 20% of subjects experienced a TEAE that led to study drug discontinuation IPF and respiratory failure were the two most common reasons for discontinuation; occurring in 8% and 3% of patients, respectively The most common TEAEs: Cough (n=34, 38% of subjects) Dyspnea (n=24, 27% of subjects) Fatigue (n=24, 27% of subjects) Nasopharyngitis (n=20, 22% of subjects) Respiratory  tract infection (n=19, 21% of subjects) Bronchitis (n=18, 20% of subjects)   Phase 2 study Study FGCL-3019-067, n=103 103 subjects enrolled with IPF 9 deaths occurred; 4 deemed related to IPF, 5 related to other respiratory causes 18% of subjects experienced a TEAE that led to study drug discontinuation The most common TEAEs: Cough (n=48, 47% of subjects) Respiratory tract infection (n=39, 38% of subjects) IPF (n=32, 31% of subjects) Dysnpea (n=30, 29% of subjects) Sinusitis (n=21, 21% of subjects) Fatigue (n=20, 19% of subjects)   Overall, pamrevlumab has been well tolerated. Infusion-related reactions have been reported at a rate that is consistent with other human monoclonal antibodies. Based on the mechanism of action of pamrevlumab, by inhibiting CTGF, there was some concern that this would cause impaired wound healing or impaired bone fracture healing. However, there were no serious adverse events reported in any study relating to these two issues.    ................................. PI OVERSIGHT ATTESTATION  I the Principal Investigator (PI) for the above mentioned study attest that I reviewed the above mentioned  sub-investigator  notes on research subject  Jerry Montgomery  born 26-Oct-1955 . I  agree with the findings mentioned above. Of note, He had trial of Pirfenidone (esbriet) at [facility] in early 2015 for 3  Months. But had lot of fatigue and stopped it. 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 He started Nintedanib Durel Salts)  Oct 2016 approx and  stopped taking ofev in dec 2016 due to making him "feel bad and fatigue". He did confirm to me over clinic visits and prior to this study entry that he is NOT interested in re-challenging himself with either esbriet or ofev.    Dr. Kalman Montgomery, M.D., F.C.C.P., ACRP-CPI Pulmonary and  Critical Care Medicine Principal Investigator & Staff Physician [facility]  [facility] Pager: [phone], If no answer or between  15:00h - 7:00h: call [phone]  05/09/2018 5:58 PM

## 2018-05-01 NOTE — Progress Notes (Signed)
Research DLCO completed 

## 2018-05-09 DIAGNOSIS — J438 Other emphysema: Secondary | ICD-10-CM | POA: Diagnosis not present

## 2018-05-09 DIAGNOSIS — J8410 Pulmonary fibrosis, unspecified: Secondary | ICD-10-CM | POA: Diagnosis not present

## 2018-05-09 DIAGNOSIS — R269 Unspecified abnormalities of gait and mobility: Secondary | ICD-10-CM | POA: Diagnosis not present

## 2018-05-09 DIAGNOSIS — J449 Chronic obstructive pulmonary disease, unspecified: Secondary | ICD-10-CM | POA: Diagnosis not present

## 2018-05-09 NOTE — Progress Notes (Signed)
.Title: FGCL-3019-091 (FibroGen Study) is a Phase 3, randomized, double-blind, placebo-controlled multicenter international study to evaluate evaluate the efficacy and safety of 30 mg/kg IV infusions of pamrevlumab administered every 3 weeks for 52 weeks as compared to placebo in subjects with Idiopathic Pulmonary Fibrosis. Primary end point is: change in FVC from baseline at week 52  Protocol #: FGCL-3019-091, Clinical Trials #: [id number] Sponsor: [email or web address]  [facility] [place], [place], [place])  Protocol Version for 05/01/2018 is Amendment 1  (double checked) Consent Version for 05/01/2018 is Version 1.1   (double checked)  Key Features of Pamrevlumab (FG-3019) the study drug: a recombinant fully human IgG kappa monoclonal antibody that binds to CTGF and is being developed for treatment of diseases in which tissue fibrosis has a major pathogenic role. In particular, pamrevlumab appears to disrupt a CTGF autocrine loop in mesenchymal cells like myofibroblasts that reduces their recruitment of leukocytes like macrophages, mast and dendritic cells via chemokine secretion. This disruption results in collapse of the cellular crosstalk that drives tissue remodeling.  Key Inclusion Criteria:  Age 42 to 89 years Diagnosis of IPF within the past 5 years  Interstitial pulmonary fibrosis defined by HRCT scan at Screening, with evidence of ?10% to <50% parenchymal fibrosis and <25% honeycombing, within the whole lung. Not currently receiving treatment for IPF with approved therapy. a. FVC value ?50% and ?90% of predicted at screening b. DLCO percent of predicted and corrected by Hb value ?30% and ?90% at screening The extent of fibrosis is greater than the extent of emphysema on HRCT. Male subjects with partners of childbearing potential and male subjects of childbearing potential (including those <1 year postmenopausal) must use double barrier contraception methods during conduct of study, and for 3 months  after last dose of study drug.   Key Exclusion Criteria  Ongoing acute IPF exacerbation, or suspicion of such process, during Screening or Randomization Interstitial lung disease other than IPF; Poorly controlled chronic heart failure; clinical diagnosis of cor pulmonale requiring specific treatment; or severe pulmonary arterial hypertension Smoking within 3 months of Screening and/or unwilling to avoid smoking throughout the study. Use of any investigational drugs, for IPF or not, in the 30 days prior to screening initiation. Or use of approved IPF therapies within 5 half-lives of screening. High likelihood of lung transplantation within 6 months after Day 1. Any history of malignancy likely to result in significant disability or mortality likely to require significant medical or surgical intervention within the next 2 years. This does not include minor surgical procedures for localized cancer. Previous exposure to pamrevlumab. Daily use of PDE-5 inhibitor drugs [e.g. sildenafil, tadalafil, other]. (Note: Intermittent use of one type for erectile dysfunction or severe pulmonary hypertension is allowed).    Mechanism of Action: Pamrevlumab is a human recombinant IgG monoclonal antibody that binds to connective tissue growth factor (CTGF).  CTGF plays a role by mediating the process of fibrosis. By binding to CTGF, pamrevlumab blocks its biologic activity; thereby preventing cell proliferation, adhesion and migration of growth factors involved in fibrotic changes. It is also being studied in other conditions where fibrotic changes play a role; liver fibrosis, Duchenne muscular dystrophy and certain cancers.   Half-life: 58-141 hours; t1/2 increases with increasing doses   Interactions No known drug interactions. All concomitant medications to be reviewed by PI.     Safety Data Amendment 1.0. 06 January 2018 624 subjects have been exposed to pamrevlumab, 270 with IPF The most common TEAEs  in all subjects with IPF: Cough, fatigue,  dyspnea, upper respiratory tract infection, bronchitis, nasopharyngitis No known effect on qtc prolongation, renal or hepatic issues   Phase 1 study Study FGCL-MC3019-002, n=21 Enrolled 21 subjects with IPF No dose-limiting toxicities All adverse events were considered mild to moderate 76% of subjects experienced at least 1 TEAE The most common TEAEs: Pyrexia (n=3, 14% of subjects) Cough (n=3, 14% of subjects) Dyspnea (n=3, 14% of subjects) Respiratory tract infection (n=2, 9% of subjects)   Phase 2 study Study FGCL-3019-049, n=90 Enrolled 90 subjects with IPF 14 deaths occurred, 13 were deemed to be related to IPF 20% of subjects experienced a TEAE that led to study drug discontinuation IPF and respiratory failure were the two most common reasons for discontinuation; occurring in 8% and 3% of patients, respectively The most common TEAEs: Cough (n=34, 38% of subjects) Dyspnea (n=24, 27% of subjects) Fatigue (n=24, 27% of subjects) Nasopharyngitis (n=20, 22% of subjects) Respiratory tract infection (n=19, 21% of subjects) Bronchitis (n=18, 20% of subjects)   Phase 2 study Study FGCL-3019-067, n=103 103 subjects enrolled with IPF 9 deaths occurred; 4 deemed related to IPF, 5 related to other respiratory causes 18% of subjects experienced a TEAE that led to study drug discontinuation The most common TEAEs: Cough (n=48, 47% of subjects) Respiratory tract infection (n=39, 38% of subjects) IPF (n=32, 31% of subjects) Dysnpea (n=30, 29% of subjects) Sinusitis (n=21, 21% of subjects) Fatigue (n=20, 19% of subjects)   Overall, pamrevlumab has been well tolerated. Infusion-related reactions have been reported at a rate that is consistent with other human monoclonal antibodies. Based on the mechanism of action of pamrevlumab, by inhibiting CTGF, there was some concern that this would cause impaired wound healing or impaired bone fracture healing.  However, there were no serious adverse events reported in any study relating to these two issues.    LATE ENTRY: Clinical Designer, industrial/productesearch Coordinator / Research RN note : This visit for Subject Jerry NewtonRobert S Montgomery with DOB: 08/03/1956 on 05/01/2018 for the above protocol is Visit/Encounter # Screening  and is for purpose of research . The consent for this encounter is under Protocol Version Amendment 1 and is currently IRB approved. Subject expressed continued interest and consent in continuing as a study subject. Subject confirmed that there was no change in contact information (e.g. address, telephone, email). Subject thanked for participation in research and contribution to science.   In this visit 05/09/2018 the subject will be evaluated by investigator named Chilton GreathousePraveen Mannam, MD, Sub-I  . This research coordinator has verified that the investigator is uptodate with his/her training logs  Because the PI is NOT available due to vacation, the sub-I reported and CRC has confirmed that the PI has discussed the visit a-priori with the sub-investigator.   The subject consented to participate in this study on 05/01/2018. Refer to the informed consent documentation checklist for further details.  All procedures were completed according to the above mentioned protocol.   Signed by Carron CurieJennifer Castillo, CMA, [id number] Clinical Research Coordinator  [facility]  [place], [place] 8:47 AM 05/09/2018

## 2018-05-13 ENCOUNTER — Other Ambulatory Visit: Payer: Self-pay | Admitting: Internal Medicine

## 2018-05-13 DIAGNOSIS — J84112 Idiopathic pulmonary fibrosis: Secondary | ICD-10-CM

## 2018-05-13 DIAGNOSIS — Z006 Encounter for examination for normal comparison and control in clinical research program: Secondary | ICD-10-CM

## 2018-05-17 ENCOUNTER — Ambulatory Visit (INDEPENDENT_AMBULATORY_CARE_PROVIDER_SITE_OTHER)
Admission: RE | Admit: 2018-05-17 | Discharge: 2018-05-17 | Disposition: A | Discharge status: Home / Self Care | Payer: Self-pay | Source: Ambulatory Visit | Attending: Internal Medicine | Admitting: Internal Medicine

## 2018-05-17 DIAGNOSIS — Z006 Encounter for examination for normal comparison and control in clinical research program: Secondary | ICD-10-CM

## 2018-05-17 DIAGNOSIS — J84112 Idiopathic pulmonary fibrosis: Secondary | ICD-10-CM

## 2018-05-23 ENCOUNTER — Telehealth: Payer: Self-pay | Admitting: Internal Medicine

## 2018-05-23 NOTE — Telephone Encounter (Signed)
Charlotta NewtonRobert S Dragoo 05/25/1956 [facility] and since 2016 Sept UIP/IPF dx - restrictive pattern with steady decline     [facility]  "The patient was noted to have a total lung capacity of 72% of predicted with a 5.5 liters in the setting of a reduced FEV1 of 69% of predicted at 2.78 liters, FVC of 70.8% of predicted 3.75 liters, providing a ratio of 97.3. The patient was noted also to have a reduced diffusion of 60.2% at 16.56."      At our health system  Results for Charlotta NewtonHIPPS, Nolon S (MRN [id number]) as of 05/23/2018 16:37  Ref. Range 03/09/2015 16:01 04/19/2016 16:43 09/26/2016 08:54 12/22/2016 12:50 04/30/2017 09:54 06/28/2017 10:06 08/09/2017 08:51 04/04/2018 13:22  FVC-Pre Latest Units: L 3.48 3.42 3.53 3.45 3.61 3.65 3.43 3.10  FVC-%Pred-Pre Latest Units: % 64 63 65 64 67 73 68 62   Results for Charlotta NewtonHIPPS, Izreal S (MRN [id number]) as of 05/23/2018 16:37  Ref. Range 03/09/2015 16:01 04/19/2016 16:43 09/26/2016 08:54 12/22/2016 12:50 04/30/2017 09:54 06/28/2017 10:06 08/09/2017 08:51 04/04/2018 13:22  Pre FEV1/FVC ratio Latest Units: % 72 75 74 74 75 77 75 74     Results for Charlotta NewtonHIPPS, Kasem S (MRN [id number]) as of 05/23/2018 16:37  Ref. Range 03/09/2015 16:01 04/19/2016 16:43 09/26/2016 08:54 12/22/2016 12:50 04/30/2017 09:54 06/28/2017 10:06 08/09/2017 08:51 04/04/2018 13:22 05/01/2018 12:02  DLCO unc Latest Units: ml/min/mmHg 15.02 14.05 13.97 14.52 14.38 15.53 15.77 11.92 12.31  DLCO unc % pred Latest Units: % 39 37 37 38 38 44 45 34 35      Dr. Kalman ShanMurali Malaka Ruffner, M.D., F.C.C.P Pulmonary and Critical Care Medicine Staff Physician, [facility] Center Director - Interstitial Lung Disease  Program  Pulmonary Fibrosis [facility] [place], [place], [id number]  Pager: [phone], If no answer or between  15:00h - 7:00h: call [phone] Telephone: [phone]

## 2018-06-07 ENCOUNTER — Other Ambulatory Visit: Payer: Self-pay | Admitting: Family Medicine

## 2018-06-07 ENCOUNTER — Encounter: Payer: PPO | Admitting: *Deleted

## 2018-06-07 ENCOUNTER — Ambulatory Visit (HOSPITAL_COMMUNITY)
Admission: RE | Admit: 2018-06-07 | Discharge: 2018-06-07 | Disposition: A | Discharge status: Home / Self Care | Payer: PPO | Source: Ambulatory Visit | Attending: Internal Medicine | Admitting: Internal Medicine

## 2018-06-07 DIAGNOSIS — J84112 Idiopathic pulmonary fibrosis: Secondary | ICD-10-CM

## 2018-06-07 DIAGNOSIS — Z006 Encounter for examination for normal comparison and control in clinical research program: Secondary | ICD-10-CM

## 2018-06-07 MED ORDER — STUDY - FIBROGEN - PAMREVLUMAB OR PLACEBO 10 MG/ML IV INFUSION (PI-RAMASWAMY)
30.0000 mg/kg | Freq: Once | INTRAVENOUS | Status: AC
  Administered 2018-06-07: 3420 mg via INTRAVENOUS
  Filled 2018-06-07: qty 350

## 2018-06-07 NOTE — Progress Notes (Signed)
Title: FGCL-3019-091 (FibroGen Study) is a Phase 3, randomized, double-blind, placebo-controlled multicenter international study to evaluate evaluate the efficacy and safety of 30 mg/kg IV infusions of pamrevlumab administered every 3 weeks for 52 weeks as compared to placebo in subjects with Idiopathic Pulmonary Fibrosis. Primary end point is: change in FVC from baseline at week 52  Protocol #: FGCL-3019-091, Clinical Trials #: [id number] Sponsor: [email or web address]  [facility]([place]  Protocol Version for 06/07/2018 Amendment 1.0 Consent Version for 06/07/2018 is Central IRB Version 2.1  Key Features of Pamrevlumab (FG-3019) the study drug: a recombinant fully human IgG kappa monoclonal antibody that binds to CTGF and is being developed for treatment of diseases in which tissue fibrosis has a major pathogenic role. In particular, pamrevlumab appears to disrupt a CTGF autocrine loop in mesenchymal cells like myofibroblasts that reduces their recruitment of leukocytes like macrophages, mast and dendritic cells via chemokine secretion. This disruption results in collapse of the cellular crosstalk that drives tissue remodeling.  Key Inclusion Criteria: Age 64 to 7085 years Diagnosis of IPF within the past 5 years  Interstitial pulmonary fibrosis defined by HRCT scan at Screening, with evidence of ?10% to <50% parenchymal fibrosis and <25% honeycombing, within the whole lung. Not currently receiving treatment for IPF with approved therapy. a. FVC value ?50% and ?90% of predicted at screening b. DLCO percent of predicted and corrected by Hb value ?30% and ?90% at screening The extent of fibrosis is greater than the extent of emphysema on HRCT. Male subjects with partners of childbearing potential and male subjects of childbearing potential (including those <1 year postmenopausal) must use double barrier contraception methods during conduct of study, and for 3 months after last dose of study  drug.  Key Exclusion Criteria Ongoing acute IPF exacerbation, or suspicion of such process, during Screening or Randomization Interstitial lung disease other than IPF; Poorly controlled chronic heart failure; clinical diagnosis of cor pulmonale requiring specific treatment; or severe pulmonary arterial hypertension Smoking within 3 months of Screening and/or unwilling to avoid smoking throughout the study. Use of any investigational drugs, for IPF or not, in the 30 days prior to screening initiation. Or use of approved IPF therapies within 5 half-lives of screening. High likelihood of lung transplantation within 6 months after Day 1. Any history of malignancy likely to result in significant disability or mortality likely to require significant medical or surgical intervention within the next 2 years. This does not include minor surgical procedures for localized cancer. Previous exposure to pamrevlumab. Daily use of PDE-5 inhibitor drugs [e.g. sildenafil, tadalafil, other]. (Note: Intermittent use of one type for erectile dysfunction or severe pulmonary hypertension is allowed).    Mechanism of Action: Pamrevlumab is a human recombinant IgG monoclonal antibody that binds to connective tissue growth factor (CTGF).  CTGF plays a role by mediating the process of fibrosis. By binding to CTGF, pamrevlumab blocks its biologic activity; thereby preventing cell proliferation, adhesion and migration of growth factors involved in fibrotic changes. It is also being studied in other conditions where fibrotic changes play a role; liver fibrosis, Duchenne muscular dystrophy and certain cancers.   Half-life: 58-141 hours; t1/2 increases with increasing doses   Interactions No known drug interactions. All concomitant medications to be reviewed by PI.   Safety Data Amendment 1.0. 06 January 2018 624 subjects have been exposed to pamrevlumab, 270 with IPF The most common TEAEs in all subjects with IPF: Cough,  fatigue, dyspnea, upper respiratory tract infection, bronchitis, nasopharyngitis No known effect on  qtc prolongation, renal or hepatic issues   Phase 1 study Study [id number], n=21 Enrolled 21 subjects with IPF No dose-limiting toxicities All adverse events were considered mild to moderate 76% of subjects experienced at least 1 TEAE The most common TEAEs: Pyrexia (n=3, 14% of subjects) Cough (n=3, 14% of subjects) Dyspnea (n=3, 14% of subjects) Respiratory tract infection (n=2, 9% of subjects)   Phase 2 study Study [id number], n=90 Enrolled 90 subjects with IPF 14 deaths occurred, 13 were deemed to be related to IPF 20% of subjects experienced a TEAE that led to study drug discontinuation IPF and respiratory failure were the two most common reasons for discontinuation; occurring in 8% and 3% of patients, respectively The most common TEAEs: Cough (n=34, 38% of subjects) Dyspnea (n=24, 27% of subjects) Fatigue (n=24, 27% of subjects) Nasopharyngitis (n=20, 22% of subjects) Respiratory tract infection (n=19, 21% of subjects) Bronchitis (n=18, 20% of subjects)   Phase 2 study Study [id number]-3019-067, n=103 103 subjects enrolled with IPF 9 deaths occurred; 4 deemed related to IPF, 5 related to other respiratory causes 18% of subjects experienced a TEAE that led to study drug discontinuation The most common TEAEs: Cough (n=48, 47% of subjects) Respiratory tract infection (n=39, 38% of subjects) IPF (n=32, 31% of subjects) Dysnpea (n=30, 29% of subjects) Sinusitis (n=21, 21% of subjects) Fatigue (n=20, 19% of subjects)   Overall, pamrevlumab has been well tolerated. Infusion-related reactions have been reported at a rate that is consistent with other human monoclonal antibodies. Based on the mechanism of action of pamrevlumab, by inhibiting CTGF, there was some concern that this would cause impaired wound healing or impaired bone fracture healing. However, there were no serious  adverse events reported in any study relating to these two issues.     Clinical Research Coordinator / Research RN note : This visit for Subject Jerry NewtonRobert S Montgomery with DOB: 08/03/1956 on 06/07/2018 for the above protocol is Visit/Encounter # Day 1 Randomization  and is for purpose of research . The consent for this encounter is under Protocol Version Amendment 1.0 and is currently IRB approved. Subject expressed continued interest and consent in continuing as a study subject. Subject confirmed that there was no change in contact information (e.g. address, telephone, email). Subject thanked for participation in research and contribution to science.   Subject was re-consented on Version 2.1 of ICF prior to any study procedures being conducted. Refer to the informed consent process checklist filed in the subjects paper source binder.   Procedures completed per the above protocol requirements. Refer to the subjects paper source binder for documentation of exact time points of procedure completion.   PI, Dr. Marchelle Montgomery determined the subject meets all inclusion/exclusion criteria and is approved to randomize.  IV was placed by Jerry CandyKristen Reis, RN on the right wrist at [phone].  Central labs were collected at 0910.  Subject randomized and infusion was started at 10:17am. Dr. Marchelle Montgomery, PI was present at start of the infusion, and for the first 30 minutes following the start of the infusion. Subject continued to be monitored throughout visit. Infusion ended at 12:15. Confirmed that all study drug was given, PI confirmed this as well.   Subject remained on site until 13:15. IV was removed at 13:15.  Subject denies any complaints at anytime during the visit.      Signed by Jerry Montgomery, CMA, [facility] Clinical Research Coordinator [facility]  [place], [place] 10:58 AM 06/07/2018

## 2018-06-09 DIAGNOSIS — J438 Other emphysema: Secondary | ICD-10-CM | POA: Diagnosis not present

## 2018-06-09 DIAGNOSIS — J449 Chronic obstructive pulmonary disease, unspecified: Secondary | ICD-10-CM | POA: Diagnosis not present

## 2018-06-09 DIAGNOSIS — J8410 Pulmonary fibrosis, unspecified: Secondary | ICD-10-CM | POA: Diagnosis not present

## 2018-06-09 DIAGNOSIS — R269 Unspecified abnormalities of gait and mobility: Secondary | ICD-10-CM | POA: Diagnosis not present

## 2018-06-25 ENCOUNTER — Other Ambulatory Visit (HOSPITAL_COMMUNITY): Payer: Self-pay | Admitting: *Deleted

## 2018-06-26 ENCOUNTER — Ambulatory Visit (HOSPITAL_COMMUNITY)
Admission: RE | Admit: 2018-06-26 | Discharge: 2018-06-26 | Disposition: A | Discharge status: Home / Self Care | Payer: PPO | Source: Ambulatory Visit | Attending: Internal Medicine | Admitting: Internal Medicine

## 2018-06-26 DIAGNOSIS — J84112 Idiopathic pulmonary fibrosis: Secondary | ICD-10-CM | POA: Insufficient documentation

## 2018-06-26 DIAGNOSIS — Z006 Encounter for examination for normal comparison and control in clinical research program: Secondary | ICD-10-CM | POA: Diagnosis not present

## 2018-06-26 MED ORDER — STUDY - FIBROGEN - PAMREVLUMAB OR PLACEBO 10 MG/ML IV INFUSION (PI-RAMASWAMY)
30.0000 mg/kg | Freq: Once | INTRAVENOUS | Status: AC
  Administered 2018-06-26: 3420 mg via INTRAVENOUS
  Filled 2018-06-26: qty 350

## 2018-06-26 NOTE — Progress Notes (Signed)
Title: FGCL-3019-091 (FibroGen Study) is a Phase 3, randomized, double-blind, placebo-controlled multicenter international study to evaluate evaluate the efficacy and safety of 30 mg/kg IV infusions of pamrevlumab administered every 3 weeks for 52 weeks as compared to placebo in subjects with Idiopathic Pulmonary Fibrosis. Primary end point is: change in FVC from baseline at week 52  Protocol #: FGCL-3019-091, Clinical Trials #: [id number] Sponsor: [email or web address]  [facility] [place], [place], [place])  Protocol Version for 06/26/2018 date of  17mar2019 amend 1.0  (double checked yes) Consent Version for 06/26/2018 date of 12AUG2019 Version 2.1, 03MAY2019 local site version 1.0     (double checked yes) Investigator Brochure Version for 06/26/2018 date of 25OCT2018 v17   Key Features of Pamrevlumab (FG-3019) the study drug: a recombinant fully human IgG kappa monoclonal antibody that binds to CTGF and is being developed for treatment of diseases in which tissue fibrosis has a major pathogenic role. In particular, pamrevlumab appears to disrupt a CTGF autocrine loop in mesenchymal cells like myofibroblasts that reduces their recruitment of leukocytes like macrophages, mast and dendritic cells via chemokine secretion. This disruption results in collapse of the cellular crosstalk that drives tissue remodeling.  Clinical Research Coordinator / Research RN note : This visit for [name] with DOB: 1956/07/18 on 06/26/2018 for the above protocol is Visit/Encounter # week 3  and is for purpose of research . The consent for this encounter is under Protocol Version amend 1.0 and  is currently IRB approved. The subject expressed continued interest and consent in continuing as a study subject. Subject confirmed that there was  no change in contact information (e.g. address, telephone, email). Subject thanked for participation in research and contribution to science.   In this visit 06/26/2018 the subject returned to [facility] day treatment center for his week 3 infusion visit. The subject had all pre treatment assessments performed as per required by above stated protocol. During the pre infusion review, the subject reported no changes in medications, and no new AE's. Subject has not had any non drug therapies or procedures since last visit. The subject was infused at 09:00. The nurse attending was Lonzo Candy, who placed the IV on the subjects right hand. Infusion was started at 0900 and infusion was completed at 1000. All medication was infused to the subject. The subject remained on site and continued to be monitored until 1100. All Post assessments were completed as per the above stated protocol. Subject Left the facility at 1117 with no complaints. Subject will return in 3 weeks for next treatment.   Please refer to the subjects paper source binder for detailed accounts of today's visit.  Signed by  T. [name] BS, CPhT, [facility]  Clinical Research Coordinator [place], [place] 12:35 PM 06/26/2018

## 2018-07-03 NOTE — Addendum Note (Signed)
Encounter addended by: Claudean Kinds, RN on: 07/03/2018 3:58 PM  Actions taken: Charge Capture section accepted

## 2018-07-05 ENCOUNTER — Telehealth: Payer: Self-pay | Admitting: Internal Medicine

## 2018-07-05 ENCOUNTER — Other Ambulatory Visit: Payer: PPO

## 2018-07-05 DIAGNOSIS — Z794 Long term (current) use of insulin: Principal | ICD-10-CM

## 2018-07-05 DIAGNOSIS — E785 Hyperlipidemia, unspecified: Secondary | ICD-10-CM

## 2018-07-05 DIAGNOSIS — I4891 Unspecified atrial fibrillation: Secondary | ICD-10-CM | POA: Diagnosis not present

## 2018-07-05 DIAGNOSIS — E1100 Type 2 diabetes mellitus with hyperosmolarity without nonketotic hyperglycemic-hyperosmolar coma (NKHHC): Secondary | ICD-10-CM

## 2018-07-05 NOTE — Telephone Encounter (Signed)
Thanks   SIGNATURE    Dr. Kalman ShanMurali Merl Bommarito, M.D., F.C.C.P,  Pulmonary and Critical Care Medicine Staff Physician, [facility] Center Director - Interstitial Lung Disease  Program  Pulmonary Fibrosis [facility] - Care Center Network at [facility] [place], [place], [id number]  Pager: [phone], If no answer or between  15:00h - 7:00h: call [phone] Telephone: [phone]  2:26 PM 07/05/2018

## 2018-07-05 NOTE — Telephone Encounter (Signed)
[facility] resarch coordinator [facility].  Patient called research offie  call  1. Patient [facility] and if he needs to keep it - > patient can cancel this visit. We can schedule SOC visit someitme during the course of his research study based on mutual convenience.   .2. Charlotta Newtonobert S Karan also told research coordinator home phone app suggests he might have A Fib and has upciming cards appt 07/09/18. Per subject started after last infusion (research CRC to inquire exact onset from patient). Hx reviewd and noted May 2019 cards note < "He is on supplemental oxygen for pulmonary fibrosis and is followed by Dr. [name].  He is also followed by Dr. Ladona Ridgelaylor for Wolff-Parkinson-White syndrome and SVT.  He underwent an EP study and catheter ablation of the left posterior accessory pathway and since his ablation he denies any recurrent arrhythmia." EKG in May 2019 and July 2019 were sinus.   Plan - research CRC to call [place] [facility] office and have them cancel 07/09/18 [facility]  - Research CRC to inquire onset and how patient noticed it details over phone 07/05/2018 - willl await card notes before we determine past hx v AE and severity   Thanks    SIGNATURE    Dr. Kalman ShanMurali Aundria Bitterman, M.D., F.C.C.P,  Pulmonary and Critical Care Medicine Staff Physician, [facility] Center Director - Interstitial Lung Disease  Program  Pulmonary Fibrosis [facility] - Care Center Network at [facility] [place], [place], [id number]  Pager: [phone], If no answer or between  15:00h - 7:00h: call [phone] Telephone: [phone]  9:45 AM 07/05/2018   -

## 2018-07-05 NOTE — Telephone Encounter (Signed)
Spoke with Pulmonary Front desk regarding Jerry Montgomery and have [facility] as to it was not needed at this time. I have placed a call to Jerry Montgomery and left a message with his wife (due to him being unavailable) informing him of the cancellation of the appointment scheduled on the 17th of September. Will follow up with the subject post Cards visit on [id number] to further assess patient complaints of Afib.

## 2018-07-07 ENCOUNTER — Other Ambulatory Visit: Payer: Self-pay | Admitting: Family Medicine

## 2018-07-07 DIAGNOSIS — E038 Other specified hypothyroidism: Secondary | ICD-10-CM

## 2018-07-09 ENCOUNTER — Encounter: Payer: Self-pay | Admitting: Family Medicine

## 2018-07-09 ENCOUNTER — Ambulatory Visit (INDEPENDENT_AMBULATORY_CARE_PROVIDER_SITE_OTHER): Payer: PPO | Admitting: Family Medicine

## 2018-07-09 ENCOUNTER — Ambulatory Visit: Payer: PPO | Admitting: Internal Medicine

## 2018-07-09 VITALS — BP 110/68 | HR 64 | Temp 98.0°F | Resp 24 | Ht 73.0 in | Wt 256.0 lb

## 2018-07-09 DIAGNOSIS — I4891 Unspecified atrial fibrillation: Secondary | ICD-10-CM

## 2018-07-09 NOTE — Addendum Note (Signed)
Addended by: Legrand RamsWILLIS, Taran Haynesworth B on: 07/09/2018 04:52 PM   Modules accepted: Orders

## 2018-07-09 NOTE — Progress Notes (Signed)
Subjective:    Patient ID: Jerry Montgomery, male    DOB: [birth year], 62 y.o.   MRN: [id number]  HPI  Patient is a very pleasant 62 year old Caucasian male with a significant past cardiovascular history.  I have copied the history from his recent cardiology appointment for a brief synopsis:  [name] has history of known coronary artery disease in May 2004 underwent stenting of his proximal LAD and proximal circumflex coronary arteries. In July 2008 he developed a new 85% stenosis of the mid circumflex vessel at which time a 3.5x13 mm Cypher stent was inserted. His 2 previously placed stents remain patent. At his last catheterization in March 2011 he was found to have 95% stenosis between the tube previously placed stents in the circumflex and a new 3.0x20 mm post and was inserted to cover the mid area and also cover the proximal region. His LAD stent was patent with 40% proximal and mid stenosis obtuse marginal 1 vessel at 60-70% narrowing the distal circumflex had 30% narrowing and a nondominant right coronary artery had 80% stenosis.   He also has been treated for Wolff-Parkinson-White syndrome by Dr. Lovena Le with ablation for SVT.    Recently has been entered in an investigational study under the care of his pulmonologist for idiopathic pulmonary fibrosis.  Since he has been receiving the investigational drug, he has noticed palpitations and tachyarrhythmias.  His smart phone has an app that has alerted him for possible atrial fibrillation.  Symptoms have been particularly bad over the last 2 weeks.  Patient reports feeling his heart race and skip.  He presents today for further evaluation.  On presentation the patient has an irregularly irregular rhythm.  EKG shows an irregularly irregular rhythm consistent with atrial fibrillation.  There is no evidence of ischemia however there are Q waves in the inferior leads suggesting previous inferior infarction.  Patient's heart rate throughout today's  encounter is between 110 and 120 bpm.  There is no evidence of fluid overload or congestive heart failure on exam.  He is sitting comfortably talking without increased respiratory effort.  He is currently taking Toprol-XL 100 mg a day    Past Medical History:  Diagnosis Date  . Acute diastolic heart failure ([place])   . Atrial fibrillation ([place])   . Atrial tachycardia ([place])   . CAD (coronary artery disease)   . COPD (chronic obstructive pulmonary disease) ([place])   . Hyperlipidemia   . Hypertension   . Hypothyroidism   . On home oxygen therapy    "3L; 24/7" (04/15/2015)  . Pneumothorax on right 4/11  . Postinflammatory pulmonary fibrosis ([place])   . Sleep apnea    "won't use CPAP" (04/14/2105)  . SVT (supraventricular tachycardia) ([place])    Per Dr. Tanna Furry note  . Type II diabetes mellitus ([place])   . WPW (Wolff-Parkinson-White syndrome)    Per Dr. Tanna Furry Note   Past Surgical History:  Procedure Laterality Date  . BILATERAL VATS ABLATION Right   . CARDIAC CATHETERIZATION  05/03/2007   patent stents  . CARDIAC CATHETERIZATION N/A 05/18/2015   Procedure: Right Heart Cath;  Surgeon: Belva Crome, MD;  Location: [facility];  Service: Cardiovascular;  Laterality: N/A;  . CORONARY ANGIOPLASTY WITH STENT PLACEMENT  03/05/2003; 2008; 01/13/2010   PCI & Stent  LAD & mid CX; stent to CX  (I've got 4 stents total" (04/15/2015)  . ELECTROPHYSIOLOGIC STUDY N/A 04/14/2015   Procedure: SVT Ablation;  Surgeon: Evans Lance, MD;  Location: [facility];  Service: Cardiovascular;  Laterality: N/A;  . KNEE ARTHROSCOPY Right 01/22/2013   Procedure: RIGHT ARTHROSCOPY KNEE WITH DEBRIDEMENT, abrasion chondroplasty of lateral tibial plateau, debridement of partial tear of ACL, menisectomy;  Surgeon: Tobi Bastos, MD;  Location: [facility];  Service: Orthopedics;  Laterality: Right;  . KNEE SURGERY  2012   "reattached quad"  . LUNG LOBECTOMY Right   . SHOULDER ARTHROSCOPY W/ ROTATOR CUFF REPAIR  Bilateral   . VIDEO ASSISTED THORACOSCOPY Left 07/19/2015   Procedure: LEFT VIDEO ASSISTED THORACOSCOPY WITH LEFT UPPER AND LOWER LUNG BIOPSY;  Surgeon: Melrose Nakayama, MD;  Location: [place] [facility];  Service: Thoracic;  Laterality: Left;  Marland Kitchen VIDEO BRONCHOSCOPY N/A 07/19/2015   Procedure: VIDEO BRONCHOSCOPY WITH FLUORO;  Surgeon: Melrose Nakayama, MD;  Location: [facility];  Service: Thoracic;  Laterality: N/A;   Current Outpatient Medications on File Prior to Visit  Medication Sig Dispense Refill  . ALPRAZolam (XANAX) 0.5 MG tablet Take 1 tablet (0.5 mg total) by mouth at bedtime as needed for anxiety. 30 tablet 0  . aspirin 81 MG tablet Take 81 mg by mouth every morning.     Marland Kitchen atorvastatin (LIPITOR) 40 MG tablet Take 1 tablet by mouth every night at bedtime 30 tablet 2  . blood glucose meter kit and supplies KIT Dispense based on patient and insurance preference. Use up to four times daily as directed. (FOR ICD E11.65) 1 each 0  . Blood Glucose Monitoring Suppl (ONE TOUCH ULTRA 2) w/Device KIT Use to monitor FSBS 4x daily for fluctuating blood sugars. Dx: E11.65. 1 each 0  . Blood Glucose Monitoring Suppl (ONE TOUCH ULTRA SYSTEM KIT) w/Device KIT Use to monitor FSBS 4x daily for fluctuating blood sugars. Dx: E11.65. 1 each 1  . clopidogrel (PLAVIX) 75 MG tablet Take 1 tablet by mouth every day 90 tablet 2  . dapagliflozin propanediol (FARXIGA) 10 MG TABS tablet Take 10 mg by mouth daily. 30 tablet 0  . furosemide (LASIX) 20 MG tablet Take 1 tablet (20 mg total) by mouth daily as needed for fluid (fluid). 90 tablet 2  . insulin aspart (NOVOLOG) 100 UNIT/ML injection 15 UNITS WITH BREAKFAST AND 15 UNITS WITH LUNCH, 20 UNITS WITH SUPPER 60 mL 5  . insulin glargine (LANTUS) 100 UNIT/ML injection Inject 0.6 mLs (60 Units total) into the skin at bedtime. 60 mL 3  . isosorbide mononitrate (IMDUR) 60 MG 24 hr tablet Take 60 mg by mouth every day 90 tablet 3  . levothyroxine (SYNTHROID, LEVOTHROID) 125 MCG  tablet Take 1 tablet (125 mcg total) by mouth daily. 90 tablet 2  . Menthol, Topical Analgesic, (BIOFREEZE EX) Apply 1 application topically as needed (for pain).    . metFORMIN (GLUCOPHAGE) 850 MG tablet Take 1 tablet by mouth twice a day 180 tablet 2  . metoprolol succinate (TOPROL-XL) 100 MG 24 hr tablet Take 1 tablet (100 mg total) by mouth daily. Take with or immediately following a meal. 90 tablet 3  . metoprolol succinate (TOPROL-XL) 50 MG 24 hr tablet Take 1 tablet (50 mg total) by mouth daily. Take with or immediately following a meal. 90 tablet 3  . nitroGLYCERIN (NITROSTAT) 0.4 MG SL tablet Place 1 tablet (0.4 mg total) under the tongue every 5 (five) minutes as needed for chest pain. 25 tablet 1  . omega-3 acid ethyl esters (LOVAZA) 1 g capsule Take 2 capsules (2 g total) by mouth 2 (two) times daily. 360 capsule 3  .  ONE TOUCH ULTRA TEST test strip Use to monitor FSBS 4x daily for fluctuating blood sugars. Dx: E11.65. 400 each 3  . ONETOUCH DELICA LANCETS FINE MISC Test up to 4 times a day as directed. 100 each 5  . SYRINGE/NEEDLE, DISP, 1 ML (B-D SYRINGE/NEEDLE 1CC/25GX5/8) 25G X 5/8" 1 ML MISC As directed 100 each 5  . tiotropium (SPIRIVA) 18 MCG inhalation capsule Place 18 mcg into inhaler and inhale daily. Reported on 02/25/2016    . zolpidem (AMBIEN) 10 MG tablet Take 1 tablet by mouth at bedtime. 90 tablet 1  . albuterol (PROVENTIL HFA;VENTOLIN HFA) 108 (90 Base) MCG/ACT inhaler Inhale 2 puffs into the lungs every 6 (six) hours as needed for wheezing. (Patient not taking: Reported on 04/04/2018) 3 Inhaler 3   No current facility-administered medications on file prior to visit.    Allergies  Allergen Reactions  . Niaspan [Niacin] Itching  . Ofev [Nintedanib] Other (See Comments)    Lethargic, wiped out  . Pirfenidone   . Altace [Ramipril] Other (See Comments)    Cough    Social History   Socioeconomic History  . Marital status: Single    Spouse name: Not on file  .  Number of children: 1  . Years of education: Not on file  . Highest education level: Not on file  Occupational History    Employer: [employer]  . Financial resource strain: Not on file  . Food insecurity:    Worry: Not on file    Inability: Not on file  . Transportation needs:    Medical: Not on file    Non-medical: Not on file  Tobacco Use  . Smoking status: Former Smoker    Packs/day: 1.50    Years: 35.00    Pack years: 52.50    Types: Cigarettes    Last attempt to quit: 10/24/2007    Years since quitting: 10.7  . Smokeless tobacco: Never Used  Substance and Sexual Activity  . Alcohol use: Yes    Alcohol/week: 0.0 standard drinks    Comment: 04/15/2015 "I've had a couple drinks in the last 8 months"  . Drug use: No  . Sexual activity: Not Currently  Lifestyle  . Physical activity:    Days per week: Not on file    Minutes per session: Not on file  . Stress: Not on file  Relationships  . Social connections:    Talks on phone: Not on file    Gets together: Not on file    Attends religious service: Not on file    Active member of club or organization: Not on file    Attends meetings of clubs or organizations: Not on file    Relationship status: Not on file  . Intimate partner violence:    Fear of current or ex partner: Not on file    Emotionally abused: Not on file    Physically abused: Not on file    Forced sexual activity: Not on file  Other Topics Concern  . Not on file  Social History Narrative  . Not on file       Review of Systems  All other systems reviewed and are negative.      Objective:   Physical Exam  Constitutional: He appears well-developed and well-nourished. No distress.  Neck: No JVD present. No thyromegaly present.  Cardiovascular: S1 normal, S2 normal, normal heart sounds and normal pulses. An irregularly irregular rhythm present. Tachycardia present. Exam reveals no gallop and  no friction rub.  No murmur  heard. Pulmonary/Chest: No stridor. No respiratory distress. He has no wheezes. He has no rales. He exhibits no tenderness.  Abdominal: Soft. Bowel sounds are normal.  Musculoskeletal: He exhibits no edema.  Skin: He is not diaphoretic.  Vitals reviewed.         Assessment & Plan:  Atrial fibrillation, unspecified type ([place]) - Plan: EKG 12-Lead, TSH  Patient is on aspirin and Plavix.  I believe he needs Eliquis now due to the atrial fibrillation.  Therefore I will have him discontinue Plavix but continue aspirin.  Begin Eliquis 5 mg p.o. twice daily.  Consult cardiology.  Check TSH to ensure adequate dosage of levothyroxine.  Focus on rate control by increasing Toprol-XL from 100 mg a day to 200 mg a day and recheck heart rate later this week on Friday.  Most lab work is listed below and aside from poorly controlled diabetes showed no serious abnormalities.  We will discuss the management of his sugars on Friday. Lab on 07/05/2018  Component Date Value Ref Range Status  . Cholesterol 07/05/2018 107  <200 mg/dL Final  . HDL 07/05/2018 30* >40 mg/dL Final  . Triglycerides 07/05/2018 162* <150 mg/dL Final  . LDL Cholesterol (Calc) 07/05/2018 53  mg/dL (calc) Final   Comment: Reference range: <100 . Desirable range <100 mg/dL for primary prevention;   <70 mg/dL for patients with CHD or diabetic patients  with > or = 2 CHD risk factors. Marland Kitchen LDL-C is now calculated using the Martin-Hopkins  calculation, which is a validated novel method providing  better accuracy than the Friedewald equation in the  estimation of LDL-C.  Cresenciano Genre et al. Annamaria Helling. 1031;594(58): 2061-2068  (http://education.[employer].com/faq/FAQ164)   . Total CHOL/HDL Ratio 07/05/2018 3.6  <5.0 (calc) Final  . Non-HDL Cholesterol (Calc) 07/05/2018 77  <130 mg/dL (calc) Final   Comment: For patients with diabetes plus 1 major ASCVD risk  factor, treating to a non-HDL-C goal of <100 mg/dL  (LDL-C of <70 mg/dL) is  considered a therapeutic  option.   . Glucose, Bld 07/05/2018 150* 65 - 99 mg/dL Final   Comment: .            Fasting reference interval . For someone without known diabetes, a glucose value >125 mg/dL indicates that they may have diabetes and this should be confirmed with a follow-up test. .   . BUN 07/05/2018 14  7 - 25 mg/dL Final  . Creat 07/05/2018 1.53* 0.70 - 1.25 mg/dL Final   Comment: For patients >82 years of age, the reference limit for Creatinine is approximately 13% higher for people identified as African-American. .   Havery Moros Ratio 07/05/2018 9  6 - 22 (calc) Final  . Sodium 07/05/2018 140  135 - 146 mmol/L Final  . Potassium 07/05/2018 4.4  3.5 - 5.3 mmol/L Final  . Chloride 07/05/2018 102  98 - 110 mmol/L Final  . CO2 07/05/2018 26  20 - 32 mmol/L Final  . Calcium 07/05/2018 9.2  8.6 - 10.3 mg/dL Final  . Total Protein 07/05/2018 6.6  6.1 - 8.1 g/dL Final  . Albumin 07/05/2018 4.0  3.6 - 5.1 g/dL Final  . Globulin 07/05/2018 2.6  1.9 - 3.7 g/dL (calc) Final  . AG Ratio 07/05/2018 1.5  1.0 - 2.5 (calc) Final  . Total Bilirubin 07/05/2018 0.9  0.2 - 1.2 mg/dL Final  . Alkaline phosphatase (APISO) 07/05/2018 72  40 - 115 U/L Final  . AST 07/05/2018  18  10 - 35 U/L Final  . ALT 07/05/2018 16  9 - 46 U/L Final  . Hgb A1c MFr Bld 07/05/2018 8.7* <5.7 % of total Hgb Final   Comment: For someone without known diabetes, a hemoglobin A1c value of 6.5% or greater indicates that they may have  diabetes and this should be confirmed with a follow-up  test. . For someone with known diabetes, a value <7% indicates  that their diabetes is well controlled and a value  greater than or equal to 7% indicates suboptimal  control. A1c targets should be individualized based on  duration of diabetes, age, comorbid conditions, and  other considerations. . Currently, no consensus exists regarding use of hemoglobin A1c for diagnosis of diabetes for children. .   .  Mean Plasma Glucose 07/05/2018 203  (calc) Final  . eAG (mmol/L) 07/05/2018 11.2  (calc) Final  . WBC 07/05/2018 9.0  3.8 - 10.8 Thousand/uL Final  . RBC 07/05/2018 5.07  4.20 - 5.80 Million/uL Final  . Hemoglobin 07/05/2018 15.4  13.2 - 17.1 g/dL Final  . HCT 07/05/2018 45.7  38.5 - 50.0 % Final  . MCV 07/05/2018 90.1  80.0 - 100.0 fL Final  . MCH 07/05/2018 30.4  27.0 - 33.0 pg Final  . MCHC 07/05/2018 33.7  32.0 - 36.0 g/dL Final  . RDW 07/05/2018 13.5  11.0 - 15.0 % Final  . Platelets 07/05/2018 229  140 - 400 Thousand/uL Final  . MPV 07/05/2018 9.7  7.5 - 12.5 fL Final  . Neutro Abs 07/05/2018 5,994  1,500 - 7,800 cells/uL Final  . Lymphs Abs 07/05/2018 1,782  850 - 3,900 cells/uL Final  . WBC mixed population 07/05/2018 945  200 - 950 cells/uL Final  . Eosinophils Absolute 07/05/2018 243  15 - 500 cells/uL Final  . Basophils Absolute 07/05/2018 36  0 - 200 cells/uL Final  . Neutrophils Relative % 07/05/2018 66.6  % Final  . Total Lymphocyte 07/05/2018 19.8  % Final  . Monocytes Relative 07/05/2018 10.5  % Final  . Eosinophils Relative 07/05/2018 2.7  % Final  . Basophils Relative 07/05/2018 0.4  % Final

## 2018-07-10 DIAGNOSIS — J8410 Pulmonary fibrosis, unspecified: Secondary | ICD-10-CM | POA: Diagnosis not present

## 2018-07-10 DIAGNOSIS — J438 Other emphysema: Secondary | ICD-10-CM | POA: Diagnosis not present

## 2018-07-10 DIAGNOSIS — J449 Chronic obstructive pulmonary disease, unspecified: Secondary | ICD-10-CM | POA: Diagnosis not present

## 2018-07-10 DIAGNOSIS — R269 Unspecified abnormalities of gait and mobility: Secondary | ICD-10-CM | POA: Diagnosis not present

## 2018-07-10 LAB — LIPID PANEL
Cholesterol: 107 mg/dL (ref <200)
HDL: 30 mg/dL — ABNORMAL LOW (ref >40)
LDL Cholesterol (Calc): 53 mg/dL (calc)
NON-HDL CHOLESTEROL (CALC): 77 mg/dL (ref <130)
Total CHOL/HDL Ratio: 3.6 (calc) (ref <5.0)
Triglycerides: 162 mg/dL — ABNORMAL HIGH (ref <150)

## 2018-07-10 LAB — COMPREHENSIVE METABOLIC PANEL
AG Ratio: 1.5 (calc) (ref 1.0–2.5)
ALT: 16 U/L (ref 9–46)
AST: 18 U/L (ref 10–35)
Albumin: 4 g/dL (ref 3.6–5.1)
Alkaline phosphatase (APISO): 72 U/L (ref 40–115)
BUN/Creatinine Ratio: 9 (calc) (ref 6–22)
BUN: 14 mg/dL (ref 7–25)
CHLORIDE: 102 mmol/L (ref 98–110)
CO2: 26 mmol/L (ref 20–32)
CREATININE: 1.53 mg/dL — AB (ref 0.70–1.25)
Calcium: 9.2 mg/dL (ref 8.6–10.3)
GLOBULIN: 2.6 g/dL (ref 1.9–3.7)
Glucose, Bld: 150 mg/dL — ABNORMAL HIGH (ref 65–99)
Potassium: 4.4 mmol/L (ref 3.5–5.3)
Sodium: 140 mmol/L (ref 135–146)
Total Bilirubin: 0.9 mg/dL (ref 0.2–1.2)
Total Protein: 6.6 g/dL (ref 6.1–8.1)

## 2018-07-10 LAB — CBC WITH DIFFERENTIAL/PLATELET
BASOS PCT: 0.4 %
Basophils Absolute: 36 cells/uL (ref 0–200)
Eosinophils Absolute: 243 cells/uL (ref 15–500)
Eosinophils Relative: 2.7 %
HEMATOCRIT: 45.7 % (ref 38.5–50.0)
Hemoglobin: 15.4 g/dL (ref 13.2–17.1)
Lymphs Abs: 1782 cells/uL (ref 850–3900)
MCH: 30.4 pg (ref 27.0–33.0)
MCHC: 33.7 g/dL (ref 32.0–36.0)
MCV: 90.1 fL (ref 80.0–100.0)
MPV: 9.7 fL (ref 7.5–12.5)
Monocytes Relative: 10.5 %
Neutro Abs: 5994 cells/uL (ref 1500–7800)
Neutrophils Relative %: 66.6 %
PLATELETS: 229 10*3/uL (ref 140–400)
RBC: 5.07 10*6/uL (ref 4.20–5.80)
RDW: 13.5 % (ref 11.0–15.0)
TOTAL LYMPHOCYTE: 19.8 %
WBC: 9 10*3/uL (ref 3.8–10.8)
WBCMIX: 945 {cells}/uL (ref 200–950)

## 2018-07-10 LAB — TEST AUTHORIZATION

## 2018-07-10 LAB — HEMOGLOBIN A1C
HEMOGLOBIN A1C: 8.7 %{Hb} — AB (ref <5.7)
Mean Plasma Glucose: 203 (calc)
eAG (mmol/L): 11.2 (calc)

## 2018-07-10 LAB — TSH: TSH: 2.4 m[IU]/L (ref 0.40–4.50)

## 2018-07-11 ENCOUNTER — Other Ambulatory Visit: Payer: Self-pay

## 2018-07-11 ENCOUNTER — Other Ambulatory Visit: Payer: Self-pay | Admitting: Cardiology

## 2018-07-11 ENCOUNTER — Emergency Department (HOSPITAL_COMMUNITY): Payer: PPO

## 2018-07-11 ENCOUNTER — Encounter (HOSPITAL_COMMUNITY): Payer: Self-pay

## 2018-07-11 ENCOUNTER — Emergency Department (HOSPITAL_COMMUNITY)
Admission: EM | Admit: 2018-07-11 | Discharge: 2018-07-11 | Disposition: A | Discharge status: Home / Self Care | Payer: PPO | Attending: Emergency Medicine | Admitting: Emergency Medicine

## 2018-07-11 DIAGNOSIS — I480 Paroxysmal atrial fibrillation: Secondary | ICD-10-CM | POA: Diagnosis not present

## 2018-07-11 DIAGNOSIS — Z7982 Long term (current) use of aspirin: Secondary | ICD-10-CM | POA: Insufficient documentation

## 2018-07-11 DIAGNOSIS — R0602 Shortness of breath: Secondary | ICD-10-CM | POA: Diagnosis not present

## 2018-07-11 DIAGNOSIS — I110 Hypertensive heart disease with heart failure: Secondary | ICD-10-CM | POA: Diagnosis not present

## 2018-07-11 DIAGNOSIS — Z87891 Personal history of nicotine dependence: Secondary | ICD-10-CM | POA: Diagnosis not present

## 2018-07-11 DIAGNOSIS — E11319 Type 2 diabetes mellitus with unspecified diabetic retinopathy without macular edema: Secondary | ICD-10-CM | POA: Diagnosis not present

## 2018-07-11 DIAGNOSIS — Z794 Long term (current) use of insulin: Secondary | ICD-10-CM | POA: Insufficient documentation

## 2018-07-11 DIAGNOSIS — Z955 Presence of coronary angioplasty implant and graft: Secondary | ICD-10-CM | POA: Insufficient documentation

## 2018-07-11 DIAGNOSIS — I5031 Acute diastolic (congestive) heart failure: Secondary | ICD-10-CM | POA: Insufficient documentation

## 2018-07-11 DIAGNOSIS — Z79899 Other long term (current) drug therapy: Secondary | ICD-10-CM | POA: Diagnosis not present

## 2018-07-11 DIAGNOSIS — J449 Chronic obstructive pulmonary disease, unspecified: Secondary | ICD-10-CM | POA: Insufficient documentation

## 2018-07-11 DIAGNOSIS — R079 Chest pain, unspecified: Secondary | ICD-10-CM | POA: Diagnosis not present

## 2018-07-11 DIAGNOSIS — J811 Chronic pulmonary edema: Secondary | ICD-10-CM | POA: Diagnosis not present

## 2018-07-11 DIAGNOSIS — I2510 Atherosclerotic heart disease of native coronary artery without angina pectoris: Secondary | ICD-10-CM | POA: Diagnosis not present

## 2018-07-11 LAB — CBC
HCT: 44.8 % (ref 39.0–52.0)
Hemoglobin: 14.4 g/dL (ref 13.0–17.0)
MCH: 30.3 pg (ref 26.0–34.0)
MCHC: 32.1 g/dL (ref 30.0–36.0)
MCV: 94.3 fL (ref 78.0–100.0)
PLATELETS: 195 10*3/uL (ref 150–400)
RBC: 4.75 MIL/uL (ref 4.22–5.81)
RDW: 13.4 % (ref 11.5–15.5)
WBC: 8.3 10*3/uL (ref 4.0–10.5)

## 2018-07-11 LAB — BASIC METABOLIC PANEL
Anion gap: 13 (ref 5–15)
BUN: 16 mg/dL (ref 8–23)
CALCIUM: 9 mg/dL (ref 8.9–10.3)
CO2: 24 mmol/L (ref 22–32)
CREATININE: 1.51 mg/dL — AB (ref 0.61–1.24)
Chloride: 102 mmol/L (ref 98–111)
GFR calc non Af Amer: 47 mL/min — ABNORMAL LOW (ref >60)
GFR, EST AFRICAN AMERICAN: 55 mL/min — AB (ref >60)
Glucose, Bld: 166 mg/dL — ABNORMAL HIGH (ref 70–99)
Potassium: 4.1 mmol/L (ref 3.5–5.1)
Sodium: 139 mmol/L (ref 135–145)

## 2018-07-11 LAB — I-STAT TROPONIN, ED
TROPONIN I, POC: 0.04 ng/mL (ref 0.00–0.08)
Troponin i, poc: 0 ng/mL (ref 0.00–0.08)

## 2018-07-11 NOTE — ED Triage Notes (Signed)
Pt presents with increase of shortness of breath, with L sided chest and abdominal pain that began today.  Pt was seen by MD, had metoprolol doubled and added eliquis instead of plavix.

## 2018-07-11 NOTE — Consult Note (Addendum)
Cardiology Consultation:   Patient ID: [id number]; [id number]; 1956-01-14   Admit date: 07/11/2018 Date of Consult: 07/11/2018  Primary Care Provider: [name], MD Primary Cardiologist: Dr. Shelva Majestic, MD  Patient Profile:   Jerry Montgomery is a 62 y.o. male with a hx of atrial fibrillation on Eliquis (recent dx two days ago per PCP), CAD s/p PCI of proximal LAD and proximal circumflex (2004, 2008, 2011), hyperlipidemia, hypertension, DM 2, diastolic EBR(8309), history of Wolf Parkinson's white and SVT s/p catheter ablation of the left posterior accessory pathway (Dr. Lovena Le) and COPD on home O2 (followed by Dr. Melvyn Novas) who is being seen today for the evaluation of atrial fibrillation with RVR at the request of Caryl Ada, [place].   History of Present Illness:   Jerry Montgomery 62 year old male with a history stated above who presented to [facility] on 07/11/2018 with complaints of progressive fatigue which began approximately 2 weeks ago. Patient monitors his cardiac rhythm with an app on his phone Jodelle Red) in which he has noticed an increased frequency of atrial fibrillation alerts with elevated heart rate.  He recently started an experimental drug for his severe pulmonary fibrosis per pulmonary medicine approximately 2 weeks ago.  Since that time, he has noticed more frequent episodes of atrial fibrillation alerted to his phone along with his increasing fatigue.  Patient attempted to make an appointment with his PCP, Dr. Alvino Chapel last week however he was on vacation.  He saw Dr. Alvino Chapel on Tuesday, 07/09/2018 in which an EKG revealed atrial fibrillation.  He was started on Eliquis 5 mg twice daily at that time and his metoprolol was increased as well.  Given his persistent symptoms and ongoing phone alerts of elevated atrial rates, patient proceeded to [facility] for further evaluation.  He denies chest pain, palpitations, orthopnea, LE swelling, dizziness, presyncopal or syncopal episodes.  He has  baseline dyspnea with exertion secondary to severe pulmonary fibrosis.  In the ED, EKG revealed NSR with frequent PVCs and no acute ischemic changes however, telemetry reveals atrial fibrillation with moderate, intermittent rate control.  I-STAT troponin elevated 0.04>>0.00.  CXR with pulmonary edema superimposed on chronic interstitial lung disease with small right-sided pleural effusion.  Of note he was last seen by Dr. Claiborne Billings, his primary cardiologist on 03/05/2018.  During their last encounter in January 2019 he had no recurrent episodes of chest pain and was following his cardiac rhythms on a Chad app on his phone.  He admitted to intermittent lower extremity swelling in which he takes furosemide on an as-needed basis. Plans were to see him back for follow-up in 1 years timeframe.  Past Medical History:  Diagnosis Date  . Acute diastolic heart failure ([place])   . Atrial fibrillation ([place])   . Atrial tachycardia ([place])   . CAD (coronary artery disease)   . COPD (chronic obstructive pulmonary disease) ([place])   . Hyperlipidemia   . Hypertension   . Hypothyroidism   . On home oxygen therapy    "3L; 24/7" (04/15/2015)  . Pneumothorax on right 4/11  . Postinflammatory pulmonary fibrosis ([place])   . Sleep apnea    "won't use CPAP" (04/14/2105)  . SVT (supraventricular tachycardia) ([place])    Per Dr. Tanna Furry note  . Type II diabetes mellitus ([place])   . WPW (Wolff-Parkinson-White syndrome)    Per Dr. Tanna Furry Note    Past Surgical History:  Procedure Laterality Date  . BILATERAL VATS ABLATION Right   . CARDIAC CATHETERIZATION  05/03/2007  patent stents  . CARDIAC CATHETERIZATION N/A 05/18/2015   Procedure: Right Heart Cath;  Surgeon: Belva Crome, MD;  Location: [facility];  Service: Cardiovascular;  Laterality: N/A;  . CORONARY ANGIOPLASTY WITH STENT PLACEMENT  03/05/2003; 2008; 01/13/2010   PCI & Stent  LAD & mid CX; stent to CX  (I've got 4 stents total" (04/15/2015)  .  ELECTROPHYSIOLOGIC STUDY N/A 04/14/2015   Procedure: SVT Ablation;  Surgeon: Evans Lance, MD;  Location: [facility];  Service: Cardiovascular;  Laterality: N/A;  . KNEE ARTHROSCOPY Right 01/22/2013   Procedure: RIGHT ARTHROSCOPY KNEE WITH DEBRIDEMENT, abrasion chondroplasty of lateral tibial plateau, debridement of partial tear of ACL, menisectomy;  Surgeon: Tobi Bastos, MD;  Location: [facility];  Service: Orthopedics;  Laterality: Right;  . KNEE SURGERY  2012   "reattached quad"  . LUNG LOBECTOMY Right   . SHOULDER ARTHROSCOPY W/ ROTATOR CUFF REPAIR Bilateral   . VIDEO ASSISTED THORACOSCOPY Left 07/19/2015   Procedure: LEFT VIDEO ASSISTED THORACOSCOPY WITH LEFT UPPER AND LOWER LUNG BIOPSY;  Surgeon: Melrose Nakayama, MD;  Location: [facility];  Service: Thoracic;  Laterality: Left;  Marland Kitchen VIDEO BRONCHOSCOPY N/A 07/19/2015   Procedure: VIDEO BRONCHOSCOPY WITH FLUORO;  Surgeon: Melrose Nakayama, MD;  Location: [facility];  Service: Thoracic;  Laterality: N/A;     Prior to Admission medications   Medication Sig Start Date End Date Taking? Authorizing Provider  ALPRAZolam Duanne Moron) 0.5 MG tablet Take 1 tablet (0.5 mg total) by mouth at bedtime as needed for anxiety. 07/27/17  Yes [name], MD  apixaban (ELIQUIS) 5 MG TABS tablet Take 5 mg by mouth 2 (two) times daily.   Yes [provider]  aspirin 81 MG tablet Take 81 mg by mouth every morning.    Yes [provider]  atorvastatin (LIPITOR) 40 MG tablet Take 1 tablet by mouth every night at bedtime 06/07/18  Yes [name], MD  dapagliflozin propanediol (FARXIGA) 10 MG TABS tablet Take 10 mg by mouth daily. 02/07/18  Yes [name], MD  furosemide (LASIX) 20 MG tablet Take 1 tablet (20 mg total) by mouth daily as needed for fluid (fluid). Patient taking differently: Take 20 mg by mouth daily.  07/08/18  Yes [name], MD  insulin aspart (NOVOLOG) 100 UNIT/ML injection 15 UNITS WITH BREAKFAST AND 15  UNITS WITH LUNCH, 20 UNITS WITH SUPPER 02/07/18  Yes [name], MD  insulin glargine (LANTUS) 100 UNIT/ML injection Inject 0.6 mLs (60 Units total) into the skin at bedtime. 09/11/17  Yes [name], MD  isosorbide mononitrate (IMDUR) 60 MG 24 hr tablet Take 60 mg by mouth every day 11/09/17  Yes [name], MD  levothyroxine (SYNTHROID, LEVOTHROID) 125 MCG tablet Take 1 tablet (125 mcg total) by mouth daily. 07/08/18  Yes [name], MD  Menthol, Topical Analgesic, (BIOFREEZE EX) Apply 1 application topically as needed (for pain).   Yes [provider]  metFORMIN (GLUCOPHAGE) 850 MG tablet Take 1 tablet by mouth twice a day 04/08/18  Yes [name], MD  metoprolol succinate (TOPROL-XL) 100 MG 24 hr tablet Take 1 tablet (100 mg total) by mouth daily. Take with or immediately following a meal. Patient taking differently: Take 200 mg by mouth daily. Take with or immediately following a meal. 11/09/17  Yes Pickard, Cammie Mcgee, MD  nitroGLYCERIN (NITROSTAT) 0.4 MG SL tablet Place 1 tablet (0.4 mg total) under the tongue every  5 (five) minutes as needed for chest pain. 03/11/15  Yes Evans Lance, MD  Omega-3 Fatty Acids (FISH OIL PO) Take 2 capsules by mouth 2 (two) times daily.   Yes [provider]  zolpidem (AMBIEN) 10 MG tablet Take 1 tablet by mouth at bedtime. 04/08/18  Yes [name], MD  albuterol (PROVENTIL HFA;VENTOLIN HFA) 108 (90 Base) MCG/ACT inhaler Inhale 2 puffs into the lungs every 6 (six) hours as needed for wheezing. Patient not taking: Reported on 04/04/2018 03/07/16   [name], MD  blood glucose meter kit and supplies KIT Dispense based on patient and insurance preference. Use up to four times daily as directed. (FOR ICD E11.65) 11/24/15   [name], MD  Blood Glucose Monitoring Suppl (ONE TOUCH ULTRA 2) w/Device KIT Use to monitor FSBS 4x daily for fluctuating blood sugars. Dx: E11.65. 02/26/17   [name], MD    Blood Glucose Monitoring Suppl (ONE TOUCH ULTRA SYSTEM KIT) w/Device KIT Use to monitor FSBS 4x daily for fluctuating blood sugars. Dx: E11.65. 01/04/16   Alycia Rossetti, MD  clopidogrel (PLAVIX) 75 MG tablet Take 1 tablet by mouth every day Patient not taking: Reported on 07/11/2018 04/08/18   [name], MD  omega-3 acid ethyl esters (LOVAZA) 1 g capsule Take 2 capsules (2 g total) by mouth 2 (two) times daily. Patient not taking: Reported on 07/11/2018 03/07/18   Troy Sine, MD  ONE TOUCH ULTRA TEST test strip Use to monitor FSBS 4x daily for fluctuating blood sugars. Dx: E11.65. 11/09/17   [name], MD  [facility] DELICA LANCETS FINE MISC Test up to 4 times a day as directed. 11/10/16   [name], MD  SYRINGE/NEEDLE, DISP, 1 ML (B-D SYRINGE/NEEDLE 1CC/25GX5/8) 25G X 5/8" 1 ML MISC As directed 07/27/17   [name], MD    Inpatient Medications: Scheduled Meds:  Continuous Infusions:  PRN Meds:  Allergies:    Allergies  Allergen Reactions  . Niaspan [Niacin] Itching  . Ofev [Nintedanib] Other (See Comments)    Lethargic, wiped out  . Pirfenidone   . Altace [Ramipril] Other (See Comments)    Cough    Social History:   Social History   Socioeconomic History  . Marital status: Single    Spouse name: Not on file  . Number of children: 1  . Years of education: Not on file  . Highest education level: Not on file  Occupational History    Employer: [employer]  . Financial resource strain: Not on file  . Food insecurity:    Worry: Not on file    Inability: Not on file  . Transportation needs:    Medical: Not on file    Non-medical: Not on file  Tobacco Use  . Smoking status: Former Smoker    Packs/day: 1.50    Years: 35.00    Pack years: 52.50    Types: Cigarettes    Last attempt to quit: 10/24/2007    Years since quitting: 10.7  . Smokeless tobacco: Never Used  Substance and Sexual Activity  . Alcohol use: Yes     Alcohol/week: 0.0 standard drinks    Comment: 04/15/2015 "I've had a couple drinks in the last 8 months"  . Drug use: No  . Sexual activity: Not Currently  Lifestyle  . Physical activity:    Days per week: Not on file    Minutes per session: Not on file  .  Stress: Not on file  Relationships  . Social connections:    Talks on phone: Not on file    Gets together: Not on file    Attends religious service: Not on file    Active member of club or organization: Not on file    Attends meetings of clubs or organizations: Not on file    Relationship status: Not on file  . Intimate partner violence:    Fear of current or ex partner: Not on file    Emotionally abused: Not on file    Physically abused: Not on file    Forced sexual activity: Not on file  Other Topics Concern  . Not on file  Social History Narrative  . Not on file    Family History:   Family History  Problem Relation Age of Onset  . Breast cancer Mother   . Diabetes type II Mother   . Lung cancer Father    Family Status:  Family Status  Relation Name Status  . Mother  Deceased  . Father  Deceased  . MGM  Deceased  . MGF  Deceased  . PGM  Deceased  . PGF  Deceased    ROS:  Please see the history of present illness.  All other ROS reviewed and negative.     Physical Exam/Data:   Vitals:   07/11/18 1345 07/11/18 1400 07/11/18 1415 07/11/18 1500  BP: (!) 138/94 (!) 148/92 (!) 146/66 (!) 154/98  Pulse: 67 (!) 57 (!) 59 75  Resp: 17 (!) _0 Temp:      TempSrc:      SpO2: 94% 93% 93% 95%  Weight:      Height:       No intake or output data in the 24 hours ending 07/11/18 1540 Filed Weights   07/11/18 1256  Weight: 114.3 kg   Body mass index is 32.35 kg/m.   General: Well developed, well nourished, NAD Skin: Warm, dry, intact  Head: Normocephalic, atraumatic, clear, moist mucus membranes. Neck: Negative for carotid bruits. No JVD Lungs:Clear to ausculation bilaterally. No wheezes, rales, or  rhonchi. Breathing is unlabored. Cardiovascular: Irregular with S1 S2. No murmurs, rubs, gallops, or LV heave appreciated. Abdomen: Soft, non-tender, non-distended with normoactive bowel sounds.  No obvious abdominal masses. MSK: Strength and tone appear normal for age. 5/5 in all extremities Extremities: No edema. No clubbing or cyanosis. DP/PT pulses 2+ bilaterally Neuro: Alert and oriented. No focal deficits. No facial asymmetry. MAE spontaneously. Psych: Responds to questions appropriately with normal affect.     EKG:  The EKG was personally reviewed and demonstrates: 07/11/2018 NSR with PVC's Telemetry:  Telemetry was personally reviewed and demonstrates: 07/11/2018 atrial fibrillation with rate control  Relevant CV Studies:  ECHO: Echoardiogram 02/04/2018: Study Conclusions  - Left ventricle: The cavity size was normal. Wall thickness was   normal. Systolic function was mildly to moderately reduced. The   estimated ejection fraction was in the range of 40% to 45%.   Diffuse hypokinesis. Features are consistent with a pseudonormal   left ventricular filling pattern, with concomitant abnormal   relaxation and increased filling pressure (grade 2 diastolic   dysfunction). - Mitral valve: Mildly calcified annulus. There was no significant   regurgitation. - Left atrium: The atrium was moderately dilated. - Right ventricle: The cavity size was normal. Systolic function   was mildly reduced. - Right atrium: The atrium was mildly dilated. - Pulmonary arteries: No complete TR doppler jet so unable  to   estimate PA systolic pressure. - Inferior vena cava: The vessel was normal in size. The   respirophasic diameter changes were in the normal range (>= 50%),   consistent with normal central venous pressure.  Impressions:  - Normal LV size with EF 40-45%, diffuse hypokinesis. Moderate   diastolic dysfunction. Normal RV size with mildly decreased   systolic function. No significant  valvular abnormalities.  CATH: Right heart cardiac catheterization 05/18/2015:  -[place] cardiac output 6.26 L/min, thermodilution output 5.84 L/min.  Pulmonary vascular resistance was 1.88  Laboratory Data:  Chemistry Recent Labs  Lab 07/05/18 1033 07/11/18 1344  NA 140 139  K 4.4 4.1  CL 102 102  CO2 26 24  GLUCOSE 150* 166*  BUN 14 16  CREATININE 1.53* 1.51*  CALCIUM 9.2 9.0  GFRNONAA  --  47*  GFRAA  --  55*  ANIONGAP  --  13    Total Protein  Date Value Ref Range Status  07/05/2018 6.6 6.1 - 8.1 g/dL Final   Albumin  Date Value Ref Range Status  08/07/2016 4.1 3.6 - 5.1 g/dL Final   AST  Date Value Ref Range Status  07/05/2018 18 10 - 35 U/L Final   ALT  Date Value Ref Range Status  07/05/2018 16 9 - 46 U/L Final   Alkaline Phosphatase  Date Value Ref Range Status  08/07/2016 53 40 - 115 U/L Final   Total Bilirubin  Date Value Ref Range Status  07/05/2018 0.9 0.2 - 1.2 mg/dL Final   Hematology Recent Labs  Lab 07/05/18 1033 07/11/18 1344  WBC 9.0 8.3  RBC 5.07 4.75  HGB 15.4 14.4  HCT 45.7 44.8  MCV 90.1 94.3  MCH 30.4 30.3  MCHC 33.7 32.1  RDW 13.5 13.4  PLT 229 195   Cardiac EnzymesNo results for input(s): TROPONINI in the last 168 hours.  Recent Labs  Lab 07/11/18 1350  TROPIPOC 0.04    BNPNo results for input(s): BNP, PROBNP in the last 168 hours.  DDimer No results for input(s): DDIMER in the last 168 hours. TSH:  Lab Results  Component Value Date   TSH 2.40 07/05/2018   Lipids: Lab Results  Component Value Date   CHOL 107 07/05/2018   HDL 30 (L) 07/05/2018   LDLCALC 53 07/05/2018   TRIG 162 (H) 07/05/2018   CHOLHDL 3.6 07/05/2018   HgbA1c: Lab Results  Component Value Date   HGBA1C 8.7 (H) 07/05/2018    Radiology/Studies:  Dg Chest 2 View  Result Date: 07/11/2018 CLINICAL DATA:  62 year old male with a history of shortness of breath EXAM: CHEST - 2 VIEW COMPARISON:  CT 05/17/2018, plain film 11/24/2015 FINDINGS:  Cardiomediastinal silhouette likely unchanged with the heart borders partially obscured by overlying lung and pleural disease. Reticulonodular opacity bilateral lungs, worse at the lower lungs and on the right. Pleuroparenchymal thickening on the right more pronounced than the prior with blunting of the right costophrenic angle and opacification of the costophrenic sulcus on the lateral view. Interlobular septal thickening. Thickening of the fissures on the lateral view. No displaced fracture. IMPRESSION: Pulmonary edema superimposed on chronic interstitial disease, with small right-sided pleural effusion. Atypical infection cannot be excluded. Electronically Signed   By: Corrie Mckusick D.O.   On: 07/11/2018 14:08   Assessment and Plan:   1.  Paroxysmal atrial fibrillation: -Over the past 2 weeks patient has noticed increasing fatigue and has received multiple phone app alerts (KARDIA) of atrial fibrillation with elevated heart rates  after starting an experimental pulmonary fibrosis medication series. Given his symptoms, patient reported his PCP office on 07/09/2018 and was found to be in atrial fibrillation. He was started on Eliquis 5 mg and his metoprolol was increased to 200 mg daily. Given his persistent symptoms patient reported to [facility] for further evaluation -EKG revealed NSR with PVCs upon arrival -CXR with pulmonary edema superimposed on chronic interstitial lung disease and small right-sided pleural effusion -Continue Eliquis 5 mg, increase Toprol-XL from 44m to 150 mg daily>> BP stable at 145/97 -Continue Lasix 227mfor from mild volume overload on CXR -Last TSH WNL  -We will have our office contact him for follow up appointment to reassess rate>> message has been sent to scheduling department -[id number] = 4 (CHF, hypertension, vascular disease, DM)  2.  History of WPW/SVT s/p ablation per Dr. TaLovena Le-Status post ablation left posterior accessory pathway 2016 per Dr. TaLovena Le/2016 -Last  followed with Dr. TaLovena Le/2016 for post ablation follow-up -Denies palpitations  3.  History of CAD s/p multiple PCIs to LAD and left circumflex arteries: -Initially found to have CAD in May 2004 underwent stenting of his proximal LAD and proximal circumflex arteries.  In 04/2007 he developed new 85% stenosis of mid circumflex vessel which another stent was inserted.  Last cardiac catheterization in March 2011 was found to have a 95% stenosis between previous stenting in which she had an additional stent placed. -Denies anginal symptoms -Continue ASA, atorvastatin 40 mg, metoprolol XL 150 mg, Imdur 60 mg   4.  Pulmonary fibrosis/COPD:  -Chest CT in October 2014 suggested bronchiectasis, groundglass attenuation, interstitial reticulation, and honeycombing followed by Dr. RaOrma Flaming-On home supplemental oxygen -Echocardiogram 06/10/2014 with LVEF of 55 to 60% with G2 DD with moderate left atrial dilation and moderate pulmonary hypertension with PA pressure 48 mmHg>> centrally referred to pulmonary for outpatient follow-up and management -O2 saturations stable at 93%>> continue Progress at 4 to 6 L -Continue home inhalers  5.  DM2: -SSI while inpatient for glucose control -On home insulin, metformin -Last hemoglobin A1c, 8.7 on 07/05/2018  6.  Acute on chronic kidney disease stage II: -Creatinine, 1.51 today -Baseline appears to be in the 1.3-1.5 range -Continue to avoid nephrotoxic medications -Follow daily BMET   7.  Chronic diastolic CHF: -CXR on admission with evidence of fluid volume overload -Last echocardiogram 02/04/2018 with LVEF of 4045% with diffuse hypocontractility and G2 DD -We will check BNP -Continue home dose Lasix 20 mg -Not appear to be fluid volume overloaded on exam today  8.  Hypertension: -Stable, 145/97,154/98, 146/66 -Will have him increase  Toprol-XL from 5038mo 150m73mily   For questions or updates, please contact [facility] consult  [email or web address] for contact info under Cardiology/STEMI.   Signed, [facility] Pager: [phone]/[phone] 3:40 PM  As above, patient seen and examined.  Briefly he is a 62 y76r old male with past medical history of coronary artery disease with prior PCI, diabetes mellitus, hypertension, hyperlipidemia, history of WPW and SVT status post ablation, diastolic congestive heart failure, pulmonary fibrosis for evaluation of paroxysmal atrial fibrillation.  Over the past 2 weeks the patient complains of increased fatigue.  He denies increased dyspnea.  No exertional chest pain or palpitations.  He has noticed on his iPhone that his heart rate has intermittently been 130-150.  He increased his metoprolol from 50 mg daily to 100 mg daily on his own.  He was seen by his primary care physician and started  on apixaban as he was noted to have paroxysmal atrial fibrillation.  He has continued to have elevated heart rates but then increased to 150 mg daily.  He presented to the emergency room for further evaluation.  Chest ray shows interstitial lung disease with pulmonary edema and small right effusion.  Laboratory show creatinine 1.51.  Troponin 0 0.04 and 0.00.  Recent SH normal.  Electrocardiogram showed probable atrial flutter versus coarse atrial fibrillation with PVCs.  Cannot rule out prior anterior infarct.  Patient then converted to sinus rhythm and is in sinus rhythm at time of evaluation.  1 paroxysmal atrial flutter/fibrillation-electrocardiogram previously showed newly diagnosed atrial flutter versus coarse atrial fibrillation.  He has converted to sinus rhythm.  For now I would favor rate control as it is not clear to me he is symptomatic.  He does complain of fatigue but this may be related to a new medication he is receiving for pulmonary fibrosis.  We will continue Toprol at 150 mg daily.  Recent TSH normal.  Continue apixaban 5 mg twice daily. CHADS vasc 3.  We will arrange a repeat  echocardiogram as an outpatient.  I would not consider ablation as given his baseline pulmonary disease he will not likely hold sinus rhythm long-term and will be susceptible to atrial fibrillation as well.  2 acute on chronic combined systolic/diastolic congestive heart failure-last echocardiogram showed ejection fraction 40 to 45%.  Chest x-ray with mild edema.  Continue Lasix at present dose.  There may be a contribution from recent atrial arrhythmias which should hopefully improve with better rate control.  He is not complaining of dyspnea.  Not volume overloaded on examination.  3 interstitial lung disease-follow-up pulmonary.  4 history of coronary artery disease-continue statin.  Given need for apixaban would favor discontinuing aspirin and Plavix long-term.  5 hypertension-follow blood pressure closely at home given higher dose of Toprol.  Further adjustments as an outpatient.  Patient can be discharged from a cardiac standpoint. [facility] will sign off.   Medication Recommendations: Meds as outlined above Other recommendations (labs, testing, etc): We will arrange outpatient echocardiogram. Follow up as an outpatient: Follow-up with Dr. Claiborne Billings 2 to 4 weeks following discharge.  Kirk Ruths, MD

## 2018-07-11 NOTE — Discharge Instructions (Addendum)
Increase your Toprol XL to 150 mg daily.  Continue your Eliquis and Lasix.  Follow up with Cardiology - you should receive a call for an appointment in the next few days.  Call your family doctor for follow up.  Get rechecked immediately if you have any new or concerning symptoms.

## 2018-07-11 NOTE — ED Provider Notes (Addendum)
[facility] EMERGENCY DEPARTMENT Provider Note   CSN: [id number] Arrival date & time: 07/11/18  1251     History   Chief Complaint No chief complaint on file.   HPI Jerry Montgomery is a 62 y.o. male.  The history is provided by the patient. No language interpreter was used.  Shortness of Breath  This is a chronic problem. The average episode lasts 2 days. The problem occurs continuously.The current episode started 2 days ago. The problem has been gradually worsening. Associated symptoms include chest pain. He has tried nothing for the symptoms. The treatment provided no relief. He has had prior hospitalizations. Associated medical issues include COPD, chronic lung disease, CAD and heart failure.  Pt reports he has pulmonary fibrosis.  Pt is on 8 liters of 02.  Pt reports this week he has been short of breath and his heart rate has been high.  Pt reports his heart rate was 120-140.  Pt doubled his metoprolol.  Pt saw Dr. Alvino Chapel and was started   Past Medical History:  Diagnosis Date  . Acute diastolic heart failure ([place])   . Atrial fibrillation ([place])   . Atrial tachycardia ([place])   . CAD (coronary artery disease)   . COPD (chronic obstructive pulmonary disease) ([place])   . Hyperlipidemia   . Hypertension   . Hypothyroidism   . On home oxygen therapy    "3L; 24/7" (04/15/2015)  . Pneumothorax on right 4/11  . Postinflammatory pulmonary fibrosis ([place])   . Sleep apnea    "won't use CPAP" (04/14/2105)  . SVT (supraventricular tachycardia) ([place])    Per Dr. Tanna Furry note  . Type II diabetes mellitus ([place])   . WPW (Wolff-Parkinson-White syndrome)    Per Dr. Tanna Furry Note    Patient Active Problem List   Diagnosis Date Noted  . Hypotension 07/19/2017  . Research subject 06/27/2016  . Obesity 06/27/2016  . Other fatigue 09/14/2015  . Pulmonary fibrosis ([place])   . 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 Chronic respiratory failure with hypoxia ([place]) 03/09/2015  . Acute  renal insufficiency 03/09/2015  . Chronic renal insufficiency 03/09/2015  . Other emphysema ([place]) 03/09/2015  . Diabetes mellitus type 2 without retinopathy ([place]) 02/05/2015  . Acute respiratory failure ([place])   . Atrial fibrillation with RVR ([place])   . Acute renal failure syndrome ([place])   . Acute pulmonary edema (H[facility])   . Acute heart failure ([place])   . Acute diastolic heart failure ([place])   . Acute respiratory failure with hypoxia ([place])   . Atrial tachycardia ([place])   . Hypokalemia   . 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 SVT (supraventricular tachycardia) ([place]) 02/02/2015  . Chest wall pain 06/17/2013  . Chondral lesion 01/22/2013  . Cough 03/22/2012  . Pneumonia, organism unspecified(486) 03/07/2012  . DIABETES MELLITUS, TYPE II 02/23/2010  . COPD II 02/23/2010  . HYPERLIPIDEMIA 02/22/2010  . Essential hypertension 02/22/2010  . Coronary atherosclerosis 02/22/2010  . BRONCHIECTASIS 02/22/2010  . PULMONARY FIBROSIS 02/22/2010    Past Surgical History:  Procedure Laterality Date  . BILATERAL VATS ABLATION Right   . CARDIAC CATHETERIZATION  05/03/2007   patent stents  . CARDIAC CATHETERIZATION N/A 05/18/2015   Procedure: Right Heart Cath;  Surgeon: Belva Crome, MD;  Location: [facility];  Service: Cardiovascular;  Laterality: N/A;  . CORONARY ANGIOPLASTY WITH STENT PLACEMENT  03/05/2003; 2008; 01/13/2010   PCI & Stent  LAD & mid CX; stent to CX  (I've got  4 stents total" (04/15/2015)  . ELECTROPHYSIOLOGIC STUDY N/A 04/14/2015   Procedure: SVT Ablation;  Surgeon: Evans Lance, MD;  Location: [facility];  Service: Cardiovascular;  Laterality: N/A;  . KNEE ARTHROSCOPY Right 01/22/2013   Procedure: RIGHT ARTHROSCOPY KNEE WITH DEBRIDEMENT, abrasion chondroplasty of lateral tibial plateau, debridement of partial tear of ACL, menisectomy;  Surgeon: Tobi Bastos, MD;  Location: [facility];  Service: Orthopedics;  Laterality: Right;  . KNEE SURGERY  2012   "reattached  quad"  . LUNG LOBECTOMY Right   . SHOULDER ARTHROSCOPY W/ ROTATOR CUFF REPAIR Bilateral   . VIDEO ASSISTED THORACOSCOPY Left 07/19/2015   Procedure: LEFT VIDEO ASSISTED THORACOSCOPY WITH LEFT UPPER AND LOWER LUNG BIOPSY;  Surgeon: Melrose Nakayama, MD;  Location: [facility];  Service: Thoracic;  Laterality: Left;  Marland Kitchen VIDEO BRONCHOSCOPY N/A 07/19/2015   Procedure: VIDEO BRONCHOSCOPY WITH FLUORO;  Surgeon: Melrose Nakayama, MD;  Location: [facility];  Service: Thoracic;  Laterality: N/A;        Home Medications    Prior to Admission medications   Medication Sig Start Date End Date Taking? Authorizing Provider  ALPRAZolam Duanne Moron) 0.5 MG tablet Take 1 tablet (0.5 mg total) by mouth at bedtime as needed for anxiety. 07/27/17  Yes [name], MD  apixaban (ELIQUIS) 5 MG TABS tablet Take 5 mg by mouth 2 (two) times daily.   Yes [provider]  aspirin 81 MG tablet Take 81 mg by mouth every morning.    Yes [provider]  atorvastatin (LIPITOR) 40 MG tablet Take 1 tablet by mouth every night at bedtime 06/07/18  Yes [name], MD  dapagliflozin propanediol (FARXIGA) 10 MG TABS tablet Take 10 mg by mouth daily. 02/07/18  Yes [name], MD  furosemide (LASIX) 20 MG tablet Take 1 tablet (20 mg total) by mouth daily as needed for fluid (fluid). Patient taking differently: Take 20 mg by mouth daily.  07/08/18  Yes [name], MD  insulin aspart (NOVOLOG) 100 UNIT/ML injection 15 UNITS WITH BREAKFAST AND 15 UNITS WITH LUNCH, 20 UNITS WITH SUPPER 02/07/18  Yes [name], MD  insulin glargine (LANTUS) 100 UNIT/ML injection Inject 0.6 mLs (60 Units total) into the skin at bedtime. 09/11/17  Yes [name], MD  isosorbide mononitrate (IMDUR) 60 MG 24 hr tablet Take 60 mg by mouth every day 11/09/17  Yes [name], MD  levothyroxine (SYNTHROID, LEVOTHROID) 125 MCG tablet Take 1 tablet (125 mcg total) by mouth daily. 07/08/18  Yes [name],  MD  Menthol, Topical Analgesic, (BIOFREEZE EX) Apply 1 application topically as needed (for pain).   Yes [provider]  metFORMIN (GLUCOPHAGE) 850 MG tablet Take 1 tablet by mouth twice a day 04/08/18  Yes [name], MD  metoprolol succinate (TOPROL-XL) 100 MG 24 hr tablet Take 1 tablet (100 mg total) by mouth daily. Take with or immediately following a meal. Patient taking differently: Take 200 mg by mouth daily. Take with or immediately following a meal. 11/09/17  Yes Pickard, Cammie Mcgee, MD  nitroGLYCERIN (NITROSTAT) 0.4 MG SL tablet Place 1 tablet (0.4 mg total) under the tongue every 5 (five) minutes as needed for chest pain. 03/11/15  Yes Evans Lance, MD  Omega-3 Fatty Acids (FISH OIL PO) Take 2 capsules by mouth 2 (two) times daily.   Yes [provider]  zolpidem (AMBIEN) 10 MG tablet Take 1 tablet by mouth at bedtime. 04/08/18  Yes [name], MD  albuterol (PROVENTIL HFA;VENTOLIN HFA) 108 (90 Base) MCG/ACT inhaler Inhale 2 puffs into the lungs every 6 (six) hours as needed for wheezing. Patient not taking: Reported on 04/04/2018 03/07/16   [name], MD  blood glucose meter kit and supplies KIT Dispense based on patient and insurance preference. Use up to four times daily as directed. (FOR ICD E11.65) 11/24/15   [name], MD  Blood Glucose Monitoring Suppl (ONE TOUCH ULTRA 2) w/Device KIT Use to monitor FSBS 4x daily for fluctuating blood sugars. Dx: E11.65. 02/26/17   [name], MD  Blood Glucose Monitoring Suppl (ONE TOUCH ULTRA SYSTEM KIT) w/Device KIT Use to monitor FSBS 4x daily for fluctuating blood sugars. Dx: E11.65. 01/04/16   Alycia Rossetti, MD  clopidogrel (PLAVIX) 75 MG tablet Take 1 tablet by mouth every day Patient not taking: Reported on 07/11/2018 04/08/18   [name], MD  omega-3 acid ethyl esters (LOVAZA) 1 g capsule Take 2 capsules (2 g total) by mouth 2 (two) times daily. Patient not taking: Reported on  07/11/2018 03/07/18   Troy Sine, MD  ONE TOUCH ULTRA TEST test strip Use to monitor FSBS 4x daily for fluctuating blood sugars. Dx: E11.65. 11/09/17   [name], MD  [facility] DELICA LANCETS FINE MISC Test up to 4 times a day as directed. 11/10/16   [name], MD  SYRINGE/NEEDLE, DISP, 1 ML (B-D SYRINGE/NEEDLE 1CC/25GX5/8) 25G X 5/8" 1 ML MISC As directed 07/27/17   [name], MD    Family History Family History  Problem Relation Age of Onset  . Breast cancer Mother   . Diabetes type II Mother   . Lung cancer Father     Social History Social History   Tobacco Use  . Smoking status: Former Smoker    Packs/day: 1.50    Years: 35.00    Pack years: 52.50    Types: Cigarettes    Last attempt to quit: 10/24/2007    Years since quitting: 10.7  . Smokeless tobacco: Never Used  Substance Use Topics  . Alcohol use: Yes    Alcohol/week: 0.0 standard drinks    Comment: 04/15/2015 "I've had a couple drinks in the last 8 months"  . Drug use: No     Allergies   Niaspan [niacin]; Ofev [nintedanib]; Pirfenidone; and Altace [ramipril]   Review of Systems Review of Systems  Respiratory: Positive for shortness of breath.   Cardiovascular: Positive for chest pain.  All other systems reviewed and are negative.    Physical Exam Updated Vital Signs BP (!) 154/98   Pulse 75   Temp 98.6 F (37 C) (Oral)   Resp 20   Ht _0  (1.88 m)   Wt 114.3 kg   SpO2 95%   BMI 32.35 kg/m   Physical Exam  Constitutional: He appears well-developed and well-nourished.  HENT:  Head: Normocephalic.  Right Ear: External ear normal.  Left Ear: External ear normal.  Nose: Nose normal.  Mouth/Throat: Oropharynx is clear and moist.  Eyes: Pupils are equal, round, and reactive to light.  Neck: Normal range of motion.  Cardiovascular: Normal rate.  Pulmonary/Chest: Effort normal.  Decreased breath sounds, rhonchi,   Abdominal: Soft.  Skin: Skin is warm.  Nursing note and  vitals reviewed.    ED Treatments / Results  Labs (all labs ordered are listed, but only abnormal results are displayed) Labs Reviewed  BASIC METABOLIC PANEL - Abnormal;  Notable for the following components:      Result Value   Glucose, Bld 166 (*)    Creatinine, Ser 1.51 (*)    GFR calc non Af Amer 47 (*)    GFR calc Af Amer 55 (*)    All other components within normal limits  CBC  I-STAT TROPONIN, ED  I-STAT TROPONIN, ED    EKG EKG Interpretation  Date/Time:  Thursday July 11 2018 13:21:20 EDT Ventricular Rate:  71 PR Interval:    QRS Duration: 88 QT Interval:  527 QTC Calculation: 573 R Axis:   -156 Text Interpretation:  Age not entered, assumed to be  62 years old for purpose of ECG interpretation Sinus rhythm Ventricular premature complex Probable right ventricular hypertrophy Nonspecific T abnrm, anterolateral leads Prolonged QT interval Confirmed by Quintella Reichert [phone]) on 07/11/2018 3:14:12 PM   Radiology Dg Chest 2 View  Result Date: 07/11/2018 CLINICAL DATA:  62 year old male with a history of shortness of breath EXAM: CHEST - 2 VIEW COMPARISON:  CT 05/17/2018, plain film 11/24/2015 FINDINGS: Cardiomediastinal silhouette likely unchanged with the heart borders partially obscured by overlying lung and pleural disease. Reticulonodular opacity bilateral lungs, worse at the lower lungs and on the right. Pleuroparenchymal thickening on the right more pronounced than the prior with blunting of the right costophrenic angle and opacification of the costophrenic sulcus on the lateral view. Interlobular septal thickening. Thickening of the fissures on the lateral view. No displaced fracture. IMPRESSION: Pulmonary edema superimposed on chronic interstitial disease, with small right-sided pleural effusion. Atypical infection cannot be excluded. Electronically Signed   By: Corrie Mckusick D.O.   On: 07/11/2018 14:08    Procedures Procedures (including critical care  time)  Medications Ordered in ED Medications - No data to display   Initial Impression / Assessment and Plan / ED Course  I have reviewed the triage vital signs and the nursing notes.  Pertinent labs & imaging results that were available during my care of the patient were reviewed by me and considered in my medical decision making (see chart for details).     MDM  Chest xray reviewed.  Pt has some evidence of chf along with pulmonary fibrosis.  Cardiology consulted to see pt.   Final Clinical Impressions(s) / ED Diagnoses   Final diagnoses:  Chest pain, unspecified type  Shortness of breath    ED Discharge Orders    None    Pt's care turned over to Dr. Ralene Bathe.  At [facility], PA-C 07/11/18 [place], PA-C 07/11/18 1547    Jola Schmidt, MD 07/11/18 [phone]

## 2018-07-12 ENCOUNTER — Ambulatory Visit: Payer: PPO | Admitting: Family Medicine

## 2018-07-17 ENCOUNTER — Encounter: Payer: PPO | Admitting: *Deleted

## 2018-07-17 ENCOUNTER — Ambulatory Visit (HOSPITAL_COMMUNITY)
Admission: RE | Admit: 2018-07-17 | Discharge: 2018-07-17 | Disposition: A | Discharge status: Home / Self Care | Payer: PPO | Source: Ambulatory Visit | Attending: Internal Medicine | Admitting: Internal Medicine

## 2018-07-17 DIAGNOSIS — Z006 Encounter for examination for normal comparison and control in clinical research program: Secondary | ICD-10-CM | POA: Insufficient documentation

## 2018-07-17 DIAGNOSIS — J84112 Idiopathic pulmonary fibrosis: Secondary | ICD-10-CM | POA: Insufficient documentation

## 2018-07-17 MED ORDER — STUDY - FIBROGEN - PAMREVLUMAB OR PLACEBO 10 MG/ML IV INFUSION (PI-RAMASWAMY)
30.0000 mg/kg | Freq: Once | INTRAVENOUS | Status: AC
  Administered 2018-07-17: 3429 mg via INTRAVENOUS
  Filled 2018-07-17: qty 350

## 2018-07-17 NOTE — Research (Signed)
Title: FGCL-3019-091 (FibroGen Study) is a Phase 3, randomized, double-blind, placebo-controlled multicenter international study to evaluate evaluate the efficacy and safety of 30 mg/kg IV infusions of pamrevlumab administered every 3 weeks for 52 weeks as compared to placebo in subjects with Idiopathic Pulmonary Fibrosis. Primary end point is: change in FVC from baseline at week 52  Protocol #: FGCL-3019-091, Clinical Trials #: [id number] Sponsor: [email or web address]  [facility] [place], [place], [place])  Protocol Version for 07/17/2018 date of  17MAR2019 Consent Version for 07/17/2018 date of 12AUG2019 Investigator Brochure Version for 07/17/2018 date of 25OCT2018  Key Features of Pamrevlumab (FG-3019) the study drug: a recombinant fully human IgG kappa monoclonal antibody that binds to CTGF and is being developed for treatment of diseases in which tissue fibrosis has a major pathogenic role. In particular, pamrevlumab appears to disrupt a CTGF autocrine loop in mesenchymal cells like myofibroblasts that reduces their recruitment of leukocytes like macrophages, mast and dendritic cells via chemokine secretion. This disruption results in collapse of the cellular crosstalk that drives tissue remodeling.  Clinical Research Coordinator / Research RN note : This visit for [name] with DOB: January 25, 1956 on 07/17/2018 for the above protocol is Visit/Encounter Week 6  and is for purpose of Research . The consent for this encounter is under Protocol Version 1.0 and is currently IRB approved.Subject expressed continued interest and consent in continuing as a study subject. Subject confirmed that there was no change in contact information (e.g. address, telephone, email). Subject thanked for participation in research and contribution to science.   In this visit 07/17/2018 the medical history of the last 3 weeks since last infusion were reviewed with the subject and study coordinators [name], and Carron Curie. Dr. [name] also present for the review via video conference. The subject reported that since the day following last treatment visit (beginning 05SEP2019) he had began receiving periodic alerts via is mobile cardiac monitoring Application of occurrences of Afib. The subject reported that he was seen by his PCP on 15SEP2019 regarding Afib, increased heart rate, and Shortness of breath. During that visit an ECG was performed and results confirmed Afib. The PCP Dr. Ernst Breach decided to make adjustments to his medications and Discontinued the Plavix and started the subject on Eliquis 5mg  beginning 15SEP2019 as well. Additionally the PCP increased the subjects Toprol-XL from 100mg  daily to 200mg  daily. However the subject reports that he only took 200mg  once on 17SEP2019. States that since that date (17SEP2019) he has been taking 150mg  total daily. Patient presents to clinic today in what appears to be Afib according to his cardiac mobile application but overall the patient states he feels good. The subject is here for his third treatment and per protocol IP can be infused over 30 minutes if desired, however after PI review of current events of subjects decided it would be best to continue with the hour long infusion of IP for today's dose. In today's visit the subject underwent all required study assessments as per the above stated protocol. Nurses for this visit were Alferd Apa. Who placed the IV on the subjects right hand and administered the study drug. Lonzo Candy RN was also available and aided in subject prep for IP admniistration. Study Drug was started at 09:48 and concluded at 10:41 IP was prepared by [name] from pharmacy. The subject was then monitored for 1 hour post infusion and experienced no AEs or changes in medication. The subject was discharged from hospital at 11:41 and will return in  3 weeks for his week 9 visit. For further information regarding todays visit please refer to  subjects paper source binder.   Signed by  T. [name] BS, [facility] CPhT  Clinical Research Coordinator [place], [place] 16:10 07/17/2018

## 2018-07-18 ENCOUNTER — Ambulatory Visit (HOSPITAL_COMMUNITY): Payer: PPO | Attending: Cardiovascular Disease

## 2018-07-18 ENCOUNTER — Other Ambulatory Visit: Payer: Self-pay

## 2018-07-18 DIAGNOSIS — Z87891 Personal history of nicotine dependence: Secondary | ICD-10-CM | POA: Insufficient documentation

## 2018-07-18 DIAGNOSIS — E785 Hyperlipidemia, unspecified: Secondary | ICD-10-CM | POA: Diagnosis not present

## 2018-07-18 DIAGNOSIS — I959 Hypotension, unspecified: Secondary | ICD-10-CM | POA: Diagnosis not present

## 2018-07-18 DIAGNOSIS — I471 Supraventricular tachycardia: Secondary | ICD-10-CM | POA: Insufficient documentation

## 2018-07-18 DIAGNOSIS — I509 Heart failure, unspecified: Secondary | ICD-10-CM | POA: Diagnosis not present

## 2018-07-18 DIAGNOSIS — E669 Obesity, unspecified: Secondary | ICD-10-CM | POA: Insufficient documentation

## 2018-07-18 DIAGNOSIS — I110 Hypertensive heart disease with heart failure: Secondary | ICD-10-CM | POA: Diagnosis not present

## 2018-07-18 DIAGNOSIS — I059 Rheumatic mitral valve disease, unspecified: Secondary | ICD-10-CM | POA: Insufficient documentation

## 2018-07-18 DIAGNOSIS — Z6833 Body mass index (BMI) 33.0-33.9, adult: Secondary | ICD-10-CM | POA: Diagnosis not present

## 2018-07-18 DIAGNOSIS — I456 Pre-excitation syndrome: Secondary | ICD-10-CM | POA: Diagnosis not present

## 2018-07-18 DIAGNOSIS — J449 Chronic obstructive pulmonary disease, unspecified: Secondary | ICD-10-CM | POA: Insufficient documentation

## 2018-07-18 DIAGNOSIS — I480 Paroxysmal atrial fibrillation: Secondary | ICD-10-CM

## 2018-07-19 NOTE — Research (Signed)
Title: FGCL-3019-091 (FibroGen Study) is a Phase 3, randomized, double-blind, placebo-controlled multicenter international study to evaluate evaluate the efficacy and safety of 30 mg/kg IV infusions of pamrevlumab administered every 3 weeks for 52 weeks as compared to placebo in subjects with Idiopathic Pulmonary Fibrosis. Primary end point is: change in FVC from baseline at week 52  Protocol #: FGCL-3019-091, Clinical Trials #: [id number] Sponsor: [email or web address]  [facility] [place], [place], [place]) ..................................................... This visit for [name] with DOB: Nov 01, 1955 on 07/17/2018 for the above protocol is Visit/Encounter # infusion #3  and is for purpose of research . Subject/LAR expressed continued interest and consent in continuing as a study subject. Subject thanked for participation in research and contribution to science.   This documentation is coming 2 days later because of issues to the electronic medical record and cosignature.  On July 16, 2018 1 day before the admission review of the chart indicated patient had suffered adverse event of atrial fibrillation.  Therefore in the afternoon of July 16, 2018 I asked principal investigator personally telephoned Dr. Olga Millers his cardiologist.  Dr. Jens Som got information about the IP and also based on time sequence did not see any contraindication for patient proceeding with research infusion on July 17, 2018.  I also reviewed the investigator brochure regarding atrial fibrillation and volume overload and discovered the following information  -  Overall there is not much information in the IB with regards to AFib or volume overload-inducing AFib .  The maximum volume the subject can receive is only 410 mL per infusion. In one of the Phase 2 studies, 2/89 (2.7%) subjects reported HF as a TEAE (but no mention of AFib with regards to this). Dyspnea is one of the most common TEAEs and can certainly be  attributed to the IP. Dyspnea occurred in up to 24% of subjects in some of the Phase 2 studies.  As far as the subject developing AFib ->  There is only minimal mention of AFib in the IB. In one of the phase 2 studies, pamrevlumab 15 mg/kg vs. pamrevlumab 30 mg/kg, 2/89 (2.2%) subjects developed AFib, 1 subject in each group. In one of these subjects, AFib led to discontinuation of the study drug. Other than this, there is no other mention of AFib.   On the day of the infusion in the morning I conducted a video conference with the patient at the infusion center.  I was in another location and could not make it to the infusion center.  The video conference was on a secure line.  Patient reported feeling stable.  He did not express any symptoms of volume overload.  The research coordinators were also on the video conference.     Based on all this information we decided to go ahead with the infusion but not to shorten the time. Research coordinator then reported patient tolerated the infusion fine without any problems.   We will record the cardiac issues as AE . No evidence of SAE      SIGNATURE    Dr. Kalman Shan, M.D., F.C.C.P, ACRP-CPI Pulmonary and Critical Care Medicine Research Investigator, [facility] @ [facility] Staff Physician, [facility] Center Director - Interstitial Lung Disease  Program  Pulmonary Fibrosis [facility] -  Pulmonary and [facility] @ [facility] [place], [place], [address]  Pager: [phone], If no answer or between  15:00h - 7:00h: call [phone] Telephone: [phone]  2:19 PM 07/19/2018

## 2018-07-24 ENCOUNTER — Ambulatory Visit: Payer: PPO | Admitting: Cardiology

## 2018-07-24 ENCOUNTER — Encounter: Payer: Self-pay | Admitting: Cardiology

## 2018-07-24 VITALS — BP 126/66 | HR 84 | Ht 73.0 in | Wt 252.0 lb

## 2018-07-24 DIAGNOSIS — Z9861 Coronary angioplasty status: Secondary | ICD-10-CM

## 2018-07-24 DIAGNOSIS — I456 Pre-excitation syndrome: Secondary | ICD-10-CM | POA: Diagnosis not present

## 2018-07-24 DIAGNOSIS — I480 Paroxysmal atrial fibrillation: Secondary | ICD-10-CM

## 2018-07-24 DIAGNOSIS — Z794 Long term (current) use of insulin: Secondary | ICD-10-CM | POA: Diagnosis not present

## 2018-07-24 DIAGNOSIS — I2510 Atherosclerotic heart disease of native coronary artery without angina pectoris: Secondary | ICD-10-CM | POA: Diagnosis not present

## 2018-07-24 DIAGNOSIS — I10 Essential (primary) hypertension: Secondary | ICD-10-CM | POA: Diagnosis not present

## 2018-07-24 DIAGNOSIS — J8410 Pulmonary fibrosis, unspecified: Secondary | ICD-10-CM | POA: Diagnosis not present

## 2018-07-24 DIAGNOSIS — E119 Type 2 diabetes mellitus without complications: Secondary | ICD-10-CM

## 2018-07-24 DIAGNOSIS — I4891 Unspecified atrial fibrillation: Secondary | ICD-10-CM | POA: Diagnosis not present

## 2018-07-24 DIAGNOSIS — IMO0001 Reserved for inherently not codable concepts without codable children: Secondary | ICD-10-CM

## 2018-07-24 DIAGNOSIS — Z7901 Long term (current) use of anticoagulants: Secondary | ICD-10-CM | POA: Diagnosis not present

## 2018-07-24 NOTE — Assessment & Plan Note (Signed)
Followed by [facility], he is on chronic O2 and is currently taking an experimental drug

## 2018-07-24 NOTE — Patient Instructions (Signed)
Medication Instructions:  Your physician recommends that you continue on your current medications as directed. Please refer to the Current Medication list given to you today.  Follow-Up: 3 months with Dr. Kelly  Any Other Special Instructions Will Be Listed Below (If Applicable).     If you need a refill on your cardiac medications before your next appointment, please call your pharmacy.    

## 2018-07-24 NOTE — Assessment & Plan Note (Signed)
Controlled.  

## 2018-07-24 NOTE — Assessment & Plan Note (Signed)
Recent onset though this has been noted in 2016 as well. He is now on increased Toprol and Eliquis-off Plavix

## 2018-07-24 NOTE — Assessment & Plan Note (Signed)
CHADS VASC=4, he is on Eliquis 

## 2018-07-24 NOTE — Assessment & Plan Note (Signed)
S/P RFA-Dr [name] follows

## 2018-07-24 NOTE — Progress Notes (Signed)
07/24/2018 Jerry Montgomery   1956/01/27  [id number]  Primary Physician Dennard Schaumann Cammie Mcgee, MD Primary Cardiologist: Dr Claiborne Billings  HPI:  Pleasant 62 y.o. male with a hx of recently (days ago) diagnosed atrial fibrillation, placed on Eliquis by his PCP, CAD s/p PCI of proximal LAD and proximal circumflex (2004, new CFX in 2008, ISR CFX 2011), hyperlipidemia, hypertension, DM 2, diastolic XTK(2409), history of WPW s/p RFA followed by Dr. Lovena Le) and COPD and pulmonary fibrosis on home O2 followed by Dr. Chase Caller, who presented to the ED 07/11/18 after his [place] app noted frequent AF with RVR and the patient noted fatigue and SOB. In the ED he was noted to have NSR. He had CHF on his CXR. It was suggested he increase his Lasix to 20 mg daily and his Toprol to 100 mg daily with an extra 50 mg Q pm for sustained HR > 130. He is in the office today for follow up. He denies any chest pain. He still notices his HR > 130 but only for short periods, he is otherwise unaware of any tachycardia.    Current Outpatient Medications  Medication Sig Dispense Refill  . albuterol (PROVENTIL HFA;VENTOLIN HFA) 108 (90 Base) MCG/ACT inhaler Inhale 2 puffs into the lungs every 6 (six) hours as needed for wheezing. 3 Inhaler 3  . ALPRAZolam (XANAX) 0.5 MG tablet Take 1 tablet (0.5 mg total) by mouth at bedtime as needed for anxiety. 30 tablet 0  . apixaban (ELIQUIS) 5 MG TABS tablet Take 5 mg by mouth 2 (two) times daily.    Marland Kitchen aspirin 81 MG tablet Take 81 mg by mouth every morning.     Marland Kitchen atorvastatin (LIPITOR) 40 MG tablet Take 1 tablet by mouth every night at bedtime 30 tablet 2  . blood glucose meter kit and supplies KIT Dispense based on patient and insurance preference. Use up to four times daily as directed. (FOR ICD E11.65) 1 each 0  . Blood Glucose Monitoring Suppl (ONE TOUCH ULTRA 2) w/Device KIT Use to monitor FSBS 4x daily for fluctuating blood sugars. Dx: E11.65. 1 each 0  . Blood Glucose Monitoring Suppl (ONE  TOUCH ULTRA SYSTEM KIT) w/Device KIT Use to monitor FSBS 4x daily for fluctuating blood sugars. Dx: E11.65. 1 each 1  . dapagliflozin propanediol (FARXIGA) 10 MG TABS tablet Take 10 mg by mouth daily. 30 tablet 0  . furosemide (LASIX) 20 MG tablet Take 1 tablet (20 mg total) by mouth daily as needed for fluid (fluid). 90 tablet 2  . insulin aspart (NOVOLOG) 100 UNIT/ML injection 15 UNITS WITH BREAKFAST AND 15 UNITS WITH LUNCH, 20 UNITS WITH SUPPER 60 mL 5  . insulin glargine (LANTUS) 100 UNIT/ML injection Inject 0.6 mLs (60 Units total) into the skin at bedtime. 60 mL 3  . isosorbide mononitrate (IMDUR) 60 MG 24 hr tablet Take 60 mg by mouth every day 90 tablet 3  . levothyroxine (SYNTHROID, LEVOTHROID) 125 MCG tablet Take 1 tablet (125 mcg total) by mouth daily. 90 tablet 2  . Menthol, Topical Analgesic, (BIOFREEZE EX) Apply 1 application topically as needed (for pain).    . metFORMIN (GLUCOPHAGE) 850 MG tablet Take 1 tablet by mouth twice a day 180 tablet 2  . metoprolol succinate (TOPROL-XL) 100 MG 24 hr tablet Take 1 tablet (100 mg total) by mouth daily. Take with or immediately following a meal. 90 tablet 3  . nitroGLYCERIN (NITROSTAT) 0.4 MG SL tablet Place 1 tablet (0.4 mg total) under the  tongue every 5 (five) minutes as needed for chest pain. 25 tablet 1  . omega-3 acid ethyl esters (LOVAZA) 1 g capsule Take 2 capsules (2 g total) by mouth 2 (two) times daily. 360 capsule 3  . Omega-3 Fatty Acids (FISH OIL PO) Take 2 capsules by mouth 2 (two) times daily.    . ONE TOUCH ULTRA TEST test strip Use to monitor FSBS 4x daily for fluctuating blood sugars. Dx: E11.65. 400 each 3  . ONETOUCH DELICA LANCETS FINE MISC Test up to 4 times a day as directed. 100 each 5  . SYRINGE/NEEDLE, DISP, 1 ML (B-D SYRINGE/NEEDLE 1CC/25GX5/8) 25G X 5/8" 1 ML MISC As directed 100 each 5  . zolpidem (AMBIEN) 10 MG tablet Take 1 tablet by mouth at bedtime. 90 tablet 1   No current facility-administered medications  for this visit.     Allergies  Allergen Reactions  . Niaspan [Niacin] Itching  . Ofev [Nintedanib] Other (See Comments)    Lethargic, wiped out  . Pirfenidone     Other reaction(s): Other (See Comments)  . Ramipril Other (See Comments)    Cough  Other reaction(s): Other (See Comments) Cough    Past Medical History:  Diagnosis Date  . Acute diastolic heart failure ([place])   . Atrial fibrillation ([place])   . Atrial tachycardia ([place])   . CAD (coronary artery disease)   . COPD (chronic obstructive pulmonary disease) ([place])   . Hyperlipidemia   . Hypertension   . Hypothyroidism   . On home oxygen therapy    "3L; 24/7" (04/15/2015)  . Pneumothorax on right 4/11  . Postinflammatory pulmonary fibrosis ([place])   . Sleep apnea    "won't use CPAP" (04/14/2105)  . SVT (supraventricular tachycardia) ([place])    Per Dr. Tanna Furry note  . Type II diabetes mellitus ([place])   . WPW (Wolff-Parkinson-White syndrome)    Per Dr. Tanna Furry Note    Social History   Socioeconomic History  . Marital status: Single    Spouse name: Not on file  . Number of children: 1  . Years of education: Not on file  . Highest education level: Not on file  Occupational History    Employer: [employer]  . Financial resource strain: Not on file  . Food insecurity:    Worry: Not on file    Inability: Not on file  . Transportation needs:    Medical: Not on file    Non-medical: Not on file  Tobacco Use  . Smoking status: Former Smoker    Packs/day: 1.50    Years: 35.00    Pack years: 52.50    Types: Cigarettes    Last attempt to quit: 10/24/2007    Years since quitting: 10.7  . Smokeless tobacco: Never Used  Substance and Sexual Activity  . Alcohol use: Yes    Alcohol/week: 0.0 standard drinks    Comment: 04/15/2015 "I've had a couple drinks in the last 8 months"  . Drug use: No  . Sexual activity: Not Currently  Lifestyle  . Physical activity:    Days per week: Not on file    Minutes  per session: Not on file  . Stress: Not on file  Relationships  . Social connections:    Talks on phone: Not on file    Gets together: Not on file    Attends religious service: Not on file    Active member of club or organization: Not on file    Attends  meetings of clubs or organizations: Not on file    Relationship status: Not on file  . Intimate partner violence:    Fear of current or ex partner: Not on file    Emotionally abused: Not on file    Physically abused: Not on file    Forced sexual activity: Not on file  Other Topics Concern  . Not on file  Social History Narrative  . Not on file     Family History  Problem Relation Age of Onset  . Breast cancer Mother   . Diabetes type II Mother   . Lung cancer Father      Review of Systems: General: negative for chills, fever, night sweats or weight changes.  Cardiovascular: negative for chest pain, dyspnea on exertion, edema, orthopnea, palpitations, paroxysmal nocturnal dyspnea or shortness of breath Dermatological: negative for rash Respiratory: negative for cough or wheezing Urologic: negative for hematuria Abdominal: negative for nausea, vomiting, diarrhea, bright red blood per rectum, melena, or hematemesis Neurologic: negative for visual changes, syncope, or dizziness All other systems reviewed and are otherwise negative except as noted above.    Blood pressure 126/66, pulse 84, height 6' 1"  (1.854 m), weight 252 lb (114.3 kg), SpO2 96 %.  General appearance: alert, cooperative, no distress and on O2 Neck: no carotid bruit and no JVD Lungs: faint crackles Heart: regular rate and rhythm and decreased heart sounds Extremities: no edema Skin: Warm and dry Neurologic: Grossly normal  EKG NSR- HR 84, one PVC Septal Qs  ASSESSMENT AND PLAN:   Atrial fibrillation with RVR Recent onset though this has been noted in 2016 as well. He is now on increased Toprol and Eliquis-off Plavix  Pulmonary fibrosis Followed  by [facility], he is on chronic O2 and is currently taking an experimental drug  Insulin dependent diabetes mellitus (HCC) Type 2 IDDM  Essential hypertension Controlled  WPW syndrome S/P RFA-Dr Lovena Le follows  Chronic anticoagulation CHADS VASC=4, he is on Eliquis   PLAN  Same Rx- f/u with Dr Claiborne Billings 3 months.   Kerin Ransom PA-C 07/24/2018 1:35 PM

## 2018-07-24 NOTE — Assessment & Plan Note (Signed)
Type 2 IDDM 

## 2018-08-01 ENCOUNTER — Telehealth: Payer: Self-pay | Admitting: Internal Medicine

## 2018-08-01 NOTE — Telephone Encounter (Signed)
Placed a call today to Jerry Montgomery to follow up with patient regarding his recent ED visit. I wanted to get an update on the status of his complaints from his visit for Afib. I asked the patient for more clarification regarding the documented abdominal pain from that 19SEP2019 visit.He adamantly denies any abdominal pain during ED stay. he said the extent of his pain as he would describe it would be discomfort all in the left side to chest area. I repeatedly asked and stated there was documentation that some pain could have originated from the abdominal region and he said no, never there. just in his chest and side. and all has resolved since that time and that he was feeling fine. I reminded patient of his upcoming study visit on Wednesday October 16th at 0800 and the patient verbalized understanding. I thanked him for his time and ended the conversation.   Malena Edman.

## 2018-08-01 NOTE — Telephone Encounter (Signed)
Thanks and noted. abd pain will not be an AE     SIGNATURE    Dr. Kalman Shan, M.D., F.C.C.P, ACRP-CPI Pulmonary and Critical Care Medicine Research Investigator, [facility] @ [facility] Health Staff Physician, [facility] Center Director - Interstitial Lung Disease  Program  Pulmonary Fibrosis [facility] [place] Pulmonary and Pulmon[facility] @ [facility] [place], [place], [address]  Pager: [phone], If no answer or between  15:00h - 7:00h: call [phone] Telephone: [phone]  5:26 PM 08/01/2018  .08/01/2018

## 2018-08-07 ENCOUNTER — Telehealth: Payer: Self-pay | Admitting: Internal Medicine

## 2018-08-07 ENCOUNTER — Encounter: Payer: PPO | Admitting: *Deleted

## 2018-08-07 ENCOUNTER — Observation Stay (HOSPITAL_COMMUNITY)
Admission: RE | Admit: 2018-08-07 | Discharge: 2018-08-09 | Disposition: A | Discharge status: Home / Self Care | Payer: PPO | Source: Ambulatory Visit | Attending: Cardiovascular Disease | Admitting: Cardiovascular Disease

## 2018-08-07 DIAGNOSIS — J449 Chronic obstructive pulmonary disease, unspecified: Secondary | ICD-10-CM | POA: Diagnosis not present

## 2018-08-07 DIAGNOSIS — Z006 Encounter for examination for normal comparison and control in clinical research program: Secondary | ICD-10-CM

## 2018-08-07 DIAGNOSIS — I4891 Unspecified atrial fibrillation: Secondary | ICD-10-CM | POA: Diagnosis not present

## 2018-08-07 DIAGNOSIS — I2510 Atherosclerotic heart disease of native coronary artery without angina pectoris: Secondary | ICD-10-CM | POA: Insufficient documentation

## 2018-08-07 DIAGNOSIS — Z794 Long term (current) use of insulin: Secondary | ICD-10-CM | POA: Insufficient documentation

## 2018-08-07 DIAGNOSIS — Z7982 Long term (current) use of aspirin: Secondary | ICD-10-CM | POA: Diagnosis not present

## 2018-08-07 DIAGNOSIS — E039 Hypothyroidism, unspecified: Secondary | ICD-10-CM | POA: Insufficient documentation

## 2018-08-07 DIAGNOSIS — I4892 Unspecified atrial flutter: Secondary | ICD-10-CM | POA: Diagnosis not present

## 2018-08-07 DIAGNOSIS — E119 Type 2 diabetes mellitus without complications: Secondary | ICD-10-CM | POA: Diagnosis not present

## 2018-08-07 DIAGNOSIS — Z7901 Long term (current) use of anticoagulants: Secondary | ICD-10-CM | POA: Insufficient documentation

## 2018-08-07 DIAGNOSIS — Z87891 Personal history of nicotine dependence: Secondary | ICD-10-CM | POA: Insufficient documentation

## 2018-08-07 DIAGNOSIS — Z79899 Other long term (current) drug therapy: Secondary | ICD-10-CM | POA: Insufficient documentation

## 2018-08-07 DIAGNOSIS — I10 Essential (primary) hypertension: Secondary | ICD-10-CM | POA: Diagnosis not present

## 2018-08-07 DIAGNOSIS — J84112 Idiopathic pulmonary fibrosis: Secondary | ICD-10-CM

## 2018-08-07 DIAGNOSIS — J8410 Pulmonary fibrosis, unspecified: Secondary | ICD-10-CM | POA: Diagnosis not present

## 2018-08-07 DIAGNOSIS — I484 Atypical atrial flutter: Secondary | ICD-10-CM | POA: Diagnosis present

## 2018-08-07 LAB — BASIC METABOLIC PANEL
ANION GAP: 11 (ref 5–15)
BUN: 18 mg/dL (ref 8–23)
CALCIUM: 9 mg/dL (ref 8.9–10.3)
CHLORIDE: 108 mmol/L (ref 98–111)
CO2: 19 mmol/L — AB (ref 22–32)
Creatinine, Ser: 1.57 mg/dL — ABNORMAL HIGH (ref 0.61–1.24)
GFR calc Af Amer: 52 mL/min — ABNORMAL LOW (ref >60)
GFR calc non Af Amer: 45 mL/min — ABNORMAL LOW (ref >60)
GLUCOSE: 274 mg/dL — AB (ref 70–99)
POTASSIUM: 4.4 mmol/L (ref 3.5–5.1)
Sodium: 138 mmol/L (ref 135–145)

## 2018-08-07 LAB — CBC
HEMATOCRIT: 47.8 % (ref 39.0–52.0)
HEMOGLOBIN: 15.6 g/dL (ref 13.0–17.0)
MCH: 29.3 pg (ref 26.0–34.0)
MCHC: 32.6 g/dL (ref 30.0–36.0)
MCV: 89.7 fL (ref 80.0–100.0)
PLATELETS: 198 10*3/uL (ref 150–400)
RBC: 5.33 MIL/uL (ref 4.22–5.81)
RDW: 12.8 % (ref 11.5–15.5)
WBC: 8.5 10*3/uL (ref 4.0–10.5)
nRBC: 0 % (ref 0.0–0.2)

## 2018-08-07 LAB — GLUCOSE, CAPILLARY
Glucose-Capillary: 263 mg/dL — ABNORMAL HIGH (ref 70–99)
Glucose-Capillary: 214 mg/dL — ABNORMAL HIGH (ref 70–99)
Glucose-Capillary: 135 mg/dL — ABNORMAL HIGH (ref 70–99)

## 2018-08-07 MED ORDER — ATORVASTATIN CALCIUM 40 MG PO TABS
40.0000 mg | ORAL_TABLET | Freq: Every day | ORAL | Status: DC
  Administered 2018-08-07 – 2018-08-08 (×2): 40 mg via ORAL
  Filled 2018-08-07 (×2): qty 1

## 2018-08-07 MED ORDER — METFORMIN HCL 850 MG PO TABS
850.0000 mg | ORAL_TABLET | Freq: Two times a day (BID) | ORAL | Status: DC
  Administered 2018-08-07 – 2018-08-09 (×3): 850 mg via ORAL
  Filled 2018-08-07 (×5): qty 1

## 2018-08-07 MED ORDER — ISOSORBIDE MONONITRATE ER 60 MG PO TB24
60.0000 mg | ORAL_TABLET | Freq: Every day | ORAL | Status: DC
  Administered 2018-08-08 – 2018-08-09 (×2): 60 mg via ORAL
  Filled 2018-08-07 (×2): qty 1

## 2018-08-07 MED ORDER — ALBUTEROL SULFATE (2.5 MG/3ML) 0.083% IN NEBU: 3.0000 mL | INHALATION_SOLUTION | Freq: Four times a day (QID) | RESPIRATORY_TRACT | Status: DC | PRN

## 2018-08-07 MED ORDER — METOPROLOL TARTRATE 25 MG PO TABS
25.0000 mg | ORAL_TABLET | Freq: Once | ORAL | Status: AC
  Administered 2018-08-07: 25 mg via ORAL
  Filled 2018-08-07: qty 1

## 2018-08-07 MED ORDER — APIXABAN 5 MG PO TABS
5.0000 mg | ORAL_TABLET | Freq: Two times a day (BID) | ORAL | Status: DC
  Administered 2018-08-07 – 2018-08-09 (×4): 5 mg via ORAL
  Filled 2018-08-07 (×4): qty 1

## 2018-08-07 MED ORDER — LEVOTHYROXINE SODIUM 125 MCG PO TABS
125.0000 ug | ORAL_TABLET | Freq: Every day | ORAL | Status: DC
  Administered 2018-08-08 – 2018-08-09 (×2): 125 ug via ORAL
  Filled 2018-08-07 (×2): qty 1

## 2018-08-07 MED ORDER — ZOLPIDEM TARTRATE 5 MG PO TABS
10.0000 mg | ORAL_TABLET | Freq: Every day | ORAL | Status: DC
  Administered 2018-08-07 – 2018-08-08 (×2): 10 mg via ORAL
  Filled 2018-08-07 (×2): qty 2

## 2018-08-07 MED ORDER — ASPIRIN 81 MG PO CHEW
81.0000 mg | CHEWABLE_TABLET | Freq: Every day | ORAL | Status: DC
  Administered 2018-08-08 – 2018-08-09 (×2): 81 mg via ORAL
  Filled 2018-08-07 (×2): qty 1

## 2018-08-07 MED ORDER — FUROSEMIDE 20 MG PO TABS: 20.0000 mg | ORAL_TABLET | Freq: Every day | ORAL | Status: DC | PRN

## 2018-08-07 MED ORDER — INSULIN GLARGINE 100 UNIT/ML ~~LOC~~ SOLN
50.0000 [IU] | Freq: Every day | SUBCUTANEOUS | Status: DC
  Administered 2018-08-07 – 2018-08-08 (×2): 50 [IU] via SUBCUTANEOUS
  Filled 2018-08-07 (×3): qty 0.5

## 2018-08-07 MED ORDER — STUDY - FIBROGEN - PAMREVLUMAB OR PLACEBO 10 MG/ML IV INFUSION (PI-RAMASWAMY)
30.0000 mg/kg | Freq: Once | INTRAVENOUS | Status: DC
  Filled 2018-08-07: qty 350

## 2018-08-07 MED ORDER — INSULIN ASPART 100 UNIT/ML ~~LOC~~ SOLN
0.0000 [IU] | Freq: Three times a day (TID) | SUBCUTANEOUS | Status: DC
  Administered 2018-08-07: 8 [IU] via SUBCUTANEOUS
  Administered 2018-08-07: 5 [IU] via SUBCUTANEOUS
  Administered 2018-08-08: 3 [IU] via SUBCUTANEOUS

## 2018-08-07 MED ORDER — NITROGLYCERIN 0.4 MG SL SUBL: 0.4000 mg | SUBLINGUAL_TABLET | SUBLINGUAL | Status: DC | PRN

## 2018-08-07 MED ORDER — INSULIN ASPART 100 UNIT/ML ~~LOC~~ SOLN: 0.0000 [IU] | Freq: Every day | SUBCUTANEOUS | Status: DC

## 2018-08-07 MED ORDER — METOPROLOL SUCCINATE ER 100 MG PO TB24
100.0000 mg | ORAL_TABLET | Freq: Every day | ORAL | Status: DC
  Administered 2018-08-08: 100 mg via ORAL
  Filled 2018-08-07: qty 1

## 2018-08-07 MED ORDER — METOPROLOL TARTRATE 50 MG PO TABS
50.0000 mg | ORAL_TABLET | Freq: Once | ORAL | Status: AC
  Administered 2018-08-07: 50 mg via ORAL
  Filled 2018-08-07: qty 1

## 2018-08-07 MED ORDER — INSULIN ASPART 100 UNIT/ML ~~LOC~~ SOLN
6.0000 [IU] | Freq: Three times a day (TID) | SUBCUTANEOUS | Status: DC
  Administered 2018-08-07 – 2018-08-08 (×4): 6 [IU] via SUBCUTANEOUS

## 2018-08-07 NOTE — Research (Signed)
Clinical Research Coordinator / Research RN note : This visit for [name] with DOB: September 09, 1956 on 08/07/2018 for the above protocol is Visit/Encounter #30 month follow up  and is for purpose of research . The consent for this encounter is under Protocol Version date November 23, 2015 and  IS currently IRB approved. Subject expressed continued interest and consent in continuing as a study subject. Subject confirmed that there was  NO change in contact information (e.g. address, telephone, email). Subject thanked for participation in research and contribution to science.   In this visit 08/07/2018 the subject was seen By the study coordinator to have his follow up blood sample collection and Patient reported outcomes questionnaires completed. The subject completed  All study related procedures as  Per the above stated protocol. The subject was thanked for his participation as was instructed that his next follow up visit would be scheduled at a later date. Patient verbalized understanding. Next follow up to be in approximately 6 months.  Signed by  T. [name] BS, [facility]  Clinical Research Coordinator I [place], [place] 12:40pm 08/07/2018

## 2018-08-07 NOTE — Progress Notes (Signed)
pts hr sustained in the 120-130s, Bp 146/95 and currently asymptomatic. MD paged, orders received and implemented. Will continue to monitor pt.

## 2018-08-07 NOTE — Research (Signed)
Title: FGCL-3019-091 (FibroGen Study) is a Phase 3, randomized, double-blind, placebo-controlled multicenter international study to evaluate evaluate the efficacy and safety of 30 mg/kg IV infusions of pamrevlumab administered every 3 weeks for 52 weeks as compared to placebo in subjects with Idiopathic Pulmonary Fibrosis. Primary end point is: change in FVC from baseline at week 52  Protocol #: [id number], Clinical Trials #: [id number] Sponsor: [email or web address]  [facility] [place], [place], [place])  Protocol Version for 08/07/2018 date of Version 1.0 [id number] Consent Version for 08/07/2018 date of verson 2.1 [id number] Investigator Brochure Version for 08/07/2018 date of V17 [id number]     Mechanism of Action: Pamrevlumab is a human recombinant IgG monoclonal antibody that binds to connective tissue growth factor (CTGF).  CTGF plays a role by mediating the process of fibrosis. By binding to CTGF, pamrevlumab blocks its biologic activity; thereby preventing cell proliferation, adhesion and migration of growth factors involved in fibrotic changes. It is also being studied in other conditions where fibrotic changes play a role; liver fibrosis, Duchenne muscular dystrophy and certain cancers.   Half-life: 58-141 hours; t1/2 increases with increasing doses   Interactions No known drug interactions. All concomitant medications to be reviewed by PI.     Safety Data Amendment 1.0. 06 January 2018 624 subjects have been exposed to pamrevlumab, 270 with IPF The most common TEAEs in all subjects with IPF: Cough, fatigue, dyspnea, upper respiratory tract infection, bronchitis, nasopharyngitis No known effect on qtc prolongation, renal or hepatic issues   Phase 1 study Study [id number]-[id number], n=21 Enrolled 21 subjects with IPF No dose-limiting toxicities All adverse events were considered mild to moderate 76% of subjects experienced at least 1 TEAE The most common TEAEs: Pyrexia (n=3, 14% of  subjects) Cough (n=3, 14% of subjects) Dyspnea (n=3, 14% of subjects) Respiratory tract infection (n=2, 9% of subjects)   Phase 2 study Study [id number], n=90 Enrolled 90 subjects with IPF 14 deaths occurred, 13 were deemed to be related to IPF 20% of subjects experienced a TEAE that led to study drug discontinuation IPF and respiratory failure were the two most common reasons for discontinuation; occurring in 8% and 3% of patients, respectively The most common TEAEs: Cough (n=34, 38% of subjects) Dyspnea (n=24, 27% of subjects) Fatigue (n=24, 27% of subjects) Nasopharyngitis (n=20, 22% of subjects) Respiratory tract infection (n=19, 21% of subjects) Bronchitis (n=18, 20% of subjects)   Phase 2 study Study [id number], n=103 103 subjects enrolled with IPF 9 deaths occurred; 4 deemed related to IPF, 5 related to other respiratory causes 18% of subjects experienced a TEAE that led to study drug discontinuation The most common TEAEs: Cough (n=48, 47% of subjects) Respiratory tract infection (n=39, 38% of subjects) IPF (n=32, 31% of subjects) Dysnpea (n=30, 29% of subjects) Sinusitis (n=21, 21% of subjects) Fatigue (n=20, 19% of subjects)   Overall, pamrevlumab has been well tolerated. Infusion-related reactions have been reported at a rate that is consistent with other human monoclonal antibodies. Based on the mechanism of action of pamrevlumab, by inhibiting CTGF, there was some concern that this would cause impaired wound healing or impaired bone fracture healing. However, there were no serious adverse events reported in any study relating to these two issues.    Clinical Research Coordinator / Research RN note : This visit for [name] with DOB: [birth year] on 08/07/2018 for the above protocol is Visit/Encounter # week 9 and is for purpose of research .Subject expressed continued interest and consent in continuing as a study subject. Subject confirmed that there  was   No change in contact information (e.g. address, telephone, email). Subject thanked for participation in research and contribution to science.   In this visit 08/07/2018 the subject via video conference with the PI was  evaluated by Dr. [name]. The subject presented to clinic appearing to be in no distress. Subject stated he felt great and reported not feeling any concerns. And felt he was back to his normal baseline. However during pre dosing assessments patient was tachycardic with HR of 137. Patient referred to his smartphone heart monitoring app at which time showed possible afib. PI was consulted and the PI paged cards Dr. Erlene Quan for a bedside assessment. It was the recommendation of the cardiologist to admit the subject into the hospital for 24 hour observation and Rate control. Since the cards assessment was completed post start of infusion it was determined by PI that since event was not felt to be related to study drug that infusion can continue as planned for the 2hr duration and patient would be closely monitored throughout the infusion and 1 hr post infusion observation period. Through the duration of the subject visit. The subject remained asymptomatic and verbally assured the treatment team that he felt fine and did not feel any distress. Periodic interval vital checks were done throughout the infusion and subjects BP remained consistant with rates in the 90s/60s range and HR remained consistant in the 130s to 140s range. After the completion of the study observation hour the subject was discharged from outpatient day stay at 13:44 and was transported into the [facility] [place] to begin his observation and rate control inpatient encounter. For further documentation  Pertaining to todays visit, please refer to the subjects paper source binder. Additional details to follow regarding documentation of hospital admission being reported as an SAE.  At this time point subject is scheduled to return  for next treatment visit in 3 weeks.   Signed by  T. [name] BS, [facility] Clinical Research Coordinator I [place], [place] 3:26 [place] 08/07/2018

## 2018-08-07 NOTE — Telephone Encounter (Signed)
D/w CRC over phone  - infusion started > 15 miin ago. Tolerating fine. Cards consult arrived and over phoned/w Dr Allyson Sabal bedside cardiologist  - ok to continue infusion from his perspective . However, they are admitting Jerry Montgomery to hospital for rate control. We will report this as SAE. INstructed CRC Mr Excell Seltzer to continue with infusion with close monitoring    SIGNATURE    Dr. Kalman Shan, M.D., F.C.C.P, ACRP-CPI Pulmonary and Critical Care Medicine Research Investigator, [facility] @ [facility] Health Staff Physician, [facility] Center Director - Interstitial Lung Disease  Program  Pulmonary Fibrosis [facility] [place] Pulmonary and [facility] @ [facility] [place], [place], [address]  Pager: [phone], If no answer or between  15:00h - 7:00h: call [phone] Telephone: [phone]  11:12 AM 08/07/2018

## 2018-08-07 NOTE — H&P (Addendum)
Cardiology Admission History and Physical:   Patient ID: Jerry Montgomery MRN: [id number]; DOB: Jul 01, 1956   Admission date: 08/07/2018  Primary Care Provider: [name], MD Primary Cardiologist: Shelva Majestic, MD  Primary Electrophysiologist:  None Seen by Dr. Lovena Le in the past  Chief Complaint: Weakness  Patient Profile:   Jerry Montgomery is a 62 y.o. male with Hx of atrial fibrillation diagnosed 07/09/18, placed on Eliquis by his PCP, CADs/pPCI of proximal LAD and proximal circumflex(2004,new CFX in 2008, ISR CFX 2011),hyperlipidemia, hypertension, DM 2, diastolic VQM(0867), history of WPW s/p RFA followed by Dr. Doylene Canard COPD and pulmonary fibrosis on home O2 followed by Dr. Chase Caller, who was noted to have Atrial flutter with RVR in Short stay infusion center.  History of Present Illness:   Jerry Montgomery is participating in an investigational study for  IPF. He has reported to his PCP that since. Beginning the infusions, he has noted palpitations and rapid heart rate. He has a smart phone app that had alerted him for possible atrial fibrillation. On 9/17 he saw his PCP, was  noted to have A. Fib with rate 110s-120s. He was started on Eliquis (Plavix was discontinued), Metoprolol increased from 160m Daily to 2036mDaily, and referred to cardiology (TSH previously checked and WNL). He saw cardiology on 10/2 who maintained him on Metoprolol 10065maily with additional 51m56mr increased HR > 130. He was in short stay infusion center today for IPF investigational study and prior to infusion was reporting palpitations and was shown to be in A-flutter with RVR of 136. Patient states he is feeling tired due to this and he feel more tired than he did yesterday. He denies chest pain or significant shortness of breath (on home oxygen).   Past Medical History:  Diagnosis Date  . Acute diastolic heart failure (HCC)[place]. Atrial fibrillation (HCC)[place]. Atrial tachycardia (HCC)[place]. CAD  (coronary artery disease)   . COPD (chronic obstructive pulmonary disease) (HCC)[place]. Hyperlipidemia   . Hypertension   . Hypothyroidism   . On home oxygen therapy    "3L; 24/7" (04/15/2015)  . Pneumothorax on right 4/11  . 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 Sleep apnea    "won't use CPAP" (04/14/2105)  . SVT (supraventricular tachycardia) (HCC)[place] Per Dr. TaylTanna Furrye  . Type II diabetes mellitus (HCC)[place]. WPW (Wolff-Parkinson-White syndrome)    Per Dr. TaylTanna Furrye    Past Surgical History:  Procedure Laterality Date  . BILATERAL VATS ABLATION Right   . CARDIAC CATHETERIZATION  05/03/2007   patent stents  . CARDIAC CATHETERIZATION N/A 05/18/2015   Procedure: Right Heart Cath;  Surgeon: HenrBelva Crome;  [facility];  Service: Cardiovascular;  Laterality: N/A;  . CORONARY ANGIOPLASTY WITH STENT PLACEMENT  03/05/2003; 2008; 01/13/2010   PCI & Stent  LAD & mid CX; stent to CX  (I've got 4 stents total" (04/15/2015)  . ELECTROPHYSIOLOGIC STUDY N/A 04/14/2015   Procedure: SVT Ablation;  Surgeon: GregEvans Lance;  [facility];  Service: Cardiovascular;  Laterality: N/A;  . KNEE ARTHROSCOPY Right 01/22/2013   Procedure: RIGHT ARTHROSCOPY KNEE WITH DEBRIDEMENT, abrasion chondroplasty of lateral tibial plateau, debridement of partial tear of ACL, menisectomy;  Surgeon: RonaTobi Bastos;  Location: [facility];  Service: Orthopedics;  Laterality: Right;  . KNEE SURGERY  2012   "reattached quad"  . LUNG LOBECTOMY  Right   . SHOULDER ARTHROSCOPY W/ ROTATOR CUFF REPAIR Bilateral   . VIDEO ASSISTED THORACOSCOPY Left 07/19/2015   Procedure: LEFT VIDEO ASSISTED THORACOSCOPY WITH LEFT UPPER AND LOWER LUNG BIOPSY;  Surgeon: Melrose Nakayama, MD;  Location: [place];  Service: Thoracic;  Laterality: Left;  Marland Kitchen VIDEO BRONCHOSCOPY N/A 07/19/2015   Procedure: VIDEO BRONCHOSCOPY WITH FLUORO;  Surgeon: Melrose Nakayama, MD;  Location: [facility] OR;  Service: Thoracic;   Laterality: N/A;     Medications Prior to Admission: Prior to Admission medications   Medication Sig Start Date End Date Taking? Authorizing Provider  ALPRAZolam Duanne Moron) 0.5 MG tablet Take 1 tablet (0.5 mg total) by mouth at bedtime as needed for anxiety. 07/27/17  Yes [name], MD  apixaban (ELIQUIS) 5 MG TABS tablet Take 5 mg by mouth 2 (two) times daily.   Yes [provider]  aspirin 81 MG tablet Take 81 mg by mouth every morning.    Yes [provider]  atorvastatin (LIPITOR) 40 MG tablet Take 1 tablet by mouth every night at bedtime 06/07/18  Yes [name], MD  blood glucose meter kit and supplies KIT Dispense based on patient and insurance preference. Use up to four times daily as directed. (FOR ICD E11.65) 11/24/15  Yes [name], MD  Blood Glucose Monitoring Suppl (ONE TOUCH ULTRA 2) w/Device KIT Use to monitor FSBS 4x daily for fluctuating blood sugars. Dx: E11.65. 02/26/17  Yes [name], MD  Blood Glucose Monitoring Suppl (ONE TOUCH ULTRA SYSTEM KIT) w/Device KIT Use to monitor FSBS 4x daily for fluctuating blood sugars. Dx: E11.65. 01/04/16  Yes Harwick, Modena Nunnery, MD  dapagliflozin propanediol (FARXIGA) 10 MG TABS tablet Take 10 mg by mouth daily. 02/07/18  Yes [name], MD  furosemide (LASIX) 20 MG tablet Take 1 tablet (20 mg total) by mouth daily as needed for fluid (fluid). 07/08/18  Yes [name], MD  insulin aspart (NOVOLOG) 100 UNIT/ML injection 15 UNITS WITH BREAKFAST AND 15 UNITS WITH LUNCH, 20 UNITS WITH SUPPER 02/07/18  Yes [name], MD  insulin glargine (LANTUS) 100 UNIT/ML injection Inject 0.6 mLs (60 Units total) into the skin at bedtime. 09/11/17  Yes [name], MD  isosorbide mononitrate (IMDUR) 60 MG 24 hr tablet Take 60 mg by mouth every day 11/09/17  Yes [name], MD  levothyroxine (SYNTHROID, LEVOTHROID) 125 MCG tablet Take 1 tablet (125 mcg total) by mouth daily. 07/08/18  Yes  [name], MD  Menthol, Topical Analgesic, (BIOFREEZE EX) Apply 1 application topically as needed (for pain).   Yes [provider]  metFORMIN (GLUCOPHAGE) 850 MG tablet Take 1 tablet by mouth twice a day 04/08/18  Yes [name], MD  metoprolol succinate (TOPROL-XL) 100 MG 24 hr tablet Take 1 tablet (100 mg total) by mouth daily. Take with or immediately following a meal. 11/09/17  Yes Pickard, Cammie Mcgee, MD  nitroGLYCERIN (NITROSTAT) 0.4 MG SL tablet Place 1 tablet (0.4 mg total) under the tongue every 5 (five) minutes as needed for chest pain. 03/11/15  Yes Evans Lance, MD  omega-3 acid ethyl esters (LOVAZA) 1 g capsule Take 2 capsules (2 g total) by mouth 2 (two) times daily. 03/07/18  Yes Troy Sine, MD  Omega-3 Fatty Acids (FISH OIL PO) Take 2 capsules by mouth 2 (two) times daily.   Yes [provider]  ONE TOUCH ULTRA TEST test strip Use to monitor FSBS 4x  daily for fluctuating blood sugars. Dx: E11.65. 11/09/17  Yes [name], MD  [facility] DELICA LANCETS FINE MISC Test up to 4 times a day as directed. 11/10/16  Yes [name], MD  SYRINGE/NEEDLE, DISP, 1 ML (B-D SYRINGE/NEEDLE 1CC/25GX5/8) 25G X 5/8" 1 ML MISC As directed 07/27/17  Yes [name], MD  zolpidem (AMBIEN) 10 MG tablet Take 1 tablet by mouth at bedtime. 04/08/18  Yes [name], MD  albuterol (PROVENTIL HFA;VENTOLIN HFA) 108 (90 Base) MCG/ACT inhaler Inhale 2 puffs into the lungs every 6 (six) hours as needed for wheezing. 03/07/16   [name], MD     Allergies:    Allergies  Allergen Reactions  . Niaspan [Niacin] Itching  . Ofev [Nintedanib] Other (See Comments)    Lethargic, wiped out  . Pirfenidone     Other reaction(s): Other (See Comments)  . Ramipril Other (See Comments)    Cough  Other reaction(s): Other (See Comments) Cough    Social History:   Social History   Socioeconomic History  . Marital status: Single    Spouse name: Not on file   . Number of children: 1  . Years of education: Not on file  . Highest education level: Not on file  Occupational History    Employer: [employer] Needs  . Financial resource strain: Not on file  . Food insecurity:    Worry: Not on file    Inability: Not on file  . Transportation needs:    Medical: Not on file    Non-medical: Not on file  Tobacco Use  . Smoking status: Former Smoker    Packs/day: 1.50    Years: 35.00    Pack years: 52.50    Types: Cigarettes    Last attempt to quit: 10/24/2007    Years since quitting: 10.7  . Smokeless tobacco: Never Used  Substance and Sexual Activity  . Alcohol use: Yes    Alcohol/week: 0.0 standard drinks    Comment: 04/15/2015 "I've had a couple drinks in the last 8 months"  . Drug use: No  . Sexual activity: Not Currently  Lifestyle  . Physical activity:    Days per week: Not on file    Minutes per session: Not on file  . Stress: Not on file  Relationships  . Social connections:    Talks on phone: Not on file    Gets together: Not on file    Attends religious service: Not on file    Active member of club or organization: Not on file    Attends meetings of clubs or organizations: Not on file    Relationship status: Not on file  . Intimate partner violence:    Fear of current or ex partner: Not on file    Emotionally abused: Not on file    Physically abused: Not on file    Forced sexual activity: Not on file  Other Topics Concern  . Not on file  Social History Narrative  . Not on file    Family History:  The patient's family history includes Breast cancer in his mother; Diabetes type II in his mother; Lung cancer in his father.    ROS:  Please see the history of present illness.   All other ROS reviewed and negative.     Physical Exam/Data:   Vitals:   08/07/18 0807  BP: 98/68  Pulse: 63  Resp: 20  Temp: 97.6 F (36.4 C)  TempSrc: Oral  SpO2: 98%  Weight: 114.3 kg  Height: _0  (1.854 m)   No  intake or output data in the 24 hours ending 08/07/18 1119 Filed Weights   08/07/18 0807  Weight: 114.3 kg   Body mass index is 33.25 kg/m.  General:  Well nourished, well developed, in mild distress HEENT: normal Lymph: no adenopathy Neck: no JVD Endocrine:  No thryomegaly Vascular: No carotid bruits; pulses 2+ bilaterally  Cardiac:  Irr Irr, Tachycardia, no murmur Lungs:  Good air movement; crackles consistent with known IPF  Abd: soft, nontender, no hepatomegaly  Ext: no edema Musculoskeletal:  No deformities, BUE and BLE strength normal and equal Skin: warm and dry  Psych:  Normal affect    EKG:  The ECG that was done was personally reviewed and demonstrates Atrial Flutter with 2 to 1 block, rate of 137  Relevant CV Studies:  Echo 07/18/2018 Study Conclusions - Left ventricle: The cavity size was normal. There was mild   concentric hypertrophy. Systolic function was mildly reduced. The   estimated ejection fraction was in the range of 45% to 50%.   Although no diagnostic regional wall motion abnormality was   identified, this possibility cannot be completely excluded on the   basis of this study. Features are consistent with a pseudonormal   left ventricular filling pattern, with concomitant abnormal   relaxation and increased filling pressure (grade 2 diastolic   dysfunction). - Mitral valve: Calcified annulus. Systolic bowing without   prolapse. - Left atrium: The atrium was mildly dilated. - Right ventricle: The cavity size was mildly dilated. Systolic   function was mildly reduced.  Laboratory Data:  ChemistryNo results for input(s): NA, K, CL, CO2, GLUCOSE, BUN, CREATININE, CALCIUM, GFRNONAA, GFRAA, ANIONGAP in the last 168 hours.  No results for input(s): PROT, ALBUMIN, AST, ALT, ALKPHOS, BILITOT in the last 168 hours. HematologyNo results for input(s): WBC, RBC, HGB, HCT, MCV, MCH, MCHC, RDW, PLT in the last 168 hours. Cardiac EnzymesNo results for input(s):  TROPONINI in the last 168 hours. No results for input(s): TROPIPOC in the last 168 hours.  BNPNo results for input(s): BNP, PROBNP in the last 168 hours.  DDimer No results for input(s): DDIMER in the last 168 hours.  Radiology/Studies:  No results found.  Assessment and Plan:   1) Atrial Flutter with RVR: Patient with Atrial Fib/Flutter diagnosed at PCP office last month (Sympotm onset appears to have been after starting infusion study for IPF); at which time his Plavix was switched to Eliquis, His Metoprolol dose was increased from 16m Daily to 1013mqAM and 5034mPM for sustained HR >130. He was seen by Cardiology on 10/2 who agreed with this plan. He was noted to have RVR to 137 in Infusion center today. Will admit to obtain rate control and EP evaluation. - Of note, patient does have history of WPW s/p RFA (followed by Dr. TayLovena Le- EP to see patient - Will continue Metoprolol 100m57mily and give additional 50mg46montinue Eliquis  CAD: s/pPCI of proximal LAD and proximal circumflex(2004,new CFX in 2008, ISR CFX 2011). - Continue home ASA, IMDUR, Metoprolol, Atorvastatin - Continue home Nitro PRN  HFrEF: Last echo with EF 45-50%, G2DD. - Continue home Metoprolol and IMDUR - Holding home lasix as this is PRN, will give if needed  IPF: Will complete current infusion, Continue home 6-8L Supplemental O2, PRN albuterol  HTN: BP soft continue home metoprolol only, holding PRN Lasix HLD: Continue home Atorvastatin Hypothyroidism: Continue home  synthroid DM: Continue Metformin; Lantus 50U qhs; Novolog 6U TID AC, SSI + qHS    Severity of Illness: The appropriate patient status for this patient is OBSERVATION. Observation status is judged to be reasonable and necessary in order to provide the required intensity of service to ensure the patient's safety. The patient's presenting symptoms, physical exam findings, and initial radiographic and laboratory data in the context of their  medical condition is felt to place them at decreased risk for further clinical deterioration. Furthermore, it is anticipated that the patient will be medically stable for discharge from the hospital within 2 midnights of admission. The following factors support the patient status of observation.   " The patient's presenting symptoms include weakness, tiredness, palpitations. " The physical exam findings include tachycardia. " The initial radiographic and laboratory data show atrial flutter with RVR.   For questions or updates, please contact [place] Please consult [email or web address] for contact info under    Signed, Neva Seat, MD  08/07/2018 11:19 AM   Agree with note by Dr. Trilby Drummer  Jerry Montgomery is a 62 year old moderately overweight patient of Dr. Lenise Arena and Selinda Michaels who we are asked to see in the outpatient infusion area for a flutter with RVR.  His history is well outlined above.  He has a history of CAD with remote intervention as well as PAF status post ablation 4 years ago by Dr. Lovena Le.  He has idiopathic primary fibrosis and is here for experimental infusion overseen by Dr. Chase Caller.  He does have a Kardia rhythm monitor which he uses as an outpatient` has shown intermittent PAF with RVR.  He is on Eliquis and variable doses of beta-blocker .  Today during in the infusion area his heart rate is 130 with 2-1 block.  He said he feels fatigued recently.  His exam is benign.  We will admit him for medication titration and will get the letter physiology service to evaluate.  Lorretta Harp, M.D., [place], [facility], [place] [address] [place], [place]  [address]  [phone] 08/07/2018 1:00 PM

## 2018-08-07 NOTE — Progress Notes (Signed)
Pt remains RVR with rate 130s 1.5 hours after 50 mg po metoprolol. Provider notified. Will continue to monitor.

## 2018-08-07 NOTE — Telephone Encounter (Signed)
Title: FGCL-3019-091 (FibroGen Study) is a Phase 3, randomized, double-blind, placebo-controlled multicenter international study to evaluate evaluate the efficacy and safety of 30 mg/kg IV infusions of pamrevlumab administered every 3 weeks for 52 weeks as compared to placebo in subjects with Idiopathic Pulmonary Fibrosis. Primary end point is: change in FVC from baseline at week 52  Protocol #: FGCL-3019-091, Clinical Trials #: [id number] Sponsor: [email or web address]  [facility] [place], [place], [place])   Research officer, trade union 1.0. 06 January 2018 624 subjects have been exposed to pamrevlumab, 270 with IPF The most common TEAEs in all subjects with IPF: Cough, fatigue, dyspnea, upper respiratory tract infection, bronchitis, nasopharyngitis No known effect on qtc prolongation, renal or hepatic issues   Phase 1 study Study [id number], n=21 Enrolled 21 subjects with IPF No dose-limiting toxicities All adverse events were considered mild to moderate 76% of subjects experienced at least 1 TEAE The most common TEAEs: Pyrexia (n=3, 14% of subjects) Cough (n=3, 14% of subjects) Dyspnea (n=3, 14% of subjects) Respiratory tract infection (n=2, 9% of subjects)   Phase 2 study Study [id number]-3019-049, n=90 Enrolled 90 subjects with IPF 14 deaths occurred, 13 were deemed to be related to IPF 20% of subjects experienced a TEAE that led to study drug discontinuation IPF and respiratory failure were the two most common reasons for discontinuation; occurring in 8% and 3% of patients, respectively The most common TEAEs: Cough (n=34, 38% of subjects) Dyspnea (n=24, 27% of subjects) Fatigue (n=24, 27% of subjects) Nasopharyngitis (n=20, 22% of subjects) Respiratory tract infection (n=19, 21% of subjects) Bronchitis (n=18, 20% of subjects)   Phase 2 study Study [id number]-3019-067, n=103 103 subjects enrolled with IPF 9 deaths occurred; 4 deemed related to IPF, 5 related to other respiratory causes 18% of subjects  experienced a TEAE that led to study drug discontinuation The most common TEAEs: Cough (n=48, 47% of subjects) Respiratory tract infection (n=39, 38% of subjects) IPF (n=32, 31% of subjects) Dysnpea (n=30, 29% of subjects) Sinusitis (n=21, 21% of subjects) Fatigue (n=20, 19% of subjects)   Overall, pamrevlumab has been well tolerated. Infusion-related reactions have been reported at a rate that is consistent with other human monoclonal antibodies. Based on the mechanism of action of pamrevlumab, by inhibiting CTGF, there was some concern that this would cause impaired wound healing or impaired bone fracture healing. However, there were no serious adverse events reported in any study relating to these two issues.    .............................................  This is a documentation of events this morning up until this point in time. [name] -presented for his morning infusion.  This is a 30 infusion on the study.  Week 9 visit.  I had a video conference with the subject at the time of arrival prior to the start of infusion.  He said he was asymptomatic other than his baseline issues related to IPF.Marland Kitchen  He said his home heart rate monitor was not reporting any atrial fibrillation.  However vital signs just prior to infusion showed a heart rate in the 130s.  We did an EKG and it was in atrial flutter witHR 130s.  His blood pressure was then monitored at my instruction over telephone.  The blood pressure was checked at least a couple of times.  His mean arterial pressure was in the 80s.  Systolic pressure was in the 90s.  But he was asymptomatic.  Pulse ox was normal.  Clinical research coronary reported patient being asymptomatic.  I then subsequently called Dr. Jens Som who was his treating cardiologist a few  weeks ago.  Dr. Jens Som reported that he had only seen the patient one time.  Patient's primary cardiologist Dr. Nicki Guadalajara.  Dr. Jens Som reviewed EKG and acknowledged atrial flutter.   Because of the tachycardia it was felt best to consult cardiology.  I subsequently called the cardiology consult service and acknowledged that they will have the patient on the list and review while patient was in the infusion center.  I then his principal investigator to be independent decision to start the infusion.  Based on our knowledge of the literature and discussion with Dr. Jens Som it appears that the atrial flutter/fibrillation is probably unrelated to investigational product.  I did not instruct the clinical research coordinator  -Given the infusion over the standard 2 hours instead of shortening at 1 hour -Check blood pressure every 10 minutes -Continuous pulse oximetry monitoring -Give parameters to call me immediately if there was an issue -Await cardiology consultation    SIGNATURE    Dr. Kalman Shan, M.D., F.C.C.P, ACRP-CPI Pulmonary and Critical Care Medicine Research Investigator, [facility] @ [facility] Health Staff Physician, [facility] Center Director - Interstitial Lung Disease  Program  Pulmonary Fibrosis [facility] [place] [facility] and [facility] @ [facility] [place], [place], [address]  Pager: [phone], If no answer or between  15:00h - 7:00h: call [phone] Telephone: [phone]  10:51 AM 08/07/2018

## 2018-08-08 DIAGNOSIS — I4891 Unspecified atrial fibrillation: Secondary | ICD-10-CM | POA: Diagnosis not present

## 2018-08-08 DIAGNOSIS — I4892 Unspecified atrial flutter: Secondary | ICD-10-CM | POA: Diagnosis not present

## 2018-08-08 DIAGNOSIS — I480 Paroxysmal atrial fibrillation: Secondary | ICD-10-CM

## 2018-08-08 LAB — GLUCOSE, CAPILLARY
GLUCOSE-CAPILLARY: 101 mg/dL — AB (ref 70–99)
Glucose-Capillary: 80 mg/dL (ref 70–99)
Glucose-Capillary: 151 mg/dL — ABNORMAL HIGH (ref 70–99)
Glucose-Capillary: 91 mg/dL (ref 70–99)

## 2018-08-08 LAB — HIV ANTIBODY (ROUTINE TESTING W REFLEX): HIV Screen 4th Generation wRfx: NONREACTIVE

## 2018-08-08 MED ORDER — METOPROLOL TARTRATE 50 MG PO TABS
50.0000 mg | ORAL_TABLET | Freq: Once | ORAL | Status: AC
  Administered 2018-08-08: 50 mg via ORAL
  Filled 2018-08-08: qty 1

## 2018-08-08 MED ORDER — ORAL CARE MOUTH RINSE
15.0000 mL | Freq: Two times a day (BID) | OROMUCOSAL | Status: DC
  Administered 2018-08-08 – 2018-08-09 (×2): 15 mL via OROMUCOSAL

## 2018-08-08 NOTE — Care Management Note (Signed)
Case Management Note  Patient Details  Name: CARRELL RAHMANI MRN: [id number] Date of Birth: 1955-10-28  Subjective/Objective: Pt presented for Atrial Fib. Patient is Observation Status- CM did speak with patient to make him aware of the Observation Status and the Medicare Rule. Patient became loud and belligerent- stating that if he has to pay for his medications he can sit at home and do this- he is already in the "donut hole" and medications will be more expensive.  CM did not get Observation letter signed. CM stated she was removing herself from the room and stated she will have RN to call MD to make them aware if he was ready to be discharged.                     Action/Plan: Staff RN is aware of what was stated. No further needs from CM at this time.   Expected Discharge Date:                  Expected Discharge Plan:  Home/Self Care  In-House Referral:  NA  Discharge planning Services  CM Consult  Post Acute Care Choice:  NA Choice offered to:  NA  DME Arranged:  N/A DME Agency:  NA  HH Arranged:  NA HH Agency:  NA  Status of Service:  Completed, signed off  If discussed at Long Length of Stay Meetings, dates discussed:    Additional Comments:  Gala Lewandowsky, RN 08/08/2018, 4:18 PM

## 2018-08-08 NOTE — Consult Note (Addendum)
ELECTROPHYSIOLOGY CONSULT NOTE    Patient ID: Jerry Montgomery MRN: [id number], DOB/AGE: 64-Jul-1957 64 y.o.  Admit date: 08/07/2018 Date of Consult: 08/08/2018  Primary Physician: [name], MD Primary Cardiologist: Claiborne Billings Electrophysiologist: Lovena Le    Patient Profile: Jerry Montgomery is a 62 y.o. male with a history of pulmonary fibrosis, paroxysmal atrial fibrillation, CAD, COPD, hypothyroidism, WPW s/p ablation who is being seen today for the evaluation of AF at the request of Dr Gwenlyn Found.  HPI:  Jerry Montgomery is a 62 y.o. male with the above past medical history. He was getting an infusion yesterday for his pulmonary fibrosis and was found to be in AF with RVR. He was seen by cardiology and admitted.  He is relatively asymptomatic with his AF. He has shortness of breath at baseline 2/2 pulmonary fibrosis.  He denies recent chest pain. No fevers or chills.  Echo 06/2018 demonstrated EF 25-36%, grade 2 diastolic dysfunction, LA 38.     Past Medical History:  Diagnosis Date  . Acute diastolic heart failure ([place])   . Atrial fibrillation ([place])   . Atrial tachycardia ([place])   . CAD (coronary artery disease)   . COPD (chronic obstructive pulmonary disease) ([place])   . Hyperlipidemia   . Hypertension   . Hypothyroidism   . On home oxygen therapy    "3L; 24/7" (04/15/2015)  . Pneumothorax on right 4/11  . Postinflammatory pulmonary fibrosis ([place])   . Sleep apnea    "won't use CPAP" (04/14/2105)  . SVT (supraventricular tachycardia) ([place])    Per Dr. Tanna Furry note  . Type II diabetes mellitus ([place])   . WPW (Wolff-Parkinson-White syndrome)    Per Dr. Tanna Furry Note     Surgical History:  Past Surgical History:  Procedure Laterality Date  . BILATERAL VATS ABLATION Right   . CARDIAC CATHETERIZATION  05/03/2007   patent stents  . CARDIAC CATHETERIZATION N/A 05/18/2015   Procedure: Right Heart Cath;  Surgeon: Belva Crome, MD;  Location: [facility];  Service:  Cardiovascular;  Laterality: N/A;  . CORONARY ANGIOPLASTY WITH STENT PLACEMENT  03/05/2003; 2008; 01/13/2010   PCI & Stent  LAD & mid CX; stent to CX  (I've got 4 stents total" (04/15/2015)  . ELECTROPHYSIOLOGIC STUDY N/A 04/14/2015   Procedure: SVT Ablation;  Surgeon: Evans Lance, MD;  Location: [facility];  Service: Cardiovascular;  Laterality: N/A;  . KNEE ARTHROSCOPY Right 01/22/2013   Procedure: RIGHT ARTHROSCOPY KNEE WITH DEBRIDEMENT, abrasion chondroplasty of lateral tibial plateau, debridement of partial tear of ACL, menisectomy;  Surgeon: Tobi Bastos, MD;  Location: [facility];  Service: Orthopedics;  Laterality: Right;  . KNEE SURGERY  2012   "reattached quad"  . LUNG LOBECTOMY Right   . SHOULDER ARTHROSCOPY W/ ROTATOR CUFF REPAIR Bilateral   . VIDEO ASSISTED THORACOSCOPY Left 07/19/2015   Procedure: LEFT VIDEO ASSISTED THORACOSCOPY WITH LEFT UPPER AND LOWER LUNG BIOPSY;  Surgeon: Melrose Nakayama, MD;  Location: [facility];  Service: Thoracic;  Laterality: Left;  Marland Kitchen VIDEO BRONCHOSCOPY N/A 07/19/2015   Procedure: VIDEO BRONCHOSCOPY WITH FLUORO;  Surgeon: Melrose Nakayama, MD;  Location: [facility];  Service: Thoracic;  Laterality: N/A;     Medications Prior to Admission  Medication Sig Dispense Refill Last Dose  . ALPRAZolam (XANAX) 0.5 MG tablet Take 1 tablet (0.5 mg total) by mouth at bedtime as needed for anxiety. 30 tablet 0 08/07/2018 at Unknown time  . apixaban (ELIQUIS) 5 MG TABS tablet  Take 5 mg by mouth 2 (two) times daily.   08/07/2018 at 0700  . aspirin 81 MG tablet Take 81 mg by mouth every morning.    08/07/2018 at Unknown time  . atorvastatin (LIPITOR) 40 MG tablet Take 1 tablet by mouth every night at bedtime 30 tablet 2 08/07/2018 at Unknown time  . dapagliflozin propanediol (FARXIGA) 10 MG TABS tablet Take 10 mg by mouth daily. 30 tablet 0 08/07/2018 at Unknown time  . furosemide (LASIX) 20 MG tablet Take 1 tablet (20 mg total) by mouth daily as needed for fluid  (fluid). (Patient taking differently: Take 20 mg by mouth daily. ) 90 tablet 2 08/07/2018 at Unknown time  . insulin aspart (NOVOLOG) 100 UNIT/ML injection 15 UNITS WITH BREAKFAST AND 15 UNITS WITH LUNCH, 20 UNITS WITH SUPPER (Patient taking differently: Inject 15-30 Units into the skin See admin instructions. 15 UNITS WITH BREAKFAST AND 15 UNITS WITH LUNCH, 30 UNITS WITH SUPPER) 60 mL 5 08/07/2018 at Unknown time  . insulin glargine (LANTUS) 100 UNIT/ML injection Inject 0.6 mLs (60 Units total) into the skin at bedtime. 60 mL 3 08/07/2018 at Unknown time  . isosorbide mononitrate (IMDUR) 60 MG 24 hr tablet Take 60 mg by mouth every day (Patient taking differently: Take 60 mg by mouth daily. ) 90 tablet 3 08/07/2018 at Unknown time  . levothyroxine (SYNTHROID, LEVOTHROID) 125 MCG tablet Take 1 tablet (125 mcg total) by mouth daily. 90 tablet 2 08/07/2018 at 0600  . Menthol, Topical Analgesic, (BIOFREEZE EX) Apply 1 application topically as needed (for pain).   Past Week at prn  . metFORMIN (GLUCOPHAGE) 850 MG tablet Take 1 tablet by mouth twice a day (Patient taking differently: Take 850 mg by mouth 2 (two) times daily with a meal. Take 1 tablet by mouth  twice a day) 180 tablet 2 08/07/2018 at Unknown time  . metoprolol succinate (TOPROL-XL) 100 MG 24 hr tablet Take 1 tablet (100 mg total) by mouth daily. Take with or immediately following a meal. 90 tablet 3 08/07/2018 at 0700  . nitroGLYCERIN (NITROSTAT) 0.4 MG SL tablet Place 1 tablet (0.4 mg total) under the tongue every 5 (five) minutes as needed for chest pain. 25 tablet 1 unknown at prn  . omega-3 acid ethyl esters (LOVAZA) 1 g capsule Take 2 capsules (2 g total) by mouth 2 (two) times daily. 360 capsule 3 08/07/2018 at Unknown time  . Omega-3 Fatty Acids (FISH OIL PO) Take 2 capsules by mouth 2 (two) times daily.   08/07/2018 at Unknown time  . OXYGEN Inhale 6-8 L into the lungs continuous.   08/07/2018 at Unknown time  . zolpidem (AMBIEN) 10 MG  tablet Take 1 tablet by mouth at bedtime. 90 tablet 1 08/07/2018 at Unknown time  . blood glucose meter kit and supplies KIT Dispense based on patient and insurance preference. Use up to four times daily as directed. (FOR ICD E11.65) 1 each 0   . Blood Glucose Monitoring Suppl (ONE TOUCH ULTRA 2) w/Device KIT Use to monitor FSBS 4x daily for fluctuating blood sugars. Dx: E11.65. 1 each 0   . Blood Glucose Monitoring Suppl (ONE TOUCH ULTRA SYSTEM KIT) w/Device KIT Use to monitor FSBS 4x daily for fluctuating blood sugars. Dx: E11.65. 1 each 1   . ONE TOUCH ULTRA TEST test strip Use to monitor FSBS 4x daily for fluctuating blood sugars. Dx: E11.65. 400 each 3   . ONETOUCH DELICA LANCETS FINE MISC Test up to 4 times a  day as directed. 100 each 5   . SYRINGE/NEEDLE, DISP, 1 ML (B-D SYRINGE/NEEDLE 1CC/25GX5/8) 25G X 5/8" 1 ML MISC As directed 100 each 5     Inpatient Medications:  . apixaban  5 mg Oral BID  . aspirin  81 mg Oral Daily  . atorvastatin  40 mg Oral q1800  . STUDY - FIBROGEN - pamrevlumab or placebo  30 mg/kg (Order-Specific) Intravenous Once  . insulin aspart  0-15 Units Subcutaneous TID WC  . insulin aspart  0-5 Units Subcutaneous QHS  . insulin aspart  6 Units Subcutaneous TID WC  . insulin glargine  50 Units Subcutaneous QHS  . isosorbide mononitrate  60 mg Oral Daily  . levothyroxine  125 mcg Oral QAC breakfast  . mouth rinse  15 mL Mouth Rinse BID  . metFORMIN  850 mg Oral BID WC  . metoprolol succinate  100 mg Oral Daily  . zolpidem  10 mg Oral QHS    Allergies:  Allergies  Allergen Reactions  . Niaspan [Niacin] Itching  . Ofev [Nintedanib] Other (See Comments)    Lethargic, wiped out  . Pirfenidone     Other reaction(s): Other (See Comments)  . Ramipril Other (See Comments)    Cough  Other reaction(s): Other (See Comments) Cough    Social History   Socioeconomic History  . Marital status: Single    Spouse name: Not on file  . Number of children: 1  .  Years of education: Not on file  . Highest education level: Not on file  Occupational History    Employer: [employer]  . Financial resource strain: Not on file  . Food insecurity:    Worry: Not on file    Inability: Not on file  . Transportation needs:    Medical: Not on file    Non-medical: Not on file  Tobacco Use  . Smoking status: Former Smoker    Packs/day: 1.50    Years: 35.00    Pack years: 52.50    Types: Cigarettes    Last attempt to quit: 10/24/2007    Years since quitting: 10.7  . Smokeless tobacco: Never Used  Substance and Sexual Activity  . Alcohol use: Yes    Alcohol/week: 0.0 standard drinks    Comment: 04/15/2015 "I've had a couple drinks in the last 8 months"  . Drug use: No  . Sexual activity: Not Currently  Lifestyle  . Physical activity:    Days per week: Not on file    Minutes per session: Not on file  . Stress: Not on file  Relationships  . Social connections:    Talks on phone: Not on file    Gets together: Not on file    Attends religious service: Not on file    Active member of club or organization: Not on file    Attends meetings of clubs or organizations: Not on file    Relationship status: Not on file  . Intimate partner violence:    Fear of current or ex partner: Not on file    Emotionally abused: Not on file    Physically abused: Not on file    Forced sexual activity: Not on file  Other Topics Concern  . Not on file  Social History Narrative  . Not on file     Family History  Problem Relation Age of Onset  . Breast cancer Mother   . Diabetes type II Mother   . Lung cancer Father  Review of Systems: All other systems reviewed and are otherwise negative except as noted above.  Physical Exam: Vitals:   08/07/18 2300 08/08/18 0110 08/08/18 0515 08/08/18 0832  BP: (!) 120/96 114/82 132/86 126/73  Pulse: (!) 130 (!) 119 95 (!) 37  Resp:      Temp:   97.7 F (36.5 C)   TempSrc:   Oral   SpO2: 94% 95%  99% 95%  Weight:   112 kg   Height:        GEN- The patient is well appearing, alert and oriented x 3 today.   HEENT: normocephalic, atraumatic; sclera clear, conjunctiva pink; hearing intact; oropharynx clear; neck supple Lungs- Clear to ausculation bilaterally, normal work of breathing.  No wheezes, rales, rhonchi Heart- tachycardic irregular rate and rhythm  GI- soft, non-tender, non-distended, bowel sounds present Extremities- no clubbing, cyanosis, or edema  MS- no significant deformity or atrophy Skin- warm and dry, no rash or lesion Psych- euthymic mood, full affect Neuro- strength and sensation are intact  Labs:   Lab Results  Component Value Date   WBC 8.5 08/07/2018   HGB 15.6 08/07/2018   HCT 47.8 08/07/2018   MCV 89.7 08/07/2018   PLT 198 08/07/2018    Recent Labs  Lab 08/07/18 1356  NA 138  K 4.4  CL 108  CO2 19*  BUN 18  CREATININE 1.57*  CALCIUM 9.0  GLUCOSE 274*      Radiology/Studies: Dg Chest 2 View  Result Date: 07/11/2018 CLINICAL DATA:  62 year old male with a history of shortness of breath EXAM: CHEST - 2 VIEW COMPARISON:  CT 05/17/2018, plain film 11/24/2015 FINDINGS: Cardiomediastinal silhouette likely unchanged with the heart borders partially obscured by overlying lung and pleural disease. Reticulonodular opacity bilateral lungs, worse at the lower lungs and on the right. Pleuroparenchymal thickening on the right more pronounced than the prior with blunting of the right costophrenic angle and opacification of the costophrenic sulcus on the lateral view. Interlobular septal thickening. Thickening of the fissures on the lateral view. No displaced fracture. IMPRESSION: Pulmonary edema superimposed on chronic interstitial disease, with small right-sided pleural effusion. Atypical infection cannot be excluded. Electronically Signed   By: Corrie Mckusick D.O.   On: 07/11/2018 14:08    BHA:LPFXTK flutter, V rate 137 (personally reviewed)  TELEMETRY:  atrial fibrillation, rate 90-120's (personally reviewed)  Assessment/Plan: 1.  Paroxysmal atrial fibrillation He has recurrent paroxysmal atrial fibrillation.  Fortunately, he is not very symptomatic with his arrhythmia. He does not have AAD options with pulmonary fibrosis, CAD, and CKD.  QTc is >4103mec at baseline.  We could try Multaq, but this may be cost prohibitive. For now, would recommend rate control.  Continue Eliquis for CHADS2VASC of 2  2.  Pulmonary fibrosis Per pulmonary  Electrophysiology team to see as needed while here. Please call with questions.  For questions or updates, please contact [place] [place] consult [email or web address] for contact info under Cardiology/STEMI.  Signed, AChanetta Marshall NP 08/08/2018 11:20 AM   I have seen and examined this patient with AChanetta Marshall  Agree with above, note added to reflect my findings.  On exam, iRRR, no murmurs, lungs clear. Patient admitted with AF and high HR. Was at an infusion center who noted his HR to be elevated and was thus sent over to the hospital. HR have been in the 120s at times. At this point, would not tolerate many rhythm control meds. Agree with rate control with toprol XL.    Gabrelle Roca  Alesia Morin MD 08/08/2018 1:22 PM

## 2018-08-08 NOTE — Progress Notes (Signed)
Progress Note  Patient Name: Jerry Montgomery Date of Encounter: 08/08/2018  Primary Cardiologist: Nicki Guadalajara, MD    Subjective   62 year old gentleman with a history of pulmonary fibrosis and paroxysmal atrial fibrillation. Is admitted yesterday from short stay center after receiving an experimental infusion for his pulmonary fibrosis.  He is found to have atrial flutter with a rapid ventricular response.  This morning he is in atrial fibrillation with a rapid ventricular response with a heart rate in 120s 130s.  He is been started on Toprol-XL 100 mg a day.  Inpatient Medications    Scheduled Meds: . apixaban  5 mg Oral BID  . aspirin  81 mg Oral Daily  . atorvastatin  40 mg Oral q1800  . STUDY - FIBROGEN - pamrevlumab or placebo  30 mg/kg (Order-Specific) Intravenous Once  . insulin aspart  0-15 Units Subcutaneous TID WC  . insulin aspart  0-5 Units Subcutaneous QHS  . insulin aspart  6 Units Subcutaneous TID WC  . insulin glargine  50 Units Subcutaneous QHS  . isosorbide mononitrate  60 mg Oral Daily  . levothyroxine  125 mcg Oral QAC breakfast  . mouth rinse  15 mL Mouth Rinse BID  . metFORMIN  850 mg Oral BID WC  . metoprolol succinate  100 mg Oral Daily  . zolpidem  10 mg Oral QHS   Continuous Infusions:  PRN Meds: albuterol, nitroGLYCERIN   Vital Signs    Vitals:   08/07/18 2300 08/08/18 0110 08/08/18 0515 08/08/18 0832  BP: (!) 120/96 114/82 132/86 126/73  Pulse: (!) 130 (!) 119 95 (!) 37  Resp:      Temp:   97.7 F (36.5 C)   TempSrc:   Oral   SpO2: 94% 95% 99% 95%  Weight:   112 kg   Height:        Intake/Output Summary (Last 24 hours) at 08/08/2018 1030 Last data filed at 08/08/2018 0900 Gross per 24 hour  Intake 580 ml  Output -  Net 580 ml   Filed Weights   08/07/18 0807 08/07/18 1355 08/08/18 0515  Weight: 114.3 kg 113.8 kg 112 kg    Telemetry  Atrial fib with HR 120s to 130s  - Personally Reviewed  ECG     atrial flutter -  Personally Reviewed  Physical Exam   GEN: No acute distress. But emotionally aggitated  Neck: No JVD Cardiac:  Irreg Irreg.  Respiratory: Clear to auscultation bilaterally. GI: Soft, nontender, non-distended  MS: No edema; No deformity. Neuro:  Nonfocal  Psych: Normal affect   Labs    Chemistry Recent Labs  Lab 08/07/18 1356  NA 138  K 4.4  CL 108  CO2 19*  GLUCOSE 274*  BUN 18  CREATININE 1.57*  CALCIUM 9.0  GFRNONAA 45*  GFRAA 52*  ANIONGAP 11     Hematology Recent Labs  Lab 08/07/18 1356  WBC 8.5  RBC 5.33  HGB 15.6  HCT 47.8  MCV 89.7  MCH 29.3  MCHC 32.6  RDW 12.8  PLT 198    Cardiac EnzymesNo results for input(s): TROPONINI in the last 168 hours. No results for input(s): TROPIPOC in the last 168 hours.   BNPNo results for input(s): BNP, PROBNP in the last 168 hours.   DDimer No results for input(s): DDIMER in the last 168 hours.   Radiology    No results found.  Cardiac Studies      Patient Profile     62 y.o.  male with pulmonary fibrosis and PAF   Assessment & Plan    1.  Atrial fibrillation/atrial flutter: Patient seems to be fairly stable.  His heart rate is a little fast.  He is been started on Toprol-XL 100 mg a day starting this morning. I discussed the case with electrophysiology.  He has a prolonged QT is not a candidate for Tikosyn.  He has pulmonary fibrosis and is likely not a candidate for amiodarone.  They have suggested that we continue with rate control and anticoagulation.  2.  Pulmonary fibrosis: Continue plans per pulmonary team.  For questions or updates, please contact [facility] Please consult [email or web address] for contact info under        Signed, Kristeen Miss, MD  08/08/2018, 10:30 AM

## 2018-08-09 ENCOUNTER — Telehealth (HOSPITAL_COMMUNITY): Payer: Self-pay | Admitting: Physician Assistant

## 2018-08-09 DIAGNOSIS — J449 Chronic obstructive pulmonary disease, unspecified: Secondary | ICD-10-CM | POA: Diagnosis not present

## 2018-08-09 DIAGNOSIS — J8410 Pulmonary fibrosis, unspecified: Secondary | ICD-10-CM | POA: Diagnosis not present

## 2018-08-09 DIAGNOSIS — I4891 Unspecified atrial fibrillation: Secondary | ICD-10-CM | POA: Diagnosis not present

## 2018-08-09 DIAGNOSIS — R269 Unspecified abnormalities of gait and mobility: Secondary | ICD-10-CM | POA: Diagnosis not present

## 2018-08-09 DIAGNOSIS — J438 Other emphysema: Secondary | ICD-10-CM | POA: Diagnosis not present

## 2018-08-09 DIAGNOSIS — I4892 Unspecified atrial flutter: Secondary | ICD-10-CM | POA: Diagnosis not present

## 2018-08-09 LAB — GLUCOSE, CAPILLARY
GLUCOSE-CAPILLARY: 66 mg/dL — AB (ref 70–99)
GLUCOSE-CAPILLARY: 83 mg/dL (ref 70–99)
Glucose-Capillary: 101 mg/dL — ABNORMAL HIGH (ref 70–99)

## 2018-08-09 MED ORDER — METOPROLOL TARTRATE 100 MG PO TABS
100.0000 mg | ORAL_TABLET | Freq: Two times a day (BID) | ORAL | Status: DC
  Administered 2018-08-09: 100 mg via ORAL
  Filled 2018-08-09: qty 1

## 2018-08-09 MED ORDER — DILTIAZEM HCL 60 MG PO TABS
30.0000 mg | ORAL_TABLET | Freq: Once | ORAL | Status: AC
  Administered 2018-08-09: 30 mg via ORAL
  Filled 2018-08-09: qty 1

## 2018-08-09 MED ORDER — DILTIAZEM HCL 60 MG PO TABS
30.0000 mg | ORAL_TABLET | Freq: Four times a day (QID) | ORAL | Status: DC
  Administered 2018-08-09: 30 mg via ORAL
  Filled 2018-08-09: qty 1

## 2018-08-09 MED ORDER — METOPROLOL TARTRATE 100 MG PO TABS: 100.0000 mg | ORAL_TABLET | Freq: Two times a day (BID) | ORAL | 3 refills | Status: DC

## 2018-08-09 NOTE — Progress Notes (Addendum)
Progress Note  Patient Name: Jerry Montgomery Date of Encounter: 08/09/2018  Primary Cardiologist: Nicki Guadalajara, MD   Subjective   The patient is agitated this morning as he feels that nothing is being done for him and he could treat himself at home.  He denies any chest discomfort or difficulty breathing above his normal.  He says that he is not having any symptoms related to his elevated heart rate.  Inpatient Medications    Scheduled Meds: . apixaban  5 mg Oral BID  . aspirin  81 mg Oral Daily  . atorvastatin  40 mg Oral q1800  . STUDY - FIBROGEN - pamrevlumab or placebo  30 mg/kg (Order-Specific) Intravenous Once  . insulin aspart  0-15 Units Subcutaneous TID WC  . insulin aspart  0-5 Units Subcutaneous QHS  . insulin aspart  6 Units Subcutaneous TID WC  . insulin glargine  50 Units Subcutaneous QHS  . isosorbide mononitrate  60 mg Oral Daily  . levothyroxine  125 mcg Oral QAC breakfast  . mouth rinse  15 mL Mouth Rinse BID  . metFORMIN  850 mg Oral BID WC  . metoprolol succinate  100 mg Oral Daily  . zolpidem  10 mg Oral QHS   Continuous Infusions:  PRN Meds: albuterol, nitroGLYCERIN   Vital Signs    Vitals:   08/08/18 1636 08/08/18 2030 08/08/18 2043 08/09/18 0645  BP:   (!) 142/93 (!) 146/99  Pulse: (!) 143 90 (!) 58 (!) 133  Resp:      Temp:   98.3 F (36.8 C) 97.7 F (36.5 C)  TempSrc:   Oral Oral  SpO2:  94% 97% 95%  Weight:    112.2 kg  Height:        Intake/Output Summary (Last 24 hours) at 08/09/2018 0747 Last data filed at 08/08/2018 2330 Gross per 24 hour  Intake 800 ml  Output -  Net 800 ml   Filed Weights   08/07/18 1355 08/08/18 0515 08/09/18 0645  Weight: 113.8 kg 112 kg 112.2 kg    Telemetry    Atrial flutter 120s-130s- Personally Reviewed  ECG    No new tracings- Personally Reviewed  Physical Exam   GEN: No acute distress.   Neck: No JVD Cardiac:  Irregularly irregular, tachycardic, no murmurs, rubs, or gallops.    Respiratory: Clear to auscultation bilaterally. GI: Soft, nontender, non-distended  MS: No edema; No deformity. Neuro:  Nonfocal  Psych: Normal affect   Labs    Chemistry Recent Labs  Lab 08/07/18 1356  NA 138  K 4.4  CL 108  CO2 19*  GLUCOSE 274*  BUN 18  CREATININE 1.57*  CALCIUM 9.0  GFRNONAA 45*  GFRAA 52*  ANIONGAP 11     Hematology Recent Labs  Lab 08/07/18 1356  WBC 8.5  RBC 5.33  HGB 15.6  HCT 47.8  MCV 89.7  MCH 29.3  MCHC 32.6  RDW 12.8  PLT 198    Cardiac EnzymesNo results for input(s): TROPONINI in the last 168 hours. No results for input(s): TROPIPOC in the last 168 hours.   BNPNo results for input(s): BNP, PROBNP in the last 168 hours.   DDimer No results for input(s): DDIMER in the last 168 hours.   Radiology    No results found.  Cardiac Studies   Echocardiogram 07/18/2018 Study Conclusions - Left ventricle: The cavity size was normal. There was mild   concentric hypertrophy. Systolic function was mildly reduced. The   estimated ejection  fraction was in the range of 45% to 50%.   Although no diagnostic regional wall motion abnormality was   identified, this possibility cannot be completely excluded on the   basis of this study. Features are consistent with a pseudonormal   left ventricular filling pattern, with concomitant abnormal   relaxation and increased filling pressure (grade 2 diastolic   dysfunction). - Mitral valve: Calcified annulus. Systolic bowing without   prolapse. - Left atrium: The atrium was mildly dilated. - Right ventricle: The cavity size was mildly dilated. Systolic   function was mildly reduced.  Patient Profile     62 y.o. male with history of pulmonary fibrosis on home oxygen, CAD, CKD, type II DM, hypertension, hyperlipidemia, OSA not using CPAP and PAF.  He developed atrial flutter with rapid ventricular response after receiving an experimental infusion for his pulmonary fibrosis.  Assessment & Plan     1.  Atrial fibrillation/flutter: The patient is continued on his home Toprol-XL 100 mg daily.  We gave an extra metoprolol tartrate 50 mg yesterday for elevated heart rate when he was upset.  He is still in atrial flutter with rates in the 120s-130s.  He has a prolonged QT and is not a candidate for Tikosyn.  His pulmonary fibrosis is a contraindication for amiodarone.  Per electrophysiology, we are continuing to aim for rate control.  He is anticoagulated with apixaban 5 mg twice daily for CHA2DS2-VASc Score of 3 (HTN, CAD, DM) He has had better rate control in the past on Toprol-XL 200 mg daily but this was reduced when his heart rate was in the 50s and he said he was fatigued.  After discussion with Dr. Elease Hashimoto, will increase his beta-blocker to metoprolol tartrate 100 mg twice daily.   2.  Hypertension: Blood pressure is elevated last night and this morning 140s/90s.  Is elevated blood pressure may be somewhat related to his agitation this morning.  May see some improvement on increase in metoprolol.  3.  Pulmonary fibrosis: Management per pulmonary team.  For questions or updates, please contact [facility] Please consult [email or web address] for contact info under      Signed, Berton Bon, NP  08/09/2018, 7:47 AM    Attending Note:   The patient was seen and examined.  Agree with assessment and plan as noted above.  Changes made to the above note as needed.  Patient seen and independently examined with  Lizabeth Leyden, NP .   We discussed all aspects of the encounter. I agree with the assessment and plan as stated above.  Atrial flutter with rapid ventricular response.  We have increased his metoprolol to 100 mg twice a day.  His heart rate is persistent at 135.  We will try adding diltiazem 30 mg every 6 hours.  Electrophysiology has seen him and is not a candidate for amiodarone or Tikosyn. They recommend rate control and anticoagulation.   I have spent a total of 40 minutes with  patient reviewing hospital  notes , telemetry, EKGs, labs and examining patient as well as establishing an assessment and plan that was discussed with the patient. > 50% of time was spent in direct patient care.    Vesta Mixer, Montez Hageman., MD, [facility] 08/09/2018, 10:14 AM [address],  [address] Office [phone] Pager [phone]

## 2018-08-09 NOTE — Telephone Encounter (Signed)
New message   Per Lizabeth Leyden [facility] Appointment 06/05/2018 at 1:30 [facility].

## 2018-08-09 NOTE — Discharge Summary (Addendum)
Discharge Summary    Patient ID: Jerry Montgomery MRN: [id number]; DOB: 17-Mar-1956  Admit date: 08/07/2018 Discharge date: 08/09/2018  Primary Care Provider: [name], MD  Primary Cardiologist: Shelva Majestic, MD  Primary Electrophysiologist:  Dr. Lovena Le  Discharge Diagnoses    Active Problems:   Atrial flutter with rapid ventricular response (HCC)   Allergies Allergies  Allergen Reactions  . Niaspan [Niacin] Itching  . Ofev [Nintedanib] Other (See Comments)    Lethargic, wiped out  . Pirfenidone     Other reaction(s): Other (See Comments)  . Ramipril Other (See Comments)    Cough  Other reaction(s): Other (See Comments) Cough    Diagnostic Studies/Procedures    none _____________   History of Present Illness     Jerry Montgomery is a 62 y.o. male with history of pulmonary fibrosis on home oxygen, CAD, CKD, type II DM, hypertension, hyperlipidemia, OSA not using CPAP and PAF.  He developed atrial flutter with rapid ventricular response after receiving an experimental infusion for his pulmonary fibrosis.  Hospital Course     Consultants: EP  1.  Atrial fibrillation/flutter: The patient is continued on his home Toprol-XL 100 mg daily.  We gave an extra metoprolol tartrate 50 mg yesterday for elevated heart rate when he was upset.  He is still in atrial flutter with rates in the 120s-130s. He is asymptomatic. He has a prolonged QT and is not a candidate for Tikosyn.  His pulmonary fibrosis is a contraindication for amiodarone.  Per electrophysiology consult, we are continuing to aim for rate control.  He is anticoagulated with apixaban 5 mg twice daily for CHA2DS2-VASc Score of 3 (HTN, CAD, DM) He has had better rate control in the past on Toprol-XL 200 mg daily but this was reduced when his heart rate was in the 50s and he said he was fatigued.  We have increased his beta-blocker to metoprolol tartrate 100 mg twice daily. We have given cardizem 60 mg with no change  in rate. The patient is very agitated and insistent on going home. We feel that his agitation is contributing to his elevated heart rate and will go ahead and discharge him with close follow up in the office. He has the cardia app on his phone and watches it closely.  Can decide if need to add diltiazem at follow up if rate still elevated.   2.  Hypertension: Blood pressure was elevated but now better on  increase in metoprolol.  3.  Pulmonary fibrosis: Management per pulmonary team. _____________  Discharge Vitals Blood pressure 110/75, pulse (!) 134, temperature 98.4 F (36.9 C), temperature source Oral, resp. rate 20, height 6' 1"  (1.854 m), weight 112.2 kg, SpO2 94 %.  Filed Weights   08/07/18 1355 08/08/18 0515 08/09/18 0645  Weight: 113.8 kg 112 kg 112.2 kg    Labs & Radiologic Studies    CBC Recent Labs    08/07/18 1356  WBC 8.5  HGB 15.6  HCT 47.8  MCV 89.7  PLT 409   Basic Metabolic Panel Recent Labs    08/07/18 1356  NA 138  K 4.4  CL 108  CO2 19*  GLUCOSE 274*  BUN 18  CREATININE 1.57*  CALCIUM 9.0   Liver Function Tests No results for input(s): AST, ALT, ALKPHOS, BILITOT, PROT, ALBUMIN in the last 72 hours. No results for input(s): LIPASE, AMYLASE in the last 72 hours. Cardiac Enzymes No results for input(s): CKTOTAL, CKMB, CKMBINDEX, TROPONINI in the last 72 hours.  BNP Invalid input(s): POCBNP D-Dimer No results for input(s): DDIMER in the last 72 hours. Hemoglobin A1C No results for input(s): HGBA1C in the last 72 hours. Fasting Lipid Panel No results for input(s): CHOL, HDL, LDLCALC, TRIG, CHOLHDL, LDLDIRECT in the last 72 hours. Thyroid Function Tests No results for input(s): TSH, T4TOTAL, T3FREE, THYROIDAB in the last 72 hours.  Invalid input(s): FREET3 _____________  Dg Chest 2 View  Result Date: 07/11/2018 CLINICAL DATA:  62 year old male with a history of shortness of breath EXAM: CHEST - 2 VIEW COMPARISON:  CT 05/17/2018, plain film  11/24/2015 FINDINGS: Cardiomediastinal silhouette likely unchanged with the heart borders partially obscured by overlying lung and pleural disease. Reticulonodular opacity bilateral lungs, worse at the lower lungs and on the right. Pleuroparenchymal thickening on the right more pronounced than the prior with blunting of the right costophrenic angle and opacification of the costophrenic sulcus on the lateral view. Interlobular septal thickening. Thickening of the fissures on the lateral view. No displaced fracture. IMPRESSION: Pulmonary edema superimposed on chronic interstitial disease, with small right-sided pleural effusion. Atypical infection cannot be excluded. Electronically Signed   By: Corrie Mckusick D.O.   On: 07/11/2018 14:08   Disposition   Pt is being discharged home today in good condition.  Follow-up Plans & Appointments    Follow-up Information    Erlene Quan, PA-C Follow up.   Specialties:  Cardiology, Radiology Why:  Cardiology hospital follow on October 29th at 1:30 pm.  Contact information: [place] [address] [phone]          Discharge Instructions    Diet - low sodium heart healthy   Complete by:  As directed    Increase activity slowly   Complete by:  As directed       Discharge Medications   Allergies as of 08/09/2018      Reactions   Niaspan [niacin] Itching   Ofev [nintedanib] Other (See Comments)   Lethargic, wiped out   Pirfenidone    Other reaction(s): Other (See Comments)   Ramipril Other (See Comments)   Cough Other reaction(s): Other (See Comments) Cough      Medication List    STOP taking these medications   metoprolol succinate 100 MG 24 hr tablet Commonly known as:  TOPROL-XL     TAKE these medications   ALPRAZolam 0.5 MG tablet Commonly known as:  XANAX Take 1 tablet (0.5 mg total) by mouth at bedtime as needed for anxiety.   aspirin 81 MG tablet Take 81 mg by mouth every morning.     atorvastatin 40 MG tablet Commonly known as:  LIPITOR Take 1 tablet by mouth every night at bedtime   BIOFREEZE EX Apply 1 application topically as needed (for pain).   blood glucose meter kit and supplies Kit Dispense based on patient and insurance preference. Use up to four times daily as directed. (FOR ICD E11.65)   dapagliflozin propanediol 10 MG Tabs tablet Commonly known as:  FARXIGA Take 10 mg by mouth daily.   ELIQUIS 5 MG Tabs tablet Generic drug:  apixaban Take 5 mg by mouth 2 (two) times daily.   FISH OIL PO Take 2 capsules by mouth 2 (two) times daily.   furosemide 20 MG tablet Commonly known as:  LASIX Take 1 tablet (20 mg total) by mouth daily as needed for fluid (fluid). What changed:  when to take this   insulin aspart 100 UNIT/ML injection Commonly known as:  novoLOG 15  UNITS WITH BREAKFAST AND 15 UNITS WITH LUNCH, 20 UNITS WITH SUPPER What changed:    how much to take  how to take this  when to take this  additional instructions   insulin glargine 100 UNIT/ML injection Commonly known as:  LANTUS Inject 0.6 mLs (60 Units total) into the skin at bedtime.   isosorbide mononitrate 60 MG 24 hr tablet Commonly known as:  IMDUR Take 60 mg by mouth every day What changed:  See the new instructions.   levothyroxine 125 MCG tablet Commonly known as:  SYNTHROID, LEVOTHROID Take 1 tablet (125 mcg total) by mouth daily.   metFORMIN 850 MG tablet Commonly known as:  GLUCOPHAGE Take 1 tablet by mouth twice a day What changed:    when to take this  additional instructions   metoprolol tartrate 100 MG tablet Commonly known as:  LOPRESSOR Take 1 tablet (100 mg total) by mouth 2 (two) times daily.   nitroGLYCERIN 0.4 MG SL tablet Commonly known as:  NITROSTAT Place 1 tablet (0.4 mg total) under the tongue every 5 (five) minutes as needed for chest pain.   omega-3 acid ethyl esters 1 g capsule Commonly known as:  LOVAZA Take 2 capsules (2 g  total) by mouth 2 (two) times daily.   ONE TOUCH ULTRA SYSTEM KIT w/Device Kit Use to monitor FSBS 4x daily for fluctuating blood sugars. Dx: E11.65.   ONE TOUCH ULTRA 2 w/Device Kit Use to monitor FSBS 4x daily for fluctuating blood sugars. Dx: E11.65.   ONE TOUCH ULTRA TEST test strip Generic drug:  glucose blood Use to monitor FSBS 4x daily for fluctuating blood sugars. Dx: E11.65.   ONETOUCH DELICA LANCETS FINE Misc Test up to 4 times a day as directed.   OXYGEN Inhale 6-8 L into the lungs continuous.   SYRINGE/NEEDLE (DISP) 1 ML 25G X 5/8" 1 ML Misc As directed   zolpidem 10 MG tablet Commonly known as:  AMBIEN Take 1 tablet by mouth at bedtime.        Acute coronary syndrome (MI, NSTEMI, STEMI, etc) this admission?: No.    Outstanding Labs/Studies     Duration of Discharge Encounter   Greater than 30 minutes including physician time.  Signed, Daune Perch, NP 08/09/2018, 4:07 PM   Attending Note:   The patient was seen and examined.  Agree with assessment and plan as noted above.  Changes made to the above note as needed.  Patient seen and independently examined with  Pecolia Ades, NP .   We discussed all aspects of the encounter. I agree with the assessment and plan as stated above.  1.   Pt continues to have rapid atrial flutter. He insists on being discharged today . He has been placed on metoprolol 100 mg BID which has been successful in controlling his HR in the past I am hesitant to add Diltiazem in the outpatient setting at this point .   Will have him follow up with [place] very soon and we can add Diltiazem at that point if his BP is still elevated.   2.  PUlmonary fibrosis :   Plans per IM and pulmonary    I have spent a total of 40 minutes with patient reviewing hospital  notes , telemetry, EKGs, labs and examining patient as well as establishing an assessment and plan that was discussed with the patient. > 50% of time was spent in direct  patient care.    Thayer Headings, Brooke Bonito., MD, [facility] 08/11/2018,  10:18 AM [address],  [place] Pager [phone]

## 2018-08-09 NOTE — Telephone Encounter (Signed)
Current Admit

## 2018-08-12 ENCOUNTER — Ambulatory Visit (INDEPENDENT_AMBULATORY_CARE_PROVIDER_SITE_OTHER): Payer: PPO | Admitting: Family Medicine

## 2018-08-12 ENCOUNTER — Encounter: Payer: Self-pay | Admitting: Family Medicine

## 2018-08-12 VITALS — BP 100/74 | HR 98 | Temp 97.5°F | Resp 16 | Ht 73.0 in | Wt 251.0 lb

## 2018-08-12 DIAGNOSIS — I4891 Unspecified atrial fibrillation: Secondary | ICD-10-CM | POA: Diagnosis not present

## 2018-08-12 DIAGNOSIS — Z794 Long term (current) use of insulin: Secondary | ICD-10-CM

## 2018-08-12 DIAGNOSIS — Z23 Encounter for immunization: Secondary | ICD-10-CM

## 2018-08-12 DIAGNOSIS — Z7901 Long term (current) use of anticoagulants: Secondary | ICD-10-CM | POA: Diagnosis not present

## 2018-08-12 DIAGNOSIS — J8410 Pulmonary fibrosis, unspecified: Secondary | ICD-10-CM

## 2018-08-12 DIAGNOSIS — E1100 Type 2 diabetes mellitus with hyperosmolarity without nonketotic hyperglycemic-hyperosmolar coma (NKHHC): Secondary | ICD-10-CM

## 2018-08-12 MED ORDER — NITROGLYCERIN 0.4 MG SL SUBL: 0.4000 mg | SUBLINGUAL_TABLET | SUBLINGUAL | 1 refills | Status: AC | PRN

## 2018-08-12 MED ORDER — DILTIAZEM HCL ER COATED BEADS 180 MG PO CP24: 180.0000 mg | ORAL_CAPSULE | Freq: Every day | ORAL | 1 refills | Status: DC

## 2018-08-12 NOTE — Telephone Encounter (Signed)
Patient contacted regarding discharge from [facility] on 08/09/18.  Patient understands to follow up with provider Corine Shelter, PA on 08/20/18 at 1:30 at [facility]. Patient understands discharge instructions? Yes (patient did not receive discharge instructions, as he left AMA) Patient understands medications and regiment? Yes Patient understands to bring all medications to this visit? Yes  Patient left hospital, as he felt that they were doing nothing, states his heart rate is still in the 130's, he denies any current chest pains on the phone and would like to wait until his appointment coming up, as he was not going back to the hospital. Patient advised to call us with any issues.

## 2018-08-12 NOTE — Addendum Note (Signed)
Addended by: Legrand Rams B on: 08/12/2018 04:15 PM   Modules accepted: Orders

## 2018-08-12 NOTE — Progress Notes (Signed)
Subjective:    Patient ID: Jerry Montgomery, male    DOB: August 29, 1956, 62 y.o.   MRN: [id number]  HPI   07/09/18 Patient is a very pleasant 62 year old Caucasian male with a significant past cardiovascular history.  I have copied the history from his recent cardiology appointment for a brief synopsis:  Jerry Montgomery has history of known coronary artery disease in May 2004 underwent stenting of his proximal LAD and proximal circumflex coronary arteries. In July 2008 he developed a new 85% stenosis of the mid circumflex vessel at which time a 3.5x13 mm Cypher stent was inserted. His 2 previously placed stents remain patent. At his last catheterization in March 2011 he was found to have 95% stenosis between the tube previously placed stents in the circumflex and a new 3.0x20 mm post and was inserted to cover the mid area and also cover the proximal region. His LAD stent was patent with 40% proximal and mid stenosis obtuse marginal 1 vessel at 60-70% narrowing the distal circumflex had 30% narrowing and a nondominant right coronary artery had 80% stenosis.   He also has been treated for Wolff-Parkinson-White syndrome by Dr. Lovena Le with ablation for SVT.    Recently has been entered in an investigational study under the care of his pulmonologist for idiopathic pulmonary fibrosis.  Since he has been receiving the investigational drug, he has noticed palpitations and tachyarrhythmias.  His smart phone has an app that has alerted him for possible atrial fibrillation.  Symptoms have been particularly bad over the last 2 weeks.  Patient reports feeling his heart race and skip.  He presents today for further evaluation.  On presentation the patient has an irregularly irregular rhythm.  EKG shows an irregularly irregular rhythm consistent with atrial fibrillation.  There is no evidence of ischemia however there are Q waves in the inferior leads suggesting previous inferior infarction.  Patient's heart rate throughout  today's encounter is between 110 and 120 bpm.  There is no evidence of fluid overload or congestive heart failure on exam.  He is sitting comfortably talking without increased respiratory effort.  He is currently taking Toprol-XL 100 mg a day.  At that time, my plan was: Patient is on aspirin and Plavix.  I believe he needs Eliquis now due to the atrial fibrillation.  Therefore I will have him discontinue Plavix but continue aspirin.  Begin Eliquis 5 mg p.o. twice daily.  Consult cardiology.  Check TSH to ensure adequate dosage of levothyroxine.  Focus on rate control by increasing Toprol-XL from 100 mg a day to 200 mg a day and recheck heart rate later this week on Friday.  Most lab work is listed below and aside from poorly controlled diabetes showed no serious abnormalities.  We will discuss the management of his sugars on Friday.  08/12/18 Unfortunately, he was recently admitted due to a fib with RVR.  I have copied relevant portions of the discharge summary below for my reference: Admit date: 08/07/2018 Discharge date: 08/09/2018  Discharge Diagnoses    Jerry Montgomery is a 62 y.o.malewith history of pulmonary fibrosison home oxygen,CAD, CKD,type II DM, hypertension, hyperlipidemia,OSA not using CPAPand PAF. He developed atrial flutter with rapid ventricular response after receiving an experimental infusion for his pulmonary fibrosis.  1. Atrial fibrillation/flutter: The patient is continued on his home Toprol-XL 100 mg daily. We gave an extra metoprolol tartrate 50 mg yesterday for elevated heart rate when he was upset.He is still in atrial flutter with rates in  the 120s-130s.He is asymptomatic.He has a prolonged QT and is not a candidate for Tikosyn. His pulmonary fibrosis is a contraindication for amiodarone. Per electrophysiology consult, we are continuing to aim for rate control. He is anticoagulated with apixaban 5 mg twice daily forCHA2DS2-VASc Score of 3 (HTN, CAD, DM) He  has had better rate control in the past on Toprol-XL 200 mg daily but this was reduced when his heart rate was in the 50s and he said he was fatigued. We have increased his beta-blocker to metoprolol tartrate 100 mg twice daily.We have given cardizem 60 mg with no change in rate. The patient is very agitated and insistent on going home. We feel that his agitation is contributing to his elevated heart rate and will go ahead and discharge him with close follow up in the office. He has the cardia app on his phone and watches it closely.  Can decide if need to add diltiazem at follow up if rate still elevated.    Patient is here today for follow-up.  Since discharge from the hospital, the patient's heart rate has been in the 130s to 140s.  He has this documented on his phone app.  I calculate his heart rate today to be in the 120s to 130s.  Blood pressure is also borderline at 100/74.  I verify this myself.  Patient denies any chest pain.  He denies any shortness of breath beyond his baseline.  He denies any syncope or presyncope.  Despite his significant tachycardia, he is sitting comfortably in her room having a conversation in no apparent distress.  He is taking metoprolol 100 mg twice daily and he has not missed any doses.  He is also on isosorbide however he denies any angina.    Past Medical History:  Diagnosis Date  . Acute diastolic heart failure ([place])   . Atrial fibrillation ([place])   . Atrial tachycardia ([place])   . CAD (coronary artery disease)   . COPD (chronic obstructive pulmonary disease) ([place])   . Hyperlipidemia   . Hypertension   . Hypothyroidism   . On home oxygen therapy    "3L; 24/7" (04/15/2015)  . Pneumothorax on right 4/11  . Postinflammatory pulmonary fibrosis ([place])   . Sleep apnea    "won't use CPAP" (04/14/2105)  . SVT (supraventricular tachycardia) ([place])    Per Dr. Tanna Furry note  . Type II diabetes mellitus ([place])   . WPW (Wolff-Parkinson-White syndrome)    Per Dr.  Tanna Furry Note   Past Surgical History:  Procedure Laterality Date  . BILATERAL VATS ABLATION Right   . CARDIAC CATHETERIZATION  05/03/2007   patent stents  . CARDIAC CATHETERIZATION N/A 05/18/2015   Procedure: Right Heart Cath;  Surgeon: Belva Crome, MD;  Location: [facility];  Service: Cardiovascular;  Laterality: N/A;  . CORONARY ANGIOPLASTY WITH STENT PLACEMENT  03/05/2003; 2008; 01/13/2010   PCI & Stent  LAD & mid CX; stent to CX  (I've got 4 stents total" (04/15/2015)  . ELECTROPHYSIOLOGIC STUDY N/A 04/14/2015   Procedure: SVT Ablation;  Surgeon: Evans Lance, MD;  Location: [facility];  Service: Cardiovascular;  Laterality: N/A;  . KNEE ARTHROSCOPY Right 01/22/2013   Procedure: RIGHT ARTHROSCOPY KNEE WITH DEBRIDEMENT, abrasion chondroplasty of lateral tibial plateau, debridement of partial tear of ACL, menisectomy;  Surgeon: Tobi Bastos, MD;  Location: [facility];  Service: Orthopedics;  Laterality: Right;  . KNEE SURGERY  2012   "reattached quad"  . LUNG LOBECTOMY Right   .  SHOULDER ARTHROSCOPY W/ ROTATOR CUFF REPAIR Bilateral   . VIDEO ASSISTED THORACOSCOPY Left 07/19/2015   Procedure: LEFT VIDEO ASSISTED THORACOSCOPY WITH LEFT UPPER AND LOWER LUNG BIOPSY;  Surgeon: Melrose Nakayama, MD;  Location: [facility];  Service: Thoracic;  Laterality: Left;  Marland Kitchen VIDEO BRONCHOSCOPY N/A 07/19/2015   Procedure: VIDEO BRONCHOSCOPY WITH FLUORO;  Surgeon: Melrose Nakayama, MD;  [facility];  Service: Thoracic;  Laterality: N/A;   Current Outpatient Medications on File Prior to Visit  Medication Sig Dispense Refill  . ALPRAZolam (XANAX) 0.5 MG tablet Take 1 tablet (0.5 mg total) by mouth at bedtime as needed for anxiety. 30 tablet 0  . apixaban (ELIQUIS) 5 MG TABS tablet Take 5 mg by mouth 2 (two) times daily.    Marland Kitchen aspirin 81 MG tablet Take 81 mg by mouth every morning.     Marland Kitchen atorvastatin (LIPITOR) 40 MG tablet Take 1 tablet by mouth every night at bedtime 30 tablet 2  . blood  glucose meter kit and supplies KIT Dispense based on patient and insurance preference. Use up to four times daily as directed. (FOR ICD E11.65) 1 each 0  . Blood Glucose Monitoring Suppl (ONE TOUCH ULTRA 2) w/Device KIT Use to monitor FSBS 4x daily for fluctuating blood sugars. Dx: E11.65. 1 each 0  . Blood Glucose Monitoring Suppl (ONE TOUCH ULTRA SYSTEM KIT) w/Device KIT Use to monitor FSBS 4x daily for fluctuating blood sugars. Dx: E11.65. 1 each 1  . dapagliflozin propanediol (FARXIGA) 10 MG TABS tablet Take 10 mg by mouth daily. 30 tablet 0  . furosemide (LASIX) 20 MG tablet Take 1 tablet (20 mg total) by mouth daily as needed for fluid (fluid). (Patient taking differently: Take 20 mg by mouth daily. ) 90 tablet 2  . insulin aspart (NOVOLOG) 100 UNIT/ML injection 15 UNITS WITH BREAKFAST AND 15 UNITS WITH LUNCH, 20 UNITS WITH SUPPER (Patient taking differently: Inject 15-30 Units into the skin See admin instructions. 15 UNITS WITH BREAKFAST AND 15 UNITS WITH LUNCH, 30 UNITS WITH SUPPER) 60 mL 5  . insulin glargine (LANTUS) 100 UNIT/ML injection Inject 0.6 mLs (60 Units total) into the skin at bedtime. 60 mL 3  . isosorbide mononitrate (IMDUR) 60 MG 24 hr tablet Take 60 mg by mouth every day (Patient taking differently: Take 60 mg by mouth daily. ) 90 tablet 3  . levothyroxine (SYNTHROID, LEVOTHROID) 125 MCG tablet Take 1 tablet (125 mcg total) by mouth daily. 90 tablet 2  . Menthol, Topical Analgesic, (BIOFREEZE EX) Apply 1 application topically as needed (for pain).    . metFORMIN (GLUCOPHAGE) 850 MG tablet Take 1 tablet by mouth twice a day (Patient taking differently: Take 850 mg by mouth 2 (two) times daily with a meal. Take 1 tablet by mouth  twice a day) 180 tablet 2  . metoprolol tartrate (LOPRESSOR) 100 MG tablet Take 1 tablet (100 mg total) by mouth 2 (two) times daily. 180 tablet 3  . nitroGLYCERIN (NITROSTAT) 0.4 MG SL tablet Place 1 tablet (0.4 mg total) under the tongue every 5 (five)  minutes as needed for chest pain. 25 tablet 1  . omega-3 acid ethyl esters (LOVAZA) 1 g capsule Take 2 capsules (2 g total) by mouth 2 (two) times daily. 360 capsule 3  . Omega-3 Fatty Acids (FISH OIL PO) Take 2 capsules by mouth 2 (two) times daily.    . ONE TOUCH ULTRA TEST test strip Use to monitor FSBS 4x daily for fluctuating blood sugars.  Dx: E11.65. 400 each 3  . ONETOUCH DELICA LANCETS FINE MISC Test up to 4 times a day as directed. 100 each 5  . OXYGEN Inhale 6-8 L into the lungs continuous.    . SYRINGE/NEEDLE, DISP, 1 ML (B-D SYRINGE/NEEDLE 1CC/25GX5/8) 25G X 5/8" 1 ML MISC As directed 100 each 5  . zolpidem (AMBIEN) 10 MG tablet Take 1 tablet by mouth at bedtime. 90 tablet 1   No current facility-administered medications on file prior to visit.    Allergies  Allergen Reactions  . Niaspan [Niacin] Itching  . Ofev [Nintedanib] Other (See Comments)    Lethargic, wiped out  . Pirfenidone     Other reaction(s): Other (See Comments)  . Ramipril Other (See Comments)    Cough  Other reaction(s): Other (See Comments) Cough   Social History   Socioeconomic History  . Marital status: Single    Spouse name: Not on file  . Number of children: 1  . Years of education: Not on file  . Highest education level: Not on file  Occupational History    Employer: [employer]  . Financial resource strain: Not on file  . Food insecurity:    Worry: Not on file    Inability: Not on file  . Transportation needs:    Medical: Not on file    Non-medical: Not on file  Tobacco Use  . Smoking status: Former Smoker    Packs/day: 1.50    Years: 35.00    Pack years: 52.50    Types: Cigarettes    Last attempt to quit: 10/24/2007    Years since quitting: 10.8  . Smokeless tobacco: Never Used  Substance and Sexual Activity  . Alcohol use: Yes    Alcohol/week: 0.0 standard drinks    Comment: 04/15/2015 "I've had a couple drinks in the last 8 months"  . Drug use: No  . Sexual  activity: Not Currently  Lifestyle  . Physical activity:    Days per week: Not on file    Minutes per session: Not on file  . Stress: Not on file  Relationships  . Social connections:    Talks on phone: Not on file    Gets together: Not on file    Attends religious service: Not on file    Active member of club or organization: Not on file    Attends meetings of clubs or organizations: Not on file    Relationship status: Not on file  . Intimate partner violence:    Fear of current or ex partner: Not on file    Emotionally abused: Not on file    Physically abused: Not on file    Forced sexual activity: Not on file  Other Topics Concern  . Not on file  Social History Narrative  . Not on file       Review of Systems  All other systems reviewed and are negative.      Objective:   Physical Exam  Constitutional: He appears well-developed and well-nourished. No distress.  Neck: No JVD present. No thyromegaly present.  Cardiovascular: S1 normal, S2 normal, normal heart sounds and normal pulses. An irregularly irregular rhythm present. Tachycardia present. Exam reveals no gallop and no friction rub.  No murmur heard. Pulmonary/Chest: No stridor. No respiratory distress. He has no wheezes. He has no rales. He exhibits no tenderness.  Abdominal: Soft. Bowel sounds are normal.  Musculoskeletal: He exhibits no edema.  Skin: He is not diaphoretic.  Vitals  reviewed.         Assessment & Plan:  Atrial fibrillation, unspecified type ([place])  Patient's heart rate is out of control.  However his blood pressure is so low I am afraid abnormal medication due to the risk of hypotension.  Therefore I recommended discontinuation of isosorbide and replacement with Cardizem CD 180 mg p.o. daily.  Recheck heart rate on Thursday to see if there has been improvement.  If not will gradually increase up to a maximum of 360 mg a day as long as the patient's blood pressure can tolerate it.  Patient  has an appointment to meet with his cardiologist next week for further titration.  Follow-up on Thursday as planned.  25 minutes was spent today with the patient reviewing his medical records from the hospital, discussing his options,

## 2018-08-13 ENCOUNTER — Other Ambulatory Visit: Payer: Self-pay

## 2018-08-13 NOTE — Patient Outreach (Signed)
[facility] ([facility]  [facility]   08/13/2018  Carliss S Laumann 07/04/1956 [id number]   62 year old  outreached by [facility] services for a 30 day post discharge medication review.  PMHx includes, but not limited to, WPW Syndrome, hypertension, atrial fibrillation, COPD II, pulmonary fibrosis, type II diabetes mellitus and hyperlipidemia.  Successsful outreach call to Mr. Philpps.  HIPAA identifiers verified.    Subjective: Mr. Nessler reports that his heart rate today is over 130 and he is feels tired.  He states that he saw his cardioloigist yesterday and Imdur was stopped and  Diltiazem was started.  He states that he sees cardiolgoy next Tuesday.  Mr. Polasek reports that he checks his CBGs three times daily with a rang of 100 mg/dL in the morning, 250-300 mg/dL at lunch and ~300 or above at bedtime.    Objective:  HgA1c 8.7% on 07/05/18 down from 9.1% in 02/01/18.   SCr 1.57 mg/dL on 08/07/18, calculated CrCl 48 ml/min, eGFR 46 ml/min/1.73m2  Current Medications: Current Outpatient Medications  Medication Sig Dispense Refill  . ALPRAZolam (XANAX) 0.5 MG tablet Take 1 tablet (0.5 mg total) by mouth at bedtime as needed for anxiety. 30 tablet 0  . apixaban (ELIQUIS) 5 MG TABS tablet Take 5 mg by mouth 2 (two) times daily.    . aspirin 81 MG tablet Take 81 mg by mouth every morning.     . atorvastatin (LIPITOR) 40 MG tablet Take 1 tablet by mouth every night at bedtime 30 tablet 2  . blood glucose meter kit and supplies KIT Dispense based on patient and insurance preference. Use up to four times daily as directed. (FOR ICD E11.65) 1 each 0  . Blood Glucose Monitoring Suppl (ONE TOUCH ULTRA 2) w/Device KIT Use to monitor FSBS 4x daily for fluctuating blood sugars. Dx: E11.65. 1 each 0  . Blood Glucose Monitoring Suppl (ONE TOUCH ULTRA SYSTEM KIT) w/Device KIT Use to monitor FSBS 4x daily for fluctuating blood sugars. Dx: E11.65. 1 each 1  . dapagliflozin  propanediol (FARXIGA) 10 MG TABS tablet Take 10 mg by mouth daily. 30 tablet 0  . diltiazem (CARDIZEM CD) 180 MG 24 hr capsule Take 1 capsule (180 mg total) by mouth daily. 30 capsule 1  . furosemide (LASIX) 20 MG tablet Take 1 tablet (20 mg total) by mouth daily as needed for fluid (fluid). (Patient taking differently: Take 20 mg by mouth daily. ) 90 tablet 2  . insulin aspart (NOVOLOG) 100 UNIT/ML injection 15 UNITS WITH BREAKFAST AND 15 UNITS WITH LUNCH, 20 UNITS WITH SUPPER (Patient taking differently: Inject 15-30 Units into the skin See admin instructions. 15 UNITS WITH BREAKFAST AND 15 UNITS WITH LUNCH, 30 UNITS WITH SUPPER) 60 mL 5  . insulin glargine (LANTUS) 100 UNIT/ML injection Inject 0.6 mLs (60 Units total) into the skin at bedtime. 60 mL 3  . levothyroxine (SYNTHROID, LEVOTHROID) 125 MCG tablet Take 1 tablet (125 mcg total) by mouth daily. 90 tablet 2  . Menthol, Topical Analgesic, (BIOFREEZE EX) Apply 1 application topically as needed (for pain).    . metFORMIN (GLUCOPHAGE) 850 MG tablet Take 1 tablet by mouth twice a day (Patient taking differently: Take 850 mg by mouth 2 (two) times daily with a meal. Take 1 tablet by mouth  twice a day) 180 tablet 2  . metoprolol tartrate (LOPRESSOR) 100 MG tablet Take 1 tablet (100 mg total) by mouth 2 (two) times daily. 180 tablet 3  .   Omega-3 Fatty Acids (FISH OIL PO) Take 2 capsules by mouth 2 (two) times daily.    . ONE TOUCH ULTRA TEST test strip Use to monitor FSBS 4x daily for fluctuating blood sugars. Dx: E11.65. 400 each 3  . ONETOUCH DELICA LANCETS FINE MISC Test up to 4 times a day as directed. 100 each 5  . OXYGEN Inhale 6-8 L into the lungs continuous.    . SYRINGE/NEEDLE, DISP, 1 ML (B-D SYRINGE/NEEDLE 1CC/25GX5/8) 25G X 5/8" 1 ML MISC As directed 100 each 5  . zolpidem (AMBIEN) 10 MG tablet Take 1 tablet by mouth at bedtime. 90 tablet 1  . nitroGLYCERIN (NITROSTAT) 0.4 MG SL tablet Place 1 tablet (0.4 mg total) under the tongue every  5 (five) minutes as needed for chest pain. (Patient not taking: Reported on 08/13/2018) 25 tablet 1  . omega-3 acid ethyl esters (LOVAZA) 1 g capsule Take 2 capsules (2 g total) by mouth 2 (two) times daily. (Patient not taking: Reported on 08/13/2018) 360 capsule 3   No current facility-administered medications for this visit.     Functional Status: No flowsheet data found.  Fall/Depression Screening: Fall Risk  01/07/2016  Falls in the past year? No  Risk for fall due to : Impaired mobility  Risk for fall due to: Comment right knee 'buckles" some times   PHQ 2/9 Scores 07/27/2017 04/20/2016 01/07/2016  PHQ - 2 Score 0 0 0  PHQ- 9 Score 0 - -   ASSESSMENT: Date Discharged from Hospital: 08/09/18 Date Medication Reconciliation Performed: 08/13/2018  Medications Discontinued at Discharge:   Metoprolol XL  New Medications at Discharge:   Metoprolol tartrate  Patient was recently discharged from hospital and all medications have been reviewed  Drugs sorted by system:  Neurologic/Psychologic: alprazolam,   Cardiovascular: apixaban, aspirin 81 mg, atorvastatin, diltiazem, furosemide, metoprolol tartrate, nitroglycerin, omega-3 fish oil  Endocrine: dapagliflozin, insulin glargine, insulin aspart, levothyroxine, metformin  Topical: Biofreeze  Miscellaneous: zolpidem  Gaps in therapy:  Patient with type 2 diabetes mellitus and not an ACEI or ARB for nepho-protection.  He has cough documented from ramipril. Other issues noted:  Watch metformin closely if renal function declines.  He may need a dose reduction.   08/12/18 isosorbide mononitrate discontinued and diltiazem CD 180 mg started, with plans to increase as tolerated.  Medication Assistance: Per financial discussion, Mr. Slayden does not qualify for Extra Help LIS.  He states that he has trouble affording his Lantus, Novolog and Apixaban.  I will outreach to [facility] to determine his TROOP.  He states that if eligible, he will  send back the back application quickly to get assistance for this year.    Lantus will need to have spent $1260 Apixaban will need to have spent $756 Novolog will need to have spent $1000  Plan: Outreach to Mr. Reasons after I hear TROOP from [employer].  Route note to PCP, Dr. Oda Kilts, [place] [phone]

## 2018-08-14 ENCOUNTER — Other Ambulatory Visit: Payer: Self-pay | Admitting: Pharmacy Technician

## 2018-08-14 ENCOUNTER — Ambulatory Visit: Payer: Self-pay

## 2018-08-14 ENCOUNTER — Other Ambulatory Visit: Payer: Self-pay

## 2018-08-14 NOTE — Patient Outreach (Signed)
[facility]) Care Management  [place]   08/14/2018  Jerry Montgomery 1955-11-24 [id number]   62 year old  outreached by [place] services for a 30 day post discharge medication review.  PMHx includes, but not limited to, WPW Syndrome, hypertension, atrial fibrillation, COPD II, pulmonary fibrosis, type II diabetes mellitus and hyperlipidemia.  Successsful outreach call to [name].  HIPAA identifiers verified.   Referral medication(s): Lantus, Eliquis, Novolog Current insurance:HTA  Objective: Allergies  Allergen Reactions  . Niaspan [Niacin] Itching  . Ofev [Nintedanib] Other (See Comments)    Lethargic, wiped out  . Pirfenidone     Other reaction(s): Other (See Comments)  . Ramipril Other (See Comments)    Cough  Other reaction(s): Other (See Comments) Cough    Medications Reviewed Today    Reviewed by Dionne Milo, [facility] (Pharmacist) on 08/13/18 at 1117  Med List Status: <None>  Medication Order Taking? Sig Documenting Provider Last Dose Status Informant  ALPRAZolam (XANAX) 0.5 MG tablet [id number] Yes Take 1 tablet (0.5 mg total) by mouth at bedtime as needed for anxiety. [name], MD Taking Active Self  apixaban (ELIQUIS) 5 MG TABS tablet [id number] Yes Take 5 mg by mouth 2 (two) times daily. [provider] Taking Active Self  aspirin 81 MG tablet [id number] Yes Take 81 mg by mouth every morning.  [provider] Taking Active Self  atorvastatin (LIPITOR) 40 MG tablet [id number] Yes Take 1 tablet by mouth every night at bedtime [name], MD Taking Active Self  blood glucose meter kit and supplies KIT [id number] Yes Dispense based on patient and insurance preference. Use up to four times daily as directed. (FOR ICD E11.65) [name], MD Taking Active Self  Blood Glucose Monitoring Suppl (ONE TOUCH ULTRA 2) w/Device KIT [id number] Yes Use to monitor FSBS 4x daily for fluctuating blood sugars. Dx:  E11.65. [name], MD Taking Active Self  Blood Glucose Monitoring Suppl (ONE TOUCH ULTRA SYSTEM KIT) w/Device KIT [id number] Yes Use to monitor FSBS 4x daily for fluctuating blood sugars. Dx: E11.65. [place], Modena Nunnery, MD Taking Active Self  dapagliflozin propanediol (FARXIGA) 10 MG TABS tablet [id number] Yes Take 10 mg by mouth daily. [name], MD Taking Active Self  diltiazem (CARDIZEM CD) 180 MG 24 hr capsule [id number] Yes Take 1 capsule (180 mg total) by mouth daily. [name], MD Taking Active   furosemide (LASIX) 20 MG tablet [id number] Yes Take 1 tablet (20 mg total) by mouth daily as needed for fluid (fluid).  Patient taking differently:  Take 20 mg by mouth daily.    [name], MD Taking Active Self  insulin aspart (NOVOLOG) 100 UNIT/ML injection [id number] Yes 15 UNITS WITH BREAKFAST AND 15 UNITS WITH LUNCH, 20 UNITS WITH SUPPER  Patient taking differently:  Inject 15-30 Units into the skin See admin instructions. 15 UNITS WITH BREAKFAST AND 15 UNITS WITH LUNCH, 30 UNITS WITH SUPPER   [name], MD Taking Active Self  insulin glargine (LANTUS) 100 UNIT/ML injection [id number] Yes Inject 0.6 mLs (60 Units total) into the skin at bedtime. [name], MD Taking Active Self  levothyroxine (SYNTHROID, LEVOTHROID) 125 MCG tablet [id number] Yes Take 1 tablet (125 mcg total) by mouth daily. [name], MD Taking Active Self  Menthol, Topical Analgesic, [facility]) [id number] Yes Apply 1 application topically as needed (for pain). [provider] Taking Active Self  metFORMIN (GLUCOPHAGE) 850 MG tablet [id number]  Yes Take 1 tablet by mouth twice a day  Patient taking differently:  Take 850 mg by mouth 2 (two) times daily with a meal. Take 1 tablet by mouth  twice a day   [name], MD Taking Active Self  metoprolol tartrate (LOPRESSOR) 100 MG tablet [id number] Yes Take 1 tablet (100 mg total) by mouth 2 (two) times daily. Daune Perch, NP Taking Active   nitroGLYCERIN (NITROSTAT) 0.4 MG SL tablet [id number] No Place 1 tablet (0.4 mg total) under the tongue every 5 (five) minutes as needed for chest pain.  Patient not taking:  Reported on 08/13/2018   [name], MD Not Taking Active   omega-3 acid ethyl esters (LOVAZA) 1 g capsule [id number] No Take 2 capsules (2 g total) by mouth 2 (two) times daily.  Patient not taking:  Reported on 08/13/2018   Troy Sine, MD Not Taking Active Self  Omega-3 Fatty Acids (FISH OIL PO) [id number] Yes Take 2 capsules by mouth 2 (two) times daily. [provider] Taking Active Self  ONE TOUCH ULTRA TEST test strip [id number] Yes Use to monitor FSBS 4x daily for fluctuating blood sugars. Dx: E11.65. [name], MD Taking Active Self  Jonetta Speak LANCETS FINE [place] [id number] Yes Test up to 4 times a day as directed. [name], MD Taking Active Self  OXYGEN [id number] Yes Inhale 6-8 L into the lungs continuous. [provider] Taking Active Self  SYRINGE/NEEDLE, DISP, 1 ML (B-D SYRINGE/NEEDLE 1CC/25GX5/8) 25G X 5/8" 1 ML MISC [id number] Yes As directed [name], MD Taking Active Self  zolpidem (AMBIEN) 10 MG tablet [id number] Yes Take 1 tablet by mouth at bedtime. [name], MD Taking Active Self          Assessment: Medication Review completed 08/13/18  Medication Assistance Findings:  Extra Help:   _0  Already receiving Full Extra Help  _1  Already receiving Partial Extra Help  _2  Eligible based on reported income and assets  _3  Not Eligible based on reported income and assets  Patient Assistance Programs: 1) Eliquis made by [employer] o Income requirement met: _4  Yes _5  No _6  Unknown o Out-of-pocket prescription expenditure met:    _7  Yes _8  No  _9  Unknown  <XYIAXKPVVZSMOLMB>_8<\/MLJQGBEEFEOFHQRF>_75  Not applicable TROOPof $8832.54        2)  Lantus made by [employer] o Income requirement met: _11  Yes _12  No  _13  Unknown o Out-of-pocket prescription  expenditure met:   _14  Yes _15  No   _16  Unknown <DIYMEBRAXENMMHWK>_0<\/SUPJSRPRXYVOPFYT>_24  Not applicable  Can't apply for Novolog because Novo Nordisk's assistance program closes on 11/30.  There will not be enough time to get the application submitted.   Plan: I will route patient assistance letter to [place] technician who will coordinate patient assistance program application process for medications listed above.  [facility] pharmacy technician will assist with obtaining all required documents from both patient and provider(s) and submit application(s) once completed.    Joetta Manners, PharmD Clinical Pharmacist [place] [phone]

## 2018-08-14 NOTE — Patient Outreach (Signed)
[facility]) Care Management  08/14/2018  Jerry Montgomery 09/19/1956 [id number]   Received [name] (Eliquis) and [employer] (Lantus) patient assistance referral from [facility] [employer]. Prepared patient portions to be mailed and faxed provider portions to Dr. Tanya Nones.   Will follow up with patient in 5-7 business days to confirm applications have been received.  [name] [name] [facility] Care Management [phone]

## 2018-08-16 ENCOUNTER — Telehealth: Payer: Self-pay | Admitting: Family Medicine

## 2018-08-16 NOTE — Telephone Encounter (Signed)
Call placed to patient he is aware of provider recommendations 

## 2018-08-16 NOTE — Telephone Encounter (Signed)
No that sounds much better.  Stay where he is.

## 2018-08-16 NOTE — Telephone Encounter (Signed)
Pt called and states that his BP has been 124/77 with pulse 58 and 133/80 - 69 and wanted to know if you wanted him to stay on current treatment or go back to what he was taking before?

## 2018-08-20 ENCOUNTER — Encounter: Payer: Self-pay | Admitting: Cardiology

## 2018-08-20 ENCOUNTER — Ambulatory Visit: Payer: PPO | Admitting: Cardiology

## 2018-08-20 VITALS — BP 122/70 | HR 70 | Ht 73.0 in | Wt 250.0 lb

## 2018-08-20 DIAGNOSIS — J8410 Pulmonary fibrosis, unspecified: Secondary | ICD-10-CM | POA: Diagnosis not present

## 2018-08-20 DIAGNOSIS — Z9861 Coronary angioplasty status: Secondary | ICD-10-CM | POA: Diagnosis not present

## 2018-08-20 DIAGNOSIS — Z8679 Personal history of other diseases of the circulatory system: Secondary | ICD-10-CM

## 2018-08-20 DIAGNOSIS — I2510 Atherosclerotic heart disease of native coronary artery without angina pectoris: Secondary | ICD-10-CM

## 2018-08-20 DIAGNOSIS — Z7901 Long term (current) use of anticoagulants: Secondary | ICD-10-CM

## 2018-08-20 DIAGNOSIS — I4891 Unspecified atrial fibrillation: Secondary | ICD-10-CM | POA: Diagnosis not present

## 2018-08-20 DIAGNOSIS — I10 Essential (primary) hypertension: Secondary | ICD-10-CM

## 2018-08-20 DIAGNOSIS — J849 Interstitial pulmonary disease, unspecified: Secondary | ICD-10-CM

## 2018-08-20 DIAGNOSIS — H903 Sensorineural hearing loss, bilateral: Secondary | ICD-10-CM

## 2018-08-20 DIAGNOSIS — I480 Paroxysmal atrial fibrillation: Secondary | ICD-10-CM | POA: Diagnosis not present

## 2018-08-20 DIAGNOSIS — N183 Chronic kidney disease, stage 3 unspecified: Secondary | ICD-10-CM

## 2018-08-20 NOTE — Progress Notes (Signed)
2020/08/918 [place]   [birth year]  [id number]  Primary Physician Dennard Schaumann Cammie Mcgee, MD Primary Cardiologist: Dr Kelly/ Dr Alecia Lemming  HPI:  Pleasant 62 y.o.malewith a hx of atrial fibrillation in Sept 2019, placed on Eliquis by his PCP, CADs/pPCI of proximal LAD and proximal circumflexin 2004. The patient had anew CFX lesion in 2008 treated with PCI and ISR CFX 2011 treated with PCI. Other medical issues include,hyperlipidemia, hypertension, DM 2, diastolic SVX(7939), history of WPW s/p RFA followed by Dr. Doylene Canard COPD with pulmonary fibrosis on home O2 followed by Dr. Chase Caller. He has been taking an experimental drug for his pulmonary fibrosis. He has had issues with PAF since. He presented to the ED 07/11/18 after his KARDIA app noted frequent AF with RVR and the patient noted fatigue and SOB. In the ED he was noted to be in NSR. He had CHF on his CXR. It was suggested he increase his Lasix to 20 mg daily and his Toprol to 100 mg daily with an extra 50 mg Q pm for sustained HR > 130.  He was seen in the office 07/23/18 for follow up and was doing OK. He was in NSR.  He still noticed his HR > 130 but only for short periods, he was otherwise unaware of any tachycardia.    He was readmitted 08/07/18 with PAF with RVR when it was noted his HR was 130 while he was getting his pulmonary fibrosis experimental drug infusion as an OP. The patient tells me he could not tell he was in AF with RVR but he was apparently in AF with RVR before the infusion -per the pt's history. He was seen in consult by Dr Curt Bears. He is not felt to be a candidate for antiarrythmic drug therapy and rate control is recommended.  He was discharged 08/09/18 (he had become frustrated with [place], he felt like nothing was being done). His metoprolol was increased to 100 mg BID (it had been decreased in the past secondary to bradycardia and fatigue with a HR in the 50s). He called Dr Dennard Schaumann on 10/22 to relate his HR was  still elevated on his KARDI app and Dr Dennard Schaumann added Diltiazem 180 mg. He is in the office today for follow up. He is in NSR at 70. Since he was placed on Diltiazem his HR has not gone above 100.    Current Outpatient Medications  Medication Sig Dispense Refill  . ALPRAZolam (XANAX) 0.5 MG tablet Take 1 tablet (0.5 mg total) by mouth at bedtime as needed for anxiety. 30 tablet 0  . apixaban (ELIQUIS) 5 MG TABS tablet Take 5 mg by mouth 2 (two) times daily.    Marland Kitchen aspirin 81 MG tablet Take 81 mg by mouth every morning.     Marland Kitchen atorvastatin (LIPITOR) 40 MG tablet Take 1 tablet by mouth every night at bedtime 30 tablet 2  . blood glucose meter kit and supplies KIT Dispense based on patient and insurance preference. Use up to four times daily as directed. (FOR ICD E11.65) 1 each 0  . Blood Glucose Monitoring Suppl (ONE TOUCH ULTRA 2) w/Device KIT Use to monitor FSBS 4x daily for fluctuating blood sugars. Dx: E11.65. 1 each 0  . Blood Glucose Monitoring Suppl (ONE TOUCH ULTRA SYSTEM KIT) w/Device KIT Use to monitor FSBS 4x daily for fluctuating blood sugars. Dx: E11.65. 1 each 1  . dapagliflozin propanediol (FARXIGA) 10 MG TABS tablet Take 10 mg by mouth daily. 30 tablet 0  .  diltiazem (CARDIZEM CD) 180 MG 24 hr capsule Take 1 capsule (180 mg total) by mouth daily. 30 capsule 1  . furosemide (LASIX) 20 MG tablet Take 1 tablet (20 mg total) by mouth daily as needed for fluid (fluid). (Patient taking differently: Take 20 mg by mouth daily. ) 90 tablet 2  . insulin aspart (NOVOLOG) 100 UNIT/ML injection 15 UNITS WITH BREAKFAST AND 15 UNITS WITH LUNCH, 20 UNITS WITH SUPPER (Patient taking differently: Inject 15-30 Units into the skin See admin instructions. 15 UNITS WITH BREAKFAST AND 15 UNITS WITH LUNCH, 30 UNITS WITH SUPPER) 60 mL 5  . insulin glargine (LANTUS) 100 UNIT/ML injection Inject 0.6 mLs (60 Units total) into the skin at bedtime. 60 mL 3  . levothyroxine (SYNTHROID, LEVOTHROID) 125 MCG tablet Take 1  tablet (125 mcg total) by mouth daily. 90 tablet 2  . Menthol, Topical Analgesic, (BIOFREEZE EX) Apply 1 application topically as needed (for pain).    . metFORMIN (GLUCOPHAGE) 850 MG tablet Take 1 tablet by mouth twice a day (Patient taking differently: Take 850 mg by mouth 2 (two) times daily with a meal. Take 1 tablet by mouth  twice a day) 180 tablet 2  . metoprolol tartrate (LOPRESSOR) 100 MG tablet Take 1 tablet (100 mg total) by mouth 2 (two) times daily. 180 tablet 3  . nitroGLYCERIN (NITROSTAT) 0.4 MG SL tablet Place 1 tablet (0.4 mg total) under the tongue every 5 (five) minutes as needed for chest pain. (Patient not taking: Reported on 08/13/2018) 25 tablet 1  . omega-3 acid ethyl esters (LOVAZA) 1 g capsule Take 2 capsules (2 g total) by mouth 2 (two) times daily. (Patient not taking: Reported on 08/13/2018) 360 capsule 3  . Omega-3 Fatty Acids (FISH OIL PO) Take 2 capsules by mouth 2 (two) times daily.    . ONE TOUCH ULTRA TEST test strip Use to monitor FSBS 4x daily for fluctuating blood sugars. Dx: E11.65. 400 each 3  . ONETOUCH DELICA LANCETS FINE MISC Test up to 4 times a day as directed. 100 each 5  . OXYGEN Inhale 6-8 L into the lungs continuous.    . SYRINGE/NEEDLE, DISP, 1 ML (B-D SYRINGE/NEEDLE 1CC/25GX5/8) 25G X 5/8" 1 ML MISC As directed 100 each 5  . zolpidem (AMBIEN) 10 MG tablet Take 1 tablet by mouth at bedtime. 90 tablet 1   No current facility-administered medications for this visit.     Allergies  Allergen Reactions  . Niaspan [Niacin] Itching  . Ofev [Nintedanib] Other (See Comments)    Lethargic, wiped out  . Pirfenidone     Other reaction(s): Other (See Comments)  . Ramipril Other (See Comments)    Cough  Other reaction(s): Other (See Comments) Cough    Past Medical History:  Diagnosis Date  . Acute diastolic heart failure ([place])   . Atrial fibrillation ([place])   . Atrial tachycardia ([place])   . CAD (coronary artery disease)   . COPD (chronic  obstructive pulmonary disease) ([place])   . Hyperlipidemia   . Hypertension   . Hypothyroidism   . On home oxygen therapy    "3L; 24/7" (04/15/2015)  . Pneumothorax on right 4/11  . Postinflammatory pulmonary fibrosis ([place])   . Sleep apnea    "won't use CPAP" (04/14/2105)  . SVT (supraventricular tachycardia) ([place])    Per Dr. Tanna Furry note  . Type II diabetes mellitus ([place])   . WPW (Wolff-Parkinson-White syndrome)    Per Dr. Tanna Furry Note    Social History  Socioeconomic History  . Marital status: Single    Spouse name: Not on file  . Number of children: 1  . Years of education: Not on file  . Highest education level: Not on file  Occupational History    Employer: [employer]  . Financial resource strain: Not on file  . Food insecurity:    Worry: Not on file    Inability: Not on file  . Transportation needs:    Medical: Not on file    Non-medical: Not on file  Tobacco Use  . Smoking status: Former Smoker    Packs/day: 1.50    Years: 35.00    Pack years: 52.50    Types: Cigarettes    Last attempt to quit: 10/24/2007    Years since quitting: 10.8  . Smokeless tobacco: Never Used  Substance and Sexual Activity  . Alcohol use: Yes    Alcohol/week: 0.0 standard drinks    Comment: 04/15/2015 "I've had a couple drinks in the last 8 months"  . Drug use: No  . Sexual activity: Not Currently  Lifestyle  . Physical activity:    Days per week: Not on file    Minutes per session: Not on file  . Stress: Not on file  Relationships  . Social connections:    Talks on phone: Not on file    Gets together: Not on file    Attends religious service: Not on file    Active member of club or organization: Not on file    Attends meetings of clubs or organizations: Not on file    Relationship status: Not on file  . Intimate partner violence:    Fear of current or ex partner: Not on file    Emotionally abused: Not on file    Physically abused: Not on file    Forced  sexual activity: Not on file  Other Topics Concern  . Not on file  Social History Narrative  . Not on file     Family History  Problem Relation Age of Onset  . Breast cancer Mother   . Diabetes type II Mother   . Lung cancer Father      Review of Systems: General: negative for chills, fever, night sweats or weight changes.  Cardiovascular: negative for chest pain, edema, orthopnea, palpitations, paroxysmal nocturnal dyspnea  Dermatological: negative for rash Respiratory: negative for cough or wheezing Urologic: negative for hematuria Abdominal: negative for nausea, vomiting, diarrhea, bright red blood per rectum, melena, or hematemesis Neurologic: negative for visual changes, syncope, or dizziness All other systems reviewed and are otherwise negative except as noted above.    Height _0  (1.854 m), weight 250 lb (113.4 kg).  General appearance: alert, cooperative, no distress and HOH, on O2 Lungs: decreased breath sounds  Heart: regular rate and rhythm and extra systole noted Extremities: no edema Skin: cool and dry Neurologic: Grossly normal  EKG NSR, LAD, QTc 483, inferior Qs  ASSESSMENT AND PLAN:   Atrial fibrillation with RVR Recent onset though this has been noted in 2016 as well. He is now on increased Metoprolol 100 mg BID and Diltiazem CD 180 mg daily and tolerating this well- in NSR 70.  Pulmonary fibrosis Followed by [facility], Dr Chase Caller, he is on chronic O2 and is currently taking an experimental drug infusion  Insulin dependent diabetes mellitus ([place]) Type 2 IDDM  Essential hypertension Controlled  WPW syndrome S/P RFA-Dr Lovena Le follows  Chronic anticoagulation CHADS VASC=4, he is  on Eliquis  PLAN  Same Rx. He has an appointment with Dr Claiborne Billings in Jan and will keep that.   Kerin Ransom PA-C 02-20-202019 1:18 PM

## 2018-08-20 NOTE — Patient Instructions (Signed)
Medication Instructions:  Your physician recommends that you continue on your current medications as directed. Please refer to the Current Medication list given to you today. If you need a refill on your cardiac medications before your next appointment, please call your pharmacy.   Lab work: None  If you have labs (blood work) drawn today and your tests are completely normal, you will receive your results only by: . MyChart Message (if you have MyChart) OR . A paper copy in the mail If you have any lab test that is abnormal or we need to change your treatment, we will call you to review the results.  Testing/Procedures: None   Follow-Up: At [facility], you and your health needs are our priority.  As part of our continuing mission to provide you with exceptional heart care, we have created designated Provider Care Teams.  These Care Teams include your primary Cardiologist (physician) and Advanced Practice Providers (APPs -  Physician Assistants and Nurse Practitioners) who all work together to provide you with the care you need, when you need it. FOLLOW UP AS SCHEDULED  Any Other Special Instructions Will Be Listed Below (If Applicable).   

## 2018-08-26 ENCOUNTER — Encounter: Payer: PPO | Admitting: *Deleted

## 2018-08-26 ENCOUNTER — Ambulatory Visit: Payer: Self-pay | Admitting: Pharmacy Technician

## 2018-08-26 ENCOUNTER — Ambulatory Visit (HOSPITAL_COMMUNITY)
Admission: RE | Admit: 2018-08-26 | Discharge: 2018-08-26 | Disposition: A | Discharge status: Home / Self Care | Payer: PPO | Source: Ambulatory Visit | Attending: Internal Medicine | Admitting: Internal Medicine

## 2018-08-26 DIAGNOSIS — Z006 Encounter for examination for normal comparison and control in clinical research program: Secondary | ICD-10-CM

## 2018-08-26 DIAGNOSIS — J84112 Idiopathic pulmonary fibrosis: Secondary | ICD-10-CM

## 2018-08-26 MED ORDER — STUDY - FIBROGEN - PAMREVLUMAB OR PLACEBO 10 MG/ML IV INFUSION (PI-RAMASWAMY)
30.0000 mg/kg | Freq: Once | INTRAVENOUS | Status: AC
  Administered 2018-08-26: 3350 mg via INTRAVENOUS
  Filled 2018-08-26: qty 350

## 2018-08-26 NOTE — Research (Signed)
Title: FGCL-3019-091 (FibroGen Study) is a Phase 3, randomized, double-blind, placebo-controlled multicenter international study to evaluate evaluate the efficacy and safety of 30 mg/kg IV infusions of pamrevlumab administered every 3 weeks for 52 weeks as compared to placebo in subjects with Idiopathic Pulmonary Fibrosis. Primary end point is: change in FVC from baseline at week 52  Protocol #: [id number]-[id number], Clinical Trials #: [id number] Sponsor: [email or web address]  [facility] [place], [place], [place])  Protocol Version for 08/26/2018 date Amendment 1.0 17MAR2019 Consent Version for 08/26/2018 date version 2.1 [id number] Investigator Brochure Version for 08/26/2018 date v17 25OCT2018   Key Features of Pamrevlumab (FG-3019) the study drug: a recombinant fully human IgG kappa monoclonal antibody that binds to CTGF and is being developed for treatment of diseases in which tissue fibrosis has a major pathogenic role. In particular, pamrevlumab appears to disrupt a CTGF autocrine loop in mesenchymal cells like myofibroblasts that reduces their recruitment of leukocytes like macrophages, mast and dendritic cells via chemokine secretion. This disruption results in collapse of the cellular crosstalk that drives tissue remodeling.  Key Inclusion Criteria:  Age 65 to 73 years Diagnosis of IPF within the past 5 years  Interstitial pulmonary fibrosis defined by HRCT scan at Screening, with evidence of ?10% to <50% parenchymal fibrosis and <25% honeycombing, within the whole lung. Not currently receiving treatment for IPF with approved therapy. a. FVC value ?50% and ?90% of predicted at screening b. DLCO percent of predicted and corrected by Hb value ?30% and ?90% at screening The extent of fibrosis is greater than the extent of emphysema on HRCT. Male subjects with partners of childbearing potential and male subjects of childbearing potential (including those <1 year postmenopausal) must use double barrier contraception  methods during conduct of study, and for 3 months after last dose of study drug.   Key Exclusion Criteria  Ongoing acute IPF exacerbation, or suspicion of such process, during Screening or Randomization Interstitial lung disease other than IPF; Poorly controlled chronic heart failure; clinical diagnosis of cor pulmonale requiring specific treatment; or severe pulmonary arterial hypertension Smoking within 3 months of Screening and/or unwilling to avoid smoking throughout the study. Use of any investigational drugs, for IPF or not, in the 30 days prior to screening initiation. Or use of approved IPF therapies within 5 half-lives of screening. High likelihood of lung transplantation within 6 months after Day 1. Any history of malignancy likely to result in significant disability or mortality likely to require significant medical or surgical intervention within the next 2 years. This does not include minor surgical procedures for localized cancer. Previous exposure to pamrevlumab. Daily use of PDE-5 inhibitor drugs [e.g. sildenafil, tadalafil, other]. (Note: Intermittent use of one type for erectile dysfunction or severe pulmonary hypertension is allowed).    Clinical Research Coordinator / Research RN note : This visit for [name] with DOB: 02-Feb-1956 on 08/26/2018 for the above protocol is Visit/Encounter # Week 12  and is for purpose of Research . The consent for this encounter is currently IRB approved. Subject expressed continued interest and consent in continuing as a study subject. Subject confirmed that there was NO change in contact information (e.g. address, telephone, email). Subject thanked for participation in research and contribution to science.   In this visit 08/26/2018 the subject will be evaluated by investigator named Dr. Kalman Shan via video conference  . This research coordinator has verified that the investigator is up to date with his training. The subject  returned to the [facility] day center for  his week 12 infusion. The subject presented today in NSR and pre dose vitals confirmed the subject is not currently in Afib. Since last visit the subject was kept in the hospital for observation for Atrial flutter/fibrilation. Subject Left the hospital AMA after approximately 2 and 1/2 days. Subject remained in Afib with Hrs ranging from 130s to 160s. The subject then went to see his GP on 21OCT2019 due to remaining in that state. During that visit it was recommended by Dr. Gilda Crease that the subject discontinue Isosorbide and replace with Cardixem CD 180mg  PO Daily. The Subject states aproximately 6 hours post drug change the subject when back into normal rhythm and heart rate went from 131 down to 67 per Kardia heart rate monitor. The subject returned to GP on 29OCT2019 at which subject had EKG which showed NSR and HR of 70. Subject has been in NSR and has had no significant increases in HR since medication change on 21OCT2019. The subject had all pre dose assessments completed as per above stated protocol. IP was administered beginning at 09:52 and was completed at  10:50. All IP was given. Nurses Nicholaus Corolla. And Mauro Kaufmann. Were part of the care team for the subjects visit. [name] was the Pharmacy technician responsible for IP prep. The subject was discharged at 12:10 with no complaints. For further details regarding today's visit please refer to the subjects paper source binder.   Signed by  [employer] BS. CPhT [facility]  Clinical Research Coordinator I [place], [place] 14:34 08/26/2018

## 2018-08-27 ENCOUNTER — Other Ambulatory Visit: Payer: Self-pay | Admitting: Pharmacy Technician

## 2018-08-27 NOTE — Patient Outreach (Signed)
[facility]) Care Management  08/27/2018  CHENEY EWART [birth year] [id number]   Successful call to Mr. Twyman, HIPAA identifiers verified. Patient confirmed that he received his patient assistance applications and have already mailed them back in.  Once documents have been received, will fax them into designated companies.  [name] Effie Shy CPhT Certified Pharmacy Technician [facility] Care Management Direct Dial:[phone]

## 2018-08-28 ENCOUNTER — Encounter (HOSPITAL_COMMUNITY): Payer: PPO

## 2018-09-03 ENCOUNTER — Other Ambulatory Visit: Payer: Self-pay | Admitting: Family Medicine

## 2018-09-03 NOTE — Telephone Encounter (Signed)
Ok to refill Ambien??  Last office visit 08/12/2018.  Last refill 04/08/2018.

## 2018-09-09 DIAGNOSIS — J8410 Pulmonary fibrosis, unspecified: Secondary | ICD-10-CM | POA: Diagnosis not present

## 2018-09-09 DIAGNOSIS — J449 Chronic obstructive pulmonary disease, unspecified: Secondary | ICD-10-CM | POA: Diagnosis not present

## 2018-09-09 DIAGNOSIS — J438 Other emphysema: Secondary | ICD-10-CM | POA: Diagnosis not present

## 2018-09-09 DIAGNOSIS — R269 Unspecified abnormalities of gait and mobility: Secondary | ICD-10-CM | POA: Diagnosis not present

## 2018-09-17 ENCOUNTER — Encounter: Payer: PPO | Admitting: *Deleted

## 2018-09-17 ENCOUNTER — Ambulatory Visit (HOSPITAL_COMMUNITY)
Admission: RE | Admit: 2018-09-17 | Discharge: 2018-09-17 | Disposition: A | Discharge status: Home / Self Care | Payer: PPO | Source: Ambulatory Visit | Attending: Internal Medicine | Admitting: Internal Medicine

## 2018-09-17 DIAGNOSIS — J84112 Idiopathic pulmonary fibrosis: Secondary | ICD-10-CM | POA: Insufficient documentation

## 2018-09-17 DIAGNOSIS — Z006 Encounter for examination for normal comparison and control in clinical research program: Secondary | ICD-10-CM

## 2018-09-17 LAB — IFOBT (OCCULT BLOOD): IFOBT: POSITIVE

## 2018-09-17 MED ORDER — STUDY - FIBROGEN - PAMREVLUMAB OR PLACEBO 10 MG/ML IV INFUSION (PI-RAMASWAMY)
30.0000 mg/kg | Freq: Once | INTRAVENOUS | Status: DC
  Filled 2018-09-17: qty 329

## 2018-09-17 MED ORDER — STUDY - FIBROGEN - PAMREVLUMAB OR PLACEBO 10 MG/ML IV INFUSION (PI-RAMASWAMY)
30.0000 mg/kg | Freq: Once | INTRAVENOUS | Status: AC
  Administered 2018-09-17: 3350 mg via INTRAVENOUS
  Filled 2018-09-17: qty 335

## 2018-09-17 NOTE — Research (Addendum)
Title: FGCL-3019-091 (FibroGen Study) is a Phase 3, randomized, double-blind, placebo-controlled multicenter international study to evaluate evaluate the efficacy and safety of 30 mg/kg IV infusions of pamrevlumab administered every 3 weeks for 52 weeks as compared to placebo in subjects with Idiopathic Pulmonary Fibrosis. Primary end point is: change in FVC from baseline at week 52  Protocol #: FGCL-3019-091, Clinical Trials #: [id number] Sponsor: [email or web address]  [facility]([place]  Protocol Version for 09/17/2018 date of  Amendment 2.0 17 Jul 2018 Consent Version for 09/17/2018 date of [facility] IRB Version 29OCT2019  Investigator Brochure Version for 09/17/2018 date of v17 25OCT2018  Clinical Research Coordinator / Research RN note : This visit for [name] with DOB: 11/19/1955 on 09/17/2018 for the above protocol is Visit/Encounter # week 15 and is for purpose of research . The consent for this encounter is under Protocol Version amendment 2.0 and  is currently IRB approved. Subject expressed continued interest and consent in continuing as a study subject. Subject confirmed that there was  no change in contact information (e.g. address, telephone, email). Subject thanked for participation in research and contribution to science.   In this visit 09/17/2018 Prior to any study procedures being carried out the subject was reconsented to updated [employer] IRB version 29OCT2019 ICF document. Prior to obtaining his consent the subject was was made aware of the protocol changes and changes listed in the ICF. The subject verbalized understanding of information provided and signed consent at 08:27am. The PI Dr. Marchelle Montgomery was present during the entire re consenting process. The subject presents today and reports no changes in any medications since last review. He reports no occurrences of Afib. He also reports that his Heart rate has remained stable within ranges of low 50s to mid 60s. The  subject reported no other issues. All pre infusion assessments were completed per above mentioned protocol. Danelle EarthlyMichelle Porter CPhT was pharmacy personnel responsible for today's IP prep. Nurse on Hand for today's visit was HoneywellKristen R. IP was infused in its entirety over one hour. Post observation period of 1 hour was completed subject had no complaints or problems. subject was discharged at 11:42 with no problems. For further information regarding today's study visit please refer to subjects paper source binder.   Signed by  [name] BS, CCRC, CPhT Clinical Research Coordinator [facility]  [place], [place] 12:55pm 09/17/2018

## 2018-09-22 ENCOUNTER — Other Ambulatory Visit: Payer: Self-pay | Admitting: Family Medicine

## 2018-09-30 ENCOUNTER — Telehealth: Payer: Self-pay | Admitting: Family Medicine

## 2018-09-30 NOTE — Telephone Encounter (Signed)
Patient's wife called in today requesting and appointment with Dr. Tanya NonesPickard. She stated that patient's blood pressure was 95/70 and his pulse was 120. She informed me that patient had already taken his metoprolol for today. I advised patient's wife to have patient water and elevate legs and if blood pressure remained low and patient pulse did not return to normal to go to the urgent care or ER. Patient's wife verbalized understanding. Patient was given an appointment for tomorrow but advised to go to ER or call 911 if symptoms persist.

## 2018-10-01 ENCOUNTER — Observation Stay (HOSPITAL_COMMUNITY)
Admission: EM | Admit: 2018-10-01 | Discharge: 2018-10-02 | Disposition: A | Discharge status: Home / Self Care | Payer: PPO | Attending: Cardiology | Admitting: Cardiology

## 2018-10-01 ENCOUNTER — Encounter: Payer: Self-pay | Admitting: Family Medicine

## 2018-10-01 ENCOUNTER — Encounter (HOSPITAL_COMMUNITY): Payer: Self-pay

## 2018-10-01 ENCOUNTER — Other Ambulatory Visit: Payer: Self-pay

## 2018-10-01 ENCOUNTER — Ambulatory Visit (INDEPENDENT_AMBULATORY_CARE_PROVIDER_SITE_OTHER): Payer: PPO | Admitting: Family Medicine

## 2018-10-01 ENCOUNTER — Emergency Department (HOSPITAL_COMMUNITY): Payer: PPO

## 2018-10-01 VITALS — BP 90/70 | HR 139 | Temp 98.0°F | Resp 20 | Ht 73.0 in | Wt 247.0 lb

## 2018-10-01 DIAGNOSIS — I429 Cardiomyopathy, unspecified: Secondary | ICD-10-CM | POA: Insufficient documentation

## 2018-10-01 DIAGNOSIS — I2510 Atherosclerotic heart disease of native coronary artery without angina pectoris: Secondary | ICD-10-CM | POA: Diagnosis not present

## 2018-10-01 DIAGNOSIS — Z794 Long term (current) use of insulin: Secondary | ICD-10-CM | POA: Diagnosis not present

## 2018-10-01 DIAGNOSIS — G4733 Obstructive sleep apnea (adult) (pediatric): Secondary | ICD-10-CM | POA: Diagnosis not present

## 2018-10-01 DIAGNOSIS — R079 Chest pain, unspecified: Secondary | ICD-10-CM | POA: Diagnosis not present

## 2018-10-01 DIAGNOSIS — Z7901 Long term (current) use of anticoagulants: Secondary | ICD-10-CM | POA: Insufficient documentation

## 2018-10-01 DIAGNOSIS — I952 Hypotension due to drugs: Secondary | ICD-10-CM | POA: Diagnosis not present

## 2018-10-01 DIAGNOSIS — Z9981 Dependence on supplemental oxygen: Secondary | ICD-10-CM | POA: Insufficient documentation

## 2018-10-01 DIAGNOSIS — I456 Pre-excitation syndrome: Secondary | ICD-10-CM | POA: Insufficient documentation

## 2018-10-01 DIAGNOSIS — I5031 Acute diastolic (congestive) heart failure: Secondary | ICD-10-CM | POA: Diagnosis not present

## 2018-10-01 DIAGNOSIS — J449 Chronic obstructive pulmonary disease, unspecified: Secondary | ICD-10-CM | POA: Diagnosis not present

## 2018-10-01 DIAGNOSIS — Z955 Presence of coronary angioplasty implant and graft: Secondary | ICD-10-CM | POA: Diagnosis not present

## 2018-10-01 DIAGNOSIS — I4892 Unspecified atrial flutter: Principal | ICD-10-CM | POA: Insufficient documentation

## 2018-10-01 DIAGNOSIS — I130 Hypertensive heart and chronic kidney disease with heart failure and stage 1 through stage 4 chronic kidney disease, or unspecified chronic kidney disease: Secondary | ICD-10-CM | POA: Insufficient documentation

## 2018-10-01 DIAGNOSIS — I484 Atypical atrial flutter: Secondary | ICD-10-CM

## 2018-10-01 DIAGNOSIS — E669 Obesity, unspecified: Secondary | ICD-10-CM | POA: Diagnosis not present

## 2018-10-01 DIAGNOSIS — J9611 Chronic respiratory failure with hypoxia: Secondary | ICD-10-CM | POA: Diagnosis not present

## 2018-10-01 DIAGNOSIS — E785 Hyperlipidemia, unspecified: Secondary | ICD-10-CM | POA: Diagnosis not present

## 2018-10-01 DIAGNOSIS — N183 Chronic kidney disease, stage 3 (moderate): Secondary | ICD-10-CM | POA: Insufficient documentation

## 2018-10-01 DIAGNOSIS — Z79890 Hormone replacement therapy (postmenopausal): Secondary | ICD-10-CM | POA: Diagnosis not present

## 2018-10-01 DIAGNOSIS — J84112 Idiopathic pulmonary fibrosis: Secondary | ICD-10-CM | POA: Insufficient documentation

## 2018-10-01 DIAGNOSIS — Z87891 Personal history of nicotine dependence: Secondary | ICD-10-CM | POA: Insufficient documentation

## 2018-10-01 DIAGNOSIS — E1122 Type 2 diabetes mellitus with diabetic chronic kidney disease: Secondary | ICD-10-CM | POA: Diagnosis not present

## 2018-10-01 DIAGNOSIS — R0602 Shortness of breath: Secondary | ICD-10-CM | POA: Diagnosis not present

## 2018-10-01 DIAGNOSIS — Z7982 Long term (current) use of aspirin: Secondary | ICD-10-CM | POA: Insufficient documentation

## 2018-10-01 DIAGNOSIS — Z79899 Other long term (current) drug therapy: Secondary | ICD-10-CM | POA: Diagnosis not present

## 2018-10-01 DIAGNOSIS — E039 Hypothyroidism, unspecified: Secondary | ICD-10-CM | POA: Diagnosis not present

## 2018-10-01 DIAGNOSIS — I4891 Unspecified atrial fibrillation: Secondary | ICD-10-CM

## 2018-10-01 HISTORY — DX: Chronic kidney disease, stage 3 (moderate): N18.3

## 2018-10-01 HISTORY — DX: Dependence on supplemental oxygen: Z99.81

## 2018-10-01 HISTORY — DX: Cardiomyopathy, unspecified: I42.9

## 2018-10-01 HISTORY — DX: Chronic kidney disease, stage 3 unspecified: N18.30

## 2018-10-01 HISTORY — DX: Idiopathic pulmonary fibrosis: J84.112

## 2018-10-01 HISTORY — DX: Chronic respiratory failure with hypoxia: J96.11

## 2018-10-01 HISTORY — DX: Emphysema, unspecified: J43.9

## 2018-10-01 HISTORY — DX: Unspecified atrial flutter: I48.92

## 2018-10-01 LAB — CBC WITH DIFFERENTIAL/PLATELET
ABS IMMATURE GRANULOCYTES: 0.13 10*3/uL — AB (ref 0.00–0.07)
Basophils Absolute: 0.1 10*3/uL (ref 0.0–0.1)
Basophils Relative: 1 %
Eosinophils Absolute: 0.3 10*3/uL (ref 0.0–0.5)
Eosinophils Relative: 2 %
HCT: 50.9 % (ref 39.0–52.0)
HEMOGLOBIN: 15.5 g/dL (ref 13.0–17.0)
IMMATURE GRANULOCYTES: 1 %
LYMPHS ABS: 1.6 10*3/uL (ref 0.7–4.0)
LYMPHS PCT: 15 %
MCH: 27.2 pg (ref 26.0–34.0)
MCHC: 30.5 g/dL (ref 30.0–36.0)
MCV: 89.5 fL (ref 80.0–100.0)
MONOS PCT: 8 %
Monocytes Absolute: 0.9 10*3/uL (ref 0.1–1.0)
Neutro Abs: 8 10*3/uL — ABNORMAL HIGH (ref 1.7–7.7)
Neutrophils Relative %: 73 %
Platelets: 266 10*3/uL (ref 150–400)
RBC: 5.69 MIL/uL (ref 4.22–5.81)
RDW: 13.3 % (ref 11.5–15.5)
WBC: 10.9 10*3/uL — AB (ref 4.0–10.5)
nRBC: 0 % (ref 0.0–0.2)

## 2018-10-01 LAB — BASIC METABOLIC PANEL
Anion gap: 14 (ref 5–15)
BUN: 17 mg/dL (ref 8–23)
CALCIUM: 8.9 mg/dL (ref 8.9–10.3)
CO2: 22 mmol/L (ref 22–32)
CREATININE: 1.58 mg/dL — AB (ref 0.61–1.24)
Chloride: 102 mmol/L (ref 98–111)
GFR calc non Af Amer: 46 mL/min — ABNORMAL LOW (ref >60)
GFR, EST AFRICAN AMERICAN: 53 mL/min — AB (ref >60)
Glucose, Bld: 175 mg/dL — ABNORMAL HIGH (ref 70–99)
Potassium: 3.7 mmol/L (ref 3.5–5.1)
SODIUM: 138 mmol/L (ref 135–145)

## 2018-10-01 LAB — GLUCOSE, CAPILLARY
Glucose-Capillary: 181 mg/dL — ABNORMAL HIGH (ref 70–99)
Glucose-Capillary: 159 mg/dL — ABNORMAL HIGH (ref 70–99)

## 2018-10-01 LAB — I-STAT TROPONIN, ED: TROPONIN I, POC: 0.01 ng/mL (ref 0.00–0.08)

## 2018-10-01 MED ORDER — CANAGLIFLOZIN 100 MG PO TABS
100.0000 mg | ORAL_TABLET | Freq: Every day | ORAL | Status: DC
  Administered 2018-10-02: 100 mg via ORAL
  Filled 2018-10-01 (×2): qty 1

## 2018-10-01 MED ORDER — DIGOXIN 125 MCG PO TABS
0.1250 mg | ORAL_TABLET | Freq: Every day | ORAL | Status: DC
  Administered 2018-10-02: 0.125 mg via ORAL
  Filled 2018-10-01: qty 1

## 2018-10-01 MED ORDER — INSULIN ASPART 100 UNIT/ML ~~LOC~~ SOLN: 30.0000 [IU] | Freq: Every day | SUBCUTANEOUS | Status: DC

## 2018-10-01 MED ORDER — DILTIAZEM HCL-DEXTROSE 100-5 MG/100ML-% IV SOLN (PREMIX)
5.0000 mg/h | INTRAVENOUS | Status: DC
  Administered 2018-10-01: 5 mg/h via INTRAVENOUS
  Filled 2018-10-01: qty 100

## 2018-10-01 MED ORDER — ATORVASTATIN CALCIUM 40 MG PO TABS
40.0000 mg | ORAL_TABLET | Freq: Every day | ORAL | Status: DC
  Administered 2018-10-01: 40 mg via ORAL
  Filled 2018-10-01: qty 1

## 2018-10-01 MED ORDER — ACETAMINOPHEN 325 MG PO TABS: 650.0000 mg | ORAL_TABLET | ORAL | Status: DC | PRN

## 2018-10-01 MED ORDER — ALPRAZOLAM 0.5 MG PO TABS: 0.5000 mg | ORAL_TABLET | Freq: Every evening | ORAL | Status: DC | PRN

## 2018-10-01 MED ORDER — INSULIN ASPART 100 UNIT/ML ~~LOC~~ SOLN: 15.0000 [IU] | Freq: Three times a day (TID) | SUBCUTANEOUS | Status: DC

## 2018-10-01 MED ORDER — NITROGLYCERIN 0.4 MG SL SUBL: 0.4000 mg | SUBLINGUAL_TABLET | SUBLINGUAL | Status: DC | PRN

## 2018-10-01 MED ORDER — ZOLPIDEM TARTRATE 5 MG PO TABS
10.0000 mg | ORAL_TABLET | Freq: Every day | ORAL | Status: DC
  Administered 2018-10-01: 10 mg via ORAL
  Filled 2018-10-01: qty 2

## 2018-10-01 MED ORDER — ASPIRIN EC 81 MG PO TBEC
81.0000 mg | DELAYED_RELEASE_TABLET | Freq: Every day | ORAL | Status: DC
  Administered 2018-10-02: 81 mg via ORAL
  Filled 2018-10-01: qty 1

## 2018-10-01 MED ORDER — SODIUM CHLORIDE 0.9 % IV BOLUS
500.0000 mL | Freq: Once | INTRAVENOUS | Status: AC
  Administered 2018-10-01: 500 mL via INTRAVENOUS

## 2018-10-01 MED ORDER — APIXABAN 5 MG PO TABS
5.0000 mg | ORAL_TABLET | Freq: Two times a day (BID) | ORAL | Status: DC
  Administered 2018-10-01 – 2018-10-02 (×2): 5 mg via ORAL
  Filled 2018-10-01 (×2): qty 1

## 2018-10-01 MED ORDER — SODIUM CHLORIDE 0.9% FLUSH
3.0000 mL | INTRAVENOUS | Status: DC | PRN
  Administered 2018-10-02: 3 mL via INTRAVENOUS
  Filled 2018-10-01: qty 3

## 2018-10-01 MED ORDER — SODIUM CHLORIDE 0.9 % IV SOLN: 250.0000 mL | INTRAVENOUS | Status: DC | PRN

## 2018-10-01 MED ORDER — OMEGA-3-ACID ETHYL ESTERS 1 G PO CAPS
2.0000 g | ORAL_CAPSULE | Freq: Two times a day (BID) | ORAL | Status: DC
  Administered 2018-10-01 – 2018-10-02 (×2): 2 g via ORAL
  Filled 2018-10-01 (×2): qty 2

## 2018-10-01 MED ORDER — DILTIAZEM HCL 30 MG PO TABS
30.0000 mg | ORAL_TABLET | Freq: Once | ORAL | Status: AC
  Administered 2018-10-01: 30 mg via ORAL
  Filled 2018-10-01: qty 1

## 2018-10-01 MED ORDER — ONDANSETRON HCL 4 MG/2ML IJ SOLN: 4.0000 mg | Freq: Four times a day (QID) | INTRAMUSCULAR | Status: DC | PRN

## 2018-10-01 MED ORDER — INSULIN ASPART 100 UNIT/ML ~~LOC~~ SOLN: 15.0000 [IU] | Freq: Two times a day (BID) | SUBCUTANEOUS | Status: DC

## 2018-10-01 MED ORDER — LEVOTHYROXINE SODIUM 25 MCG PO TABS
125.0000 ug | ORAL_TABLET | Freq: Every day | ORAL | Status: DC
  Administered 2018-10-02: 125 ug via ORAL
  Filled 2018-10-01: qty 1

## 2018-10-01 MED ORDER — DILTIAZEM LOAD VIA INFUSION
10.0000 mg | Freq: Once | INTRAVENOUS | Status: AC
  Administered 2018-10-01: 10 mg via INTRAVENOUS
  Filled 2018-10-01: qty 10

## 2018-10-01 MED ORDER — INSULIN GLARGINE 100 UNIT/ML ~~LOC~~ SOLN
60.0000 [IU] | Freq: Every day | SUBCUTANEOUS | Status: DC
  Administered 2018-10-01: 60 [IU] via SUBCUTANEOUS
  Filled 2018-10-01 (×2): qty 0.6

## 2018-10-01 MED ORDER — SODIUM CHLORIDE 0.9% FLUSH
3.0000 mL | Freq: Two times a day (BID) | INTRAVENOUS | Status: DC
  Administered 2018-10-01: 3 mL via INTRAVENOUS

## 2018-10-01 MED ORDER — METOPROLOL TARTRATE 50 MG PO TABS
100.0000 mg | ORAL_TABLET | Freq: Two times a day (BID) | ORAL | Status: DC
  Administered 2018-10-01 – 2018-10-02 (×2): 100 mg via ORAL
  Filled 2018-10-01 (×2): qty 2

## 2018-10-01 MED ORDER — FUROSEMIDE 20 MG PO TABS
20.0000 mg | ORAL_TABLET | Freq: Every day | ORAL | Status: DC
  Administered 2018-10-02: 20 mg via ORAL
  Filled 2018-10-01: qty 1

## 2018-10-01 MED ORDER — DIGOXIN 0.25 MG/ML IJ SOLN
0.2500 mg | Freq: Four times a day (QID) | INTRAMUSCULAR | Status: AC
  Administered 2018-10-01 (×2): 0.25 mg via INTRAVENOUS
  Filled 2018-10-01 (×2): qty 2

## 2018-10-01 MED ORDER — DILTIAZEM HCL ER COATED BEADS 120 MG PO CP24
240.0000 mg | ORAL_CAPSULE | Freq: Every day | ORAL | Status: DC
  Administered 2018-10-02: 240 mg via ORAL
  Filled 2018-10-01: qty 2

## 2018-10-01 MED ORDER — INSULIN ASPART 100 UNIT/ML ~~LOC~~ SOLN
15.0000 [IU] | Freq: Every day | SUBCUTANEOUS | Status: DC
  Administered 2018-10-02: 15 [IU] via SUBCUTANEOUS

## 2018-10-01 MED ORDER — DILTIAZEM HCL 25 MG/5ML IV SOLN
10.0000 mg | Freq: Once | INTRAVENOUS | Status: AC
  Administered 2018-10-01: 10 mg via INTRAVENOUS

## 2018-10-01 NOTE — ED Notes (Signed)
Cards at bedside

## 2018-10-01 NOTE — H&P (Addendum)
Cardiology History & Physical    Patient ID: RAAD CLAYSON MRN: [id number], DOB: 04/04/1956 Date of Encounter: 10/01/2018, 4:14 PM Primary Physician: [name], MD Primary Cardiologist: Cristopher Peru, MD Primary Electrophysiologist:  None  Chief Complaint: elevated HR Reason for Admission: recurrent rapid atrial flutter Requesting MD: PA Joy  HPI: Jerry BEAUMIER is a 62 y.o. 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 He is here with rapid atrial flutter.  He has a complex cardiac history. From CAD standpoint, he had PCI as above with last cath in 12/2009 (done for abnormal nuclear study) resulting in stenting of the ISR of the Cx. This also showed diffuse CAD otherwise managed medically with 40% prox LAD, 40% mLAD, 30% Cx beyond distal stent, 30% PLA, old diffuse 80% stenosis in 2 branches of small nondominant RCA. [place] in 2016 showed normal pulmonary pressures. EF was previously normal but in 2019 his EF has been in the 40-50% range. 2D echo in 06/2018 showed mild LVH, EF 45-50%, grade 2 DD, mitral systolic bowing without prolapse, mildly dilated LA, mildly dilated RV with mildly reduced RV function.   He was remotely followed by Dr. Lovena Le for a long RP tachycardia previously stopped with IV adenosine. He had ventricular pre-excitation at that time although it was felt atypical. He was felt to have WPW and underwent RFA ablation 2016. He's not sure if he had interim afib between then and 2019. However he has been following his HR via the [place] APP and noticed irregularity with rapid rate in September 2019. He was found to have coarse  afib vs flutter by EKG 07/09/18. He presented to the ED 07/11/18 after his [facility] app showed elevated HR. The patient had noted fatigue and SOB. In the ED he was in NSR with PVCs. He had CHF on his CXR. It was suggested he increase his Lasix to 20 mg daily and his Toprol to 100 mg daily with an extra 50 mg Q pm for sustained HR > 130. He was readmitted 08/07/18 with atrial flutter with RVR when it was noted his HR was 130 while he was getting his pulmonary fibrosis experimental drug infusion as an OP. The patient believed he was apparently in RVR before the infusion as well. He was seen in consult by Dr Curt Bears. Given his pulmonary fibrosis, CKD and CAD he was not felt to be a candidate for antiarrythmic drug therapy and rate control was recommended. This has been limited by fatigue, sinus bradycardia when in normal rhythm, and tendency towards low blood pressure. His metoprolol was increased to 100 mg BID. He called Dr Dennard Schaumann on 10/22 to relate his HR was still elevated on his [facility] app and Dr Dennard Schaumann added Diltiazem 180 mg. When he was seen in our office 08/20/18 he was in NSR and felt to be doing well. Otherwise, his pulmonary disease is followed by Dr. Chase Caller. He failed nintedanib and pirfenidone because of side effects. He has been on an experimental infusion but otherwise supportive care measures. He has not been interested in pulmonary transplant per notes.   He continues to follow his HR via the [place] APP.  His last episode of tachycardia was about a week ago, but then over the weekend developed persistent tachycardia with HR in the 120s-130s with irregular rhythm by APP. He saw his PCP today who sent him to the ER for further management. He reports med compliance and no missed doses of anticoagulation. He is generally unaware of his tachycardia when it's happening and the only way he can tell is from the APP. He reports generalized fatigue and his legs feeling chronically cold but denies any acute  chest pain. He feels his chronic dyspnea is at baseline.   Past Medical History:  Diagnosis Date  . Acute diastolic heart failure ([place])   . Atrial fibrillation (HCC)    vs flutter  . Atrial flutter ([place])   . Atrial tachycardia ([place])   . CAD (coronary artery disease)    a. PCI of prox LAD and Cx in 2004. b. PCI to Cx 2008. c. PCI to ISR of Cx in 2011.  . Cardiomyopathy ([place])    a. EF 45-50% in 06/2018.  Marland Kitchen Chronic respiratory failure with hypoxia, on home O2 therapy ([place])   . CKD (chronic kidney disease), stage III ([place])   . COPD (chronic obstructive pulmonary disease) ([place])   . Emphysematous bleb ([place])    a.  s/p pleurectomy/pleurocentesis 2011  . Hyperlipidemia   . Hypertension   . Hypothyroidism   . Idiopathic pulmonary fibrosis ([place])   . On home oxygen therapy    "3L; 24/7" (04/15/2015)  . Pneumothorax on right 4/11  . Postinflammatory pulmonary fibrosis ([place])   . Sleep apnea    "won't use CPAP" (04/14/2105)  . SVT (supraventricular tachycardia) ([place])    Per Dr. Tanna Furry note  . Type II diabetes mellitus ([place])   . WPW (Wolff-Parkinson-White syndrome)    Per Dr. Tanna Furry Note     Surgical History:  Past Surgical History:  Procedure Laterality Date  . BILATERAL VATS ABLATION Right   . CARDIAC CATHETERIZATION  05/03/2007   patent stents  . CARDIAC CATHETERIZATION N/A 05/18/2015   Procedure: Right Heart Cath;  Surgeon: Belva Crome, MD;  Location: [place] [facility];  Service: Cardiovascular;  Laterality: N/A;  . CORONARY ANGIOPLASTY WITH STENT PLACEMENT  03/05/2003; 2008; 01/13/2010   PCI & Stent  LAD & mid CX; stent to CX  (I've got 4 stents total" (04/15/2015)  . ELECTROPHYSIOLOGIC STUDY N/A 04/14/2015   Procedure: SVT Ablation;  Surgeon: Evans Lance, MD;  Location: [place] [facility];  Service: Cardiovascular;  Laterality: N/A;  . KNEE ARTHROSCOPY Right 01/22/2013   Procedure: RIGHT ARTHROSCOPY KNEE WITH DEBRIDEMENT, abrasion chondroplasty of lateral tibial plateau,  debridement of partial tear of ACL, menisectomy;  Surgeon: Tobi Bastos, MD;  Location: [facility];  Service: Orthopedics;  Laterality: Right;  . KNEE SURGERY  2012   "reattached quad"  . LUNG LOBECTOMY Right   . SHOULDER ARTHROSCOPY W/ ROTATOR CUFF REPAIR Bilateral   . VIDEO ASSISTED THORACOSCOPY Left 07/19/2015   Procedure: LEFT VIDEO ASSISTED THORACOSCOPY WITH LEFT UPPER AND LOWER LUNG BIOPSY;  Surgeon: Melrose Nakayama, MD;  Location: [place];  Service: Thoracic;  Laterality: Left;  Marland Kitchen VIDEO BRONCHOSCOPY N/A 07/19/2015   Procedure: VIDEO BRONCHOSCOPY WITH FLUORO;  Surgeon: Melrose Nakayama, MD;  Location: [place];  Service: Thoracic;  Laterality: N/A;     Home Meds: Prior to Admission medications   Medication Sig Start Date End Date Taking? Authorizing Provider  ALPRAZolam Duanne Moron) 0.5 MG tablet Take 1 tablet (0.5  mg total) by mouth at bedtime as needed for anxiety. 07/27/17  Yes [name], MD  apixaban (ELIQUIS) 5 MG TABS tablet Take 5 mg by mouth 2 (two) times daily.   Yes [provider]  aspirin 81 MG tablet Take 81 mg by mouth every morning.    Yes [provider]  atorvastatin (LIPITOR) 40 MG tablet Take 1 tablet by mouth every night at bedtime 09/03/18  Yes [name], MD  dapagliflozin propanediol (FARXIGA) 10 MG TABS tablet Take 10 mg by mouth daily. 02/07/18  Yes [name], MD  diltiazem (CARDIZEM CD) 180 MG 24 hr capsule TAKE 1 CAPSULE BY MOUTH EVERY DAY Patient taking differently: Take 180 mg by mouth daily.  09/03/18  Yes [name], MD  furosemide (LASIX) 20 MG tablet Take 1 tablet (20 mg total) by mouth daily as needed for fluid (fluid). Patient taking differently: Take 20 mg by mouth daily.  07/08/18  Yes [name], MD  insulin aspart (NOVOLOG) 100 UNIT/ML injection 15 UNITS WITH BREAKFAST AND 15 UNITS WITH LUNCH, 20 UNITS WITH SUPPER Patient taking differently: Inject 15-30 Units into the skin See admin instructions.  Inject 15 units into the skin after breakfast, 15 units after lunch, and 30 units after supper 02/07/18  Yes [name], MD  blood glucose meter kit and supplies KIT Dispense based on patient and insurance preference. Use up to four times daily as directed. (FOR ICD E11.65) 11/24/15   [name], MD  Blood Glucose Monitoring Suppl (ONE TOUCH ULTRA 2) w/Device KIT Use to monitor FSBS 4x daily for fluctuating blood sugars. Dx: E11.65. Patient taking differently: 1 each See admin instructions. Use to monitor FSBS 4x daily for fluctuating blood sugars. Dx: E11.65 02/26/17   [name], MD  Blood Glucose Monitoring Suppl (ONE TOUCH ULTRA SYSTEM KIT) w/Device KIT Use to monitor FSBS 4x daily for fluctuating blood sugars. Dx: E11.65. 01/04/16   Alycia Rossetti, MD  LANTUS 100 UNIT/ML injection INJECT 0.6 MLS (60 UNITS TOTAL) INTO THE SKIN AT BEDTIME. Patient taking differently: Inject 60 Units into the skin at bedtime.  09/23/18   [name], MD  levothyroxine (SYNTHROID, LEVOTHROID) 125 MCG tablet Take 1 tablet (125 mcg total) by mouth daily. 07/08/18   [name], MD  Menthol, Topical Analgesic, (BIOFREEZE EX) Apply 1 application topically as needed (for pain).    [provider]  metFORMIN (GLUCOPHAGE) 850 MG tablet Take 1 tablet by mouth twice a day Patient taking differently: Take 850 mg by mouth 2 (two) times daily with a meal.  04/08/18   [name], MD  metoprolol tartrate (LOPRESSOR) 100 MG tablet Take 1 tablet (100 mg total) by mouth 2 (two) times daily. 08/09/18   Daune Perch, NP  nitroGLYCERIN (NITROSTAT) 0.4 MG SL tablet Place 1 tablet (0.4 mg total) under the tongue every 5 (five) minutes as needed for chest pain. 08/12/18   [name], MD  omega-3 acid ethyl esters (LOVAZA) 1 g capsule Take 2 capsules (2 g total) by mouth 2 (two) times daily. 03/07/18   Troy Sine, MD  Omega-3 Fatty Acids (FISH OIL PO) Take 2 capsules by mouth 2 (two)  times daily.    [provider]  ONE TOUCH ULTRA TEST test strip Use to monitor FSBS 4x daily for fluctuating blood sugars. Dx: E11.65. Patient taking differently: 1 each by Other route See admin instructions. Use to monitor FSBS 4x  daily for fluctuating blood sugars. Dx: E11.65 11/09/17   [name], MD  [facility] DELICA LANCETS FINE MISC Test up to 4 times a day as directed. Patient taking differently: 1 each See admin instructions. Test up to 4 times a day as directed 11/10/16   [name], MD  OXYGEN Inhale 6-8 L into the lungs continuous.    [provider]  SYRINGE/NEEDLE, DISP, 1 ML (B-D SYRINGE/NEEDLE 1CC/25GX5/8) 25G X 5/8" 1 ML MISC As directed 07/27/17   [name], MD  zolpidem (AMBIEN) 10 MG tablet Take 1 tablet by mouth at bedtime. 09/03/18   [name], MD    Allergies:  Allergies  Allergen Reactions  . Niaspan [Niacin] Itching  . Ofev [Nintedanib] Other (See Comments)    "Lethargic and wiped out"  . Pirfenidone Other (See Comments)    Caused the patient to unexpectedly wet the bed in the middle of the night (was never a problem prior to taking this med)  . Ramipril Cough         Social History   Socioeconomic History  . Marital status: Single    Spouse name: Not on file  . Number of children: 1  . Years of education: Not on file  . Highest education level: Not on file  Occupational History    Employer: [employer]  . Financial resource strain: Not on file  . Food insecurity:    Worry: Not on file    Inability: Not on file  . Transportation needs:    Medical: Not on file    Non-medical: Not on file  Tobacco Use  . Smoking status: Former Smoker    Packs/day: 1.50    Years: 35.00    Pack years: 52.50    Types: Cigarettes    Last attempt to quit: 10/24/2007    Years since quitting: 10.9  . Smokeless tobacco: Never Used  Substance and Sexual Activity  . Alcohol use: Yes    Alcohol/week: 0.0  standard drinks    Comment: 04/15/2015 "I've had a couple drinks in the last 8 months"  . Drug use: No  . Sexual activity: Not Currently  Lifestyle  . Physical activity:    Days per week: Not on file    Minutes per session: Not on file  . Stress: Not on file  Relationships  . Social connections:    Talks on phone: Not on file    Gets together: Not on file    Attends religious service: Not on file    Active member of club or organization: Not on file    Attends meetings of clubs or organizations: Not on file    Relationship status: Not on file  . Intimate partner violence:    Fear of current or ex partner: Not on file    Emotionally abused: Not on file    Physically abused: Not on file    Forced sexual activity: Not on file  Other Topics Concern  . Not on file  Social History Narrative  . Not on file     Family History  Problem Relation Age of Onset  . Breast cancer Mother   . Diabetes type II Mother   . Lung cancer Father     Review of Systems: All other systems reviewed and are otherwise negative except as noted above.  Labs:   Lab Results  Component Value Date   WBC 10.9 (H) 10/01/2018   HGB 15.5 10/01/2018  HCT 50.9 10/01/2018   MCV 89.5 10/01/2018   PLT 266 10/01/2018    Recent Labs  Lab 10/01/18 1232  NA 138  K 3.7  CL 102  CO2 22  BUN 17  CREATININE 1.58*  CALCIUM 8.9  GLUCOSE 175*   No results for input(s): CKTOTAL, CKMB, TROPONINI in the last 72 hours. Lab Results  Component Value Date   CHOL 107 07/05/2018   HDL 30 (L) 07/05/2018   LDLCALC 53 07/05/2018   TRIG 162 (H) 07/05/2018   Lab Results  Component Value Date   DDIMER  03/07/2010    0.26        AT THE INHOUSE ESTABLISHED CUTOFF VALUE OF 0.48 ug/mL FEU, THIS ASSAY HAS BEEN DOCUMENTED IN THE LITERATURE TO HAVE A SENSITIVITY AND NEGATIVE PREDICTIVE VALUE OF AT LEAST 98 TO 99%.  THE TEST RESULT SHOULD BE CORRELATED WITH AN ASSESSMENT OF THE CLINICAL PROBABILITY OF DVT / VTE.      Radiology/Studies:  Dg Chest 2 View  Result Date: 10/01/2018 CLINICAL DATA:  Chest pain tachycardia EXAM: CHEST - 2 VIEW COMPARISON:  07/11/2018 FINDINGS: Prominent lung markings are present bilaterally right greater than left mostly in the bases. These have been present on prior studies including chest CT of 05/17/2018 consistent with fibrosis. Negative for superimposed acute infiltrate or effusion.  No edema. IMPRESSION: Chronic lung disease with interstitial fibrosis similar to prior studies. No superimposed acute abnormality. Electronically Signed   By: Franchot Gallo M.D.   On: 10/01/2018 13:22   Wt Readings from Last 3 Encounters:  10/01/18 112 kg  09/17/18 109.8 kg  08/26/18 111.4 kg    EKG: atrial flutter with variable response, then atrial flutter with 2:1 conduction Both tracings otherwise show possible RVH and old inferior Qs.  Physical Exam: Blood pressure (!) 124/94, pulse (!) 134, temperature 98.1 F (36.7 C), temperature source Oral, resp. rate (!) 29, SpO2 95 %. There is no height or weight on file to calculate BMI. General: Well developed, well nourished WM in no acute distress on O2, lying flat in bed without acute dyspnea Head: Normocephalic, atraumatic, sclera non-icteric, no xanthomas, nares are without  discharge.  Neck: Negative for carotid bruits. JVD not elevated. Lungs: Coarse quiet crackles throughout. Breathing is unlabored on O2. Heart: Rapid rate, regular, with S1 S2. No murmurs, rubs, or gallops appreciated. Abdomen: Soft, non-tender, non-distended with normoactive bowel sounds. No hepatomegaly. No rebound/guarding. No obvious abdominal masses. Msk:  Strength and tone appear normal for age. Extremities: No clubbing or cyanosis. No edema.  Distal pedal pulses are 2+ and equal bilaterally. Neuro: Alert and oriented X 3. No focal deficit. No facial asymmetry. Moves all extremities spontaneously. Psych:  Responds to questions appropriately with a normal  affect.   CHADSVASC 4 for h/o CHF, HTN, CAD, DM.  Assessment and Plan   1. Recurrent rapid atrial flutter - also with questionable PAF by EKG 07/09/18. The majority of his EKGs thereafter have represented atrial flutter, suspect driven by underlying pulmonary disease. He has not been felt to be a good candidate for oral antiarrhythmic medications given his comorbidities (I.e. Amiodarone given his pulm fibrosis, Tikosyn because of baseline QTc, flecainide due to CAD). See below for plan per Dr. Stanford Breed. Will ask EP to see in AM.  2. CAD without associated chest pain - follow for symptoms. Continue ASA, BB, statin. No bleeding reported.  3. Cardiomyopathy as of 2019, ? Tachy-mediated - no acute CHF symptoms noted.  4. Pulmonary fibrosis with  chronic respiratory failure on home O2 - continue supplemental oxygen.  5. CKD stage III - creatinine at baseline.  6. DM - hold metformin as inpatient, continue [place] and continue home insulin. Schedule CBG checks.  Severity of Illness: The appropriate patient status for this patient is OBSERVATION. Observation status is judged to be reasonable and necessary in order to provide the required intensity of service to ensure the patient's safety. The patient's presenting symptoms, physical exam findings, and initial radiographic and laboratory data in the context of their medical condition is felt to place them at decreased risk for further clinical deterioration. Furthermore, it is anticipated that the patient will be medically stable for discharge from the hospital within 2 midnights of admission. The following factors support the patient status of observation.   " The patient's presenting symptoms include fatigue. " The physical exam findings include tachycardia, rales. " The initial radiographic and laboratory data are notable for rapid atrial flutter, chronically elevated creatinine     For questions or updates, please contact [facility] Please  consult [email or web address] for contact info under Cardiology/STEMI.  Signed, Charlie Pitter, PA-C 10/01/2018, 4:14 PM As above, patient seen and examined.  Briefly he is a 62 year old male with past medical history of atypical atrial flutter, probable coarse atrial fibrillation, coronary artery disease with prior PCI, idiopathic pulmonary fibrosis on home oxygen, prior WPW ablation, diabetes mellitus, hypertension, mildly reduced LV function with recurrent atrial flutter.  Patient has had recurring atrial arrhythmias in the past 2 to 3 months.  He has been treated with rate control and anticoagulation but heart rate continues to be elevated at times.  He was recently seen in consultation by Dr. Curt Bears.  Rate control was recommended.  He continues to have episodes at home of heart rates in the 130-140 range.  Most recently this started several days ago.  He states he has some fatigue during these episodes but no dyspnea, chest pain or palpitations.  His symptoms persisted and he was sent to the emergency room today. Exam shows blood pressure initially of 96/70 and pulse 135. Creatinine 1.58.  Electrocardiogram shows atrial flutter (atypical), right axis deviation and prior inferior infarct cannot be excluded.  1 atrial flutter with history of coarse atrial fibrillation-patient continues to have bouts of atrial flutter with heart rates in the 1 30-1 40 range.  He does have some fatigue during these events but otherwise is asymptomatic.  He is on both metoprolol and Cardizem and blood pressure is borderline.  Difficult situation.  We will continue with metoprolol at present dose.  Increase Cardizem to 240 mg daily.  Blood pressure will likely not allow further advances.  I will add digoxin as well.  Note he is not a good candidate for any antiarrhythmic.  Amiodarone would not be a good choice given baseline lung disease.  He has coronary disease and therefore flecainide not an option.  He has baseline prolonged QT  interval and therefore tikosyn not an option.  If we cannot control heart rate with medications then may need to consider attempt at atrial fibrillation/flutter ablation or AV node ablation and pacemaker placement.  I will have electrophysiology see patient in the morning.  Continue apixaban. CHADSvasc 3.  Of note patient's LV function has been mildly reduced.  This may be related to tachycardia with his atrial arrhythmias.  2 coronary artery disease-no chest pain.  Continue aspirin and statin.  3 pulmonary fibrosis-followed by pulmonary.  On home oxygen.  Kirk Ruths, MD

## 2018-10-01 NOTE — Progress Notes (Signed)
Pt states if CBG is less than 300, he only takes 15 units of novolog. If CBG greater than 300, he takes full 30 units of novolog at home. Dr. Deforest HoylesAkhter paged and made aware. Provider called back and gave VORB to change instructions to above for patient.

## 2018-10-01 NOTE — Progress Notes (Signed)
Subjective:    Patient ID: Jerry Montgomery, male    DOB: August 29, 1956, 62 y.o.   MRN: [id number]  HPI   07/09/18 Patient is a very pleasant 62 year old Caucasian male with a significant past cardiovascular history.  I have copied the history from his recent cardiology appointment for a brief synopsis:  Mr. Ruz has history of known coronary artery disease in May 2004 underwent stenting of his proximal LAD and proximal circumflex coronary arteries. In July 2008 he developed a new 85% stenosis of the mid circumflex vessel at which time a 3.5x13 mm Cypher stent was inserted. His 2 previously placed stents remain patent. At his last catheterization in March 2011 he was found to have 95% stenosis between the tube previously placed stents in the circumflex and a new 3.0x20 mm post and was inserted to cover the mid area and also cover the proximal region. His LAD stent was patent with 40% proximal and mid stenosis obtuse marginal 1 vessel at 60-70% narrowing the distal circumflex had 30% narrowing and a nondominant right coronary artery had 80% stenosis.   He also has been treated for Wolff-Parkinson-White syndrome by Dr. Lovena Le with ablation for SVT.    Recently has been entered in an investigational study under the care of his pulmonologist for idiopathic pulmonary fibrosis.  Since he has been receiving the investigational drug, he has noticed palpitations and tachyarrhythmias.  His smart phone has an app that has alerted him for possible atrial fibrillation.  Symptoms have been particularly bad over the last 2 weeks.  Patient reports feeling his heart race and skip.  He presents today for further evaluation.  On presentation the patient has an irregularly irregular rhythm.  EKG shows an irregularly irregular rhythm consistent with atrial fibrillation.  There is no evidence of ischemia however there are Q waves in the inferior leads suggesting previous inferior infarction.  Patient's heart rate throughout  today's encounter is between 110 and 120 bpm.  There is no evidence of fluid overload or congestive heart failure on exam.  He is sitting comfortably talking without increased respiratory effort.  He is currently taking Toprol-XL 100 mg a day.  At that time, my plan was: Patient is on aspirin and Plavix.  I believe he needs Eliquis now due to the atrial fibrillation.  Therefore I will have him discontinue Plavix but continue aspirin.  Begin Eliquis 5 mg p.o. twice daily.  Consult cardiology.  Check TSH to ensure adequate dosage of levothyroxine.  Focus on rate control by increasing Toprol-XL from 100 mg a day to 200 mg a day and recheck heart rate later this week on Friday.  Most lab work is listed below and aside from poorly controlled diabetes showed no serious abnormalities.  We will discuss the management of his sugars on Friday.  08/12/18 Unfortunately, he was recently admitted due to a fib with RVR.  I have copied relevant portions of the discharge summary below for my reference: Admit date: 08/07/2018 Discharge date: 08/09/2018  Discharge Diagnoses    Jerry Montgomery is a 62 y.o.malewith history of pulmonary fibrosison home oxygen,CAD, CKD,type II DM, hypertension, hyperlipidemia,OSA not using CPAPand PAF. He developed atrial flutter with rapid ventricular response after receiving an experimental infusion for his pulmonary fibrosis.  1. Atrial fibrillation/flutter: The patient is continued on his home Toprol-XL 100 mg daily. We gave an extra metoprolol tartrate 50 mg yesterday for elevated heart rate when he was upset.He is still in atrial flutter with rates in  the 120s-130s.He is asymptomatic.He has a prolonged QT and is not a candidate for Tikosyn. His pulmonary fibrosis is a contraindication for amiodarone. Per electrophysiology consult, we are continuing to aim for rate control. He is anticoagulated with apixaban 5 mg twice daily forCHA2DS2-VASc Score of 3 (HTN, CAD, DM) He  has had better rate control in the past on Toprol-XL 200 mg daily but this was reduced when his heart rate was in the 50s and he said he was fatigued. We have increased his beta-blocker to metoprolol tartrate 100 mg twice daily.We have given cardizem 60 mg with no change in rate. The patient is very agitated and insistent on going home. We feel that his agitation is contributing to his elevated heart rate and will go ahead and discharge him with close follow up in the office. He has the cardia app on his phone and watches it closely.  Can decide if need to add diltiazem at follow up if rate still elevated.   08/12/18 Patient is here today for follow-up.  Since discharge from the hospital, the patient's heart rate has been in the 130s to 140s.  He has this documented on his phone app.  I calculate his heart rate today to be in the 120s to 130s.  Blood pressure is also borderline at 100/74.  I verify this myself.  Patient denies any chest pain.  He denies any shortness of breath beyond his baseline.  He denies any syncope or presyncope.  Despite his significant tachycardia, he is sitting comfortably in her room having a conversation in no apparent distress.  He is taking metoprolol 100 mg twice daily and he has not missed any doses.  He is also on isosorbide however he denies any angina.  At that time, my plan was: Patient's heart rate is out of control.  However his blood pressure is so low I am afraid to add more medication due to the risk of hypotension.  Therefore I recommended discontinuation of isosorbide and replacement with Cardizem CD 180 mg p.o. daily.  Recheck heart rate on Thursday to see if there has been improvement.  If not will gradually increase up to a maximum of 360 mg a day as long as the patient's blood pressure can tolerate it.  Patient has an appointment to meet with his cardiologist next week for further titration.  Follow-up on Thursday as planned.  25 minutes was spent today with the  patient reviewing his medical records from the hospital, discussing his options.  10/01/18 Below I have copied a triage note from yesterday:   Patient's wife called in today requesting and appointment with Dr. Dennard Schaumann. She stated that patient's blood pressure was 95/70 and his pulse was 120. She informed me that patient had already taken his metoprolol for today. I advised patient's wife to have patient water and elevate legs and if blood pressure remained low and patient pulse did not return to normal to go to the urgent care or ER. Patient's wife verbalized understanding. Patient was given an appointment for tomorrow but advised to go to ER or call 911 if symptoms persist.       Patient presents today in atrial fibrillation with rapid ventricular response.  Over the last week her heart rate has been 130s to 150s.  He reports increasing shortness of breath.  He reports easy fatigability.  He can barely walk at home without becoming extremely short of breath.  His blood pressure has been extremely low.  It has been  ranging between 88 and 95/60-70.  I checked it today and found it to be 90/70.  He denies any syncope or near syncope however he does report increasing shortness of breath.  He reports persistent tachycardia.  I have recommended EMS transport to the hospital.  Patient refuses EMS transport.  He begrudgingly will allow his daughter to drive him to the hospital.  I do not believe the patient should drive himself to the hospital.  Patient acquiesces and will allow me to call his daughter to carry him to the hospital  Past Medical History:  Diagnosis Date  . Acute diastolic heart failure ([place])   . Atrial fibrillation ([place])   . Atrial tachycardia ([place])   . CAD (coronary artery disease)   . COPD (chronic obstructive pulmonary disease) ([place])   . Hyperlipidemia   . Hypertension   . Hypothyroidism   . On home oxygen therapy    "3L; 24/7" (04/15/2015)  . Pneumothorax on right 4/11  .  Postinflammatory pulmonary fibrosis ([place])   . Sleep apnea    "won't use CPAP" (04/14/2105)  . SVT (supraventricular tachycardia) ([place])    Per Dr. Tanna Furry note  . Type II diabetes mellitus ([place])   . WPW (Wolff-Parkinson-White syndrome)    Per Dr. Tanna Furry Note   Past Surgical History:  Procedure Laterality Date  . BILATERAL VATS ABLATION Right   . CARDIAC CATHETERIZATION  05/03/2007   patent stents  . CARDIAC CATHETERIZATION N/A 05/18/2015   Procedure: Right Heart Cath;  Surgeon: Belva Crome, MD;  Location: [facility];  Service: Cardiovascular;  Laterality: N/A;  . CORONARY ANGIOPLASTY WITH STENT PLACEMENT  03/05/2003; 2008; 01/13/2010   PCI & Stent  LAD & mid CX; stent to CX  (I've got 4 stents total" (04/15/2015)  . ELECTROPHYSIOLOGIC STUDY N/A 04/14/2015   Procedure: SVT Ablation;  Surgeon: Evans Lance, MD;  Location: [facility];  Service: Cardiovascular;  Laterality: N/A;  . KNEE ARTHROSCOPY Right 01/22/2013   Procedure: RIGHT ARTHROSCOPY KNEE WITH DEBRIDEMENT, abrasion chondroplasty of lateral tibial plateau, debridement of partial tear of ACL, menisectomy;  Surgeon: Tobi Bastos, MD;  Location: [facility];  Service: Orthopedics;  Laterality: Right;  . KNEE SURGERY  2012   "reattached quad"  . LUNG LOBECTOMY Right   . SHOULDER ARTHROSCOPY W/ ROTATOR CUFF REPAIR Bilateral   . VIDEO ASSISTED THORACOSCOPY Left 07/19/2015   Procedure: LEFT VIDEO ASSISTED THORACOSCOPY WITH LEFT UPPER AND LOWER LUNG BIOPSY;  Surgeon: Melrose Nakayama, MD;  Location: [facility];  Service: Thoracic;  Laterality: Left;  Marland Kitchen VIDEO BRONCHOSCOPY N/A 07/19/2015   Procedure: VIDEO BRONCHOSCOPY WITH FLUORO;  Surgeon: Melrose Nakayama, MD;  Location: [facility];  Service: Thoracic;  Laterality: N/A;   Current Outpatient Medications on File Prior to Visit  Medication Sig Dispense Refill  . ALPRAZolam (XANAX) 0.5 MG tablet Take 1 tablet (0.5 mg total) by mouth at bedtime as needed for anxiety. 30 tablet 0    . apixaban (ELIQUIS) 5 MG TABS tablet Take 5 mg by mouth 2 (two) times daily.    Marland Kitchen aspirin 81 MG tablet Take 81 mg by mouth every morning.     Marland Kitchen atorvastatin (LIPITOR) 40 MG tablet Take 1 tablet by mouth every night at bedtime 90 tablet 1  . blood glucose meter kit and supplies KIT Dispense based on patient and insurance preference. Use up to four times daily as directed. (FOR ICD E11.65) 1 each 0  . Blood Glucose Monitoring  Suppl (ONE TOUCH ULTRA 2) w/Device KIT Use to monitor FSBS 4x daily for fluctuating blood sugars. Dx: E11.65. 1 each 0  . Blood Glucose Monitoring Suppl (ONE TOUCH ULTRA SYSTEM KIT) w/Device KIT Use to monitor FSBS 4x daily for fluctuating blood sugars. Dx: E11.65. 1 each 1  . dapagliflozin propanediol (FARXIGA) 10 MG TABS tablet Take 10 mg by mouth daily. 30 tablet 0  . diltiazem (CARDIZEM CD) 180 MG 24 hr capsule TAKE 1 CAPSULE BY MOUTH EVERY DAY 90 capsule 3  . furosemide (LASIX) 20 MG tablet Take 1 tablet (20 mg total) by mouth daily as needed for fluid (fluid). (Patient taking differently: Take 20 mg by mouth daily. ) 90 tablet 2  . insulin aspart (NOVOLOG) 100 UNIT/ML injection 15 UNITS WITH BREAKFAST AND 15 UNITS WITH LUNCH, 20 UNITS WITH SUPPER (Patient taking differently: Inject 15-30 Units into the skin See admin instructions. 15 UNITS WITH BREAKFAST AND 15 UNITS WITH LUNCH, 30 UNITS WITH SUPPER) 60 mL 5  . LANTUS 100 UNIT/ML injection INJECT 0.6 MLS (60 UNITS TOTAL) INTO THE SKIN AT BEDTIME. 60 mL 3  . levothyroxine (SYNTHROID, LEVOTHROID) 125 MCG tablet Take 1 tablet (125 mcg total) by mouth daily. 90 tablet 2  . Menthol, Topical Analgesic, (BIOFREEZE EX) Apply 1 application topically as needed (for pain).    . metFORMIN (GLUCOPHAGE) 850 MG tablet Take 1 tablet by mouth twice a day (Patient taking differently: Take 850 mg by mouth 2 (two) times daily with a meal. Take 1 tablet by mouth  twice a day) 180 tablet 2  . metoprolol tartrate (LOPRESSOR) 100 MG tablet Take 1  tablet (100 mg total) by mouth 2 (two) times daily. 180 tablet 3  . nitroGLYCERIN (NITROSTAT) 0.4 MG SL tablet Place 1 tablet (0.4 mg total) under the tongue every 5 (five) minutes as needed for chest pain. 25 tablet 1  . omega-3 acid ethyl esters (LOVAZA) 1 g capsule Take 2 capsules (2 g total) by mouth 2 (two) times daily. 360 capsule 3  . Omega-3 Fatty Acids (FISH OIL PO) Take 2 capsules by mouth 2 (two) times daily.    . ONE TOUCH ULTRA TEST test strip Use to monitor FSBS 4x daily for fluctuating blood sugars. Dx: E11.65. 400 each 3  . ONETOUCH DELICA LANCETS FINE MISC Test up to 4 times a day as directed. 100 each 5  . OXYGEN Inhale 6-8 L into the lungs continuous.    . SYRINGE/NEEDLE, DISP, 1 ML (B-D SYRINGE/NEEDLE 1CC/25GX5/8) 25G X 5/8" 1 ML MISC As directed 100 each 5  . zolpidem (AMBIEN) 10 MG tablet Take 1 tablet by mouth at bedtime. 90 tablet 0   No current facility-administered medications on file prior to visit.    Allergies  Allergen Reactions  . Niaspan [Niacin] Itching  . Ofev [Nintedanib] Other (See Comments)    Lethargic, wiped out  . Pirfenidone     Other reaction(s): Other (See Comments)  . Ramipril Other (See Comments)    Cough  Other reaction(s): Other (See Comments) Cough   Social History   Socioeconomic History  . Marital status: Single    Spouse name: Not on file  . Number of children: 1  . Years of education: Not on file  . Highest education level: Not on file  Occupational History    Employer: [employer]  . Financial resource strain: Not on file  . Food insecurity:    Worry: Not on file  Inability: Not on file  . Transportation needs:    Medical: Not on file    Non-medical: Not on file  Tobacco Use  . Smoking status: Former Smoker    Packs/day: 1.50    Years: 35.00    Pack years: 52.50    Types: Cigarettes    Last attempt to quit: 10/24/2007    Years since quitting: 10.9  . Smokeless tobacco: Never Used  Substance and  Sexual Activity  . Alcohol use: Yes    Alcohol/week: 0.0 standard drinks    Comment: 04/15/2015 "I've had a couple drinks in the last 8 months"  . Drug use: No  . Sexual activity: Not Currently  Lifestyle  . Physical activity:    Days per week: Not on file    Minutes per session: Not on file  . Stress: Not on file  Relationships  . Social connections:    Talks on phone: Not on file    Gets together: Not on file    Attends religious service: Not on file    Active member of club or organization: Not on file    Attends meetings of clubs or organizations: Not on file    Relationship status: Not on file  . Intimate partner violence:    Fear of current or ex partner: Not on file    Emotionally abused: Not on file    Physically abused: Not on file    Forced sexual activity: Not on file  Other Topics Concern  . Not on file  Social History Narrative  . Not on file       Review of Systems  All other systems reviewed and are negative.      Objective:   Physical Exam  Constitutional: He appears well-developed and well-nourished. No distress.  Neck: No JVD present. No thyromegaly present.  Cardiovascular: S1 normal, S2 normal, normal heart sounds and normal pulses. An irregularly irregular rhythm present. Tachycardia present. Exam reveals no gallop and no friction rub.  No murmur heard. Pulmonary/Chest: No stridor. No respiratory distress. He has no wheezes. He has no rales. He exhibits no tenderness.  Abdominal: Soft. Bowel sounds are normal.  Musculoskeletal: He exhibits no edema.  Skin: He is not diaphoretic.  Vitals reviewed.         Assessment & Plan:  Atrial fibrillation, unspecified type (HCC)  Hypotension due to drugs  In no uncertain terms I told the patient he needs to go the hospital.  His heart rate is uncontrolled and he is has increasing shortness of breath.  I do not believe that he can tolerate this much longer given his other medical problems.  I would  recommend adding digoxin in an effort to try to control his heart rate better given the fact he cannot tolerate amiodarone with his underlying pulmonary fibrosis.  We may also need to decrease his Cardizem to allow his blood pressure to improve if the digoxin can manage his heart rate.  I believe these changes need to be made under the supervision of telemetry and a cardiologist while the patient is admitted.  Is not safe to make these changes at home.  Patient refuses EMS transport.  Initially he refused hospitalization but after a lot of discussion, he begrudgingly will allow me to call his daughter to carry him to the emergency room.

## 2018-10-01 NOTE — ED Notes (Signed)
Patient transported to X-ray 

## 2018-10-01 NOTE — ED Provider Notes (Addendum)
[facility] EMERGENCY DEPARTMENT Provider Note   CSN: [id number] Arrival date & time: 10/01/18  1200     History   Chief Complaint Chief Complaint  Patient presents with  . Tachycardia  . Chest Pain    HPI Jerry Montgomery is a 62 y.o. male.  HPI   Jerry Montgomery is a 62 y.o. male, with a history of diastolic heart failure, A. fib, COPD, HTN, SVT, DM, WPW, presenting to the ED with chest pain.  States he has had instances of chest pain intermittently for the last several weeks.  Most recent cluster of episodes began 4 days ago.  Central chest, described as a tightness, moderate, nonradiating.  Accompanied by increased shortness of breath.  Several weeks ago, patient was started on infusions every 3 weeks to treat his pulmonary fibrosis.  Since that time, he has had instances of chest discomfort and warnings on his iPhone of rapid A. fib.  His last infusion was the week of Thanksgiving.  He is on 6 to 8 L supplemental O2 at baseline.  He has not had to increase this.  Denies fever/chills, recent illness, N/V/D, abdominal pain, diaphoresis, dizziness, or any other complaints.   Past Medical History:  Diagnosis Date  . Acute diastolic heart failure ([place])   . Atrial fibrillation (HCC)    vs flutter  . Atrial flutter ([place])   . Atrial tachycardia ([place])   . CAD (coronary artery disease)    a. PCI of prox LAD and Cx in 2004. b. PCI to Cx 2008. c. PCI to ISR of Cx in 2011.  . Cardiomyopathy ([place])    a. EF 45-50% in 06/2018.  Marland Kitchen Chronic respiratory failure with hypoxia, on home O2 therapy ([place])   . CKD (chronic kidney disease), stage III ([place])   . COPD (chronic obstructive pulmonary disease) ([place])   . Emphysematous bleb ([place])    a.  s/p pleurectomy/pleurocentesis 2011  . Hyperlipidemia   . Hypertension   . Hypothyroidism   . Idiopathic pulmonary fibrosis ([place])   . On home oxygen therapy    "3L; 24/7" (04/15/2015)  . Pneumothorax on right 4/11  .  Postinflammatory pulmonary fibrosis ([place])   . Sleep apnea    "won't use CPAP" (04/14/2105)  . SVT (supraventricular tachycardia) ([place])    Per Dr. Tanna Furry note  . Type II diabetes mellitus ([place])   . WPW (Wolff-Parkinson-White syndrome)    Per Dr. Tanna Furry Note    Patient Active Problem List   Diagnosis Date Noted  . CAD S/P percutaneous coronary angioplasty 07/24/2018  . Chronic anticoagulation 07/24/2018  . Hypotension 07/19/2017  . Bilateral sensorineural hearing loss 04/26/2017  . Right ear impacted cerumen 04/26/2017  . Research subject 06/27/2016  . Obesity 06/27/2016  . Other fatigue 09/14/2015  . Pulmonary fibrosis ([place])   . 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 Chronic respiratory failure with hypoxia ([place]) 03/09/2015  . Chronic renal disease, stage III ([place]) 03/09/2015  . Other emphysema ([place]) 03/09/2015  . Insulin dependent diabetes mellitus ([place]) 02/05/2015  . Atrial fibrillation with RVR ([place])   . Acute diastolic heart failure ([place])   . Hypokalemia   . IPF (idiopathic pulmonary fibrosis) ([place])   . History of PSVT (paroxysmal supraventricular tachycardia) 02/02/2015  . Bradycardia 05/05/2014  . Hypoxemia 08/06/2013  . Need for immunization against influenza 08/06/2013  . ILD (interstitial lung disease) ([place]) 07/08/2013  . Dyspnea 07/08/2013  . OSA (obstructive sleep apnea) 07/08/2013  . Pulmonary emphysema ([place]) 07/08/2013  .  Chest wall pain 06/17/2013  . Chondral lesion 01/22/2013  . Cough 03/22/2012  . Pneumonia, organism unspecified(486) 03/07/2012  . DIABETES MELLITUS, TYPE II 02/23/2010  . COPD II 02/23/2010  . HYPERLIPIDEMIA 02/22/2010  . Essential hypertension 02/22/2010  . BRONCHIECTASIS 02/22/2010  . PULMONARY FIBROSIS 02/22/2010    Past Surgical History:  Procedure Laterality Date  . BILATERAL VATS ABLATION Right   . CARDIAC CATHETERIZATION  05/03/2007   patent stents  . CARDIAC CATHETERIZATION N/A 05/18/2015   Procedure: Right Heart Cath;  Surgeon:  Belva Crome, MD;  Location: [facility];  Service: Cardiovascular;  Laterality: N/A;  . CORONARY ANGIOPLASTY WITH STENT PLACEMENT  03/05/2003; 2008; 01/13/2010   PCI & Stent  LAD & mid CX; stent to CX  (I've got 4 stents total" (04/15/2015)  . ELECTROPHYSIOLOGIC STUDY N/A 04/14/2015   Procedure: SVT Ablation;  Surgeon: Evans Lance, MD;  Location: [facility];  Service: Cardiovascular;  Laterality: N/A;  . KNEE ARTHROSCOPY Right 01/22/2013   Procedure: RIGHT ARTHROSCOPY KNEE WITH DEBRIDEMENT, abrasion chondroplasty of lateral tibial plateau, debridement of partial tear of ACL, menisectomy;  Surgeon: Tobi Bastos, MD;  Location: [facility];  Service: Orthopedics;  Laterality: Right;  . KNEE SURGERY  2012   "reattached quad"  . LUNG LOBECTOMY Right   . SHOULDER ARTHROSCOPY W/ ROTATOR CUFF REPAIR Bilateral   . VIDEO ASSISTED THORACOSCOPY Left 07/19/2015   Procedure: LEFT VIDEO ASSISTED THORACOSCOPY WITH LEFT UPPER AND LOWER LUNG BIOPSY;  Surgeon: Melrose Nakayama, MD;  Location: [facility];  Service: Thoracic;  Laterality: Left;  Marland Kitchen VIDEO BRONCHOSCOPY N/A 07/19/2015   Procedure: VIDEO BRONCHOSCOPY WITH FLUORO;  Surgeon: Melrose Nakayama, MD;  Location: [facility];  Service: Thoracic;  Laterality: N/A;        Home Medications    Prior to Admission medications   Medication Sig Start Date End Date Taking? Authorizing Provider  ALPRAZolam Duanne Moron) 0.5 MG tablet Take 1 tablet (0.5 mg total) by mouth at bedtime as needed for anxiety. 07/27/17  Yes [name], MD  apixaban (ELIQUIS) 5 MG TABS tablet Take 5 mg by mouth 2 (two) times daily.   Yes [provider]  aspirin 81 MG tablet Take 81 mg by mouth every morning.    Yes [provider]  atorvastatin (LIPITOR) 40 MG tablet Take 1 tablet by mouth every night at bedtime 09/03/18  Yes [name], MD  dapagliflozin propanediol (FARXIGA) 10 MG TABS tablet Take 10 mg by mouth daily. 02/07/18  Yes [name],  MD  diltiazem (CARDIZEM CD) 180 MG 24 hr capsule TAKE 1 CAPSULE BY MOUTH EVERY DAY Patient taking differently: Take 180 mg by mouth daily.  09/03/18  Yes [name], MD  furosemide (LASIX) 20 MG tablet Take 1 tablet (20 mg total) by mouth daily as needed for fluid (fluid). Patient taking differently: Take 20 mg by mouth daily.  07/08/18  Yes [name], MD  insulin aspart (NOVOLOG) 100 UNIT/ML injection 15 UNITS WITH BREAKFAST AND 15 UNITS WITH LUNCH, 20 UNITS WITH SUPPER Patient taking differently: Inject 15-30 Units into the skin See admin instructions. Inject 15 units into the skin after breakfast, 15 units after lunch, and 30 units after supper 02/07/18  Yes Pickard, Cammie Mcgee, MD  LANTUS 100 UNIT/ML injection INJECT 0.6 MLS (60 UNITS TOTAL) INTO THE SKIN AT BEDTIME. Patient taking differently: Inject 60 Units into the skin at bedtime.  09/23/18  Yes Jenna Luo  T, MD  levothyroxine (SYNTHROID, LEVOTHROID) 125 MCG tablet Take 1 tablet (125 mcg total) by mouth daily. 07/08/18  Yes [name], MD  Menthol, Topical Analgesic, (BIOFREEZE EX) Apply 1 application topically as needed (for pain).   Yes [provider]  metFORMIN (GLUCOPHAGE) 850 MG tablet Take 1 tablet by mouth twice a day Patient taking differently: Take 850 mg by mouth 2 (two) times daily with a meal.  04/08/18  Yes [name], MD  metoprolol tartrate (LOPRESSOR) 100 MG tablet Take 1 tablet (100 mg total) by mouth 2 (two) times daily. 08/09/18  Yes Daune Perch, NP  blood glucose meter kit and supplies KIT Dispense based on patient and insurance preference. Use up to four times daily as directed. (FOR ICD E11.65) 11/24/15   [name], MD  Blood Glucose Monitoring Suppl (ONE TOUCH ULTRA 2) w/Device KIT Use to monitor FSBS 4x daily for fluctuating blood sugars. Dx: E11.65. Patient taking differently: 1 each See admin instructions. Use to monitor FSBS 4x daily for fluctuating blood sugars. Dx:  E11.65 02/26/17   [name], MD  Blood Glucose Monitoring Suppl (ONE TOUCH ULTRA SYSTEM KIT) w/Device KIT Use to monitor FSBS 4x daily for fluctuating blood sugars. Dx: E11.65. 01/04/16   Alycia Rossetti, MD  nitroGLYCERIN (NITROSTAT) 0.4 MG SL tablet Place 1 tablet (0.4 mg total) under the tongue every 5 (five) minutes as needed for chest pain. 08/12/18   [name], MD  omega-3 acid ethyl esters (LOVAZA) 1 g capsule Take 2 capsules (2 g total) by mouth 2 (two) times daily. 03/07/18   Troy Sine, MD  Omega-3 Fatty Acids (FISH OIL PO) Take 2 capsules by mouth 2 (two) times daily.    [provider]  ONE TOUCH ULTRA TEST test strip Use to monitor FSBS 4x daily for fluctuating blood sugars. Dx: E11.65. Patient taking differently: 1 each by Other route See admin instructions. Use to monitor FSBS 4x daily for fluctuating blood sugars. Dx: E11.65 11/09/17   [name], MD  [facility] DELICA LANCETS FINE MISC Test up to 4 times a day as directed. Patient taking differently: 1 each See admin instructions. Test up to 4 times a day as directed 11/10/16   [name], MD  OXYGEN Inhale 6-8 L into the lungs continuous.    [provider]  SYRINGE/NEEDLE, DISP, 1 ML (B-D SYRINGE/NEEDLE 1CC/25GX5/8) 25G X 5/8" 1 ML MISC As directed 07/27/17   [name], MD  zolpidem (AMBIEN) 10 MG tablet Take 1 tablet by mouth at bedtime. 09/03/18   [name], MD    Family History Family History  Problem Relation Age of Onset  . Breast cancer Mother   . Diabetes type II Mother   . Lung cancer Father     Social History Social History   Tobacco Use  . Smoking status: Former Smoker    Packs/day: 1.50    Years: 35.00    Pack years: 52.50    Types: Cigarettes    Last attempt to quit: 10/24/2007    Years since quitting: 10.9  . Smokeless tobacco: Never Used  Substance Use Topics  . Alcohol use: Yes    Alcohol/week: 0.0 standard drinks    Comment: 04/15/2015  "I've had a couple drinks in the last 8 months"  . Drug use: No     Allergies   Niaspan [niacin]; Ofev [nintedanib]; Pirfenidone; and Ramipril   Review of Systems Review of Systems  Constitutional: Negative for chills, diaphoresis and fever.  Respiratory: Positive for shortness of breath. Negative for cough.   Cardiovascular: Positive for chest pain. Negative for leg swelling.  Gastrointestinal: Negative for abdominal pain, diarrhea, nausea and vomiting.  Neurological: Negative for weakness and numbness.  All other systems reviewed and are negative.    Physical Exam Updated Vital Signs BP (!) 125/93 (BP Location: Right Arm)   Pulse (!) 127   Temp 98.1 F (36.7 C) (Oral)   Resp (!) 21   SpO2 98%   Physical Exam  Constitutional: He appears well-developed and well-nourished. No distress.  HENT:  Head: Normocephalic and atraumatic.  Eyes: Conjunctivae are normal.  Neck: Neck supple.  Cardiovascular: Normal heart sounds and intact distal pulses. An irregularly irregular rhythm present. Tachycardia present.  Pulses:      Radial pulses are 2+ on the right side, and 2+ on the left side.  Pulmonary/Chest: Effort normal and breath sounds normal. No respiratory distress.  Abdominal: Soft. There is no tenderness. There is no guarding.  Musculoskeletal: He exhibits no edema.  Lymphadenopathy:    He has no cervical adenopathy.  Neurological: He is alert.  Skin: Skin is warm and dry. He is not diaphoretic.  Psychiatric: He has a normal mood and affect. His behavior is normal.  Nursing note and vitals reviewed.    ED Treatments / Results  Labs (all labs ordered are listed, but only abnormal results are displayed) Labs Reviewed  BASIC METABOLIC PANEL - Abnormal; Notable for the following components:      Result Value   Glucose, Bld 175 (*)    Creatinine, Ser 1.58 (*)    GFR calc non Af Amer 46 (*)    GFR calc Af Amer 53 (*)    All other components within normal limits  CBC  WITH DIFFERENTIAL/PLATELET - Abnormal; Notable for the following components:   WBC 10.9 (*)    Neutro Abs 8.0 (*)    Abs Immature Granulocytes 0.13 (*)    All other components within normal limits  I-STAT TROPONIN, ED    EKG EKG Interpretation  Date/Time:  Tuesday October 01 2018 12:20:54 EST Ventricular Rate:  109 PR Interval:    QRS Duration: 115 QT Interval:  399 QTC Calculation: 562 R Axis:   -119 Text Interpretation:  Atrial flutter Ventricular bigeminy Incomplete right bundle branch block Inferior infarct, old Anterior infarct, old rate not as fast as oct 2019 Confirmed by Sherwood Gambler [phone]) on 10/01/2018 12:44:10 PM Also confirmed by Sherwood Gambler [id number]), editor Philomena Doheny [phone])  on 10/01/2018 3:56:53 PM   Radiology Dg Chest 2 View  Result Date: 10/01/2018 CLINICAL DATA:  Chest pain tachycardia EXAM: CHEST - 2 VIEW COMPARISON:  07/11/2018 FINDINGS: Prominent lung markings are present bilaterally right greater than left mostly in the bases. These have been present on prior studies including chest CT of 05/17/2018 consistent with fibrosis. Negative for superimposed acute infiltrate or effusion.  No edema. IMPRESSION: Chronic lung disease with interstitial fibrosis similar to prior studies. No superimposed acute abnormality. Electronically Signed   By: Franchot Gallo M.D.   On: 10/01/2018 13:22    Procedures Procedures (including critical care time)  Medications Ordered in ED Medications  diltiazem (CARDIZEM) tablet 30 mg (has no administration in time range)  digoxin (LANOXIN) 0.25 MG/ML injection 0.25 mg (has no administration in time range)    Followed by  digoxin (LANOXIN) tablet 0.125 mg (has no administration in time range)  diltiazem (CARDIZEM) 1 mg/mL  load via infusion 10 mg (10 mg Intravenous Bolus from Bag 10/01/18 1329)  sodium chloride 0.9 % bolus 500 mL (0 mLs Intravenous Stopped 10/01/18 1449)  diltiazem (CARDIZEM) injection 10 mg (10 mg  Intravenous Bolus 10/01/18 1445)     Initial Impression / Assessment and Plan / ED Course  I have reviewed the triage vital signs and the nursing notes.  Pertinent labs & imaging results that were available during my care of the patient were reviewed by me and considered in my medical decision making (see chart for details).  Clinical Course as of Oct 01 1642  Tue Oct 01, 2018  1443 Spoke with Wannetta Sender, Cards master. Someone will come evaluate.    [SJ]    Clinical Course User Index [SJ] Joy, Shawn C, PA-C    Patient presents with chest pain shortness of breath.  Found to be tachycardic with irregularly irregular rhythm.  Nontoxic-appearing.  Maintaining adequate SPO2 on home supplemental O2.  Patient's tachycardia not responsive to diltiazem.  Admitted by cardiology  Findings and plan of care discussed with Sherwood Gambler, MD. Dr. Regenia Skeeter personally evaluated and examined this patient.  Final Clinical Impressions(s) / ED Diagnoses   Final diagnoses:  Atrial flutter with rapid ventricular response [facility])    ED Discharge Orders    None       Lorayne Bender, PA-C 10/01/18 1636    Lorayne Bender, PA-C 10/01/18 1643    Sherwood Gambler, MD 10/02/18 [phone]

## 2018-10-01 NOTE — ED Triage Notes (Signed)
Pt sent by PCP for low BP and high HR. Pt c.o ongoing intermittent chest tightness for the past few weeks. Hx of pulmonary fibrosis, on 6L [place] all the time.

## 2018-10-02 ENCOUNTER — Telehealth: Payer: Self-pay | Admitting: Internal Medicine

## 2018-10-02 DIAGNOSIS — I4892 Unspecified atrial flutter: Secondary | ICD-10-CM | POA: Diagnosis not present

## 2018-10-02 DIAGNOSIS — I484 Atypical atrial flutter: Secondary | ICD-10-CM | POA: Diagnosis not present

## 2018-10-02 LAB — CBC
HCT: 46.9 % (ref 39.0–52.0)
Hemoglobin: 14.9 g/dL (ref 13.0–17.0)
MCH: 27.6 pg (ref 26.0–34.0)
MCHC: 31.8 g/dL (ref 30.0–36.0)
MCV: 87 fL (ref 80.0–100.0)
Platelets: 242 10*3/uL (ref 150–400)
RBC: 5.39 MIL/uL (ref 4.22–5.81)
RDW: 13.3 % (ref 11.5–15.5)
WBC: 7.9 10*3/uL (ref 4.0–10.5)
nRBC: 0 % (ref 0.0–0.2)

## 2018-10-02 LAB — BASIC METABOLIC PANEL
Anion gap: 10 (ref 5–15)
BUN: 15 mg/dL (ref 8–23)
CHLORIDE: 105 mmol/L (ref 98–111)
CO2: 25 mmol/L (ref 22–32)
Calcium: 8.8 mg/dL — ABNORMAL LOW (ref 8.9–10.3)
Creatinine, Ser: 1.17 mg/dL (ref 0.61–1.24)
GFR calc Af Amer: >60 mL/min (ref >60)
GFR calc non Af Amer: >60 mL/min (ref >60)
Glucose, Bld: 84 mg/dL (ref 70–99)
Potassium: 3.3 mmol/L — ABNORMAL LOW (ref 3.5–5.1)
SODIUM: 140 mmol/L (ref 135–145)

## 2018-10-02 LAB — GLUCOSE, CAPILLARY
GLUCOSE-CAPILLARY: 143 mg/dL — AB (ref 70–99)
Glucose-Capillary: 67 mg/dL — ABNORMAL LOW (ref 70–99)
Glucose-Capillary: 135 mg/dL — ABNORMAL HIGH (ref 70–99)
Glucose-Capillary: 220 mg/dL — ABNORMAL HIGH (ref 70–99)

## 2018-10-02 LAB — TSH: TSH: 2.598 u[IU]/mL (ref 0.350–4.500)

## 2018-10-02 MED ORDER — POTASSIUM CHLORIDE CRYS ER 20 MEQ PO TBCR
40.0000 meq | EXTENDED_RELEASE_TABLET | Freq: Once | ORAL | Status: AC
  Administered 2018-10-02: 40 meq via ORAL
  Filled 2018-10-02: qty 2

## 2018-10-02 MED ORDER — DILTIAZEM HCL ER COATED BEADS 300 MG PO CP24: 300.0000 mg | ORAL_CAPSULE | Freq: Every day | ORAL | 6 refills | Status: DC

## 2018-10-02 MED ORDER — DIGOXIN 125 MCG PO TABS: 0.1250 mg | ORAL_TABLET | Freq: Every day | ORAL | 6 refills | Status: DC

## 2018-10-02 MED ORDER — DILTIAZEM HCL ER COATED BEADS 180 MG PO CP24: 300.0000 mg | ORAL_CAPSULE | Freq: Every day | ORAL | Status: DC

## 2018-10-02 NOTE — Consult Note (Addendum)
Cardiology Consultation:   Patient ID: Jerry Montgomery MRN: [id number]; DOB: May 29, 1956  Admit date: 10/01/2018 Date of Consult: 10/02/2018  Primary Care Provider: [name], MD Primary Cardiologist: Dr. Claiborne Billings Primary Electrophysiologist:  Dr. Lovena Le (last 2016)   Patient Profile:   Jerry Montgomery is a 62 y.o. male with a hx of CAD (PCI to LAD in 2004, Cx in 2011), HTN, HLD, DM, chronic CHF (diastolic), COPD, idiopathic Pulmonary fibrosis w/chronic hypoxic respiratory failure, (on home O2, follows with JerryRamaswamy), WPW/SVT (ablated 2016), and AFib/flutter who is being seen today for the evaluation of AFib w/RVR, difficult to control at the request of Jerry Montgomery.  History of Present Illness:   Record reports his AF history as the following:  Jerry Montgomery was first found with AF initially in 2016, though quite until 07/11/18 via his [place] app noted frequent AF with RVR and the patient noted fatigue and SOB. In the ED he was noted to be in NSR. He had CHF on his CXR. It was suggested he increase his Lasix to 20 mg daily and his Toprol to 100 mg daily with an extra 50 mg Q pm for sustained HR > 130.  He was seen in the office 07/23/18 for follow up and was doing OK. He was in NSR.  He still noticed his HR > 130 but only for short periods, he was otherwise unaware of any tachycardia.  He was readmitted 08/07/18 with PAF with RVR when it was noted his HR was 130 while he was getting his pulmonary fibrosis experimental drug infusion as an OP (reportedly without symptoms), though reported that he was in AF prior to the infusion.  He was seen by EP during that stay, not felt an AAD candidate (given comorbid conditions/limits and baseline QT >436m) and recommended rate control strategy. He was discharged 08/09/18 (he had become frustrated with [place] he felt like nothing was being done). His metoprolol was increased to 100 mg BID (it had been decreased in the past secondary to bradycardia and  fatigue with a HR in the 50s). He called Dr PDennard Schaumannon 10/22 to relate his HR was still elevated on his KARDI app and Dr PDennard Schaumannadded Diltiazem 180 mg.  [facility], reported ongoing issues with high HR via his KARDIA monitor, [place] in [place].  Record indicates pulmonary-wise, he failed nintedanib and pirfenidone because of side effects. He has been on an experimental infusion but otherwise supportive care measures. He has not been interested in pulmonary transplant per notes.   He was admitted yesterday at the recommendation of his PMD for tachycardia. He has no obvious symptoms 2/2 to his AF or RVR, notes this by his KARD[facility] APP only   LABS K+ 3.7 > 3.3 BUN/Creat 15/1.17 WBC 7.9 H/H 14/46 Plts 242 TSH 2.598  Past Medical History:  Diagnosis Date  . Acute diastolic heart failure ([facility]   . Atrial fibrillation (HCC)    vs flutter  . Atrial flutter ([facility]   . Atrial tachycardia ([facility]   . CAD (coronary artery disease)    a. PCI of prox LAD and Cx in 2004. b. PCI to Cx 2008. c. PCI to ISR of Cx in 2011.  . Cardiomyopathy ([place]    a. EF 45-50% in 06/2018.  .Marland KitchenChronic respiratory failure with hypoxia, on home O2 therapy ([facility]   . CKD (chronic kidney disease), stage III ([place]   . COPD (chronic obstructive pulmonary disease) ([facility]   .  Emphysematous bleb ([place])    a.  s/p pleurectomy/pleurocentesis 2011  . Hyperlipidemia   . Hypertension   . Hypothyroidism   . Idiopathic pulmonary fibrosis ([place])   . On home oxygen therapy    "3L; 24/7" (04/15/2015)  . Pneumothorax on right 4/11  . Postinflammatory pulmonary fibrosis ([place])   . Sleep apnea    "won't use CPAP" (04/14/2105)  . SVT (supraventricular tachycardia) ([place])    Per Dr. Tanna Furry note  . Type II diabetes mellitus ([place])   . WPW (Wolff-Parkinson-White syndrome)    Per Dr. Tanna Furry Note    Past Surgical History:  Procedure Laterality Date  . BILATERAL VATS ABLATION Right   . CARDIAC  CATHETERIZATION  05/03/2007   patent stents  . CARDIAC CATHETERIZATION N/A 05/18/2015   Procedure: Right Heart Cath;  Surgeon: Belva Crome, MD;  Location: [facility];  Service: Cardiovascular;  Laterality: N/A;  . CORONARY ANGIOPLASTY WITH STENT PLACEMENT  03/05/2003; 2008; 01/13/2010   PCI & Stent  LAD & mid CX; stent to CX  (I've got 4 stents total" (04/15/2015)  . ELECTROPHYSIOLOGIC STUDY N/A 04/14/2015   Procedure: SVT Ablation;  Surgeon: Evans Lance, MD;  Location: [facility];  Service: Cardiovascular;  Laterality: N/A;  . KNEE ARTHROSCOPY Right 01/22/2013   Procedure: RIGHT ARTHROSCOPY KNEE WITH DEBRIDEMENT, abrasion chondroplasty of lateral tibial plateau, debridement of partial tear of ACL, menisectomy;  Surgeon: Tobi Bastos, MD;  Location: [facility];  Service: Orthopedics;  Laterality: Right;  . KNEE SURGERY  2012   "reattached quad"  . LUNG LOBECTOMY Right   . SHOULDER ARTHROSCOPY W/ ROTATOR CUFF REPAIR Bilateral   . VIDEO ASSISTED THORACOSCOPY Left 07/19/2015   Procedure: LEFT VIDEO ASSISTED THORACOSCOPY WITH LEFT UPPER AND LOWER LUNG BIOPSY;  Surgeon: Melrose Nakayama, MD;  Location: [facility];  Service: Thoracic;  Laterality: Left;  Marland Kitchen VIDEO BRONCHOSCOPY N/A 07/19/2015   Procedure: VIDEO BRONCHOSCOPY WITH FLUORO;  Surgeon: Melrose Nakayama, MD;  Location: [facility];  Service: Thoracic;  Laterality: N/A;     Home Medications:  Prior to Admission medications   Medication Sig Start Date End Date Taking? Authorizing Provider  ALPRAZolam Duanne Moron) 0.5 MG tablet Take 1 tablet (0.5 mg total) by mouth at bedtime as needed for anxiety. 07/27/17  Yes [name], MD  apixaban (ELIQUIS) 5 MG TABS tablet Take 5 mg by mouth 2 (two) times daily.   Yes [provider]  aspirin 81 MG tablet Take 81 mg by mouth every morning.    Yes [provider]  atorvastatin (LIPITOR) 40 MG tablet Take 1 tablet by mouth every night at bedtime 09/03/18  Yes [name],  MD  Cholecalciferol (VITAMIN D-3 PO) Take 1 capsule by mouth daily.   Yes [provider]  Cyanocobalamin (VITAMIN B-12 PO) Take 1 tablet by mouth daily.   Yes [provider]  dapagliflozin propanediol (FARXIGA) 10 MG TABS tablet Take 10 mg by mouth daily. 02/07/18  Yes [name], MD  diltiazem (CARDIZEM CD) 180 MG 24 hr capsule TAKE 1 CAPSULE BY MOUTH EVERY DAY Patient taking differently: Take 180 mg by mouth daily.  09/03/18  Yes [name], MD  furosemide (LASIX) 20 MG tablet Take 1 tablet (20 mg total) by mouth daily as needed for fluid (fluid). Patient taking differently: Take 20 mg by mouth daily.  07/08/18  Yes [name], MD  insulin aspart (NOVOLOG) 100 UNIT/ML injection 15 UNITS WITH  BREAKFAST AND 15 UNITS WITH LUNCH, 20 UNITS WITH SUPPER Patient taking differently: Inject 15-30 Units into the skin See admin instructions. Inject 15 units into the skin after breakfast, 15 units after lunch, and 30 units after supper 02/07/18  Yes [name], MD  Investigational - Study Medication "Title: [phone] (FibroGen Study) is a Phase 3, randomized, double-blind, placebo-controlled multicenter international study to evaluate evaluate the efficacy and safety of 30 mg/kg IV infusions of pamrevlumab administered every 3 weeks for 52 weeks as compared to placebo in subjects with Idiopathic Pulmonary Fibrosis. Primary end point is: change in FVC from baseline at week 52"   Yes [provider]  LANTUS 100 UNIT/ML injection INJECT 0.6 MLS (60 UNITS TOTAL) INTO THE SKIN AT BEDTIME. Patient taking differently: Inject 60 Units into the skin at bedtime.  09/23/18  Yes [name], MD  levothyroxine (SYNTHROID, LEVOTHROID) 125 MCG tablet Take 1 tablet (125 mcg total) by mouth daily. 07/08/18  Yes [name], MD  MAGNESIUM PO Take 1 tablet by mouth daily.   Yes [provider]  Menthol, Topical Analgesic, (BIOFREEZE EX) Apply 1  application topically as needed (for pain).   Yes [provider]  metFORMIN (GLUCOPHAGE) 850 MG tablet Take 1 tablet by mouth twice a day Patient taking differently: Take 850 mg by mouth 2 (two) times daily with a meal.  04/08/18  Yes [name], MD  metoprolol tartrate (LOPRESSOR) 100 MG tablet Take 1 tablet (100 mg total) by mouth 2 (two) times daily. 08/09/18  Yes Daune Perch, NP  nitroGLYCERIN (NITROSTAT) 0.4 MG SL tablet Place 1 tablet (0.4 mg total) under the tongue every 5 (five) minutes as needed for chest pain. 08/12/18  Yes [name], MD  omega-3 acid ethyl esters (LOVAZA) 1 g capsule Take 2 capsules (2 g total) by mouth 2 (two) times daily. 03/07/18  Yes Troy Sine, MD  OXYGEN Inhale 6-8 L into the lungs continuous.   Yes [provider]  POTASSIUM PO Take 1 tablet by mouth daily.   Yes [provider]  zolpidem (AMBIEN) 10 MG tablet Take 1 tablet by mouth at bedtime. 09/03/18  Yes [name], MD  blood glucose meter kit and supplies KIT Dispense based on patient and insurance preference. Use up to four times daily as directed. (FOR ICD E11.65) 11/24/15   [name], MD  Blood Glucose Monitoring Suppl (ONE TOUCH ULTRA 2) w/Device KIT Use to monitor FSBS 4x daily for fluctuating blood sugars. Dx: E11.65. Patient taking differently: 1 each See admin instructions. Use to monitor FSBS 4x daily for fluctuating blood sugars. Dx: E11.65 02/26/17   [name], MD  Blood Glucose Monitoring Suppl (ONE TOUCH ULTRA SYSTEM KIT) w/Device KIT Use to monitor FSBS 4x daily for fluctuating blood sugars. Dx: E11.65. 01/04/16   Alycia Rossetti, MD  ONE TOUCH ULTRA TEST test strip Use to monitor FSBS 4x daily for fluctuating blood sugars. Dx: E11.65. Patient taking differently: 1 each by Other route See admin instructions. Use to monitor FSBS 4x daily for fluctuating blood sugars. Dx: E11.65 11/09/17   [name], MD  [facility] DELICA  LANCETS FINE MISC Test up to 4 times a day as directed. Patient taking differently: 1 each See admin instructions. Test up to 4 times a day as directed 11/10/16   [name], MD  SYRINGE/NEEDLE, DISP, 1 ML (B-D SYRINGE/NEEDLE 1CC/25GX5/8) 25G X 5/8" 1 ML MISC As directed 07/27/17  [name], MD    Inpatient Medications: Scheduled Meds: . apixaban  5 mg Oral BID  . aspirin EC  81 mg Oral Daily  . atorvastatin  40 mg Oral QHS  . canagliflozin  100 mg Oral QAC breakfast  . digoxin  0.125 mg Oral Daily  . diltiazem  240 mg Oral Daily  . furosemide  20 mg Oral Daily  . insulin aspart  15 Units Subcutaneous BID WC  . insulin aspart  15-30 Units Subcutaneous Q supper  . insulin glargine  60 Units Subcutaneous QHS  . levothyroxine  125 mcg Oral Daily  . metoprolol tartrate  100 mg Oral BID  . omega-3 acid ethyl esters  2 g Oral BID  . sodium chloride flush  3 mL Intravenous Q12H  . zolpidem  10 mg Oral QHS   Continuous Infusions: . sodium chloride Stopped (10/02/18 0400)   PRN Meds: sodium chloride, acetaminophen, ALPRAZolam, nitroGLYCERIN, ondansetron (ZOFRAN) IV, sodium chloride flush  Allergies:    Allergies  Allergen Reactions  . Niaspan [Niacin] Itching  . Ofev [Nintedanib] Other (See Comments)    "Lethargic and wiped out"  . Pirfenidone Other (See Comments)    Caused the patient to unexpectedly wet the bed in the middle of the night (was never a problem prior to taking this med)  . Ramipril Cough         Social History:   Social History   Socioeconomic History  . Marital status: Single    Spouse name: Not on file  . Number of children: 1  . Years of education: Not on file  . Highest education level: Not on file  Occupational History    Employer: [employer]  . Financial resource strain: Not hard at all  . Food insecurity:    Worry: Never true    Inability: Never true  . Transportation needs:    Medical: No    Non-medical: No    Tobacco Use  . Smoking status: Former Smoker    Packs/day: 1.50    Years: 35.00    Pack years: 52.50    Types: Cigarettes    Last attempt to quit: 10/24/2007    Years since quitting: 10.9  . Smokeless tobacco: Never Used  Substance and Sexual Activity  . Alcohol use: Yes    Alcohol/week: 0.0 standard drinks    Comment: 04/15/2015 "I've had a couple drinks in the last 8 months"  . Drug use: No  . Sexual activity: Not Currently  Lifestyle  . Physical activity:    Days per week: 0 days    Minutes per session: 0 min  . Stress: Not at all  Relationships  . Social connections:    Talks on phone: Once a week    Gets together: More than three times a week    Attends religious service: Never    Active member of club or organization: No    Attends meetings of clubs or organizations: Never    Relationship status: Not on file  . Intimate partner violence:    Fear of current or ex partner: Not on file    Emotionally abused: Not on file    Physically abused: Not on file    Forced sexual activity: Not on file  Other Topics Concern  . Not on file  Social History Narrative  . Not on file    Family History:    Family History  Problem Relation Age of Onset  . Breast  cancer Mother   . Diabetes type II Mother   . Lung cancer Father      ROS:  Please see the history of present illness.  All other ROS reviewed and negative.     Physical Exam/Data:   Vitals:   10/01/18 1800 10/01/18 1835 10/01/18 2302 10/02/18 0636  BP: 119/86 123/90 137/89 (!) 121/91  Pulse: (!) 134 (!) 135  (!) 132  Resp: (!) _0 Temp:  98.3 F (36.8 C)  98.5 F (36.9 C)  TempSrc:  Oral  Oral  SpO2: 93% 94% 96% 95%  Weight:  112 kg  110.6 kg  Height:  6' 1" (1.854 m)      Intake/Output Summary (Last 24 hours) at 10/02/2018 1048 Last data filed at 10/02/2018 0400 Gross per 24 hour  Intake 500 ml  Output -  Net 500 ml   Filed Weights   10/01/18 1835 10/02/18 0636  Weight: 112 kg 110.6 kg    Body mass index is 32.17 kg/m.  General:  Well nourished, well developed, in no acute distress HEENT: normal Lymph: no adenopathy Neck: no JVD Endocrine:  No thryomegaly Vascular: No carotid bruits Cardiac:  irreg-irreg; no murmurs, gallops or rubs Lungs:  Very fine crackles, no wheezing, rhonchi or rales  Abd: soft, nontender  Ext: no edema Musculoskeletal:  No deformities Skin: warm and dry  Neuro:  No gross focal abnormalities noted Psych:  Normal affect   EKG:  The EKG was personally reviewed and demonstrates:   AFlutter 109bpm, PVCs, 109bpm AFlutter 2:1, 134bpm Telemetry:  Telemetry was personally reviewed and demonstrates:   Currently AFlutter 70's with occ PVCs  Relevant CV Studies:  07/18/18: TTE Study Conclusions - Left ventricle: The cavity size was normal. There was mild   concentric hypertrophy. Systolic function was mildly reduced. The   estimated ejection fraction was in the range of 45% to 50%.   Although no diagnostic regional wall motion abnormality was   identified, this possibility cannot be completely excluded on the   basis of this study. Features are consistent with a pseudonormal   left ventricular filling pattern, with concomitant abnormal   relaxation and increased filling pressure (grade 2 diastolic   dysfunction). - Mitral valve: Calcified annulus. Systolic bowing without   prolapse. - Left atrium: The atrium was mildly dilated. - Right ventricle: The cavity size was mildly dilated. Systolic   function was mildly reduced.  Laboratory Data:  Chemistry Recent Labs  Lab 10/01/18 1232 10/02/18 0445  NA 138 140  K 3.7 3.3*  CL 102 105  CO2 22 25  GLUCOSE 175* 84  BUN 17 15  CREATININE 1.58* 1.17  CALCIUM 8.9 8.8*  GFRNONAA 46* >60  GFRAA 53* >60  ANIONGAP 14 10    No results for input(s): PROT, ALBUMIN, AST, ALT, ALKPHOS, BILITOT in the last 168 hours. Hematology Recent Labs  Lab 10/01/18 1232 10/02/18 0445  WBC 10.9* 7.9   RBC 5.69 5.39  HGB 15.5 14.9  HCT 50.9 46.9  MCV 89.5 87.0  MCH 27.2 27.6  MCHC 30.5 31.8  RDW 13.3 13.3  PLT 266 242   Cardiac EnzymesNo results for input(s): TROPONINI in the last 168 hours.  Recent Labs  Lab 10/01/18 1239  TROPIPOC 0.01    BNPNo results for input(s): BNP, PROBNP in the last 168 hours.  DDimer No results for input(s): DDIMER in the last 168 hours.  Radiology/Studies:  Dg Chest 2 View Result Date: 10/01/2018 CLINICAL DATA:  Chest pain tachycardia EXAM: CHEST - 2 VIEW COMPARISON:  07/11/2018 FINDINGS: Prominent lung markings are present bilaterally right greater than left mostly in the bases. These have been present on prior studies including chest CT of 05/17/2018 consistent with fibrosis. Negative for superimposed acute infiltrate or effusion.  No edema. IMPRESSION: Chronic lung disease with interstitial fibrosis similar to prior studies. No superimposed acute abnormality. Electronically Signed   By: Franchot Gallo M.D.   On: 10/01/2018 13:22    Assessment and Plan:   1. AFlutter with RVR, is known to have Afib as well.    CHA2DS2Vasc is 4, on Eliquis, appropriately dosed     Difficult case.  He has no good AAD options given IPF, CAD, baseline QT is too long for Tikosyn/sotalol.  His lung disease/chronic hypoxic resp failure will make rhythm control without an AAD impossible most likely, (doubt Multaq would be effective in him), as well as make rate control strategy challenging.  Symptom-wise, he is largely unaware of his tachycardia or AFib, only noting it by checking his HR.  He has baseline generalized fatigue and SOB, this is unchanged now with rate controlled AFlutter, in SR or with his tachycardia.  Would continue current regime, follow telemetry, and if with OOB his rates remain better can discharge. Will have him f/u with the AFib clinic to have an alternative option to the ER especially without symptoms.  He may eventually need pace/ablate if unable  to gain good HR control, or pacer if we run into bradycardia.  2. Hypokalemia     Replacement ordered   For questions or updates, please contact [place] Please consult [email or web address] for contact info under     Signed, Baldwin Jamaica, PA-C  10/02/2018 10:48 AM   I have seen and examined this patient with Tommye Standard.  Agree with above, note added to reflect my findings.  On exam, irregular, no murmurs, lungs clear.  Patient with atrial flutter and significant tachycardia. Minimally symptomatic. Has IPF and thus not a candidate for amiodarone. Has CAD and prolonged QTc. Will attempt rate control. Have increase diltiazem and digoxin with improvement in HR into the 70s. Should he ambulate without issue, would be OK to discharge home.   Will M. Camnitz MD 10/02/2018 1:48 PM

## 2018-10-02 NOTE — Telephone Encounter (Signed)
PI OVERSIGHT ATTESTATION  I the Principal Investigator (PI) for the above mentioned study attest that I reviewed the above mentioned clinical research coordinator  notes on research subject  Jerry NewtonRobert S Montgomery  born 07/25/1956 . I  agree with the findings mentioned above. Specifically  1. Informed in morning huddle 7.50am 10/02/2018   2. REviewed chart with CRC approximately 13.25 10/02/2018 -> 2 AE noted - primary is REcurrent Rapid Atrial flutter - onset Friday 09/27/18 - based on 4d hx he gave in ER 10/01/18.  Secondary is - hypokalemia, mild noted 10/01/18 at admit. Both are in my view as PI, unrelated to study drug.   Plan  - inform sponsor 10/02/2018 about a Flutter  - get sponsor clarification if they want hypokalemia in a separate SAE form        Dr. Kalman ShanMurali Hardy Harcum, M.D., F.C.C.P., ACRP-CPI Pulmonary and Critical Care Medicine Principal Investigator & Staff Physician [facility]  [place] [facility] Pager: [phone], If no answer or between  15:00h - 7:00h: call [phone]  10/02/2018 1:37 PM

## 2018-10-02 NOTE — Discharge Summary (Addendum)
DISCHARGE SUMMARY    Patient ID: Jerry Montgomery,  MRN: [id number], DOB/AGE: [birth year] 62 y.o.  Admit date: 10/01/2018 Discharge date: 10/02/2018  Primary Care Physician: Jerry Frizzle, MD  Primary Cardiologist: Dr. Claiborne Montgomery Electrophysiologist: Dr. Lovena Montgomery  Primary Discharge Diagnosis:  1. Aflutter w/RVR  Secondary Discharge Diagnosis:  1. IPF     COPD     Chronic hypoxic respiratory failure, on chronic O2 2. CAD 3. HTN 4. HLD 5. DM  Allergies  Allergen Reactions  . Niaspan [Niacin] Itching  . Ofev [Nintedanib] Other (See Comments)    "Lethargic and wiped out"  . Pirfenidone Other (See Comments)    Caused the patient to unexpectedly wet the bed in the middle of the night (was never a problem prior to taking this med)  . Ramipril Cough          Procedures This Admission:  None   Brief HPI: Jerry PAGLIUCA is a 62 y.o. male was referred to the ER via his PMD for AF w/RVR.  Hospital Course:  The patient has had of late worsening rate control, he connects to the onset of his investigational drug for his IPF.  He is unaware of his tachycardia symptom-wise, though monitors his HR/rhythm regularly and daily and by his phone APP has observed at time weeks of fats rates.    He was admitted his home medicines continued, his diltiazem dose increased and digoxin added.  EP was called to the case. Symptom-wise, he is largely/completely unaware of his tachycardia or AFib, only noting it by checking his HR.  He has baseline generalized fatigue and SOB, this is unchanged now with rate controlled AFlutter, in SR or with his tachycardia.   He is not felt to have any real or good AAD options and rate control strategies are recommended.  Record mentions historically having to have had is BB reduced 2/2 bradycardia, though unclear when this occurred.  Discussion of possible need for PPM implant either for rate control with AV node ablation or perhaps tachy brady if bradycardia is  again encountered.  The patient ambulated, felt well, though did get more SOB and noted that his O2 tank was off.  Outside of this he felt at his baseline. Telemetry noted a couple brief RVR episodes, though were quite brief.  Otherwise rates maintained 70's-80's, no bradycardia. Initially plan was to give up-titrated dose of his diltiazem in the morning, and discharge if rates OK.  The patient mentions though he cares for his wife with dementia and being her gives him significant anxiety and worry about this. Dr. Curt Montgomery had a lengthy discussion with the patient, Jerry Montgomery discharge with diltiazem 343m daily and the digoxin with early follow up in the AFib clinic within a week, and Dr. TLovena Lea few weeks after that.  We dicussed the AFib clinic as a resource for him.  He is very good about keeping track of his HR.  He is urged to reach out to the AFib clinic if his HR sustains >120 for more then 24 hours.  Hopefully preventing hospitalizations.  The patient feels well, HRs for the majority of today have been controlled with only brief rapid rates, he feels well has been seen by Dr. CCurt Bearsand considered stable for discharge to home.    Physical Exam: Vitals:   10/01/18 2302 10/02/18 0636 10/02/18 1153 10/02/18 1643  BP: 137/89 (!) 121/91 (!) 115/97 137/89  Pulse:  (!) 132 66 68  Resp:  _0 Temp:  98.5 F (36.9 C) 97.8 F (36.6 C) 98.1 F (36.7 C)  TempSrc:  Oral Oral Oral  SpO2: 96% 95% 93% 95%  Weight:  110.6 kg    Height:         Labs:   Lab Results  Component Value Date   WBC 7.9 10/02/2018   HGB 14.9 10/02/2018   HCT 46.9 10/02/2018   MCV 87.0 10/02/2018   PLT 242 10/02/2018    Recent Labs  Lab 10/02/18 0445  NA 140  K 3.3*  CL 105  CO2 25  BUN 15  CREATININE 1.17  CALCIUM 8.8*  GLUCOSE 84    Discharge Medications:  Allergies as of 10/02/2018      Reactions   Niaspan [niacin] Itching   Ofev [nintedanib] Other (See Comments)   "Lethargic and wiped out"    Pirfenidone Other (See Comments)   Caused the patient to unexpectedly wet the bed in the middle of the night (was never a problem prior to taking this med)   Ramipril Cough         Medication List    TAKE these medications   ALPRAZolam 0.5 MG tablet Commonly known as:  XANAX Take 1 tablet (0.5 mg total) by mouth at bedtime as needed for anxiety.   aspirin 81 MG tablet Take 81 mg by mouth every morning.   atorvastatin 40 MG tablet Commonly known as:  LIPITOR Take 1 tablet by mouth every night at bedtime   BIOFREEZE EX Apply 1 application topically as needed (for pain).   blood glucose meter kit and supplies Kit Dispense based on patient and insurance preference. Use up to four times daily as directed. (FOR ICD E11.65)   dapagliflozin propanediol 10 MG Tabs tablet Commonly known as:  FARXIGA Take 10 mg by mouth daily.   digoxin 0.125 MG tablet Commonly known as:  LANOXIN Take 1 tablet (0.125 mg total) by mouth daily. Start taking on:  10/03/2018   diltiazem 300 MG 24 hr capsule Commonly known as:  CARDIZEM CD Take 1 capsule (300 mg total) by mouth daily. Start taking on:  10/03/2018 What changed:    medication strength  how much to take   ELIQUIS 5 MG Tabs tablet Generic drug:  apixaban Take 5 mg by mouth 2 (two) times daily.   furosemide 20 MG tablet Commonly known as:  LASIX Take 1 tablet (20 mg total) by mouth daily as needed for fluid (fluid). What changed:  when to take this   insulin aspart 100 UNIT/ML injection Commonly known as:  novoLOG 15 UNITS WITH BREAKFAST AND 15 UNITS WITH LUNCH, 20 UNITS WITH SUPPER What changed:    how much to take  how to take this  when to take this  additional instructions   Investigational - Study Medication "Title: [id number] (FibroGen Study) is a Phase 3, randomized, double-blind, placebo-controlled multicenter international study to evaluate evaluate the efficacy and safety of 30 mg/kg IV infusions of  pamrevlumab administered every 3 weeks for 52 weeks as compared to placebo in subjects with Idiopathic Pulmonary Fibrosis. Primary end point is: change in FVC from baseline at week 52"   LANTUS 100 UNIT/ML injection Generic drug:  insulin glargine INJECT 0.6 MLS (60 UNITS TOTAL) INTO THE SKIN AT BEDTIME. What changed:  See the new instructions.   levothyroxine 125 MCG tablet Commonly known as:  SYNTHROID, LEVOTHROID Take 1 tablet (125 mcg total) by mouth daily.   MAGNESIUM PO Take 1  tablet by mouth daily.   metFORMIN 850 MG tablet Commonly known as:  GLUCOPHAGE Take 1 tablet by mouth twice a day What changed:  when to take this   metoprolol tartrate 100 MG tablet Commonly known as:  LOPRESSOR Take 1 tablet (100 mg total) by mouth 2 (two) times daily.   nitroGLYCERIN 0.4 MG SL tablet Commonly known as:  NITROSTAT Place 1 tablet (0.4 mg total) under the tongue every 5 (five) minutes as needed for chest pain.   omega-3 acid ethyl esters 1 g capsule Commonly known as:  LOVAZA Take 2 capsules (2 g total) by mouth 2 (two) times daily.   ONE TOUCH ULTRA SYSTEM KIT w/Device Kit Use to monitor FSBS 4x daily for fluctuating blood sugars. Dx: E11.65. What changed:  Another medication with the same name was changed. Make sure you understand how and when to take each.   ONE TOUCH ULTRA 2 w/Device Kit Use to monitor FSBS 4x daily for fluctuating blood sugars. Dx: E11.65. What changed:  See the new instructions.   ONE TOUCH ULTRA TEST test strip Generic drug:  glucose blood Use to monitor FSBS 4x daily for fluctuating blood sugars. Dx: E11.65. What changed:  See the new instructions.   ONETOUCH DELICA LANCETS FINE Misc Test up to 4 times a day as directed. What changed:  See the new instructions.   OXYGEN Inhale 6-8 L into the lungs continuous.   POTASSIUM PO Take 1 tablet by mouth daily.   SYRINGE/NEEDLE (DISP) 1 ML 25G X 5/8" 1 ML Misc As directed   VITAMIN B-12 PO Take 1  tablet by mouth daily.   VITAMIN D-3 PO Take 1 capsule by mouth daily.   zolpidem 10 MG tablet Commonly known as:  AMBIEN Take 1 tablet by mouth at bedtime.       Disposition:  Home Discharge Instructions    Diet - low sodium heart healthy   Complete by:  As directed    Increase activity slowly   Complete by:  As directed      Follow-up Information    [facility].   Specialty:  Cardiology Why:  10/08/18 @ 9:30AM Contact information: [address] [id number] mc [place]       Evans Lance, MD Follow up.   Specialty:  Cardiology Why:  10/24/18 @ 3:00PM Contact information: [address] [place] [id number] [phone]           Duration of Discharge Encounter: Greater than 30 minutes including physician time.  Venetia Night, PA-C 10/02/2018 5:09 PM  I have seen and examined this patient with Tommye Standard.  Agree with above, note added to reflect my findings.  On exam, iRRR, no murmurs, lungs clear. Patient with atrial flutter and rapid rates. Rates have improved with diltiazem increase and digoxin. Takhia Spoon plan for discharge today on higher dose of diltiazem. Unfortunately antiarrhythmics are not optimal due to multiple medical issues. If he returns to fast AF/flutter, may be candidate for AV nodal ablation and pacemaker implant.    Yadriel Kerrigan M. Brinton Brandel MD 10/03/2018 7:14 AM

## 2018-10-02 NOTE — Progress Notes (Signed)
Pt alert and oriented in NAD. Pt verbalized understanding of discharge instructions. 

## 2018-10-02 NOTE — Discharge Instructions (Addendum)
IF YOU OBSERVE YOUR HEART RATE TO BE PERSISTENTLY FASTER THE 120, PLEASE CALL THE AFIB CLINIC FOR GUIDANCE AS WE DISCUSSED. IF YOU DEVELOP SYMPTOMS OF WEAKNESS, DIZZINESS, NEAR FAINTING OR FAINTING RETURN TO THE HOSPITAL, DO NOT DRIVE YOURSELF.    Information on my medicine - ELIQUIS (apixaban)  This medication education was reviewed with me or my healthcare representative as part of my discharge preparation.    Why was Eliquis prescribed for you? Eliquis was prescribed for you to reduce the risk of forming blood clots that can cause a stroke if you have a medical condition called atrial fibrillation (a type of irregular heartbeat) OR to reduce the risk of a blood clots forming after orthopedic surgery.  What do You need to know about Eliquis ? Take your Eliquis TWICE DAILY - one tablet in the morning and one tablet in the evening with or without food.  It would be best to take the doses about the same time each day.  If you have difficulty swallowing the tablet whole please discuss with your pharmacist how to take the medication safely.  Take Eliquis exactly as prescribed by your doctor and DO NOT stop taking Eliquis without talking to the doctor who prescribed the medication.  Stopping may increase your risk of developing a new clot or stroke.  Refill your prescription before you run out.  After discharge, you should have regular check-up appointments with your healthcare provider that is prescribing your Eliquis.  In the future your dose may need to be changed if your kidney function or weight changes by a significant amount or as you get older.  What do you do if you miss a dose? If you miss a dose, take it as soon as you remember on the same day and resume taking twice daily.  Do not take more than one dose of ELIQUIS at the same time.  Important Safety Information A possible side effect of Eliquis is bleeding. You should call your healthcare provider right away if you  experience any of the following: ? Bleeding from an injury or your nose that does not stop. ? Unusual colored urine (red or dark brown) or unusual colored stools (red or black). ? Unusual bruising for unknown reasons. ? A serious fall or if you hit your head (even if there is no bleeding).  Some medicines may interact with Eliquis and might increase your risk of bleeding or clotting while on Eliquis. To help avoid this, consult your healthcare provider or pharmacist prior to using any new prescription or non-prescription medications, including herbals, vitamins, non-steroidal anti-inflammatory drugs (NSAIDs) and supplements.  This website has more information on Eliquis (apixaban): [email or web address].

## 2018-10-02 NOTE — Progress Notes (Signed)
The patient ambulated in the hall, felt OK but about 1/2 way realized his O2 tank was off.  He thinks this may have contributed to his fast HR with walking.  Telemetry is reviewed, he has had a few episodes of RVR to 160's even at rest, though somewhat short duration.  In d/w Dr. Elberta Fortisamnitz, we will give 300mg  Diltiazem in the morning, and if HR controlled discharge tomorrow afternoon with early AFib clinic follow up.  Francis Dowseenee Ursuy, PA-C

## 2018-10-02 NOTE — Progress Notes (Signed)
MD paged regarding patient's CBG this am and continued elevated HR

## 2018-10-02 NOTE — Progress Notes (Signed)
Pt ambulated from room thru 49M. HR had been in the 70's at rest. He was in the bathroom at 99, then during the walk went as high as 162, but stayed in the 140's.

## 2018-10-02 NOTE — Telephone Encounter (Signed)
On 10DEC2019 I placed a call to Mr. Decoster DOB [id number] in an attempt to reschedule his upcoming week 18 research visit that was scheduled for Wednesday December 18th to Thursday December 19th. The patients' wife informed me that he was not home and that he had gone to see his doctor. It was at that point the coordinator reviewed the Medical record and learned that the subject had appeared to undergo another episode of atrial flutter. His PCP Dr. Lynnea FerrierWarren Pickard had noted Atrial fibrillation with Rapid Ventricular response. Noted that the subjects HR had been in the 130s to 150s and has experienced increasing shortness of breath. Patient has also experienced extremely low Blood pressure rates ranging from 88-95/60-70s. Dr. Tanya NonesPickard checked the patients BP in clinic on 10DEC2019 and noted it to be 90/70. It was the recommendation of the doctor that he go to the ER. While in the ER the subject underwent several EKGs, and had laboratory test performed and X-ray as well under the direction of Dr. Olga MillersBrian Crenshaw. It was the recommendation of the doctor based on his physical examination and supportive tests the patient be admitted into the hospital under the diagnosis of recurrent rapid atrial flutter. Currently the patient remains hospitalized.   With the information gathered it was learned at approximately 0800 this morning of the patients admission status. The Study Coordinator notified the PI Dr. Kalman ShanMurali Ramaswamy of the subjects status and also informed that an SAE report would be started and submitted to the Sponsor of the Fibrogen Clinical Trial. All pertinent information obtained to date will be reported to the sponsor and follow up reports will be submitted accordingly. For further Documentation regarding this encounter please refer to the subjects paper source binder.    Signed by  [name], BS CCRC, CPhT Clinical Research Coordinator [facility]  [place], [place] 12:31 PM 10/02/2018

## 2018-10-04 ENCOUNTER — Other Ambulatory Visit: Payer: Self-pay

## 2018-10-04 ENCOUNTER — Telehealth: Payer: Self-pay | Admitting: Family Medicine

## 2018-10-04 MED ORDER — APIXABAN 5 MG PO TABS: 5.0000 mg | ORAL_TABLET | Freq: Two times a day (BID) | ORAL | 3 refills | Status: DC

## 2018-10-04 NOTE — Patient Outreach (Signed)
[facility]) Care Management  [facility]  10/04/2018  Charlotta NewtonRobert S Fishburn 12/10/1955 [id number]  Reason for call: Follow up on patient assistance application.  It has [facility] been received by  [facility].  Unsuccessful telephone call attempt # 1 to patient.   Unable to leave message.  Plan:  I will make another outreach attempt to patient within 3-4 business days.  Berlin HunJennifer Demeco Ducksworth, PharmD Clinical Pharmacist [facility] [phone]

## 2018-10-04 NOTE — Telephone Encounter (Signed)
Patient needs his eliquis  [facility]

## 2018-10-04 NOTE — Telephone Encounter (Signed)
Medication called/sent to requested pharmacy  

## 2018-10-07 ENCOUNTER — Other Ambulatory Visit: Payer: Self-pay

## 2018-10-07 ENCOUNTER — Other Ambulatory Visit: Payer: Self-pay | Admitting: Family Medicine

## 2018-10-07 DIAGNOSIS — Z794 Long term (current) use of insulin: Principal | ICD-10-CM

## 2018-10-07 DIAGNOSIS — E1165 Type 2 diabetes mellitus with hyperglycemia: Secondary | ICD-10-CM

## 2018-10-07 NOTE — Patient Outreach (Signed)
[facility]) [facility]  10/07/2018  Jerry NewtonRobert S Montgomery 11/01/1955 [id number]  [facility] is being closed due to the following reasons:  Patient has not returned the patient assistance application for 2019.  He will need to meet the required out of pocket spending on prescriptions before he is eligible to apply in 2020.   [facility] provided [facility] contact information if assistance needed in the future.    Thank you for allowing [facility] be involved in this patient's care.    Berlin HunJennifer Ashden Sonnenberg, PharmD Clinical Pharmacist [facility] [phone]

## 2018-10-08 ENCOUNTER — Encounter (HOSPITAL_COMMUNITY): Payer: Self-pay | Admitting: Nurse Practitioner

## 2018-10-08 ENCOUNTER — Ambulatory Visit: Payer: Self-pay

## 2018-10-08 ENCOUNTER — Ambulatory Visit (HOSPITAL_COMMUNITY)
Admission: RE | Admit: 2018-10-08 | Discharge: 2018-10-08 | Disposition: A | Discharge status: Home / Self Care | Payer: PPO | Source: Ambulatory Visit | Attending: Nurse Practitioner | Admitting: Nurse Practitioner

## 2018-10-08 VITALS — BP 122/64 | HR 68 | Ht 73.0 in | Wt 251.0 lb

## 2018-10-08 DIAGNOSIS — R9431 Abnormal electrocardiogram [ECG] [EKG]: Secondary | ICD-10-CM | POA: Insufficient documentation

## 2018-10-08 DIAGNOSIS — J9611 Chronic respiratory failure with hypoxia: Secondary | ICD-10-CM | POA: Insufficient documentation

## 2018-10-08 DIAGNOSIS — Z87891 Personal history of nicotine dependence: Secondary | ICD-10-CM | POA: Diagnosis not present

## 2018-10-08 DIAGNOSIS — Z9981 Dependence on supplemental oxygen: Secondary | ICD-10-CM | POA: Diagnosis not present

## 2018-10-08 DIAGNOSIS — Z7901 Long term (current) use of anticoagulants: Secondary | ICD-10-CM | POA: Insufficient documentation

## 2018-10-08 DIAGNOSIS — Z888 Allergy status to other drugs, medicaments and biological substances status: Secondary | ICD-10-CM | POA: Diagnosis not present

## 2018-10-08 DIAGNOSIS — I440 Atrioventricular block, first degree: Secondary | ICD-10-CM | POA: Insufficient documentation

## 2018-10-08 DIAGNOSIS — E039 Hypothyroidism, unspecified: Secondary | ICD-10-CM | POA: Insufficient documentation

## 2018-10-08 DIAGNOSIS — E1122 Type 2 diabetes mellitus with diabetic chronic kidney disease: Secondary | ICD-10-CM | POA: Diagnosis not present

## 2018-10-08 DIAGNOSIS — I480 Paroxysmal atrial fibrillation: Secondary | ICD-10-CM | POA: Insufficient documentation

## 2018-10-08 DIAGNOSIS — Z79899 Other long term (current) drug therapy: Secondary | ICD-10-CM | POA: Insufficient documentation

## 2018-10-08 DIAGNOSIS — Z794 Long term (current) use of insulin: Secondary | ICD-10-CM | POA: Diagnosis not present

## 2018-10-08 DIAGNOSIS — I429 Cardiomyopathy, unspecified: Secondary | ICD-10-CM | POA: Diagnosis not present

## 2018-10-08 DIAGNOSIS — N183 Chronic kidney disease, stage 3 (moderate): Secondary | ICD-10-CM | POA: Diagnosis not present

## 2018-10-08 DIAGNOSIS — J449 Chronic obstructive pulmonary disease, unspecified: Secondary | ICD-10-CM | POA: Insufficient documentation

## 2018-10-08 DIAGNOSIS — Z7982 Long term (current) use of aspirin: Secondary | ICD-10-CM | POA: Insufficient documentation

## 2018-10-08 DIAGNOSIS — I4891 Unspecified atrial fibrillation: Secondary | ICD-10-CM | POA: Diagnosis not present

## 2018-10-08 DIAGNOSIS — Z79890 Hormone replacement therapy (postmenopausal): Secondary | ICD-10-CM | POA: Diagnosis not present

## 2018-10-08 DIAGNOSIS — I2510 Atherosclerotic heart disease of native coronary artery without angina pectoris: Secondary | ICD-10-CM | POA: Diagnosis not present

## 2018-10-08 DIAGNOSIS — I129 Hypertensive chronic kidney disease with stage 1 through stage 4 chronic kidney disease, or unspecified chronic kidney disease: Secondary | ICD-10-CM | POA: Diagnosis not present

## 2018-10-08 DIAGNOSIS — J84112 Idiopathic pulmonary fibrosis: Secondary | ICD-10-CM | POA: Insufficient documentation

## 2018-10-08 DIAGNOSIS — E785 Hyperlipidemia, unspecified: Secondary | ICD-10-CM | POA: Diagnosis not present

## 2018-10-08 NOTE — Progress Notes (Signed)
Primary Care Physician: [name], MD Referring Physician: [facility] f/u Cardiologist: Dr. Claiborne Billings EP: Dr. Desmond Dike is a 62 y.o. male with a h/o IPF, CAD,HTN, HLD, DM, chronic CHF (diastolic), COPD, idiopathic Pulmonary fibrosis w/chronic hypoxic respiratory failure, (on home O2, follows with Dr.Ramaswamy), WPW/SVT (ablated 2016), and AFib/flutter who was seen in consult  for the evaluation of difficult to control, AFib w/RVR.   He was initially found to be in afib in 2016.He was readmitted 08/07/18 with PAF with RVR when it was noted his HR was 130 while he was getting his pulmonary fibrosis experimental drug infusion as an OP (reportedly without symptoms), though reported that he was in AF prior to the infusion.  He was seen by EP during that stay, not felt an AAD candidate (given comorbid conditions/limits and baseline QT >470m) and recommended rate control strategy.His metoprolol was increased to 100 mg BID (it had been decreased in the past secondary to bradycardia and fatigue with a HR in the 50s). He called Dr PDennard Schaumannon 10/22 to relate his HR was still elevated on his KARDI app and Dr PDennard Schaumannadded Diltiazem 180 mg.  [facility] reported ongoing issues with high HR via his KARDIA monitor, [place].  Record indicates pulmonary-wise, he failednintedanib and pirfenidone because of side effects. He has been on an experimental infusionbut otherwise supportive care measures.He has not been interested in pulmonary transplant per notes.   He was admitted 12/10, at the recommendation of his PMD for tachycardia. He has no obvious symptoms 2/2 to his AF or RVR, notes this by his [place].  He is now in the afib clinic for f/u. He is in SR but he still notices HR of 130 at times, often in the am upon awaking. He had a positive sleep study years ago but could not use cpap.  Today, he denies symptoms of palpitations, chest pain,  shortness of breath, orthopnea, PND, lower extremity edema, dizziness, presyncope, syncope, or neurologic sequela. The patient is tolerating medications without difficulties and is otherwise without complaint today.   Past Medical History:  Diagnosis Date  . Acute diastolic heart failure (HHop Bottom   . Atrial fibrillation (HCC)    vs flutter  . Atrial flutter (HWinst[place]   . Atrial tachycardia (HGland[place]   . CAD (coronary artery disease)    a. PCI of prox LAD and Cx in 2004. b. PCI to Cx 2008. c. PCI to ISR of Cx in 2011.  . Cardiomyopathy (HG[place]    a. EF 45-50% in 06/2018.  .Marland KitchenChronic respiratory failure with hypoxia, on home O2 therapy (HPalominas   . CKD (chronic kidney disease), stage III (H[place]   . COPD (chronic obstructive pulmonary disease) (H[place]   . Emphysematous bleb (HNass[place]    a.  s/p pleurectomy/pleurocentesis 2011  . Hyperlipidemia   . Hypertension   . Hypothyroidism   . Idiopathic pulmonary fibrosis (HSpill[place]   . On home oxygen therapy    "3L; 24/7" (04/15/2015)  . Pneumothorax on right 4/11  . Postinflammatory pulmonary fibrosis (HMidv[place]   . Sleep apnea    "won't use CPAP" (04/14/2105)  . SVT (supraventricular tachycardia) (HAng[place]    Per Dr. TTanna Furrynote  . Type II diabetes mellitus (HBall[place]   . WPW (Wolff-Parkinson-White syndrome)    Per Dr. TTanna FurryNote   Past Surgical History:  Procedure Laterality Date  . BILATERAL VATS ABLATION Right   .  CARDIAC CATHETERIZATION  05/03/2007   patent stents  . CARDIAC CATHETERIZATION N/A 05/18/2015   Procedure: Right Heart Cath;  Surgeon: Belva Crome, MD;  Location: [facility];  Service: Cardiovascular;  Laterality: N/A;  . CORONARY ANGIOPLASTY WITH STENT PLACEMENT  03/05/2003; 2008; 01/13/2010   PCI & Stent  LAD & mid CX; stent to CX  (I've got 4 stents total" (04/15/2015)  . ELECTROPHYSIOLOGIC STUDY N/A 04/14/2015   Procedure: SVT Ablation;  Surgeon: Evans Lance, MD;  Location: [facility];  Service: Cardiovascular;  Laterality: N/A;  . KNEE  ARTHROSCOPY Right 01/22/2013   Procedure: RIGHT ARTHROSCOPY KNEE WITH DEBRIDEMENT, abrasion chondroplasty of lateral tibial plateau, debridement of partial tear of ACL, menisectomy;  Surgeon: Tobi Bastos, MD;  Location: [facility];  Service: Orthopedics;  Laterality: Right;  . KNEE SURGERY  2012   "reattached quad"  . LUNG LOBECTOMY Right   . SHOULDER ARTHROSCOPY W/ ROTATOR CUFF REPAIR Bilateral   . VIDEO ASSISTED THORACOSCOPY Left 07/19/2015   Procedure: LEFT VIDEO ASSISTED THORACOSCOPY WITH LEFT UPPER AND LOWER LUNG BIOPSY;  Surgeon: Melrose Nakayama, MD;  Location: [facility];  Service: Thoracic;  Laterality: Left;  Marland Kitchen VIDEO BRONCHOSCOPY N/A 07/19/2015   Procedure: VIDEO BRONCHOSCOPY WITH FLUORO;  Surgeon: Melrose Nakayama, MD;  Location: [facility] OR;  Service: Thoracic;  Laterality: N/A;    Current Outpatient Medications  Medication Sig Dispense Refill  . ALPRAZolam (XANAX) 0.5 MG tablet Take 1 tablet (0.5 mg total) by mouth at bedtime as needed for anxiety. 30 tablet 0  . apixaban (ELIQUIS) 5 MG TABS tablet Take 1 tablet (5 mg total) by mouth 2 (two) times daily. 60 tablet 3  . aspirin 81 MG tablet Take 81 mg by mouth every morning.     Marland Kitchen atorvastatin (LIPITOR) 40 MG tablet Take 1 tablet by mouth every night at bedtime 90 tablet 1  . Blood Glucose Monitoring Suppl (ONE TOUCH ULTRA 2) w/Device KIT Use to monitor FSBS 4x daily for fluctuating blood sugars. Dx: E11.65. (Patient taking differently: 1 each See admin instructions. Use to monitor FSBS 4x daily for fluctuating blood sugars. Dx: E11.65) 1 each 0  . Cholecalciferol (VITAMIN D-3 PO) Take 1 capsule by mouth daily.    . Cyanocobalamin (VITAMIN B-12 PO) Take 1 tablet by mouth daily.    . dapagliflozin propanediol (FARXIGA) 10 MG TABS tablet Take 10 mg by mouth daily. 30 tablet 0  . digoxin (LANOXIN) 0.125 MG tablet Take 1 tablet (0.125 mg total) by mouth daily. 30 tablet 6  . diltiazem (CARDIZEM CD) 300 MG 24 hr capsule Take 1 capsule (300 mg  total) by mouth daily. 30 capsule 6  . furosemide (LASIX) 20 MG tablet Take 1 tablet (20 mg total) by mouth daily as needed for fluid (fluid). (Patient taking differently: Take 20 mg by mouth daily. ) 90 tablet 2  . glucose blood (ONE TOUCH ULTRA TEST) test strip Use to monitor FSBS 4x daily for fluctuating blood sugars. Dx: E11.65. 400 each 2  . insulin aspart (NOVOLOG) 100 UNIT/ML injection 15 UNITS WITH BREAKFAST AND 15 UNITS WITH LUNCH, 20 UNITS WITH SUPPER (Patient taking differently: Inject 15-30 Units into the skin See admin instructions. Inject 15 units into the skin after breakfast, 15 units after lunch, and 30 units after supper) 60 mL 5  . Investigational - Study Medication "Title: FGCL-3019-091 (FibroGen Study) is a Phase 3, randomized, double-blind, placebo-controlled multicenter international study to evaluate evaluate the efficacy and  safety of 30 mg/kg IV infusions of pamrevlumab administered every 3 weeks for 52 weeks as compared to placebo in subjects with Idiopathic Pulmonary Fibrosis. Primary end point is: change in FVC from baseline at week 52"    . LANTUS 100 UNIT/ML injection INJECT 0.6 MLS (60 UNITS TOTAL) INTO THE SKIN AT BEDTIME. (Patient taking differently: Inject 60 Units into the skin at bedtime. ) 60 mL 3  . levothyroxine (SYNTHROID, LEVOTHROID) 125 MCG tablet Take 1 tablet (125 mcg total) by mouth daily. 90 tablet 2  . MAGNESIUM PO Take 1 tablet by mouth daily.    . Menthol, Topical Analgesic, (BIOFREEZE EX) Apply 1 application topically as needed (for pain).    . metFORMIN (GLUCOPHAGE) 850 MG tablet Take 1 tablet by mouth twice a day (Patient taking differently: Take 850 mg by mouth 2 (two) times daily with a meal. ) 180 tablet 2  . metoprolol tartrate (LOPRESSOR) 100 MG tablet Take 1 tablet (100 mg total) by mouth 2 (two) times daily. 180 tablet 3  . omega-3 acid ethyl esters (LOVAZA) 1 g capsule Take 2 capsules (2 g total) by mouth 2 (two) times daily. 360 capsule 3  .  ONETOUCH DELICA LANCETS FINE MISC Test up to 4 times a day as directed. (Patient taking differently: 1 each See admin instructions. Test up to 4 times a day as directed) 100 each 5  . OXYGEN Inhale 6-8 L into the lungs continuous.    Marland Kitchen POTASSIUM PO Take 1 tablet by mouth daily.    . SYRINGE/NEEDLE, DISP, 1 ML (B-D SYRINGE/NEEDLE 1CC/25GX5/8) 25G X 5/8" 1 ML MISC As directed 100 each 5  . zolpidem (AMBIEN) 10 MG tablet Take 1 tablet by mouth at bedtime. 90 tablet 0  . nitroGLYCERIN (NITROSTAT) 0.4 MG SL tablet Place 1 tablet (0.4 mg total) under the tongue every 5 (five) minutes as needed for chest pain. (Patient not taking: Reported on 10/08/2018) 25 tablet 1   No current facility-administered medications for this encounter.     Allergies  Allergen Reactions  . Niaspan [Niacin] Itching  . Ofev [Nintedanib] Other (See Comments)    "Lethargic and wiped out"  . Pirfenidone Other (See Comments)    Caused the patient to unexpectedly wet the bed in the middle of the night (was never a problem prior to taking this med)  . Ramipril Cough         Social History   Socioeconomic History  . Marital status: Single    Spouse name: Not on file  . Number of children: 1  . Years of education: Not on file  . Highest education level: Not on file  Occupational History    Employer: [employer]  . Financial resource strain: Not hard at all  . Food insecurity:    Worry: Never true    Inability: Never true  . Transportation needs:    Medical: No    Non-medical: No  Tobacco Use  . Smoking status: Former Smoker    Packs/day: 1.50    Years: 35.00    Pack years: 52.50    Types: Cigarettes    Last attempt to quit: 10/24/2007    Years since quitting: 10.9  . Smokeless tobacco: Never Used  Substance and Sexual Activity  . Alcohol use: Yes    Alcohol/week: 0.0 standard drinks    Comment: 04/15/2015 "I've had a couple drinks in the last 8 months"  . Drug use: No  . Sexual  activity:  Not Currently  Lifestyle  . Physical activity:    Days per week: 0 days    Minutes per session: 0 min  . Stress: Not at all  Relationships  . Social connections:    Talks on phone: Once a week    Gets together: More than three times a week    Attends religious service: Never    Active member of club or organization: No    Attends meetings of clubs or organizations: Never    Relationship status: Not on file  . Intimate partner violence:    Fear of current or ex partner: Not on file    Emotionally abused: Not on file    Physically abused: Not on file    Forced sexual activity: Not on file  Other Topics Concern  . Not on file  Social History Narrative  . Not on file    Family History  Problem Relation Age of Onset  . Breast cancer Mother   . Diabetes type II Mother   . Lung cancer Father     ROS- All systems are reviewed and negative except as per the HPI above  Physical Exam: Vitals:   10/08/18 0930  BP: 122/64  Pulse: 68  Weight: 113.9 kg  Height: 6' 1"  (1.854 m)   Wt Readings from Last 3 Encounters:  10/08/18 113.9 kg  10/02/18 110.6 kg  10/01/18 112 kg    Labs: Lab Results  Component Value Date   NA 140 10/02/2018   K 3.3 (L) 10/02/2018   CL 105 10/02/2018   CO2 25 10/02/2018   GLUCOSE 84 10/02/2018   BUN 15 10/02/2018   CREATININE 1.17 10/02/2018   CALCIUM 8.8 (L) 10/02/2018   MG 1.5 02/11/2015   Lab Results  Component Value Date   INR 1.02 07/15/2015   Lab Results  Component Value Date   CHOL 107 07/05/2018   HDL 30 (L) 07/05/2018   LDLCALC 53 07/05/2018   TRIG 162 (H) 07/05/2018     GEN- The patient is well appearing, alert and oriented x 3 today.   Head- normocephalic, atraumatic Eyes-  Sclera clear, conjunctiva pink Ears- hearing intact Oropharynx- clear Neck- supple, no JVP Lymph- no cervical lymphadenopathy Lungs- Clear to ausculation bilaterally, normal work of breathing Heart- Regular rate and rhythm, no murmurs,  rubs or gallops, PMI not laterally displaced GI- soft, NT, ND, + BS Extremities- no clubbing, cyanosis, or edema MS- no significant deformity or atrophy Skin- no rash or lesion Psych- euthymic mood, full affect Neuro- strength and sensation are intact  EKG-SR with first degree AV block, at 68  bpm, pr int 214 ms, qrs int 76 ms, qtc 478 ms Echo-Study Conclusions  - Left ventricle: The cavity size was normal. There was mild   concentric hypertrophy. Systolic function was mildly reduced. The   estimated ejection fraction was in the range of 45% to 50%.   Although no diagnostic regional wall motion abnormality was   identified, this possibility cannot be completely excluded on the   basis of this study. Features are consistent with a pseudonormal   left ventricular filling pattern, with concomitant abnormal   relaxation and increased filling pressure (grade 2 diastolic   dysfunction). - Mitral valve: Calcified annulus. Systolic bowing without   prolapse. - Left atrium: The atrium was mildly dilated. - Right ventricle: The cavity size was mildly dilated. Systolic   function was mildly reduced.   Assessment and Plan: 1.  Paroxysmal afib/flutter Pt is in  SR today He will continue his usual  rate control Per consult notes he is not a good candidate for any AAD therapy He has an appointment to discuss with Dr. Lovena Le 1/2 to discuss if PPM with AV nodal ablation would be indicated Advised untreated sleep apnea may be contributing to  afib burden  His lung disease is contributing to afib burden, pt thinks ILD  experimental treatment is contributing to afib as well  F/u with Dr. Lovena Le 1/2  Butch Penny C. Carroll, [facility] [address] [place], [place] [address] [phone]

## 2018-10-09 ENCOUNTER — Ambulatory Visit: Payer: Self-pay

## 2018-10-09 ENCOUNTER — Encounter (HOSPITAL_COMMUNITY): Payer: PPO

## 2018-10-09 DIAGNOSIS — R269 Unspecified abnormalities of gait and mobility: Secondary | ICD-10-CM | POA: Diagnosis not present

## 2018-10-09 DIAGNOSIS — J438 Other emphysema: Secondary | ICD-10-CM | POA: Diagnosis not present

## 2018-10-09 DIAGNOSIS — J449 Chronic obstructive pulmonary disease, unspecified: Secondary | ICD-10-CM | POA: Diagnosis not present

## 2018-10-09 DIAGNOSIS — J8410 Pulmonary fibrosis, unspecified: Secondary | ICD-10-CM | POA: Diagnosis not present

## 2018-10-10 ENCOUNTER — Telehealth: Payer: Self-pay | Admitting: Internal Medicine

## 2018-10-10 ENCOUNTER — Ambulatory Visit (HOSPITAL_COMMUNITY)
Admission: RE | Admit: 2018-10-10 | Discharge: 2018-10-10 | Disposition: A | Discharge status: Home / Self Care | Payer: PPO | Source: Ambulatory Visit | Attending: Internal Medicine | Admitting: Internal Medicine

## 2018-10-10 ENCOUNTER — Other Ambulatory Visit: Payer: Self-pay

## 2018-10-10 DIAGNOSIS — Z7982 Long term (current) use of aspirin: Secondary | ICD-10-CM | POA: Insufficient documentation

## 2018-10-10 DIAGNOSIS — Z9981 Dependence on supplemental oxygen: Secondary | ICD-10-CM | POA: Diagnosis not present

## 2018-10-10 DIAGNOSIS — I2510 Atherosclerotic heart disease of native coronary artery without angina pectoris: Secondary | ICD-10-CM | POA: Insufficient documentation

## 2018-10-10 DIAGNOSIS — I4819 Other persistent atrial fibrillation: Secondary | ICD-10-CM

## 2018-10-10 DIAGNOSIS — J9611 Chronic respiratory failure with hypoxia: Secondary | ICD-10-CM | POA: Diagnosis not present

## 2018-10-10 DIAGNOSIS — N183 Chronic kidney disease, stage 3 (moderate): Secondary | ICD-10-CM | POA: Diagnosis not present

## 2018-10-10 DIAGNOSIS — G4730 Sleep apnea, unspecified: Secondary | ICD-10-CM | POA: Diagnosis not present

## 2018-10-10 DIAGNOSIS — J449 Chronic obstructive pulmonary disease, unspecified: Secondary | ICD-10-CM | POA: Diagnosis not present

## 2018-10-10 DIAGNOSIS — I480 Paroxysmal atrial fibrillation: Secondary | ICD-10-CM | POA: Diagnosis not present

## 2018-10-10 DIAGNOSIS — J84112 Idiopathic pulmonary fibrosis: Secondary | ICD-10-CM | POA: Insufficient documentation

## 2018-10-10 DIAGNOSIS — Z79899 Other long term (current) drug therapy: Secondary | ICD-10-CM | POA: Diagnosis not present

## 2018-10-10 DIAGNOSIS — I456 Pre-excitation syndrome: Secondary | ICD-10-CM | POA: Diagnosis not present

## 2018-10-10 DIAGNOSIS — I5032 Chronic diastolic (congestive) heart failure: Secondary | ICD-10-CM | POA: Diagnosis not present

## 2018-10-10 DIAGNOSIS — I484 Atypical atrial flutter: Secondary | ICD-10-CM | POA: Diagnosis not present

## 2018-10-10 DIAGNOSIS — Z955 Presence of coronary angioplasty implant and graft: Secondary | ICD-10-CM | POA: Insufficient documentation

## 2018-10-10 DIAGNOSIS — I130 Hypertensive heart and chronic kidney disease with heart failure and stage 1 through stage 4 chronic kidney disease, or unspecified chronic kidney disease: Secondary | ICD-10-CM | POA: Insufficient documentation

## 2018-10-10 DIAGNOSIS — Z7901 Long term (current) use of anticoagulants: Secondary | ICD-10-CM | POA: Diagnosis not present

## 2018-10-10 DIAGNOSIS — Z888 Allergy status to other drugs, medicaments and biological substances status: Secondary | ICD-10-CM | POA: Diagnosis not present

## 2018-10-10 DIAGNOSIS — Z794 Long term (current) use of insulin: Secondary | ICD-10-CM | POA: Insufficient documentation

## 2018-10-10 DIAGNOSIS — E039 Hypothyroidism, unspecified: Secondary | ICD-10-CM | POA: Insufficient documentation

## 2018-10-10 DIAGNOSIS — Z833 Family history of diabetes mellitus: Secondary | ICD-10-CM | POA: Insufficient documentation

## 2018-10-10 DIAGNOSIS — I471 Supraventricular tachycardia: Secondary | ICD-10-CM | POA: Insufficient documentation

## 2018-10-10 DIAGNOSIS — Z87891 Personal history of nicotine dependence: Secondary | ICD-10-CM | POA: Diagnosis not present

## 2018-10-10 DIAGNOSIS — Z79890 Hormone replacement therapy (postmenopausal): Secondary | ICD-10-CM | POA: Diagnosis not present

## 2018-10-10 DIAGNOSIS — Z801 Family history of malignant neoplasm of trachea, bronchus and lung: Secondary | ICD-10-CM | POA: Insufficient documentation

## 2018-10-10 DIAGNOSIS — Z006 Encounter for examination for normal comparison and control in clinical research program: Secondary | ICD-10-CM

## 2018-10-10 DIAGNOSIS — E1122 Type 2 diabetes mellitus with diabetic chronic kidney disease: Secondary | ICD-10-CM | POA: Insufficient documentation

## 2018-10-10 DIAGNOSIS — Z803 Family history of malignant neoplasm of breast: Secondary | ICD-10-CM | POA: Insufficient documentation

## 2018-10-10 DIAGNOSIS — E785 Hyperlipidemia, unspecified: Secondary | ICD-10-CM | POA: Insufficient documentation

## 2018-10-10 LAB — CBC
HCT: 47.2 % (ref 39.0–52.0)
HEMOGLOBIN: 15.1 g/dL (ref 13.0–17.0)
MCH: 28 pg (ref 26.0–34.0)
MCHC: 32 g/dL (ref 30.0–36.0)
MCV: 87.4 fL (ref 80.0–100.0)
Platelets: 216 10*3/uL (ref 150–400)
RBC: 5.4 MIL/uL (ref 4.22–5.81)
RDW: 13.8 % (ref 11.5–15.5)
WBC: 9.2 10*3/uL (ref 4.0–10.5)
nRBC: 0 % (ref 0.0–0.2)

## 2018-10-10 LAB — BASIC METABOLIC PANEL
ANION GAP: 12 (ref 5–15)
BUN: 7 mg/dL — ABNORMAL LOW (ref 8–23)
CO2: 23 mmol/L (ref 22–32)
Calcium: 8.8 mg/dL — ABNORMAL LOW (ref 8.9–10.3)
Chloride: 106 mmol/L (ref 98–111)
Creatinine, Ser: 1.08 mg/dL (ref 0.61–1.24)
GFR calc Af Amer: >60 mL/min (ref >60)
GFR calc non Af Amer: >60 mL/min (ref >60)
Glucose, Bld: 128 mg/dL — ABNORMAL HIGH (ref 70–99)
Potassium: 3.9 mmol/L (ref 3.5–5.1)
Sodium: 141 mmol/L (ref 135–145)

## 2018-10-10 LAB — BRAIN NATRIURETIC PEPTIDE: B NATRIURETIC PEPTIDE 5: 282.9 pg/mL — AB (ref 0.0–100.0)

## 2018-10-10 LAB — DIGOXIN LEVEL: DIGOXIN LVL: 0.7 ng/mL — AB (ref 0.8–2.0)

## 2018-10-10 LAB — PHOSPHORUS: Phosphorus: 2.5 mg/dL (ref 2.5–4.6)

## 2018-10-10 LAB — MAGNESIUM: Magnesium: 1.3 mg/dL — ABNORMAL LOW (ref 1.7–2.4)

## 2018-10-10 LAB — TROPONIN I

## 2018-10-10 MED ORDER — STUDY - FIBROGEN - PAMREVLUMAB OR PLACEBO 10 MG/ML IV INFUSION (PI-RAMASWAMY)
30.0000 mg/kg | Freq: Once | INTRAVENOUS | Status: AC
  Administered 2018-10-10: 3350 mg via INTRAVENOUS
  Filled 2018-10-10: qty 335

## 2018-10-10 MED ORDER — MAGNESIUM SULFATE 2 GM/50ML IV SOLN
2.0000 g | Freq: Once | INTRAVENOUS | Status: DC
  Filled 2018-10-10: qty 50

## 2018-10-10 NOTE — Research (Signed)
Title: FGCL-3019-091 (FibroGen Study) is a Phase 3, randomized, double-blind, placebo-controlled multicenter international study to evaluate evaluate the efficacy and safety of 30 mg/kg IV infusions of pamrevlumab administered every 3 weeks for 52 weeks as compared to placebo in subjects with Idiopathic Pulmonary Fibrosis. Primary end point is: change in FVC from baseline at week 52  Protocol #: FGCL-3019-091, Clinical Trials #: [id number] Sponsor: [email or web address]  [facility]([place]  Protocol Version for 10/10/2018 date of  Amendment 2.0 25SEP2019 Consent Version for 10/10/2018 date of [facility] IRB Approved 29OCT2019 Revised 03DEC2019 Investigator Brochure Version for 10/10/2018 date of v17 25OCT2018  Key Features of Pamrevlumab (FG-3019) the study drug: a recombinant fully human IgG kappa monoclonal antibody that binds to CTGF and is being developed for treatment of diseases in which tissue fibrosis has a major pathogenic role. In particular, pamrevlumab appears to disrupt a CTGF autocrine loop in mesenchymal cells like myofibroblasts that reduces their recruitment of leukocytes like macrophages, mast and dendritic cells via chemokine secretion. This disruption results in collapse of the cellular crosstalk that drives tissue remodeling.        Clinical Research Coordinator note : This visit for [name] with DOB: 02/18/1956 on 10/10/2018 for the above protocol is Visit/Encounter # week 18  and is for purpose of Research . The consent for this encounter is under Protocol Version Amendment 2.0 and  Is currently IRB approved. The subject expressed continued interest and consent in continuing as a study subject. Subject confirmed that there was  No  change in contact information (e.g. address, telephone, email). Subject thanked for participation in research and contribution to science.   In this visit 10/10/2018 the subject will be evaluated by investigator named Dr. [name],  who happened to be conducting standard of care rounds on site at the hospital. A physical exam is not required per protocol on today's visit however the patient presented in what appeared to be Atrial Flutter (Patient reporting via Kardia app and watch monitor.) which lead to Dr. [name] visiting with the patient and remaining immediately available for the duration of the visit. The patient presented visually appearing more fatigued than he has in previous visits. Patient states he has felt more Short of breath. He also reports Bilateral foot chills (stated onset has been for past 4 months. But has only made mention of this today to the study coordinator.) Given the current state of the subject Dr. [name] contacted the subjects cardiologist for consult. Dr. [name]'s NP Joice LoftsAmber visited with the patient and after assesment felt that from a cardiology standpoint ok with continuing with infusion. Dr. [name] after review of all information obtained agreed with the opinion of cardiology and we proceeded with treatment for today. IP was prepared By Danelle EarthlyMichelle Porter. RN on duty was Rosezella FloridaLisa M. And [name] IV was placed at 10:41 and was removed at 15:29. Infusion was adminsistered over 1 hour along with 1 hour post infusion observation. IV remained placed throughout the duration of the observation period.  The Study coordinator Cherly HensenCameron Apollonia [name] felt the subject displayed signs of fatigue and felt that the subject would be better suited to contact his daughter for transportation home. The Coordinator stated to the subject that he should contact his daughter and the subject refused stating it was not needed and that he felt fine and he had other stops to make after his infusion. He stated he would not chance driving if he felt he was not up for it. The coordinator asked  again just to make sure the patient would not reconsider and he again stated he was fine to transport himself back home. During the infusion the study  coordinator at the request of the PI took Recurrent sets of vitals just to continue to monitor his heart function. Results at follows.  11:55 BP 104/81 (91) Pulse 127  SpO2 92 12:10 BP 117/81 (92) Pulse 126  SpO2 93 12:29 BP 106/76 (87) Pulse 126  SpO2 93  Post infusion notes: Lab orders were placed by Dr. [name] in hopes to find cause for patients fatigue levels. Subsequent lab results revealed very low magnesium levels. This lead to Dr. [name] ordering 2g of IV Magnesium for the subjects. Because of these findings the subjects' observation period was extended. The study coordinator remained with the subject and planned to remain with the subject through the infusion of magnesium. Sharolene, RN placed a call to the pharmacy to check the status of the Magnesium prep time and she informed the subject and myself that she was informed by the pharmacy that a "carousel" was down and they were unable to prepare the drug until it was fixed and where unable to give a time that drug would be prepped. At this point it had been approximately 80mins the subject has been waiting since the order was placed and he no longer wanted to wait for the drug. He mentioned to the nurse that he could have been home and taken his magnesium pills. He instructed the nurse remove his IV and let him leave. As stated previously the nurse removed the IV at 15:29 and the subject left the site at 15:35.  For further Details regarding today's Visit please refer to the subject Paper Source Binder  Signed by  T. [name] [name], CCRC, CPhT  Clinical Research Coordinator I [facility]  [place], [place] 8:15 [place] 10/11/2018

## 2018-10-10 NOTE — Progress Notes (Signed)
Second call, spoke with Keri at [phone] main pharmacy in regards to IV Mag.  See reports a carousel is down that produces magnesium and no time frame can be given as to when the mag will be available.  Will continue to monitor.

## 2018-10-10 NOTE — Telephone Encounter (Signed)
He should take his home magnesium 500mg  twice daily (total 4 tabs) for 1 week and then 250mg  daily (he was taking his daily dose only 3x/week)

## 2018-10-10 NOTE — Telephone Encounter (Signed)
Triage/Jerry Montgomery  Jerry Montgomery wasin for research infusionl during this time he had A Flutter and fatigue. LAbs showed low mag. He could not stay for mag infusion. He has promised to take oral mag. Apparently he has some at home but please call him and ensure  - he is taking mag oxide 400mg  twice daily for 1 week and then twice daily after that - if needed send script   Note: this is not a research request    LABS    PULMONARY No results for input(s): PHART, PCO2ART, PO2ART, HCO3, TCO2, O2SAT in the last 168 hours.  Invalid input(s): PCO2, PO2  CBC Recent Labs  Lab 10/10/18 1209  HGB 15.1  HCT 47.2  WBC 9.2  PLT 216    COAGULATION No results for input(s): INR in the last 168 hours.  CARDIAC   Recent Labs  Lab 10/10/18 1209  TROPONINI <0.03   No results for input(s): PROBNP in the last 168 hours.   CHEMISTRY Recent Labs  Lab 10/10/18 1209  NA 141  K 3.9  CL 106  CO2 23  GLUCOSE 128*  BUN 7*  CREATININE 1.08  CALCIUM 8.8*  MG 1.3*  PHOS 2.5   Estimated Creatinine Clearance: 90.6 mL/min (by C-G formula based on SCr of 1.08 mg/dL).   LIVER No results for input(s): AST, ALT, ALKPHOS, BILITOT, PROT, ALBUMIN, INR in the last 168 hours.   INFECTIOUS No results for input(s): LATICACIDVEN, PROCALCITON in the last 168 hours.   ENDOCRINE CBG (last 3)  No results for input(s): GLUCAP in the last 72 hours.       IMAGING x48h  - image(s) personally visualized  -   highlighted in bold No results found.

## 2018-10-10 NOTE — Telephone Encounter (Signed)
Mrs. Jerry Montgomery is aware of how Mr Jerry Montgomery is to take his magnesium. Nothing more needed at this time.

## 2018-10-10 NOTE — Telephone Encounter (Signed)
   Called spoke with patient's spouse Darl PikesSusan (dpr on file)  Per Darl PikesSusan, patient does have oral magnesium at home and has taken #1 tablet since he returned home from the research visit today  Patient does not need oral magnesium rx  Discussed oral magnesium directions as stated by MR below  Dr [name], patient's magnesium is 250mg  >> would you still like patient to take 2 tabs twice daily for 1 week?  Thank you.

## 2018-10-10 NOTE — Consult Note (Addendum)
Electrophysiology Consultation:   Patient ID: Jerry Montgomery MRN: [id number]; DOB: September 03, 1956 Date of Consult: 10/10/2018  Primary Care Provider: [name], MD Primary Cardiologist: Dr. Claiborne Billings Primary Electrophysiologist:  Dr. Lovena Le (last 2016)   Patient Profile:   Jerry Montgomery is a 62 y.o. male with a hx of CAD (PCI to LAD in 2004, Cx in 2011), HTN, HLD, DM, chronic CHF (diastolic), COPD, idiopathic Pulmonary fibrosis w/chronic hypoxic respiratory failure, (on home O2, follows with Dr.Ramaswamy), WPW/SVT (ablated 2016), and AFib/flutter who is being seen today for the evaluation of AFib w/RVR, difficult to control at the request of Dr. Chase Caller  History of Present Illness:   Mr. Dupuy was first found with AF initially in 2016, though quite until 07/11/18 via his [facility] app noted frequent AF with RVR and the patient noted fatigue and SOB. In the ED he was noted to be in NSR. He had CHF on his CXR. It was suggested he increase his Lasix to 20 mg daily and his Toprol to 100 mg daily with an extra 50 mg Q pm for sustained HR > 130.  He was seen in the office 07/23/18 for follow up and was doing OK. He was in NSR.  He still noticed his HR > 130 but only for short periods, he was otherwise unaware of any tachycardia.  He was readmitted 08/07/18 with PAF with RVR when it was noted his HR was 130 while he was getting his pulmonary fibrosis experimental drug infusion as an OP (reportedly without symptoms), though reported that he was in AF prior to the infusion.  He was seen by EP during that stay, not felt an AAD candidate (given comorbid conditions/limits and baseline QT >452m) and recommended rate control strategy. He was discharged 08/09/18 (he had become frustrated with [place] he felt like nothing was being done). His metoprolol was increased to 100 mg BID (it had been decreased in the past secondary to bradycardia and fatigue with a HR in the 50s). He called Dr PDennard Schaumannon 10/22 to relate  his [facility] was [facility] and Dr PDennard Schaumannadded Diltiazem 180 mg.  He has had persistent tachycardia readings on his [facility] app at home despite compliance with Diltiazem 308mdaily and Metoprolol 10047mwice daily. He has felt poorly since AF began in August of this year despite heart rate readings. He does have periods where his heart rate is in the 60's and he has persistent fatigue and shortness of breath with exertion.   Record indicates pulmonary-wise, he failed nintedanib and pirfenidone because of side effects. He has been on an experimental infusion but otherwise supportive care measures. He has not been interested in pulmonary transplant per notes.   He denies palpitations, recent fevers or chills, chest pain.  Past Medical History:  Diagnosis Date  . Acute diastolic heart failure ([facility] . Atrial fibrillation (HCC)    vs flutter  . Atrial flutter ([facility]CC[facility] . Atrial tachycardia ([facility]CC[place] . CAD (coronary artery disease)    a. PCI of prox LAD and Cx in 2004. b. PCI to Cx 2008. c. PCI to ISR of Cx in 2011.  . Cardiomyopathy ([facility]  a. EF 45-50% in 06/2018.  . CMarland Kitchenronic respiratory failure with hypoxia, on home O2 therapy ([facility]CC[facility] . CKD (chronic kidney disease), stage III (HCC[place] . COPD (chronic obstructive pulmonary disease) ([facility]CC[facility] . Emphysematous bleb (HCCGunbarrel  a.  s/p pleurectomy/pleurocentesis 2011  . Hyperlipidemia   . Hypertension   . Hypothyroidism   . Idiopathic pulmonary fibrosis ([place])   . On home oxygen therapy    "3L; 24/7" (04/15/2015)  . Pneumothorax on right 4/11  . Postinflammatory pulmonary fibrosis ([place])   . Sleep apnea    "won't use CPAP" (04/14/2105)  . SVT (supraventricular tachycardia) ([place])    Per Dr. Tanna Furry note  . Type II diabetes mellitus ([place])   . WPW (Wolff-Parkinson-White syndrome)    Per Dr. Tanna Furry Note    Past Surgical History:  Procedure Laterality Date  . BILATERAL VATS ABLATION Right   . CARDIAC CATHETERIZATION  05/03/2007     patent stents  . CARDIAC CATHETERIZATION N/A 05/18/2015   Procedure: Right Heart Cath;  Surgeon: Belva Crome, MD;  Location: [facility];  Service: Cardiovascular;  Laterality: N/A;  . CORONARY ANGIOPLASTY WITH STENT PLACEMENT  03/05/2003; 2008; 01/13/2010   PCI & Stent  LAD & mid CX; stent to CX  (I've got 4 stents total" (04/15/2015)  . ELECTROPHYSIOLOGIC STUDY N/A 04/14/2015   Procedure: SVT Ablation;  Surgeon: Evans Lance, MD;  Location: [facility];  Service: Cardiovascular;  Laterality: N/A;  . KNEE ARTHROSCOPY Right 01/22/2013   Procedure: RIGHT ARTHROSCOPY KNEE WITH DEBRIDEMENT, abrasion chondroplasty of lateral tibial plateau, debridement of partial tear of ACL, menisectomy;  Surgeon: Tobi Bastos, MD;  Location: [facility];  Service: Orthopedics;  Laterality: Right;  . KNEE SURGERY  2012   "reattached quad"  . LUNG LOBECTOMY Right   . SHOULDER ARTHROSCOPY W/ ROTATOR CUFF REPAIR Bilateral   . VIDEO ASSISTED THORACOSCOPY Left 07/19/2015   Procedure: LEFT VIDEO ASSISTED THORACOSCOPY WITH LEFT UPPER AND LOWER LUNG BIOPSY;  Surgeon: Melrose Nakayama, MD;  Location: [facility];  Service: Thoracic;  Laterality: Left;  Marland [place] VIDEO BRONCHOSCOPY N/A 07/19/2015   Procedure: VIDEO BRONCHOSCOPY WITH FLUORO;  Surgeon: Melrose Nakayama, MD;  [facility];  Service: Thoracic;  Laterality: N/A;     Home Medications:   Current Outpatient Medications:  .  ALPRAZolam (XANAX) 0.5 MG tablet, Take 1 tablet (0.5 mg total) by mouth at bedtime as needed for anxiety., Disp: 30 tablet, Rfl: 0 .  apixaban (ELIQUIS) 5 MG TABS tablet, Take 1 tablet (5 mg total) by mouth 2 (two) times daily., Disp: 60 tablet, Rfl: 3 .  aspirin 81 MG tablet, Take 81 mg by mouth every morning. , Disp: , Rfl:  .  atorvastatin (LIPITOR) 40 MG tablet, Take 1 tablet by mouth every night at bedtime, Disp: 90 tablet, Rfl: 1 .  Blood Glucose Monitoring Suppl (ONE TOUCH ULTRA 2) w/Device KIT, Use to monitor FSBS 4x daily  for fluctuating blood sugars. Dx: E11.65. (Patient taking differently: 1 each See admin instructions. Use to monitor FSBS 4x daily for fluctuating blood sugars. Dx: E11.65), Disp: 1 each, Rfl: 0 .  Cholecalciferol (VITAMIN D-3 PO), Take 1 capsule by mouth daily., Disp: , Rfl:  .  Cyanocobalamin (VITAMIN B-12 PO), Take 1 tablet by mouth daily., Disp: , Rfl:  .  dapagliflozin propanediol (FARXIGA) 10 MG TABS tablet, Take 10 mg by mouth daily., Disp: 30 tablet, Rfl: 0 .  digoxin (LANOXIN) 0.125 MG tablet, Take 1 tablet (0.125 mg total) by mouth daily., Disp: 30 tablet, Rfl: 6 .  diltiazem (CARDIZEM CD) 300 MG 24 hr capsule, Take 1 capsule (300 mg total) by mouth daily., Disp: 30 capsule, Rfl: 6 .  furosemide (LASIX) 20 MG  tablet, Take 1 tablet (20 mg total) by mouth daily as needed for fluid (fluid). (Patient taking differently: Take 20 mg by mouth daily. ), Disp: 90 tablet, Rfl: 2 .  glucose blood (ONE TOUCH ULTRA TEST) test strip, Use to monitor FSBS 4x daily for fluctuating blood sugars. Dx: E11.65., Disp: 400 each, Rfl: 2 .  insulin aspart (NOVOLOG) 100 UNIT/ML injection, 15 UNITS WITH BREAKFAST AND 15 UNITS WITH LUNCH, 20 UNITS WITH SUPPER (Patient taking differently: Inject 15-30 Units into the skin See admin instructions. Inject 15 units into the skin after breakfast, 15 units after lunch, and 30 units after supper), Disp: 60 mL, Rfl: 5 .  Investigational - Study Medication, "Title: FGCL-3019-091 (FibroGen Study) is a Phase 3, randomized, double-blind, placebo-controlled multicenter international study to evaluate evaluate the efficacy and safety of 30 mg/kg IV infusions of pamrevlumab administered every 3 weeks for 52 weeks as compared to placebo in subjects with Idiopathic Pulmonary Fibrosis. Primary end point is: change in FVC from baseline at week 52", Disp: , Rfl:  .  LANTUS 100 UNIT/ML injection, INJECT 0.6 MLS (60 UNITS TOTAL) INTO THE SKIN AT BEDTIME. (Patient taking differently: Inject 60  Units into the skin at bedtime. ), Disp: 60 mL, Rfl: 3 .  levothyroxine (SYNTHROID, LEVOTHROID) 125 MCG tablet, Take 1 tablet (125 mcg total) by mouth daily., Disp: 90 tablet, Rfl: 2 .  MAGNESIUM PO, Take 1 tablet by mouth daily., Disp: , Rfl:  .  Menthol, Topical Analgesic, (BIOFREEZE EX), Apply 1 application topically as needed (for pain)., Disp: , Rfl:  .  metFORMIN (GLUCOPHAGE) 850 MG tablet, Take 1 tablet by mouth twice a day (Patient taking differently: Take 850 mg by mouth 2 (two) times daily with a meal. ), Disp: 180 tablet, Rfl: 2 .  metoprolol tartrate (LOPRESSOR) 100 MG tablet, Take 1 tablet (100 mg total) by mouth 2 (two) times daily., Disp: 180 tablet, Rfl: 3 .  nitroGLYCERIN (NITROSTAT) 0.4 MG SL tablet, Place 1 tablet (0.4 mg total) under the tongue every 5 (five) minutes as needed for chest pain. (Patient not taking: Reported on 10/08/2018), Disp: 25 tablet, Rfl: 1 .  omega-3 acid ethyl esters (LOVAZA) 1 g capsule, Take 2 capsules (2 g total) by mouth 2 (two) times daily., Disp: 360 capsule, Rfl: 3 .  ONETOUCH DELICA LANCETS FINE MISC, Test up to 4 times a day as directed. (Patient taking differently: 1 each See admin instructions. Test up to 4 times a day as directed), Disp: 100 each, Rfl: 5 .  OXYGEN, Inhale 6-8 L into the lungs continuous., Disp: , Rfl:  .  POTASSIUM PO, Take 1 tablet by mouth daily., Disp: , Rfl:  .  SYRINGE/NEEDLE, DISP, 1 ML (B-D SYRINGE/NEEDLE 1CC/25GX5/8) 25G X 5/8" 1 ML MISC, As directed, Disp: 100 each, Rfl: 5 .  zolpidem (AMBIEN) 10 MG tablet, Take 1 tablet by mouth at bedtime., Disp: 90 tablet, Rfl: 0    Allergies:    Allergies  Allergen Reactions  . Niaspan [Niacin] Itching  . Ofev [Nintedanib] Other (See Comments)    "Lethargic and wiped out"  . Pirfenidone Other (See Comments)    Caused the patient to unexpectedly wet the bed in the middle of the night (was never a problem prior to taking this med)  . Ramipril Cough         Social History:    Social History   Socioeconomic History  . Marital status: Single    Spouse name: Not on file  .  Number of children: 1  . Years of education: Not on file  . Highest education level: Not on file  Occupational History    Employer: [employer]  . Financial resource strain: Not hard at all  . Food insecurity:    Worry: Never true    Inability: Never true  . Transportation needs:    Medical: No    Non-medical: No  Tobacco Use  . Smoking status: Former Smoker    Packs/day: 1.50    Years: 35.00    Pack years: 52.50    Types: Cigarettes    Last attempt to quit: 10/24/2007    Years since quitting: 10.9  . Smokeless tobacco: Never Used  Substance and Sexual Activity  . Alcohol use: Yes    Alcohol/week: 0.0 standard drinks    Comment: 04/15/2015 "I've had a couple drinks in the last 8 months"  . Drug use: No  . Sexual activity: Not Currently  Lifestyle  . Physical activity:    Days per week: 0 days    Minutes per session: 0 min  . Stress: Not at all  Relationships  . Social connections:    Talks on phone: Once a week    Gets together: More than three times a week    Attends religious service: Never    Active member of club or organization: No    Attends meetings of clubs or organizations: Never    Relationship status: Not on file  . Intimate partner violence:    Fear of current or ex partner: Not on file    Emotionally abused: Not on file    Physically abused: Not on file    Forced sexual activity: Not on file  Other Topics Concern  . Not on file  Social History Narrative  . Not on file    Family History:    Family History  Problem Relation Age of Onset  . Breast cancer Mother   . Diabetes type II Mother   . Lung cancer Father      ROS:  Please see the history of present illness.  All other ROS reviewed and negative.     Physical Exam/Data:   Vitals:   10/10/18 0913  BP: 120/81  Pulse: (!) 137  Resp: 20  Temp: 97.8 F (36.6 C)   TempSrc: Oral  SpO2: 92%  Weight: 112 kg  Height: 6' 1"  (1.854 m)   No intake or output data in the 24 hours ending 10/10/18 1038 Filed Weights   10/10/18 0913  Weight: 112 kg   Body mass index is 32.59 kg/m.  General:  Well nourished, well developed, in no acute distress, +O2 HEENT: normal Cardiac:  irreg-irreg; no murmurs, gallops or rubs Lungs:  Very fine crackles, no wheezing, rhonchi or rales  Abd: soft, nontender  Ext: no edema Musculoskeletal:  No deformities Skin: warm and dry  Neuro:  No gross focal abnormalities noted Psych:  Normal affect   EKG:  The EKG was personally reviewed and demonstrates:   Atypical atrial flutter, rate 125  Relevant CV Studies:  07/18/18: TTE Study Conclusions - Left ventricle: The cavity size was normal. There was mild   concentric hypertrophy. Systolic function was mildly reduced. The   estimated ejection fraction was in the range of 45% to 50%.   Although no diagnostic regional wall motion abnormality was   identified, this possibility cannot be completely excluded on the   basis of this study. Features are consistent with a  pseudonormal   left ventricular filling pattern, with concomitant abnormal   relaxation and increased filling pressure (grade 2 diastolic   dysfunction). - Mitral valve: Calcified annulus. Systolic bowing without   prolapse. - Left atrium: The atrium was mildly dilated. - Right ventricle: The cavity size was mildly dilated. Systolic   function was mildly reduced.   Radiology/Studies:  Dg Chest 2 View Result Date: 10/01/2018 CLINICAL DATA:  Chest pain tachycardia EXAM: CHEST - 2 VIEW COMPARISON:  07/11/2018 FINDINGS: Prominent lung markings are present bilaterally right greater than left mostly in the bases. These have been present on prior studies including chest CT of 05/17/2018 consistent with fibrosis. Negative for superimposed acute infiltrate or effusion.  No edema. IMPRESSION: Chronic lung disease with  interstitial fibrosis similar to prior studies. No superimposed acute abnormality. Electronically Signed   By: Franchot Gallo M.D.   On: 10/01/2018 13:22    Assessment and Plan:   1. Persistent atypical atrial flutter and atrial fibrillation In the setting of pulmonary fibrosis It is really difficult to tell how symptomatic he is with his AF. He feels poorly when he is in SR in the 60's as well as AF with RVR. He does not have AAD options with pulmonary fibrosis, CAD, and CKD. QTc is >436mec at baseline.   I am concerned about possible tachycardia mediated cardiomyopathy with persistently elevated rates. He has been very difficult to rate control despite CCB, BB and Dig. When in SR, rates are 50-60's. I am worried about pushing rate control any more. Can consider PPM and AVN ablation; however, we discussed today that I am not sure this will help his symptoms.   2.  Pulmonary fibrosis Per pulmonary  Dr TLovena Leto see later today    For questions or updates, please contact [facility] Please consult [email or web address] for contact info under    Signed, AChanetta Marshall NP  10/10/2018 10:38 AM   EP attending  Patient seen and examined.  Agree with the findings as noted above.  The patient has extremely difficult to control atrial fibrillation and flutter.  Remotely he had WPW syndrome and underwent successful catheter ablation.  The patient has no evidence of recurrent accessory pathway conduction.  His ventricular rates in atrial fibrillation and flutter have been very difficult to control despite multiple AV nodal blocking drugs.  He is a poor candidate for antiarrhythmic drug therapy.  Specifically amiodarone is contraindicated with his pulmonary fibrosis.  I discussed the treatment options in detail with the patient.  The risks, goals, benefits, and expectations of AV node ablation and insertion of a dual-chamber pacemaker were reviewed in detail.  He would like to proceed with this.  This will  be scheduled the next several weeks as her schedule allows.  GCristopher Peru MD

## 2018-10-11 ENCOUNTER — Telehealth: Payer: Self-pay

## 2018-10-11 NOTE — Telephone Encounter (Signed)
Follow up: ° ° °Patient returning call back °

## 2018-10-14 NOTE — Telephone Encounter (Signed)
Spoke with Pt.  [facility] letter and surgical scrub.  No further action at this time.

## 2018-10-24 ENCOUNTER — Ambulatory Visit: Payer: PPO | Admitting: Internal Medicine

## 2018-10-24 ENCOUNTER — Other Ambulatory Visit (HOSPITAL_COMMUNITY): Payer: Self-pay | Admitting: Physician Assistant

## 2018-10-28 ENCOUNTER — Ambulatory Visit (HOSPITAL_COMMUNITY)
Admission: RE | Disposition: A | Discharge status: Home / Self Care | Payer: Self-pay | Source: Home / Self Care | Attending: Internal Medicine

## 2018-10-28 ENCOUNTER — Encounter (HOSPITAL_COMMUNITY): Payer: Self-pay

## 2018-10-28 ENCOUNTER — Other Ambulatory Visit: Payer: Self-pay

## 2018-10-28 ENCOUNTER — Ambulatory Visit (HOSPITAL_COMMUNITY)
Admission: RE | Admit: 2018-10-28 | Discharge: 2018-10-29 | Disposition: A | Discharge status: Home / Self Care | Payer: PPO | Attending: Internal Medicine | Admitting: Internal Medicine

## 2018-10-28 DIAGNOSIS — E1122 Type 2 diabetes mellitus with diabetic chronic kidney disease: Secondary | ICD-10-CM | POA: Diagnosis not present

## 2018-10-28 DIAGNOSIS — Z833 Family history of diabetes mellitus: Secondary | ICD-10-CM | POA: Insufficient documentation

## 2018-10-28 DIAGNOSIS — J8410 Pulmonary fibrosis, unspecified: Secondary | ICD-10-CM | POA: Diagnosis not present

## 2018-10-28 DIAGNOSIS — Z87891 Personal history of nicotine dependence: Secondary | ICD-10-CM | POA: Diagnosis not present

## 2018-10-28 DIAGNOSIS — I480 Paroxysmal atrial fibrillation: Secondary | ICD-10-CM | POA: Diagnosis present

## 2018-10-28 DIAGNOSIS — E785 Hyperlipidemia, unspecified: Secondary | ICD-10-CM | POA: Insufficient documentation

## 2018-10-28 DIAGNOSIS — G4730 Sleep apnea, unspecified: Secondary | ICD-10-CM | POA: Insufficient documentation

## 2018-10-28 DIAGNOSIS — I5032 Chronic diastolic (congestive) heart failure: Secondary | ICD-10-CM | POA: Diagnosis not present

## 2018-10-28 DIAGNOSIS — I2510 Atherosclerotic heart disease of native coronary artery without angina pectoris: Secondary | ICD-10-CM | POA: Insufficient documentation

## 2018-10-28 DIAGNOSIS — I130 Hypertensive heart and chronic kidney disease with heart failure and stage 1 through stage 4 chronic kidney disease, or unspecified chronic kidney disease: Secondary | ICD-10-CM | POA: Diagnosis not present

## 2018-10-28 DIAGNOSIS — Z9981 Dependence on supplemental oxygen: Secondary | ICD-10-CM | POA: Diagnosis not present

## 2018-10-28 DIAGNOSIS — I442 Atrioventricular block, complete: Secondary | ICD-10-CM | POA: Insufficient documentation

## 2018-10-28 DIAGNOSIS — N183 Chronic kidney disease, stage 3 (moderate): Secondary | ICD-10-CM | POA: Diagnosis not present

## 2018-10-28 DIAGNOSIS — I484 Atypical atrial flutter: Secondary | ICD-10-CM | POA: Diagnosis not present

## 2018-10-28 DIAGNOSIS — Z955 Presence of coronary angioplasty implant and graft: Secondary | ICD-10-CM | POA: Insufficient documentation

## 2018-10-28 DIAGNOSIS — E039 Hypothyroidism, unspecified: Secondary | ICD-10-CM | POA: Insufficient documentation

## 2018-10-28 DIAGNOSIS — I4891 Unspecified atrial fibrillation: Secondary | ICD-10-CM | POA: Diagnosis present

## 2018-10-28 DIAGNOSIS — I456 Pre-excitation syndrome: Secondary | ICD-10-CM | POA: Insufficient documentation

## 2018-10-28 DIAGNOSIS — I4819 Other persistent atrial fibrillation: Secondary | ICD-10-CM | POA: Insufficient documentation

## 2018-10-28 DIAGNOSIS — Z950 Presence of cardiac pacemaker: Secondary | ICD-10-CM

## 2018-10-28 DIAGNOSIS — I495 Sick sinus syndrome: Secondary | ICD-10-CM | POA: Diagnosis not present

## 2018-10-28 DIAGNOSIS — Z7901 Long term (current) use of anticoagulants: Secondary | ICD-10-CM | POA: Diagnosis not present

## 2018-10-28 DIAGNOSIS — J449 Chronic obstructive pulmonary disease, unspecified: Secondary | ICD-10-CM | POA: Insufficient documentation

## 2018-10-28 DIAGNOSIS — Z7982 Long term (current) use of aspirin: Secondary | ICD-10-CM | POA: Insufficient documentation

## 2018-10-28 DIAGNOSIS — Z902 Acquired absence of lung [part of]: Secondary | ICD-10-CM | POA: Diagnosis not present

## 2018-10-28 DIAGNOSIS — Z79899 Other long term (current) drug therapy: Secondary | ICD-10-CM | POA: Diagnosis not present

## 2018-10-28 DIAGNOSIS — Z888 Allergy status to other drugs, medicaments and biological substances status: Secondary | ICD-10-CM | POA: Diagnosis not present

## 2018-10-28 DIAGNOSIS — Z794 Long term (current) use of insulin: Secondary | ICD-10-CM | POA: Diagnosis not present

## 2018-10-28 HISTORY — PX: PACEMAKER IMPLANT: EP1218

## 2018-10-28 HISTORY — PX: AV NODE ABLATION: EP1193

## 2018-10-28 LAB — GLUCOSE, CAPILLARY
Glucose-Capillary: 135 mg/dL — ABNORMAL HIGH (ref 70–99)
Glucose-Capillary: 124 mg/dL — ABNORMAL HIGH (ref 70–99)
Glucose-Capillary: 310 mg/dL — ABNORMAL HIGH (ref 70–99)
Glucose-Capillary: 304 mg/dL — ABNORMAL HIGH (ref 70–99)

## 2018-10-28 LAB — SURGICAL PCR SCREEN
MRSA, PCR: NEGATIVE
Staphylococcus aureus: NEGATIVE

## 2018-10-28 SURGERY — AV NODE ABLATION

## 2018-10-28 MED ORDER — ONDANSETRON HCL 4 MG/2ML IJ SOLN: 4.0000 mg | Freq: Four times a day (QID) | INTRAMUSCULAR | Status: DC | PRN

## 2018-10-28 MED ORDER — DILTIAZEM HCL ER COATED BEADS 180 MG PO CP24
300.0000 mg | ORAL_CAPSULE | Freq: Every day | ORAL | Status: DC
  Administered 2018-10-29: 300 mg via ORAL
  Filled 2018-10-28: qty 1

## 2018-10-28 MED ORDER — FENTANYL CITRATE (PF) 100 MCG/2ML IJ SOLN
INTRAMUSCULAR | Status: DC | PRN
  Administered 2018-10-28 (×2): 12.5 ug via INTRAVENOUS

## 2018-10-28 MED ORDER — OMEGA-3-ACID ETHYL ESTERS 1 G PO CAPS
2.0000 g | ORAL_CAPSULE | Freq: Two times a day (BID) | ORAL | Status: DC
  Administered 2018-10-28 – 2018-10-29 (×2): 2 g via ORAL
  Filled 2018-10-28 (×2): qty 2

## 2018-10-28 MED ORDER — MIDAZOLAM HCL 5 MG/5ML IJ SOLN
INTRAMUSCULAR | Status: AC
  Filled 2018-10-28: qty 5

## 2018-10-28 MED ORDER — HEPARIN (PORCINE) IN NACL 2-0.9 UNITS/ML
INTRAMUSCULAR | Status: AC | PRN
  Administered 2018-10-28: 500 mL

## 2018-10-28 MED ORDER — ACETAMINOPHEN 325 MG PO TABS
325.0000 mg | ORAL_TABLET | ORAL | Status: DC | PRN
  Administered 2018-10-29: 650 mg via ORAL
  Filled 2018-10-28: qty 2

## 2018-10-28 MED ORDER — SODIUM CHLORIDE 0.9 % IV SOLN
80.0000 mg | INTRAVENOUS | Status: AC
  Administered 2018-10-28: 80 mg

## 2018-10-28 MED ORDER — CEFAZOLIN SODIUM-DEXTROSE 1-4 GM/50ML-% IV SOLN
1.0000 g | Freq: Four times a day (QID) | INTRAVENOUS | Status: AC
  Administered 2018-10-28 – 2018-10-29 (×3): 1 g via INTRAVENOUS
  Filled 2018-10-28 (×3): qty 50

## 2018-10-28 MED ORDER — ALPRAZOLAM 0.5 MG PO TABS: 0.5000 mg | ORAL_TABLET | Freq: Every evening | ORAL | Status: DC | PRN

## 2018-10-28 MED ORDER — HEPARIN (PORCINE) IN NACL 1000-0.9 UT/500ML-% IV SOLN
INTRAVENOUS | Status: AC
  Filled 2018-10-28: qty 1000

## 2018-10-28 MED ORDER — CEFAZOLIN SODIUM-DEXTROSE 2-4 GM/100ML-% IV SOLN
INTRAVENOUS | Status: AC
  Filled 2018-10-28: qty 100

## 2018-10-28 MED ORDER — FUROSEMIDE 20 MG PO TABS
20.0000 mg | ORAL_TABLET | Freq: Every day | ORAL | Status: DC
  Administered 2018-10-28: 20 mg via ORAL
  Filled 2018-10-28: qty 1

## 2018-10-28 MED ORDER — METOPROLOL TARTRATE 50 MG PO TABS
50.0000 mg | ORAL_TABLET | Freq: Two times a day (BID) | ORAL | Status: DC
  Administered 2018-10-28 – 2018-10-29 (×2): 50 mg via ORAL
  Filled 2018-10-28 (×2): qty 1

## 2018-10-28 MED ORDER — IOPAMIDOL (ISOVUE-370) INJECTION 76%
INTRAVENOUS | Status: DC | PRN
  Administered 2018-10-28: 10 mL via INTRAVENOUS

## 2018-10-28 MED ORDER — ASPIRIN EC 81 MG PO TBEC
81.0000 mg | DELAYED_RELEASE_TABLET | Freq: Every day | ORAL | Status: DC
  Administered 2018-10-28 – 2018-10-29 (×2): 81 mg via ORAL
  Filled 2018-10-28 (×3): qty 1

## 2018-10-28 MED ORDER — INSULIN ASPART 100 UNIT/ML ~~LOC~~ SOLN
0.0000 [IU] | Freq: Every day | SUBCUTANEOUS | Status: DC
  Administered 2018-10-28: 4 [IU] via SUBCUTANEOUS

## 2018-10-28 MED ORDER — FENTANYL CITRATE (PF) 100 MCG/2ML IJ SOLN
INTRAMUSCULAR | Status: AC
  Filled 2018-10-28: qty 2

## 2018-10-28 MED ORDER — BUPIVACAINE HCL (PF) 0.25 % IJ SOLN
INTRAMUSCULAR | Status: AC
  Filled 2018-10-28: qty 30

## 2018-10-28 MED ORDER — ATORVASTATIN CALCIUM 40 MG PO TABS
40.0000 mg | ORAL_TABLET | Freq: Every day | ORAL | Status: DC
  Administered 2018-10-28: 40 mg via ORAL
  Filled 2018-10-28: qty 1

## 2018-10-28 MED ORDER — ZOLPIDEM TARTRATE 5 MG PO TABS
10.0000 mg | ORAL_TABLET | Freq: Every day | ORAL | Status: DC
  Administered 2018-10-28: 10 mg via ORAL
  Filled 2018-10-28: qty 2

## 2018-10-28 MED ORDER — NITROGLYCERIN 0.4 MG SL SUBL: 0.4000 mg | SUBLINGUAL_TABLET | SUBLINGUAL | Status: DC | PRN

## 2018-10-28 MED ORDER — INSULIN ASPART 100 UNIT/ML ~~LOC~~ SOLN: 0.0000 [IU] | Freq: Three times a day (TID) | SUBCUTANEOUS | Status: DC

## 2018-10-28 MED ORDER — SODIUM CHLORIDE 0.9 % IV SOLN
INTRAVENOUS | Status: DC
  Administered 2018-10-28: 09:00:00 via INTRAVENOUS

## 2018-10-28 MED ORDER — CHLORHEXIDINE GLUCONATE 4 % EX LIQD
60.0000 mL | Freq: Once | CUTANEOUS | Status: DC
  Filled 2018-10-28: qty 60

## 2018-10-28 MED ORDER — CEFAZOLIN SODIUM-DEXTROSE 2-4 GM/100ML-% IV SOLN
2.0000 g | INTRAVENOUS | Status: AC
  Administered 2018-10-28: 2 g via INTRAVENOUS

## 2018-10-28 MED ORDER — MUPIROCIN 2 % EX OINT
1.0000 "application " | TOPICAL_OINTMENT | Freq: Once | CUTANEOUS | Status: DC
  Filled 2018-10-28: qty 22

## 2018-10-28 MED ORDER — DIGOXIN 125 MCG PO TABS: 125.0000 ug | ORAL_TABLET | Freq: Every day | ORAL | Status: DC

## 2018-10-28 MED ORDER — MIDAZOLAM HCL 5 MG/5ML IJ SOLN
INTRAMUSCULAR | Status: DC | PRN
  Administered 2018-10-28 (×2): 1 mg via INTRAVENOUS

## 2018-10-28 MED ORDER — BUPIVACAINE HCL (PF) 0.25 % IJ SOLN
INTRAMUSCULAR | Status: DC | PRN
  Administered 2018-10-28: 10 mL

## 2018-10-28 MED ORDER — SODIUM CHLORIDE 0.9 % IV SOLN
INTRAVENOUS | Status: AC
  Filled 2018-10-28: qty 2

## 2018-10-28 MED ORDER — LIDOCAINE HCL 1 % IJ SOLN
INTRAMUSCULAR | Status: AC
  Filled 2018-10-28: qty 60

## 2018-10-28 MED ORDER — LIDOCAINE HCL (PF) 1 % IJ SOLN
INTRAMUSCULAR | Status: DC | PRN
  Administered 2018-10-28: 60 mL

## 2018-10-28 MED ORDER — MUPIROCIN 2 % EX OINT
TOPICAL_OINTMENT | CUTANEOUS | Status: AC
  Filled 2018-10-28: qty 22

## 2018-10-28 MED ORDER — LEVOTHYROXINE SODIUM 25 MCG PO TABS
125.0000 ug | ORAL_TABLET | Freq: Every day | ORAL | Status: DC
  Administered 2018-10-29: 125 ug via ORAL
  Filled 2018-10-28: qty 1

## 2018-10-28 SURGICAL SUPPLY — 17 items
BAG SNAP BAND KOVER 36X36 (MISCELLANEOUS) ×2 IMPLANT
CABLE SURGICAL S-101-97-12 (CABLE) ×3 IMPLANT
CATH CELSIUS THERMO F CV 7FR (ABLATOR) ×2 IMPLANT
CATH RIGHTSITE C315HIS02 (CATHETERS) ×2 IMPLANT
IPG PACE AZUR XT DR MRI W1DR01 (Pacemaker) IMPLANT
LEAD CAPSURE NOVUS 5076-52CM (Lead) ×2 IMPLANT
LEAD SELECT SECURE 3830 383069 (Lead) IMPLANT
PACE AZURE XT DR MRI W1DR01 (Pacemaker) ×3 IMPLANT
PACK EP LATEX FREE (CUSTOM PROCEDURE TRAY) ×4
PACK EP LF (CUSTOM PROCEDURE TRAY) ×1 IMPLANT
PAD PRO RADIOLUCENT 2001M-C (PAD) ×3 IMPLANT
SELECT SECURE 3830 383069 (Lead) ×3 IMPLANT
SHEATH CLASSIC 7F (SHEATH) ×4 IMPLANT
SHEATH PINNACLE 8F 10CM (SHEATH) ×2 IMPLANT
SLITTER 6232ADJ (MISCELLANEOUS) ×2 IMPLANT
TRAY PACEMAKER INSERTION (PACKS) ×3 IMPLANT
WIRE HI TORQ VERSACORE-J 145CM (WIRE) ×2 IMPLANT

## 2018-10-28 NOTE — Progress Notes (Signed)
Site area: RFV  Site Prior to Removal:  Level 0 Pressure Applied For: 5 min Manual:   yes Patient Status During Pull:  stable Post Pull Site:  Level 0 Post Pull Instructions Given: yes  Post Pull Pulses Present: doppler Dressing Applied:  clear Bedrest begins @ 1300 Comments: removed by Jannet Mantisebbie Young

## 2018-10-28 NOTE — H&P (Signed)
Electrophysiology Consultation:   Patient ID: Jerry Montgomery MRN: [id number]; DOB: 09/10/1956 Date of Consult: 10/10/2018  Primary Care Provider: [name], MD Primary Cardiologist: Dr. Claiborne Billings Primary Electrophysiologist:  Dr. Lovena Le (last 2016)   Patient Profile:   Jerry Montgomery is a 63 y.o. male with a hx of CAD (PCI to LAD in 2004, Cx in 2011), HTN, HLD, DM, chronic CHF (diastolic), COPD, idiopathic Pulmonary fibrosis w/chronic hypoxic respiratory failure, (on home O2, follows with Dr.Ramaswamy), WPW/SVT (ablated 2016), and AFib/flutter who is being seen today for the evaluation of AFib w/RVR, difficult to control at the request of Dr. Chase Caller  History of Present Illness:   Mr. Gaglio was first found with AF initially in 2016, though quite until 07/11/18 via his [place] app noted frequent AF with RVR and the patient noted fatigue and SOB. In the ED he was noted to be inNSR. He had CHF on his CXR. It was suggested he increase his Lasix to 20 mg daily and his Toprol to 100 mg daily with an extra 50 mg Q pm for sustained HR >130. He was seenin the office 10/01/19for follow up and was doing OK.He was in NSR.He still noticedhis HR > 130 but only for short periods, hewasotherwise unaware of any tachycardia.  He was readmitted 08/07/18 with PAF with RVR when it was noted his HR was 130 while he was getting his pulmonary fibrosis experimental drug infusion as an OP (reportedly without symptoms), though reported that he was in AF prior to the infusion.  He was seen by EP during that stay, not felt an AAD candidate (given comorbid conditions/limits and baseline QT >433m) and recommended rate control strategy. He was discharged 08/09/18 (he had become frustrated with [place] he felt like nothing was being done). His metoprolol was increased to 100 mg BID (it had been decreased in the past secondary to bradycardia and fatigue with a HR in the 50s). He called Dr PDennard Schaumannon 10/22  to relate his [facility] PDennard Schaumannadded Diltiazem 180 mg.  He has had persistent tachycardia readings on his [facility] app at home despite compliance with Diltiazem 3040mdaily and Metoprolol 10082mwice daily. He has felt poorly since AF began in August of this year despite heart rate readings. He does have periods where his heart rate is in the 60's and he has persistent fatigue and shortness of breath with exertion.   Record indicates pulmonary-wise, he failednintedanib and pirfenidone because of side effects. He has been on an experimental infusionbut otherwise supportive care measures.He has not been interested in pulmonary transplant per notes.  He denies palpitations, recent fevers or chills, chest pain.      Past Medical History:  Diagnosis Date  . Acute diastolic heart failure (HCC[place] . Atrial fibrillation (HCC)    vs flutter  . Atrial flutter ([place] . Atrial tachycardia (HCC[place] . CAD (coronary artery disease)    a. PCI of prox LAD and Cx in 2004. b. PCI to Cx 2008. c. PCI to ISR of Cx in 2011.  . Cardiomyopathy ([place]CC[place]  a. EF 45-50% in 06/2018.  . CMarland Kitchenronic respiratory failure with hypoxia, on home O2 therapy (HCCQuit[place] . CKD (chronic kidney disease), stage III (HCC[place] . COPD (chronic obstructive pulmonary disease) (HCCK[place] . Emphysematous bleb (HCC[place]  a.  s/p pleurectomy/pleurocentesis 2011  . Hyperlipidemia   . Hypertension   .  Hypothyroidism   . Idiopathic pulmonary fibrosis ([place])   . On home oxygen therapy    "3L; 24/7" (04/15/2015)  . Pneumothorax on right 4/11  . Postinflammatory pulmonary fibrosis ([place])   . Sleep apnea    "won't use CPAP" (04/14/2105)  . SVT (supraventricular tachycardia) ([place])    Per Dr. Tanna Furry note  . Type II diabetes mellitus ([place])   . WPW (Wolff-Parkinson-White syndrome)    Per Dr. Tanna Furry Note         Past Surgical History:  Procedure Laterality Date  . BILATERAL VATS  ABLATION Right   . CARDIAC CATHETERIZATION  05/03/2007   patent stents  . CARDIAC CATHETERIZATION N/A 05/18/2015   Procedure: Right Heart Cath;  Surgeon: Belva Crome, MD;  Location: [facility];  Service: Cardiovascular;  Laterality: N/A;  . CORONARY ANGIOPLASTY WITH STENT PLACEMENT  03/05/2003; 2008; 01/13/2010   PCI & Stent  LAD & mid CX; stent to CX  (I've got 4 stents total" (04/15/2015)  . ELECTROPHYSIOLOGIC STUDY N/A 04/14/2015   Procedure: SVT Ablation;  Surgeon: Evans Lance, MD;  Location: [facility];  Service: Cardiovascular;  Laterality: N/A;  . KNEE ARTHROSCOPY Right 01/22/2013   Procedure: RIGHT ARTHROSCOPY KNEE WITH DEBRIDEMENT, abrasion chondroplasty of lateral tibial plateau, debridement of partial tear of ACL, menisectomy;  Surgeon: Tobi Bastos, MD;  Location: [facility];  Service: Orthopedics;  Laterality: Right;  . KNEE SURGERY  2012   "reattached quad"  . LUNG LOBECTOMY Right   . SHOULDER ARTHROSCOPY W/ ROTATOR CUFF REPAIR Bilateral   . VIDEO ASSISTED THORACOSCOPY Left 07/19/2015   Procedure: LEFT VIDEO ASSISTED THORACOSCOPY WITH LEFT UPPER AND LOWER LUNG BIOPSY;  Surgeon: Melrose Nakayama, MD;  Location: [facility];  Service: Thoracic;  Laterality: Left;  Marland Kitchen VIDEO BRONCHOSCOPY N/A 07/19/2015   Procedure: VIDEO BRONCHOSCOPY WITH FLUORO;  Surgeon: Melrose Nakayama, MD;  Location: [facility];  Service: Thoracic;  Laterality: N/A;     Home Medications:   Current Outpatient Medications:  .  ALPRAZolam (XANAX) 0.5 MG tablet, Take 1 tablet (0.5 mg total) by mouth at bedtime as needed for anxiety., Disp: 30 tablet, Rfl: 0 .  apixaban (ELIQUIS) 5 MG TABS tablet, Take 1 tablet (5 mg total) by mouth 2 (two) times daily., Disp: 60 tablet, Rfl: 3 .  aspirin 81 MG tablet, Take 81 mg by mouth every morning. , Disp: , Rfl:  .  atorvastatin (LIPITOR) 40 MG tablet, Take 1 tablet by mouth every night at bedtime, Disp: 90 tablet, Rfl: 1 .  Blood Glucose Monitoring  Suppl (ONE TOUCH ULTRA 2) w/Device KIT, Use to monitor FSBS 4x daily for fluctuating blood sugars. Dx: E11.65. (Patient taking differently: 1 each See admin instructions. Use to monitor FSBS 4x daily for fluctuating blood sugars. Dx: E11.65), Disp: 1 each, Rfl: 0 .  Cholecalciferol (VITAMIN D-3 PO), Take 1 capsule by mouth daily., Disp: , Rfl:  .  Cyanocobalamin (VITAMIN B-12 PO), Take 1 tablet by mouth daily., Disp: , Rfl:  .  dapagliflozin propanediol (FARXIGA) 10 MG TABS tablet, Take 10 mg by mouth daily., Disp: 30 tablet, Rfl: 0 .  digoxin (LANOXIN) 0.125 MG tablet, Take 1 tablet (0.125 mg total) by mouth daily., Disp: 30 tablet, Rfl: 6 .  diltiazem (CARDIZEM CD) 300 MG 24 hr capsule, Take 1 capsule (300 mg total) by mouth daily., Disp: 30 capsule, Rfl: 6 .  furosemide (LASIX) 20 MG tablet, Take 1 tablet (20 mg total) by mouth  daily as needed for fluid (fluid). (Patient taking differently: Take 20 mg by mouth daily. ), Disp: 90 tablet, Rfl: 2 .  glucose blood (ONE TOUCH ULTRA TEST) test strip, Use to monitor FSBS 4x daily for fluctuating blood sugars. Dx: E11.65., Disp: 400 each, Rfl: 2 .  insulin aspart (NOVOLOG) 100 UNIT/ML injection, 15 UNITS WITH BREAKFAST AND 15 UNITS WITH LUNCH, 20 UNITS WITH SUPPER (Patient taking differently: Inject 15-30 Units into the skin See admin instructions. Inject 15 units into the skin after breakfast, 15 units after lunch, and 30 units after supper), Disp: 60 mL, Rfl: 5 .  Investigational - Study Medication, "Title: FGCL-3019-091 (FibroGen Study) is a Phase 3, randomized, double-blind, placebo-controlled multicenter international study to evaluate evaluate the efficacy and safety of 30 mg/kg IV infusions of pamrevlumab administered every 3 weeks for 52 weeks as compared to placebo in subjects with Idiopathic Pulmonary Fibrosis. Primary end point is: change in FVC from baseline at week 52", Disp: , Rfl:  .  LANTUS 100 UNIT/ML injection, INJECT 0.6 MLS (60 UNITS TOTAL)  INTO THE SKIN AT BEDTIME. (Patient taking differently: Inject 60 Units into the skin at bedtime. ), Disp: 60 mL, Rfl: 3 .  levothyroxine (SYNTHROID, LEVOTHROID) 125 MCG tablet, Take 1 tablet (125 mcg total) by mouth daily., Disp: 90 tablet, Rfl: 2 .  MAGNESIUM PO, Take 1 tablet by mouth daily., Disp: , Rfl:  .  Menthol, Topical Analgesic, (BIOFREEZE EX), Apply 1 application topically as needed (for pain)., Disp: , Rfl:  .  metFORMIN (GLUCOPHAGE) 850 MG tablet, Take 1 tablet by mouth twice a day (Patient taking differently: Take 850 mg by mouth 2 (two) times daily with a meal. ), Disp: 180 tablet, Rfl: 2 .  metoprolol tartrate (LOPRESSOR) 100 MG tablet, Take 1 tablet (100 mg total) by mouth 2 (two) times daily., Disp: 180 tablet, Rfl: 3 .  nitroGLYCERIN (NITROSTAT) 0.4 MG SL tablet, Place 1 tablet (0.4 mg total) under the tongue every 5 (five) minutes as needed for chest pain. (Patient not taking: Reported on 10/08/2018), Disp: 25 tablet, Rfl: 1 .  omega-3 acid ethyl esters (LOVAZA) 1 g capsule, Take 2 capsules (2 g total) by mouth 2 (two) times daily., Disp: 360 capsule, Rfl: 3 .  ONETOUCH DELICA LANCETS FINE MISC, Test up to 4 times a day as directed. (Patient taking differently: 1 each See admin instructions. Test up to 4 times a day as directed), Disp: 100 each, Rfl: 5 .  OXYGEN, Inhale 6-8 L into the lungs continuous., Disp: , Rfl:  .  POTASSIUM PO, Take 1 tablet by mouth daily., Disp: , Rfl:  .  SYRINGE/NEEDLE, DISP, 1 ML (B-D SYRINGE/NEEDLE 1CC/25GX5/8) 25G X 5/8" 1 ML MISC, As directed, Disp: 100 each, Rfl: 5 .  zolpidem (AMBIEN) 10 MG tablet, Take 1 tablet by mouth at bedtime., Disp: 90 tablet, Rfl: 0    Allergies:         Allergies  Allergen Reactions  . Niaspan [Niacin] Itching  . Ofev [Nintedanib] Other (See Comments)    "Lethargic and wiped out"  . Pirfenidone Other (See Comments)    Caused the patient to unexpectedly wet the bed in the middle of the night (was never a  problem prior to taking this med)  . Ramipril Cough         Social History:   Social History        Socioeconomic History  . Marital status: Single    Spouse name: Not on file  .  Number of children: 1  . Years of education: Not on file  . Highest education level: Not on file  Occupational History    Employer: [employer]  . Financial resource strain: Not hard at all  . Food insecurity:    Worry: Never true    Inability: Never true  . Transportation needs:    Medical: No    Non-medical: No  Tobacco Use  . Smoking status: Former Smoker    Packs/day: 1.50    Years: 35.00    Pack years: 52.50    Types: Cigarettes    Last attempt to quit: 10/24/2007    Years since quitting: 10.9  . Smokeless tobacco: Never Used  Substance and Sexual Activity  . Alcohol use: Yes    Alcohol/week: 0.0 standard drinks    Comment: 04/15/2015 "I've had a couple drinks in the last 8 months"  . Drug use: No  . Sexual activity: Not Currently  Lifestyle  . Physical activity:    Days per week: 0 days    Minutes per session: 0 min  . Stress: Not at all  Relationships  . Social connections:    Talks on phone: Once a week    Gets together: More than three times a week    Attends religious service: Never    Active member of club or organization: No    Attends meetings of clubs or organizations: Never    Relationship status: Not on file  . Intimate partner violence:    Fear of current or ex partner: Not on file    Emotionally abused: Not on file    Physically abused: Not on file    Forced sexual activity: Not on file  Other Topics Concern  . Not on file  Social History Narrative  . Not on file    Family History:         Family History  Problem Relation Age of Onset  . Breast cancer Mother   . Diabetes type II Mother   . Lung cancer Father      ROS:  Please see the history of present illness.  All  other ROS reviewed and negative.     Physical Exam/Data:      Vitals:   10/10/18 0913  BP: 120/81  Pulse: (!) 137  Resp: 20  Temp: 97.8 F (36.6 C)  TempSrc: Oral  SpO2: 92%  Weight: 112 kg  Height: 6' 1"  (1.854 m)   No intake or output data in the 24 hours ending 10/10/18 1038    Filed Weights   10/10/18 0913  Weight: 112 kg   Body mass index is 32.59 kg/m.  General:  Well nourished, well developed, in no acute distress, +O2 HEENT: normal Cardiac:  irreg-irreg; no murmurs, gallops or rubs Lungs:  Very fine crackles, no wheezing, rhonchi or rales  Abd: soft, nontender  Ext: no edema Musculoskeletal:  No deformities Skin: warm and dry  Neuro:  No gross focal abnormalities noted Psych:  Normal affect   EKG:  The EKG was personally reviewed and demonstrates:   Atypical atrial flutter, rate 125  Relevant CV Studies:  07/18/18: TTE Study Conclusions - Left ventricle: The cavity size was normal. There was mild concentric hypertrophy. Systolic function was mildly reduced. The estimated ejection fraction was in the range of 45% to 50%. Although no diagnostic regional wall motion abnormality was identified, this possibility cannot be completely excluded on the basis of this study. Features are consistent with  a pseudonormal left ventricular filling pattern, with concomitant abnormal relaxation and increased filling pressure (grade 2 diastolic dysfunction). - Mitral valve: Calcified annulus. Systolic bowing without prolapse. - Left atrium: The atrium was mildly dilated. - Right ventricle: The cavity size was mildly dilated. Systolic function was mildly reduced.   Radiology/Studies:  Dg Chest 2 View Result Date: 10/01/2018 CLINICAL DATA:  Chest pain tachycardia EXAM: CHEST - 2 VIEW COMPARISON:  07/11/2018 FINDINGS: Prominent lung markings are present bilaterally right greater than left mostly in the bases. These have been present on  prior studies including chest CT of 05/17/2018 consistent with fibrosis. Negative for superimposed acute infiltrate or effusion.  No edema. IMPRESSION: Chronic lung disease with interstitial fibrosis similar to prior studies. No superimposed acute abnormality. Electronically Signed   By: Franchot Gallo M.D.   On: 10/01/2018 13:22    Assessment and Plan:   1. Persistent atypical atrial flutter and atrial fibrillation In the setting of pulmonary fibrosis It is really difficult to tell how symptomatic he is with his AF. He feels poorly when he is in SR in the 60's as well as AF with RVR. He does not have AAD options with pulmonary fibrosis, CAD, and CKD. QTc is >482mec at baseline.   I am concerned about possible tachycardia mediated cardiomyopathy with persistently elevated rates. He has been very difficult to rate control despite CCB, BB and Dig. When in SR, rates are 50-60's. I am worried about pushing rate control any more. Can consider PPM and AVN ablation; however, we discussed today that I am not sure this will help his symptoms.   2.  Pulmonary fibrosis Per pulmonary  Dr TLovena Leto see later today    For questions or updates, please contact [facility] Please consult [email or web address] for contact info under    Signed, AChanetta Marshall NP  10/10/2018 10:38 AM   EP attending  Patient seen and examined.  Agree with the findings as noted above.  The patient has extremely difficult to control atrial fibrillation and flutter.  Remotely he had WPW syndrome and underwent successful catheter ablation.  The patient has no evidence of recurrent accessory pathway conduction.  His ventricular rates in atrial fibrillation and flutter have been very difficult to control despite multiple AV nodal blocking drugs.  He is a poor candidate for antiarrhythmic drug therapy.  Specifically amiodarone is contraindicated with his pulmonary fibrosis.  I discussed the treatment options in detail with the  patient.  The risks, goals, benefits, and expectations of AV node ablation and insertion of a dual-chamber pacemaker were reviewed in detail.  He would like to proceed with this.  This will be scheduled the next several weeks as her schedule allows.  GCristopher Peru MD  EP Attending  Patient seen and examined. Since my prior consult, no change. We will proceed with AV node ablation and insertion of a DDD PM.  Gregg Taylor,M.D.  GMikle BosworthD.

## 2018-10-29 ENCOUNTER — Ambulatory Visit (HOSPITAL_COMMUNITY): Payer: PPO

## 2018-10-29 ENCOUNTER — Encounter (HOSPITAL_COMMUNITY): Payer: Self-pay | Admitting: Internal Medicine

## 2018-10-29 DIAGNOSIS — E785 Hyperlipidemia, unspecified: Secondary | ICD-10-CM | POA: Diagnosis not present

## 2018-10-29 DIAGNOSIS — I4819 Other persistent atrial fibrillation: Secondary | ICD-10-CM | POA: Diagnosis not present

## 2018-10-29 DIAGNOSIS — Z950 Presence of cardiac pacemaker: Secondary | ICD-10-CM | POA: Diagnosis not present

## 2018-10-29 DIAGNOSIS — J9809 Other diseases of bronchus, not elsewhere classified: Secondary | ICD-10-CM | POA: Diagnosis not present

## 2018-10-29 DIAGNOSIS — I4891 Unspecified atrial fibrillation: Secondary | ICD-10-CM

## 2018-10-29 DIAGNOSIS — I5032 Chronic diastolic (congestive) heart failure: Secondary | ICD-10-CM | POA: Diagnosis not present

## 2018-10-29 DIAGNOSIS — I130 Hypertensive heart and chronic kidney disease with heart failure and stage 1 through stage 4 chronic kidney disease, or unspecified chronic kidney disease: Secondary | ICD-10-CM | POA: Diagnosis not present

## 2018-10-29 DIAGNOSIS — G4730 Sleep apnea, unspecified: Secondary | ICD-10-CM | POA: Diagnosis not present

## 2018-10-29 DIAGNOSIS — E1122 Type 2 diabetes mellitus with diabetic chronic kidney disease: Secondary | ICD-10-CM | POA: Diagnosis not present

## 2018-10-29 DIAGNOSIS — I442 Atrioventricular block, complete: Secondary | ICD-10-CM | POA: Diagnosis not present

## 2018-10-29 DIAGNOSIS — E039 Hypothyroidism, unspecified: Secondary | ICD-10-CM | POA: Diagnosis not present

## 2018-10-29 DIAGNOSIS — I495 Sick sinus syndrome: Secondary | ICD-10-CM | POA: Diagnosis not present

## 2018-10-29 DIAGNOSIS — I484 Atypical atrial flutter: Secondary | ICD-10-CM | POA: Diagnosis not present

## 2018-10-29 DIAGNOSIS — Z87891 Personal history of nicotine dependence: Secondary | ICD-10-CM | POA: Diagnosis not present

## 2018-10-29 DIAGNOSIS — N183 Chronic kidney disease, stage 3 (moderate): Secondary | ICD-10-CM | POA: Diagnosis not present

## 2018-10-29 MED FILL — Heparin Sod (Porcine)-NaCl IV Soln 1000 Unit/500ML-0.9%: INTRAVENOUS | Qty: 500 | Status: AC

## 2018-10-29 MED FILL — Lidocaine HCl Local Inj 1%: INTRAMUSCULAR | Qty: 60 | Status: AC

## 2018-10-29 NOTE — Plan of Care (Signed)
  Problem: Education: Goal: Knowledge of General Education information will improve Description Including pain rating scale, medication(s)/side effects and non-pharmacologic comfort measures Outcome: Progressing   Problem: Clinical Measurements: Goal: Ability to maintain clinical measurements within normal limits will improve Outcome: Progressing Goal: Will remain free from infection Outcome: Progressing   Problem: Nutrition: Goal: Adequate nutrition will be maintained Outcome: Completed/Met   Problem: Safety: Goal: Ability to remain free from injury will improve Outcome: Completed/Met

## 2018-10-29 NOTE — Discharge Summary (Addendum)
ELECTROPHYSIOLOGY PROCEDURE DISCHARGE SUMMARY    Patient ID: Jerry Montgomery,  MRN: [id number], DOB/AGE: August 11, 1956 63 y.o.  Admit date: 10/28/2018 Discharge date: 10/29/2018  Primary Care Physician: [name], MD  Primary Cardiologist: Dr. Claiborne Billings Electrophysiologist: Dr. Lovena Le  Primary Discharge Diagnosis:  1. persistent AFib/flutter, extremely difficult to control     S/p PPM with AV node ablation this admission     CHA2DS2Vasc is 3, on Eliquis  Secondary Discharge Diagnosis:  1. IPF 2. CAD 3. HTN 4. DM  Allergies  Allergen Reactions  . Niaspan [Niacin] Itching  . Ofev [Nintedanib] Other (See Comments)    "Lethargic and wiped out"  . Pirfenidone Other (See Comments)    Caused the patient to unexpectedly wet the bed in the middle of the night (was never a problem prior to taking this med)  . Ramipril Cough          Procedures This Admission:  1.  Implantation of a MDT dual chamber PPM on 10/28/18 by Dr Lovena Le.  The patient received a medtronic (serial number [id number]) pacemaker, Medtronic [id number] (serial number [id number]) right atrial lead and a Medtronic [id number] (serial number [id number]) right ventricular lead There were no immediate post procedure complications. CXR on 10/29/18 demonstrated no pneumothorax status post device implantation.  2. AV node ablation CONCLUSIONS:   1. Successful implantation of a Medtronic dual-chamber pacemaker for symptomatic tachy-bradycardia due to persistent atrial fib/flutter followed by successful AV node ablation with the creation of CHB.  2. No early apparent complications.   Brief HPI: Jerry Montgomery is a 63 y.o. male was referred to electrophysiology in the outpatient setting for AFib/flutter very difficult to control.  Past medical history includes above.  The patient was recommended PPM with AV node ablation.  Risks, benefits, and alternatives to PPM implantation and AV node ablation were reviewed with the patient who wished  to proceed.   Hospital Course:  The patient was admitted and underwent implantation of a PPM with details as outlined above followed by AV node ablation.  He was monitored on telemetry overnight which demonstrated SR > AFlutter w/V paced rhythm.  Left chest was without hematoma or ecchymosis.  The device was interrogated and found to be functioning normally.  CXR was obtained and demonstrated no pneumothorax status post device implantation.  Wound care, arm mobility, and restrictions were reviewed with the patient.  The patient feels well this AM, at his baseline with no CP, he was examined by Dr. Lovena Le and considered stable for discharge to home.    Physical Exam: Vitals:   10/28/18 1737 10/28/18 1939 10/29/18 0629 10/29/18 0740  BP: 125/64 (!) 150/76 (!) 154/82 (!) 134/99  Pulse:  68 (!) 56 79  Resp: (!) 22 15 (!) 26 (!) 21  Temp:   97.9 F (36.6 C) 98 F (36.7 C)  TempSrc:   Oral Oral  SpO2: 92% 92% 94% 97%  Weight:   106.5 kg   Height:        GEN- The patient is well appearing, alert and oriented x 3 today.   HEENT: normocephalic, atraumatic; sclera clear, conjunctiva pink; hearing intact; oropharynx clear; neck supple, no JVP Lungs- Clear to ausculation bilaterally, normal work of breathing.  No wheezes, rales, rhonchi Heart- Regular rate and rhythm, no murmurs, rubs or gallops, PMI not laterally displaced GI- soft, non-tender, non-distended, bowel sounds present, no hepatosplenomegaly Extremities- no clubbing, cyanosis, or edema; DP/PT/radial pulses 2+ bilaterally MS- no significant deformity or atrophy  Skin- warm and dry, no rash or lesion, left chest without hematoma/ecchymosis Psych- euthymic mood, full affect Neuro- no gross deficits   Labs:   Lab Results  Component Value Date   WBC 9.2 10/10/2018   HGB 15.1 10/10/2018   HCT 47.2 10/10/2018   MCV 87.4 10/10/2018   PLT 216 10/10/2018   No results for input(s): NA, K, CL, CO2, BUN, CREATININE, CALCIUM, PROT,  BILITOT, ALKPHOS, ALT, AST, GLUCOSE in the last 168 hours.  Invalid input(s): LABALBU  Discharge Medications:  Allergies as of 10/29/2018      Reactions   Niaspan [niacin] Itching   Ofev [nintedanib] Other (See Comments)   "Lethargic and wiped out"   Pirfenidone Other (See Comments)   Caused the patient to unexpectedly wet the bed in the middle of the night (was never a problem prior to taking this med)   Ramipril Cough         Medication List    STOP taking these medications   digoxin 0.125 MG tablet Commonly known as:  LANOXIN     TAKE these medications   ALPRAZolam 0.5 MG tablet Commonly known as:  XANAX Take 1 tablet (0.5 mg total) by mouth at bedtime as needed for anxiety.   apixaban 5 MG Tabs tablet Commonly known as:  ELIQUIS Take 1 tablet (5 mg total) by mouth 2 (two) times daily. Notes to patient:  Do not resume until Friday 11/01/2018 morning dose   aspirin 81 MG tablet Take 81 mg by mouth every morning.   atorvastatin 40 MG tablet Commonly known as:  LIPITOR Take 1 tablet by mouth every night at bedtime   BIOFREEZE EX Apply 1 application topically as needed (for pain).   dapagliflozin propanediol 10 MG Tabs tablet Commonly known as:  FARXIGA Take 10 mg by mouth daily.   diltiazem 300 MG 24 hr capsule Commonly known as:  CARDIZEM CD Take 1 capsule (300 mg total) by mouth daily.   furosemide 20 MG tablet Commonly known as:  LASIX Take 1 tablet (20 mg total) by mouth daily as needed for fluid (fluid). What changed:  when to take this   glucose blood test strip Commonly known as:  ONE TOUCH ULTRA TEST Use to monitor FSBS 4x daily for fluctuating blood sugars. Dx: E11.65.   insulin aspart 100 UNIT/ML injection Commonly known as:  NOVOLOG 15 UNITS WITH BREAKFAST AND 15 UNITS WITH LUNCH, 20 UNITS WITH SUPPER What changed:    how much to take  how to take this  when to take this  additional instructions   Investigational - Study  Medication "Title: FGCL-3019-091 ([employer] Study) is a Phase 3, randomized, double-blind, placebo-controlled multicenter international study to evaluate evaluate the efficacy and safety of 30 mg/kg IV infusions of pamrevlumab administered every 3 weeks for 52 weeks as compared to placebo in subjects with Idiopathic Pulmonary Fibrosis. Primary end point is: change in FVC from baseline at week 52"   LANTUS 100 UNIT/ML injection Generic drug:  insulin glargine INJECT 0.6 MLS (60 UNITS TOTAL) INTO THE SKIN AT BEDTIME. What changed:  See the new instructions.   levothyroxine 125 MCG tablet Commonly known as:  SYNTHROID, LEVOTHROID Take 1 tablet (125 mcg total) by mouth daily.   MAGNESIUM PO Take 1 tablet by mouth daily.   metFORMIN 850 MG tablet Commonly known as:  GLUCOPHAGE Take 1 tablet by mouth twice a day What changed:  when to take this   nitroGLYCERIN 0.4 MG SL tablet Commonly known  as:  NITROSTAT Place 1 tablet (0.4 mg total) under the tongue every 5 (five) minutes as needed for chest pain.   omega-3 acid ethyl esters 1 g capsule Commonly known as:  LOVAZA Take 2 capsules (2 g total) by mouth 2 (two) times daily.   ONE TOUCH ULTRA 2 w/Device Kit Use to monitor FSBS 4x daily for fluctuating blood sugars. Dx: E11.65. What changed:  See the new instructions.   ONETOUCH DELICA LANCETS FINE Misc Test up to 4 times a day as directed. What changed:  See the new instructions.   OXYGEN Inhale 6-8 L into the lungs continuous.   POTASSIUM PO Take 1 tablet by mouth daily.   SYRINGE/NEEDLE (DISP) 1 ML 25G X 5/8" 1 ML Misc Commonly known as:  B-D SYRINGE/NEEDLE 1CC/25GX5/8 As directed   VITAMIN B-12 PO Take 1 tablet by mouth daily.   VITAMIN D-3 PO Take 1 capsule by mouth daily.   zolpidem 10 MG tablet Commonly known as:  AMBIEN Take 1 tablet by mouth at bedtime.       Disposition: Home Discharge Instructions    Diet - low sodium heart healthy   Complete by:  As  directed    Increase activity slowly   Complete by:  As directed      Follow-up Information    [facility] Follow up.   Specialty:  Cardiology Why:  11/07/2018 @ 9:00AM, wound check visit Contact information: [address], [place]       [facility] Follow up.   Specialty:  Cardiology Why:  11/21/2018 @ 9:00AM, pacemaker reprogramming visit Contact information: [address], [address] [place]       Evans Lance, MD Follow up.   Specialty:  Cardiology Why:  01/29/2019 @ 2:15PM Contact information: [address] [address] [phone]           Duration of Discharge Encounter: Greater than 30 minutes including physician time.  Venetia Night, PA-C 10/29/2018 10:33 AM   EP Attending  Patient seen and examined. Agree with above. He is doing well after PPM insertion and AV node ablation. He is stable for DC home with usual followup. PPM interogation under my direct supervision demonstrates normal device function. He will hold his anti-coagulation for 2 days.  Mikle Bosworth.D.

## 2018-10-29 NOTE — Discharge Instructions (Signed)
Right groin site care instructions No lifting over 5 lbs for 1 week. No sexual activity for 1 week.Keep procedure site clean & dry. If you notice increased pain, swelling, bleeding or pus, call/return!  No soaking baths/hot tubs/pools for 1 week.  You can remove the bandage today, no need to replace       Supplemental Discharge Instructions for  Pacemaker/Defibrillator Patients  Activity No heavy lifting or vigorous activity with your left/right arm for 6 to 8 weeks.  Do not raise your left/right arm above your head for one week.  Gradually raise your affected arm as drawn below.             11/01/18                     11/02/18                    11/03/18                   11/04/18 __  NO DRIVING for 1 week ; you may begin driving on  8/89/16 .  WOUND CARE - Keep the wound area clean and dry.  Do not get this area wet for one week. No showers for one week; you may shower on  11/04/18   . - The tape/steri-strips on your wound will fall off; do not pull them off.  No bandage is needed on the site.  DO  NOT apply any creams, oils, or ointments to the wound area. - If you notice any drainage or discharge from the wound, any swelling or bruising at the site, or you develop a fever > 101? F after you are discharged home, call the office at once.  Special Instructions - You are still able to use cellular telephones; use the ear opposite the side where you have your pacemaker/defibrillator.  Avoid carrying your cellular phone near your device. - When traveling through airports, show security personnel your identification card to avoid being screened in the metal detectors.  Ask the security personnel to use the hand wand. - Avoid arc welding equipment, MRI testing (magnetic resonance imaging), TENS units (transcutaneous nerve stimulators).  Call the office for questions about other devices. - Avoid electrical appliances that are in poor condition or are not properly grounded. - Microwave ovens are  safe to be near or to operate.

## 2018-10-30 ENCOUNTER — Encounter (HOSPITAL_COMMUNITY): Payer: PPO

## 2018-11-01 ENCOUNTER — Ambulatory Visit: Payer: PPO | Admitting: Cardiovascular Disease

## 2018-11-06 ENCOUNTER — Ambulatory Visit (HOSPITAL_COMMUNITY)
Admission: RE | Admit: 2018-11-06 | Discharge: 2018-11-06 | Disposition: A | Discharge status: Home / Self Care | Payer: PPO | Source: Ambulatory Visit | Attending: Internal Medicine | Admitting: Internal Medicine

## 2018-11-06 DIAGNOSIS — J84112 Idiopathic pulmonary fibrosis: Secondary | ICD-10-CM | POA: Insufficient documentation

## 2018-11-06 DIAGNOSIS — Z006 Encounter for examination for normal comparison and control in clinical research program: Secondary | ICD-10-CM

## 2018-11-06 MED ORDER — STUDY - FIBROGEN - PAMREVLUMAB OR PLACEBO 10 MG/ML IV INFUSION (PI-RAMASWAMY)
30.0000 mg/kg | Freq: Once | INTRAVENOUS | Status: AC
  Administered 2018-11-06: 3350 mg via INTRAVENOUS
  Filled 2018-11-06: qty 335

## 2018-11-06 NOTE — Research (Signed)
Title: FGCL-3019-091 ([employer] Study) is a Phase 3, randomized, double-blind, placebo-controlled multicenter international study to evaluate evaluate the efficacy and safety of 30 mg/kg IV infusions of pamrevlumab administered every 3 weeks for 52 weeks as compared to placebo in subjects with Idiopathic Pulmonary Fibrosis. Primary end point is: change in FVC from baseline at week 52  Protocol #: FGCL-3019-091, Clinical Trials #: [id number] Sponsor: [email or web address]  [address] [place], [place], [place])  Protocol Version for 11/06/2018 date of amendment 2 25SEP2019 Consent Version for 11/06/2018 date of [facility] IRB approved Version 29OCT2019 Investigator Brochure Version for 11/06/2018 date of v17 25OCT2018  Key Features of Pamrevlumab (FG-3019) the study drug: a recombinant fully human IgG kappa monoclonal antibody that binds to CTGF and is being developed for treatment of diseases in which tissue fibrosis has a major pathogenic role. In particular, pamrevlumab appears to disrupt a CTGF autocrine loop in mesenchymal cells like myofibroblasts that reduces their recruitment of leukocytes like macrophages, mast and dendritic cells via chemokine secretion. This disruption results in collapse of the cellular crosstalk that drives tissue remodeling.   Clinical Research Coordinator note : This visit for [name] with DOB: 1955/10/28 on 11/06/2018 for the above protocol is Visit/Encounter Week 21 visit  and is for purpose of research . The consent for this encounter is  currently IRB approved. The subject expressed continued interest and consent in continuing as a study subject. Subject confirmed that there was  no change in contact information (e.g. address, telephone, email). Subject thanked for participation in research and contribution to science.   In this visit 11/06/2018 the  investigator named Dr. [name] was readily available and participated in the visit via video conference to assess the patient. The  patient reports for his visit today feeling good. The subject underwent AV node ablation and Pacemaker implant procedures since last visit (both completed on 06JAN2020) the subject states that since procedures he has maintained a heart rate of 80 bpm. Patient only complains of soreness (chest) since procedure. And still present today. Patient states not taking any medication for pain management. The subject has follow up appointment with Dr. [name] [id number] for follow up post operation. No complications reported in reference to the procedures on 06JAN2020.   Subject was ok'd by Dr. [name] to proceed with treatment today as planned. Subject was infused entire IP over the course of 1 hour. And had 1 hour of post observation. All pre and post study procedures were completed as per required by above stated protocol.  IV line was placed at 08:55  And remained in place throughout the entire visit and was removed at 12:15.  Supporting staff for today's visit as follows:  [name] CPhT, Pharmacy, IP prep Joesph July RN, Nurse on duty   Subject was discharged from Infusion center at 12:20 with no complaints.  For further details regarding today's visit please refer to subject paper source binder  Signed by  T [name] BS, CCRC, CPhT Clinical Research Coordinator  [place], [place] 14:15 11/06/2018

## 2018-11-07 ENCOUNTER — Ambulatory Visit (INDEPENDENT_AMBULATORY_CARE_PROVIDER_SITE_OTHER): Payer: PPO | Admitting: Nurse Practitioner

## 2018-11-07 DIAGNOSIS — I480 Paroxysmal atrial fibrillation: Secondary | ICD-10-CM

## 2018-11-07 LAB — CUP PACEART INCLINIC DEVICE CHECK
Date Time Interrogation Session: 20200116092219
Implantable Lead Implant Date: 20200106
Implantable Lead Implant Date: 20200106
Implantable Lead Location: 753859
Implantable Lead Location: 753860
Implantable Lead Model: 3830
Implantable Lead Model: 5076
MDC IDC PG IMPLANT DT: 20200106

## 2018-11-07 NOTE — Progress Notes (Signed)
Wound check appointment. Steri-strips removed. Wound without redness or edema. Incision edges approximated, wound well healed. Normal device function. Thresholds, sensing, and impedances consistent with implant measurements. Device programmed at 3.5V/auto capture programmed on for extra safety margin until 3 month visit. His Capture septal down to loss of capture at 0.75V. Histogram distribution appropriate for patient and level of activity. No mode switches or high ventricular rates noted. Patient educated about wound care, arm mobility, lifting restrictions. ROV in 4 weeks with device clinic for reprogramming post AVN ablation.

## 2018-11-09 DIAGNOSIS — R269 Unspecified abnormalities of gait and mobility: Secondary | ICD-10-CM | POA: Diagnosis not present

## 2018-11-09 DIAGNOSIS — J449 Chronic obstructive pulmonary disease, unspecified: Secondary | ICD-10-CM | POA: Diagnosis not present

## 2018-11-09 DIAGNOSIS — J438 Other emphysema: Secondary | ICD-10-CM | POA: Diagnosis not present

## 2018-11-09 DIAGNOSIS — J8410 Pulmonary fibrosis, unspecified: Secondary | ICD-10-CM | POA: Diagnosis not present

## 2018-11-19 ENCOUNTER — Encounter: Payer: Self-pay | Admitting: Family Medicine

## 2018-11-19 ENCOUNTER — Ambulatory Visit (INDEPENDENT_AMBULATORY_CARE_PROVIDER_SITE_OTHER): Payer: PPO | Admitting: Family Medicine

## 2018-11-19 VITALS — BP 120/72 | HR 88 | Temp 98.0°F | Resp 24 | Ht 73.0 in | Wt 241.0 lb

## 2018-11-19 DIAGNOSIS — R0902 Hypoxemia: Secondary | ICD-10-CM | POA: Diagnosis not present

## 2018-11-19 LAB — BASIC METABOLIC PANEL WITH GFR
BUN: 20 mg/dL (ref 7–25)
CHLORIDE: 103 mmol/L (ref 98–110)
CO2: 26 mmol/L (ref 20–32)
Calcium: 9.2 mg/dL (ref 8.6–10.3)
Creat: 1.22 mg/dL (ref 0.70–1.25)
GFR, Est African American: 72 mL/min/{1.73_m2} (ref >=60)
GFR, Est Non African American: 62 mL/min/{1.73_m2} (ref >=60)
Glucose, Bld: 142 mg/dL — ABNORMAL HIGH (ref 65–99)
Potassium: 3.7 mmol/L (ref 3.5–5.3)
Sodium: 140 mmol/L (ref 135–146)

## 2018-11-19 LAB — CBC WITH DIFFERENTIAL/PLATELET
Absolute Monocytes: 1028 cells/uL — ABNORMAL HIGH (ref 200–950)
Basophils Absolute: 53 cells/uL (ref 0–200)
Basophils Relative: 0.5 %
Eosinophils Absolute: 212 cells/uL (ref 15–500)
Eosinophils Relative: 2 %
HCT: 41.8 % (ref 38.5–50.0)
Hemoglobin: 14.1 g/dL (ref 13.2–17.1)
Lymphs Abs: 1346 cells/uL (ref 850–3900)
MCH: 28.7 pg (ref 27.0–33.0)
MCHC: 33.7 g/dL (ref 32.0–36.0)
MCV: 85.1 fL (ref 80.0–100.0)
MPV: 10.2 fL (ref 7.5–12.5)
Monocytes Relative: 9.7 %
Neutro Abs: 7961 cells/uL — ABNORMAL HIGH (ref 1500–7800)
Neutrophils Relative %: 75.1 %
Platelets: 216 10*3/uL (ref 140–400)
RBC: 4.91 10*6/uL (ref 4.20–5.80)
RDW: 14.1 % (ref 11.0–15.0)
Total Lymphocyte: 12.7 %
WBC: 10.6 10*3/uL (ref 3.8–10.8)

## 2018-11-19 MED ORDER — AMOXICILLIN 500 MG PO CAPS: 1000.0000 mg | ORAL_CAPSULE | Freq: Three times a day (TID) | ORAL | 0 refills | Status: DC

## 2018-11-19 MED ORDER — PREDNISONE 20 MG PO TABS: 60.0000 mg | ORAL_TABLET | Freq: Every day | ORAL | 0 refills | Status: DC

## 2018-11-19 MED ORDER — METHYLPREDNISOLONE ACETATE 80 MG/ML IJ SUSP
80.0000 mg | Freq: Once | INTRAMUSCULAR | Status: AC
  Administered 2018-11-19: 80 mg via INTRAMUSCULAR

## 2018-11-19 MED ORDER — AZITHROMYCIN 250 MG PO TABS: ORAL_TABLET | ORAL | 0 refills | Status: DC

## 2018-11-19 NOTE — Addendum Note (Signed)
Addended by: Legrand RamsWILLIS, Sharnise Blough B on: 11/19/2018 03:53 PM   Modules accepted: Orders

## 2018-11-19 NOTE — Progress Notes (Signed)
Subjective:    Patient ID: Jerry Montgomery, male    DOB: June 11, 1956, 63 y.o.   MRN: [id number]  HPI   Patient was recently admitted to the hospital on January 6 for pacemaker placement with AV nodal ablation due to his persistent A. fib/flutter that was difficult to control.  Charge from the hospital on January 7.  Postoperative chest x-ray showed no evidence of pneumothorax.  Digoxin was discontinued.  He was continued on diltiazem 300 mg a day.    Over the last 48 hours, the patient has noticed increasing shortness of breath.  He is normally on 6 L at home.  However he was satting 80% on 6 L at home.  They have cranked his oxygen up to 8 L and he is 90% at home.  He denies any chest pain.  He denies any pleurisy.  He denies any fevers or chills.  However for 1 week he has had a runny nose and some congestion prior to this.  He did cough yesterday and had some green sputum.  On examination, he has diminished breath sounds bilaterally with inspiratory crackles in both bases which are chronic.  There are no wheezes.  There is no pitting edema in his extremities or evidence of fluid overload on exam. Past Medical History:  Diagnosis Date  . Acute diastolic heart failure ([place])   . Atrial fibrillation (H[facility])    vs flutter  . Atrial flutter ([place])   . Atrial tachycardia ([place])   . CAD (coronary artery disease)    a. PCI of prox LAD and Cx in 2004. b. PCI to Cx 2008. c. PCI to ISR of Cx in 2011.  . Cardiomyopathy ([place])    a. EF 45-50% in 06/2018.  Marland Kitchen Chronic respiratory failure with hypoxia, on home O2 therapy ([place])   . CKD (chronic kidney disease), stage III ([place])   . COPD (chronic obstructive pulmonary disease) ([place])   . Emphysematous bleb ([place])    a.  s/p pleurectomy/pleurocentesis 2011  . Hyperlipidemia   . Hypertension   . Hypothyroidism   . Idiopathic pulmonary fibrosis ([place])   . On home oxygen therapy    "3L; 24/7" (04/15/2015)  . Pneumothorax on right 4/11  . Postinflammatory pulmonary  fibrosis ([place])   . Sleep apnea    "won't use CPAP" (04/14/2105)  . SVT (supraventricular tachycardia) ([place])    Per Dr. Tanna Furry note  . Type II diabetes mellitus ([place])   . WPW (Wolff-Parkinson-White syndrome)    Per Dr. Tanna Furry Note   Past Surgical History:  Procedure Laterality Date  . AV NODE ABLATION N/A 10/28/2018   Procedure: AV NODE ABLATION;  Surgeon: Evans Lance, MD;  Location: [facility];  Service: Cardiovascular;  Laterality: N/A;  . BILATERAL VATS ABLATION Right   . CARDIAC CATHETERIZATION  05/03/2007   patent stents  . CARDIAC CATHETERIZATION N/A 05/18/2015   Procedure: Right Heart Cath;  Surgeon: Belva Crome, MD;  Location: [facility];  Service: Cardiovascular;  Laterality: N/A;  . CORONARY ANGIOPLASTY WITH STENT PLACEMENT  03/05/2003; 2008; 01/13/2010   PCI & Stent  LAD & mid CX; stent to CX  (I've got 4 stents total" (04/15/2015)  . ELECTROPHYSIOLOGIC STUDY N/A 04/14/2015   Procedure: SVT Ablation;  Surgeon: Evans Lance, MD;  Location: [facility];  Service: Cardiovascular;  Laterality: N/A;  . KNEE ARTHROSCOPY Right 01/22/2013   Procedure: RIGHT ARTHROSCOPY KNEE WITH DEBRIDEMENT, abrasion chondroplasty of lateral tibial  plateau, debridement of partial tear of ACL, menisectomy;  Surgeon: Tobi Bastos, MD;  Location: [facility];  Service: Orthopedics;  Laterality: Right;  . KNEE SURGERY  2012   "reattached quad"  . LUNG LOBECTOMY Right   . PACEMAKER IMPLANT N/A 10/28/2018   Procedure: PACEMAKER IMPLANT;  Surgeon: Evans Lance, MD;  Location: [facility];  Service: Cardiovascular;  Laterality: N/A;  . SHOULDER ARTHROSCOPY W/ ROTATOR CUFF REPAIR Bilateral   . VIDEO ASSISTED THORACOSCOPY Left 07/19/2015   Procedure: LEFT VIDEO ASSISTED THORACOSCOPY WITH LEFT UPPER AND LOWER LUNG BIOPSY;  Surgeon: Melrose Nakayama, MD;  Location: [facility];  Service: Thoracic;  Laterality: Left;  Marland Kitchen VIDEO BRONCHOSCOPY N/A 07/19/2015   Procedure: VIDEO BRONCHOSCOPY  WITH FLUORO;  Surgeon: Melrose Nakayama, MD;  [facility];  Service: Thoracic;  Laterality: N/A;   Current Outpatient Medications on File Prior to Visit  Medication Sig Dispense Refill  . ALPRAZolam (XANAX) 0.5 MG tablet Take 1 tablet (0.5 mg total) by mouth at bedtime as needed for anxiety. 30 tablet 0  . apixaban (ELIQUIS) 5 MG TABS tablet Take 1 tablet (5 mg total) by mouth 2 (two) times daily. 60 tablet 3  . aspirin 81 MG tablet Take 81 mg by mouth every morning.     Marland Kitchen atorvastatin (LIPITOR) 40 MG tablet Take 1 tablet by mouth every night at bedtime 90 tablet 1  . Blood Glucose Monitoring Suppl (ONE TOUCH ULTRA 2) w/Device KIT Use to monitor FSBS 4x daily for fluctuating blood sugars. Dx: E11.65. (Patient taking differently: 1 each See admin instructions. Use to monitor FSBS 4x daily for fluctuating blood sugars. Dx: E11.65) 1 each 0  . Cholecalciferol (VITAMIN D-3 PO) Take 1 capsule by mouth daily.    . Cyanocobalamin (VITAMIN B-12 PO) Take 1 tablet by mouth daily.    . dapagliflozin propanediol (FARXIGA) 10 MG TABS tablet Take 10 mg by mouth daily. 30 tablet 0  . diltiazem (CARDIZEM CD) 300 MG 24 hr capsule Take 1 capsule (300 mg total) by mouth daily. 30 capsule 6  . furosemide (LASIX) 20 MG tablet Take 1 tablet (20 mg total) by mouth daily as needed for fluid (fluid). (Patient taking differently: Take 20 mg by mouth daily. ) 90 tablet 2  . glucose blood (ONE TOUCH ULTRA TEST) test strip Use to monitor FSBS 4x daily for fluctuating blood sugars. Dx: E11.65. 400 each 2  . insulin aspart (NOVOLOG) 100 UNIT/ML injection 15 UNITS WITH BREAKFAST AND 15 UNITS WITH LUNCH, 20 UNITS WITH SUPPER (Patient taking differently: Inject 15-30 Units into the skin See admin instructions. Inject 15 units into the skin after breakfast, 15 units after lunch, and 30 units after supper) 60 mL 5  . Investigational - Study Medication "Title: FGCL-3019-091 (FibroGen Study) is a Phase 3, randomized,  double-blind, placebo-controlled multicenter international study to evaluate evaluate the efficacy and safety of 30 mg/kg IV infusions of pamrevlumab administered every 3 weeks for 52 weeks as compared to placebo in subjects with Idiopathic Pulmonary Fibrosis. Primary end point is: change in FVC from baseline at week 52"    . LANTUS 100 UNIT/ML injection INJECT 0.6 MLS (60 UNITS TOTAL) INTO THE SKIN AT BEDTIME. (Patient taking differently: Inject 60 Units into the skin at bedtime. ) 60 mL 3  . levothyroxine (SYNTHROID, LEVOTHROID) 125 MCG tablet Take 1 tablet (125 mcg total) by mouth daily. 90 tablet 2  . MAGNESIUM PO Take 1 tablet by mouth daily.    Marland Kitchen  Menthol, Topical Analgesic, (BIOFREEZE EX) Apply 1 application topically as needed (for pain).    . metFORMIN (GLUCOPHAGE) 850 MG tablet Take 1 tablet by mouth twice a day (Patient taking differently: Take 850 mg by mouth 2 (two) times daily with a meal. ) 180 tablet 2  . nitroGLYCERIN (NITROSTAT) 0.4 MG SL tablet Place 1 tablet (0.4 mg total) under the tongue every 5 (five) minutes as needed for chest pain. 25 tablet 1  . omega-3 acid ethyl esters (LOVAZA) 1 g capsule Take 2 capsules (2 g total) by mouth 2 (two) times daily. 360 capsule 3  . ONETOUCH DELICA LANCETS FINE MISC Test up to 4 times a day as directed. (Patient taking differently: 1 each See admin instructions. Test up to 4 times a day as directed) 100 each 5  . OXYGEN Inhale 6-8 L into the lungs continuous.    Marland Kitchen POTASSIUM PO Take 1 tablet by mouth daily.    . SYRINGE/NEEDLE, DISP, 1 ML (B-D SYRINGE/NEEDLE 1CC/25GX5/8) 25G X 5/8" 1 ML MISC As directed 100 each 5  . zolpidem (AMBIEN) 10 MG tablet Take 1 tablet by mouth at bedtime. 90 tablet 0   No current facility-administered medications on file prior to visit.    Allergies  Allergen Reactions  . Niaspan [Niacin] Itching  . Ofev [Nintedanib] Other (See Comments)    "Lethargic and wiped out"  . Pirfenidone Other (See Comments)    Caused  the patient to unexpectedly wet the bed in the middle of the night (was never a problem prior to taking this med)  . Ramipril Cough        Social History   Socioeconomic History  . Marital status: Single    Spouse name: Not on file  . Number of children: 1  . Years of education: Not on file  . Highest education level: Not on file  Occupational History    Employer: [employer]  . Financial resource strain: Not hard at all  . Food insecurity:    Worry: Never true    Inability: Never true  . Transportation needs:    Medical: No    Non-medical: No  Tobacco Use  . Smoking status: Former Smoker    Packs/day: 1.50    Years: 35.00    Pack years: 52.50    Types: Cigarettes    Last attempt to quit: 10/24/2007    Years since quitting: 11.0  . Smokeless tobacco: Never Used  Substance and Sexual Activity  . Alcohol use: Yes    Alcohol/week: 0.0 standard drinks    Comment: 04/15/2015 "I've had a couple drinks in the last 8 months"  . Drug use: No  . Sexual activity: Not Currently  Lifestyle  . Physical activity:    Days per week: 0 days    Minutes per session: 0 min  . Stress: Not at all  Relationships  . Social connections:    Talks on phone: Once a week    Gets together: More than three times a week    Attends religious service: Never    Active member of club or organization: No    Attends meetings of clubs or organizations: Never    Relationship status: Not on file  . Intimate partner violence:    Fear of current or ex partner: Not on file    Emotionally abused: Not on file    Physically abused: Not on file    Forced sexual activity: Not on file  Other  Topics Concern  . Not on file  Social History Narrative  . Not on file       Review of Systems  All other systems reviewed and are negative.      Objective:   Physical Exam Vitals signs reviewed.  Constitutional:      General: He is not in acute distress.    Appearance: He is  well-developed. He is not diaphoretic.  Neck:     Thyroid: No thyromegaly.     Vascular: No JVD.  Cardiovascular:     Rate and Rhythm: Normal rate and regular rhythm.     Pulses: Normal pulses.     Heart sounds: Normal heart sounds, S1 normal and S2 normal. No murmur. No friction rub. No gallop.   Pulmonary:     Effort: No respiratory distress.     Breath sounds: No stridor. Examination of the right-lower field reveals rales. Examination of the left-lower field reveals rales. Decreased breath sounds and rales present. No wheezing.  Chest:     Chest wall: No tenderness.  Abdominal:     General: Bowel sounds are normal.     Palpations: Abdomen is soft.      Patient is sitting comfortably in no distress with a normal respiratory rate with no increased work of breathing     Assessment & Plan:  Hypoxia - Plan: DG Chest 2 View, Brain natriuretic peptide, CBC with Differential/Platelet, BASIC METABOLIC PANEL WITH GFR  I believe the patient is having acute exacerbation of his pulmonary fibrosis likely triggered by recent upper respiratory infection.  I will obtain a chest x-ray, CBC, BNP.  Continue oxygen at home 6 to 8 L as necessary to maintain oxygen saturation greater than 90.  Begin Depo-Medrol 80 mg IM x1 now and then start prednisone 60 mg a day at home.  Add amoxicillin 1 g p.o. 3 times daily as well as a Z-Pak to cover for possible community-acquired pneumonia until the results of the chest x-ray are back given the bibasilar crackles.  Reassess in 24 to 48 hours or go to the emergency room immediately if shortness of breath worsens or he develops increased work of breathing

## 2018-11-20 ENCOUNTER — Ambulatory Visit
Admission: RE | Admit: 2018-11-20 | Discharge: 2018-11-20 | Disposition: A | Discharge status: Home / Self Care | Payer: PPO | Source: Ambulatory Visit | Attending: Family Medicine | Admitting: Family Medicine

## 2018-11-20 DIAGNOSIS — J189 Pneumonia, unspecified organism: Secondary | ICD-10-CM | POA: Diagnosis not present

## 2018-11-20 DIAGNOSIS — R0902 Hypoxemia: Secondary | ICD-10-CM

## 2018-11-20 LAB — BRAIN NATRIURETIC PEPTIDE: Brain Natriuretic Peptide: 242 pg/mL — ABNORMAL HIGH (ref <100)

## 2018-11-21 ENCOUNTER — Ambulatory Visit (HOSPITAL_COMMUNITY)
Admission: RE | Admit: 2018-11-21 | Discharge: 2018-11-21 | Disposition: A | Discharge status: Home / Self Care | Payer: PPO | Source: Ambulatory Visit | Attending: Internal Medicine | Admitting: Internal Medicine

## 2018-11-21 ENCOUNTER — Ambulatory Visit (INDEPENDENT_AMBULATORY_CARE_PROVIDER_SITE_OTHER): Payer: PPO | Admitting: Nurse Practitioner

## 2018-11-21 ENCOUNTER — Ambulatory Visit (INDEPENDENT_AMBULATORY_CARE_PROVIDER_SITE_OTHER): Payer: PPO | Admitting: Family Medicine

## 2018-11-21 ENCOUNTER — Encounter: Payer: Self-pay | Admitting: Family Medicine

## 2018-11-21 VITALS — BP 126/80 | HR 80 | Temp 97.5°F | Resp 24 | Ht 73.0 in | Wt 241.0 lb

## 2018-11-21 DIAGNOSIS — Z79890 Hormone replacement therapy (postmenopausal): Secondary | ICD-10-CM | POA: Diagnosis not present

## 2018-11-21 DIAGNOSIS — Z888 Allergy status to other drugs, medicaments and biological substances status: Secondary | ICD-10-CM | POA: Insufficient documentation

## 2018-11-21 DIAGNOSIS — J9611 Chronic respiratory failure with hypoxia: Secondary | ICD-10-CM | POA: Insufficient documentation

## 2018-11-21 DIAGNOSIS — G4730 Sleep apnea, unspecified: Secondary | ICD-10-CM | POA: Insufficient documentation

## 2018-11-21 DIAGNOSIS — E785 Hyperlipidemia, unspecified: Secondary | ICD-10-CM | POA: Diagnosis not present

## 2018-11-21 DIAGNOSIS — I130 Hypertensive heart and chronic kidney disease with heart failure and stage 1 through stage 4 chronic kidney disease, or unspecified chronic kidney disease: Secondary | ICD-10-CM | POA: Insufficient documentation

## 2018-11-21 DIAGNOSIS — I480 Paroxysmal atrial fibrillation: Secondary | ICD-10-CM | POA: Diagnosis not present

## 2018-11-21 DIAGNOSIS — Z7952 Long term (current) use of systemic steroids: Secondary | ICD-10-CM | POA: Diagnosis not present

## 2018-11-21 DIAGNOSIS — Z87891 Personal history of nicotine dependence: Secondary | ICD-10-CM | POA: Diagnosis not present

## 2018-11-21 DIAGNOSIS — I2510 Atherosclerotic heart disease of native coronary artery without angina pectoris: Secondary | ICD-10-CM | POA: Insufficient documentation

## 2018-11-21 DIAGNOSIS — Z7901 Long term (current) use of anticoagulants: Secondary | ICD-10-CM | POA: Insufficient documentation

## 2018-11-21 DIAGNOSIS — J439 Emphysema, unspecified: Secondary | ICD-10-CM | POA: Diagnosis not present

## 2018-11-21 DIAGNOSIS — N183 Chronic kidney disease, stage 3 (moderate): Secondary | ICD-10-CM | POA: Diagnosis not present

## 2018-11-21 DIAGNOSIS — Z7982 Long term (current) use of aspirin: Secondary | ICD-10-CM | POA: Diagnosis not present

## 2018-11-21 DIAGNOSIS — E039 Hypothyroidism, unspecified: Secondary | ICD-10-CM | POA: Diagnosis not present

## 2018-11-21 DIAGNOSIS — Z950 Presence of cardiac pacemaker: Secondary | ICD-10-CM | POA: Insufficient documentation

## 2018-11-21 DIAGNOSIS — I4892 Unspecified atrial flutter: Secondary | ICD-10-CM | POA: Diagnosis not present

## 2018-11-21 DIAGNOSIS — Z79899 Other long term (current) drug therapy: Secondary | ICD-10-CM | POA: Diagnosis not present

## 2018-11-21 DIAGNOSIS — Z794 Long term (current) use of insulin: Secondary | ICD-10-CM | POA: Insufficient documentation

## 2018-11-21 DIAGNOSIS — J84112 Idiopathic pulmonary fibrosis: Secondary | ICD-10-CM

## 2018-11-21 DIAGNOSIS — Z006 Encounter for examination for normal comparison and control in clinical research program: Secondary | ICD-10-CM | POA: Diagnosis not present

## 2018-11-21 DIAGNOSIS — E1122 Type 2 diabetes mellitus with diabetic chronic kidney disease: Secondary | ICD-10-CM | POA: Insufficient documentation

## 2018-11-21 DIAGNOSIS — I5031 Acute diastolic (congestive) heart failure: Secondary | ICD-10-CM | POA: Insufficient documentation

## 2018-11-21 DIAGNOSIS — Z792 Long term (current) use of antibiotics: Secondary | ICD-10-CM | POA: Diagnosis not present

## 2018-11-21 DIAGNOSIS — I4891 Unspecified atrial fibrillation: Secondary | ICD-10-CM | POA: Diagnosis not present

## 2018-11-21 LAB — CUP PACEART INCLINIC DEVICE CHECK
Implantable Lead Implant Date: 20200106
Implantable Lead Implant Date: 20200106
Implantable Lead Location: 753859
Implantable Lead Location: 753860
Implantable Lead Model: 3830
Implantable Pulse Generator Implant Date: 20200106
MDC IDC SESS DTM: 20200130090616

## 2018-11-21 MED ORDER — PREDNISONE 10 MG PO TABS: 60.0000 mg | ORAL_TABLET | Freq: Every day | ORAL | 0 refills | Status: DC

## 2018-11-21 MED ORDER — STUDY - FIBROGEN - PAMREVLUMAB OR PLACEBO 10 MG/ML IV INFUSION (PI-RAMASWAMY)
30.0000 mg/kg | Freq: Once | INTRAVENOUS | Status: AC
  Administered 2018-11-21: 3290 mg via INTRAVENOUS
  Filled 2018-11-21: qty 329

## 2018-11-21 NOTE — Research (Signed)
Late Entry  Title: FGCL-3019-091 (FibroGen Study) is a Phase 3, randomized, double-blind, placebo-controlled multicenter international study to evaluate evaluate the efficacy and safety of 30 mg/kg IV infusions of pamrevlumab administered every 3 weeks for 52 weeks as compared to placebo in subjects with Idiopathic Pulmonary Fibrosis. Primary end point is: change in FVC from baseline at week 52  Protocol #: FGCL-3019-091, Clinical Trials #: [id number] Sponsor: [email or web address]  [facility] [place], [place], [place])  Protocol Version for 11/21/2018 date Amendment 2.0 25SEP2019 Consent Version for 11/21/2018 date Advarra approved version 29oct2019 revised 03dec2019 Investigator Brochure Version for 11/21/2018 date v17 25OCT2018  Key Features of Pamrevlumab (FG-3019) the study drug: a recombinant fully human IgG kappa monoclonal antibody that binds to CTGF and is being developed for treatment of diseases in which tissue fibrosis has a major pathogenic role. In particular, pamrevlumab appears to disrupt a CTGF autocrine loop in mesenchymal cells like myofibroblasts that reduces their recruitment of leukocytes like macrophages, mast and dendritic cells via chemokine secretion. This disruption results in collapse of the cellular crosstalk that drives tissue remodeling.     Clinical Research Coordinator / Research RN note : This visit for [name] with DOB: [birth year] on 11/21/2018 for the above protocol is Visit/Encounter week 24 and is for purpose of research . The consent for this encounter is  currently IRB approved. Subject expressed continued interest and consent in continuing as a study subject. Subject confirmed that there was  no change in contact information (e.g. address, telephone, email). Subject thanked for participation in research and contribution to science.   In this visit 11/21/2018 the subject presented in a pleasant an energetic state. The patient reported feeling "pretty good" at today's  start of visit. The subject had no complaints at initiation of the visit. However stated that he had been to the doctor recently ([id number]) and was prescribed Acute therapy of Prednisone, Z-pak, and amoxicillin. All three began that day for treatment of what was believed to be either community acquired pneumonia, or exacerbation of IPF (per note from PCP)  . Subject, per chest X-ray on jan 29th 2020 showed multi focal pneumonia. The subject reported no fever or pain at todays visit. All pre and post infusion assessments were completed as per above stated protocol. Via video conference the PI Dr. [name] spoke with the subject to assess the state of the subject. The PI after patient consult ok for infusion to continue as planned. Subject was infused over 30 minutes today with 1 hour post observation period.  Subject tolerated therapy well with no reports of any complaints. Subject was discharged from clinic and will return in 3 weeks for his week 27 visit. For further documentation on today's visit please refer to the subjects paper source binder.   RN on Duty: [name]. Pharmacy Staff : [name]   Signed by  T. [name] BS, CCRC, CPhT Clinical Research Coordinator  [place], [place] 1:33 [place] 11/21/2018

## 2018-11-21 NOTE — Progress Notes (Signed)
Device check in clinic. Base rate decreased to 60. 2 NSVT episodes, longest 16 beats.

## 2018-11-21 NOTE — Progress Notes (Signed)
Subjective:    Patient ID: Jerry Montgomery, male    DOB: 1956/10/04, 63 y.o.   MRN: [id number]  HPI  11/19/18 Patient was recently admitted to the hospital on January 6 for pacemaker placement with AV nodal ablation due to his persistent A. fib/flutter that was difficult to control.  Charge from the hospital on January 7.  Postoperative chest x-ray showed no evidence of pneumothorax.  Digoxin was discontinued.  He was continued on diltiazem 300 mg a day.    Over the last 48 hours, the patient has noticed increasing shortness of breath.  He is normally on 6 L at home.  However he was satting 80% on 6 L at home.  They have cranked his oxygen up to 8 L and he is 90% at home.  He denies any chest pain.  He denies any pleurisy.  He denies any fevers or chills.  However for 1 week he has had a runny nose and some congestion prior to this.  He did cough yesterday and had some green sputum.  On examination, he has diminished breath sounds bilaterally with inspiratory crackles in both bases which are chronic.  There are no wheezes.  There is no pitting edema in his extremities or evidence of fluid overload on exam.  At that time, my plan was: I believe the patient is having acute exacerbation of his pulmonary fibrosis likely triggered by recent upper respiratory infection.  I will obtain a chest x-ray, CBC, BNP.  Continue oxygen at home 6 to 8 L as necessary to maintain oxygen saturation greater than 90.  Begin Depo-Medrol 80 mg IM x1 now and then start prednisone 60 mg a day at home.  Add amoxicillin 1 g p.o. 3 times daily as well as a Z-Pak to cover for possible community-acquired pneumonia until the results of the chest x-ray are back given the bibasilar crackles.  Reassess in 24 to 48 hours or go to the emergency room immediately if shortness of breath worsens or he develops increased work of breathing  11/21/18 CXR revealed: IMPRESSION: 1. New multifocal pneumonia superimposed on chronic interstitial lung  disease.  Patient is clinically better.  Oxygen saturations are up to 95% now on his standard 6 L.  He states that his breathing has improved.  I suspect given the multifocal opacity seen on chest x-ray that this likely represents an underlying exacerbation of his pulmonary fibrosis rather than multifocal pneumonia.  Therefore I believe that his improvement is based on the glucocorticoids rather than his antibiotics.  He denies any fevers or chills.  BNP was slightly elevated though not high enough to suggest congestive heart failure.  Furthermore I do not appreciate any evidence of fluid overload on his exam. Clinical Support on 11/21/2018  Component Date Value Ref Range Status  . Pulse Generator Manufacturer 11/21/2018 MERM   Final  . Date Time Interrogation Session 11/21/2018 [id number]   Final  . Pulse Gen Model 11/21/2018 [id number] [place] XT DR MRI   Final  . Pulse Gen Serial Number 11/21/2018 [id number]   Final  . Clinic Name 11/21/2018 [place]   Final  . Implantable Pulse Generator Type 11/21/2018 Implantable Pulse Generator   Final  . Implantable Pulse Generator Implan* 11/21/2018 [id number]   Final  . Implantable Lead Manufacturer 11/21/2018 MERM   Final  . Implantable Lead Model 11/21/2018 3830 SelectSecure   Final  . Implantable Lead Serial Number 11/21/2018 [id number]   Final  . Implantable Lead Implant Date 11/21/2018  [id number]   Final  . Implantable Lead Location Detail 1 11/21/2018 SEPTUM   Final  . Implantable Lead Special Function 11/21/2018 BUNDLE OF HIS   Final  . Implantable Lead Location 11/21/2018 [id number]   Final  . Implantable Lead Manufacturer 11/21/2018 MERM   Final  . Implantable Lead Model 11/21/2018 5076 CapSureFix Novus MRI SureScan   Final  . Implantable Lead Serial Number 11/21/2018 [id number]   Final  . Implantable Lead Implant Date 11/21/2018 [id number]   Final  . Implantable Lead Location Detail 1 11/21/2018 APPENDAGE   Final  . Implantable Lead  Location 11/21/2018 [id number]   Final  Office Visit on 11/19/2018  Component Date Value Ref Range Status  . Brain Natriuretic Peptide 11/19/2018 242* <100 pg/mL Final   Comment: . BNP levels increase with age in the general population with the highest values seen in individuals greater than 25 years of age. Reference: J. Am. [place] Ar. Cardiol. 2002; 03:009-233. .   . WBC 11/19/2018 10.6  3.8 - 10.8 Thousand/uL Final  . RBC 11/19/2018 4.91  4.20 - 5.80 Million/uL Final  . Hemoglobin 11/19/2018 14.1  13.2 - 17.1 g/dL Final  . HCT 11/19/2018 41.8  38.5 - 50.0 % Final  . MCV 11/19/2018 85.1  80.0 - 100.0 fL Final  . MCH 11/19/2018 28.7  27.0 - 33.0 pg Final  . MCHC 11/19/2018 33.7  32.0 - 36.0 g/dL Final  . RDW 11/19/2018 14.1  11.0 - 15.0 % Final  . Platelets 11/19/2018 216  140 - 400 Thousand/uL Final  . MPV 11/19/2018 10.2  7.5 - 12.5 fL Final  . Neutro Abs 11/19/2018 7,961* 1,500 - 7,800 cells/uL Final  . Lymphs Abs 11/19/2018 1,346  850 - 3,900 cells/uL Final  . Absolute Monocytes 11/19/2018 1,028* 200 - 950 cells/uL Final  . Eosinophils Absolute 11/19/2018 212  15 - 500 cells/uL Final  . Basophils Absolute 11/19/2018 53  0 - 200 cells/uL Final  . Neutrophils Relative % 11/19/2018 75.1  % Final  . Total Lymphocyte 11/19/2018 12.7  % Final  . Monocytes Relative 11/19/2018 9.7  % Final  . Eosinophils Relative 11/19/2018 2.0  % Final  . Basophils Relative 11/19/2018 0.5  % Final  . Glucose, Bld 11/19/2018 142* 65 - 99 mg/dL Final   Comment: .            Fasting reference interval . For someone without known diabetes, a glucose value >125 mg/dL indicates that they may have diabetes and this should be confirmed with a follow-up test. .   . BUN 11/19/2018 20  7 - 25 mg/dL Final  . Creat 11/19/2018 1.22  0.70 - 1.25 mg/dL Final   Comment: For patients >73 years of age, the reference limit for Creatinine is approximately 13% higher for people identified as African-American. .   .  GFR, Est Non African American 11/19/2018 62  > OR = 60 mL/min/1.60m Final  . GFR, Est African American 11/19/2018 72  > OR = 60 mL/min/1.732mFinal  . BUN/Creatinine Ratio 0100/76/2263OT APPLICABLE  6 - 22 (calc) Final  . Sodium 11/19/2018 140  135 - 146 mmol/L Final  . Potassium 11/19/2018 3.7  3.5 - 5.3 mmol/L Final  . Chloride 11/19/2018 103  98 - 110 mmol/L Final  . CO2 11/19/2018 26  20 - 32 mmol/L Final  . Calcium 11/19/2018 9.2  8.6 - 10.3 mg/dL Final   Findings would support acute exacerbation of pulmonary fibrosis as most likely explanation.  Past Medical  History:  Diagnosis Date  . Acute diastolic heart failure ([place])   . Atrial fibrillation (H[facility])    vs flutter  . Atrial flutter ([place])   . Atrial tachycardia ([place])   . CAD (coronary artery disease)    a. PCI of prox LAD and Cx in 2004. b. PCI to Cx 2008. c. PCI to ISR of Cx in 2011.  . Cardiomyopathy ([place])    a. EF 45-50% in 06/2018.  Marland Kitchen Chronic respiratory failure with hypoxia, on home O2 therapy ([place])   . CKD (chronic kidney disease), stage III ([place])   . COPD (chronic obstructive pulmonary disease) ([place])   . Emphysematous bleb ([place])    a.  s/p pleurectomy/pleurocentesis 2011  . Hyperlipidemia   . Hypertension   . Hypothyroidism   . Idiopathic pulmonary fibrosis ([place])   . On home oxygen therapy    "3L; 24/7" (04/15/2015)  . Pneumothorax on right 4/11  . Postinflammatory pulmonary fibrosis ([place])   . Sleep apnea    "won't use CPAP" (04/14/2105)  . SVT (supraventricular tachycardia) ([place])    Per Dr. Tanna Furry note  . Type II diabetes mellitus ([place])   . WPW (Wolff-Parkinson-White syndrome)    Per Dr. Tanna Furry Note   Past Surgical History:  Procedure Laterality Date  . AV NODE ABLATION N/A 10/28/2018   Procedure: AV NODE ABLATION;  Surgeon: Evans Lance, MD;  Location: [facility];  Service: Cardiovascular;  Laterality: N/A;  . BILATERAL VATS ABLATION Right   . CARDIAC CATHETERIZATION  05/03/2007   patent stents    . CARDIAC CATHETERIZATION N/A 05/18/2015   Procedure: Right Heart Cath;  Surgeon: Belva Crome, MD;  Location: [facility];  Service: Cardiovascular;  Laterality: N/A;  . CORONARY ANGIOPLASTY WITH STENT PLACEMENT  03/05/2003; 2008; 01/13/2010   PCI & Stent  LAD & mid CX; stent to CX  (I've got 4 stents total" (04/15/2015)  . ELECTROPHYSIOLOGIC STUDY N/A 04/14/2015   Procedure: SVT Ablation;  Surgeon: Evans Lance, MD;  Location: [facility];  Service: Cardiovascular;  Laterality: N/A;  . KNEE ARTHROSCOPY Right 01/22/2013   Procedure: RIGHT ARTHROSCOPY KNEE WITH DEBRIDEMENT, abrasion chondroplasty of lateral tibial plateau, debridement of partial tear of ACL, menisectomy;  Surgeon: Tobi Bastos, MD;  Location: [facility];  Service: Orthopedics;  Laterality: Right;  . KNEE SURGERY  2012   "reattached quad"  . LUNG LOBECTOMY Right   . PACEMAKER IMPLANT N/A 10/28/2018   Procedure: PACEMAKER IMPLANT;  Surgeon: Evans Lance, MD;  Location: [facility];  Service: Cardiovascular;  Laterality: N/A;  . SHOULDER ARTHROSCOPY W/ ROTATOR CUFF REPAIR Bilateral   . VIDEO ASSISTED THORACOSCOPY Left 07/19/2015   Procedure: LEFT VIDEO ASSISTED THORACOSCOPY WITH LEFT UPPER AND LOWER LUNG BIOPSY;  Surgeon: Melrose Nakayama, MD;  Location: [facility];  Service: Thoracic;  Laterality: Left;  Marland Kitchen VIDEO BRONCHOSCOPY N/A 07/19/2015   Procedure: VIDEO BRONCHOSCOPY WITH FLUORO;  Surgeon: Melrose Nakayama, MD;  Location:  OR;  Service: Thoracic;  Laterality: N/A;   Current Outpatient Medications on File Prior to Visit  Medication Sig Dispense Refill  . ALPRAZolam (XANAX) 0.5 MG tablet Take 1 tablet (0.5 mg total) by mouth at bedtime as needed for anxiety. 30 tablet 0  . amoxicillin (AMOXIL) 500 MG capsule Take 2 capsules (1,000 mg total) by mouth 3 (three) times daily. 42 capsule 0  . apixaban (ELIQUIS) 5 MG TABS tablet Take 1 tablet (5 mg total) by mouth  2 (two) times daily. 60 tablet 3  . aspirin  81 MG tablet Take 81 mg by mouth every morning.     Marland Kitchen atorvastatin (LIPITOR) 40 MG tablet Take 1 tablet by mouth every night at bedtime 90 tablet 1  . azithromycin (ZITHROMAX) 250 MG tablet 2 tabs poqday1, 1 tab poqday 2-5 6 tablet 0  . Blood Glucose Monitoring Suppl (ONE TOUCH ULTRA 2) w/Device KIT Use to monitor FSBS 4x daily for fluctuating blood sugars. Dx: E11.65. (Patient taking differently: 1 each See admin instructions. Use to monitor FSBS 4x daily for fluctuating blood sugars. Dx: E11.65) 1 each 0  . Cholecalciferol (VITAMIN D-3 PO) Take 1 capsule by mouth daily.    . Cyanocobalamin (VITAMIN B-12 PO) Take 1 tablet by mouth daily.    . dapagliflozin propanediol (FARXIGA) 10 MG TABS tablet Take 10 mg by mouth daily. 30 tablet 0  . diltiazem (CARDIZEM CD) 300 MG 24 hr capsule Take 1 capsule (300 mg total) by mouth daily. 30 capsule 6  . furosemide (LASIX) 20 MG tablet Take 1 tablet (20 mg total) by mouth daily as needed for fluid (fluid). (Patient taking differently: Take 20 mg by mouth daily. ) 90 tablet 2  . glucose blood (ONE TOUCH ULTRA TEST) test strip Use to monitor FSBS 4x daily for fluctuating blood sugars. Dx: E11.65. 400 each 2  . insulin aspart (NOVOLOG) 100 UNIT/ML injection 15 UNITS WITH BREAKFAST AND 15 UNITS WITH LUNCH, 20 UNITS WITH SUPPER (Patient taking differently: Inject 15-30 Units into the skin See admin instructions. Inject 15 units into the skin after breakfast, 15 units after lunch, and 30 units after supper) 60 mL 5  . Investigational - Study Medication "Title: FGCL-3019-091 (FibroGen Study) is a Phase 3, randomized, double-blind, placebo-controlled multicenter international study to evaluate evaluate the efficacy and safety of 30 mg/kg IV infusions of pamrevlumab administered every 3 weeks for 52 weeks as compared to placebo in subjects with Idiopathic Pulmonary Fibrosis. Primary end point is: change in FVC from baseline at week 52"    . LANTUS 100 UNIT/ML injection  INJECT 0.6 MLS (60 UNITS TOTAL) INTO THE SKIN AT BEDTIME. (Patient taking differently: Inject 60 Units into the skin at bedtime. ) 60 mL 3  . levothyroxine (SYNTHROID, LEVOTHROID) 125 MCG tablet Take 1 tablet (125 mcg total) by mouth daily. 90 tablet 2  . MAGNESIUM PO Take 1 tablet by mouth daily.    . Menthol, Topical Analgesic, (BIOFREEZE EX) Apply 1 application topically as needed (for pain).    . metFORMIN (GLUCOPHAGE) 850 MG tablet Take 1 tablet by mouth twice a day (Patient taking differently: Take 850 mg by mouth 2 (two) times daily with a meal. ) 180 tablet 2  . nitroGLYCERIN (NITROSTAT) 0.4 MG SL tablet Place 1 tablet (0.4 mg total) under the tongue every 5 (five) minutes as needed for chest pain. 25 tablet 1  . omega-3 acid ethyl esters (LOVAZA) 1 g capsule Take 2 capsules (2 g total) by mouth 2 (two) times daily. 360 capsule 3  . ONETOUCH DELICA LANCETS FINE MISC Test up to 4 times a day as directed. (Patient taking differently: 1 each See admin instructions. Test up to 4 times a day as directed) 100 each 5  . OXYGEN Inhale 6-8 L into the lungs continuous.    Marland Kitchen POTASSIUM PO Take 1 tablet by mouth daily.    . predniSONE (DELTASONE) 20 MG tablet Take 3 tablets (60 mg total) by mouth daily with breakfast.  21 tablet 0  . SYRINGE/NEEDLE, DISP, 1 ML (B-D SYRINGE/NEEDLE 1CC/25GX5/8) 25G X 5/8" 1 ML MISC As directed 100 each 5  . zolpidem (AMBIEN) 10 MG tablet Take 1 tablet by mouth at bedtime. 90 tablet 0   No current facility-administered medications on file prior to visit.    Allergies  Allergen Reactions  . Niaspan [Niacin] Itching  . Ofev [Nintedanib] Other (See Comments)    "Lethargic and wiped out"  . Pirfenidone Other (See Comments)    Caused the patient to unexpectedly wet the bed in the middle of the night (was never a problem prior to taking this med)  . Ramipril Cough        Social History   Socioeconomic History  . Marital status: Single    Spouse name: Not on file  .  Number of children: 1  . Years of education: Not on file  . Highest education level: Not on file  Occupational History    Employer: [employer]  . Financial resource strain: Not hard at all  . Food insecurity:    Worry: Never true    Inability: Never true  . Transportation needs:    Medical: No    Non-medical: No  Tobacco Use  . Smoking status: Former Smoker    Packs/day: 1.50    Years: 35.00    Pack years: 52.50    Types: Cigarettes    Last attempt to quit: 10/24/2007    Years since quitting: 11.0  . Smokeless tobacco: Never Used  Substance and Sexual Activity  . Alcohol use: Yes    Alcohol/week: 0.0 standard drinks    Comment: 04/15/2015 "I've had a couple drinks in the last 8 months"  . Drug use: No  . Sexual activity: Not Currently  Lifestyle  . Physical activity:    Days per week: 0 days    Minutes per session: 0 min  . Stress: Not at all  Relationships  . Social connections:    Talks on phone: Once a week    Gets together: More than three times a week    Attends religious service: Never    Active member of club or organization: No    Attends meetings of clubs or organizations: Never    Relationship status: Not on file  . Intimate partner violence:    Fear of current or ex partner: Not on file    Emotionally abused: Not on file    Physically abused: Not on file    Forced sexual activity: Not on file  Other Topics Concern  . Not on file  Social History Narrative  . Not on file       Review of Systems  All other systems reviewed and are negative.      Objective:   Physical Exam Vitals signs reviewed.  Constitutional:      General: He is not in acute distress.    Appearance: He is well-developed. He is not diaphoretic.  Neck:     Thyroid: No thyromegaly.     Vascular: No JVD.  Cardiovascular:     Rate and Rhythm: Normal rate and regular rhythm.     Pulses: Normal pulses.     Heart sounds: Normal heart sounds, S1 normal and S2  normal. No murmur. No friction rub. No gallop.   Pulmonary:     Effort: No respiratory distress.     Breath sounds: No stridor. Examination of the right-lower field reveals rales. Examination of the left-lower  field reveals rales. Decreased breath sounds and rales present. No wheezing.  Chest:     Chest wall: No tenderness.  Abdominal:     General: Bowel sounds are normal.     Palpations: Abdomen is soft.      Assessment & Plan:  Acute exacerbation of pulmonary fibrosis  He has responded to glucocorticoid therapy.  Current recommendations are to wean patient's down slowly over a period of weeks.  Therefore I recommended that he begin tapering down on prednisone.  Decrease to 50 mg a day for 4 days, then 40 mg a day for 4 days, then 30 mg a day for 4 days, then 20 mg a day for 4 days, then 10 mg a day for 4 days, then discontinue.  This will complete a 3-week taper gradually off the prednisone as recommended.  Recheck immediately if symptoms worsen or if symptoms change in any way.

## 2018-11-28 ENCOUNTER — Telehealth: Payer: Self-pay

## 2018-11-28 NOTE — Telephone Encounter (Signed)
PI note  Agreed   SIGNATURE    Dr. Kalman Shan, M.D., F.C.C.P, ACRP-CPI Pulmonary and Critical Care Medicine Research Investigator, [facility] Health Staff Physician, [facility] Center Director - Interstitial Lung Disease  Program  Pulmonary Fibrosis [facility] [place] [facility] and [facility] @ [facility] [place], [place], [address]  Pager: [phone], If no answer or between  15:00h - 7:00h: call [phone] Telephone: [phone]  11:35 AM 11/28/2018

## 2018-11-28 NOTE — Telephone Encounter (Signed)
Placed a call to Mr. Zollinger today [id number] to inform him that we received the central lab results from his last visit. I informed him that his magnesium level was still running low and that I had reviewed this with Dr. [name] and his recommendation was that Mr. Mccarroll double his current does of Magnesium until further notice. I informed him that we would recheck his levels in the coming weeks after this therapy has had time to work. The patient verified understanding and I thanked him for his time and asked that he call with any questions or concerns.   Malena Edman.

## 2018-12-04 ENCOUNTER — Other Ambulatory Visit: Payer: Self-pay | Admitting: Family Medicine

## 2018-12-04 NOTE — Telephone Encounter (Signed)
Requesting refill    Ambien     LOV: 11/21/18  LRF:  09/03/18

## 2018-12-09 ENCOUNTER — Other Ambulatory Visit: Payer: Self-pay

## 2018-12-09 DIAGNOSIS — Z006 Encounter for examination for normal comparison and control in clinical research program: Secondary | ICD-10-CM

## 2018-12-09 DIAGNOSIS — J84112 Idiopathic pulmonary fibrosis: Secondary | ICD-10-CM

## 2018-12-10 DIAGNOSIS — R269 Unspecified abnormalities of gait and mobility: Secondary | ICD-10-CM | POA: Diagnosis not present

## 2018-12-10 DIAGNOSIS — J449 Chronic obstructive pulmonary disease, unspecified: Secondary | ICD-10-CM | POA: Diagnosis not present

## 2018-12-10 DIAGNOSIS — J438 Other emphysema: Secondary | ICD-10-CM | POA: Diagnosis not present

## 2018-12-10 DIAGNOSIS — J8410 Pulmonary fibrosis, unspecified: Secondary | ICD-10-CM | POA: Diagnosis not present

## 2018-12-11 ENCOUNTER — Ambulatory Visit (INDEPENDENT_AMBULATORY_CARE_PROVIDER_SITE_OTHER)
Admission: RE | Admit: 2018-12-11 | Discharge: 2018-12-11 | Disposition: A | Discharge status: Home / Self Care | Payer: Self-pay | Source: Ambulatory Visit | Attending: Internal Medicine | Admitting: Internal Medicine

## 2018-12-11 DIAGNOSIS — J84112 Idiopathic pulmonary fibrosis: Secondary | ICD-10-CM

## 2018-12-11 DIAGNOSIS — Z006 Encounter for examination for normal comparison and control in clinical research program: Secondary | ICD-10-CM

## 2018-12-12 ENCOUNTER — Ambulatory Visit (HOSPITAL_COMMUNITY)
Admission: RE | Admit: 2018-12-12 | Discharge: 2018-12-12 | Disposition: A | Discharge status: Home / Self Care | Payer: PPO | Source: Ambulatory Visit | Attending: Internal Medicine | Admitting: Internal Medicine

## 2018-12-12 DIAGNOSIS — Z006 Encounter for examination for normal comparison and control in clinical research program: Secondary | ICD-10-CM | POA: Diagnosis not present

## 2018-12-12 DIAGNOSIS — J84112 Idiopathic pulmonary fibrosis: Secondary | ICD-10-CM | POA: Diagnosis not present

## 2018-12-12 MED ORDER — STUDY - FIBROGEN - PAMREVLUMAB OR PLACEBO 10 MG/ML IV INFUSION (PI-RAMASWAMY)
30.0000 mg/kg | Freq: Once | INTRAVENOUS | Status: AC
  Administered 2018-12-12: 3290 mg via INTRAVENOUS
  Filled 2018-12-12: qty 329

## 2018-12-12 NOTE — Research (Signed)
Title: FGCL-3019-091 (FibroGen Study) is a Phase 3, randomized, double-blind, placebo-controlled multicenter international study to evaluate evaluate the efficacy and safety of 30 mg/kg IV infusions of pamrevlumab administered every 3 weeks for 52 weeks as compared to placebo in subjects with Idiopathic Pulmonary Fibrosis. Primary end point is: change in FVC from baseline at week 52  Protocol #: FGCL-3019-091, Clinical Trials #: [id number] Sponsor: [email or web address]  [facility], [place], [place])  Protocol Version for 12/12/2018 date of  Amendment 2.0 [id number] Consent Version for 12/12/2018 date of [facility] IRB approved version 29oct2019 Investigator Brochure Version for 12/12/2018 date of v17 [id number]  Key Features of Pamrevlumab (FG-3019) the study drug: a recombinant fully human IgG kappa monoclonal antibody that binds to CTGF and is being developed for treatment of diseases in which tissue fibrosis has a major pathogenic role. In particular, pamrevlumab appears to disrupt a CTGF autocrine loop in mesenchymal cells like myofibroblasts that reduces their recruitment of leukocytes like macrophages, mast and dendritic cells via chemokine secretion. This disruption results in collapse of the cellular crosstalk that drives tissue remodeling.     Clinical Research Coordinator / Research RN note : This visit for [name] with DOB: 1956-09-12 on 12/12/2018 for the above protocol is Visit/Encounter week 27 and is for purpose of research . The consent for this encounter is currently IRB approved. The Subject expressed continued interest and consent in continuing as a study subject. Subject confirmed that there was  no change in contact information (e.g. address, telephone, email). Subject thanked for participation in research and contribution to science.   In this visit 12/12/2018 the subject will be evaluated by investigator named Dr. [name] via video conference. This research coordinator has  verified that the investigator is up to date with his training. The subject presented to clinic today reporting completion of therapy of his Amoxicillin and Z-pak (both completed 01FEB2020) and continues to take prednisone for his AE of acute exacerbation of IPF (previously reported) no other changes in any medications. The subject had his week 27 repeat HRCT per protocol on 19FEB2020. No other procedures or adverse events reported since last visit. All pre infusion assessments were completed as per required in the above stated protocol. Today's infusion was dispensed over 30 minutes and the subject was observed for 1 hour post infusion. All post infusion assessments were completed as per required by above mentioned protocol.  Subject tolerated therapy well with no complaints post infusion. Subject was discharged from clinic and will return in 3 weeks for his week 30 visit. For further documentation on today's visit please refer to the subjects paper source binder  IV line was placed at 09:01  on the patients Right Hand And remained in place throughout the entire visit and was removed at 11:53.  Supporting staff for today's visit as follows:  [name] CPhT, Pharmacy, IP prep Joesph July RN, Nurse on duty   Subject was discharged from Infusion center at 12:00 with no complaints.   Signed by  T. [name] BS, CCRC, CPhT  Clinical Research Coordinator I [place], [place] 10:20 [place] 12/12/2018

## 2018-12-20 ENCOUNTER — Telehealth: Payer: Self-pay | Admitting: *Deleted

## 2018-12-20 ENCOUNTER — Ambulatory Visit (INDEPENDENT_AMBULATORY_CARE_PROVIDER_SITE_OTHER): Payer: PPO | Admitting: Family Medicine

## 2018-12-20 ENCOUNTER — Encounter: Payer: Self-pay | Admitting: Family Medicine

## 2018-12-20 VITALS — BP 132/80 | HR 78 | Temp 98.1°F | Resp 22 | Ht 73.0 in | Wt 241.0 lb

## 2018-12-20 DIAGNOSIS — R0902 Hypoxemia: Secondary | ICD-10-CM

## 2018-12-20 DIAGNOSIS — J84112 Idiopathic pulmonary fibrosis: Secondary | ICD-10-CM | POA: Diagnosis not present

## 2018-12-20 DIAGNOSIS — R0609 Other forms of dyspnea: Secondary | ICD-10-CM

## 2018-12-20 DIAGNOSIS — R5382 Chronic fatigue, unspecified: Secondary | ICD-10-CM | POA: Diagnosis not present

## 2018-12-20 NOTE — Telephone Encounter (Signed)
Received call from patient wife, Darl Pikes.   Reports that patient has been running a fever, but has no thermometer to check. States that patient has been voicing c/o pain/ weakness in feet and legs. Also reports that patient is voicing C/O lung pain.   PCP made aware and OV scheduled for today.

## 2018-12-20 NOTE — Progress Notes (Signed)
Subjective:    Patient ID: Jerry Montgomery, male    DOB: 15-Jan-1956, 63 y.o.   MRN: [id number]  HPI  11/19/18 Patient was recently admitted to the hospital on January 6 for pacemaker placement with AV nodal ablation due to his persistent A. fib/flutter that was difficult to control.  Charge from the hospital on January 7.  Postoperative chest x-ray showed no evidence of pneumothorax.  Digoxin was discontinued.  He was continued on diltiazem 300 mg a day.    Over the last 48 hours, the patient has noticed increasing shortness of breath.  He is normally on 6 L at home.  However he was satting 80% on 6 L at home.  They have cranked his oxygen up to 8 L and he is 90% at home.  He denies any chest pain.  He denies any pleurisy.  He denies any fevers or chills.  However for 1 week he has had a runny nose and some congestion prior to this.  He did cough yesterday and had some green sputum.  On examination, he has diminished breath sounds bilaterally with inspiratory crackles in both bases which are chronic.  There are no wheezes.  There is no pitting edema in his extremities or evidence of fluid overload on exam.  At that time, my plan was: I believe the patient is having acute exacerbation of his pulmonary fibrosis likely triggered by recent upper respiratory infection.  I will obtain a chest x-ray, CBC, BNP.  Continue oxygen at home 6 to 8 L as necessary to maintain oxygen saturation greater than 90.  Begin Depo-Medrol 80 mg IM x1 now and then start prednisone 60 mg a day at home.  Add amoxicillin 1 g p.o. 3 times daily as well as a Z-Pak to cover for possible community-acquired pneumonia until the results of the chest x-ray are back given the bibasilar crackles.  Reassess in 24 to 48 hours or go to the emergency room immediately if shortness of breath worsens or he develops increased work of breathing  11/21/18 CXR revealed: IMPRESSION: 1. New multifocal pneumonia superimposed on chronic interstitial lung  disease.  Patient is clinically better.  Oxygen saturations are up to 95% now on his standard 6 L.  He states that his breathing has improved.  I suspect given the multifocal opacity seen on chest x-ray that this likely represents an underlying exacerbation of his pulmonary fibrosis rather than multifocal pneumonia.  Therefore I believe that his improvement is based on the glucocorticoids rather than his antibiotics.  He denies any fevers or chills.  BNP was slightly elevated though not high enough to suggest congestive heart failure.  Furthermore I do not appreciate any evidence of fluid overload on his exam.  Findings would support acute exacerbation of pulmonary fibrosis as most likely explanation.  At that time, my plan was:  He has responded to glucocorticoid therapy.  Current recommendations are to wean patient's down slowly over a period of weeks.  Therefore I recommended that he begin tapering down on prednisone.  Decrease to 50 mg a day for 4 days, then 40 mg a day for 4 days, then 30 mg a day for 4 days, then 20 mg a day for 4 days, then 10 mg a day for 4 days, then discontinue.  This will complete a 3-week taper gradually off the prednisone as recommended.  Recheck immediately if symptoms worsen or if symptoms change in any way.  12/20/18 Patient states that he is no  better from his last visit.  He is seen no improvement in his shortness of breath.  He is weaned down to 30 mg a day of prednisone however he states that he saw no improvement even on the higher doses of prednisone.  He still reports a cough productive of brown sputum.  He also reports dyspnea on exertion and tightness in the right side of his chest and also in the right upper back.  He denies any fevers or chills.  He denies any hemoptysis.  He denies any pleurisy.  He denies any angina.  He denies any fluid retention.  There is minimal pitting edema in both ankles however his weight is unchanged from his last visit and there is no  evidence of fluid overload on exam.  Remarkably his pulmonary exam is actually relatively clear.  There are no significant inspiratory crackles I can appreciate on exam.  His lungs actually sound better than last time however he does have diminished breath sounds bilaterally.  Patient is not currently on any type of inhaler for his COPD.  This was discontinued in the past when he was diagnosed with pulmonary fibrosis.  Peak flows were obtained.  Peak flow was 500, 500, 525 L/min.  He was then given a DuoNeb/2.5 mg of albuterol and 0.5 mg of Atrovent/nebulizer treatment.  Peak flow afterward was 500, 700, 575 L/min.   Past Medical History:  Diagnosis Date  . Acute diastolic heart failure ([place])   . Atrial fibrillation (HCC)    vs flutter  . Atrial flutter ([place])   . Atrial tachycardia ([place])   . CAD (coronary artery disease)    a. PCI of prox LAD and Cx in 2004. b. PCI to Cx 2008. c. PCI to ISR of Cx in 2011.  . Cardiomyopathy ([place])    a. EF 45-50% in 06/2018.  Marland Kitchen Chronic respiratory failure with hypoxia, on home O2 therapy ([place])   . CKD (chronic kidney disease), stage III ([place])   . COPD (chronic obstructive pulmonary disease) ([place])   . Emphysematous bleb ([place])    a.  s/p pleurectomy/pleurocentesis 2011  . Hyperlipidemia   . Hypertension   . Hypothyroidism   . Idiopathic pulmonary fibrosis ([place])   . On home oxygen therapy    "3L; 24/7" (04/15/2015)  . Pneumothorax on right 4/11  . Postinflammatory pulmonary fibrosis ([place])   . Sleep apnea    "won't use CPAP" (04/14/2105)  . SVT (supraventricular tachycardia) ([place])    Per Dr. Tanna Furry note  . Type II diabetes mellitus ([place])   . WPW (Wolff-Parkinson-White syndrome)    Per Dr. Tanna Furry Note   Past Surgical History:  Procedure Laterality Date  . AV NODE ABLATION N/A 10/28/2018   Procedure: AV NODE ABLATION;  Surgeon: Evans Lance, MD;  Location: [facility];  Service: Cardiovascular;  Laterality: N/A;  . BILATERAL VATS ABLATION  Right   . CARDIAC CATHETERIZATION  05/03/2007   patent stents  . CARDIAC CATHETERIZATION N/A 05/18/2015   Procedure: Right Heart Cath;  Surgeon: Belva Crome, MD;  Location: [facility];  Service: Cardiovascular;  Laterality: N/A;  . CORONARY ANGIOPLASTY WITH STENT PLACEMENT  03/05/2003; 2008; 01/13/2010   PCI & Stent  LAD & mid CX; stent to CX  (I've got 4 stents total" (04/15/2015)  . ELECTROPHYSIOLOGIC STUDY N/A 04/14/2015   Procedure: SVT Ablation;  Surgeon: Evans Lance, MD;  Location: [facility];  Service: Cardiovascular;  Laterality: N/A;  .  KNEE ARTHROSCOPY Right 01/22/2013   Procedure: RIGHT ARTHROSCOPY KNEE WITH DEBRIDEMENT, abrasion chondroplasty of lateral tibial plateau, debridement of partial tear of ACL, menisectomy;  Surgeon: Tobi Bastos, MD;  Location: [facility];  Service: Orthopedics;  Laterality: Right;  . KNEE SURGERY  2012   "reattached quad"  . LUNG LOBECTOMY Right   . PACEMAKER IMPLANT N/A 10/28/2018   Procedure: PACEMAKER IMPLANT;  Surgeon: Evans Lance, MD;  Location: [facility];  Service: Cardiovascular;  Laterality: N/A;  . SHOULDER ARTHROSCOPY W/ ROTATOR CUFF REPAIR Bilateral   . VIDEO ASSISTED THORACOSCOPY Left 07/19/2015   Procedure: LEFT VIDEO ASSISTED THORACOSCOPY WITH LEFT UPPER AND LOWER LUNG BIOPSY;  Surgeon: Melrose Nakayama, MD;  Location: [facility];  Service: Thoracic;  Laterality: Left;  Marland Kitchen VIDEO BRONCHOSCOPY N/A 07/19/2015   Procedure: VIDEO BRONCHOSCOPY WITH FLUORO;  Surgeon: Melrose Nakayama, MD;  Location: [facility];  Service: Thoracic;  Laterality: N/A;   Current Outpatient Medications on File Prior to Visit  Medication Sig Dispense Refill  . ALPRAZolam (XANAX) 0.5 MG tablet Take 1 tablet (0.5 mg total) by mouth at bedtime as needed for anxiety. 30 tablet 0  . amoxicillin (AMOXIL) 500 MG capsule Take 2 capsules (1,000 mg total) by mouth 3 (three) times daily. 42 capsule 0  . apixaban (ELIQUIS) 5 MG TABS tablet Take 1 tablet (5 mg  total) by mouth 2 (two) times daily. 60 tablet 3  . aspirin 81 MG tablet Take 81 mg by mouth every morning.     Marland Kitchen atorvastatin (LIPITOR) 40 MG tablet Take 1 tablet by mouth every night at bedtime 90 tablet 1  . azithromycin (ZITHROMAX) 250 MG tablet 2 tabs poqday1, 1 tab poqday 2-5 6 tablet 0  . Blood Glucose Monitoring Suppl (ONE TOUCH ULTRA 2) w/Device KIT Use to monitor FSBS 4x daily for fluctuating blood sugars. Dx: E11.65. (Patient taking differently: 1 each See admin instructions. Use to monitor FSBS 4x daily for fluctuating blood sugars. Dx: E11.65) 1 each 0  . Cholecalciferol (VITAMIN D-3 PO) Take 1 capsule by mouth daily.    . Cyanocobalamin (VITAMIN B-12 PO) Take 1 tablet by mouth daily.    . dapagliflozin propanediol (FARXIGA) 10 MG TABS tablet Take 10 mg by mouth daily. 30 tablet 0  . diltiazem (CARDIZEM CD) 300 MG 24 hr capsule Take 1 capsule (300 mg total) by mouth daily. 30 capsule 6  . furosemide (LASIX) 20 MG tablet Take 1 tablet (20 mg total) by mouth daily as needed for fluid (fluid). (Patient taking differently: Take 20 mg by mouth daily. ) 90 tablet 2  . glucose blood (ONE TOUCH ULTRA TEST) test strip Use to monitor FSBS 4x daily for fluctuating blood sugars. Dx: E11.65. 400 each 2  . insulin aspart (NOVOLOG) 100 UNIT/ML injection 15 UNITS WITH BREAKFAST AND 15 UNITS WITH LUNCH, 20 UNITS WITH SUPPER (Patient taking differently: Inject 15-30 Units into the skin See admin instructions. Inject 15 units into the skin after breakfast, 15 units after lunch, and 30 units after supper) 60 mL 5  . Investigational - Study Medication "Title: FGCL-3019-091 (FibroGen Study) is a Phase 3, randomized, double-blind, placebo-controlled multicenter international study to evaluate evaluate the efficacy and safety of 30 mg/kg IV infusions of pamrevlumab administered every 3 weeks for 52 weeks as compared to placebo in subjects with Idiopathic Pulmonary Fibrosis. Primary end point is: change in FVC from  baseline at week 52"    . LANTUS 100 UNIT/ML injection INJECT 0.6  MLS (60 UNITS TOTAL) INTO THE SKIN AT BEDTIME. (Patient taking differently: Inject 60 Units into the skin at bedtime. ) 60 mL 3  . levothyroxine (SYNTHROID, LEVOTHROID) 125 MCG tablet Take 1 tablet (125 mcg total) by mouth daily. 90 tablet 2  . MAGNESIUM PO Take 1 tablet by mouth daily.    . Menthol, Topical Analgesic, (BIOFREEZE EX) Apply 1 application topically as needed (for pain).    . metFORMIN (GLUCOPHAGE) 850 MG tablet Take 1 tablet by mouth twice a day (Patient taking differently: Take 850 mg by mouth 2 (two) times daily with a meal. ) 180 tablet 2  . nitroGLYCERIN (NITROSTAT) 0.4 MG SL tablet Place 1 tablet (0.4 mg total) under the tongue every 5 (five) minutes as needed for chest pain. 25 tablet 1  . omega-3 acid ethyl esters (LOVAZA) 1 g capsule Take 2 capsules (2 g total) by mouth 2 (two) times daily. 360 capsule 3  . ONETOUCH DELICA LANCETS FINE MISC Test up to 4 times a day as directed. (Patient taking differently: 1 each See admin instructions. Test up to 4 times a day as directed) 100 each 5  . OXYGEN Inhale 6-8 L into the lungs continuous.    Marland Kitchen POTASSIUM PO Take 1 tablet by mouth daily.    . predniSONE (DELTASONE) 10 MG tablet Take 6 tablets (60 mg total) by mouth daily with breakfast. 180 tablet 0  . predniSONE (DELTASONE) 20 MG tablet Take 3 tablets (60 mg total) by mouth daily with breakfast. 21 tablet 0  . SYRINGE/NEEDLE, DISP, 1 ML (B-D SYRINGE/NEEDLE 1CC/25GX5/8) 25G X 5/8" 1 ML MISC As directed 100 each 5  . zolpidem (AMBIEN) 10 MG tablet Take 1 tablet by mouth at bedtime. 90 tablet 0   No current facility-administered medications on file prior to visit.    Allergies  Allergen Reactions  . Niaspan [Niacin] Itching  . Ofev [Nintedanib] Other (See Comments)    "Lethargic and wiped out"  . Pirfenidone Other (See Comments)    Caused the patient to unexpectedly wet the bed in the middle of the night (was  never a problem prior to taking this med)  . Ramipril Cough        Social History   Socioeconomic History  . Marital status: Single    Spouse name: Not on file  . Number of children: 1  . Years of education: Not on file  . Highest education level: Not on file  Occupational History    Employer: [employer]  . Financial resource strain: Not hard at all  . Food insecurity:    Worry: Never true    Inability: Never true  . Transportation needs:    Medical: No    Non-medical: No  Tobacco Use  . Smoking status: Former Smoker    Packs/day: 1.50    Years: 35.00    Pack years: 52.50    Types: Cigarettes    Last attempt to quit: 10/24/2007    Years since quitting: 11.1  . Smokeless tobacco: Never Used  Substance and Sexual Activity  . Alcohol use: Yes    Alcohol/week: 0.0 standard drinks    Comment: 04/15/2015 "I've had a couple drinks in the last 8 months"  . Drug use: No  . Sexual activity: Not Currently  Lifestyle  . Physical activity:    Days per week: 0 days    Minutes per session: 0 min  . Stress: Not at all  Relationships  . Social  connections:    Talks on phone: Once a week    Gets together: More than three times a week    Attends religious service: Never    Active member of club or organization: No    Attends meetings of clubs or organizations: Never    Relationship status: Not on file  . Intimate partner violence:    Fear of current or ex partner: Not on file    Emotionally abused: Not on file    Physically abused: Not on file    Forced sexual activity: Not on file  Other Topics Concern  . Not on file  Social History Narrative  . Not on file       Review of Systems  All other systems reviewed and are negative.      Objective:   Physical Exam Vitals signs reviewed.  Constitutional:      General: He is not in acute distress.    Appearance: He is well-developed. He is not diaphoretic.  Neck:     Thyroid: No thyromegaly.      Vascular: No JVD.  Cardiovascular:     Rate and Rhythm: Normal rate and regular rhythm.     Pulses: Normal pulses.     Heart sounds: Normal heart sounds, S1 normal and S2 normal. No murmur. No friction rub. No gallop.   Pulmonary:     Effort: No respiratory distress.     Breath sounds: No stridor. Decreased breath sounds present. No wheezing or rales.  Chest:     Chest wall: No tenderness.  Abdominal:     General: Bowel sounds are normal.     Palpations: Abdomen is soft.      Assessment & Plan:  Acute idiopathic pulmonary fibrosis (HCC)  Hypoxia  DOE (dyspnea on exertion)  Chronic fatigue - Plan: COMPLETE METABOLIC PANEL WITH GFR, Magnesium, Vitamin B12, Testosterone Total,Free,Bio, Males  Patient's peak flows improved after the DuoNeb although subjectively he saw no significant change.  I have assumed that his fatigue is due to dyspnea on exertion and shortness of breath particular given the changes found on his recent x-ray.  However there appears to be a reversible component with bronchodilator therapy and he does have a diagnosis of COPD in the past.  Therefore I am want to start the patient on samples of Stiolto, 2 inhalations once daily and reassess the patient in 2 weeks to see if this is helping his breathing and also helping his fatigue by helping his breathing.  I will asked the patient to discontinue prednisone as he has seen no benefit from it and it is only exacerbating his glycemic control.  I have not checked for other potential causes of fatigue.  The patient states that he just has no energy and I have assumed it is due to the issues with his heart and his pulmonary fibrosis however at this point I will also check for vitamin B12 deficiency as well as hypergonadism.  His thyroid was checked in the end of December and was normal.  He had a CBC at the end of January that was normal and therefore I do not think there are thyroid issues or anemia at play.  Reassess the patient  in 2 weeks.  If no better, I will consult his pulmonologist to see if there are any other options for therapy.

## 2018-12-23 LAB — COMPLETE METABOLIC PANEL WITH GFR
AG Ratio: 1.8 (calc) (ref 1.0–2.5)
ALKALINE PHOSPHATASE (APISO): 84 U/L (ref 35–144)
ALT: 42 U/L (ref 9–46)
AST: 23 U/L (ref 10–35)
Albumin: 4 g/dL (ref 3.6–5.1)
BILIRUBIN TOTAL: 0.9 mg/dL (ref 0.2–1.2)
BUN/Creatinine Ratio: 18 (calc) (ref 6–22)
BUN: 23 mg/dL (ref 7–25)
CHLORIDE: 105 mmol/L (ref 98–110)
CO2: 26 mmol/L (ref 20–32)
Calcium: 8.5 mg/dL — ABNORMAL LOW (ref 8.6–10.3)
Creat: 1.31 mg/dL — ABNORMAL HIGH (ref 0.70–1.25)
GFR, Est African American: 66 mL/min/{1.73_m2} (ref >=60)
GFR, Est Non African American: 57 mL/min/{1.73_m2} — ABNORMAL LOW (ref >=60)
Globulin: 2.2 g/dL (calc) (ref 1.9–3.7)
Glucose, Bld: 135 mg/dL — ABNORMAL HIGH (ref 65–99)
POTASSIUM: 4 mmol/L (ref 3.5–5.3)
SODIUM: 141 mmol/L (ref 135–146)
Total Protein: 6.2 g/dL (ref 6.1–8.1)

## 2018-12-23 LAB — TESTOSTERONE TOTAL,FREE,BIO, MALES
Albumin: 4 g/dL (ref 3.6–5.1)
Sex Hormone Binding: 38 nmol/L (ref 22–77)
Testosterone, Bioavailable: 68.7 ng/dL — ABNORMAL LOW (ref >=110.0)
Testosterone, Free: 37.3 pg/mL — ABNORMAL LOW (ref 46.0–224.0)
Testosterone: 320 ng/dL (ref 250–827)

## 2018-12-23 LAB — VITAMIN B12: Vitamin B-12: 847 pg/mL (ref 200–1100)

## 2018-12-23 LAB — MAGNESIUM: Magnesium: 1.5 mg/dL (ref 1.5–2.5)

## 2018-12-24 ENCOUNTER — Other Ambulatory Visit: Payer: Self-pay | Admitting: Family Medicine

## 2018-12-24 MED ORDER — TESTOSTERONE CYPIONATE 100 MG/ML IM SOLN: INTRAMUSCULAR | 2 refills | Status: DC

## 2018-12-24 MED ORDER — "SYRINGE/NEEDLE (DISP) 23G X 1"" 3 ML MISC": 2 refills | Status: DC

## 2018-12-24 NOTE — Telephone Encounter (Signed)
Please send Rx for testosterone

## 2018-12-26 ENCOUNTER — Ambulatory Visit (INDEPENDENT_AMBULATORY_CARE_PROVIDER_SITE_OTHER): Payer: PPO | Admitting: *Deleted

## 2018-12-26 DIAGNOSIS — E291 Testicular hypofunction: Secondary | ICD-10-CM | POA: Diagnosis not present

## 2018-12-26 MED ORDER — TESTOSTERONE CYPIONATE 100 MG/ML IM SOLN
100.0000 mg | Freq: Once | INTRAMUSCULAR | Status: AC
  Administered 2018-12-26: 100 mg via INTRAMUSCULAR

## 2018-12-26 NOTE — Progress Notes (Signed)
Patient seen in office for testosterone injection.   Tolerated IM administration well.   Educated spouse on injection procedure.

## 2019-01-02 ENCOUNTER — Encounter (HOSPITAL_COMMUNITY)
Admission: RE | Admit: 2019-01-02 | Discharge: 2019-01-02 | Disposition: A | Discharge status: Home / Self Care | Payer: PPO | Source: Ambulatory Visit | Attending: Internal Medicine | Admitting: Internal Medicine

## 2019-01-02 ENCOUNTER — Telehealth: Payer: Self-pay | Admitting: Family Medicine

## 2019-01-02 ENCOUNTER — Other Ambulatory Visit: Payer: Self-pay

## 2019-01-02 DIAGNOSIS — Z006 Encounter for examination for normal comparison and control in clinical research program: Secondary | ICD-10-CM | POA: Diagnosis not present

## 2019-01-02 DIAGNOSIS — J84112 Idiopathic pulmonary fibrosis: Secondary | ICD-10-CM

## 2019-01-02 NOTE — Research (Signed)
Title: FGCL-3019-091 (FibroGen Study) is a Phase 3, randomized, double-blind, placebo-controlled multicenter international study to evaluate evaluate the efficacy and safety of 30 mg/kg IV infusions of pamrevlumab administered every 3 weeks for 52 weeks as compared to placebo in subjects with Idiopathic Pulmonary Fibrosis. Primary end point is: change in FVC from baseline at week 52  Protocol #: FGCL-3019-091, Clinical Trials #: [id number] Sponsor: [email or web address]  [facility] [place], [place], [place])  Protocol Version for 01/02/2019 date of Amendment 2.0 25SEP2019 Consent Version for 01/02/2019 date of [facility] IRB approved version 29OCT2019 Investigator Brochure Version for 01/02/2019 date of v17 25OCT2018   Key Features of Pamrevlumab (FG-3019) the study drug: a recombinant fully human IgG kappa monoclonal antibody that binds to CTGF and is being developed for treatment of diseases in which tissue fibrosis has a major pathogenic role. In particular, pamrevlumab appears to disrupt a CTGF autocrine loop in mesenchymal cells like myofibroblasts that reduces their recruitment of leukocytes like macrophages, mast and dendritic cells via chemokine secretion. This disruption results in collapse of the cellular crosstalk that drives tissue remodeling.     Clinical Research Coordinator note : This visit for [name] with DOB: March 28, 1956 on 01/02/2019 for the above protocol is Visit/Encounter # Week 30  and is for purpose of research . The consent for this encounter is currently IRB approved. Subject expressed continued interest and consent in continuing as a study subject. Subject confirmed that there was  NO change in contact information (e.g. address, telephone, email). Subject thanked for participation in research and contribution to science.   In this visit 01/02/2019 the subject will be evaluated by investigator named Dr. [name] via video conference. This research coordinator has verified that the  investigator is up to date with his training. The subject presented to the clinic today for his scheduled treatment visit with no complaints. The subject reported no new AEs or non drug procedures. He did however report addition of Depo-Testosterone injections that he reported starting 06MAR2020 and currently continues this at one injection every 10 days. All pre infusion assessments were completed per above referenced protocol infusion was administered over 30 minutess and subject was observed for 30 minutes post infusion. For further documentation on today's visit please refer to the subjects' paper source binder.   IV line was placed at 09:58 on the patients Right Hand And remained in place throughout the entire visit and was removed at 11:13.  Supporting staff for today's visit as follows:  [name] CPhT, Pharmacy, IP prep Mayo Ao RN, Nurse on duty   Subject was discharged from Infusion center at 11:20 with no complaints. Signed by  T. [name] BS, CCRC, CPhT Clinical Research Coordinator I [place], [place] 2:91 [place] 01/02/2019

## 2019-01-02 NOTE — Telephone Encounter (Signed)
Please document who comes to pick this up

## 2019-01-02 NOTE — Telephone Encounter (Signed)
Pt notified of [facility]iga patient assistance being here for pick up. Placed into cabinet.

## 2019-01-02 NOTE — Telephone Encounter (Signed)
Zachry Hofland picked up on 01/02/2019.

## 2019-01-03 ENCOUNTER — Other Ambulatory Visit: Payer: Self-pay | Admitting: Family Medicine

## 2019-01-08 DIAGNOSIS — J449 Chronic obstructive pulmonary disease, unspecified: Secondary | ICD-10-CM | POA: Diagnosis not present

## 2019-01-08 DIAGNOSIS — R269 Unspecified abnormalities of gait and mobility: Secondary | ICD-10-CM | POA: Diagnosis not present

## 2019-01-08 DIAGNOSIS — J438 Other emphysema: Secondary | ICD-10-CM | POA: Diagnosis not present

## 2019-01-08 DIAGNOSIS — J8410 Pulmonary fibrosis, unspecified: Secondary | ICD-10-CM | POA: Diagnosis not present

## 2019-01-16 ENCOUNTER — Other Ambulatory Visit: Payer: Self-pay

## 2019-01-17 ENCOUNTER — Encounter (HOSPITAL_COMMUNITY)
Admission: RE | Admit: 2019-01-17 | Discharge: 2019-01-17 | Disposition: A | Discharge status: Home / Self Care | Payer: PPO | Source: Ambulatory Visit | Attending: Internal Medicine | Admitting: Internal Medicine

## 2019-01-17 ENCOUNTER — Encounter: Payer: Self-pay | Admitting: Internal Medicine

## 2019-01-17 DIAGNOSIS — Z006 Encounter for examination for normal comparison and control in clinical research program: Secondary | ICD-10-CM

## 2019-01-17 DIAGNOSIS — J84112 Idiopathic pulmonary fibrosis: Secondary | ICD-10-CM

## 2019-01-17 MED ORDER — STUDY - FIBROGEN - PAMREVLUMAB OR PLACEBO 10 MG/ML IV INFUSION (PI-RAMASWAMY)
30.0000 mg/kg | Freq: Once | INTRAVENOUS | Status: AC
  Administered 2019-01-17: 3290 mg via INTRAVENOUS

## 2019-01-17 NOTE — Research (Signed)
Title: FGCL-3019-091 (FibroGen Study) is a Phase 3, randomized, double-blind, placebo-controlled multicenter international study to evaluate evaluate the efficacy and safety of 30 mg/kg IV infusions of pamrevlumab administered every 3 weeks for 52 weeks as compared to placebo in subjects with Idiopathic Pulmonary Fibrosis. Primary end point is: change in FVC from baseline at week 52  Protocol #: FGCL-3019-091, Clinical Trials #: [id number] Sponsor: [email or web address]  [facility], [place], [place])  Protocol Version for 01/17/2019 date Amendment 2.0 25SEP2019 Consent Version for 01/17/2019 date [facility] IRB approved version 29OCT2019 Investigator Brochure Version for 01/17/2019 date Version 17 25OCT2018  Key Features of Pamrevlumab (FG-3019) the study drug: a recombinant fully human IgG kappa monoclonal antibody that binds to CTGF and is being developed for treatment of diseases in which tissue fibrosis has a major pathogenic role. In particular, pamrevlumab appears to disrupt a CTGF autocrine loop in mesenchymal cells like myofibroblasts that reduces their recruitment of leukocytes like macrophages, mast and dendritic cells via chemokine secretion. This disruption results in collapse of the cellular crosstalk that drives tissue remodeling.   Prior to the conduct of this visit the subject was contacted by [facility]. The coordinator conducted the pre-visit COVID-19 patient questionnaires. This was conducted via phone call on 26MAR2020. Subject passed all screening questions and visit was confirmed to occur as planned.   Clinical Research Coordinator note : This visit for [name] with DOB: 1955/12/07 on 01/17/2019 for the above protocol is Visit/Encounter # week 33  and is for purpose of research . The consent for this encounter is currently IRB approved. The Subject expressed continued interest and consent in continuing as a study subject. Subject confirmed that there was  NO change  in contact information (e.g. address, telephone, email). Subject thanked for participation in research and contribution to science.   In this visit 01/17/2019 the subject presented to clinic today for his interval study visit and treatment. The Study coordinator Malena Edman. Reviewed the subjects last couple of weeks since last visit to assess any new AE's or changes in medicines or non- drug procedures. The subject reported no new AEs, no changes to any medications and no non-drug procedures. The subject had no complaints and reported he was feeling fine. Dr. [name] was available for today's visit via video and he evaluated  The subject and felt it ok to continue with the subjects planned treatment as scheduled. This research coordinator has verified that the investigator is up to date with his training. All pre and post infusion  assessments were conducted as per above mentioned protocol requirements. The subject will return in approximately 3 weeks for his week 36 visit. For further details regarding today's visit please refer to the subjects paper source binder.  Additional details  IV line was placed at 11:27 on the patients Right HandAnd remained in place throughout the entire visit and was removed at13:19.  Infusion start time 12:19 Infusion end time 12:48  Supporting staff for today's visit as follows:  Arther Dames PharmD, Pharmacy, IP prep Mayo Ao RN, Nurse on duty   Subject was discharged from Infusion center at13:35 with no complaints  Signed by  T. [name] BS, CCRC, CPhT Clinical Research Coordinator I [place], [place] 98:11 PM 01/17/2019

## 2019-01-22 ENCOUNTER — Encounter (HOSPITAL_COMMUNITY): Payer: PPO

## 2019-01-23 ENCOUNTER — Telehealth: Payer: Self-pay | Admitting: Internal Medicine

## 2019-01-23 NOTE — Telephone Encounter (Signed)
New message   Called patient about visit on 04.08.20. Pt family will have patient return call. Need to move appt to 9am.

## 2019-01-24 NOTE — Telephone Encounter (Signed)
Pt aware of appt time change

## 2019-01-29 ENCOUNTER — Encounter: Payer: Self-pay | Admitting: Internal Medicine

## 2019-01-29 ENCOUNTER — Ambulatory Visit: Payer: PPO | Admitting: Internal Medicine

## 2019-01-29 ENCOUNTER — Other Ambulatory Visit: Payer: Self-pay

## 2019-01-29 VITALS — BP 94/60 | HR 66 | Ht 73.0 in | Wt 229.0 lb

## 2019-01-29 DIAGNOSIS — I4891 Unspecified atrial fibrillation: Secondary | ICD-10-CM | POA: Diagnosis not present

## 2019-01-29 NOTE — Progress Notes (Signed)
HPI Mr. Furio returns today for his 5 day followup after undergoing PPM insertion and AV node ablation. He is a pleasant 63 yo man with pulmonary fibrosis, atrial fib/flutter with a RVR, s/p the above. In the interim, he has done well with no heart racing. He has not been in the hospital. No edema.  Allergies  Allergen Reactions  . Niaspan [Niacin] Itching  . Ofev [Nintedanib] Other (See Comments)    "Lethargic and wiped out"  . Pirfenidone Other (See Comments)    Caused the patient to unexpectedly wet the bed in the middle of the night (was never a problem prior to taking this med)  . Ramipril Cough          Current Outpatient Medications  Medication Sig Dispense Refill  . ALPRAZolam (XANAX) 0.5 MG tablet Take 1 tablet (0.5 mg total) by mouth at bedtime as needed for anxiety. 30 tablet 0  . apixaban (ELIQUIS) 5 MG TABS tablet Take 1 tablet (5 mg total) by mouth 2 (two) times daily. 60 tablet 3  . aspirin 81 MG tablet Take 81 mg by mouth every morning.     Marland Kitchen atorvastatin (LIPITOR) 40 MG tablet Take 1 tablet by mouth every night at bedtime 90 tablet 1  . Blood Glucose Monitoring Suppl (ONE TOUCH ULTRA 2) w/Device KIT Use to monitor FSBS 4x daily for fluctuating blood sugars. Dx: E11.65. (Patient taking differently: 1 each See admin instructions. Use to monitor FSBS 4x daily for fluctuating blood sugars. Dx: E11.65) 1 each 0  . Cholecalciferol (VITAMIN D-3 PO) Take 1 capsule by mouth daily.    . Cyanocobalamin (VITAMIN B-12 PO) Take 1 tablet by mouth daily.    . dapagliflozin propanediol (FARXIGA) 10 MG TABS tablet Take 10 mg by mouth daily. 30 tablet 0  . diltiazem (CARDIZEM CD) 300 MG 24 hr capsule Take 1 capsule (300 mg total) by mouth daily. 30 capsule 6  . furosemide (LASIX) 20 MG tablet Take 1 tablet (20 mg total) by mouth daily as needed for fluid (fluid). (Patient taking differently: Take 20 mg by mouth daily. ) 90 tablet 2  . glucose blood (ONE TOUCH ULTRA TEST) test  strip Use to monitor FSBS 4x daily for fluctuating blood sugars. Dx: E11.65. 400 each 2  . insulin aspart (NOVOLOG) 100 UNIT/ML injection 15 UNITS WITH BREAKFAST AND 15 UNITS WITH LUNCH, 20 UNITS WITH SUPPER (Patient taking differently: Inject 15-30 Units into the skin See admin instructions. Inject 15 units into the skin after breakfast, 15 units after lunch, and 30 units after supper) 60 mL 5  . Investigational - Study Medication "Title: FGCL-3019-091 (FibroGen Study) is a Phase 3, randomized, double-blind, placebo-controlled multicenter international study to evaluate evaluate the efficacy and safety of 30 mg/kg IV infusions of pamrevlumab administered every 3 weeks for 52 weeks as compared to placebo in subjects with Idiopathic Pulmonary Fibrosis. Primary end point is: change in FVC from baseline at week 52"    . LANTUS 100 UNIT/ML injection INJECT 0.6 MLS (60 UNITS TOTAL) INTO THE SKIN AT BEDTIME. (Patient taking differently: Inject 60 Units into the skin at bedtime. ) 60 mL 3  . levothyroxine (SYNTHROID, LEVOTHROID) 125 MCG tablet Take 1 tablet (125 mcg total) by mouth daily. 90 tablet 2  . MAGNESIUM PO Take 1 tablet by mouth daily.    . Menthol, Topical Analgesic, (BIOFREEZE EX) Apply 1 application topically as needed (for pain).    . metFORMIN (GLUCOPHAGE) 850 MG tablet  Take 1 tablet by mouth twice a day 180 tablet 1  . nitroGLYCERIN (NITROSTAT) 0.4 MG SL tablet Place 1 tablet (0.4 mg total) under the tongue every 5 (five) minutes as needed for chest pain. 25 tablet 1  . omega-3 acid ethyl esters (LOVAZA) 1 g capsule Take 2 capsules (2 g total) by mouth 2 (two) times daily. 360 capsule 3  . ONETOUCH DELICA LANCETS FINE MISC Test up to 4 times a day as directed. (Patient taking differently: 1 each See admin instructions. Test up to 4 times a day as directed) 100 each 5  . OXYGEN Inhale 6-8 L into the lungs continuous.    Marland Kitchen POTASSIUM PO Take 1 tablet by mouth daily.    . predniSONE (DELTASONE) 10  MG tablet Take 6 tablets (60 mg total) by mouth daily with breakfast. (Patient taking differently: Take 30 mg by mouth daily with breakfast. ) 180 tablet 0  . SYRINGE-NEEDLE, DISP, 3 ML (B-D SYRINGE/NEEDLE 3CC/23GX1") 23G X 1" 3 ML MISC Use with Testosterone injections 50 each 2  . SYRINGE/NEEDLE, DISP, 1 ML (B-D SYRINGE/NEEDLE 1CC/25GX5/8) 25G X 5/8" 1 ML MISC As directed 100 each 5  . testosterone cypionate (DEPO-TESTOSTERONE) 100 MG/ML injection 100 mg injected SQ q 10 days 10 mL 2  . zolpidem (AMBIEN) 10 MG tablet Take 1 tablet by mouth at bedtime. 90 tablet 0   No current facility-administered medications for this visit.      Past Medical History:  Diagnosis Date  . Acute diastolic heart failure ([place])   . Atrial fibrillation ([facility])    vs flutter  . Atrial flutter ([place])   . Atrial tachycardia ([place])   . CAD (coronary artery disease)    a. PCI of prox LAD and Cx in 2004. b. PCI to Cx 2008. c. PCI to ISR of Cx in 2011.  . Cardiomyopathy ([place])    a. EF 45-50% in 06/2018.  Marland Kitchen Chronic respiratory failure with hypoxia, on home O2 therapy ([place])   . CKD (chronic kidney disease), stage III ([place])   . COPD (chronic obstructive pulmonary disease) ([place])   . Emphysematous bleb ([place])    a.  s/p pleurectomy/pleurocentesis 2011  . Hyperlipidemia   . Hypertension   . Hypothyroidism   . Idiopathic pulmonary fibrosis ([place])   . On home oxygen therapy    "3L; 24/7" (04/15/2015)  . Pneumothorax on right 4/11  . Postinflammatory pulmonary fibrosis ([place])   . Sleep apnea    "won't use CPAP" (04/14/2105)  . SVT (supraventricular tachycardia) ([place])    Per Dr. Tanna Furry note  . Type II diabetes mellitus ([place])   . WPW (Wolff-Parkinson-White syndrome)    Per Dr. Tanna Furry Note    ROS:   All systems reviewed and negative except as noted in the HPI.   Past Surgical History:  Procedure Laterality Date  . AV NODE ABLATION N/A 10/28/2018   Procedure: AV NODE ABLATION;  Surgeon: Evans Lance, MD;   Location: [facility];  Service: Cardiovascular;  Laterality: N/A;  . BILATERAL VATS ABLATION Right   . CARDIAC CATHETERIZATION  05/03/2007   patent stents  . CARDIAC CATHETERIZATION N/A 05/18/2015   Procedure: Right Heart Cath;  Surgeon: Belva Crome, MD;  Location: [facility];  Service: Cardiovascular;  Laterality: N/A;  . CORONARY ANGIOPLASTY WITH STENT PLACEMENT  03/05/2003; 2008; 01/13/2010   PCI & Stent  LAD & mid CX; stent to CX  (I've got 4 stents total" (04/15/2015)  .  ELECTROPHYSIOLOGIC STUDY N/A 04/14/2015   Procedure: SVT Ablation;  Surgeon: Evans Lance, MD;  Location: [facility];  Service: Cardiovascular;  Laterality: N/A;  . KNEE ARTHROSCOPY Right 01/22/2013   Procedure: RIGHT ARTHROSCOPY KNEE WITH DEBRIDEMENT, abrasion chondroplasty of lateral tibial plateau, debridement of partial tear of ACL, menisectomy;  Surgeon: Tobi Bastos, MD;  Location: [facility];  Service: Orthopedics;  Laterality: Right;  . KNEE SURGERY  2012   "reattached quad"  . LUNG LOBECTOMY Right   . PACEMAKER IMPLANT N/A 10/28/2018   Procedure: PACEMAKER IMPLANT;  Surgeon: Evans Lance, MD;  Location: [facility];  Service: Cardiovascular;  Laterality: N/A;  . SHOULDER ARTHROSCOPY W/ ROTATOR CUFF REPAIR Bilateral   . VIDEO ASSISTED THORACOSCOPY Left 07/19/2015   Procedure: LEFT VIDEO ASSISTED THORACOSCOPY WITH LEFT UPPER AND LOWER LUNG BIOPSY;  Surgeon: Melrose Nakayama, MD;  Location: [facility];  Service: Thoracic;  Laterality: Left;  Marland Kitchen VIDEO BRONCHOSCOPY N/A 07/19/2015   Procedure: VIDEO BRONCHOSCOPY WITH FLUORO;  Surgeon: Melrose Nakayama, MD;  [facility];  Service: Thoracic;  Laterality: N/A;     Family History  Problem Relation Age of Onset  . Breast cancer Mother   . Diabetes type II Mother   . Lung cancer Father      Social History   Socioeconomic History  . Marital status: Single    Spouse name: Not on file  . Number of children: 1  . Years of  education: Not on file  . Highest education level: Not on file  Occupational History    Employer: [employer]  . Financial resource strain: Not hard at all  . Food insecurity:    Worry: Never true    Inability: Never true  . Transportation needs:    Medical: No    Non-medical: No  Tobacco Use  . Smoking status: Former Smoker    Packs/day: 1.50    Years: 35.00    Pack years: 52.50    Types: Cigarettes    Last attempt to quit: 10/24/2007    Years since quitting: 11.2  . Smokeless tobacco: Never Used  Substance and Sexual Activity  . Alcohol use: Yes    Alcohol/week: 0.0 standard drinks    Comment: 04/15/2015 "I've had a couple drinks in the last 8 months"  . Drug use: No  . Sexual activity: Not Currently  Lifestyle  . Physical activity:    Days per week: 0 days    Minutes per session: 0 min  . Stress: Not at all  Relationships  . Social connections:    Talks on phone: Once a week    Gets together: More than three times a week    Attends religious service: Never    Active member of club or organization: No    Attends meetings of clubs or organizations: Never    Relationship status: Not on file  . Intimate partner violence:    Fear of current or ex partner: Not on file    Emotionally abused: Not on file    Physically abused: Not on file    Forced sexual activity: Not on file  Other Topics Concern  . Not on file  Social History Narrative  . Not on file     BP - 94/60, P - 67, R - 20, Wt. - 229 Physical Exam:  stable appearing 63 yo man, NAD HEENT: Unremarkable Neck:  No JVD, no thyromegally Lymphatics:  No adenopathy Back:  No CVA tenderness Lungs:  Scattered wheezes/rales HEART:  Regular rate rhythm, 2/6 systolic murmurs, no rubs, no clicks Abd:  soft, positive bowel sounds, no organomegally, no rebound, no guarding Ext:  2 plus pulses, no edema, no cyanosis, no clubbing Skin:  No rashes no nodules Neuro:  CN II through XII intact, motor  grossly intact  EKG - atrial fib/flutter with ventricular pacing  DEVICE  Normal device function.  See PaceArt for details.   Assess/Plan: 1. Atrial fib - his rate is now controlled, s/p AV node ablation and His bundle DDD PM.  2. PPM - interogation of his device demonstrates a pacing threshold of 0.75 at 0.4 in the V. Fib waves are 1-1.5, no R waves due to underlying CHB. 3. Pulmonary fibrosis - he is stable. He is on IV therapy still. 4. CAD - he denies anginal symptoms. He is s/p PCI remotely. Continue low dose ASA.  Mikle Bosworth.D.

## 2019-01-29 NOTE — Patient Instructions (Signed)
Medication Instructions:  Your physician recommends that you continue on your current medications as directed. Please refer to the Current Medication list given to you today.  Labwork: None ordered.  Testing/Procedures: None ordered.  Follow-Up: Your physician wants you to follow-up in: 9 months with Dr. [name].   You will receive a reminder letter in the mail two months in advance. If you don't receive a letter, please call our office to schedule the follow-up appointment.  Remote monitoring is used to monitor your Pacemaker from home. This monitoring reduces the number of office visits required to check your device to one time per year. It allows [place] to keep an eye on the functioning of your device to ensure it is working properly. You are scheduled for a device check from home on 04/30/2019. You may send your transmission at any time that day. If you have a wireless device, the transmission will be sent automatically. After your physician reviews your transmission, you will receive a postcard with your next transmission date.  Any Other Special Instructions Will Be Listed Below (If Applicable).  If you need a refill on your cardiac medications before your next appointment, please call your pharmacy.

## 2019-02-08 DIAGNOSIS — R269 Unspecified abnormalities of gait and mobility: Secondary | ICD-10-CM | POA: Diagnosis not present

## 2019-02-08 DIAGNOSIS — J438 Other emphysema: Secondary | ICD-10-CM | POA: Diagnosis not present

## 2019-02-08 DIAGNOSIS — J8410 Pulmonary fibrosis, unspecified: Secondary | ICD-10-CM | POA: Diagnosis not present

## 2019-02-08 DIAGNOSIS — J449 Chronic obstructive pulmonary disease, unspecified: Secondary | ICD-10-CM | POA: Diagnosis not present

## 2019-02-10 ENCOUNTER — Ambulatory Visit (HOSPITAL_COMMUNITY)
Admission: RE | Admit: 2019-02-10 | Discharge: 2019-02-10 | Disposition: A | Discharge status: Home / Self Care | Payer: PPO | Source: Ambulatory Visit | Attending: Internal Medicine | Admitting: Internal Medicine

## 2019-02-10 ENCOUNTER — Other Ambulatory Visit: Payer: Self-pay

## 2019-02-10 DIAGNOSIS — Z006 Encounter for examination for normal comparison and control in clinical research program: Secondary | ICD-10-CM | POA: Insufficient documentation

## 2019-02-10 DIAGNOSIS — J84112 Idiopathic pulmonary fibrosis: Secondary | ICD-10-CM

## 2019-02-10 MED ORDER — STUDY - FIBROGEN - PAMREVLUMAB OR PLACEBO 10 MG/ML IV INFUSION (PI-RAMASWAMY)
30.0000 mg/kg | Freq: Once | INTRAVENOUS | Status: AC
  Administered 2019-02-10: 3140 mg via INTRAVENOUS

## 2019-02-10 NOTE — Research (Signed)
   Clinical Research Coordinator  note : This visit for [name] with DOB: [birth year] on 02/10/2019 for the above protocol is Visit/Encounter # 36 month  and is for purpose of research . The consent for this encounter is under Protocol Version May 21, 2017  and  Is currently IRB approved. The subject expressed continued interest and consent in continuing as a study subject. Subject confirmed that there was  no change in contact information (e.g. address, telephone, email). Subject thanked for participation in research and contribution to science.   In this visit 02/10/2019 the subject will have his month 36 blood collection and patient reported outcomes collected. Both assessments were completed as per the above stated protocol without complication. The subject was thanked for his participation and was informed that his next visit would be scheduled at a later date. The subject will return to the clinic in approximately 6 months for his next study visit.   Signed by  T. [name] BS, CCRC, CPhT Clinical Research Coordinator I [place], [place] 2:37 [place] 02/10/2019

## 2019-02-10 NOTE — Research (Signed)
Title: FGCL-3019-091 (FibroGen Study) is a Phase 3, randomized, double-blind, placebo-controlled multicenter international study to evaluate evaluate the efficacy and safety of 30 mg/kg IV infusions of pamrevlumab administered every 3 weeks for 52 weeks as compared to placebo in subjects with Idiopathic Pulmonary Fibrosis. Primary end point is: change in FVC from baseline at week 52  Protocol #: FGCL-3019-091, Clinical Trials #: [id number] Sponsor: [email or web address]  ([place], [place], [place])  Protocol Version for date of  02/10/2019  is amendment 2.0 dated 17 July 2018  Consent Version for date of  02/10/2019  is   [employer] IRB Approved Version 20 Aug 2018 Revised 24 Sep 2018  Investigator Brochure Version for Date of 02/10/2019 is v17 dated 16-Aug-2017  Key Features of Pamrevlumab (FG-3019) the study drug: a recombinant fully human IgG kappa monoclonal antibody that binds to CTGF and is being developed for treatment of diseases in which tissue fibrosis has a major pathogenic role. In particular, pamrevlumab appears to disrupt a CTGF autocrine loop in mesenchymal cells like myofibroblasts that reduces their recruitment of leukocytes like macrophages, mast and dendritic cells via chemokine secretion. This disruption results in collapse of the cellular crosstalk that drives tissue remodeling.    Clinical Research Coordinator / Research RN note : This visit for [name] with DOB: July 17, 1956 on 02/10/2019 for the above protocol is Visit/Encounter # week 60 and is for purpose of Research . The consent for this encounter is currently IRB approved. The subejct expressed continued interest and consent in continuing as a study subject. Subject confirmed that there was no change in contact information (e.g. address, telephone, email). Subject thanked for participation in research and contribution to science.   In this visit 02/10/2019 the subject reported to clinic for his week 36 visit with no  complaints. The subject reports no changes in any medications nor any new non drug therapies. Dr. Chase Caller also met with the subject via video conference to review the last few weeks since last treatment. The subject had no infusion related adverse events to report and other than mentioning that his feet continue to feel cold intermitantly (previously reported) he has no updates to report. However, today the subject was weighed as per required by protocol at his 36 week visit and it was noted that the subject now weighs 104.5 kgs which is an 8.3 % decrease since baseline (114kgs). This weight loss is not intentional by the subject and has been documented as Grade 1 Weight loss AE with onset date of 20APR2020. This weight loss is not felt to be due to Investigational product. Given the subjects current state the PI Dr. Chase Caller felt it was ok to continue on with the infusion today as planned. All pre and post infusion assessments were performed as per required by the above stated protocol.  For further documentation on today's visit please refer to the subjects paper source binder.   Additional Details RN on DUTY: Jerry Montgomery personnel: Jerry Montgomery IV line site: Right wrist IV line placed: 09:17 Infusion Start time: 11:17 Infusion End time: : 11:44 IV Line Removed: :12:22 Patient left sight: :12:30     Pre infusion Vitals   Temp: 98.7  F   BP-Left arm: 103/67   Pulse: 77 bpm   RR: 22 bpm   SpO2: 98% on 6 L/min   Post infusion Vitals   Temp: 98.7 F   BP-left arm: 98/64   Pulse: 62bpm   RR: 24 bpm   Spo2: 95% on  6 L/min    Signed by  T Early Chars BS, CCRC, [place], [place] 3:09 PM 02/10/2019

## 2019-02-11 ENCOUNTER — Telehealth: Payer: Self-pay

## 2019-02-11 NOTE — Telephone Encounter (Signed)
Late entry for 17APR2020  Phone encounter with Study subject participating in both FGCL-3019-091 and IPF-PRO registry studies  Prior to the conduct of the subjects' visit planned for 20APR2020 [name] was contacted by [facility] on 17APR2020. The coordinator conducted the pre-visit COVID-19 patient questionnaires. This was conducted via phone call on 17APR2020. Subject passed all screening questions and visits were confirmed to occur as planned.   Malena Edman.- Late entry for 17APR2020

## 2019-03-03 ENCOUNTER — Other Ambulatory Visit: Payer: Self-pay

## 2019-03-03 ENCOUNTER — Ambulatory Visit (HOSPITAL_COMMUNITY)
Admission: RE | Admit: 2019-03-03 | Discharge: 2019-03-03 | Disposition: A | Discharge status: Home / Self Care | Payer: PPO | Source: Ambulatory Visit | Attending: Internal Medicine | Admitting: Internal Medicine

## 2019-03-03 DIAGNOSIS — Z006 Encounter for examination for normal comparison and control in clinical research program: Secondary | ICD-10-CM | POA: Insufficient documentation

## 2019-03-03 MED ORDER — STUDY - FIBROGEN - PAMREVLUMAB OR PLACEBO 10 MG/ML IV INFUSION (PI-RAMASWAMY)
30.0000 mg/kg | Freq: Once | INTRAVENOUS | Status: AC
  Administered 2019-03-03: 11:00:00 3140 mg via INTRAVENOUS
  Filled 2019-03-03: qty 314

## 2019-03-03 NOTE — Research (Signed)
Late Entry for 11MAY2020   Title: FGCL-3019-091 (FibroGen Study) is a Phase 3, randomized, double-blind, placebo-controlled multicenter international study to evaluate evaluate the efficacy and safety of 30 mg/kg IV infusions of pamrevlumab administered every 3 weeks for 52 weeks as compared to placebo in subjects with Idiopathic Pulmonary Fibrosis. Primary end point is: change in FVC from baseline at week 52  Protocol #: FGCL-3019-091, Clinical Trials #: [id number] Sponsor: [email or web address]  [facility]([place]  Protocol Version for date of  03/03/2019 is  Amendment 3 dated 19FEB2020 Consent Version for date of  03/03/2019  [employer] IRB Approved version 10APR2020 revised 10APR2020 Investigator Brochure Version for date of  03/03/2019 V17 dated 25OCT2018  Key Features of Pamrevlumab (FG-3019) the study drug: a recombinant fully human IgG kappa monoclonal antibody that binds to CTGF and is being developed for treatment of diseases in which tissue fibrosis has a major pathogenic role. In particular, pamrevlumab appears to disrupt a CTGF autocrine loop in mesenchymal cells like myofibroblasts that reduces their recruitment of leukocytes like macrophages, mast and dendritic cells via chemokine secretion. This disruption results in collapse of the cellular crosstalk that drives tissue remodeling.    Prior to the conduct of today's visit, the subject was Pre-screened with the [employer] COVID-19 pre visit questionnaires which subject passed.  Clinical Research Coordinator note : This visit for [name] with DOB: 07/27/1956 on 03/03/2019 for the above protocol is Visit/Encounter # week 39  and is for purpose of Research . The consent for this encounter is currently IRB approved. Subject expressed continued interest and consent in continuing as a study subject. Subject confirmed that there was  no change in contact information (e.g. address, telephone, email). Subject thanked for participation  in research and contribution to science.   In this visit 03/03/2019 the subject will be evaluated by investigator named Dr. Isaiah SergeMannam. This research coordinator has verified that the investigator is up to date with his training. Today the subject presented with no active complaints. The subject stated he felt fine. The subject did however reports passing out on 02MAY2020 for "a brief" period of time. He reported that EMS was called and that Vitals were checked and were normal. He stated that they also checked his blood sugar and that was normal as well. Specifically coordinator asked the subject about blood pressure, heart rate, blood glucose, oxygen levels in which he replied all came back normal. I then asked if there was fever present at the time in which he confirmed there was not. However since that occurrence he has not had any repeat of this event. He was not given any intervention by EMS. EMS departed the subjects home and did not transport subject to Emergency department. Today the subject reports loss in taste he states began in Mid-March although subject has been denied loss in taste in pre screening COVID-19 survey in recent visits. But today states an onset of mid March 2020. The subject also stated only today that there has also been a decrease in smell with onset beginning mid March as well. Subject has also denied loss in smell in recent COVID-19 screening survey in recent study visits, but states today that this has been present since mid March 2020. Both currently ongoing per patient. Dr. Isaiah SergeMannam was present during review of AEs. Dr. Isaiah SergeMannam confirmed with study coordinator that Infusion could go on today as planned. Subject was infused with study drug without complication. And observation period was completed without any onset of Adverse  event. Subject was discharged from facility and will return in approximately 3 weeks for next interval study visit. For further details regarding todays visit please  refer to the subject paper source binder.  Additional details  1. Because this visit is a key visit of reconsent  this reconsent was under direct supervision of the Sub I Dr. Chilton Greathouse. The subject signed [employer] IRB Approved Version 10APR2020 today 11MAY2020 @ 09:22am.  2. Because the PI was NOT able to be on site due to Schedule, the sub-I reported and CRC has confirmed that the PI has discussed the visit a-priori with the sub-investigator   Additional Details RN on DUTY:Jenny B. Pharmacy personnel:Alison Grimsley IV line site: Right Hand IV line placed:09:58 Infusion Start time:10:47 Infusion End time::11:18 IV Line Removed::12:06 Patient left sight::12:20   Pre infusion Vitals  Temp: 98.4 F  BP-Left arm: 104/54  Pulse: 66 bpm  RR: 18 bpm  SpO2: 100% on 8 L/min  Post infusion Vitals  Temp: 98.7 F  BP-left arm: 100/65  Pulse: 69 bpm  RR: 22 bpm  Spo2: 100% on 8 L/min   Signed by  T.[name] BS, CCRC, CPhT Clinical Research Coordinator I [place], [place] 10:15 AM 03/03/2019  -Late entry tcm 12MAY2020

## 2019-03-04 ENCOUNTER — Other Ambulatory Visit: Payer: Self-pay | Admitting: Family Medicine

## 2019-03-04 ENCOUNTER — Telehealth: Payer: Self-pay | Admitting: *Deleted

## 2019-03-04 NOTE — Telephone Encounter (Signed)
This patient is a research subject in the FGCL-3019-091 study and completed a visit on Mar 03, 2019. PI, Dr. [name] asked that I call the patient to check in on him and see how he was feeling due to his recent fall and mention of loss of smell and loss of taste at his visit. Subject reported he was feeling ok, but was tired. He denied any changes in his breathing, denies cough, denies fever, denies any GI symptoms. Subject states he still has the loss of taste and smell that he mentioned on May 11th. Reviewed all with Dr. [name] and he feels this is not concerning for COVID-19 infection at this time. COVID-19 symptoms reviewed with the patient and patient advised if he develops any of the the symptoms to call the office or go to the ER if necessary. The patient stated that he did not receive a call prior to his visit on Monday Mar 03, 2019 asking him the  COVID screening questions like he has prior to past visits.     Carron Curie, BS, CMA, CCRC [place], [place] [phone]

## 2019-03-04 NOTE — Telephone Encounter (Signed)
Requesting refill    Ambien  LOV: 12/20/18  LRF:  12/05/18

## 2019-03-10 ENCOUNTER — Other Ambulatory Visit: Payer: Self-pay | Admitting: Physician Assistant

## 2019-03-18 ENCOUNTER — Other Ambulatory Visit: Payer: Self-pay

## 2019-03-18 ENCOUNTER — Inpatient Hospital Stay (HOSPITAL_COMMUNITY)
Admission: EM | Admit: 2019-03-18 | Discharge: 2019-03-20 | DRG: 291 | Disposition: A | Discharge status: Home Health | Payer: PPO | Source: Ambulatory Visit | Attending: Internal Medicine | Admitting: Internal Medicine

## 2019-03-18 ENCOUNTER — Emergency Department (HOSPITAL_COMMUNITY): Payer: PPO

## 2019-03-18 ENCOUNTER — Encounter: Payer: Self-pay | Admitting: Family Medicine

## 2019-03-18 ENCOUNTER — Ambulatory Visit (INDEPENDENT_AMBULATORY_CARE_PROVIDER_SITE_OTHER): Payer: PPO | Admitting: Family Medicine

## 2019-03-18 VITALS — BP 90/60 | HR 64 | Temp 97.6°F | Resp 24 | Ht 73.0 in

## 2019-03-18 DIAGNOSIS — E1169 Type 2 diabetes mellitus with other specified complication: Secondary | ICD-10-CM | POA: Diagnosis not present

## 2019-03-18 DIAGNOSIS — I5082 Biventricular heart failure: Secondary | ICD-10-CM | POA: Diagnosis not present

## 2019-03-18 DIAGNOSIS — E039 Hypothyroidism, unspecified: Secondary | ICD-10-CM | POA: Diagnosis present

## 2019-03-18 DIAGNOSIS — I130 Hypertensive heart and chronic kidney disease with heart failure and stage 1 through stage 4 chronic kidney disease, or unspecified chronic kidney disease: Principal | ICD-10-CM | POA: Diagnosis present

## 2019-03-18 DIAGNOSIS — J9601 Acute respiratory failure with hypoxia: Secondary | ICD-10-CM | POA: Diagnosis not present

## 2019-03-18 DIAGNOSIS — Z66 Do not resuscitate: Secondary | ICD-10-CM | POA: Diagnosis present

## 2019-03-18 DIAGNOSIS — R0609 Other forms of dyspnea: Secondary | ICD-10-CM | POA: Diagnosis not present

## 2019-03-18 DIAGNOSIS — I2510 Atherosclerotic heart disease of native coronary artery without angina pectoris: Secondary | ICD-10-CM | POA: Diagnosis not present

## 2019-03-18 DIAGNOSIS — Z79890 Hormone replacement therapy (postmenopausal): Secondary | ICD-10-CM

## 2019-03-18 DIAGNOSIS — J849 Interstitial pulmonary disease, unspecified: Secondary | ICD-10-CM | POA: Diagnosis present

## 2019-03-18 DIAGNOSIS — Z20828 Contact with and (suspected) exposure to other viral communicable diseases: Secondary | ICD-10-CM | POA: Diagnosis present

## 2019-03-18 DIAGNOSIS — Z833 Family history of diabetes mellitus: Secondary | ICD-10-CM

## 2019-03-18 DIAGNOSIS — N183 Chronic kidney disease, stage 3 unspecified: Secondary | ICD-10-CM | POA: Diagnosis present

## 2019-03-18 DIAGNOSIS — Z955 Presence of coronary angioplasty implant and graft: Secondary | ICD-10-CM

## 2019-03-18 DIAGNOSIS — J449 Chronic obstructive pulmonary disease, unspecified: Secondary | ICD-10-CM | POA: Diagnosis present

## 2019-03-18 DIAGNOSIS — Z7901 Long term (current) use of anticoagulants: Secondary | ICD-10-CM

## 2019-03-18 DIAGNOSIS — Z87891 Personal history of nicotine dependence: Secondary | ICD-10-CM

## 2019-03-18 DIAGNOSIS — J9621 Acute and chronic respiratory failure with hypoxia: Secondary | ICD-10-CM | POA: Diagnosis present

## 2019-03-18 DIAGNOSIS — Z7902 Long term (current) use of antithrombotics/antiplatelets: Secondary | ICD-10-CM

## 2019-03-18 DIAGNOSIS — E785 Hyperlipidemia, unspecified: Secondary | ICD-10-CM | POA: Diagnosis not present

## 2019-03-18 DIAGNOSIS — I509 Heart failure, unspecified: Secondary | ICD-10-CM

## 2019-03-18 DIAGNOSIS — I110 Hypertensive heart disease with heart failure: Secondary | ICD-10-CM | POA: Diagnosis not present

## 2019-03-18 DIAGNOSIS — J84112 Idiopathic pulmonary fibrosis: Secondary | ICD-10-CM | POA: Diagnosis not present

## 2019-03-18 DIAGNOSIS — I50813 Acute on chronic right heart failure: Secondary | ICD-10-CM | POA: Diagnosis not present

## 2019-03-18 DIAGNOSIS — I10 Essential (primary) hypertension: Secondary | ICD-10-CM | POA: Diagnosis not present

## 2019-03-18 DIAGNOSIS — Z515 Encounter for palliative care: Secondary | ICD-10-CM | POA: Diagnosis not present

## 2019-03-18 DIAGNOSIS — I5031 Acute diastolic (congestive) heart failure: Secondary | ICD-10-CM | POA: Diagnosis present

## 2019-03-18 DIAGNOSIS — Z9981 Dependence on supplemental oxygen: Secondary | ICD-10-CM | POA: Diagnosis not present

## 2019-03-18 DIAGNOSIS — R0902 Hypoxemia: Secondary | ICD-10-CM

## 2019-03-18 DIAGNOSIS — I5033 Acute on chronic diastolic (congestive) heart failure: Secondary | ICD-10-CM

## 2019-03-18 DIAGNOSIS — Z794 Long term (current) use of insulin: Secondary | ICD-10-CM

## 2019-03-18 DIAGNOSIS — I50811 Acute right heart failure: Secondary | ICD-10-CM | POA: Diagnosis not present

## 2019-03-18 DIAGNOSIS — Z7982 Long term (current) use of aspirin: Secondary | ICD-10-CM

## 2019-03-18 DIAGNOSIS — E1122 Type 2 diabetes mellitus with diabetic chronic kidney disease: Secondary | ICD-10-CM | POA: Diagnosis not present

## 2019-03-18 DIAGNOSIS — R0602 Shortness of breath: Secondary | ICD-10-CM | POA: Diagnosis not present

## 2019-03-18 DIAGNOSIS — Z950 Presence of cardiac pacemaker: Secondary | ICD-10-CM

## 2019-03-18 DIAGNOSIS — I5043 Acute on chronic combined systolic (congestive) and diastolic (congestive) heart failure: Secondary | ICD-10-CM | POA: Diagnosis present

## 2019-03-18 DIAGNOSIS — I4892 Unspecified atrial flutter: Secondary | ICD-10-CM | POA: Diagnosis present

## 2019-03-18 DIAGNOSIS — I2781 Cor pulmonale (chronic): Secondary | ICD-10-CM | POA: Diagnosis present

## 2019-03-18 DIAGNOSIS — I2729 Other secondary pulmonary hypertension: Secondary | ICD-10-CM | POA: Diagnosis not present

## 2019-03-18 DIAGNOSIS — R0600 Dyspnea, unspecified: Secondary | ICD-10-CM | POA: Diagnosis present

## 2019-03-18 DIAGNOSIS — I371 Nonrheumatic pulmonary valve insufficiency: Secondary | ICD-10-CM | POA: Diagnosis not present

## 2019-03-18 DIAGNOSIS — J9611 Chronic respiratory failure with hypoxia: Secondary | ICD-10-CM | POA: Diagnosis not present

## 2019-03-18 DIAGNOSIS — J438 Other emphysema: Secondary | ICD-10-CM | POA: Diagnosis present

## 2019-03-18 DIAGNOSIS — I361 Nonrheumatic tricuspid (valve) insufficiency: Secondary | ICD-10-CM | POA: Diagnosis not present

## 2019-03-18 DIAGNOSIS — I480 Paroxysmal atrial fibrillation: Secondary | ICD-10-CM | POA: Diagnosis present

## 2019-03-18 LAB — RESPIRATORY PANEL BY PCR

## 2019-03-18 LAB — COMPREHENSIVE METABOLIC PANEL
ALT: 13 U/L (ref 0–44)
AST: 18 U/L (ref 15–41)
Albumin: 3.2 g/dL — ABNORMAL LOW (ref 3.5–5.0)
Alkaline Phosphatase: 86 U/L (ref 38–126)
Anion gap: 16 — ABNORMAL HIGH (ref 5–15)
BUN: 16 mg/dL (ref 8–23)
CO2: 22 mmol/L (ref 22–32)
Calcium: 8.8 mg/dL — ABNORMAL LOW (ref 8.9–10.3)
Chloride: 101 mmol/L (ref 98–111)
Creatinine, Ser: 1.46 mg/dL — ABNORMAL HIGH (ref 0.61–1.24)
GFR calc Af Amer: 58 mL/min — ABNORMAL LOW (ref >60)
GFR calc non Af Amer: 50 mL/min — ABNORMAL LOW (ref >60)
Glucose, Bld: 71 mg/dL (ref 70–99)
Potassium: 3.3 mmol/L — ABNORMAL LOW (ref 3.5–5.1)
Sodium: 139 mmol/L (ref 135–145)
Total Bilirubin: 1 mg/dL (ref 0.3–1.2)
Total Protein: 6.5 g/dL (ref 6.5–8.1)

## 2019-03-18 LAB — CBC WITH DIFFERENTIAL/PLATELET
Abs Immature Granulocytes: 0.03 10*3/uL (ref 0.00–0.07)
Basophils Absolute: 0 10*3/uL (ref 0.0–0.1)
Basophils Relative: 1 %
Eosinophils Absolute: 0 10*3/uL (ref 0.0–0.5)
Eosinophils Relative: 0 %
HCT: 44 % (ref 39.0–52.0)
Hemoglobin: 13.3 g/dL (ref 13.0–17.0)
Immature Granulocytes: 0 %
Lymphocytes Relative: 10 %
Lymphs Abs: 0.8 10*3/uL (ref 0.7–4.0)
MCH: 25.5 pg — ABNORMAL LOW (ref 26.0–34.0)
MCHC: 30.2 g/dL (ref 30.0–36.0)
MCV: 84.5 fL (ref 80.0–100.0)
Monocytes Absolute: 0.6 10*3/uL (ref 0.1–1.0)
Monocytes Relative: 8 %
Neutro Abs: 6.3 10*3/uL (ref 1.7–7.7)
Neutrophils Relative %: 81 %
Platelets: 233 10*3/uL (ref 150–400)
RBC: 5.21 MIL/uL (ref 4.22–5.81)
RDW: 15.4 % (ref 11.5–15.5)
WBC: 7.8 10*3/uL (ref 4.0–10.5)
nRBC: 0 % (ref 0.0–0.2)

## 2019-03-18 LAB — TROPONIN I
Troponin I: 0.03 ng/mL (ref <0.03)
Troponin I: 0.04 ng/mL (ref <0.03)

## 2019-03-18 LAB — GLUCOSE, CAPILLARY
Glucose-Capillary: 63 mg/dL — ABNORMAL LOW (ref 70–99)
Glucose-Capillary: 223 mg/dL — ABNORMAL HIGH (ref 70–99)

## 2019-03-18 LAB — BRAIN NATRIURETIC PEPTIDE: B Natriuretic Peptide: 476.9 pg/mL — ABNORMAL HIGH (ref 0.0–100.0)

## 2019-03-18 LAB — SARS CORONAVIRUS 2 BY RT PCR (HOSPITAL ORDER, PERFORMED IN ~~LOC~~ HOSPITAL LAB): SARS Coronavirus 2: NEGATIVE

## 2019-03-18 LAB — STREP PNEUMONIAE URINARY ANTIGEN: Strep Pneumo Urinary Antigen: NEGATIVE

## 2019-03-18 MED ORDER — ATORVASTATIN CALCIUM 40 MG PO TABS
40.0000 mg | ORAL_TABLET | Freq: Every day | ORAL | Status: DC
  Administered 2019-03-18 – 2019-03-19 (×2): 40 mg via ORAL
  Filled 2019-03-18: qty 1

## 2019-03-18 MED ORDER — FUROSEMIDE 10 MG/ML IJ SOLN
40.0000 mg | Freq: Once | INTRAMUSCULAR | Status: AC
  Administered 2019-03-18: 40 mg via INTRAVENOUS
  Filled 2019-03-18: qty 4

## 2019-03-18 MED ORDER — FUROSEMIDE 10 MG/ML IJ SOLN: 80.0000 mg | Freq: Two times a day (BID) | INTRAMUSCULAR | Status: DC

## 2019-03-18 MED ORDER — OMEGA-3-ACID ETHYL ESTERS 1 G PO CAPS
2.0000 g | ORAL_CAPSULE | Freq: Two times a day (BID) | ORAL | Status: DC
  Administered 2019-03-18 – 2019-03-20 (×4): 2 g via ORAL
  Filled 2019-03-18 (×4): qty 2

## 2019-03-18 MED ORDER — ZOLPIDEM TARTRATE 5 MG PO TABS
10.0000 mg | ORAL_TABLET | Freq: Every day | ORAL | Status: DC
  Administered 2019-03-18 – 2019-03-19 (×2): 10 mg via ORAL
  Filled 2019-03-18 (×2): qty 2

## 2019-03-18 MED ORDER — SODIUM CHLORIDE 0.9 % IV SOLN
100.0000 mg | Freq: Once | INTRAVENOUS | Status: AC
  Administered 2019-03-18: 100 mg via INTRAVENOUS
  Filled 2019-03-18: qty 100

## 2019-03-18 MED ORDER — FUROSEMIDE 10 MG/ML IJ SOLN
40.0000 mg | Freq: Two times a day (BID) | INTRAMUSCULAR | Status: DC
  Administered 2019-03-18 – 2019-03-19 (×3): 40 mg via INTRAVENOUS
  Filled 2019-03-18 (×3): qty 4

## 2019-03-18 MED ORDER — ENOXAPARIN SODIUM 40 MG/0.4ML ~~LOC~~ SOLN: 40.0000 mg | SUBCUTANEOUS | Status: DC

## 2019-03-18 MED ORDER — POTASSIUM CHLORIDE CRYS ER 20 MEQ PO TBCR
40.0000 meq | EXTENDED_RELEASE_TABLET | Freq: Once | ORAL | Status: AC
  Administered 2019-03-18: 16:00:00 40 meq via ORAL
  Filled 2019-03-18: qty 2

## 2019-03-18 MED ORDER — ALPRAZOLAM 0.5 MG PO TABS: 0.5000 mg | ORAL_TABLET | Freq: Every evening | ORAL | Status: DC | PRN

## 2019-03-18 MED ORDER — METFORMIN HCL 850 MG PO TABS
850.0000 mg | ORAL_TABLET | Freq: Two times a day (BID) | ORAL | Status: DC
  Filled 2019-03-18: qty 1

## 2019-03-18 MED ORDER — DILTIAZEM HCL ER COATED BEADS 180 MG PO CP24
300.0000 mg | ORAL_CAPSULE | Freq: Every day | ORAL | Status: DC
  Administered 2019-03-19 – 2019-03-20 (×2): 300 mg via ORAL
  Filled 2019-03-18 (×2): qty 1

## 2019-03-18 MED ORDER — DABIGATRAN ETEXILATE MESYLATE 150 MG PO CAPS
150.0000 mg | ORAL_CAPSULE | Freq: Two times a day (BID) | ORAL | Status: DC
  Administered 2019-03-19 – 2019-03-20 (×4): 150 mg via ORAL
  Filled 2019-03-18 (×4): qty 1

## 2019-03-18 MED ORDER — SODIUM CHLORIDE 0.9 % IV SOLN
2.0000 g | Freq: Once | INTRAVENOUS | Status: AC
  Administered 2019-03-18: 2 g via INTRAVENOUS
  Filled 2019-03-18: qty 20

## 2019-03-18 MED ORDER — INSULIN GLARGINE 100 UNIT/ML ~~LOC~~ SOLN
60.0000 [IU] | Freq: Every day | SUBCUTANEOUS | Status: DC
  Administered 2019-03-19 (×2): 60 [IU] via SUBCUTANEOUS
  Filled 2019-03-18 (×3): qty 0.6

## 2019-03-18 MED ORDER — CANAGLIFLOZIN 100 MG PO TABS
100.0000 mg | ORAL_TABLET | Freq: Every day | ORAL | Status: DC
  Administered 2019-03-19: 07:00:00 100 mg via ORAL
  Filled 2019-03-18: qty 1

## 2019-03-18 MED ORDER — LEVOTHYROXINE SODIUM 25 MCG PO TABS
125.0000 ug | ORAL_TABLET | Freq: Every day | ORAL | Status: DC
  Administered 2019-03-19 – 2019-03-20 (×2): 125 ug via ORAL
  Filled 2019-03-18 (×2): qty 1

## 2019-03-18 NOTE — Progress Notes (Signed)
Pt. CBG 63. Insulin scheduled. On call for [facility] paged to make aware.

## 2019-03-18 NOTE — ED Notes (Signed)
Dr. Penne Lash aware of pts troponin.

## 2019-03-18 NOTE — ED Provider Notes (Signed)
[facility] EMERGENCY DEPARTMENT Provider Note   CSN: [id number] Arrival date & time: 03/18/19  1041    History   Chief Complaint Chief Complaint  Patient presents with  . Shortness of Breath    HPI Jerry Montgomery is a 63 y.o. male.     HPI 63 year old male with past medical history as below including pulmonary fibrosis on up to 8 to 10 L at home, diastolic heart failure, history of A. fib, here with shortness of breath with exertion.  The patient states that for the last several weeks, he denies progressively worsening dyspnea with exertion.  He has been taking his oxygen in the mornings due to increasing cough as well, and notes that it is been as low as the 60s despite using his 8 L of oxygen.  He has progressive worsening of fatigue.  He strongly denies any fevers and is tried to stay away from anyone due to quarantine.  No rash.  No sputum production.  He has not changed any of his medications.  He does receive infusion therapy for his pulmonary fibrosis and is due for that next week, but states he was in a private room and was not around anyone that he knows has been sick.  He does note increased lower extremity edema over the last several days.  He is approximately 2 pounds over his usual weight.  Past Medical History:  Diagnosis Date  . Acute diastolic heart failure ([place])   . Atrial fibrillation (HCC)    vs flutter  . Atrial flutter ([place])   . Atrial tachycardia ([place])   . CAD (coronary artery disease)    a. PCI of prox LAD and Cx in 2004. b. PCI to Cx 2008. c. PCI to ISR of Cx in 2011.  . Cardiomyopathy ([place])    a. EF 45-50% in 06/2018.  Marland Kitchen Chronic respiratory failure with hypoxia, on home O2 therapy ([place])   . CKD (chronic kidney disease), stage III ([place])   . COPD (chronic obstructive pulmonary disease) ([place])   . Emphysematous bleb ([place])    a.  s/p pleurectomy/pleurocentesis 2011  . Hyperlipidemia   . Hypertension   . Hypothyroidism   . Idiopathic  pulmonary fibrosis ([place])   . On home oxygen therapy    "3L; 24/7" (04/15/2015)  . Pneumothorax on right 4/11  . Postinflammatory pulmonary fibrosis ([place])   . Sleep apnea    "won't use CPAP" (04/14/2105)  . SVT (supraventricular tachycardia) ([place])    Per Dr. Tanna Furry note  . Type II diabetes mellitus ([place])   . WPW (Wolff-Parkinson-White syndrome)    Per Dr. Tanna Furry Note    Patient Active Problem List   Diagnosis Date Noted  . Paroxysmal atrial fibrillation ([place]) 10/28/2018  . CAD S/P percutaneous coronary angioplasty 07/24/2018  . Chronic anticoagulation 07/24/2018  . Hypotension 07/19/2017  . Bilateral sensorineural hearing loss 04/26/2017  . Right ear impacted cerumen 04/26/2017  . Research subject 06/27/2016  . Obesity 06/27/2016  . Other fatigue 09/14/2015  . Pulmonary fibrosis ([place])   . 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 Chronic respiratory failure with hypoxia ([place]) 03/09/2015  . Chronic renal disease, stage III ([place]) 03/09/2015  . Other emphysema ([place]) 03/09/2015  . Insulin dependent diabetes mellitus ([place]) 02/05/2015  . Atrial fibrillation with RVR ([place])   . Acute diastolic heart failure ([place])   . Hypokalemia   . IPF (idiopathic pulmonary fibrosis) ([place])   . History of PSVT (paroxysmal supraventricular tachycardia) 02/02/2015  . Bradycardia  05/05/2014  . Hypoxemia 08/06/2013  . Need for immunization against influenza 08/06/2013  . ILD (interstitial lung disease) ([place]) 07/08/2013  . Dyspnea 07/08/2013  . OSA (obstructive sleep apnea) 07/08/2013  . Pulmonary emphysema ([place]) 07/08/2013  . Chest wall pain 06/17/2013  . Chondral lesion 01/22/2013  . Cough 03/22/2012  . Pneumonia, organism unspecified(486) 03/07/2012  . DIABETES MELLITUS, TYPE II 02/23/2010  . COPD II 02/23/2010  . HYPERLIPIDEMIA 02/22/2010  . Essential hypertension 02/22/2010  . BRONCHIECTASIS 02/22/2010  . PULMONARY FIBROSIS 02/22/2010    Past Surgical History:  Procedure Laterality Date  . AV NODE  ABLATION N/A 10/28/2018   Procedure: AV NODE ABLATION;  Surgeon: Evans Lance, MD;  Location: [facility];  Service: Cardiovascular;  Laterality: N/A;  . BILATERAL VATS ABLATION Right   . CARDIAC CATHETERIZATION  05/03/2007   patent stents  . CARDIAC CATHETERIZATION N/A 05/18/2015   Procedure: Right Heart Cath;  Surgeon: Belva Crome, MD;  Location: [facility];  Service: Cardiovascular;  Laterality: N/A;  . CORONARY ANGIOPLASTY WITH STENT PLACEMENT  03/05/2003; 2008; 01/13/2010   PCI & Stent  LAD & mid CX; stent to CX  (I've got 4 stents total" (04/15/2015)  . ELECTROPHYSIOLOGIC STUDY N/A 04/14/2015   Procedure: SVT Ablation;  Surgeon: Evans Lance, MD;  Location: [facility];  Service: Cardiovascular;  Laterality: N/A;  . KNEE ARTHROSCOPY Right 01/22/2013   Procedure: RIGHT ARTHROSCOPY KNEE WITH DEBRIDEMENT, abrasion chondroplasty of lateral tibial plateau, debridement of partial tear of ACL, menisectomy;  Surgeon: Tobi Bastos, MD;  Location: [facility];  Service: Orthopedics;  Laterality: Right;  . KNEE SURGERY  2012   "reattached quad"  . LUNG LOBECTOMY Right   . PACEMAKER IMPLANT N/A 10/28/2018   Procedure: PACEMAKER IMPLANT;  Surgeon: Evans Lance, MD;  Location: [facility];  Service: Cardiovascular;  Laterality: N/A;  . SHOULDER ARTHROSCOPY W/ ROTATOR CUFF REPAIR Bilateral   . VIDEO ASSISTED THORACOSCOPY Left 07/19/2015   Procedure: LEFT VIDEO ASSISTED THORACOSCOPY WITH LEFT UPPER AND LOWER LUNG BIOPSY;  Surgeon: Melrose Nakayama, MD;  Location: [facility];  Service: Thoracic;  Laterality: Left;  Marland Kitchen VIDEO BRONCHOSCOPY N/A 07/19/2015   Procedure: VIDEO BRONCHOSCOPY WITH FLUORO;  Surgeon: Melrose Nakayama, MD;  Location: [facility];  Service: Thoracic;  Laterality: N/A;        Home Medications    Prior to Admission medications   Medication Sig Start Date End Date Taking? Authorizing Provider  ALPRAZolam Duanne Moron) 0.5 MG tablet Take 1 tablet (0.5 mg total) by  mouth at bedtime as needed for anxiety. 07/27/17   [name], MD  aspirin 81 MG tablet Take 81 mg by mouth every morning.     [provider]  atorvastatin (LIPITOR) 40 MG tablet Take 1 tablet by mouth every night at bedtime 03/04/19   [name], MD  Blood Glucose Monitoring Suppl (ONE TOUCH ULTRA 2) w/Device KIT Use to monitor FSBS 4x daily for fluctuating blood sugars. Dx: E11.65. 02/26/17   [name], MD  Cholecalciferol (VITAMIN D-3 PO) Take 1 capsule by mouth daily.    [provider]  Cyanocobalamin (VITAMIN B-12 PO) Take 1 tablet by mouth daily.    [provider]  dabigatran (PRADAXA) 150 MG CAPS capsule Take 150 mg by mouth 2 (two) times daily.    [provider]  dapagliflozin propanediol (FARXIGA) 10 MG TABS tablet Take 10 mg by mouth daily. 02/07/18  [name], MD  diltiazem (CARDIZEM CD) 300 MG 24 hr capsule TAKE 1 CAPSULE BY MOUTH EVERY DAY 03/11/19   Baldwin Jamaica, PA-C  furosemide (LASIX) 20 MG tablet Take 20 mg by mouth daily.    [provider]  glucose blood (ONE TOUCH ULTRA TEST) test strip Use to monitor FSBS 4x daily for fluctuating blood sugars. Dx: E11.65. 10/07/18   [name], MD  insulin aspart (NOVOLOG) 100 UNIT/ML injection 15 UNITS WITH BREAKFAST AND 15 UNITS WITH LUNCH, 20 UNITS WITH SUPPER Patient not taking: Reported on 03/18/2019 02/07/18   [name], MD  Investigational - Study Medication "Title: [id number] (FibroGen Study) is a Phase 3, randomized, double-blind, placebo-controlled multicenter international study to evaluate evaluate the efficacy and safety of 30 mg/kg IV infusions of pamrevlumab administered every 3 weeks for 52 weeks as compared to placebo in subjects with Idiopathic Pulmonary Fibrosis. Primary end point is: change in FVC from baseline at week 52"    [provider]  LANTUS 100 UNIT/ML injection INJECT 0.6 MLS (60 UNITS TOTAL) INTO THE SKIN AT  BEDTIME. 09/23/18   [name], MD  levothyroxine (SYNTHROID, LEVOTHROID) 125 MCG tablet Take 1 tablet (125 mcg total) by mouth daily. 07/08/18   [name], MD  MAGNESIUM PO Take 1 tablet by mouth daily.    [provider]  Menthol, Topical Analgesic, (BIOFREEZE EX) Apply 1 application topically as needed (for pain).    [provider]  metFORMIN (GLUCOPHAGE) 850 MG tablet Take 1 tablet by mouth twice a day 01/03/19   [name], MD  nitroGLYCERIN (NITROSTAT) 0.4 MG SL tablet Place 1 tablet (0.4 mg total) under the tongue every 5 (five) minutes as needed for chest pain. 08/12/18   [name], MD  omega-3 acid ethyl esters (LOVAZA) 1 g capsule Take 2 capsules (2 g total) by mouth 2 (two) times daily. 03/07/18   Troy Sine, MD  [facility] DELICA LANCETS FINE MISC Test up to 4 times a day as directed. 11/10/16   [name], MD  OXYGEN Inhale 6-8 L into the lungs continuous.    [provider]  POTASSIUM PO Take 1 tablet by mouth daily.    [provider]  zolpidem (AMBIEN) 10 MG tablet Take 1 tablet by mouth at bedtime. 03/04/19   [name], MD    Family History Family History  Problem Relation Age of Onset  . Breast cancer Mother   . Diabetes type II Mother   . Lung cancer Father     Social History Social History   Tobacco Use  . Smoking status: Former Smoker    Packs/day: 1.50    Years: 35.00    Pack years: 52.50    Types: Cigarettes    Last attempt to quit: 10/24/2007    Years since quitting: 11.4  . Smokeless tobacco: Never Used  Substance Use Topics  . Alcohol use: Yes    Alcohol/week: 0.0 standard drinks    Comment: 04/15/2015 "I've had a couple drinks in the last 8 months"  . Drug use: No     Allergies   Niaspan [niacin]; Ofev [nintedanib]; Pirfenidone; and Ramipril   Review of Systems Review of Systems  Constitutional: Positive for fatigue. Negative for chills and fever.  HENT: Negative for  congestion and rhinorrhea.   Eyes: Negative for visual disturbance.  Respiratory: Positive for cough and shortness of breath. Negative for wheezing.   Cardiovascular: Positive for  leg swelling. Negative for chest pain.  Gastrointestinal: Negative for abdominal pain, diarrhea, nausea and vomiting.  Genitourinary: Negative for dysuria and flank pain.  Musculoskeletal: Negative for neck pain and neck stiffness.  Skin: Negative for rash and wound.  Allergic/Immunologic: Negative for immunocompromised state.  Neurological: Positive for weakness. Negative for syncope and headaches.  All other systems reviewed and are negative.    Physical Exam Updated Vital Signs BP (!) 148/76   Pulse (!) 50   Resp 16   SpO2 100%   Physical Exam Vitals signs and nursing note reviewed.  Constitutional:      General: He is in acute distress.     Appearance: He is well-developed.     Comments: Chronically ill appearing  HENT:     Head: Normocephalic and atraumatic.  Eyes:     Conjunctiva/sclera: Conjunctivae normal.  Neck:     Musculoskeletal: Neck supple.  Cardiovascular:     Rate and Rhythm: Normal rate and regular rhythm.     Heart sounds: Normal heart sounds. No murmur. No friction rub.  Pulmonary:     Effort: Pulmonary effort is normal. Tachypnea present. No respiratory distress.     Breath sounds: Decreased breath sounds and rhonchi present. No wheezing or rales.  Abdominal:     General: There is no distension.     Palpations: Abdomen is soft.     Tenderness: There is no abdominal tenderness.  Skin:    General: Skin is warm.     Capillary Refill: Capillary refill takes less than 2 seconds.  Neurological:     Mental Status: He is alert and oriented to person, place, and time.     Motor: No abnormal muscle tone.      ED Treatments / Results  Labs (all labs ordered are listed, but only abnormal results are displayed) Labs Reviewed  CBC WITH DIFFERENTIAL/PLATELET - Abnormal; Notable  for the following components:      Result Value   MCH 25.5 (*)    All other components within normal limits  COMPREHENSIVE METABOLIC PANEL - Abnormal; Notable for the following components:   Potassium 3.3 (*)    Creatinine, Ser 1.46 (*)    Calcium 8.8 (*)    Albumin 3.2 (*)    GFR calc non Af Amer 50 (*)    GFR calc Af Amer 58 (*)    Anion gap 16 (*)    All other components within normal limits  BRAIN NATRIURETIC PEPTIDE - Abnormal; Notable for the following components:   B Natriuretic Peptide 476.9 (*)    All other components within normal limits  TROPONIN I - Abnormal; Notable for the following components:   Troponin I 0.03 (*)    All other components within normal limits  SARS CORONAVIRUS 2 (HOSPITAL ORDER, [facility])  CULTURE, BLOOD (ROUTINE X 2)  CULTURE, BLOOD (ROUTINE X 2)    EKG EKG Interpretation  Date/Time:  Tuesday Mar 18 2019 10:55:46 EDT Ventricular Rate:  62 PR Interval:    QRS Duration: 156 QT Interval:  485 QTC Calculation: 493 R Axis:   -33 Text Interpretation:  Ventricular premature complex Nonspecific intraventricular conduction delay cular delay  V4 Confirmed by Duffy Bruce [phone]) on 03/18/2019 12:23:16 PM   Radiology Dg Chest Portable 1 View  Result Date: 03/18/2019 CLINICAL DATA:  Shortness of breath. EXAM: PORTABLE CHEST 1 VIEW COMPARISON:  Radiographs of November 20, 2018. FINDINGS: Stable cardiomediastinal silhouette. Left-sided pacemaker is unchanged in position. No pneumothorax  is noted. Stable bilateral lung opacities are noted most consistent with multifocal pneumonia. Small bilateral pleural effusions are noted, right greater than left. Bony thorax is unremarkable. IMPRESSION: Stable bilateral lung opacities are noted most consistent with multifocal pneumonia. Small bilateral pleural effusions are noted. Electronically Signed   By: Marijo Conception M.D.   On: 03/18/2019 11:52    Procedures .Critical Care  Performed by: Duffy Bruce, MD Authorized by: Duffy Bruce, MD   Critical care provider statement:    Critical care time (minutes):  35   Critical care time was exclusive of:  Separately billable procedures and treating other patients and teaching time   Critical care was necessary to treat or prevent imminent or life-threatening deterioration of the following conditions:  Circulatory failure, cardiac failure and respiratory failure   Critical care was time spent personally by me on the following activities:  Development of treatment plan with patient or surrogate, discussions with consultants, evaluation of patient's response to treatment, examination of patient, obtaining history from patient or surrogate, ordering and performing treatments and interventions, ordering and review of laboratory studies, ordering and review of radiographic studies, pulse oximetry, re-evaluation of patient's condition and review of old charts   I assumed direction of critical care for this patient from another provider in my specialty: no     (including critical care time)  Medications Ordered in ED Medications  cefTRIAXone (ROCEPHIN) 2 g in sodium chloride 0.9 % 100 mL IVPB (2 g Intravenous New Bag/Given 03/18/19 1508)  doxycycline (VIBRAMYCIN) 100 mg in sodium chloride 0.9 % 250 mL IVPB (100 mg Intravenous New Bag/Given 03/18/19 1507)  potassium chloride SA (K-DUR) CR tablet 40 mEq (has no administration in time range)  furosemide (LASIX) injection 40 mg (40 mg Intravenous Given 03/18/19 1504)     Initial Impression / Assessment and Plan / ED Course  I have reviewed the triage vital signs and the nursing notes.  Pertinent labs & imaging results that were available during my care of the patient were reviewed by me and considered in my medical decision making (see chart for details).        63 yo M here with worsening DOE, profound hypoxia w/ exertion despite baseline 8-10 L O2 requirement.  Fortunately, O2 Sats near baseline when at rest. Suspect acute on chronic hypoxic resp failure, either 2/2 PNA, pulm fibrosis exacerbation, or more likely CHF. CXR w/ multifocal PNA vs edema. Trop, BNP elevated more c/w edema. Lasix, empiric ABX given.  Given his complex pulm history and high O2 requirement, will ask Intensivist to see pt for recommendations/admission.   Final Clinical Impressions(s) / ED Diagnoses   Final diagnoses:  Acute on chronic respiratory failure with hypoxemia ([place])  Acute on chronic diastolic congestive heart failure [facility])    ED Discharge Orders    None       Duffy Bruce, MD 03/18/19 1526

## 2019-03-18 NOTE — ED Triage Notes (Signed)
Per pt, daughter came over this morning to check on him and his fingers were blue and he was having trouble breathing. Pt's daughter secured appointment with PCP. Pt then sent here from PCP office for shob. Hx pulmonary fibrosis, wears 8-10 L O2 at home. Endorses cough and losing sense of taste over the last month.

## 2019-03-18 NOTE — Plan of Care (Signed)
  Problem: Education: Goal: Knowledge of General Education information will improve Description Including pain rating scale, medication(s)/side effects and non-pharmacologic comfort measures Outcome: Progressing   Problem: Activity: Goal: Risk for activity intolerance will decrease Outcome: Progressing   Problem: Safety: Goal: Ability to remain free from injury will improve Outcome: Progressing   

## 2019-03-18 NOTE — ED Notes (Signed)
ED TO INPATIENT HANDOFF REPORT  ED Nurse Name and Phone #: [phone]  S Name/Age/Gender Jerry Montgomery 63 y.o. male Room/Bed: 035C/035C  Code Status   Code Status: DNR  Home/SNF/Other Home Patient oriented to: self, place, time and situation Is this baseline? Yes   Triage Complete: Triage complete  Chief Complaint Per doctor his breathing is shallow   Triage Note Per pt, daughter came over this morning to check on him and his fingers were blue and he was having trouble breathing. Pt's daughter secured appointment with PCP. Pt then sent here from PCP office for shob. Hx pulmonary fibrosis, wears 8-10 L O2 at home. Endorses cough and losing sense of taste over the last month.    Allergies Allergies  Allergen Reactions  . Niaspan [Niacin] Itching  . Ofev [Nintedanib] Other (See Comments)    "Lethargic and wiped out"  . Pirfenidone Other (See Comments)    Caused the patient to unexpectedly wet the bed in the middle of the night (was never a problem prior to taking this med)  . Ramipril Cough         Level of Care/Admitting Diagnosis ED Disposition    ED Disposition Condition Comment   Admit  Hospital Area: [facility] [[id number]]  Level of Care: Telemetry Cardiac [103]  Covid Evaluation: N/A  Diagnosis: Congestive heart failure (CHF) [facility]) [[id number]]  Admitting Physician: Carron Curie Bai.Lain  Attending Physician: Sharyon Medicus, ALI Bai.Lain  Estimated length of stay: past midnight tomorrow  Certification:: I certify this patient will need inpatient services for at least 2 midnights  PT Class (Do Not Modify): Inpatient [101]  PT Acc Code (Do Not Modify): Private [1]       B Medical/Surgery History Past Medical History:  Diagnosis Date  . Acute diastolic heart failure (HCC)   . Atrial fibrillation (HCC)    vs flutter  . Atrial flutter (HCC)   . Atrial tachycardia (HCC)   . CAD (coronary artery disease)    a. PCI of prox LAD and Cx in 2004. b. PCI to Cx  2008. c. PCI to ISR of Cx in 2011.  . Cardiomyopathy (HCC)    a. EF 45-50% in 06/2018.  Marland Kitchen Chronic respiratory failure with hypoxia, on home O2 therapy (HCC)   . CKD (chronic kidney disease), stage III (HCC)   . COPD (chronic obstructive pulmonary disease) (HCC)   . Emphysematous bleb (HCC)    a.  s/p pleurectomy/pleurocentesis 2011  . Hyperlipidemia   . Hypertension   . Hypothyroidism   . Idiopathic pulmonary fibrosis (HCC)   . On home oxygen therapy    "3L; 24/7" (04/15/2015)  . Pneumothorax on right 4/11  . Postinflammatory pulmonary fibrosis (HCC)   . Sleep apnea    "won't use CPAP" (04/14/2105)  . SVT (supraventricular tachycardia) (HCC)    Per Dr. Lubertha Basque note  . Type II diabetes mellitus (HCC)   . WPW (Wolff-Parkinson-White syndrome)    Per Dr. Lubertha Basque Note   Past Surgical History:  Procedure Laterality Date  . AV NODE ABLATION N/A 10/28/2018   Procedure: AV NODE ABLATION;  Surgeon: [name], MD;  Location: [facility] INVASIVE CV LAB;  Service: Cardiovascular;  Laterality: N/A;  . BILATERAL VATS ABLATION Right   . CARDIAC CATHETERIZATION  05/03/2007   patent stents  . CARDIAC CATHETERIZATION N/A 05/18/2015   Procedure: Right Heart Cath;  Surgeon: Lyn Records, MD;  Location: [facility] INVASIVE CV LAB;  Service: Cardiovascular;  Laterality: N/A;  .  CORONARY ANGIOPLASTY WITH STENT PLACEMENT  03/05/2003; 2008; 01/13/2010   PCI & Stent  LAD & mid CX; stent to CX  (I've got 4 stents total" (04/15/2015)  . ELECTROPHYSIOLOGIC STUDY N/A 04/14/2015   Procedure: SVT Ablation;  Surgeon: Marinus MawGregg W Taylor, MD;  [facility];  Service: Cardiovascular;  Laterality: N/A;  . KNEE ARTHROSCOPY Right 01/22/2013   Procedure: RIGHT ARTHROSCOPY KNEE WITH DEBRIDEMENT, abrasion chondroplasty of lateral tibial plateau, debridement of partial tear of ACL, menisectomy;  Surgeon: Jacki Conesonald A Gioffre, MD;  Location: [facility] ORS;  Service: Orthopedics;  Laterality: Right;  . KNEE SURGERY  2012   "reattached quad"   . LUNG LOBECTOMY Right   . PACEMAKER IMPLANT N/A 10/28/2018   Procedure: PACEMAKER IMPLANT;  Surgeon: Marinus Mawaylor, Gregg W, MD;  [facility];  Service: Cardiovascular;  Laterality: N/A;  . SHOULDER ARTHROSCOPY W/ ROTATOR CUFF REPAIR Bilateral   . VIDEO ASSISTED THORACOSCOPY Left 07/19/2015   Procedure: LEFT VIDEO ASSISTED THORACOSCOPY WITH LEFT UPPER AND LOWER LUNG BIOPSY;  Surgeon: Loreli SlotSteven C Hendrickson, MD;  Location: [facility] OR;  Service: Thoracic;  Laterality: Left;  Marland Kitchen. VIDEO BRONCHOSCOPY N/A 07/19/2015   Procedure: VIDEO BRONCHOSCOPY WITH FLUORO;  Surgeon: Loreli SlotSteven C Hendrickson, MD;  [facility];  Service: Thoracic;  Laterality: N/A;     A IV Location/Drains/Wounds Patient Lines/Drains/Airways Status   Active Line/Drains/Airways    Name:   Placement date:   Placement time:   Site:   Days:   Peripheral IV 03/18/19 Right Hand   03/18/19    1259    Hand   less than 1   Sheath 10/28/18 Left   10/28/18    1600    -   141   Incision 01/22/13 Knee Right   01/22/13    1024     2246   Incision (Closed) 07/19/15 Chest Left   07/19/15    0938     1338   Incision (Closed) 07/19/15 Chest Left   07/19/15    0945     1338   Incision (Closed) 07/19/15 Chest Left   07/19/15    0946     1338   Incision (Closed) 10/28/18 Chest Left;Upper   10/28/18    1730     141          Intake/Output Last 24 hours  Intake/Output Summary (Last 24 hours) at 03/18/2019 1937 Last data filed at 03/18/2019 1801 Gross per 24 hour  Intake 250 ml  Output -  Net 250 ml    Labs/Imaging Results for orders placed or performed during the hospital encounter of 03/18/19 (from the past 48 hour(s))  CBC with Differential     Status: Abnormal   Collection Time: 03/18/19 11:24 AM  Result Value Ref Range   WBC 7.8 4.0 - 10.5 K/uL   RBC 5.21 4.22 - 5.81 MIL/uL   Hemoglobin 13.3 13.0 - 17.0 g/dL   HCT 96.044.0 45.439.0 - 09.852.0 %   MCV 84.5 80.0 - 100.0 fL   MCH 25.5 (L) 26.0 - 34.0 pg   MCHC 30.2 30.0 - 36.0 g/dL   RDW  11.915.4 14.711.5 - 82.915.5 %   Platelets 233 150 - 400 K/uL   nRBC 0.0 0.0 - 0.2 %   Neutrophils Relative % 81 %   Neutro Abs 6.3 1.7 - 7.7 K/uL   Lymphocytes Relative 10 %   Lymphs Abs 0.8 0.7 - 4.0 K/uL   Monocytes Relative 8 %   Monocytes Absolute  0.6 0.1 - 1.0 K/uL   Eosinophils Relative 0 %   Eosinophils Absolute 0.0 0.0 - 0.5 K/uL   Basophils Relative 1 %   Basophils Absolute 0.0 0.0 - 0.1 K/uL   Immature Granulocytes 0 %   Abs Immature Granulocytes 0.03 0.00 - 0.07 K/uL    Comment: Performed at [facility] Lab, [address], [place], [place] [address]  Comprehensive metabolic panel     Status: Abnormal   Collection Time: 03/18/19 11:24 AM  Result Value Ref Range   Sodium 139 135 - 145 mmol/L   Potassium 3.3 (L) 3.5 - 5.1 mmol/L   Chloride 101 98 - 111 mmol/L   CO2 22 22 - 32 mmol/L   Glucose, Bld 71 70 - 99 mg/dL   BUN 16 8 - 23 mg/dL   Creatinine, Ser 5.70 (H) 0.61 - 1.24 mg/dL   Calcium 8.8 (L) 8.9 - 10.3 mg/dL   Total Protein 6.5 6.5 - 8.1 g/dL   Albumin 3.2 (L) 3.5 - 5.0 g/dL   AST 18 15 - 41 U/L   ALT 13 0 - 44 U/L   Alkaline Phosphatase 86 38 - 126 U/L   Total Bilirubin 1.0 0.3 - 1.2 mg/dL   GFR calc non Af Amer 50 (L) >60 mL/min   GFR calc Af Amer 58 (L) >60 mL/min   Anion gap 16 (H) 5 - 15    Comment: Performed at [facility], [address] [place], [place] [address]  Brain natriuretic peptide     Status: Abnormal   Collection Time: 03/18/19 11:24 AM  Result Value Ref Range   B Natriuretic Peptide 476.9 (H) 0.0 - 100.0 pg/mL    Comment: Performed at [facility], [address] [place], [place] [address]  Troponin I - ONCE - STAT     Status: Abnormal   Collection Time: 03/18/19 11:24 AM  Result Value Ref Range   Troponin I 0.03 (HH) <0.03 ng/mL    Comment: CRITICAL RESULT CALLED TO, READ BACK BY AND VERIFIED WITH: L.BISHOP,RN 1441 03/18/2019 CLARK,S Performed at [facility] Lab, [address], [place], [place] [address]   SARS Coronavirus  2 (CEPHEID- Performed in [facility] hospital lab), Hosp Order     Status: None   Collection Time: 03/18/19 12:58 PM  Result Value Ref Range   SARS Coronavirus 2 NEGATIVE NEGATIVE    Comment: (NOTE) If result is NEGATIVE SARS-CoV-2 target nucleic acids are NOT DETECTED. The SARS-CoV-2 RNA is generally detectable in upper and lower  respiratory specimens during the acute phase of infection. The lowest  concentration of SARS-CoV-2 viral copies this assay can detect is 250  copies / mL. A negative result does not preclude SARS-CoV-2 infection  and should not be used as the sole basis for treatment or other  patient management decisions.  A negative result may occur with  improper specimen collection / handling, submission of specimen other  than nasopharyngeal swab, presence of viral mutation(s) within the  areas targeted by this assay, and inadequate number of viral copies  (<250 copies / mL). A negative result must be combined with clinical  observations, patient history, and epidemiological information. If result is POSITIVE SARS-CoV-2 target nucleic acids are DETECTED. The SARS-CoV-2 RNA is generally detectable in upper and lower  respiratory specimens dur ing the acute phase of infection.  Positive  results are indicative of active infection with SARS-CoV-2.  Clinical  correlation with patient history and other  diagnostic information is  necessary to determine patient infection status.  Positive results do  not rule out bacterial infection or co-infection with other viruses. If result is PRESUMPTIVE POSTIVE SARS-CoV-2 nucleic acids MAY BE PRESENT.   A presumptive positive result was obtained on the submitted specimen  and confirmed on repeat testing.  While 2019 novel coronavirus  (SARS-CoV-2) nucleic acids may be present in the submitted sample  additional confirmatory testing may be necessary for epidemiological  and / or clinical management purposes  to differentiate between   SARS-CoV-2 and other Sarbecovirus currently known to infect humans.  If clinically indicated additional testing with an alternate test  methodology [phone]) is advised. The SARS-CoV-2 RNA is generally  detectable in upper and lower respiratory sp ecimens during the acute  phase of infection. The expected result is Negative. Fact Sheet for Patients:  BoilerBrush.com.cy Fact Sheet for Healthcare Providers: https://pope.com/ This test is not yet approved or cleared by the [place] FDA and has been authorized for detection and/or diagnosis of SARS-CoV-2 by FDA under an Emergency Use Authorization (EUA).  This EUA will remain in effect (meaning this test can be used) for the duration of the COVID-19 declaration under Section 564(b)(1) of the Act, 21 U.S.C. section 360bbb-3(b)(1), unless the authorization is terminated or revoked sooner. Performed at [facility], [address], [place], [place] [address]    Dg Chest Portable 1 View  Result Date: 03/18/2019 CLINICAL DATA:  Shortness of breath. EXAM: PORTABLE CHEST 1 VIEW COMPARISON:  Radiographs of November 20, 2018. FINDINGS: Stable cardiomediastinal silhouette. Left-sided pacemaker is unchanged in position. No pneumothorax is noted. Stable bilateral lung opacities are noted most consistent with multifocal pneumonia. Small bilateral pleural effusions are noted, right greater than left. Bony thorax is unremarkable. IMPRESSION: Stable bilateral lung opacities are noted most consistent with multifocal pneumonia. Small bilateral pleural effusions are noted. Electronically Signed   By: Lupita Raider M.D.   On: 03/18/2019 11:52    Pending Labs Unresulted Labs (From admission, onward)    Start     Ordered   03/19/19 0500  Procalcitonin  Daily,   R     03/18/19 1704   03/19/19 0500  Troponin I - Tomorrow AM 0500  Tomorrow morning,   R     03/18/19 1704   03/18/19 1702  Respiratory  Panel by PCR  (Respiratory virus panel with precautions)  Once,   R     03/18/19 1704   03/18/19 1702  Legionella Pneumophila Serogp 1 Ur Ag  Once,   R     03/18/19 1704   03/18/19 1702  Strep pneumoniae urinary antigen  Once,   R     03/18/19 1704   03/18/19 1412  Blood culture (routine x 2)  BLOOD CULTURE X 2,   STAT     03/18/19 1414   Signed and Held  Troponin I - Now Then Q6H  Now then every 6 hours,   R     Signed and Held          Vitals/Pain Today's Vitals   03/18/19 1830 03/18/19 1845 03/18/19 1850 03/18/19 1900  BP: 125/73 122/78  116/71  Pulse: 60 (!) 59  (!) 59  Resp: 16 (!) 25  (!) 24  SpO2: 96% 98%  95%  PainSc:   0-No pain     Isolation Precautions Droplet precaution  Medications Medications  furosemide (LASIX) injection 40 mg (40 mg Intravenous Given 03/18/19 1802)  cefTRIAXone (ROCEPHIN)  2 g in sodium chloride 0.9 % 100 mL IVPB (0 g Intravenous Stopped 03/18/19 1617)  doxycycline (VIBRAMYCIN) 100 mg in sodium chloride 0.9 % 250 mL IVPB (0 mg Intravenous Stopped 03/18/19 1801)  furosemide (LASIX) injection 40 mg (40 mg Intravenous Given 03/18/19 1504)  potassium chloride SA (K-DUR) CR tablet 40 mEq (40 mEq Oral Given 03/18/19 1556)    Mobility walks Low fall risk   Focused Assessments    R Recommendations: See Admitting Provider Note  Report given to:   Additional Notes:

## 2019-03-18 NOTE — ED Notes (Signed)
Attempted to call report to floor 

## 2019-03-18 NOTE — Consult Note (Addendum)
NAME:  Jerry Montgomery, MRN:  [id number], DOB:  1956-08-01, LOS: 0 ADMISSION DATE:  03/18/2019, CONSULTATION DATE:  5/26 REFERRING MD: isaacs , CHIEF COMPLAINT:  Acute on chronic hypoxic respiratory failure   Brief History   63 year old male patient chronic respiratory failure setting of IPF.  Being admitted with acute on chronic respiratory failure secondary to decompensated biventricular heart failure  History of present illness   63 year old male patient followed by Dr. Chase Caller in the outpatient setting for interstitial pulmonary fibrosis currently on ILD study.  He is on baseline 8 L, this is something that is been progressive over the last 6 to 9 months up from 6 L previously.  Presents to his primary care provider today on 5/26 with chief complaint of worsening shortness of breath, increased lower extremity swelling, and increased oxygen requirement up to 10 L from baseline of 8.  He has had to increase it up to 15 L to keep his saturations above 90%.  On presentation he was tachypneic, mildly tachycardic, and remained hypoxic in his primary care providers office and therefore was referred to the emergency room for further evaluation. ER evaluation: -Hypoxic on arrival; required escalating supplemental oxygen -afebrile, nml wbc ct, BNP 476.9, COVID-19 neg scr 1.46, CXR patchy bilateral airspace disease fairly similar to previous films   -Of note over the last month or so has been woken up by his spouse, noted to have blue fingers, nose, and ear tips.  Tells me pulse oximetry in the 65% range. -Additionally he has been using conservative measures to make his oxygen last longer at home.  He is typically on 8 L however he will often move it down to 4 L during activity to try to conserve his portable oxygen further -He has had progressive fatigue particularly over the last several months  Past Medical History  Afib/flutter w/ recent AV nodal ablation and need for PPM back in jan 2020, diastolic  heart dysfunction, EF 45-50%, CKD stage III, COPD with prior pleurectomy and pleurocentesis, hypothyroidism, idiopathic pulmonary fibrosis on home oxygen 3 L 24/7 sleep apnea refuses BiPAP.  Type 2 diabetes -> Per discussion with Dr. Chase Caller has had declining pulmonary function over the last 6 to 9 months requiring now 8 L oxygen and had previously been on 6. -> Currently enrolled in pulmonary fibrosis study,  Significant Hospital Events     Consults:    Procedures:    Significant Diagnostic Tests:  ECHO 5/26>>>  Micro Data:  COVID -19 5/26: neg   Antimicrobials:    Interim history/subjective:  Very short of breath  Objective   Blood pressure 116/75, pulse 60, resp. rate (Abnormal) 28, SpO2 96 %.       No intake or output data in the 24 hours ending 03/18/19 1454 There were no vitals filed for this visit.  Examination: General: Pleasant 63 year old male patient speaking in short 3-2 word phrases HENT: Normocephalic atraumatic no jugular venous distention Lungs: Diffuse rales no wheezing no rhonchi Cardiovascular: Regular irregular Abdomen: Soft nontender Extremities: Pitting edema bilateral lower extremities Neuro: Awake oriented no focal deficits GU: Voids spontaneously  Resolved Hospital Problem list     Assessment & Plan:  Acute on chronic Hypoxic respiratory failure in setting of bilateral pulm infiltrates. Most likely a mix of progression of his underlying ILD and probably volume overload/pulmonary edema.  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  -suspect progressive hypoxia, f/b RV decompensation then LV decompensation Plan IV Lasix Preload and afterload reduction as tolerated  H/o afib; has PPM Plan Telemetry monitoring Continuing beta-blockade Continue Pradaxa  CKD stage III Plan Close observation of chemistry with IV diuresis  DM type II and hypothyroidism  Plan ssi  Cont synthroid   Best practice:  Diet: As tolerated Pain/Anxiety/Delirium protocol (if indicated): Not indicated VAP protocol (if indicated): Not indicated DVT prophylaxis: To be determined GI prophylaxis: Not indicated Glucose control: Sliding scale insulin Mobility: As tolerated Code Status: DNR, DNI, no BiPAP Family Communication: Pending Disposition: Admit to internal medicine  Labs   CBC: Recent Labs  Lab 03/18/19 1124  WBC 7.8  NEUTROABS 6.3  HGB 13.3  HCT 44.0  MCV 84.5  PLT 878    Basic Metabolic Panel: Recent Labs  Lab 03/18/19 1124  NA 139  K 3.3*  CL 101  CO2 22  GLUCOSE 71  BUN 16  CREATININE 1.46*  CALCIUM 8.8*   GFR: CrCl cannot be calculated (Unknown ideal weight.). Recent Labs  Lab 03/18/19 1124  WBC 7.8    Liver Function Tests: Recent Labs  Lab 03/18/19 1124  AST 18  ALT 13  ALKPHOS 86  BILITOT 1.0  PROT 6.5  ALBUMIN 3.2*   No results for input(s): LIPASE, AMYLASE in the last 168 hours. No results for input(s): AMMONIA in the last 168 hours.  ABG    Component Value Date/Time   PHART 7.467 (H) 07/22/2015 1240   PCO2ART 33.4 (L) 07/22/2015 1240   PO2ART 72.6 (L) 07/22/2015 1240   HCO3 23.9 07/22/2015 1240   TCO2 24.9 07/22/2015 1240   ACIDBASEDEF 0.4 07/15/2015 1026   O2SAT 95.6 07/22/2015 1240     Coagulation Profile: No results for input(s): INR, PROTIME in the last 168 hours.  Cardiac Enzymes: Recent Labs  Lab 03/18/19 1124  TROPONINI 0.03*    HbA1C: Hgb A1c MFr Bld  Date/Time Value Ref Range  Status  07/05/2018 10:33 AM 8.7 (H) <5.7 % of total Hgb Final    Comment:    For someone without known diabetes, a hemoglobin A1c value of 6.5% or greater indicates that they may have  diabetes and this should be confirmed with a follow-up  test. . For someone with known diabetes, a value <7% indicates  that their diabetes is well controlled and a value  greater than or equal to 7% indicates suboptimal  control. A1c targets should be individualized based on  duration of diabetes, age, comorbid conditions, and  other considerations. . Currently, no consensus exists regarding use of hemoglobin A1c for diagnosis of diabetes for children. .   02/01/2018 10:42 AM 9.1 (H) <5.7 % of total Hgb Final    Comment:    For someone without known diabetes, a hemoglobin A1c value of 6.5% or greater indicates that they may have  diabetes and this should be confirmed with a follow-up  test. . For someone with known diabetes, a value <7% indicates  that their diabetes is well controlled and a value  greater than or equal to 7% indicates suboptimal  control.  A1c targets should be individualized based on  duration of diabetes, age, comorbid conditions, and  other considerations. . Currently, no consensus exists regarding use of hemoglobin A1c for diagnosis of diabetes for children. .     CBG: No results for input(s): GLUCAP in the last 168 hours.  Review of Systems:   Review of Systems  Constitutional: Positive for malaise/fatigue and weight loss. Negative for chills, diaphoresis and fever.  HENT: Negative for congestion, sinus pain and sore throat.   Eyes: Negative for blurred vision.  Respiratory: Positive for cough and shortness of breath. Negative for sputum production.   Cardiovascular: Positive for orthopnea, leg swelling and PND. Negative for chest pain and palpitations.  Gastrointestinal:       Loss of taste and appetite over last few months  Genitourinary: Negative.    Musculoskeletal: Negative.   Skin: Negative.   Neurological: Positive for dizziness and weakness.  Endo/Heme/Allergies: Negative.      Past Medical History  He,  has a past medical history of Acute diastolic heart failure ([place]), Atrial fibrillation ([place]), Atrial flutter ([place]), Atrial tachycardia ([place]), CAD (coronary artery disease), Cardiomyopathy ([place]), Chronic respiratory failure with hypoxia, on home O2 therapy ([place]), CKD (chronic kidney disease), stage III ([place]), COPD (chronic obstructive pulmonary disease) ([place]), Emphysematous bleb ([place]), Hyperlipidemia, Hypertension, Hypothyroidism, Idiopathic pulmonary fibrosis ([place]), On home oxygen therapy, Pneumothorax on right (4/11), Postinflammatory pulmonary fibrosis ([place]), Sleep apnea, SVT (supraventricular tachycardia) ([place]), Type II diabetes mellitus ([place]), and WPW (Wolff-Parkinson-White syndrome).   Surgical History    Past Surgical History:  Procedure Laterality Date  . AV NODE ABLATION N/A 10/28/2018   Procedure: AV NODE ABLATION;  Surgeon: Evans Lance, MD;  Location: [facility];  Service: Cardiovascular;  Laterality: N/A;  . BILATERAL VATS ABLATION Right   . CARDIAC CATHETERIZATION  05/03/2007   patent stents  . CARDIAC CATHETERIZATION N/A 05/18/2015   Procedure: Right Heart Cath;  Surgeon: Belva Crome, MD;  Location: [facility];  Service: Cardiovascular;  Laterality: N/A;  . CORONARY ANGIOPLASTY WITH STENT PLACEMENT  03/05/2003; 2008; 01/13/2010   PCI & Stent  LAD & mid CX; stent to CX  (I've got 4 stents total" (04/15/2015)  . ELECTROPHYSIOLOGIC STUDY N/A 04/14/2015   Procedure: SVT Ablation;  Surgeon: Evans Lance, MD;  Location: [facility];  Service: Cardiovascular;  Laterality: N/A;  . KNEE ARTHROSCOPY Right 01/22/2013   Procedure: RIGHT ARTHROSCOPY KNEE WITH DEBRIDEMENT, abrasion chondroplasty of lateral tibial plateau, debridement of partial tear of ACL, menisectomy;  Surgeon: Tobi Bastos, MD;   Location: [facility];  Service: Orthopedics;  Laterality: Right;  . KNEE SURGERY  2012   "reattached quad"  . LUNG LOBECTOMY Right   . PACEMAKER IMPLANT N/A 10/28/2018   Procedure: PACEMAKER IMPLANT;  Surgeon: Evans Lance, MD;  Location: [facility];  Service: Cardiovascular;  Laterality: N/A;  . SHOULDER ARTHROSCOPY W/ ROTATOR CUFF REPAIR Bilateral   . VIDEO ASSISTED THORACOSCOPY Left 07/19/2015   Procedure: LEFT VIDEO ASSISTED THORACOSCOPY WITH LEFT UPPER AND LOWER LUNG BIOPSY;  Surgeon: Melrose Nakayama, MD;  Location: [facility];  Service: Thoracic;  Laterality: Left;  Marland Kitchen VIDEO BRONCHOSCOPY N/A 07/19/2015   Procedure: VIDEO BRONCHOSCOPY WITH FLUORO;  Surgeon: Melrose Nakayama, MD;  Location: [facility];  Service: Thoracic;  Laterality: N/A;     Social History   reports that he quit smoking about 11 years ago. His smoking use included cigarettes. He has a 52.50 pack-year  smoking history. He has never used smokeless tobacco. He reports current alcohol use. He reports that he does not use drugs.   Family History   His family history includes Breast cancer in his mother; Diabetes type II in his mother; Lung cancer in his father.   Allergies Allergies  Allergen Reactions  . Niaspan [Niacin] Itching  . Ofev [Nintedanib] Other (See Comments)    "Lethargic and wiped out"  . Pirfenidone Other (See Comments)    Caused the patient to unexpectedly wet the bed in the middle of the night (was never a problem prior to taking this med)  . Ramipril Cough          Home Medications  Prior to Admission medications   Medication Sig Start Date End Date Taking? Authorizing Provider  ALPRAZolam Duanne Moron) 0.5 MG tablet Take 1 tablet (0.5 mg total) by mouth at bedtime as needed for anxiety. 07/27/17   [name], MD  aspirin 81 MG tablet Take 81 mg by mouth every morning.     [provider]  atorvastatin (LIPITOR) 40 MG tablet Take 1 tablet by mouth every night at bedtime 03/04/19    [name], MD  Blood Glucose Monitoring Suppl (ONE TOUCH ULTRA 2) w/Device KIT Use to monitor FSBS 4x daily for fluctuating blood sugars. Dx: E11.65. 02/26/17   [name], MD  Cholecalciferol (VITAMIN D-3 PO) Take 1 capsule by mouth daily.    [provider]  Cyanocobalamin (VITAMIN B-12 PO) Take 1 tablet by mouth daily.    [provider]  dabigatran (PRADAXA) 150 MG CAPS capsule Take 150 mg by mouth 2 (two) times daily.    [provider]  dapagliflozin propanediol (FARXIGA) 10 MG TABS tablet Take 10 mg by mouth daily. 02/07/18   [name], MD  diltiazem (CARDIZEM CD) 300 MG 24 hr capsule TAKE 1 CAPSULE BY MOUTH EVERY DAY 03/11/19   Baldwin Jamaica, PA-C  furosemide (LASIX) 20 MG tablet Take 20 mg by mouth daily.    [provider]  glucose blood (ONE TOUCH ULTRA TEST) test strip Use to monitor FSBS 4x daily for fluctuating blood sugars. Dx: E11.65. 10/07/18   [name], MD  insulin aspart (NOVOLOG) 100 UNIT/ML injection 15 UNITS WITH BREAKFAST AND 15 UNITS WITH LUNCH, 20 UNITS WITH SUPPER Patient not taking: Reported on 03/18/2019 02/07/18   [name], MD  Investigational - Study Medication "Title: [phone] (FibroGen Study) is a Phase 3, randomized, double-blind, placebo-controlled multicenter international study to evaluate evaluate the efficacy and safety of 30 mg/kg IV infusions of pamrevlumab administered every 3 weeks for 52 weeks as compared to placebo in subjects with Idiopathic Pulmonary Fibrosis. Primary end point is: change in FVC from baseline at week 52"    [provider]  LANTUS 100 UNIT/ML injection INJECT 0.6 MLS (60 UNITS TOTAL) INTO THE SKIN AT BEDTIME. 09/23/18   [name], MD  levothyroxine (SYNTHROID, LEVOTHROID) 125 MCG tablet Take 1 tablet (125 mcg total) by mouth daily. 07/08/18   [name], MD  MAGNESIUM PO Take 1 tablet by mouth daily.    [provider]  Menthol,  Topical Analgesic, (BIOFREEZE EX) Apply 1 application topically as needed (for pain).    [provider]  metFORMIN (GLUCOPHAGE) 850 MG tablet Take 1 tablet by mouth twice a day 01/03/19   [name], MD  nitroGLYCERIN (NITROSTAT) 0.4 MG SL tablet Place 1 tablet (0.4 mg total) under the  tongue every 5 (five) minutes as needed for chest pain. 08/12/18   [name], MD  omega-3 acid ethyl esters (LOVAZA) 1 g capsule Take 2 capsules (2 g total) by mouth 2 (two) times daily. 03/07/18   Troy Sine, MD  [facility] DELICA LANCETS FINE MISC Test up to 4 times a day as directed. 11/10/16   [name], MD  OXYGEN Inhale 6-8 L into the lungs continuous.    [provider]  POTASSIUM PO Take 1 tablet by mouth daily.    [provider]  zolpidem (AMBIEN) 10 MG tablet Take 1 tablet by mouth at bedtime. 03/04/19   [name], MD     Critical care time: na   Erick Colace ACNP-BC [place] Pager # [phone] OR # [phone] if no answer

## 2019-03-18 NOTE — ED Notes (Signed)
md at bedside

## 2019-03-18 NOTE — H&P (Signed)
[facility]                                                                                    Patient Demographics  Jerry Montgomery, is a 63 y.o. male  CSN: [id number]  MRN: [id number]  DOB - Aug 05, 1956  Admit Date - 03/18/2019  Outpatient Primary MD for the patient is [name], MD   With History of -  Past Medical History:  Diagnosis Date  . Acute diastolic heart failure ([place])   . Atrial fibrillation (HCC)    vs flutter  . Atrial flutter ([place])   . Atrial tachycardia ([place])   . CAD (coronary artery disease)    a. PCI of prox LAD and Cx in 2004. b. PCI to Cx 2008. c. PCI to ISR of Cx in 2011.  . Cardiomyopathy ([place])    a. EF 45-50% in 06/2018.  Marland Kitchen Chronic respiratory failure with hypoxia, on home O2 therapy ([place])   . CKD (chronic kidney disease), stage III ([place])   . COPD (chronic obstructive pulmonary disease) ([place])   . Emphysematous bleb ([place])    a.  s/p pleurectomy/pleurocentesis 2011  . Hyperlipidemia   . Hypertension   . Hypothyroidism   . Idiopathic pulmonary fibrosis ([place])   . On home oxygen therapy    "3L; 24/7" (04/15/2015)  . Pneumothorax on right 4/11  . Postinflammatory pulmonary fibrosis ([place])   . Sleep apnea    "won't use CPAP" (04/14/2105)  . SVT (supraventricular tachycardia) ([place])    Per Dr. Tanna Furry note  . Type II diabetes mellitus ([place])   . WPW (Wolff-Parkinson-White syndrome)    Per Dr. Tanna Furry Note      Past Surgical History:  Procedure Laterality Date  . AV NODE ABLATION N/A 10/28/2018   Procedure: AV NODE ABLATION;  Surgeon: Evans Lance, MD;  Location: [place] [facility];  Service: Cardiovascular;  Laterality: N/A;  . BILATERAL VATS ABLATION Right   . CARDIAC CATHETERIZATION  05/03/2007   patent stents  . CARDIAC CATHETERIZATION N/A 05/18/2015   Procedure: Right Heart Cath;  Surgeon: Belva Crome, MD;  Location: [place] [facility];  Service: Cardiovascular;  Laterality: N/A;  . CORONARY ANGIOPLASTY WITH STENT  PLACEMENT  03/05/2003; 2008; 01/13/2010   PCI & Stent  LAD & mid CX; stent to CX  (I've got 4 stents total" (04/15/2015)  . ELECTROPHYSIOLOGIC STUDY N/A 04/14/2015   Procedure: SVT Ablation;  Surgeon: Evans Lance, MD;  Location: [place] [facility];  Service: Cardiovascular;  Laterality: N/A;  . KNEE ARTHROSCOPY Right 01/22/2013   Procedure: RIGHT ARTHROSCOPY KNEE WITH DEBRIDEMENT, abrasion chondroplasty of lateral tibial plateau, debridement of partial tear of ACL, menisectomy;  Surgeon: Tobi Bastos, MD;  Location: [facility];  Service: Orthopedics;  Laterality: Right;  . KNEE SURGERY  2012   "reattached quad"  . LUNG LOBECTOMY Right   . PACEMAKER IMPLANT N/A 10/28/2018   Procedure: PACEMAKER IMPLANT;  Surgeon: Evans Lance, MD;  Location: [place] [facility];  Service: Cardiovascular;  Laterality: N/A;  . SHOULDER ARTHROSCOPY W/ ROTATOR CUFF REPAIR Bilateral   . VIDEO ASSISTED THORACOSCOPY Left 07/19/2015  Procedure: LEFT VIDEO ASSISTED THORACOSCOPY WITH LEFT UPPER AND LOWER LUNG BIOPSY;  Surgeon: Melrose Nakayama, MD;  Location: [place];  Service: Thoracic;  Laterality: Left;  Marland Kitchen VIDEO BRONCHOSCOPY N/A 07/19/2015   Procedure: VIDEO BRONCHOSCOPY WITH FLUORO;  Surgeon: Melrose Nakayama, MD;  Location: [place] [place];  Service: Thoracic;  Laterality: N/A;    in for   Chief Complaint  Patient presents with  . Shortness of Breath     HPI  Jerry Montgomery  is a 63 y.o. male, with past medical history significant for pulmonary fibrosis on 8 L nasal cannula at home who is part of a study with IV fibrinogen trial with Mab against connective tissue growth factor receiving treatment every 3 weeks with good tolerance, presenting with increasing S OB and oxygen requirement.  The patient also has a history of A. fib/RVR status post defibrillator placement after multiple trials to convert and history of diastolic congestive heart failure.  Patient denies any history of fever chills, chest pains but reports  progressive weakness and increased lower extremity  edema    Review of Systems    In addition to the HPI above,  No Fever-chills, No Headache, No changes with Vision or hearing, No problems swallowing food or Liquids, No Chest pain,  No Abdominal pain, No Nausea or Vommitting, Bowel movements are regular, No Blood in stool or Urine, No dysuria, No new skin rashes or bruises, No new joints pains-aches,  No polyuria, polydypsia or polyphagia, No significant Mental Stressors.  A full 10 point Review of Systems was done, except as stated above, all other Review of Systems were negative.   Social History Social History   Tobacco Use  . Smoking status: Former Smoker    Packs/day: 1.50    Years: 35.00    Pack years: 52.50    Types: Cigarettes    Last attempt to quit: 10/24/2007    Years since quitting: 11.4  . Smokeless tobacco: Never Used  Substance Use Topics  . Alcohol use: Yes    Alcohol/week: 0.0 standard drinks    Comment: 04/15/2015 "I've had a couple drinks in the last 8 months"     Family History Family History  Problem Relation Age of Onset  . Breast cancer Mother   . Diabetes type II Mother   . Lung cancer Father      Prior to Admission medications   Medication Sig Start Date End Date Taking? Authorizing Provider  ALPRAZolam Duanne Moron) 0.5 MG tablet Take 1 tablet (0.5 mg total) by mouth at bedtime as needed for anxiety. 07/27/17   [name], MD  aspirin 81 MG tablet Take 81 mg by mouth every morning.     [provider]  atorvastatin (LIPITOR) 40 MG tablet Take 1 tablet by mouth every night at bedtime 03/04/19   [name], MD  Blood Glucose Monitoring Suppl (ONE TOUCH ULTRA 2) w/Device KIT Use to monitor FSBS 4x daily for fluctuating blood sugars. Dx: E11.65. 02/26/17   [name], MD  Cholecalciferol (VITAMIN D-3 PO) Take 1 capsule by mouth daily.    [provider]  Cyanocobalamin (VITAMIN B-12 PO) Take 1 tablet by mouth  daily.    [provider]  dabigatran (PRADAXA) 150 MG CAPS capsule Take 150 mg by mouth 2 (two) times daily.    [provider]  dapagliflozin propanediol (FARXIGA) 10 MG TABS tablet Take 10 mg by mouth daily. 02/07/18   [name], MD  diltiazem (CARDIZEM  CD) 300 MG 24 hr capsule TAKE 1 CAPSULE BY MOUTH EVERY DAY 03/11/19   Baldwin Jamaica, PA-C  furosemide (LASIX) 20 MG tablet Take 20 mg by mouth daily.    [provider]  glucose blood (ONE TOUCH ULTRA TEST) test strip Use to monitor FSBS 4x daily for fluctuating blood sugars. Dx: E11.65. 10/07/18   [name], MD  insulin aspart (NOVOLOG) 100 UNIT/ML injection 15 UNITS WITH BREAKFAST AND 15 UNITS WITH LUNCH, 20 UNITS WITH SUPPER Patient not taking: Reported on 03/18/2019 02/07/18   [name], MD  Investigational - Study Medication "Title: [phone] (FibroGen Study) is a Phase 3, randomized, double-blind, placebo-controlled multicenter international study to evaluate evaluate the efficacy and safety of 30 mg/kg IV infusions of pamrevlumab administered every 3 weeks for 52 weeks as compared to placebo in subjects with Idiopathic Pulmonary Fibrosis. Primary end point is: change in FVC from baseline at week 52"    [provider]  LANTUS 100 UNIT/ML injection INJECT 0.6 MLS (60 UNITS TOTAL) INTO THE SKIN AT BEDTIME. 09/23/18   [name], MD  levothyroxine (SYNTHROID, LEVOTHROID) 125 MCG tablet Take 1 tablet (125 mcg total) by mouth daily. 07/08/18   [name], MD  MAGNESIUM PO Take 1 tablet by mouth daily.    [provider]  Menthol, Topical Analgesic, (BIOFREEZE EX) Apply 1 application topically as needed (for pain).    [provider]  metFORMIN (GLUCOPHAGE) 850 MG tablet Take 1 tablet by mouth twice a day 01/03/19   [name], MD  nitroGLYCERIN (NITROSTAT) 0.4 MG SL tablet Place 1 tablet (0.4 mg total) under the tongue every 5 (five) minutes as  needed for chest pain. 08/12/18   [name], MD  omega-3 acid ethyl esters (LOVAZA) 1 g capsule Take 2 capsules (2 g total) by mouth 2 (two) times daily. 03/07/18   Troy Sine, MD  [facility] DELICA LANCETS FINE MISC Test up to 4 times a day as directed. 11/10/16   [name], MD  OXYGEN Inhale 6-8 L into the lungs continuous.    [provider]  POTASSIUM PO Take 1 tablet by mouth daily.    [provider]  zolpidem (AMBIEN) 10 MG tablet Take 1 tablet by mouth at bedtime. 03/04/19   [name], MD    Allergies  Allergen Reactions  . Niaspan [Niacin] Itching  . Ofev [Nintedanib] Other (See Comments)    "Lethargic and wiped out"  . Pirfenidone Other (See Comments)    Caused the patient to unexpectedly wet the bed in the middle of the night (was never a problem prior to taking this med)  . Ramipril Cough         Physical Exam  Vitals  Blood pressure (!) 134/91, pulse 60, resp. rate (!) 25, SpO2 97 %.   1. General chronically ill looks tired,  2. Normal affect and insight, Not Suicidal or Homicidal, Awake Alert, Oriented X 3.  3. No F.N deficits, grossly, patient moving all extremities.  4. Ears and Eyes appear Normal, Conjunctivae clear, PERRLA. Moist Oral Mucosa.  5. Supple Neck, No JVD, No cervical lymphadenopathy appriciated, No Carotid Bruits.  6. Symmetrical Chest wall movement, Good air movement bilaterally, CTAB.  7. RRR, No Gallops, Rubs or Murmurs, No Parasternal Heave.  8. Positive Bowel Sounds, Abdomen Soft, Non tender, No organomegaly appriciated,No rebound -guarding or rigidity.  9.  No Cyanosis, Normal Skin Turgor, No Skin Rash or Bruise.  10. Good muscle tone,  joints appear normal , +2 lower extremity edema.    Data Review  CBC Recent Labs  Lab 03/18/19 1124  WBC 7.8  HGB 13.3  HCT 44.0  PLT 233  MCV 84.5  MCH 25.5*  MCHC 30.2  RDW 15.4  LYMPHSABS 0.8  MONOABS 0.6  EOSABS 0.0  BASOSABS 0.0    ------------------------------------------------------------------------------------------------------------------  Chemistries  Recent Labs  Lab 03/18/19 1124  NA 139  K 3.3*  CL 101  CO2 22  GLUCOSE 71  BUN 16  CREATININE 1.46*  CALCIUM 8.8*  AST 18  ALT 13  ALKPHOS 86  BILITOT 1.0   ------------------------------------------------------------------------------------------------------------------ CrCl cannot be calculated (Unknown ideal weight.). ------------------------------------------------------------------------------------------------------------------ No results for input(s): TSH, T4TOTAL, T3FREE, THYROIDAB in the last 72 hours.  Invalid input(s): FREET3   Coagulation profile No results for input(s): INR, PROTIME in the last 168 hours. ------------------------------------------------------------------------------------------------------------------- No results for input(s): DDIMER in the last 72 hours. -------------------------------------------------------------------------------------------------------------------  Cardiac Enzymes Recent Labs  Lab 03/18/19 1124  TROPONINI 0.03*   ------------------------------------------------------------------------------------------------------------------ Invalid input(s): POCBNP   ---------------------------------------------------------------------------------------------------------------  Urinalysis    Component Value Date/Time   COLORURINE YELLOW 07/15/2015 1025   APPEARANCEUR CLOUDY (A) 07/15/2015 1025   LABSPEC 1.024 07/15/2015 1025   PHURINE 5.5 07/15/2015 1025   GLUCOSEU 250 (A) 07/15/2015 1025   HGBUR NEGATIVE 07/15/2015 1025   BILIRUBINUR NEGATIVE 07/15/2015 1025   KETONESUR NEGATIVE 07/15/2015 1025   PROTEINUR NEGATIVE 07/15/2015 1025   UROBILINOGEN 0.2 07/15/2015 1025   NITRITE POSITIVE (A) 07/15/2015 1025   LEUKOCYTESUR MODERATE (A) 07/15/2015 1025     ----------------------------------------------------------------------------------------------------------------    Imaging results:   Dg Chest Portable 1 View  Result Date: 03/18/2019 CLINICAL DATA:  Shortness of breath. EXAM: PORTABLE CHEST 1 VIEW COMPARISON:  Radiographs of November 20, 2018. FINDINGS: Stable cardiomediastinal silhouette. Left-sided pacemaker is unchanged in position. No pneumothorax is noted. Stable bilateral lung opacities are noted most consistent with multifocal pneumonia. Small bilateral pleural effusions are noted, right greater than left. Bony thorax is unremarkable. IMPRESSION: Stable bilateral lung opacities are noted most consistent with multifocal pneumonia. Small bilateral pleural effusions are noted. Electronically Signed   By: Marijo Conception M.D.   On: 03/18/2019 11:52    My personal review of EKG: Paced rhythm at 62 bpm  Assessment & Plan  Acute hypoxemia in a patient with history of IPF. Reviewed consults and discussed with Dr. Chase Caller and it seems this deterioration is due to acute  congestive heart failure with pulmonary edema. Start patient on Lasix Pulmonology on consult  IPF, advanced on 8 L nasal cannula at home.  Regular patient of Dr. Chase Caller, on a clinical trial to be resumed by pulmonary discretion  Combined systolic and diastolic congestive heart failure, last echocardiogram done on 06/2018 showing ejection fraction 40-50% with grade 2 diastolic dysfunction Continue with Lasix and fluid restriction  Atrial fib/flutter , Failed antiarrhythmics, now with pacemaker implant  Diabetes mellitus type 2, insulin-dependent ISS Continue with Lantus as well and hold [place] .   Hyperlipidemia Continue with Lipitor  Chronic kidney disease stage III, slightly worse (1.22-1.46) Monitor on diuresis    DVT Prophylaxis Lovenox  AM Labs Ordered, also please review Full Orders  Code Status DNR  Disposition Plan: Home  Time spent in  minutes : 42 minutes  Condition GUARDED   _0 @

## 2019-03-18 NOTE — ED Provider Notes (Signed)
Signout from Dr. Erma Heritage.  63 year old male with significant pulmonary fibrosis 8 L nasal cannula home O2 at baseline here with increased shortness of breath and peripheral edema. Physical Exam  BP (!) 148/76   Pulse (!) 50   Resp 16   SpO2 100%   Physical Exam  ED Course/Procedures   Clinical Course as of Mar 18 1622  Tue Mar 18, 2019  1634 Discussed with hospitalist Dr. Sharyon Medicus who will evaluate the patient for admission.   [MB]    Clinical Course User Index [MB] Terrilee Files, MD    Procedures  MDM  Patient will require admission to the hospital.  The intensivists have been consulted and evaluated the patient.  They feel he would be appropriate for medical team and they will consult.  The hospitalist has been paged for admission.       Terrilee Files, MD 03/19/19 [phone]

## 2019-03-18 NOTE — Progress Notes (Signed)
Subjective:    Patient ID: Jerry Montgomery, male    DOB: 1956/05/27, 63 y.o.   MRN: [id number]  HPI  11/19/18 Patient was recently admitted to the hospital on January 6 for pacemaker placement with AV nodal ablation due to his persistent A. fib/flutter that was difficult to control.  Charge from the hospital on January 7.  Postoperative chest x-ray showed no evidence of pneumothorax.  Digoxin was discontinued.  He was continued on diltiazem 300 mg a day.    Over the last 48 hours, the patient has noticed increasing shortness of breath.  He is normally on 6 L at home.  However he was satting 80% on 6 L at home.  They have cranked his oxygen up to 8 L and he is 90% at home.  He denies any chest pain.  He denies any pleurisy.  He denies any fevers or chills.  However for 1 week he has had a runny nose and some congestion prior to this.  He did cough yesterday and had some green sputum.  On examination, he has diminished breath sounds bilaterally with inspiratory crackles in both bases which are chronic.  There are no wheezes.  There is no pitting edema in his extremities or evidence of fluid overload on exam.  At that time, my plan was: I believe the patient is having acute exacerbation of his pulmonary fibrosis likely triggered by recent upper respiratory infection.  I will obtain a chest x-ray, CBC, BNP.  Continue oxygen at home 6 to 8 L as necessary to maintain oxygen saturation greater than 90.  Begin Depo-Medrol 80 mg IM x1 now and then start prednisone 60 mg a day at home.  Add amoxicillin 1 g p.o. 3 times daily as well as a Z-Pak to cover for possible community-acquired pneumonia until the results of the chest x-ray are back given the bibasilar crackles.  Reassess in 24 to 48 hours or go to the emergency room immediately if shortness of breath worsens or he develops increased work of breathing  11/21/18 CXR revealed: IMPRESSION: 1. New multifocal pneumonia superimposed on chronic interstitial lung  disease.  Patient is clinically better.  Oxygen saturations are up to 95% now on his standard 6 L.  He states that his breathing has improved.  I suspect given the multifocal opacity seen on chest x-ray that this likely represents an underlying exacerbation of his pulmonary fibrosis rather than multifocal pneumonia.  Therefore I believe that his improvement is based on the glucocorticoids rather than his antibiotics.  He denies any fevers or chills.  BNP was slightly elevated though not high enough to suggest congestive heart failure.  Furthermore I do not appreciate any evidence of fluid overload on his exam.  Findings would support acute exacerbation of pulmonary fibrosis as most likely explanation.  At that time, my plan was:  He has responded to glucocorticoid therapy.  Current recommendations are to wean patient's down slowly over a period of weeks.  Therefore I recommended that he begin tapering down on prednisone.  Decrease to 50 mg a day for 4 days, then 40 mg a day for 4 days, then 30 mg a day for 4 days, then 20 mg a day for 4 days, then 10 mg a day for 4 days, then discontinue.  This will complete a 3-week taper gradually off the prednisone as recommended.  Recheck immediately if symptoms worsen or if symptoms change in any way.  12/20/18 Patient states that he is no  better from his last visit.  He is seen no improvement in his shortness of breath.  He is weaned down to 30 mg a day of prednisone however he states that he saw no improvement even on the higher doses of prednisone.  He still reports a cough productive of brown sputum.  He also reports dyspnea on exertion and tightness in the right side of his chest and also in the right upper back.  He denies any fevers or chills.  He denies any hemoptysis.  He denies any pleurisy.  He denies any angina.  He denies any fluid retention.  There is minimal pitting edema in both ankles however his weight is unchanged from his last visit and there is no  evidence of fluid overload on exam.  Remarkably his pulmonary exam is actually relatively clear.  There are no significant inspiratory crackles I can appreciate on exam.  His lungs actually sound better than last time however he does have diminished breath sounds bilaterally.  Patient is not currently on any type of inhaler for his COPD.  This was discontinued in the past when he was diagnosed with pulmonary fibrosis.  Peak flows were obtained.  Peak flow was 500, 500, 525 L/min.  He was then given a DuoNeb/2.5 mg of albuterol and 0.5 mg of Atrovent/nebulizer treatment.  Peak flow afterward was 500, 700, 575 L/min.  At that time, my plan was: Patient's peak flows improved after the DuoNeb although subjectively he saw no significant change.  I have assumed that his fatigue is due to dyspnea on exertion and shortness of breath particular given the changes found on his recent x-ray.  However there appears to be a reversible component with bronchodilator therapy and he does have a diagnosis of COPD in the past.  Therefore I am want to start the patient on samples of Stiolto, 2 inhalations once daily and reassess the patient in 2 weeks to see if this is helping his breathing and also helping his fatigue by helping his breathing.  I will asked the patient to discontinue prednisone as he has seen no benefit from it and it is only exacerbating his glycemic control.  I have not checked for other potential causes of fatigue.  The patient states that he just has no energy and I have assumed it is due to the issues with his heart and his pulmonary fibrosis however at this point I will also check for vitamin B12 deficiency as well as hypergonadism.  His thyroid was checked in the end of December and was normal.  He had a CBC at the end of January that was normal and therefore I do not think there are thyroid issues or anemia at play.  Reassess the patient in 2 weeks.  If no better, I will consult his pulmonologist to see if  there are any other options for therapy.  03/18/19 Patient presents today with shortness of breath.  This is gradually been worsening over the last week.  Is also noticed swelling in both of his legs gradually worsening over the last week.  Today he is on 10 L of oxygen.  He is 86% on 10 L of oxygen.  If he turns it up to 15, his oxygen level is 91%.  He has increased respiratory effort.  He has increased work of breathing.  He is mildly tachypneic.  His oxygen bottle ran out while he was sitting in the room talking to [place].  I gave him oxygen tank and  in the 1 minute it took to switch out his oxygen tank, the patient visibly came cyanotic.  After reestablishing 10 L, the patient's oxygen saturation rose to 89%.  On pulmonary exam, he has fine crackles all throughout his lungs consistent with his pulmonary fibrosis however I do not hear any rhonchorous breath sounds or evidence of pulmonary edema.  He does have trace bipedal edema up to his knee however I believe this is likely due to right-sided heart failure due to his underlying pulmonary fibrosis rather than evidence of a CHF exacerbation. Past Medical History:  Diagnosis Date   Acute diastolic heart failure (HCC)    Atrial fibrillation (HCC)    vs flutter   Atrial flutter (HCC)    Atrial tachycardia (HCC)    CAD (coronary artery disease)    a. PCI of prox LAD and Cx in 2004. b. PCI to Cx 2008. c. PCI to ISR of Cx in 2011.   Cardiomyopathy ([place])    a. EF 45-50% in 06/2018.   Chronic respiratory failure with hypoxia, on home O2 therapy (HCC)    CKD (chronic kidney disease), stage III (HCC)    COPD (chronic obstructive pulmonary disease) (HCC)    Emphysematous bleb (HCC)    a.  s/p pleurectomy/pleurocentesis 2011   Hyperlipidemia    Hypertension    Hypothyroidism    Idiopathic pulmonary fibrosis ([place])    On home oxygen therapy    "3L; 24/7" (04/15/2015)   Pneumothorax on right 4/11   Postinflammatory pulmonary fibrosis (HCC)     Sleep apnea    "won't use CPAP" (04/14/2105)   SVT (supraventricular tachycardia) ([place])    Per Dr. Tanna Furry note   Type II diabetes mellitus ([place])    WPW (Wolff-Parkinson-White syndrome)    Per Dr. Tanna Furry Note   Past Surgical History:  Procedure Laterality Date   AV NODE ABLATION N/A 10/28/2018   Procedure: AV NODE ABLATION;  Surgeon: Evans Lance, MD;  Location: [facility];  Service: Cardiovascular;  Laterality: N/A;   BILATERAL VATS ABLATION Right    CARDIAC CATHETERIZATION  05/03/2007   patent stents   CARDIAC CATHETERIZATION N/A 05/18/2015   Procedure: Right Heart Cath;  Surgeon: Belva Crome, MD;  Location: [facility];  Service: Cardiovascular;  Laterality: N/A;   CORONARY ANGIOPLASTY WITH STENT PLACEMENT  03/05/2003; 2008; 01/13/2010   PCI & Stent  LAD & mid CX; stent to CX  (I've got 4 stents total" (04/15/2015)   ELECTROPHYSIOLOGIC STUDY N/A 04/14/2015   Procedure: SVT Ablation;  Surgeon: Evans Lance, MD;  Location: [facility];  Service: Cardiovascular;  Laterality: N/A;   KNEE ARTHROSCOPY Right 01/22/2013   Procedure: RIGHT ARTHROSCOPY KNEE WITH DEBRIDEMENT, abrasion chondroplasty of lateral tibial plateau, debridement of partial tear of ACL, menisectomy;  Surgeon: Tobi Bastos, MD;  Location: [facility];  Service: Orthopedics;  Laterality: Right;   KNEE SURGERY  2012   "reattached quad"   LUNG LOBECTOMY Right    PACEMAKER IMPLANT N/A 10/28/2018   Procedure: PACEMAKER IMPLANT;  Surgeon: Evans Lance, MD;  Location: [facility];  Service: Cardiovascular;  Laterality: N/A;   SHOULDER ARTHROSCOPY W/ ROTATOR CUFF REPAIR Bilateral    VIDEO ASSISTED THORACOSCOPY Left 07/19/2015   Procedure: LEFT VIDEO ASSISTED THORACOSCOPY WITH LEFT UPPER AND LOWER LUNG BIOPSY;  Surgeon: Melrose Nakayama, MD;  Location: [facility];  Service: Thoracic;  Laterality: Left;   VIDEO BRONCHOSCOPY N/A 07/19/2015   Procedure: VIDEO  BRONCHOSCOPY WITH FLUORO;   Surgeon: Melrose Nakayama, MD;  Location: [facility];  Service: Thoracic;  Laterality: N/A;   Current Outpatient Medications on File Prior to Visit  Medication Sig Dispense Refill   ALPRAZolam (XANAX) 0.5 MG tablet Take 1 tablet (0.5 mg total) by mouth at bedtime as needed for anxiety. 30 tablet 0   aspirin 81 MG tablet Take 81 mg by mouth every morning.      atorvastatin (LIPITOR) 40 MG tablet Take 1 tablet by mouth every night at bedtime 90 tablet 3   Blood Glucose Monitoring Suppl (ONE TOUCH ULTRA 2) w/Device KIT Use to monitor FSBS 4x daily for fluctuating blood sugars. Dx: E11.65. 1 each 0   Cholecalciferol (VITAMIN D-3 PO) Take 1 capsule by mouth daily.     Cyanocobalamin (VITAMIN B-12 PO) Take 1 tablet by mouth daily.     dabigatran (PRADAXA) 150 MG CAPS capsule Take 150 mg by mouth 2 (two) times daily.     dapagliflozin propanediol (FARXIGA) 10 MG TABS tablet Take 10 mg by mouth daily. 30 tablet 0   diltiazem (CARDIZEM CD) 300 MG 24 hr capsule TAKE 1 CAPSULE BY MOUTH EVERY DAY 90 capsule 2   furosemide (LASIX) 20 MG tablet Take 20 mg by mouth daily.     glucose blood (ONE TOUCH ULTRA TEST) test strip Use to monitor FSBS 4x daily for fluctuating blood sugars. Dx: E11.65. 400 each 2   insulin aspart (NOVOLOG) 100 UNIT/ML injection 15 UNITS WITH BREAKFAST AND 15 UNITS WITH LUNCH, 20 UNITS WITH SUPPER 60 mL 5   Investigational - Study Medication "Title: [id number] (FibroGen Study) is a Phase 3, randomized, double-blind, placebo-controlled multicenter international study to evaluate evaluate the efficacy and safety of 30 mg/kg IV infusions of pamrevlumab administered every 3 weeks for 52 weeks as compared to placebo in subjects with Idiopathic Pulmonary Fibrosis. Primary end point is: change in FVC from baseline at week 52"     LANTUS 100 UNIT/ML injection INJECT 0.6 MLS (60 UNITS TOTAL) INTO THE SKIN AT BEDTIME. 60 mL 3   levothyroxine (SYNTHROID, LEVOTHROID) 125 MCG tablet  Take 1 tablet (125 mcg total) by mouth daily. 90 tablet 2   MAGNESIUM PO Take 1 tablet by mouth daily.     Menthol, Topical Analgesic, (BIOFREEZE EX) Apply 1 application topically as needed (for pain).     metFORMIN (GLUCOPHAGE) 850 MG tablet Take 1 tablet by mouth twice a day 180 tablet 1   nitroGLYCERIN (NITROSTAT) 0.4 MG SL tablet Place 1 tablet (0.4 mg total) under the tongue every 5 (five) minutes as needed for chest pain. 25 tablet 1   omega-3 acid ethyl esters (LOVAZA) 1 g capsule Take 2 capsules (2 g total) by mouth 2 (two) times daily. 360 capsule 3   ONETOUCH DELICA LANCETS FINE MISC Test up to 4 times a day as directed. 100 each 5   OXYGEN Inhale 6-8 L into the lungs continuous.     POTASSIUM PO Take 1 tablet by mouth daily.     SYRINGE-NEEDLE, DISP, 3 ML (B-D SYRINGE/NEEDLE 3CC/23GX1") 23G X 1" 3 ML MISC Use with Testosterone injections 50 each 2   SYRINGE/NEEDLE, DISP, 1 ML (B-D SYRINGE/NEEDLE 1CC/25GX5/8) 25G X 5/8" 1 ML MISC As directed 100 each 5   testosterone cypionate (DEPO-TESTOSTERONE) 100 MG/ML injection 100 mg injected SQ q 10 days 10 mL 2   zolpidem (AMBIEN) 10 MG tablet Take 1 tablet by mouth at bedtime. 90 tablet 1   No  current facility-administered medications on file prior to visit.    Allergies  Allergen Reactions   Niaspan [Niacin] Itching   Ofev [Nintedanib] Other (See Comments)    "Lethargic and wiped out"   Pirfenidone Other (See Comments)    Caused the patient to unexpectedly wet the bed in the middle of the night (was never a problem prior to taking this med)   Ramipril Cough        Social History   Socioeconomic History   Marital status: Single    Spouse name: Not on file   Number of children: 1   Years of education: Not on file   Highest education level: Not on file  Occupational History    Employer: [employer]  Social Needs   Financial resource strain: Not hard at all   Food insecurity:    Worry: Never true     Inability: Never true   Transportation needs:    Medical: No    Non-medical: No  Tobacco Use   Smoking status: Former Smoker    Packs/day: 1.50    Years: 35.00    Pack years: 52.50    Types: Cigarettes    Last attempt to quit: 10/24/2007    Years since quitting: 11.4   Smokeless tobacco: Never Used  Substance and Sexual Activity   Alcohol use: Yes    Alcohol/week: 0.0 standard drinks    Comment: 04/15/2015 "I've had a couple drinks in the last 8 months"   Drug use: No   Sexual activity: Not Currently  Lifestyle   Physical activity:    Days per week: 0 days    Minutes per session: 0 min   Stress: Not at all  Relationships   Social connections:    Talks on phone: Once a week    Gets together: More than three times a week    Attends religious service: Never    Active member of club or organization: No    Attends meetings of clubs or organizations: Never    Relationship status: Not on file   Intimate partner violence:    Fear of current or ex partner: Not on file    Emotionally abused: Not on file    Physically abused: Not on file    Forced sexual activity: Not on file  Other Topics Concern   Not on file  Social History Narrative   Not on file       Review of Systems  All other systems reviewed and are negative.      Objective:   Physical Exam Vitals signs reviewed.  Constitutional:      General: He is not in acute distress.    Appearance: He is well-developed. He is not diaphoretic.  Neck:     Thyroid: No thyromegaly.     Vascular: No JVD.  Cardiovascular:     Rate and Rhythm: Normal rate and regular rhythm.     Pulses: Normal pulses.     Heart sounds: Normal heart sounds, S1 normal and S2 normal. No murmur. No friction rub. No gallop.   Pulmonary:     Effort: No respiratory distress.     Breath sounds: No stridor. Examination of the right-upper field reveals rales. Examination of the left-upper field reveals rales. Examination of the  right-middle field reveals rales. Examination of the left-middle field reveals rales. Examination of the right-lower field reveals rales. Examination of the left-lower field reveals rales. Decreased breath sounds and rales present. No wheezing.  Chest:  Chest wall: No tenderness.  Abdominal:     General: Bowel sounds are normal.     Palpations: Abdomen is soft.  Musculoskeletal:     Right lower leg: Edema present.     Left lower leg: Edema present.      Assessment & Plan:  Acute idiopathic pulmonary fibrosis (HCC)  Hypoxia  DOE (dyspnea on exertion)  Patient has acute on chronic respiratory failure with hypoxia likely due to his underlying acute idiopathic pulmonary fibrosis.  I believe he is having exacerbation of this.  I believe this is likely causing right-sided heart failure explaining the swelling.  Patient needs admission to the hospital and pulmonary consultation as well as imaging of the chest.  I suspect that he needs high-dose IV steroids, respiratory support with possible BiPAP as well as evaluation for any evidence of pulmonary edema or underlying pulmonary infection.  This is beyond what we are able to accomplish in the outpatient setting given his tenuous status.  Therefore the patient will go directly to the hospital via private vehicle with his daughter driving.  The ER of his pending arrival

## 2019-03-18 NOTE — ED Notes (Signed)
This RN acting as nurse navigator and asked pt if he would like for me to contact any family/friends. Pt declined and states he is able to keep them informed.  

## 2019-03-19 ENCOUNTER — Encounter (HOSPITAL_COMMUNITY): Payer: Self-pay | Admitting: General Practice

## 2019-03-19 DIAGNOSIS — R0609 Other forms of dyspnea: Secondary | ICD-10-CM

## 2019-03-19 DIAGNOSIS — I480 Paroxysmal atrial fibrillation: Secondary | ICD-10-CM

## 2019-03-19 DIAGNOSIS — J849 Interstitial pulmonary disease, unspecified: Secondary | ICD-10-CM

## 2019-03-19 DIAGNOSIS — Z7901 Long term (current) use of anticoagulants: Secondary | ICD-10-CM

## 2019-03-19 DIAGNOSIS — N183 Chronic kidney disease, stage 3 (moderate): Secondary | ICD-10-CM

## 2019-03-19 DIAGNOSIS — J9621 Acute and chronic respiratory failure with hypoxia: Secondary | ICD-10-CM

## 2019-03-19 DIAGNOSIS — I5031 Acute diastolic (congestive) heart failure: Secondary | ICD-10-CM

## 2019-03-19 HISTORY — DX: Acute diastolic (congestive) heart failure: I50.31

## 2019-03-19 LAB — BASIC METABOLIC PANEL
Anion gap: 15 (ref 5–15)
BUN: 14 mg/dL (ref 8–23)
CO2: 28 mmol/L (ref 22–32)
Calcium: 8.9 mg/dL (ref 8.9–10.3)
Chloride: 97 mmol/L — ABNORMAL LOW (ref 98–111)
Creatinine, Ser: 1.43 mg/dL — ABNORMAL HIGH (ref 0.61–1.24)
GFR calc Af Amer: 59 mL/min — ABNORMAL LOW (ref >60)
GFR calc non Af Amer: 51 mL/min — ABNORMAL LOW (ref >60)
Glucose, Bld: 167 mg/dL — ABNORMAL HIGH (ref 70–99)
Potassium: 3.1 mmol/L — ABNORMAL LOW (ref 3.5–5.1)
Sodium: 140 mmol/L (ref 135–145)

## 2019-03-19 LAB — LEGIONELLA PNEUMOPHILA SEROGP 1 UR AG: L. pneumophila Serogp 1 Ur Ag: NEGATIVE

## 2019-03-19 LAB — GLUCOSE, CAPILLARY
Glucose-Capillary: 127 mg/dL — ABNORMAL HIGH (ref 70–99)
Glucose-Capillary: 143 mg/dL — ABNORMAL HIGH (ref 70–99)
Glucose-Capillary: 159 mg/dL — ABNORMAL HIGH (ref 70–99)
Glucose-Capillary: 182 mg/dL — ABNORMAL HIGH (ref 70–99)
Glucose-Capillary: 256 mg/dL — ABNORMAL HIGH (ref 70–99)

## 2019-03-19 LAB — PROCALCITONIN: Procalcitonin: <0.1 ng/mL

## 2019-03-19 LAB — TROPONIN I
Troponin I: 0.04 ng/mL (ref <0.03)
Troponin I: 0.03 ng/mL (ref <0.03)

## 2019-03-19 MED ORDER — FUROSEMIDE 40 MG PO TABS
40.0000 mg | ORAL_TABLET | Freq: Two times a day (BID) | ORAL | Status: DC
  Administered 2019-03-20: 40 mg via ORAL
  Filled 2019-03-19: qty 1

## 2019-03-19 MED ORDER — INSULIN ASPART 100 UNIT/ML ~~LOC~~ SOLN
0.0000 [IU] | Freq: Three times a day (TID) | SUBCUTANEOUS | Status: DC
  Administered 2019-03-20: 1 [IU] via SUBCUTANEOUS

## 2019-03-19 MED ORDER — POTASSIUM CHLORIDE CRYS ER 20 MEQ PO TBCR
40.0000 meq | EXTENDED_RELEASE_TABLET | Freq: Two times a day (BID) | ORAL | Status: AC
  Administered 2019-03-19 – 2019-03-20 (×2): 40 meq via ORAL
  Filled 2019-03-19 (×2): qty 2

## 2019-03-19 MED ORDER — FLUCONAZOLE 200 MG PO TABS
200.0000 mg | ORAL_TABLET | Freq: Once | ORAL | Status: DC
  Filled 2019-03-19: qty 1

## 2019-03-19 NOTE — Plan of Care (Signed)
  Problem: Activity: Goal: Risk for activity intolerance will decrease Outcome: Progressing   Problem: Coping: Goal: Level of anxiety will decrease Outcome: Progressing   Problem: Elimination: Goal: Will not experience complications related to bowel motility Outcome: Progressing   Problem: Safety: Goal: Ability to remain free from injury will improve Outcome: Progressing   

## 2019-03-19 NOTE — Progress Notes (Signed)
[facility] PROGRESS NOTE  Jerry Montgomery ZOX:[id number] DOB: March 30, 1956 DOA: 03/18/2019 PCP: Donita Brooks, MD  Assessment/Plan: 1. Acute on chronic hypoxic respiratory failure presumed secondary to progression of underlying disease and volume overload related to pulmonary edema with setting of CHF exacerbation (i.e. decompensation related to pulmonary fibrosis followed by LV decompensation).Doubt infectious process given unremarkable RVP/strep pneumo  Patient is negative almost 2 L on IV Lasix therapy, minimal swelling on exam but still easily dyspneic in conversation we will continue IV Lasix today and transition to oral Lasix on 5/29.  Will need ambulatory oxygen to be ambulatory test and sugars to ensure no increase O2 requirements, currently at baseline 8 L.  Given quite elevated O2 requirements will consult palliative care to assist with outpatient conversations of goals of care is end-of-life discussions. 2. COPD, no acute flare continue home inhalers 3. Atrial fibrillation status post pacemaker, monitor on telemetry continue  diltiazem and Pradaxa 4. CKD stage III, stable continue monitor BMP, avoid nephrotoxins, monitor urine output 5. Type 2 diabetes, controlled, Lantus 60 units, continue sliding scale insulin, monitor CBG holding home oral hypoglycemics 6. Hypothyroidism, stable continue Synthroid. 7. Hyperlipidemia, stable continue home Lipitor  Code Status: DNR Family Communication: No family at bedside (indicate person spoken with, relationship, and if by phone, the number) Disposition Plan: Monitor ins and out after IV diuresis, transition to oral diuretics on 5/2 8 AM, will need ambulatory oxygen test before DC   Consultants:  Pulmonary  Procedures:  None  Antibiotics:  None  HPI/Subjective:  Jerry Montgomery is a 63 y.o. 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 fib/flutter status post  pacemaker, CHF diastolic dysfunction, CKD stage III, COPD, hypothyroidism who presented on 03/18/2019 with progressive dyspnea, lower leg swelling over several days and was found to have acute on chronic hypoxic respiratory failure.  Feels his breathing is back to baseline Denies any chest pain or change in cough Reports improvement in lower leg swelling  Objective: Vitals:   03/19/19 1651 03/19/19 2009  BP: 125/71 112/81  Pulse: 64 60  Resp: 18 18  Temp: 97.8 F (36.6 C) 99.1 F (37.3 C)  SpO2: 94% 93%    Intake/Output Summary (Last 24 hours) at 03/19/2019 2359 Last data filed at 03/19/2019 2141 Gross per 24 hour  Intake 720 ml  Output 3450 ml  Net -2730 ml   Filed Weights   03/18/19 2036 03/19/19 0431  Weight: 102.4 kg 100.5 kg    Exam:   General: Elderly male, lying in bed in no distress  Cardiovascular: Regular rate and rhythm, no significant edema bilaterally  Respiratory: Dyspneic in conversation while on 8 L nasal cannula, no overt accessory muscle usage inspiratory crackles heard in all lung fields  Abdomen: Soft, normal bowel sounds  Musculoskeletal: Normal range of motion  Skin no rashes or lesions  Neurologic alert oriented x4, no appreciable focal deficits  Data Reviewed: Basic Metabolic Panel: Recent Labs  Lab 03/18/19 1124 03/19/19 0911  NA 139 140  K 3.3* 3.1*  CL 101 97*  CO2 22 28  GLUCOSE 71 167*  BUN 16 14  CREATININE 1.46* 1.43*  CALCIUM 8.8* 8.9   Liver Function Tests: Recent Labs  Lab 03/18/19 1124  AST 18  ALT 13  ALKPHOS 86  BILITOT 1.0  PROT 6.5  ALBUMIN 3.2*   No results for input(s): LIPASE, AMYLASE in the last 168 hours. No results for input(s): AMMONIA in  the last 168 hours. CBC: Recent Labs  Lab 03/18/19 1124  WBC 7.8  NEUTROABS 6.3  HGB 13.3  HCT 44.0  MCV 84.5  PLT 233   Cardiac Enzymes: Recent Labs  Lab 03/18/19 1124 03/18/19 2109 03/19/19 0327 03/19/19 0911  TROPONINI 0.03* 0.04* 0.04* 0.03*    BNP (last 3 results) Recent Labs    10/10/18 1211 11/19/18 1134 03/18/19 1124  BNP 282.9* 242* 476.9*    ProBNP (last 3 results) No results for input(s): PROBNP in the last 8760 hours.  CBG: Recent Labs  Lab 03/18/19 2358 03/19/19 0615 03/19/19 1114 03/19/19 1611 03/19/19 2107  GLUCAP 256* 143* 127* 182* 159*    Recent Results (from the past 240 hour(s))  SARS Coronavirus 2 (CEPHEID- Performed in [facility] hospital lab), Hosp Order     Status: None   Collection Time: 03/18/19 12:58 PM  Result Value Ref Range Status   SARS Coronavirus 2 NEGATIVE NEGATIVE Final    Comment: (NOTE) If result is NEGATIVE SARS-CoV-2 target nucleic acids are NOT DETECTED. The SARS-CoV-2 RNA is generally detectable in upper and lower  respiratory specimens during the acute phase of infection. The lowest  concentration of SARS-CoV-2 viral copies this assay can detect is 250  copies / mL. A negative result does not preclude SARS-CoV-2 infection  and should not be used as the sole basis for treatment or other  patient management decisions.  A negative result may occur with  improper specimen collection / handling, submission of specimen other  than nasopharyngeal swab, presence of viral mutation(s) within the  areas targeted by this assay, and inadequate number of viral copies  (<250 copies / mL). A negative result must be combined with clinical  observations, patient history, and epidemiological information. If result is POSITIVE SARS-CoV-2 target nucleic acids are DETECTED. The SARS-CoV-2 RNA is generally detectable in upper and lower  respiratory specimens dur ing the acute phase of infection.  Positive  results are indicative of active infection with SARS-CoV-2.  Clinical  correlation with patient history and other diagnostic information is  necessary to determine patient infection status.  Positive results do  not rule out bacterial infection or co-infection with other viruses. If  result is PRESUMPTIVE POSTIVE SARS-CoV-2 nucleic acids MAY BE PRESENT.   A presumptive positive result was obtained on the submitted specimen  and confirmed on repeat testing.  While 2019 novel coronavirus  (SARS-CoV-2) nucleic acids may be present in the submitted sample  additional confirmatory testing may be necessary for epidemiological  and / or clinical management purposes  to differentiate between  SARS-CoV-2 and other Sarbecovirus currently known to infect humans.  If clinically indicated additional testing with an alternate test  methodology [phone]) is advised. The SARS-CoV-2 RNA is generally  detectable in upper and lower respiratory sp ecimens during the acute  phase of infection. The expected result is Negative. Fact Sheet for Patients:  BoilerBrush.com.cy Fact Sheet for Healthcare Providers: https://pope.com/ This test is not yet approved or cleared by the [place] FDA and has been authorized for detection and/or diagnosis of SARS-CoV-2 by FDA under an Emergency Use Authorization (EUA).  This EUA will remain in effect (meaning this test can be used) for the duration of the COVID-19 declaration under Section 564(b)(1) of the Act, 21 U.S.C. section 360bbb-3(b)(1), unless the authorization is terminated or revoked sooner. Performed at [facility], [address] [place], [place] [address]   Blood culture (routine x 2)     Status:  None (Preliminary result)   Collection Time: 03/18/19  2:40 PM  Result Value Ref Range Status   Specimen Description BLOOD RIGHT HAND  Final   Special Requests   Final    BOTTLES DRAWN AEROBIC AND ANAEROBIC Blood Culture adequate volume   Culture   Final    NO GROWTH < 24 HOURS Performed at [facility], [address], [place], [place] [address]    Report Status PENDING  Incomplete  Blood culture (routine x 2)     Status: None (Preliminary result)   Collection Time: 03/18/19   2:48 PM  Result Value Ref Range Status   Specimen Description BLOOD RIGHT ARM  Final   Special Requests   Final    BOTTLES DRAWN AEROBIC AND ANAEROBIC Blood Culture adequate volume   Culture   Final    NO GROWTH < 24 HOURS Performed at [facility], [address] [place], [place] [address]    Report Status PENDING  Incomplete  Respiratory Panel by PCR     Status: None   Collection Time: 03/18/19  6:13 PM  Result Value Ref Range Status   Adenovirus NOT DETECTED NOT DETECTED Final   Coronavirus 229E NOT DETECTED NOT DETECTED Final    Comment: (NOTE) The Coronavirus on the Respiratory Panel, DOES NOT test for the novel  Coronavirus (2019 nCoV)    Coronavirus HKU1 NOT DETECTED NOT DETECTED Final   Coronavirus NL63 NOT DETECTED NOT DETECTED Final   Coronavirus OC43 NOT DETECTED NOT DETECTED Final   Metapneumovirus NOT DETECTED NOT DETECTED Final   Rhinovirus / Enterovirus NOT DETECTED NOT DETECTED Final   Influenza A NOT DETECTED NOT DETECTED Final   Influenza B NOT DETECTED NOT DETECTED Final   Parainfluenza Virus 1 NOT DETECTED NOT DETECTED Final   Parainfluenza Virus 2 NOT DETECTED NOT DETECTED Final   Parainfluenza Virus 3 NOT DETECTED NOT DETECTED Final   Parainfluenza Virus 4 NOT DETECTED NOT DETECTED Final   Respiratory Syncytial Virus NOT DETECTED NOT DETECTED Final   Bordetella pertussis NOT DETECTED NOT DETECTED Final   Chlamydophila pneumoniae NOT DETECTED NOT DETECTED Final   Mycoplasma pneumoniae NOT DETECTED NOT DETECTED Final    Comment: Performed at [facility], [address] [place], [place] [address]     Studies: Dg Chest Portable 1 View  Result Date: 03/18/2019 CLINICAL DATA:  Shortness of breath. EXAM: PORTABLE CHEST 1 VIEW COMPARISON:  Radiographs of November 20, 2018. FINDINGS: Stable cardiomediastinal silhouette. Left-sided pacemaker is unchanged in position. No pneumothorax is noted. Stable bilateral lung opacities are noted most consistent  with multifocal pneumonia. Small bilateral pleural effusions are noted, right greater than left. Bony thorax is unremarkable. IMPRESSION: Stable bilateral lung opacities are noted most consistent with multifocal pneumonia. Small bilateral pleural effusions are noted. Electronically Signed   By: Lupita Raider M.D.   On: 03/18/2019 11:52    Scheduled Meds: . atorvastatin  40 mg Oral QHS  . dabigatran  150 mg Oral BID  . diltiazem  300 mg Oral Daily  . [START ON 03/20/2019] furosemide  40 mg Oral BID  . insulin aspart  0-9 Units Subcutaneous TID WC  . insulin glargine  60 Units Subcutaneous QHS  . levothyroxine  125 mcg Oral Daily  . omega-3 acid ethyl esters  2 g Oral BID  . potassium chloride  40 mEq Oral BID  . zolpidem  10 mg Oral QHS   Continuous Infusions:  Active Problems:  Congestive heart failure (CHF) (HCC)      Laverna PeaceShayla D Nettey  [facility]

## 2019-03-19 NOTE — Progress Notes (Addendum)
NAME:  Jerry NewtonRobert S Bole, MRN:  [id number], DOB:  08/22/1956, LOS: 1 ADMISSION DATE:  03/18/2019, CONSULTATION DATE:  5/26 REFERRING MD: isaacs , CHIEF COMPLAINT:  Acute on chronic hypoxic respiratory failure   Brief History   63 year old male patient chronic respiratory failure setting of IPF.  Being admitted with acute on chronic respiratory failure secondary to decompensated biventricular heart failure  Hospital course   5/26 admitted. PCCM consulted. IV lasix started  5/27 refused BIPAP. fluid balance 1.9 liters neg. Feels better. 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  Type 2 diabetes -> Per discussion with Dr. [name] has had declining pulmonary function over the last 6 to 9 months requiring now 8 L oxygen and had previously been on 6. -> Currently enrolled in pulmonary fibrosis study,  Significant Hospital Events     Consults:    Procedures:    Significant Diagnostic Tests:  ECHO 5/26>>>  Micro Data:  COVID -19 5/26: neg   Antimicrobials:    Interim history/subjective:  Refused BIPAP last night. Not a surprise as he said he didn't want it.   Objective   Blood pressure 106/67, pulse 69, temperature 98.4 F (36.9 C), temperature source Oral, resp. rate 18, height 6\' 1"  (1.854 m), weight 100.5 kg, SpO2 92 %.        Intake/Output Summary (Last 24 hours) at 03/19/2019 1122 Last data filed at 03/19/2019 1009 Gross per 24 hour  Intake 730 ml  Output 2700 ml  Net -1970 ml   Filed Weights   03/18/19 2036 03/19/19 0431  Weight: 102.4 kg 100.5 kg    Examination:  General - 63 year old male lying in bed. No acute distress HENT NCAT no JVD MMM pulm basilar rales some accessory use  Card irreg  irreg no Murmur abd not tender  Ext still w/ pitting edema Neuro intact   Resolved Hospital Problem list     Assessment & Plan:  Acute on chronic Hypoxic respiratory failure in setting of bilateral pulm infiltrates. Most likely a mix of progression of his underlying ILD and probably volume overload/pulmonary edema.  -This is not consistent with IPF flare but more likely just progression of underlying disease and ultimately decompensation of his cardiopulmonary system ->negative 1.9 liters ->respiratory effort: minimally improved but improved  ->oxygen requirements: 8 liters ->renal fxn tolerating lasix  Plan Cont oxygen We need to ensure we check walking oximetry prior to dc to determine if home O2 currently on is enough; I suspect he needs more.  Defer research trial decisions to Barbie Croston at dc   H/o COPD (FEV1 62% predicted) Plan Cont BDs  Acute on chronic biventricular HF w/ both systolic and diastolic dysfxn and  pulmonary edema.  -suspect progressive hypoxia, f/b RV decompensation then LV decompensation Plan Lasix as tol   H/o afib; has PPM Plan Tele, BB and pradaxa  CKD stage III Plan Am chem  DM type II and hypothyroidism  Plan ssi & synthroid.   Best practice:  Diet: As tolerated Pain/Anxiety/Delirium protocol (if indicated): Not indicated VAP protocol (if indicated): Not indicated DVT prophylaxis: To be determined GI prophylaxis: Not indicated Glucose control: Sliding scale insulin Mobility: As tolerated Code Status: DNR, DNI, no BiPAP Family Communication: Pending Disposition: cont  IV lasix; MUST check walking oximetry prior to dc   Simonne Martinet ACNP-BC [facility] Pulmonary/Critical Care Pager # [phone] OR # [phone] if no answer    ATTESTATION & SIGNATURE   STAFF NOTE: I, Dr Lavinia Sharps have personally reviewed patient's available data, including medical history, events of note, physical examination and test results as part of my evaluation. I  have discussed with resident/NP and other care providers such as pharmacist, RN and RRT.  In addition,  I personally evaluated patient and elicited key findings of   S: Date of admit 03/18/2019 with LOS 1 for today 03/19/2019 : Jerry Montgomery is  - feeling better with diurses. WAnts to go home tomorrow. On 8L [place]. REfuses home hospice (says wife available)Says he has progressive terminal disease but wants to live long enough to give DR [name] "Trouble" (he was kidding). Says he DOES NOT want to stop Research infusion - scheduled approx 03/27/2019. Says he needs to contribute to science.   O:  Blood pressure 125/71, pulse 64, temperature 97.8 F (36.6 C), temperature source Oral, resp. rate 18, height 6\' 1"  (1.854 m), weight 100.5 kg, SpO2 94 %.   8L [place] 3-4 dyspnea Some crackles Edema better   LABS   See above  A:  acute on chronic systolic chf -= better with lasix SEvere IPF - progressive over time but not n IPF Flare Acute on Chronic hypoxemic resp failure due to above - seems stable toiday on 8L - baseline WEight loss due to above Diflucan x 1 dose - ? why  Above not related to study drug in my opinion  P: O2 Lasix Diurese Get ECHO Offered home hospice- refused Ok for research infusions q3 weeks Check mag (known to have hypomagnesemia at home)  PCCM will round again 03/20/19 x 1 and sign off   Anti-infectives (From admission, onward)   Start     Dose/Rate Route Frequency Ordered Stop   03/19/19 1800  fluconazole (DIFLUCAN) tablet 200 mg     200 mg Oral  Once 03/19/19 1758     03/18/19 1415  cefTRIAXone (ROCEPHIN) 2 g in sodium chloride 0.9 % 100 mL IVPB     2 g 200 mL/hr over 30 Minutes Intravenous  Once 03/18/19 1414 03/18/19 1617   03/18/19 1415  doxycycline (VIBRAMYCIN) 100 mg in sodium chloride 0.9 % 250 mL IVPB     100 mg 125 mL/hr over 120 Minutes Intravenous  Once 03/18/19 1414 03/18/19 1801       Rest per NP/medical resident whose note is outlined  above and that I agree with    Dr. Kalman Shan, M.D., [facility].C.P Pulmonary and Critical Care Medicine Staff Physician [facility] Pulmonary and Critical Care Pager: [phone], If no answer or between  15:00h - 7:00h: call [phone]  03/19/2019 6:20 PM

## 2019-03-20 ENCOUNTER — Inpatient Hospital Stay (HOSPITAL_COMMUNITY): Payer: PPO

## 2019-03-20 DIAGNOSIS — I361 Nonrheumatic tricuspid (valve) insufficiency: Secondary | ICD-10-CM

## 2019-03-20 DIAGNOSIS — I371 Nonrheumatic pulmonary valve insufficiency: Secondary | ICD-10-CM

## 2019-03-20 DIAGNOSIS — I2781 Cor pulmonale (chronic): Secondary | ICD-10-CM

## 2019-03-20 DIAGNOSIS — I50813 Acute on chronic right heart failure: Secondary | ICD-10-CM

## 2019-03-20 DIAGNOSIS — I5031 Acute diastolic (congestive) heart failure: Secondary | ICD-10-CM

## 2019-03-20 DIAGNOSIS — J9611 Chronic respiratory failure with hypoxia: Secondary | ICD-10-CM

## 2019-03-20 LAB — ECHOCARDIOGRAM COMPLETE
Height: 73 in
Weight: 3382.4 oz

## 2019-03-20 LAB — BASIC METABOLIC PANEL
Anion gap: 12 (ref 5–15)
BUN: 10 mg/dL (ref 8–23)
CO2: 31 mmol/L (ref 22–32)
Calcium: 8.8 mg/dL — ABNORMAL LOW (ref 8.9–10.3)
Chloride: 99 mmol/L (ref 98–111)
Creatinine, Ser: 1.26 mg/dL — ABNORMAL HIGH (ref 0.61–1.24)
GFR calc Af Amer: >60 mL/min (ref >60)
GFR calc non Af Amer: 59 mL/min — ABNORMAL LOW (ref >60)
Glucose, Bld: 196 mg/dL — ABNORMAL HIGH (ref 70–99)
Potassium: 3.3 mmol/L — ABNORMAL LOW (ref 3.5–5.1)
Sodium: 142 mmol/L (ref 135–145)

## 2019-03-20 LAB — PHOSPHORUS: Phosphorus: 2.4 mg/dL — ABNORMAL LOW (ref 2.5–4.6)

## 2019-03-20 LAB — GLUCOSE, CAPILLARY
Glucose-Capillary: 79 mg/dL (ref 70–99)
Glucose-Capillary: 143 mg/dL — ABNORMAL HIGH (ref 70–99)
Glucose-Capillary: 141 mg/dL — ABNORMAL HIGH (ref 70–99)

## 2019-03-20 LAB — MAGNESIUM: Magnesium: 1.2 mg/dL — ABNORMAL LOW (ref 1.7–2.4)

## 2019-03-20 LAB — PROCALCITONIN: Procalcitonin: <0.1 ng/mL

## 2019-03-20 MED ORDER — FUROSEMIDE 20 MG PO TABS: 40.0000 mg | ORAL_TABLET | Freq: Two times a day (BID) | ORAL | 1 refills | Status: DC

## 2019-03-20 NOTE — Progress Notes (Signed)
NAME:  Jerry Montgomery, MRN:  [id number], DOB:  03/06/1956, LOS: 2 ADMISSION DATE:  03/18/2019, CONSULTATION DATE:  5/26 REFERRING MD: isaacs , CHIEF COMPLAINT:  Acute on chronic hypoxic respiratory failure   Brief History   63 year old male patient chronic respiratory failure setting of IPF.  Being admitted with acute on chronic respiratory failure secondary to decompensated biventricular heart failure  Hospital course   5/26 admitted. PCCM consulted. IV lasix started  5/27 refused BIPAP. fluid balance 1.9 liters neg. Feels better. 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  Type 2 diabetes -> Per discussion with Dr. [name] has had declining pulmonary function over the last 6 to 9 months requiring now 8 L oxygen and had previously been on 6. -> Currently enrolled in pulmonary fibrosis study,  Significant Hospital Events     Consults:    Procedures:    Significant Diagnostic Tests:  ECHO 5/26>>>  Micro Data:  COVID -19 5/26: neg   Antimicrobials:    Interim history/subjective:  Feels better  Objective   Blood pressure 119/67, pulse 61, temperature 98.1 F (36.7 C), temperature source Oral, resp. rate 20, height 6\' 1"  (1.854 m), weight 95.9 kg, SpO2 95 %.        Intake/Output Summary (Last 24 hours) at 03/20/2019 1009 Last data filed at 03/20/2019 0724 Gross per 24 hour  Intake 480 ml  Output 1750 ml  Net -1270 ml   Filed Weights   03/18/19 2036 03/19/19 0431 03/20/19 0442  Weight: 102.4 kg 100.5 kg 95.9 kg    Examination:  General 63 year old male patient chronically ill he is lying in bed and in no acute distress today he actually looks better when comparing from previous 2 days HEENT  normocephalic atraumatic no jugular venous distention Pulmonary: Dry rales posteriorly no accessory use Cardiac: Regular irregular Abdomen: Soft nontender no organomegaly Extremities: Warm and dry brisk capillary refill, has pitting edema lower extremities GU: Clear yellow Neuro: Intact   Resolved Hospital Problem list     Assessment & Plan:  Acute on chronic Hypoxic respiratory failure in setting of bilateral pulm infiltrates. Most likely a mix of progression of his underlying ILD and probably volume overload/pulmonary edema.  -This is not consistent with IPF flare but more likely just progression of underlying disease and ultimately decompensation of his cardiopulmonary system -> He is now -3.2 L -> He is back on 8 L and feels ready for discharge Plan Continue supplemental oxygen Will order walking oximetry, need to determine if he actually needs more oxygen with activity, he has an Oxymizer already at home, he may need to be using this He prefers to see his PCP for most of his care, we will be available as needed in the outpatient setting  H/o COPD (FEV1 62% predicted) Plan Continue bronchodilators  Acute on chronic biventricular HF w/ both systolic and diastolic dysfxn and  pulmonary edema.  -suspect progressive hypoxia, f/b RV decompensation then LV decompensation Plan Transitioning to oral Lasix  H/o afib; has PPM Plan Beta-blocker and Pradaxa  CKD stage III Plan Awaiting chemistry  DM type II and hypothyroidism  Plan Continuing sliding scale and Synthroid  Best practice:  Diet: As tolerated Pain/Anxiety/Delirium protocol (if indicated): Not indicated VAP protocol (  if indicated): Not indicated DVT prophylaxis: To be determined GI prophylaxis: Not indicated Glucose control: Sliding scale insulin Mobility: As tolerated Code Status: DNR, DNI, no BiPAP Family Communication: Pending Disposition: Should be ready for discharge we will sign off  Simonne MartinetPeter E Legrand Lasser  ACNP-BC [facility] Pulmonary/Critical Care Pager # [phone] OR # [phone] if no answer     03/20/2019 10:09 AM

## 2019-03-20 NOTE — Progress Notes (Signed)
SATURATION QUALIFICATIONS: (This note is used to comply with regulatory documentation for home oxygen)  Patient Saturations on room air at Rest = patient desats quickly below 88%  Patient Saturations on room air while Ambulating = not tested  Patient Saturations on 8 Liters of oxygen at rest = 94%  Patient Saturations on 8 Liters of oxygen while Ambulating = 92%  Please briefly explain why patient needs home oxygen: Patient maintained sats at or above 90% on 8L O2 while ambulating, Lowest O2 sat with activity on 8L O2 was 90%

## 2019-03-20 NOTE — TOC Initial Note (Signed)
Transition of Care [facility](TOC) - Initial/Assessment Note    Patient Details  Name: Jerry NewtonRobert S Montgomery MRN: [id number] Date of Birth: 08/03/1956  Transition of Care [facility](TOC) CM/SW Contact:    Reola MosherChandler, Hayle Parisi L, RN,MHA,BSN Phone Number: [phone] 03/20/2019, 11:13 AM  Clinical Narrative:                 Patient plans to return home with Outpatient Palliative Care services; CM talked to patient, he chose [facility]; Sherri with Palliative care called for arrangements. CM will continue to follow for progression of care.  Expected Discharge Plan: Home/Self Care Barriers to Discharge: No Barriers Identified   Patient Goals and CMS Choice Patient states their goals for this hospitalization and ongoing recovery are:: to go back home and stay there CMS Medicare.gov Compare Post Acute Care list provided to:: Patient    Expected Discharge Plan and Services Expected Discharge Plan: Home/Self Care In-house Referral: NA Discharge Planning Services: CM Consult Post Acute Care Choice: NA Living arrangements for the past 2 months: Single Family Home                 DME Arranged: N/A DME Agency: NA       HH Arranged: NA HH Agency: [facility]patient [facility]iative [facility] with [facility]) Date HH Agency Contacted: 03/20/19 Time HH Agency Contacted: 1112 Representative spoke with at [facility] Agency: Sherri   Prior Living Arrangements/Services Living arrangements for the past 2 months: Single Family Home     Do you feel safe going back to the place where you live?: Yes      Need for Family Participation in Patient Care: Yes (Comment) Care giver support system in place?: Yes (comment)   Criminal Activity/Legal Involvement Pertinent to Current Situation/Hospitalization: No - Comment as needed  Activities of Daily Living Home Assistive Devices/Equipment: Eyeglasses ADL Screening (condition at time of admission) Patient's cognitive ability adequate to safely complete  daily activities?: Yes Is the patient deaf or have difficulty hearing?: Yes Does the patient have difficulty seeing, even when wearing glasses/contacts?: No Does the patient have difficulty concentrating, remembering, or making decisions?: No Patient able to express need for assistance with ADLs?: Yes Does the patient have difficulty dressing or bathing?: No Independently performs ADLs?: Yes (appropriate for developmental age) Does the patient have difficulty walking or climbing stairs?: No Weakness of Legs: None Weakness of Arms/Hands: None  Permission Sought/Granted Permission sought to share information with : Case Manager Permission granted to share information with : Yes, Verbal Permission Granted     Permission granted to share info w AGENCY: Hospice agency        Emotional Assessment Appearance:: Developmentally appropriate Attitude/Demeanor/Rapport: Gracious Affect (typically observed): Accepting Orientation: : Oriented to Self, Oriented to  Time, Oriented to Place, Oriented to Situation Alcohol / Substance Use: Not Applicable Psych Involvement: No (comment)  Admission diagnosis:  Acute on chronic diastolic congestive heart failure (HCC) [I50.33] Acute on chronic respiratory failure with hypoxemia (HCC) [J96.21] Patient Active Problem List   Diagnosis Date Noted  . Congestive heart failure (CHF) (HCC) 03/18/2019  . Paroxysmal atrial fibrillation (HCC) 10/28/2018  . CAD S/P percutaneous coronary angioplasty 07/24/2018  . Chronic anticoagulation 07/24/2018  . Hypotension 07/19/2017  . Bilateral sensorineural hearing loss 04/26/2017  . Right ear impacted cerumen 04/26/2017  . Research subject 06/27/2016  . Obesity 06/27/2016  . Other fatigue 09/14/2015  . Pulmonary fibrosis (HCC)   . 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 Chronic respiratory failure with  hypoxia (HCC) 03/09/2015  . Chronic renal disease, stage III (HCC) 03/09/2015  . Other emphysema (HCC) 03/09/2015  . Insulin  dependent diabetes mellitus (HCC) 02/05/2015  . Atrial fibrillation with RVR (HCC)   . Acute diastolic heart failure (HCC)   . Hypokalemia   . IPF (idiopathic pulmonary fibrosis) (HCC)   . History of PSVT (paroxysmal supraventricular tachycardia) 02/02/2015  . Bradycardia 05/05/2014  . Hypoxemia 08/06/2013  . Need for immunization against influenza 08/06/2013  . ILD (interstitial lung disease) (HCC) 07/08/2013  . Dyspnea 07/08/2013  . OSA (obstructive sleep apnea) 07/08/2013  . Pulmonary emphysema (HCC) 07/08/2013  . Chest wall pain 06/17/2013  . Chondral lesion 01/22/2013  . Cough 03/22/2012  . Pneumonia, organism unspecified(486) 03/07/2012  . DIABETES MELLITUS, TYPE II 02/23/2010  . COPD II 02/23/2010  . HYPERLIPIDEMIA 02/22/2010  . Essential hypertension 02/22/2010  . BRONCHIECTASIS 02/22/2010  . PULMONARY FIBROSIS 02/22/2010   PCP:  Donita Brooks, MD Pharmacy:   [facility] [place] - [address] [place] [address] Phone: [phone] Fax: [phone]  [id number] [place], [place] - [address] [place] [address] Phone: [phone] Fax: [phone]     Social Determinants of Health (SDOH) Interventions    Readmission Risk Interventions No flowsheet data found.

## 2019-03-20 NOTE — Discharge Summary (Addendum)
Discharge Summary  Jerry Montgomery PVX:[id number] DOB: 1956-03-28  PCP: Donita Brooks, MD  Admit date: 03/18/2019 Discharge date: 03/20/2019   Time spent: < 25 minutes  Admitted From: Home Disposition: Home  Recommendations for Outpatient Follow-up:  1. Follow up with PCP in 1 week, repeat BMP, mag 2. Medications: Increase Lasix to 40 mg twice daily, take oral magnesium and potassium 3. Outpatient consultation with palliative medicine with [facility] arranged 4.     Discharge Diagnoses:  Active Hospital Problems   Diagnosis Date Noted  . Congestive heart failure (CHF) (HCC) 03/18/2019  . Paroxysmal atrial fibrillation (HCC) 10/28/2018  . Chronic anticoagulation 07/24/2018  . Chronic renal disease, stage III (HCC) 03/09/2015  . Chronic respiratory failure with hypoxia (HCC) 03/09/2015  . Other emphysema (HCC) 03/09/2015  . Acute diastolic heart failure (HCC)   . IPF (idiopathic pulmonary fibrosis) (HCC)   . Dyspnea 07/08/2013  . ILD (interstitial lung disease) (HCC) 07/08/2013  . Essential hypertension 02/22/2010    Resolved Hospital Problems  No resolved problems to display.    Discharge Condition: Guarded  CODE STATUS: DNR Diet recommendation:    Vitals:   03/20/19 0442 03/20/19 1126  BP: 119/67 129/76  Pulse: 61 61  Resp: 20 19  Temp: 98.1 F (36.7 C) 97.8 F (36.6 C)  SpO2: 95% 93%    History of present illness:  Jerry Montgomery is a 63 y.o. 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 fib/flutter status post pacemaker, CHF diastolic dysfunction, CKD stage III, COPD, hypothyroidism who presented on 03/18/2019 with progressive dyspnea, lower leg swelling over several days and was found to have acute on chronic hypoxic respiratory failure. Hospital course addressed in problem based format below:   Hospital Course:   1. Acute on chronic hypoxic respiratory failure presumed secondary  to progression of underlying IPF disease and associated volume overload related to acute right sided heart failure with cor pulmonale(i.e. decompensation related to pulmonary fibrosis) .Doubt infectious process given unremarkable RVP/strep pneumo.  TTE showed preserved EF with signs of right ventricular overload presume related to pulmonary hypertension from pulmonary fibrosis.  Patient is back to baseline conversational dyspnea and down 3L after IVLasix therapy, with no peripheral edema.  Pulmonary agrees with continuing oral Lasix, will discharge on increased regimen of 40 mg twice daily (previously on 20 mg daily before admission), in addition to continue magnesium (1.2 on discharge, patient elected to continue oral supplementation instead of IV) and potassium supplementation with close follow-up with PCP within a week.   2. Chronic hypoxic respiratory failure in setting of underlying ILD/pulmonary fibrosis.  Pulmonary does not believe this is consistent with an IPF flare but more in line with the progression of his underlying disease in the setting of decompensation from CHF as mentioned above.  Was able to maintain normal oxygen saturation on ambulatory oxygen test prior to discharge, continue 8 L nasal cannula (baseline O2 requirements).  Given quite elevated O2 requirements and concern for progression of lung disease palliative care to assist with outpatient conversations of goals of care ([facility]).  3. COPD, no acute flare continue home inhalers  4. Atrial fibrillation status post pacemaker, continue  diltiazem and Pradaxa  5. CKD stage III, stable. Creatinine 1.2 on discharge. Repeat BMP on PCP check recommended while on increased lasix.    6. Type 2 diabetes, controlled, Lantus 60 units, continue sliding scale insulin, monitor CBG holding home oral  hypoglycemics  7. Hypothyroidism, stable continue Synthroid.  8. Hyperlipidemia, stable continue home Lipitor   Consultations:   Pulmonology  Procedures/Studies: TTE< 03/20/19 EF 60-65%, right ventricular volume or pressure overload, RVSP 66 mmHg with moderate reduced systolic function right ventricle indeterminate left ventricular diastolic parameters   The left ventricle has normal systolic function with an ejection fraction of 60-65%. The cavity size was normal. Left ventricular diastolic Doppler parameters are indeterminate. There is right ventricular volume and pressure overload. No evidence of  left ventricular regional wall motion abnormalities.  2. The right ventricle has moderately reduced systolic function. The cavity was normal. There is mildly increased right ventricular wall thickness. Right ventricular systolic pressure is moderately elevated with an estimated pressure of 66.8 mmHg.  3. Left atrial size was mild-moderately dilated.  4. Right atrial size was mildly dilated.  5. There is mild mitral annular calcification present.  6. The aortic valve is tricuspid. Moderate thickening of the aortic valve. Moderate calcification of the aortic valve. Aortic valve regurgitation was not assessed by color flow Doppler.  7. The inferior vena cava was dilated in size with <50% respiratory variability.  Discharge Exam: BP 129/76 (BP Location: Right Arm)   Pulse 61   Temp 97.8 F (36.6 C) (Oral)   Resp 19   Ht  (1.854 m)   Wt 95.9 kg   SpO2 93%   BMI 27.89 kg/m    General: Elderly male, lying in bed in no distress  Cardiovascular: Regular rate and rhythm, no edema   Respiratory: Dyspneic in conversation while on 8 L nasal cannula, no accessory muscle, usage inspiratory crackles heard in all lung fields  Abdomen: Soft, normal bowel sounds  Musculoskeletal: Normal range of motion  Skin no rashes or lesions  Neurologic alert oriented x4, no appreciable focal deficits   Discharge Instructions You were cared for by a hospitalist during your hospital stay. If you have any questions about your  discharge medications or the care you received while you were in the hospital after you are discharged, you can call the unit and asked to speak with the hospitalist on call if the hospitalist that took care of you is not available. Once you are discharged, your primary care physician will handle any further medical issues. Please note that NO REFILLS for any discharge medications will be authorized once you are discharged, as it is imperative that you return to your primary care physician (or establish a relationship with a primary care physician if you do not have one) for your aftercare needs so that they can reassess your need for medications and monitor your lab values.  Discharge Instructions    Diet - low sodium heart healthy   Complete by:  As directed    Increase activity slowly   Complete by:  As directed      Allergies as of 03/20/2019      Reactions   Niaspan [niacin] Itching   Ofev [nintedanib] Other (See Comments)   "Lethargic and wiped out"   Pirfenidone Other (See Comments)   Caused the patient to unexpectedly wet the bed in the middle of the night (was never a problem prior to taking this med)   Ramipril Cough         Medication List    STOP taking these medications   insulin detemir 100 UNIT/ML injection Commonly known as:  LEVEMIR     TAKE these medications   ALPRAZolam 0.5 MG tablet Commonly known as:  Xanax Take  1 tablet (0.5 mg total) by mouth at bedtime as needed for anxiety.   aspirin 81 MG tablet Take 81 mg by mouth every morning.   atorvastatin 40 MG tablet Commonly known as:  LIPITOR Take 1 tablet by mouth every night at bedtime   BIOFREEZE EX Apply 1 application topically as needed (for pain).   dabigatran 150 MG Caps capsule Commonly known as:  PRADAXA Take 150 mg by mouth 2 (two) times daily.   dapagliflozin propanediol 10 MG Tabs tablet Commonly known as:  Farxiga Take 10 mg by mouth daily.   diltiazem 300 MG 24 hr capsule Commonly  known as:  CARDIZEM CD TAKE 1 CAPSULE BY MOUTH EVERY DAY What changed:  how much to take   furosemide 20 MG tablet Commonly known as:  LASIX Take 2 tablets (40 mg total) by mouth 2 (two) times daily. What changed:    how much to take  when to take this   insulin aspart 100 UNIT/ML injection Commonly known as:  NovoLOG 15 UNITS WITH BREAKFAST AND 15 UNITS WITH LUNCH, 20 UNITS WITH SUPPER   Investigational - Study Medication "Title: [id number] (FibroGen Study) is a Phase 3, randomized, double-blind, placebo-controlled multicenter international study to evaluate evaluate the efficacy and safety of 30 mg/kg IV infusions of pamrevlumab administered every 3 weeks for 52 weeks as compared to placebo in subjects with Idiopathic Pulmonary Fibrosis. Primary end point is: change in FVC from baseline at week 52"   Lantus 100 UNIT/ML injection Generic drug:  insulin glargine INJECT 0.6 MLS (60 UNITS TOTAL) INTO THE SKIN AT BEDTIME. What changed:  See the new instructions.   levothyroxine 125 MCG tablet Commonly known as:  SYNTHROID Take 1 tablet (125 mcg total) by mouth daily.   MAGNESIUM PO Take 1 tablet by mouth daily.   metFORMIN 850 MG tablet Commonly known as:  GLUCOPHAGE Take 1 tablet by mouth twice a day   metoprolol tartrate 100 MG tablet Commonly known as:  LOPRESSOR Take 100 mg by mouth 2 (two) times daily.   nitroGLYCERIN 0.4 MG SL tablet Commonly known as:  NITROSTAT Place 1 tablet (0.4 mg total) under the tongue every 5 (five) minutes as needed for chest pain.   omega-3 acid ethyl esters 1 g capsule Commonly known as:  LOVAZA Take 2 capsules (2 g total) by mouth 2 (two) times daily.   OXYGEN Inhale 6-8 L into the lungs continuous.   POTASSIUM PO Take 1 tablet by mouth daily.   VITAMIN B-12 PO Take 1 tablet by mouth daily.   VITAMIN D-3 PO Take 1 capsule by mouth daily.   zolpidem 10 MG tablet Commonly known as:  AMBIEN Take 1 tablet by mouth at  bedtime.      Allergies  Allergen Reactions  . Niaspan [Niacin] Itching  . Ofev [Nintedanib] Other (See Comments)    "Lethargic and wiped out"  . Pirfenidone Other (See Comments)    Caused the patient to unexpectedly wet the bed in the middle of the night (was never a problem prior to taking this med)  . Ramipril Cough        Follow-up Information    [facility]   Why:  They will do your Outpatient Palliative Care at your home Contact information: [address] [place] [address] [phone]            The results of significant diagnostics from this hospitalization (including imaging, microbiology, ancillary and laboratory) are  listed below for reference.    Significant Diagnostic Studies: Dg Chest Portable 1 View  Result Date: 03/18/2019 CLINICAL DATA:  Shortness of breath. EXAM: PORTABLE CHEST 1 VIEW COMPARISON:  Radiographs of November 20, 2018. FINDINGS: Stable cardiomediastinal silhouette. Left-sided pacemaker is unchanged in position. No pneumothorax is noted. Stable bilateral lung opacities are noted most consistent with multifocal pneumonia. Small bilateral pleural effusions are noted, right greater than left. Bony thorax is unremarkable. IMPRESSION: Stable bilateral lung opacities are noted most consistent with multifocal pneumonia. Small bilateral pleural effusions are noted. Electronically Signed   By: Lupita RaiderJames  Green Jr M.D.   On: 03/18/2019 11:52    Microbiology: Recent Results (from the past 240 hour(s))  SARS Coronavirus 2 (CEPHEID- Performed in [facility] hospital lab), Hosp Order     Status: None   Collection Time: 03/18/19 12:58 PM  Result Value Ref Range Status   SARS Coronavirus 2 NEGATIVE NEGATIVE Final    Comment: (NOTE) If result is NEGATIVE SARS-CoV-2 target nucleic acids are NOT DETECTED. The SARS-CoV-2 RNA is generally detectable in upper and lower  respiratory specimens during the acute phase of infection. The  lowest  concentration of SARS-CoV-2 viral copies this assay can detect is 250  copies / mL. A negative result does not preclude SARS-CoV-2 infection  and should not be used as the sole basis for treatment or other  patient management decisions.  A negative result may occur with  improper specimen collection / handling, submission of specimen other  than nasopharyngeal swab, presence of viral mutation(s) within the  areas targeted by this assay, and inadequate number of viral copies  (<250 copies / mL). A negative result must be combined with clinical  observations, patient history, and epidemiological information. If result is POSITIVE SARS-CoV-2 target nucleic acids are DETECTED. The SARS-CoV-2 RNA is generally detectable in upper and lower  respiratory specimens dur ing the acute phase of infection.  Positive  results are indicative of active infection with SARS-CoV-2.  Clinical  correlation with patient history and other diagnostic information is  necessary to determine patient infection status.  Positive results do  not rule out bacterial infection or co-infection with other viruses. If result is PRESUMPTIVE POSTIVE SARS-CoV-2 nucleic acids MAY BE PRESENT.   A presumptive positive result was obtained on the submitted specimen  and confirmed on repeat testing.  While 2019 novel coronavirus  (SARS-CoV-2) nucleic acids may be present in the submitted sample  additional confirmatory testing may be necessary for epidemiological  and / or clinical management purposes  to differentiate between  SARS-CoV-2 and other Sarbecovirus currently known to infect humans.  If clinically indicated additional testing with an alternate test  methodology [phone]([id number]) is advised. The SARS-CoV-2 RNA is generally  detectable in upper and lower respiratory sp ecimens during the acute  phase of infection. The expected result is Negative. Fact Sheet for Patients:  BoilerBrush.com.cyhttps://www.fda.gov/media/136312/download  Fact Sheet for Healthcare Providers: https://pope.com/https://www.fda.gov/media/136313/download This test is not yet approved or cleared by the [place] FDA and has been authorized for detection and/or diagnosis of SARS-CoV-2 by FDA under an Emergency Use Authorization (EUA).  This EUA will remain in effect (meaning this test can be used) for the duration of the COVID-19 declaration under Section 564(b)(1) of the Act, 21 U.S.C. section 360bbb-3(b)(1), unless the authorization is terminated or revoked sooner. Performed at [facility], [address] [place], [place] [id number]   Blood culture (routine x 2)     Status: None (Preliminary result)  Collection Time: 03/18/19  2:40 PM  Result Value Ref Range Status   Specimen Description BLOOD RIGHT HAND  Final   Special Requests   Final    BOTTLES DRAWN AEROBIC AND ANAEROBIC Blood Culture adequate volume   Culture   Final    NO GROWTH 2 DAYS Performed at [facility], [address] [place], [place] [address]    Report Status PENDING  Incomplete  Blood culture (routine x 2)     Status: None (Preliminary result)   Collection Time: 03/18/19  2:48 PM  Result Value Ref Range Status   Specimen Description BLOOD RIGHT ARM  Final   Special Requests   Final    BOTTLES DRAWN AEROBIC AND ANAEROBIC Blood Culture adequate volume   Culture   Final    NO GROWTH 2 DAYS Performed at [facility], [address] [place], [place] [address]    Report Status PENDING  Incomplete  Respiratory Panel by PCR     Status: None   Collection Time: 03/18/19  6:13 PM  Result Value Ref Range Status   Adenovirus NOT DETECTED NOT DETECTED Final   Coronavirus 229E NOT DETECTED NOT DETECTED Final    Comment: (NOTE) The Coronavirus on the Respiratory Panel, DOES NOT test for the novel  Coronavirus (2019 nCoV)    Coronavirus HKU1 NOT DETECTED NOT DETECTED Final   Coronavirus NL63 NOT DETECTED NOT DETECTED Final   Coronavirus OC43 NOT DETECTED NOT  DETECTED Final   Metapneumovirus NOT DETECTED NOT DETECTED Final   Rhinovirus / Enterovirus NOT DETECTED NOT DETECTED Final   Influenza A NOT DETECTED NOT DETECTED Final   Influenza B NOT DETECTED NOT DETECTED Final   Parainfluenza Virus 1 NOT DETECTED NOT DETECTED Final   Parainfluenza Virus 2 NOT DETECTED NOT DETECTED Final   Parainfluenza Virus 3 NOT DETECTED NOT DETECTED Final   Parainfluenza Virus 4 NOT DETECTED NOT DETECTED Final   Respiratory Syncytial Virus NOT DETECTED NOT DETECTED Final   Bordetella pertussis NOT DETECTED NOT DETECTED Final   Chlamydophila pneumoniae NOT DETECTED NOT DETECTED Final   Mycoplasma pneumoniae NOT DETECTED NOT DETECTED Final    Comment: Performed at [facility], [address], [place], [place] [address]     Labs: Basic Metabolic Panel: Recent Labs  Lab 03/18/19 1124 03/19/19 0911 03/20/19 1322  NA 139 140 142  K 3.3* 3.1* 3.3*  CL 101 97* 99  CO2 GLUCOSE 71 167* 196*  BUN CREATININE 1.46* 1.43* 1.26*  CALCIUM 8.8* 8.9 8.8*  MG  --   --  1.2*  PHOS  --   --  2.4*   Liver Function Tests: Recent Labs  Lab 03/18/19 1124  AST 18  ALT 13  ALKPHOS 86  BILITOT 1.0  PROT 6.5  ALBUMIN 3.2*   No results for input(s): LIPASE, AMYLASE in the last 168 hours. No results for input(s): AMMONIA in the last 168 hours. CBC: Recent Labs  Lab 03/18/19 1124  WBC 7.8  NEUTROABS 6.3  HGB 13.3  HCT 44.0  MCV 84.5  PLT 233   Cardiac Enzymes: Recent Labs  Lab 03/18/19 1124 03/18/19 2109 03/19/19 0327 03/19/19 0911  TROPONINI 0.03* 0.04* 0.04* 0.03*   BNP: BNP (last 3 results) Recent Labs    10/10/18 1211 11/19/18 1134 03/18/19 1124  BNP 282.9* 242* 476.9*    ProBNP (last 3 results) No results for input(s): PROBNP in the last 8760  hours.  CBG: Recent Labs  Lab 03/19/19 1114 03/19/19 1611 03/19/19 2107 03/20/19 0624 03/20/19 1121  GLUCAP 127* 182* 159* 79 143*       Signed:  Laverna Peace, MD [facility] 03/20/2019, 4:04 PM

## 2019-03-20 NOTE — Progress Notes (Signed)
  Echocardiogram 2D Echocardiogram has been performed.  Jerry Montgomery 03/20/2019, 10:32 AM

## 2019-03-20 NOTE — Progress Notes (Signed)
Palliative Medicine RN Note:  Consult order noted, as well as plan for hopeful d/c today. If patient is d/c before our team can see him, we recommend RNCM order to set up outpatient palliative care through either [facility] ([facility] or [facility].  Margret Chance Jessaca Philippi, RN, BSN, [facility] Palliative Medicine Team 03/20/2019 9:01 AM Office [phone]

## 2019-03-23 LAB — CULTURE, BLOOD (ROUTINE X 2)
Culture: NO GROWTH
Culture: NO GROWTH
Special Requests: ADEQUATE
Special Requests: ADEQUATE

## 2019-03-27 ENCOUNTER — Encounter (HOSPITAL_COMMUNITY): Payer: PPO

## 2019-03-27 ENCOUNTER — Ambulatory Visit (HOSPITAL_COMMUNITY)
Admission: RE | Admit: 2019-03-27 | Discharge: 2019-03-27 | Disposition: A | Discharge status: Home / Self Care | Payer: PPO | Source: Ambulatory Visit | Attending: Internal Medicine | Admitting: Internal Medicine

## 2019-03-27 ENCOUNTER — Other Ambulatory Visit: Payer: Self-pay

## 2019-03-27 ENCOUNTER — Encounter: Payer: PPO | Admitting: *Deleted

## 2019-03-27 ENCOUNTER — Telehealth: Payer: Self-pay | Admitting: Internal Medicine

## 2019-03-27 DIAGNOSIS — J84112 Idiopathic pulmonary fibrosis: Secondary | ICD-10-CM

## 2019-03-27 DIAGNOSIS — Z006 Encounter for examination for normal comparison and control in clinical research program: Secondary | ICD-10-CM

## 2019-03-27 MED ORDER — STUDY - FIBROGEN - PAMREVLUMAB OR PLACEBO 10 MG/ML IV INFUSION (PI-RAMASWAMY)
30.0000 mg/kg | Freq: Once | INTRAVENOUS | Status: AC
  Administered 2019-03-27: 3140 mg via INTRAVENOUS
  Filled 2019-03-27: qty 314

## 2019-03-27 NOTE — Research (Signed)
Title: [id number] (FibroGen Study) is a Phase 3, randomized, double-blind, placebo-controlled multicenter international study to evaluate evaluate the efficacy and safety of 30 mg/kg IV infusions of pamrevlumab administered every 3 weeks for 52 weeks as compared to placebo in subjects with Idiopathic Pulmonary Fibrosis. Primary end point is: change in FVC from baseline at week [name] note : This visit for [name] with DOB: December 26, 1955 on 03/27/2019 for the above protocol is Visit/Encounter # week 31  and is for purpose of research . Subjectexpressed continued interest and consent in continuing as a study subject. Subject confirmed that there was no change in contact information (e.g. address, telephone, email). Subject thanked for participation in research and contribution to science.   Dr. Chase Caller met with the subject via video at approx 09:30 to review subjects status. Approval to proceed with infusion.   AE's reviewed: Subject still experiencing fatigue, hypomagnesemia, weight loss, dysgeusia, and anosmia.  Possible New AE: Subject was started on testosterone on 05Mar2020 due to low testosterone level. This is not in subjects past medical history. Will discuss with Dr. Chase Caller and have him determine if this is a new adverse event. Please refer to PI attestation for further details.   SAE: Subject was hospitalized on Mar 18, 2019-Mar 20, 2019. This has been documented as an SAE in the subjects paper source binder. This SAE is resolved.   Subject tolerated infusion well and left site after 1 hour waiting period post infusion.    All procedures completed per the above mentioned protocol. Refer to the subjects paper source binder for further details of the visit.    Signed by Jon Billings, CMA, [place] Coordinator  [place], [place] 9:33 AM 03/27/2019

## 2019-03-27 NOTE — Telephone Encounter (Signed)
Dear Dr. Tanya Nones,  Jerry Montgomery is a patient you share with Dr. [name]. During Jerry Montgomery recent research study visit Dr. [name] noticed that while the patient was admitted from Mar 18, 2019 to Mar 20, 2019 his magnesium levels were low. Jerry Montgomery stated he has an appointment with you on April 01, 2019 and Dr. [name] is requesting that   a chemistry panel, magnesium level, and phosphorus level be drawn on Jerry Montgomery at that time.  Please let me know if you have any questions or if I can help in any way.   Thank you,  Orlin Hilding, CMA, CCRC2

## 2019-03-31 ENCOUNTER — Telehealth: Payer: Self-pay | Admitting: *Deleted

## 2019-03-31 NOTE — Telephone Encounter (Signed)
Received call from patient daughter, Crystal. [phone] telephone.   Reports that patient has hospital F/U on 04/01/2019, but patient noted to have increased edema today. States that swelling is noted in BLE, ankles and feet, and in R arm. Reports SOB. States that he continues 4 fluid pills daily, but she is unsure of the name.   Advised to take patient to ER for evaluation. May require IV medications. States that patient refuses to go and will only go if PCP recommends face to face. again advised to have patient evaluated in hospital.  Reports that she will attempt to have patient go to ER. States that if Sx worsen, she will contact EMS to transport to ER.   States that she will keep appointment on 04/01/2019 if patient refuses ER eval.   MD to be made aware.

## 2019-03-31 NOTE — Telephone Encounter (Signed)
I will be glad to.  

## 2019-03-31 NOTE — Telephone Encounter (Signed)
Call placed to patient and patient daughter Jerry Montgomery made aware.  

## 2019-03-31 NOTE — Telephone Encounter (Signed)
Supposed to be on Lasix 40 mg twice a day.  Increase this to 60 mg twice a day.  If he has already taken his Lasix this morning he can take an additional 20 mg now and then 60 mg this afternoon

## 2019-04-01 ENCOUNTER — Encounter: Payer: Self-pay | Admitting: Family Medicine

## 2019-04-01 ENCOUNTER — Other Ambulatory Visit: Payer: Self-pay

## 2019-04-01 ENCOUNTER — Ambulatory Visit (INDEPENDENT_AMBULATORY_CARE_PROVIDER_SITE_OTHER): Payer: PPO | Admitting: Family Medicine

## 2019-04-01 VITALS — BP 100/60 | HR 68 | Temp 98.5°F | Resp 24 | Ht 73.0 in | Wt 213.0 lb

## 2019-04-01 DIAGNOSIS — I2609 Other pulmonary embolism with acute cor pulmonale: Secondary | ICD-10-CM

## 2019-04-01 DIAGNOSIS — E876 Hypokalemia: Secondary | ICD-10-CM | POA: Diagnosis not present

## 2019-04-01 DIAGNOSIS — J8410 Pulmonary fibrosis, unspecified: Secondary | ICD-10-CM

## 2019-04-01 MED ORDER — LORAZEPAM 0.5 MG PO TABS: 0.5000 mg | ORAL_TABLET | Freq: Three times a day (TID) | ORAL | 1 refills | Status: AC | PRN

## 2019-04-01 NOTE — Progress Notes (Signed)
Subjective:    Patient ID: Jerry Montgomery, male    DOB: 1956/10/09, 63 y.o.   MRN: [id number]  HPI  Patient is here today for follow-up from his recent hospitalization for right-sided heart failure and cor pulmonale.  I have copied relevant portions of his discharge summary and included them below for my reference: Admit date: 03/18/2019 Discharge date: 03/20/2019   Time spent: < 25 minutes  Admitted From: Home Disposition: Home  Recommendations for Outpatient Follow-up:  1. Follow up with PCP in 1 week, repeat BMP, mag 2. Medications: Increase Lasix to 40 mg twice daily, take oral magnesium and potassium 3. Outpatient consultation with palliative medicine with [facility] arranged 4.     Discharge Diagnoses:      Active Hospital Problems   Diagnosis Date Noted  . Congestive heart failure (CHF) ([place]) 03/18/2019  . Paroxysmal atrial fibrillation ([place]) 10/28/2018  . Chronic anticoagulation 07/24/2018  . Chronic renal disease, stage III ([place]) 03/09/2015  . Chronic respiratory failure with hypoxia ([place]) 03/09/2015  . Other emphysema ([place]) 03/09/2015  . Acute diastolic heart failure ([place])   . IPF (idiopathic pulmonary fibrosis) ([place])   . Dyspnea 07/08/2013  . ILD (interstitial lung disease) ([place]) 07/08/2013  . Essential hypertension 02/22/2010    Resolved Hospital Problems  No resolved problems to display.    Discharge Condition: Guarded  CODE STATUS: DNR Diet recommendation:        Vitals:   03/20/19 0442 03/20/19 1126  BP: 119/67 129/76  Pulse: 61 61  Resp: 20 19  Temp: 98.1 F (36.7 C) 97.8 F (36.6 C)  SpO2: 95% 93%    History of present illness:  Jerry Montgomery a 63 y.o.year old malewithwith medical history significant for chronic hypoxic respiratory failure secondary to IPF, A. fib/flutter status post pacemaker, CHF diastolic dysfunction, CKD stage III, COPD, hypothyroidismwho presented on 5/26/2020with progressive dyspnea,  lower leg swellingover several daysand was found to haveacute on chronic hypoxic respiratory failure. Hospital course addressed in problem based format below:   Hospital Course:   1. Acute on chronic hypoxic respiratory failure presumed secondary to progression of underlying IPF disease and associated volume overload related to acute right sided heart failure with cor pulmonale(i.e. decompensation related to pulmonary fibrosis) .Doubt infectious process given unremarkable RVP/strep pneumo.  TTE showed preserved EF with signs of right ventricular overload presume related to pulmonary hypertension from pulmonary fibrosis.Patient is back to baseline conversational dyspnea and down 3L after IVLasix therapy, with no peripheral edema.  Pulmonary agrees with continuing oral Lasix, will discharge on increased regimen of 40 mg twice daily (previously on 20 mg daily before admission), in addition to continue magnesium (1.2 on discharge, patient elected to continue oral supplementation instead of IV) and potassium supplementation with close follow-up with PCP within a week.   2. Chronic hypoxic respiratory failure in setting of underlying ILD/pulmonary fibrosis.  Pulmonary does not believe this is consistent with an IPF flare but more in line with the progression of his underlying disease in the setting of decompensation from CHF as mentioned above.  Was able to maintain normal oxygen saturation on ambulatory oxygen test prior to discharge, continue 8 L nasal cannula (baseline O2 requirements).  Given quite elevated O2 requirements and concern for progression of lung disease palliative care to assist with outpatient conversations of goals of care ([name]).  3. COPD, no acute flare continue home inhalers  4. Atrial fibrillation status post pacemaker, continue diltiazem and Pradaxa  5. CKD stage III, stable. Creatinine 1.2 on discharge. Repeat BMP on PCP check recommended while on  increased lasix.    6. Type 2 diabetes, controlled, Lantus 60 units,continue sliding scale insulin, monitor CBGholding home oral hypoglycemics  7. Hypothyroidism, stable continue Synthroid.  8. Hyperlipidemia, stable continue home Lipitor   Consultations:  Pulmonology  Procedures/Studies: TTE< 03/20/19 EF 60-65%, right ventricular volume or pressure overload, RVSP 66 mmHg with moderate reduced systolic function right ventricle indeterminate left ventricular diastolic parameters  April 01, 2019  Patient is here today for follow-up.  He is with his daughter.  Patient's health has been in steady decline over the last 6 to 12 months.  I am concerned that his overall life expectancy is less than 6 months.  I had this discussion today privately with his daughter.  Patient states that he feels extremely tired all the time.  He is very weak.  They called me yesterday due to increased swelling in his arms and legs and increasing shortness of breath.  We increased his Lasix to 60 mg twice daily and he has done that now for 24 hours.  The swelling has improved and his breathing is back to his baseline which is still poor.  Patient's weight today is 213 pounds.  There is no pitting edema in his legs.  There is no pitting edema in his arms.  He does have bibasilar crackles which is a chronic finding due to his pulmonary fibrosis but the rails he previously had prior to his hospitalization have resolved.  I believe today is his accurate dry weight.  I have recommended that they weigh him at home on his scales in place this is a goal.  I have recommended that they weigh him every day so that we can track if he is developing pulmonary edema or general anasarca.  He is currently reduced his Lantus to 40 units once daily and is still experiencing hypoglycemic episodes in the 50s and 80s in the morning.  The had to call EMS last week due to hypoglycemic episode in the 50s.  He is stopped his NovoLog altogether.   He also reports panic attacks and worsening anxiety.  The patient realizes he is in the terminal phases of his illness.  This has been feeling anxious.  He is also having increasing trouble sleeping.  The Ambien helps him fall asleep from 10 to 2 AM however he frequently needs something to help fall back asleep.  Past Medical History:  Diagnosis Date  . Acute diastolic heart failure (HCC)   . Acute diastolic heart failure (HCC) 03/19/2019  . Atrial fibrillation (HCC)    vs flutter  . Atrial flutter (HCC)   . Atrial tachycardia (HCC)   . CAD (coronary artery disease)    a. PCI of prox LAD and Cx in 2004. b. PCI to Cx 2008. c. PCI to ISR of Cx in 2011.  . Cardiomyopathy (HCC)    a. EF 45-50% in 06/2018.  Marland Kitchen. Chronic respiratory failure with hypoxia, on home O2 therapy (HCC)   . CKD (chronic kidney disease), stage III (HCC)   . COPD (chronic obstructive pulmonary disease) (HCC)   . Emphysematous bleb (HCC)    a.  s/p pleurectomy/pleurocentesis 2011  . Hyperlipidemia   . Hypertension   . Hypothyroidism   . Idiopathic pulmonary fibrosis (HCC)   . On home oxygen therapy    "3L; 24/7" (04/15/2015)  . Pneumothorax on right 4/11  . Postinflammatory pulmonary fibrosis (HCC)   .  Sleep apnea    "won't use CPAP" (04/14/2105)  . SVT (supraventricular tachycardia) (HCC)    Per Dr. [name]'s note  . Type II diabetes mellitus (HCC)   . WPW (Wolff-Parkinson-White syndrome)    Per Dr. [name]'s Note   Past Surgical History:  Procedure Laterality Date  . AV NODE ABLATION N/A 10/28/2018   Procedure: AV NODE ABLATION;  Surgeon: Marinus Mawaylor, Gregg W, MD;  [facility];  Service: Cardiovascular;  Laterality: N/A;  . BILATERAL VATS ABLATION Right   . CARDIAC CATHETERIZATION  05/03/2007   patent stents  . CARDIAC CATHETERIZATION N/A 05/18/2015   Procedure: Right Heart Cath;  Surgeon: Lyn RecordsHenry W Smith, MD;  [facility];  Service: Cardiovascular;  Laterality: N/A;  . CORONARY  ANGIOPLASTY WITH STENT PLACEMENT  03/05/2003; 2008; 01/13/2010   PCI & Stent  LAD & mid CX; stent to CX  (I've got 4 stents total" (04/15/2015)  . ELECTROPHYSIOLOGIC STUDY N/A 04/14/2015   Procedure: SVT Ablation;  Surgeon: Marinus MawGregg W Taylor, MD;  [facility];  Service: Cardiovascular;  Laterality: N/A;  . KNEE ARTHROSCOPY Right 01/22/2013   Procedure: RIGHT ARTHROSCOPY KNEE WITH DEBRIDEMENT, abrasion chondroplasty of lateral tibial plateau, debridement of partial tear of ACL, menisectomy;  Surgeon: Jacki Conesonald A Gioffre, MD;  Location: [facility] ORS;  Service: Orthopedics;  Laterality: Right;  . KNEE SURGERY  2012   "reattached quad"  . LUNG LOBECTOMY Right   . PACEMAKER IMPLANT N/A 10/28/2018   Procedure: PACEMAKER IMPLANT;  Surgeon: Marinus Mawaylor, Gregg W, MD;  [facility];  Service: Cardiovascular;  Laterality: N/A;  . SHOULDER ARTHROSCOPY W/ ROTATOR CUFF REPAIR Bilateral   . VIDEO ASSISTED THORACOSCOPY Left 07/19/2015   Procedure: LEFT VIDEO ASSISTED THORACOSCOPY WITH LEFT UPPER AND LOWER LUNG BIOPSY;  Surgeon: Loreli SlotSteven C Hendrickson, MD;  Location: [facility] OR;  Service: Thoracic;  Laterality: Left;  Marland Kitchen. VIDEO BRONCHOSCOPY N/A 07/19/2015   Procedure: VIDEO BRONCHOSCOPY WITH FLUORO;  Surgeon: Loreli SlotSteven C Hendrickson, MD;  [facility];  Service: Thoracic;  Laterality: N/A;   Current Outpatient Medications on File Prior to Visit  Medication Sig Dispense Refill  . ALPRAZolam (XANAX) 0.5 MG tablet Take 1 tablet (0.5 mg total) by mouth at bedtime as needed for anxiety. 30 tablet 0  . aspirin 81 MG tablet Take 81 mg by mouth every morning.     Marland Kitchen. atorvastatin (LIPITOR) 40 MG tablet Take 1 tablet by mouth every night at bedtime 90 tablet 3  . Cholecalciferol (VITAMIN D-3 PO) Take 1 capsule by mouth daily.    . Cyanocobalamin (VITAMIN B-12 PO) Take 1 tablet by mouth daily.    . dabigatran (PRADAXA) 150 MG CAPS capsule Take 150 mg by mouth 2 (two) times daily.    . dapagliflozin propanediol (FARXIGA) 10  MG TABS tablet Take 10 mg by mouth daily. 30 tablet 0  . diltiazem (CARDIZEM CD) 300 MG 24 hr capsule TAKE 1 CAPSULE BY MOUTH EVERY DAY (Patient taking differently: Take 300 mg by mouth daily. ) 90 capsule 2  . furosemide (LASIX) 20 MG tablet Take 2 tablets (40 mg total) by mouth 2 (two) times daily. 60 tablet 1  . Investigational - Study Medication "Title: FGCL-3019-091 (FibroGen Study) is a Phase 3, randomized, double-blind, placebo-controlled multicenter international study to evaluate evaluate the efficacy and safety of 30 mg/kg IV infusions of pamrevlumab administered every 3 weeks for 52 weeks as compared to placebo in subjects with Idiopathic Pulmonary Fibrosis. Primary end point  is: change in FVC from baseline at week 52"    . levothyroxine (SYNTHROID, LEVOTHROID) 125 MCG tablet Take 1 tablet (125 mcg total) by mouth daily. 90 tablet 2  . MAGNESIUM PO Take 1 tablet by mouth daily.    . Menthol, Topical Analgesic, (BIOFREEZE EX) Apply 1 application topically as needed (for pain).    . metFORMIN (GLUCOPHAGE) 850 MG tablet Take 1 tablet by mouth twice a day 180 tablet 1  . metoprolol tartrate (LOPRESSOR) 100 MG tablet Take 100 mg by mouth 2 (two) times daily.    . nitroGLYCERIN (NITROSTAT) 0.4 MG SL tablet Place 1 tablet (0.4 mg total) under the tongue every 5 (five) minutes as needed for chest pain. 25 tablet 1  . omega-3 acid ethyl esters (LOVAZA) 1 g capsule Take 2 capsules (2 g total) by mouth 2 (two) times daily. 360 capsule 3  . OXYGEN Inhale 6-8 L into the lungs continuous.    Marland Kitchen. POTASSIUM PO Take 1 tablet by mouth daily.    Marland Kitchen. zolpidem (AMBIEN) 10 MG tablet Take 1 tablet by mouth at bedtime. 90 tablet 1  . insulin aspart (NOVOLOG) 100 UNIT/ML injection 15 UNITS WITH BREAKFAST AND 15 UNITS WITH LUNCH, 20 UNITS WITH SUPPER (Patient not taking: Reported on 04/01/2019) 60 mL 5  . LANTUS 100 UNIT/ML injection INJECT 0.6 MLS (60 UNITS TOTAL) INTO THE SKIN AT BEDTIME. (Patient not taking: No sig  reported) 60 mL 3   No current facility-administered medications on file prior to visit.    Allergies  Allergen Reactions  . Niaspan [Niacin] Itching  . Ofev [Nintedanib] Other (See Comments)    "Lethargic and wiped out"  . Pirfenidone Other (See Comments)    Caused the patient to unexpectedly wet the bed in the middle of the night (was never a problem prior to taking this med)  . Ramipril Cough        Social History   Socioeconomic History  . Marital status: Single    Spouse name: Not on file  . Number of children: 1  . Years of education: Not on file  . Highest education level: Not on file  Occupational History    Employer: [employer]  Social Needs  . Financial resource strain: Not hard at all  . Food insecurity:    Worry: Never true    Inability: Never true  . Transportation needs:    Medical: No    Non-medical: No  Tobacco Use  . Smoking status: Former Smoker    Packs/day: 1.50    Years: 35.00    Pack years: 52.50    Types: Cigarettes    Last attempt to quit: 10/24/2007    Years since quitting: 11.4  . Smokeless tobacco: Never Used  Substance and Sexual Activity  . Alcohol use: Not Currently    Alcohol/week: 0.0 standard drinks  . Drug use: No  . Sexual activity: Not Currently  Lifestyle  . Physical activity:    Days per week: 0 days    Minutes per session: 0 min  . Stress: Not at all  Relationships  . Social connections:    Talks on phone: Once a week    Gets together: More than three times a week    Attends religious service: Never    Active member of club or organization: No    Attends meetings of clubs or organizations: Never    Relationship status: Not on file  . Intimate partner violence:    Fear of  current or ex partner: Not on file    Emotionally abused: Not on file    Physically abused: Not on file    Forced sexual activity: Not on file  Other Topics Concern  . Not on file  Social History Narrative  . Not on file      Review of  Systems  All other systems reviewed and are negative.      Objective:   Physical Exam Constitutional:      General: He is not in acute distress.    Appearance: Normal appearance. He is not ill-appearing or toxic-appearing.  Cardiovascular:     Rate and Rhythm: Normal rate and regular rhythm.     Pulses: Normal pulses.     Heart sounds: Normal heart sounds.  Pulmonary:     Effort: No respiratory distress.     Breath sounds: Rales present.  Abdominal:     General: Abdomen is flat. Bowel sounds are normal.     Palpations: Abdomen is soft.  Musculoskeletal:     Right lower leg: No edema.     Left lower leg: No edema.  Neurological:     Mental Status: He is alert.           Assessment & Plan:  Pulmonary fibrosis (HCC)  Acute cor pulmonale (HCC)  Hypomagnesemia - Plan: COMPLETE METABOLIC PANEL WITH GFR, Magnesium  Hypokalemia - Plan: COMPLETE METABOLIC PANEL WITH GFR, Magnesium  Patient's pulmonary fibrosis appears to be stable.  He appears to euvolemic today.  I recommended reducing his Lasix dose back to 40 mg twice daily.  I recommended that they weigh him every day and monitor his weight.  Should he gain more than 2 pounds in 24 hours or if his family sees a steady increase over several days later to notify me immediately so that we could temporarily increase his Lasix to manage the pulmonary edema that is likely the result of cor pulmonale due to his underlying pulmonary fibrosis.  Given his recent hypomagnesemia and hypokalemia I will recheck a CMP and a magnesium level and replete electrolytes as needed.  I spent today more than 25 minutes with the patient and his family discussing his condition.  His overall prognosis appears poor.  I suspect that he has less than 6 months to live.  Palliative care has been consulted and is scheduling an outpatient follow-up with the patient.  I have recommended reducing his Lantus to 30 units a day in an effort to try to keep his fasting  blood sugars in the morning between 100-200.  He can use sliding scale NovoLog to correct hyperglycemia greater than 250.

## 2019-04-03 ENCOUNTER — Telehealth: Payer: Self-pay | Admitting: Family Medicine

## 2019-04-03 ENCOUNTER — Other Ambulatory Visit: Payer: Self-pay | Admitting: Family Medicine

## 2019-04-03 ENCOUNTER — Other Ambulatory Visit: Payer: Self-pay

## 2019-04-03 DIAGNOSIS — E038 Other specified hypothyroidism: Secondary | ICD-10-CM

## 2019-04-03 NOTE — Telephone Encounter (Signed)
Pt assistance insulin came in, pt has been notified of it being in office for pickup

## 2019-04-04 LAB — COMPLETE METABOLIC PANEL WITH GFR
AG Ratio: 1.4 (calc) (ref 1.0–2.5)
ALT: 17 U/L (ref 9–46)
AST: 23 U/L (ref 10–35)
Albumin: 3.8 g/dL (ref 3.6–5.1)
Alkaline phosphatase (APISO): 94 U/L (ref 35–144)
BUN: 13 mg/dL (ref 7–25)
CO2: 29 mmol/L (ref 20–32)
Calcium: 9.1 mg/dL (ref 8.6–10.3)
Chloride: 96 mmol/L — ABNORMAL LOW (ref 98–110)
Creat: 1.19 mg/dL (ref 0.70–1.25)
GFR, Est African American: 74 mL/min/{1.73_m2} (ref >=60)
GFR, Est Non African American: 64 mL/min/{1.73_m2} (ref >=60)
Globulin: 2.8 g/dL (calc) (ref 1.9–3.7)
Glucose, Bld: 208 mg/dL — ABNORMAL HIGH (ref 65–99)
Potassium: 4.2 mmol/L (ref 3.5–5.3)
Sodium: 141 mmol/L (ref 135–146)
Total Bilirubin: 0.9 mg/dL (ref 0.2–1.2)
Total Protein: 6.6 g/dL (ref 6.1–8.1)

## 2019-04-04 LAB — PHOSPHORUS: Phosphorus: 4.4 mg/dL — ABNORMAL HIGH (ref 2.1–4.3)

## 2019-04-04 LAB — TEST AUTHORIZATION

## 2019-04-04 LAB — EXTRA LAV TOP TUBE

## 2019-04-04 LAB — MAGNESIUM: Magnesium: 1.6 mg/dL (ref 1.5–2.5)

## 2019-04-16 ENCOUNTER — Other Ambulatory Visit: Payer: Self-pay

## 2019-04-16 ENCOUNTER — Encounter: Payer: PPO | Admitting: *Deleted

## 2019-04-16 ENCOUNTER — Ambulatory Visit (HOSPITAL_COMMUNITY)
Admission: RE | Admit: 2019-04-16 | Discharge: 2019-04-16 | Disposition: A | Discharge status: Home / Self Care | Payer: PPO | Source: Ambulatory Visit | Attending: Internal Medicine | Admitting: Internal Medicine

## 2019-04-16 DIAGNOSIS — J84112 Idiopathic pulmonary fibrosis: Secondary | ICD-10-CM

## 2019-04-16 DIAGNOSIS — Z006 Encounter for examination for normal comparison and control in clinical research program: Secondary | ICD-10-CM

## 2019-04-16 MED ORDER — STUDY - FIBROGEN - PAMREVLUMAB OR PLACEBO 10 MG/ML IV INFUSION (PI-RAMASWAMY)
30.0000 mg/kg | Freq: Once | INTRAVENOUS | Status: AC
  Administered 2019-04-16: 3140 mg via INTRAVENOUS
  Filled 2019-04-16: qty 314

## 2019-04-16 NOTE — Research (Signed)
Title: FGCL-3019-091 (FibroGen Study) is a Phase 3, randomized, double-blind, placebo-controlled multicenter international study to evaluate evaluate the efficacy and safety of 30 mg/kg IV infusions of pamrevlumab administered every 3 weeks for 52 weeks as compared to placebo in subjects with Idiopathic Pulmonary Fibrosis. Primary end point is: change in FVC from baseline at week [place] / Research RN note : This visit for [name] with DOB: 1956/04/11 on 04/16/2019 for the above protocol is Visit/Encounter # Week 73  and is for purpose of research .  Subject expressed continued interest and consent in continuing as a study subject. Subject confirmed that there was no change in contact information (e.g. address, telephone, email). Subject thanked for participation in research and contribution to science.   In this visit 04/16/2019 the subject will be evaluated by investigator named Dr. Chase Caller vis video  . This research coordinator has verified that the investigator is  uptodate with his/her training logs.  All procedures completed per the above mentioned protocol.  Adverse Events: Fatigue -ongoing Hypomagnesimia-will review with PI if ongoing? Weight-Loss-ongoing Dysgeusia-ongoing Anosmia-ongoing Low Testosterone-ongoing  PCP note from 04/01/2019 states that on March 31, 2019 subjects daughter called stating subject had increased edema in arm and both legs and increased SOB. PCP recommended increasing lasix to 60mg  twice daily. Subject took this dose on March 31, 2019.  On June 9 subject reported for visit and edema was resolved and increased SOB had improved back to baseline. Palliative Care has been consulted and is visiting subject at home on April 17, 2019 for initial consult.   Possible New AE's: New AE: Edema, bilateral arms and legs, resolved per PCP on April 01, 2019, PI to assess.  New AE: Worsening Anxiety and panic attacks, ongoing,  PI to assess New  AE: Worsening Insomnia, ongoing, PI to assess New AE: Hypokalemia, PI to assess  Subject had no complaints following infusion. Week 48 visit scheduled for May 06, 2019. Subject is aware.     Signed by Jon Billings, CMA, [place] Coordinator  [place], [place] 10:08 AM 04/16/2019

## 2019-04-17 ENCOUNTER — Other Ambulatory Visit: Payer: Self-pay | Admitting: Physician Assistant

## 2019-04-18 ENCOUNTER — Telehealth: Payer: Self-pay | Admitting: Family Medicine

## 2019-04-18 NOTE — Telephone Encounter (Signed)
As long as his weight is stable, keep lasix 40 bid and change this to his permanent med dose. I would check bmp to monitor Cr, and K

## 2019-04-18 NOTE — Telephone Encounter (Addendum)
[place] nurse called want to f/u on pt's dose of furosemide and how he was to take on a daily basis? (lov states 40mg  bid - do you want him to continue this his weight has been stable running around 209 lbs)  Also pt has no taste of food and she was wondering if it would be ok for him to take Zinc?

## 2019-04-22 NOTE — Telephone Encounter (Signed)
Pt aware of recommendations

## 2019-04-30 ENCOUNTER — Ambulatory Visit (INDEPENDENT_AMBULATORY_CARE_PROVIDER_SITE_OTHER): Payer: PPO | Admitting: *Deleted

## 2019-04-30 DIAGNOSIS — I480 Paroxysmal atrial fibrillation: Secondary | ICD-10-CM | POA: Diagnosis not present

## 2019-05-01 ENCOUNTER — Telehealth: Payer: Self-pay

## 2019-05-01 NOTE — Telephone Encounter (Signed)
Spoke with patient to remind of missed remote transmission 

## 2019-05-02 LAB — CUP PACEART REMOTE DEVICE CHECK
Battery Remaining Longevity: 133 mo
Battery Voltage: 3.03 V
Brady Statistic AP VP Percent: 50.31 %
Brady Statistic AP VS Percent: 0.26 %
Brady Statistic AS VP Percent: 45.67 %
Brady Statistic AS VS Percent: 3.58 %
Brady Statistic RA Percent Paced: 14.5 %
Brady Statistic RV Percent Paced: 96.07 %
Date Time Interrogation Session: 20200708211242
Implantable Lead Implant Date: 20200106
Implantable Lead Implant Date: 20200106
Implantable Lead Location: 753859
Implantable Lead Location: 753860
Implantable Lead Model: 3830
Implantable Lead Model: 5076
Implantable Pulse Generator Implant Date: 20200106
Lead Channel Impedance Value: 323 Ohm
Lead Channel Impedance Value: 361 Ohm
Lead Channel Impedance Value: 456 Ohm
Lead Channel Impedance Value: 551 Ohm
Lead Channel Pacing Threshold Amplitude: 0.625 V
Lead Channel Pacing Threshold Amplitude: 1 V
Lead Channel Pacing Threshold Pulse Width: 0.4 ms
Lead Channel Pacing Threshold Pulse Width: 0.4 ms
Lead Channel Sensing Intrinsic Amplitude: 10.125 mV
Lead Channel Sensing Intrinsic Amplitude: 10.125 mV
Lead Channel Sensing Intrinsic Amplitude: 4.375 mV
Lead Channel Sensing Intrinsic Amplitude: 4.375 mV
Lead Channel Setting Pacing Amplitude: 2 V
Lead Channel Setting Pacing Amplitude: 2.5 V
Lead Channel Setting Pacing Pulse Width: 0.4 ms
Lead Channel Setting Sensing Sensitivity: 2 mV

## 2019-05-06 ENCOUNTER — Other Ambulatory Visit: Payer: Self-pay

## 2019-05-06 ENCOUNTER — Ambulatory Visit (HOSPITAL_COMMUNITY)
Admission: RE | Admit: 2019-05-06 | Discharge: 2019-05-06 | Disposition: A | Discharge status: Home / Self Care | Payer: PPO | Source: Ambulatory Visit | Attending: Internal Medicine | Admitting: Internal Medicine

## 2019-05-06 DIAGNOSIS — Z006 Encounter for examination for normal comparison and control in clinical research program: Secondary | ICD-10-CM | POA: Insufficient documentation

## 2019-05-06 MED ORDER — STUDY - FIBROGEN - PAMREVLUMAB OR PLACEBO 10 MG/ML IV INFUSION (PI-RAMASWAMY)
30.0000 mg/kg | Freq: Once | INTRAVENOUS | Status: AC
  Administered 2019-05-06: 2870 mg via INTRAVENOUS
  Filled 2019-05-06: qty 287

## 2019-05-06 NOTE — Research (Signed)
Title: FGCL-3019-091 (FibroGen Study) is a Phase 3, randomized, double-blind, placebo-controlled multicenter international study to evaluate evaluate the efficacy and safety of 30 mg/kg IV infusions of pamrevlumab administered every 3 weeks for 52 weeks as compared to placebo in subjects with Idiopathic Pulmonary Fibrosis. Primary end point is: change in FVC from baseline at week 52  Protocol #: FGCL-3019-091, Clinical Trials #: [id number] Sponsor: www.[email or web address]  ([place], [place], [place])  Scientist, physiological / Electrical engineer note : This visit for [name] with DOB: [birth year] on 05/06/2019 for the above protocol is Visit/Encounter # Week 1  and is for purpose of research . Subject expressed continued interest and consent in continuing as a study subject. Subject confirmed that there was no change in contact information (e.g. address, telephone, email). Subject thanked for participation in research and contribution to science.   In this visit 05/06/2019 the subject will be evaluated by investigator named Dr. Chase Caller . This research coordinator has verified that the investigator is  uptodate with his/her training logs  All procedures completed per the above stated protocol. Potential for OLE discussed with subject and advised paper work is not finalized at this time. Subject is interested in participating, advised we would be in contact with him once paperwork is completed.   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 PI to assess.     Signed by Evanston Bing, BS, CMA, [facility]  [place], [place] 11:38 AM 05/06/2019

## 2019-05-07 NOTE — Addendum Note (Signed)
Addended by: Benson Setting L on: 05/07/2019 09:25 AM   Modules accepted: Orders

## 2019-05-10 DIAGNOSIS — R269 Unspecified abnormalities of gait and mobility: Secondary | ICD-10-CM | POA: Diagnosis not present

## 2019-05-10 DIAGNOSIS — J8410 Pulmonary fibrosis, unspecified: Secondary | ICD-10-CM | POA: Diagnosis not present

## 2019-05-10 DIAGNOSIS — J438 Other emphysema: Secondary | ICD-10-CM | POA: Diagnosis not present

## 2019-05-10 DIAGNOSIS — J449 Chronic obstructive pulmonary disease, unspecified: Secondary | ICD-10-CM | POA: Diagnosis not present

## 2019-05-12 ENCOUNTER — Encounter: Payer: Self-pay | Admitting: Cardiology

## 2019-05-12 NOTE — Progress Notes (Signed)
Remote pacemaker transmission.   

## 2019-05-27 ENCOUNTER — Encounter (HOSPITAL_COMMUNITY): Payer: PPO

## 2019-06-02 ENCOUNTER — Ambulatory Visit (HOSPITAL_COMMUNITY)
Admission: RE | Admit: 2019-06-02 | Discharge: 2019-06-02 | Disposition: A | Discharge status: Home / Self Care | Payer: PPO | Source: Ambulatory Visit | Attending: Internal Medicine | Admitting: Internal Medicine

## 2019-06-02 ENCOUNTER — Other Ambulatory Visit: Payer: Self-pay

## 2019-06-02 DIAGNOSIS — Z006 Encounter for examination for normal comparison and control in clinical research program: Secondary | ICD-10-CM | POA: Insufficient documentation

## 2019-06-02 DIAGNOSIS — J84112 Idiopathic pulmonary fibrosis: Secondary | ICD-10-CM | POA: Insufficient documentation

## 2019-06-02 MED ORDER — STUDY - FIBROGEN - PAMREVLUMAB 10 MG/ML IV INFUSION (OPEN LABEL) (PI-RAMASWAMY)
30.0000 mg/kg | Freq: Once | INTRAVENOUS | Status: AC
  Administered 2019-06-02: 2890 mg via INTRAVENOUS
  Filled 2019-06-02: qty 289

## 2019-06-02 NOTE — Research (Signed)
OPEN LABEL EXTENSION Title: FGCL-3019-091 (FibroGen Study) is a Phase 3, randomized, double-blind, placebo-controlled multicenter international study to evaluate evaluate the efficacy and safety of 30 mg/kg IV infusions of pamrevlumab administered every 3 weeks for 52 weeks as compared to placebo in subjects with Idiopathic Pulmonary Fibrosis. Primary end point is: change in FVC from baseline at week 52  Protocol #: FGCL-3019-091, Clinical Trials #: [id number] Sponsor: www.[email or web address]  ([place], [place], [place])   Scientist, physiological / Electrical engineer note : This visit for [name] with DOB: [birth year] on 06/02/2019 for the above protocol is Visit/Encounter # EX Day 1  and is for purpose of research . The consent for this encounter is under Protocol Version Amendment 4 and is currently IRB approved.Subject expressed continued interest and consent in continuing as a study subject. Subject confirmed that there was no change in contact information (e.g. address, telephone, email). Subject thanked for participation in research and contribution to science.   Subject consented to the ICF for subject participation in the open label extension phase at this visit on 10Aug2020 at 0916. Dr. Chase Caller, PI was present for the consenting process via video.  Following consent, all procedures were completed according to the amended protocol. Subject was infused over 2 hours with a 1 hour observation period following the infusion. Subject denied any complaints during the infusion or observation period. Refer to the subjects source binder for further details of the visit.     Signed by, Jon Billings, CMA, [facility]  [place], [place] 3:44 PM 06/02/2019

## 2019-06-06 ENCOUNTER — Encounter: Payer: Self-pay | Admitting: Family Medicine

## 2019-06-06 ENCOUNTER — Telehealth: Payer: Self-pay

## 2019-06-06 DIAGNOSIS — I130 Hypertensive heart and chronic kidney disease with heart failure and stage 1 through stage 4 chronic kidney disease, or unspecified chronic kidney disease: Secondary | ICD-10-CM | POA: Diagnosis not present

## 2019-06-06 DIAGNOSIS — N183 Chronic kidney disease, stage 3 (moderate): Secondary | ICD-10-CM | POA: Diagnosis not present

## 2019-06-06 DIAGNOSIS — R7989 Other specified abnormal findings of blood chemistry: Secondary | ICD-10-CM | POA: Diagnosis not present

## 2019-06-06 MED ORDER — FUROSEMIDE 20 MG PO TABS: 40.0000 mg | ORAL_TABLET | Freq: Two times a day (BID) | ORAL | 1 refills | Status: AC

## 2019-06-06 NOTE — Telephone Encounter (Signed)
Tammy from [facility] called to report that pt has edema in both feet and ankles (2+) and rash on R foot. Tammy states his weight on 08/10 was 204 and his weight on 08/14 is 211. She will drawn a stat bmp to check his K and Cr. Pt is taking furosemide 40 mg bid. Please advise.

## 2019-06-06 NOTE — Telephone Encounter (Signed)
LVM for Jerry Montgomery

## 2019-06-06 NOTE — Telephone Encounter (Signed)
They need to call his cardiologist for medication changes

## 2019-06-10 DIAGNOSIS — J449 Chronic obstructive pulmonary disease, unspecified: Secondary | ICD-10-CM | POA: Diagnosis not present

## 2019-06-10 DIAGNOSIS — J8410 Pulmonary fibrosis, unspecified: Secondary | ICD-10-CM | POA: Diagnosis not present

## 2019-06-10 DIAGNOSIS — J438 Other emphysema: Secondary | ICD-10-CM | POA: Diagnosis not present

## 2019-06-10 DIAGNOSIS — R269 Unspecified abnormalities of gait and mobility: Secondary | ICD-10-CM | POA: Diagnosis not present

## 2019-06-12 ENCOUNTER — Telehealth: Payer: Self-pay | Admitting: *Deleted

## 2019-06-12 NOTE — Telephone Encounter (Signed)
Received fax from [facility] with patient lab results.  PCP made aware and recommendations are as follows: BMP WNL No new orders  Call placed to patient and patient made aware.   Call placed to Jeanne Ivan, RN with [facility] [phone] telephone. Made aware of MD recommendations.

## 2019-06-17 ENCOUNTER — Other Ambulatory Visit: Payer: Self-pay | Admitting: Family Medicine

## 2019-06-18 ENCOUNTER — Other Ambulatory Visit: Payer: Self-pay

## 2019-06-19 ENCOUNTER — Encounter: Payer: Self-pay | Admitting: Family Medicine

## 2019-06-19 ENCOUNTER — Ambulatory Visit (INDEPENDENT_AMBULATORY_CARE_PROVIDER_SITE_OTHER): Payer: PPO | Admitting: Family Medicine

## 2019-06-19 VITALS — BP 100/60 | HR 72 | Temp 98.2°F | Resp 24 | Ht 73.0 in | Wt 202.0 lb

## 2019-06-19 DIAGNOSIS — E118 Type 2 diabetes mellitus with unspecified complications: Secondary | ICD-10-CM | POA: Diagnosis not present

## 2019-06-19 DIAGNOSIS — Z23 Encounter for immunization: Secondary | ICD-10-CM | POA: Diagnosis not present

## 2019-06-19 DIAGNOSIS — F5101 Primary insomnia: Secondary | ICD-10-CM | POA: Diagnosis not present

## 2019-06-19 DIAGNOSIS — J8410 Pulmonary fibrosis, unspecified: Secondary | ICD-10-CM

## 2019-06-19 DIAGNOSIS — I2781 Cor pulmonale (chronic): Secondary | ICD-10-CM | POA: Diagnosis not present

## 2019-06-19 DIAGNOSIS — R0609 Other forms of dyspnea: Secondary | ICD-10-CM | POA: Diagnosis not present

## 2019-06-19 MED ORDER — DIAZEPAM 5 MG PO TABS: 5.0000 mg | ORAL_TABLET | Freq: Two times a day (BID) | ORAL | 1 refills | Status: AC | PRN

## 2019-06-19 NOTE — Progress Notes (Signed)
Subjective:    Patient ID: Jerry Montgomery, male    DOB: 06-29-1956, 63 y.o.   MRN: [id number]  HPI  Patient is here today for follow-up from his recent hospitalization for right-sided heart failure and cor pulmonale.  I have copied relevant portions of his discharge summary and included them below for my reference: Admit date: 03/18/2019 Discharge date: 03/20/2019   Time spent: < 25 minutes  Admitted From: Home Disposition: Home  Recommendations for Outpatient Follow-up:  1. Follow up with PCP in 1 week, repeat BMP, mag 2. Medications: Increase Lasix to 40 mg twice daily, take oral magnesium and potassium 3. Outpatient consultation with palliative medicine with [facility] arranged 4.     Discharge Diagnoses:      Active Hospital Problems   Diagnosis Date Noted   Congestive heart failure (CHF) (HCC) 03/18/2019   Paroxysmal atrial fibrillation (HCC) 10/28/2018   Chronic anticoagulation 07/24/2018   Chronic renal disease, stage III (HCC) 03/09/2015   Chronic respiratory failure with hypoxia (HCC) 03/09/2015   Other emphysema (HCC) 03/09/2015   Acute diastolic heart failure (HCC)    IPF (idiopathic pulmonary fibrosis) (HCC)    Dyspnea 07/08/2013   ILD (interstitial lung disease) (HCC) 07/08/2013   Essential hypertension 02/22/2010    Resolved Hospital Problems  No resolved problems to display.    Discharge Condition: Guarded  CODE STATUS: DNR Diet recommendation:        Vitals:   03/20/19 0442 03/20/19 1126  BP: 119/67 129/76  Pulse: 61 61  Resp: 20 19  Temp: 98.1 F (36.7 C) 97.8 F (36.6 C)  SpO2: 95% 93%    History of present illness:  Jerry Montgomery a 63 y.o.year old malewwith medical history significant for chronic hypoxic respiratory failure secondary to IPF, A. fib/flutter status post pacemaker, CHF diastolic dysfunction, CKD stage III, COPD, hypothyroidismwho presented on 5/26/2020with progressive dyspnea,  lower leg swellingover several daysand was found to haveacute on chronic hypoxic respiratory failure. Hospital course addressed in problem based format below:   Hospital Course:   1. Acute on chronic hypoxic respiratory failure presumed secondary to progression of underlying IPF disease and associated volume overload related to acute right sided heart failure with cor pulmonale(i.e. decompensation related to pulmonary fibrosis) .Doubt infectious process given unremarkable RVP/strep pneumo.  TTE showed preserved EF with signs of right ventricular overload presume related to pulmonary hypertension from pulmonary fibrosis.Patient is back to baseline conversational dyspnea and down 3L after IVLasix therapy, with no peripheral edema.  Pulmonary agrees with continuing oral Lasix, will discharge on increased regimen of 40 mg twice daily (previously on 20 mg daily before admission), in addition to continue magnesium (1.2 on discharge, patient elected to continue oral supplementation instead of IV) and potassium supplementation with close follow-up with PCP within a week.   2. Chronic hypoxic respiratory failure in setting of underlying ILD/pulmonary fibrosis.  Pulmonary does not believe this is consistent with an IPF flare but more in line with the progression of his underlying disease in the setting of decompensation from CHF as mentioned above.  Was able to maintain normal oxygen saturation on ambulatory oxygen test prior to discharge, continue 8 L nasal cannula (baseline O2 requirements).  Given quite elevated O2 requirements and concern for progression of lung disease palliative care to assist with outpatient conversations of goals of care ([facility] [place]).  Consultations:  Pulmonology  Procedures/Studies: TTE< 03/20/19 EF 60-65%, right ventricular volume or pressure overload, RVSP 66 mmHg with  moderate reduced systolic function right ventricle indeterminate left ventricular diastolic  parameters  April 01, 2019  Patient is here today for follow-up.  He is with his daughter.  Patient's health has been in steady decline over the last 6 to 12 months.  I am concerned that his overall life expectancy is less than 6 months.  I had this discussion today privately with his daughter.  Patient states that he feels extremely tired all the time.  He is very weak.  They called me yesterday due to increased swelling in his arms and legs and increasing shortness of breath.  We increased his Lasix to 60 mg twice daily and he has done that now for 24 hours.  The swelling has improved and his breathing is back to his baseline which is still poor.  Patient's weight today is 213 pounds.  There is no pitting edema in his legs.  There is no pitting edema in his arms.  He does have bibasilar crackles which is a chronic finding due to his pulmonary fibrosis but the rails he previously had prior to his hospitalization have resolved.  I believe today is his accurate dry weight.  I have recommended that they weigh him at home on his scales in place this is a goal.  I have recommended that they weigh him every day so that we can track if he is developing pulmonary edema or general anasarca.  He is currently reduced his Lantus to 40 units once daily and is still experiencing hypoglycemic episodes in the 50s and 80s in the morning.  The had to call EMS last week due to hypoglycemic episode in the 50s.  He is stopped his NovoLog altogether.  He also reports panic attacks and worsening anxiety.  The patient realizes he is in the terminal phases of his illness.  This has been feeling anxious.  He is also having increasing trouble sleeping.  The Ambien helps him fall asleep from 10 to 2 AM however he frequently needs something to help fall back asleep.  At that time, my plan was: Patient's pulmonary fibrosis appears to be stable.  He appears to euvolemic today.  I recommended reducing his Lasix dose back to 40 mg twice daily.   I recommended that they weigh him every day and monitor his weight.  Should he gain more than 2 pounds in 24 hours or if his family sees a steady increase over several days later to notify me immediately so that we could temporarily increase his Lasix to manage the pulmonary edema that is likely the result of cor pulmonale due to his underlying pulmonary fibrosis.  Given his recent hypomagnesemia and hypokalemia I will recheck a CMP and a magnesium level and replete electrolytes as needed.  I spent today more than 25 minutes with the patient and his family discussing his condition.  His overall prognosis appears poor.  I suspect that he has less than 6 months to live.  Palliative care has been consulted and is scheduling an outpatient follow-up with the patient.  I have recommended reducing his Lantus to 30 units a day in an effort to try to keep his fasting blood sugars in the morning between 100-200.  He can use sliding scale NovoLog to correct hyperglycemia greater than 250.  06/19/19 Patient recently had a BMP checked by his home health nurse with his renal function and potassium stable.  He is currently taking Lasix 40 mg twice daily.  His insulin use has become sporadic.  The patient states that if his blood sugar is under 150 at night he will not take his Lantus.  Is because he will experience hypoglycemia by the morning time.  Therefore he is not using his Lantus as often.  He is taking 30 units when he takes it.  His blood sugars are typically between 150 and 250.  Patient is requesting a wheelchair.  Patient requires 8 L of oxygen due to pulmonary fibrosis.  He is currently on hospice.  Patient can only walk a short distance before becoming extremely winded and having to stop and sit down.  He also suffers from right-sided congestive heart failure.  Patient definitely requires a wheelchair.  Without a wheelchair he is confined to his home.  He is unable to go anywhere.  He states that he is tired of just  existing in a recliner.  At least with a wheelchair he could go out to dinner or the occasional public location and be pushed around.  He still wants to be active and live as much as possible despite his worsening health.  He does have +1 pitting edema in both feet but this stops at the level of his ankles.  Ironically his lungs sound relatively clear today with no crackles or rails.  He does have diminished breath sounds bilaterally but that is the patient's chronic baseline.  He denies any depression but he does report insomnia.  He has been on Ambien for many years.  He now takes Ambien at 10:00 at night.  He usually awakens about 12:00 midnight and then is unable to get back to sleep the remainder of the evening.  He would like to try something that could help him rest better at night.  He occasionally also uses Xanax for anxiety but he uses this sparingly.  Past Medical History:  Diagnosis Date   Acute diastolic heart failure (HCC)    Acute diastolic heart failure (HCC) 03/19/2019   Atrial fibrillation (HCC)    vs flutter   Atrial flutter (HCC)    Atrial tachycardia (HCC)    CAD (coronary artery disease)    a. PCI of prox LAD and Cx in 2004. b. PCI to Cx 2008. c. PCI to ISR of Cx in 2011.   Cardiomyopathy (HCC)    a. EF 45-50% in 06/2018.   Chronic respiratory failure with hypoxia, on home O2 therapy (HCC)    CKD (chronic kidney disease), stage III (HCC)    COPD (chronic obstructive pulmonary disease) (HCC)    Emphysematous bleb (HCC)    a.  s/p pleurectomy/pleurocentesis 2011   Hyperlipidemia    Hypertension    Hypothyroidism    Idiopathic pulmonary fibrosis (HCC)    On home oxygen therapy    "3L; 24/7" (04/15/2015)   Pneumothorax on right 4/11   Postinflammatory pulmonary fibrosis (HCC)    Sleep apnea    "won't use CPAP" (04/14/2105)   SVT (supraventricular tachycardia) (HCC)    Per Dr. Lubertha Basque note   Type II diabetes mellitus (HCC)    WPW  (Wolff-Parkinson-White syndrome)    Per Dr. Lubertha Basque Note   Past Surgical History:  Procedure Laterality Date   AV NODE ABLATION N/A 10/28/2018   Procedure: AV NODE ABLATION;  Surgeon: [name], MD;  Location: [facility] INVASIVE CV LAB;  Service: Cardiovascular;  Laterality: N/A;   BILATERAL VATS ABLATION Right    CARDIAC CATHETERIZATION  05/03/2007   patent stents   CARDIAC CATHETERIZATION N/A 05/18/2015   Procedure: Right Heart  Cath;  Surgeon: Lyn Records, MD;  Location: [facility] INVASIVE CV LAB;  Service: Cardiovascular;  Laterality: N/A;   CORONARY ANGIOPLASTY WITH STENT PLACEMENT  03/05/2003; 2008; 01/13/2010   PCI & Stent  LAD & mid CX; stent to CX  (I've got 4 stents total" (04/15/2015)   ELECTROPHYSIOLOGIC STUDY N/A 04/14/2015   Procedure: SVT Ablation;  Surgeon: [name], MD;  Location: [facility] INVASIVE CV LAB;  Service: Cardiovascular;  Laterality: N/A;   KNEE ARTHROSCOPY Right 01/22/2013   Procedure: RIGHT ARTHROSCOPY KNEE WITH DEBRIDEMENT, abrasion chondroplasty of lateral tibial plateau, debridement of partial tear of ACL, menisectomy;  Surgeon: Jacki Cones, MD;  Location: [facility] ORS;  Service: Orthopedics;  Laterality: Right;   KNEE SURGERY  2012   "reattached quad"   LUNG LOBECTOMY Right    PACEMAKER IMPLANT N/A 10/28/2018   Procedure: PACEMAKER IMPLANT;  Surgeon: [name], MD;  Location: [facility] INVASIVE CV LAB;  Service: Cardiovascular;  Laterality: N/A;   SHOULDER ARTHROSCOPY W/ ROTATOR CUFF REPAIR Bilateral    VIDEO ASSISTED THORACOSCOPY Left 07/19/2015   Procedure: LEFT VIDEO ASSISTED THORACOSCOPY WITH LEFT UPPER AND LOWER LUNG BIOPSY;  Surgeon: [name], MD;  Location: [facility] OR;  Service: Thoracic;  Laterality: Left;   VIDEO BRONCHOSCOPY N/A 07/19/2015   Procedure: VIDEO BRONCHOSCOPY WITH FLUORO;  Surgeon: [name], MD;  Location: [facility] OR;  Service: Thoracic;  Laterality: N/A;   Current Outpatient Medications on File Prior to Visit  Medication Sig  Dispense Refill   ALPRAZolam (XANAX) 0.5 MG tablet Take 1 tablet (0.5 mg total) by mouth at bedtime as needed for anxiety. 30 tablet 0   aspirin 81 MG tablet Take 81 mg by mouth every morning.      atorvastatin (LIPITOR) 40 MG tablet Take 1 tablet by mouth every night at bedtime 90 tablet 3   Cholecalciferol (VITAMIN D-3 PO) Take 1 capsule by mouth daily.     Cyanocobalamin (VITAMIN B-12 PO) Take 1 tablet by mouth daily.     dabigatran (PRADAXA) 150 MG CAPS capsule Take 150 mg by mouth 2 (two) times daily.     dapagliflozin propanediol (FARXIGA) 10 MG TABS tablet Take 10 mg by mouth daily. 30 tablet 0   diltiazem (CARDIZEM CD) 300 MG 24 hr capsule TAKE 1 CAPSULE BY MOUTH EVERY DAY (Patient taking differently: Take 300 mg by mouth daily. ) 90 capsule 2   furosemide (LASIX) 20 MG tablet Take 2 tablets (40 mg total) by mouth 2 (two) times daily. 90 tablet 1   insulin aspart (NOVOLOG) 100 UNIT/ML injection 15 UNITS WITH BREAKFAST AND 15 UNITS WITH LUNCH, 20 UNITS WITH SUPPER (Patient not taking: Reported on 04/01/2019) 60 mL 5   Investigational - Study Medication "Title: FGCL-3019-091 (FibroGen Study) is a Phase 3, randomized, double-blind, placebo-controlled multicenter international study to evaluate evaluate the efficacy and safety of 30 mg/kg IV infusions of pamrevlumab administered every 3 weeks for 52 weeks as compared to placebo in subjects with Idiopathic Pulmonary Fibrosis. Primary end point is: change in FVC from baseline at week 52"     LANTUS 100 UNIT/ML injection INJECT 0.6 MLS (60 UNITS TOTAL) INTO THE SKIN AT BEDTIME. (Patient not taking: No sig reported) 60 mL 3   levothyroxine (SYNTHROID) 125 MCG tablet Take 1 tablet (125 mcg total) by mouth daily. 90 tablet 1   LORazepam (ATIVAN) 0.5 MG tablet Take 1 tablet (0.5 mg total) by mouth every 8 (eight) hours as needed for anxiety. 30 tablet  1   MAGNESIUM PO Take 1 tablet by mouth daily.     Menthol, Topical Analgesic, (BIOFREEZE  EX) Apply 1 application topically as needed (for pain).     metFORMIN (GLUCOPHAGE) 850 MG tablet Take 1 tablet by mouth twice a day 180 tablet 1   metoprolol tartrate (LOPRESSOR) 100 MG tablet Take 100 mg by mouth 2 (two) times daily.     nitroGLYCERIN (NITROSTAT) 0.4 MG SL tablet Place 1 tablet (0.4 mg total) under the tongue every 5 (five) minutes as needed for chest pain. 25 tablet 1   omega-3 acid ethyl esters (LOVAZA) 1 g capsule Take 2 capsules (2 g total) by mouth 2 (two) times daily. 360 capsule 3   OXYGEN Inhale 6-8 L into the lungs continuous.     POTASSIUM PO Take 1 tablet by mouth daily.     zolpidem (AMBIEN) 10 MG tablet Take 1 tablet by mouth at bedtime. 90 tablet 1   No current facility-administered medications on file prior to visit.    Allergies  Allergen Reactions   Niaspan [Niacin] Itching   Ofev [Nintedanib] Other (See Comments)    "Lethargic and wiped out"   Pirfenidone Other (See Comments)    Caused the patient to unexpectedly wet the bed in the middle of the night (was never a problem prior to taking this med)   Ramipril Cough        Social History   Socioeconomic History   Marital status: Single    Spouse name: Not on file   Number of children: 1   Years of education: Not on file   Highest education level: Not on file  Occupational History    Employer: [employer]  Social Needs   Financial resource strain: Not hard at all   Food insecurity    Worry: Never true    Inability: Never true   Transportation needs    Medical: No    Non-medical: No  Tobacco Use   Smoking status: Former Smoker    Packs/day: 1.50    Years: 35.00    Pack years: 52.50    Types: Cigarettes    Quit date: 10/24/2007    Years since quitting: 11.6   Smokeless tobacco: Never Used  Substance and Sexual Activity   Alcohol use: Not Currently    Alcohol/week: 0.0 standard drinks   Drug use: No   Sexual activity: Not Currently  Lifestyle   Physical  activity    Days per week: 0 days    Minutes per session: 0 min   Stress: Not at all  Relationships   Social connections    Talks on phone: Once a week    Gets together: More than three times a week    Attends religious service: Never    Active member of club or organization: No    Attends meetings of clubs or organizations: Never    Relationship status: Not on file   Intimate partner violence    Fear of current or ex partner: Not on file    Emotionally abused: Not on file    Physically abused: Not on file    Forced sexual activity: Not on file  Other Topics Concern   Not on file  Social History Narrative   Not on file      Review of Systems  All other systems reviewed and are negative.      Objective:   Physical Exam Constitutional:      General: He is not in  acute distress.    Appearance: Normal appearance. He is not ill-appearing or toxic-appearing.  Cardiovascular:     Rate and Rhythm: Normal rate and regular rhythm.     Pulses: Normal pulses.     Heart sounds: Normal heart sounds.  Pulmonary:     Effort: No respiratory distress.     Breath sounds: No wheezing or rales.  Abdominal:     General: Abdomen is flat. Bowel sounds are normal.     Palpations: Abdomen is soft.  Musculoskeletal:     Right lower leg: Edema present.     Left lower leg: Edema present.  Neurological:     Mental Status: He is alert.           Assessment & Plan:  Controlled diabetes mellitus type 2 with complications, unspecified whether long term insulin use (HCC) - Plan: diazepam (VALIUM) 5 MG tablet, Hemoglobin A1c  Pulmonary fibrosis (HCC)  DOE (dyspnea on exertion)  Cor pulmonale (chronic) (HCC)  Primary insomnia  Patient is realistic about his long-term prognosis.  He realizes that his life expectancy is less than 6 to 12 months.  He wants to focus on quality of life.  Therefore we will discontinue Ambien and Xanax and try Valium 5 mg p.o. every 12 hours as needed  anxiety or insomnia.  Depending upon his response to this we may increase to 10 mg.  Patient would like to check a hemoglobin A1c today.  I explained to the patient that with his guarded prognosis, I would be perfectly willing to accept a hemoglobin A1c less than 9.  I believe the patient would benefit from the wheelchair.  This would facilitate him being able to enjoy life and not force him to be confined to his recliner at home.  Therefore we will try to get his wheelchair approved.

## 2019-06-20 LAB — HEMOGLOBIN A1C
Hgb A1c MFr Bld: 7.3 % of total Hgb — ABNORMAL HIGH (ref <5.7)
Mean Plasma Glucose: 163 (calc)
eAG (mmol/L): 9 (calc)

## 2019-06-20 NOTE — Addendum Note (Signed)
Addended by: Shary Decamp B on: 06/20/2019 08:49 AM   Modules accepted: Orders

## 2019-06-23 ENCOUNTER — Other Ambulatory Visit: Payer: Self-pay | Admitting: *Deleted

## 2019-06-23 DIAGNOSIS — Z006 Encounter for examination for normal comparison and control in clinical research program: Secondary | ICD-10-CM

## 2019-06-23 DIAGNOSIS — J84112 Idiopathic pulmonary fibrosis: Secondary | ICD-10-CM

## 2019-06-23 NOTE — Progress Notes (Signed)
HRCT 

## 2019-06-26 ENCOUNTER — Encounter: Payer: Self-pay | Admitting: *Deleted

## 2019-06-27 ENCOUNTER — Telehealth: Payer: Self-pay | Admitting: *Deleted

## 2019-06-27 ENCOUNTER — Encounter (HOSPITAL_COMMUNITY): Payer: PPO

## 2019-06-27 ENCOUNTER — Telehealth: Payer: Self-pay | Admitting: Family Medicine

## 2019-06-27 NOTE — Telephone Encounter (Signed)
I called patient's daughter Donella Stade and she states that he father is indeed experiencing SOB and did not feel like going in for research program. [facility] nurse went out to assess patient today and daughter stated that patient does not want to continue transfusion and would now like hospice to be called in. Patient was present when I talked with daughter and I can hear him verbalizing this in the background. Will forward this to [facility] at Pulmonology clinic.

## 2019-06-27 NOTE — Telephone Encounter (Signed)
You're welcome!

## 2019-06-27 NOTE — Telephone Encounter (Signed)
Thank you Larene Beach for following up on this patient!   I have documented in a separate telephone encounter note regarding the research visit that was scheduled for today and the patients request to stop participation in the study. We will proceed with the withdrawal process.   Thanks again,   Jon Billings, CMA, [facility]

## 2019-06-27 NOTE — Telephone Encounter (Signed)
Title: [id number] ([employer] Study) is a Phase 3, randomized, double-blind, placebo-controlled multicenter international study to evaluate evaluate the efficacy and safety of 30 mg/kg IV infusions of pamrevlumab administered every 3 weeks for 52 weeks as compared to placebo in subjects with Idiopathic Pulmonary Fibrosis. Primary end point is: change in FVC from baseline at week 52  Protocol #: [id number], Clinical Trials #: [id number] Sponsor: [email or web address]  ([place], [place], [place])  [name] was scheduled for Week 3 EX infusion visit for the above stated study today June 27, 2019 at 0900. I received a phone call at approximately 0800 from Jerry Montgomery, Jerry Montgomery spouse. She advised me that Jerry Montgomery was not going to be able to make his research infusion appointment today due to having increased SOB and "just cannot even get out of the bed." I thanked her for calling to let me know. I spoke with the subject yesterday afternoon to remind him of today's' appointment and he was at baseline at that time. I asked Jerry Montgomery when his increased SOB started and she said this morning. She stated the [facility] nurse completed an in home visit yesterday and Jerry Montgomery was not this SOB at that time. I advised Jerry Montgomery if she felt needed to call 911. Jerry Montgomery verbalized understanding of this but did not feel like it was necessary at this time. I also advised for her to contact the [facility] nurse and see if they can come out to check on Jerry Montgomery again today since there has been a change in his symptoms since yesterday. Jerry Montgomery verbalized understanding.   Following the call with Jerry Montgomery, I called Dr. Antony Contras office, PCP for the subject, and advised of Jerry Montgomery cancelling his appointment due to increased SOB. I asked that they contact the [facility] nurse to see if they can do another visit today to check on Jerry Montgomery.   Follow-up to above note, telephone encounters from Dr. Antony Contras'  office:  Jerry Grill, LPN   11/28/81 41:96 PM Note   I called patient's daughter Jerry Montgomery and she states that he father is indeed experiencing SOB and did not feel like going in for research program. [facility] nurse went out to assess patient today and daughter stated that patient does not want to continue transfusion and would now like hospice to be called in. Patient was present when I talked with daughter and I can hear him verbalizing this in the background. Will forward this to [facility] at Pulmonology clinic.      Jerry Montgomery L  06/27/19 2:00 PM Note   Jerry Montgomery called from Hospice she states she went out and seen patient today. She states that the patient is very short of breathe. The family is requesting we switch him to [place] they will need an order. They would also like to know if Dr. Dennard Schaumann would be the signing physician.   CB#  [phone]     I spoke with Dr. Chase Caller, PI, and reviewed all of the above. We will proceed with the withdrawal process from the trial as the subject requested.  Jerry Montgomery, CMA, CCRC3 [facility]

## 2019-06-27 NOTE — Telephone Encounter (Signed)
I called [facility] nurse Tammy and informed her that is ok to start Hospice and Dr. Dennard Schaumann will be attending. Tammy verbalized understanding.

## 2019-06-27 NOTE — Telephone Encounter (Signed)
Received a phone call from [place] with [place] stating that patient did not go for his clinical trial today with [facility] Pul[facility] as he was feeling SOB, and had no energy today. Tammy states that she spoke with him and his family at the home visit and patient stated that he would like to now go on hospital and would like to completely stop having the infusions done with the clinical trial. They would like to admit patient to their hospice program as patient is requesting it. They just need verbal orders.Also if you ok referral they would like to know if you will be the attending physician. Please advise?

## 2019-06-27 NOTE — Telephone Encounter (Signed)
I am ok with hospice order and will be attending.

## 2019-06-27 NOTE — Telephone Encounter (Signed)
Anderson Malta from Dr. Golden Pop, pulmonologist office called states that patient had an appointment this morning with the research program. She states that his wife called in and said that he needed to cancel this appointment that he was just to short of breathe to make the appointment. Anderson Malta states this is unlike Mr. Todaro to cancel the appointment. She did say that we had sent hospice out and wanted to know if maybe we could get them to go back out and check on him today. Anderson Malta said that the wife couldn't tell her much since she has memory loss.  CB# [phone]

## 2019-06-27 NOTE — Telephone Encounter (Signed)
Tammy called from Hospice she states she went out and seen patient today. She states that the patient is very short of breathe. The family is requesting we switch him to [place] they will need an order. They would also like to know if Dr. Dennard Schaumann would be the signing physician.   CB#  [phone]

## 2019-07-15 ENCOUNTER — Ambulatory Visit: Payer: PPO | Admitting: Cardiovascular Disease

## 2019-07-16 ENCOUNTER — Inpatient Hospital Stay: Admission: RE | Admit: 2019-07-16 | Payer: PPO | Source: Ambulatory Visit

## 2019-07-17 ENCOUNTER — Telehealth: Payer: Self-pay | Admitting: Internal Medicine

## 2019-07-23 ENCOUNTER — Other Ambulatory Visit: Payer: Self-pay | Admitting: Family Medicine

## 2019-07-30 ENCOUNTER — Ambulatory Visit (INDEPENDENT_AMBULATORY_CARE_PROVIDER_SITE_OTHER): Admitting: *Deleted

## 2019-07-30 DIAGNOSIS — I50813 Acute on chronic right heart failure: Secondary | ICD-10-CM

## 2019-07-30 DIAGNOSIS — I4891 Unspecified atrial fibrillation: Secondary | ICD-10-CM

## 2019-07-31 LAB — CUP PACEART REMOTE DEVICE CHECK
Battery Remaining Longevity: 132 mo
Battery Voltage: 3.02 V
Brady Statistic AP VP Percent: 38.85 %
Brady Statistic AP VS Percent: 0.18 %
Brady Statistic AS VP Percent: 56.97 %
Brady Statistic AS VS Percent: 3.78 %
Brady Statistic RA Percent Paced: 12.39 %
Brady Statistic RV Percent Paced: 93.68 %
Date Time Interrogation Session: 20201007000754
Implantable Lead Implant Date: 20200106
Implantable Lead Implant Date: 20200106
Implantable Lead Location: 753859
Implantable Lead Location: 753860
Implantable Lead Model: 3830
Implantable Lead Model: 5076
Implantable Pulse Generator Implant Date: 20200106
Lead Channel Impedance Value: 323 Ohm
Lead Channel Impedance Value: 361 Ohm
Lead Channel Impedance Value: 475 Ohm
Lead Channel Impedance Value: 627 Ohm
Lead Channel Pacing Threshold Amplitude: 0.625 V
Lead Channel Pacing Threshold Amplitude: 0.875 V
Lead Channel Pacing Threshold Pulse Width: 0.4 ms
Lead Channel Pacing Threshold Pulse Width: 0.4 ms
Lead Channel Sensing Intrinsic Amplitude: 4.375 mV
Lead Channel Sensing Intrinsic Amplitude: 4.375 mV
Lead Channel Sensing Intrinsic Amplitude: 6.375 mV
Lead Channel Sensing Intrinsic Amplitude: 6.375 mV
Lead Channel Setting Pacing Amplitude: 2 V
Lead Channel Setting Pacing Amplitude: 2.5 V
Lead Channel Setting Pacing Pulse Width: 0.4 ms
Lead Channel Setting Sensing Sensitivity: 2 mV

## 2019-08-08 NOTE — Progress Notes (Signed)
Remote pacemaker transmission.   

## 2019-08-12 ENCOUNTER — Other Ambulatory Visit: Payer: Self-pay | Admitting: *Deleted

## 2019-08-24 DEATH — deceased

## 2020-02-22 IMAGING — DX DG CHEST 2V
2 series · 2 of 2 positions shown · non-contrast
Comparison: 07/11/2018

CLINICAL DATA: Chest pain tachycardia

EXAM:
CHEST - 2 VIEW

[x chest ap]
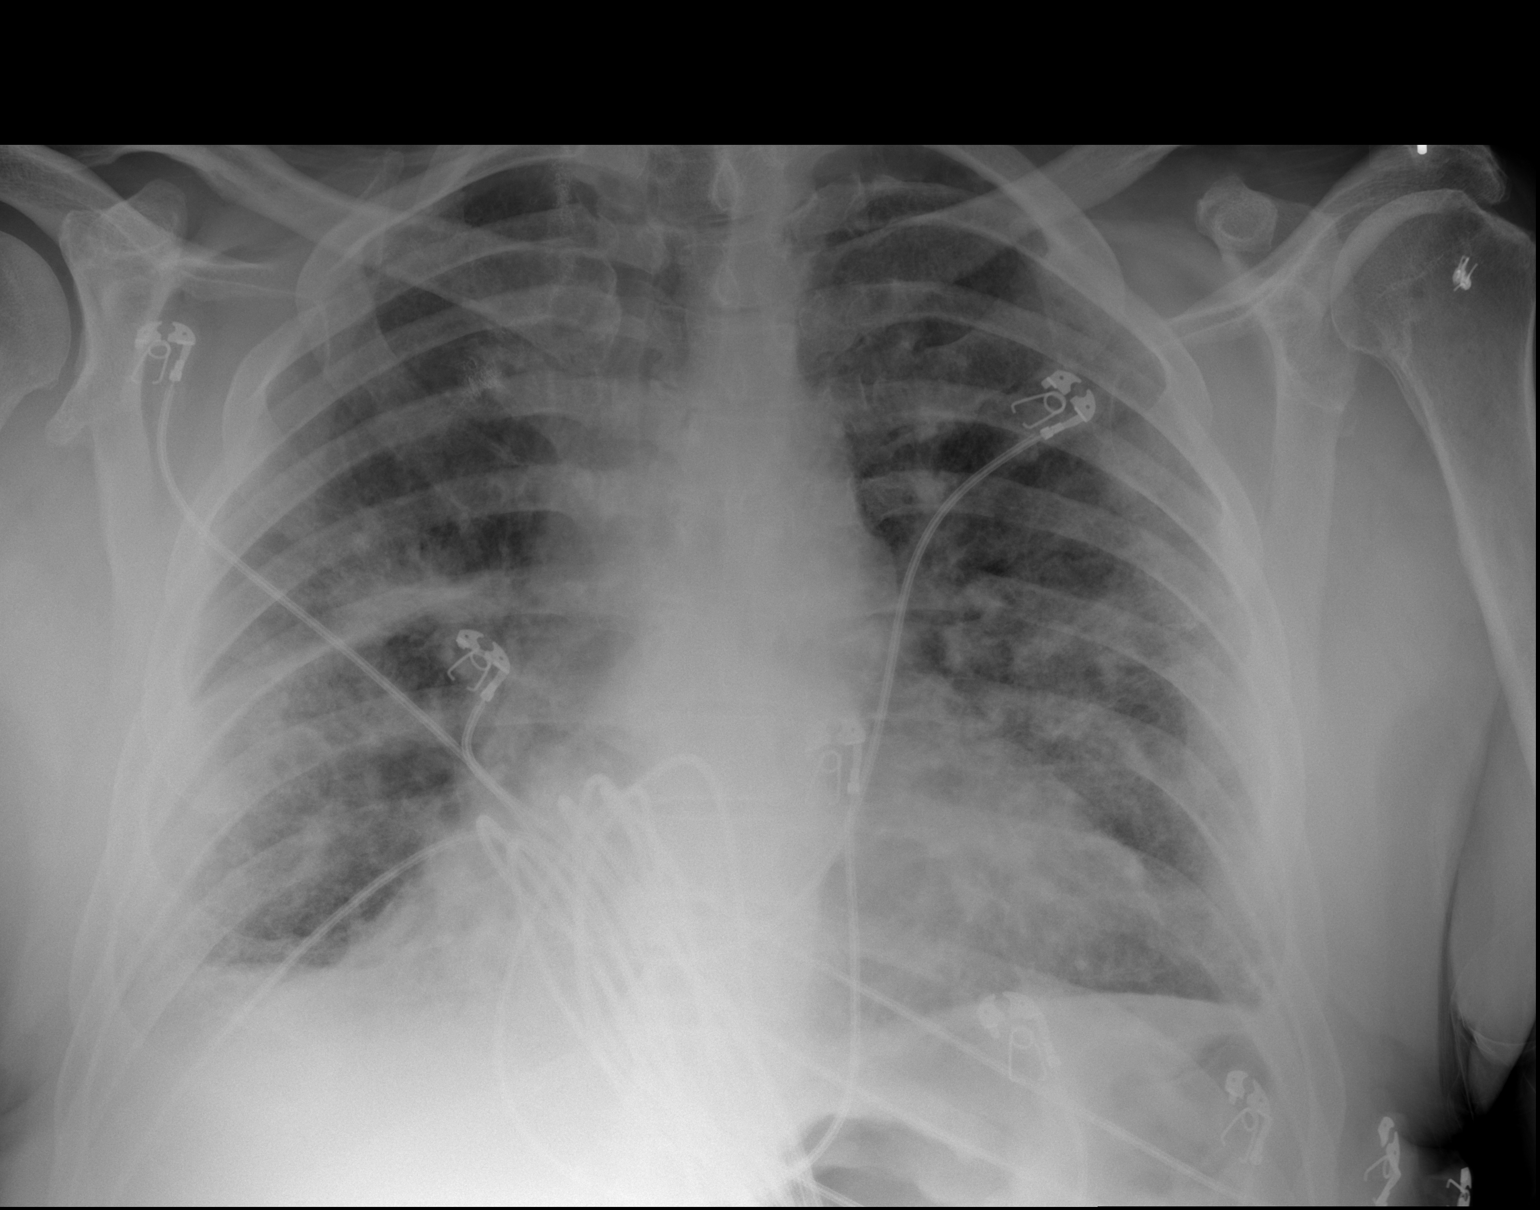

[w chest lat]
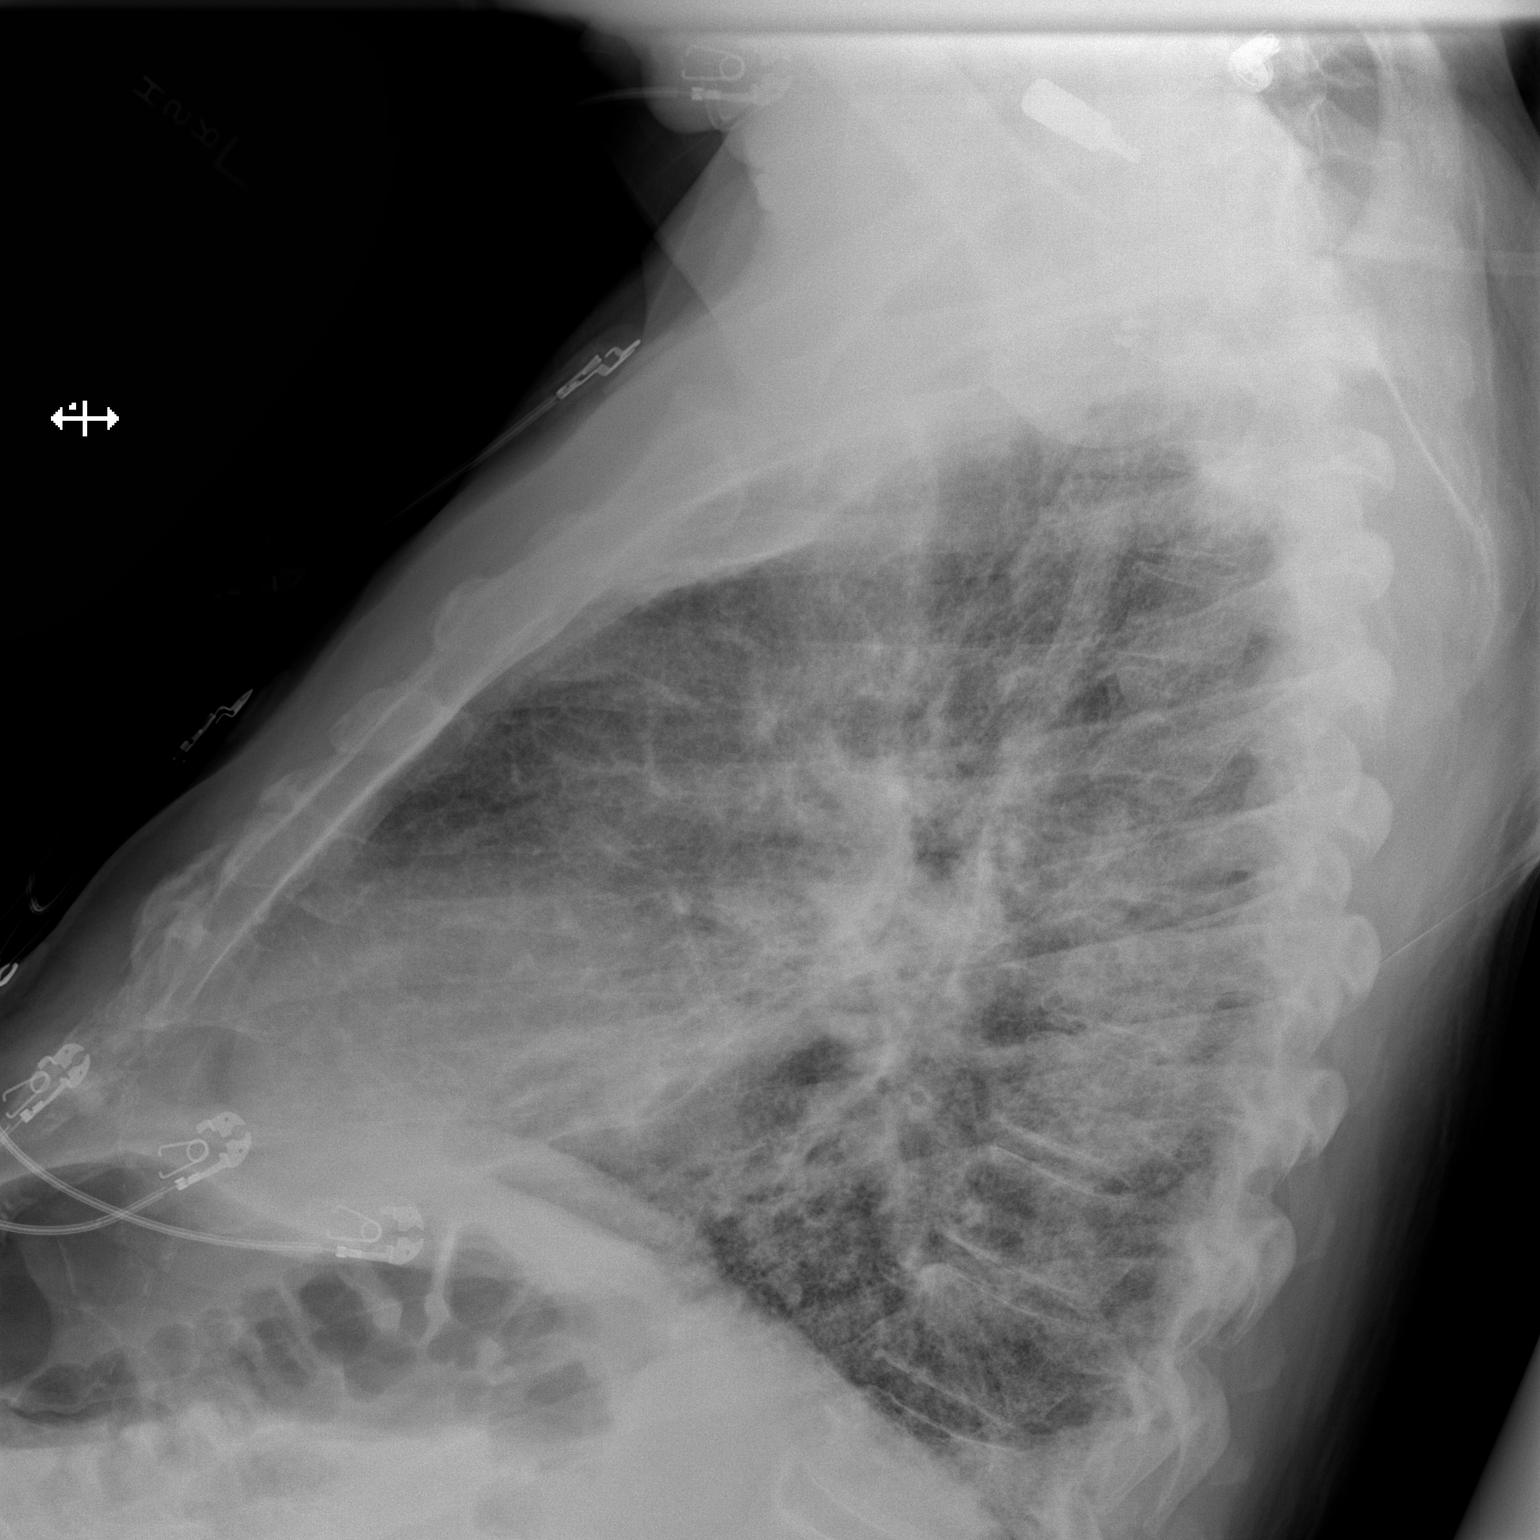

[2 of 2 positions shown; findings below may reference images not displayed]

FINDINGS: Prominent lung markings are present bilaterally right greater than
left mostly in the bases. These have been present on prior studies
including chest CT of 05/17/2018 consistent with fibrosis.

Negative for superimposed acute infiltrate or effusion.  No edema.
IMPRESSION: Chronic lung disease with interstitial fibrosis similar to prior
studies. No superimposed acute abnormality.
